# Patient Record
Sex: Female | Born: 1935 | Race: White | Hispanic: No | Smoking: Former smoker
Health system: Southern US, Community
[De-identification: ages and names within clinical notes are randomized; demographics above are authoritative.]

## PROBLEM LIST (undated history)

## (undated) DIAGNOSIS — I2510 Atherosclerotic heart disease of native coronary artery without angina pectoris: Secondary | ICD-10-CM

## (undated) DIAGNOSIS — H548 Legal blindness, as defined in USA: Secondary | ICD-10-CM

## (undated) DIAGNOSIS — J449 Chronic obstructive pulmonary disease, unspecified: Secondary | ICD-10-CM

## (undated) DIAGNOSIS — I252 Old myocardial infarction: Secondary | ICD-10-CM

## (undated) DIAGNOSIS — K7460 Unspecified cirrhosis of liver: Secondary | ICD-10-CM

## (undated) DIAGNOSIS — E785 Hyperlipidemia, unspecified: Secondary | ICD-10-CM

## (undated) DIAGNOSIS — G4730 Sleep apnea, unspecified: Secondary | ICD-10-CM

## (undated) DIAGNOSIS — R600 Localized edema: Secondary | ICD-10-CM

## (undated) DIAGNOSIS — E669 Obesity, unspecified: Secondary | ICD-10-CM

## (undated) DIAGNOSIS — W19XXXA Unspecified fall, initial encounter: Secondary | ICD-10-CM

## (undated) DIAGNOSIS — G5690 Unspecified mononeuropathy of unspecified upper limb: Secondary | ICD-10-CM

## (undated) DIAGNOSIS — E039 Hypothyroidism, unspecified: Secondary | ICD-10-CM

## (undated) HISTORY — PX: TUBAL LIGATION: SHX77

## (undated) HISTORY — PX: CARDIAC CATHETERIZATION: SHX172

## (undated) HISTORY — DX: Unspecified fall, initial encounter: W19.XXXA

## (undated) HISTORY — DX: Hyperlipidemia, unspecified: E78.5

## (undated) HISTORY — DX: Hypothyroidism, unspecified: E03.9

## (undated) HISTORY — DX: Old myocardial infarction: I25.2

## (undated) HISTORY — DX: Atherosclerotic heart disease of native coronary artery without angina pectoris: I25.10

## (undated) HISTORY — DX: Legal blindness, as defined in USA: H54.8

## (undated) HISTORY — PX: CHOLECYSTECTOMY: SHX55

## (undated) HISTORY — DX: Obesity, unspecified: E66.9

## (undated) HISTORY — DX: Sleep apnea, unspecified: G47.30

## (undated) HISTORY — DX: Localized edema: R60.0

## (undated) HISTORY — PX: CORONARY STENT PLACEMENT: SHX1402

## (undated) HISTORY — PX: WRIST SURGERY: SHX841

## (undated) HISTORY — DX: Unspecified mononeuropathy of unspecified upper limb: G56.90

## (undated) HISTORY — PX: ABDOMINAL HYSTERECTOMY: SHX81

---

## 1999-01-30 ENCOUNTER — Encounter: Payer: Self-pay | Admitting: Emergency Medicine

## 1999-01-30 ENCOUNTER — Inpatient Hospital Stay (HOSPITAL_COMMUNITY): Admission: EM | Admit: 1999-01-30 | Discharge: 1999-01-31 | Payer: Self-pay | Admitting: Emergency Medicine

## 1999-03-24 ENCOUNTER — Encounter (INDEPENDENT_AMBULATORY_CARE_PROVIDER_SITE_OTHER): Payer: Self-pay | Admitting: Specialist

## 1999-03-24 ENCOUNTER — Ambulatory Visit (HOSPITAL_COMMUNITY): Admission: RE | Admit: 1999-03-24 | Discharge: 1999-03-24 | Payer: Self-pay | Admitting: Gastroenterology

## 1999-03-29 ENCOUNTER — Ambulatory Visit (HOSPITAL_COMMUNITY): Admission: RE | Admit: 1999-03-29 | Discharge: 1999-03-29 | Payer: Self-pay | Admitting: Family Medicine

## 1999-03-29 ENCOUNTER — Encounter: Payer: Self-pay | Admitting: Family Medicine

## 1999-04-21 ENCOUNTER — Ambulatory Visit (HOSPITAL_COMMUNITY): Admission: RE | Admit: 1999-04-21 | Discharge: 1999-04-21 | Payer: Self-pay | Admitting: Gastroenterology

## 1999-04-21 ENCOUNTER — Encounter (INDEPENDENT_AMBULATORY_CARE_PROVIDER_SITE_OTHER): Payer: Self-pay

## 1999-05-12 ENCOUNTER — Encounter: Payer: Self-pay | Admitting: Family Medicine

## 1999-05-12 ENCOUNTER — Ambulatory Visit (HOSPITAL_COMMUNITY): Admission: RE | Admit: 1999-05-12 | Discharge: 1999-05-12 | Payer: Self-pay | Admitting: Family Medicine

## 2000-08-05 ENCOUNTER — Observation Stay (HOSPITAL_COMMUNITY): Admission: EM | Admit: 2000-08-05 | Discharge: 2000-08-05 | Payer: Self-pay | Admitting: Emergency Medicine

## 2000-08-05 ENCOUNTER — Encounter: Payer: Self-pay | Admitting: Cardiovascular Disease

## 2000-08-06 ENCOUNTER — Inpatient Hospital Stay (HOSPITAL_COMMUNITY): Admission: EM | Admit: 2000-08-06 | Discharge: 2000-08-08 | Payer: Self-pay | Admitting: Emergency Medicine

## 2000-08-06 ENCOUNTER — Encounter: Payer: Self-pay | Admitting: Emergency Medicine

## 2002-03-12 ENCOUNTER — Encounter: Payer: Self-pay | Admitting: Family Medicine

## 2002-03-12 ENCOUNTER — Encounter: Admission: RE | Admit: 2002-03-12 | Discharge: 2002-03-12 | Payer: Self-pay | Admitting: Family Medicine

## 2002-10-13 ENCOUNTER — Encounter: Payer: Self-pay | Admitting: Gastroenterology

## 2002-10-13 ENCOUNTER — Ambulatory Visit (HOSPITAL_COMMUNITY): Admission: RE | Admit: 2002-10-13 | Discharge: 2002-10-13 | Payer: Self-pay | Admitting: Gastroenterology

## 2002-11-12 ENCOUNTER — Other Ambulatory Visit: Admission: RE | Admit: 2002-11-12 | Discharge: 2002-11-12 | Payer: Self-pay | Admitting: Obstetrics and Gynecology

## 2002-12-22 ENCOUNTER — Ambulatory Visit (HOSPITAL_COMMUNITY): Admission: RE | Admit: 2002-12-22 | Discharge: 2002-12-22 | Payer: Self-pay | Admitting: Cardiovascular Disease

## 2004-11-13 ENCOUNTER — Emergency Department (HOSPITAL_COMMUNITY): Admission: EM | Admit: 2004-11-13 | Discharge: 2004-11-13 | Payer: Self-pay | Admitting: Emergency Medicine

## 2004-12-07 ENCOUNTER — Other Ambulatory Visit: Admission: RE | Admit: 2004-12-07 | Discharge: 2004-12-07 | Payer: Self-pay | Admitting: Obstetrics and Gynecology

## 2005-01-11 ENCOUNTER — Ambulatory Visit (HOSPITAL_COMMUNITY): Admission: RE | Admit: 2005-01-11 | Discharge: 2005-01-11 | Payer: Self-pay | Admitting: Endocrinology

## 2005-05-22 ENCOUNTER — Inpatient Hospital Stay (HOSPITAL_COMMUNITY): Admission: EM | Admit: 2005-05-22 | Discharge: 2005-05-23 | Payer: Self-pay | Admitting: Emergency Medicine

## 2005-10-09 ENCOUNTER — Ambulatory Visit: Payer: Self-pay | Admitting: Pulmonary Disease

## 2006-01-16 ENCOUNTER — Observation Stay (HOSPITAL_COMMUNITY): Admission: EM | Admit: 2006-01-16 | Discharge: 2006-01-17 | Payer: Self-pay | Admitting: Emergency Medicine

## 2006-04-03 DIAGNOSIS — I708 Atherosclerosis of other arteries: Secondary | ICD-10-CM | POA: Insufficient documentation

## 2006-07-05 ENCOUNTER — Encounter: Admission: RE | Admit: 2006-07-05 | Discharge: 2006-07-05 | Payer: Self-pay | Admitting: Family Medicine

## 2007-11-03 ENCOUNTER — Observation Stay (HOSPITAL_COMMUNITY): Admission: EM | Admit: 2007-11-03 | Discharge: 2007-11-04 | Payer: Self-pay | Admitting: Emergency Medicine

## 2007-12-31 ENCOUNTER — Ambulatory Visit (HOSPITAL_COMMUNITY)
Admission: RE | Admit: 2007-12-31 | Discharge: 2007-12-31 | Payer: Self-pay | Admitting: Physical Medicine and Rehabilitation

## 2008-03-04 ENCOUNTER — Ambulatory Visit: Payer: Self-pay | Admitting: Internal Medicine

## 2008-03-04 ENCOUNTER — Observation Stay (HOSPITAL_COMMUNITY): Admission: EM | Admit: 2008-03-04 | Discharge: 2008-03-06 | Payer: Self-pay | Admitting: Emergency Medicine

## 2008-03-29 ENCOUNTER — Ambulatory Visit: Payer: Self-pay | Admitting: Pulmonary Disease

## 2008-03-29 DIAGNOSIS — I5042 Chronic combined systolic (congestive) and diastolic (congestive) heart failure: Secondary | ICD-10-CM

## 2008-03-29 DIAGNOSIS — I5032 Chronic diastolic (congestive) heart failure: Secondary | ICD-10-CM | POA: Insufficient documentation

## 2008-04-06 ENCOUNTER — Encounter: Payer: Self-pay | Admitting: Pulmonary Disease

## 2008-04-06 ENCOUNTER — Ambulatory Visit: Admission: RE | Admit: 2008-04-06 | Discharge: 2008-04-06 | Payer: Self-pay | Admitting: Pulmonary Disease

## 2008-04-07 ENCOUNTER — Telehealth: Payer: Self-pay | Admitting: Pulmonary Disease

## 2008-08-04 ENCOUNTER — Telehealth (INDEPENDENT_AMBULATORY_CARE_PROVIDER_SITE_OTHER): Payer: Self-pay | Admitting: *Deleted

## 2008-08-20 ENCOUNTER — Ambulatory Visit: Payer: Self-pay | Admitting: Cardiology

## 2008-08-21 ENCOUNTER — Inpatient Hospital Stay (HOSPITAL_COMMUNITY): Admission: EM | Admit: 2008-08-21 | Discharge: 2008-08-25 | Payer: Self-pay | Admitting: Emergency Medicine

## 2008-08-23 ENCOUNTER — Encounter (INDEPENDENT_AMBULATORY_CARE_PROVIDER_SITE_OTHER): Payer: Self-pay | Admitting: Internal Medicine

## 2009-05-10 ENCOUNTER — Inpatient Hospital Stay (HOSPITAL_BASED_OUTPATIENT_CLINIC_OR_DEPARTMENT_OTHER): Admission: RE | Admit: 2009-05-10 | Discharge: 2009-05-10 | Payer: Self-pay | Admitting: Cardiovascular Disease

## 2009-11-04 ENCOUNTER — Encounter: Admission: RE | Admit: 2009-11-04 | Discharge: 2009-11-04 | Payer: Self-pay | Admitting: Family Medicine

## 2010-03-04 ENCOUNTER — Inpatient Hospital Stay (HOSPITAL_COMMUNITY): Admission: EM | Admit: 2010-03-04 | Discharge: 2010-03-07 | Payer: Self-pay | Admitting: Emergency Medicine

## 2010-08-02 ENCOUNTER — Ambulatory Visit: Payer: Self-pay | Admitting: Cardiovascular Disease

## 2010-09-10 ENCOUNTER — Encounter: Payer: Self-pay | Admitting: Family Medicine

## 2010-09-21 NOTE — Progress Notes (Signed)
Summary: results  Phone Note Call from Patient Call back at [phone]   Caller: Daughter teresa Call For: clance Summary of Call: need results of pft  Initial call taken by: Gustavus Bryant,  April 07, 2008 3:52 PM  Follow-up for Phone Call        please let daughter know the breathing studies showed no asthma or copd, and that her lungs are just a little smaller than normal because of her weight/obesity.  There is no intervention from a pulmonary standpoint to be done, and her mother needs close monitoring of her fluid. Follow-up by: Kathee Delton MD,  April 07, 2008 7:55 PM  Additional Follow-up for Phone Call Additional follow up Details #1::        spoke with daughter and relayed answer from Dr. Gwenette Greet - daughter understands and will take mother to primary doctor for fluid monitoring Jim Like  April 08, 2008 9:23 AM

## 2010-09-21 NOTE — Progress Notes (Signed)
Summary: fax result/ pft immediately- pt there now  Phone Note From Other Clinic   Caller: andrea at [facility] Call For: clance Summary of Call: need pft results faxed immediately. pt is there now. fax # [phone]. she is faxing a release form now (if this is needed) to the [phone] #.  Initial call taken by: Tivis Ringer,  August 04, 2008 10:35 AM  Follow-up for Phone Call        Faxed pft's to the fax number provided. Follow-up by: Vernie Murders,  August 04, 2008 10:49 AM

## 2010-09-21 NOTE — Assessment & Plan Note (Signed)
Summary: sick visit for dyspnea   Chief Complaint:  Pt is here for a sick visit.  Pt c/o increased sob with activity and at rest, sneezing, eyes are red, watery, and and itchy.  Symptoms started approx 3 weeks ago.  Pt was recently hospitalized at [facility]. Marland Kitchen  History of Present Illness: the patient is a 77 year old female who I have seen in the distant past for cough secondary to upper airway dysfunction. Her cough definitely improved with discontinuation of ACE inhibitor, treating reflux disease more aggressively, and adding a nonsedating antihistamine for her rhinitis. I have not seen the patient in over 2 years, and she comes in today for followup after a recent hospitalization for congestive heart failure. She was admitted for dyspnea and found to have cardiomegaly with small bilateral effusions. She improved significantly with diuresis. Unfortunately, the patient states that her weight has increased 6 pounds over the last 3-4 days. She also notes worsening lower extremity edema. I have been asked to see the patient in followup to decide whether she has a pulmonary process that is contributing to her dyspnea. The question has been raised whether she may have asthma, and she currently is on Advair for the last 2 weeks.     Current Allergies: ! PENICILLIN ! SULFA    Risk Factors:  Tobacco use:  never   Review of Systems      See HPI   Vital Signs:  Patient Profile:   77 Years Old Female Weight:      206.25 pounds O2 Sat:      95 % O2 treatment:    Room Air Temp:     97.6 degrees F oral Pulse rate:   65 / minute BP sitting:   128 / 68  (left arm) Cuff size:   regular  Vitals Entered By: Valrie Hart LPN (March 29, 8920 3:25 PM)             Is Patient Diabetic? Yes  Comments .Medications reviewed with patient Valrie Hart LPN  March 29, 1940 3:33 PM      Physical Exam  General:     obese female in no acute distress Lungs:     totally clear to auscultation  except decreased breath sounds Heart:     regular rate and rhythm, 2/6 systolic murmur Extremities:     1+ edema bilaterally      Impression & Recommendations:  Problem # 1:  DYSPNEA (ICD-786.05) I suspect the majority of the patient's dyspnea is due to congestive heart failure, obesity, and deconditioning. She has even gained 6 pounds over a very short period of time, and it appears mostly to be fluid. I've asked her to contact her primary care physician for treatment. I have reviewed her CT scan of her chest, and there is really no evidence for interstitial disease, but there does appear to be curly B. lines associated with edema and bilateral pleural effusions. I think it is also very unlikely that she has asthma, and I would like to discontinue her Advair and do full pulmonary function studies in the next one to 2 weeks. I've encouraged the patient to work aggressively on weight loss.  Medications Added to Medication List This Visit: 1)  Metformin Hcl 500 Mg Tabs (Metformin hcl) .... Take 2 tabs by mouth two times a day 2)  Klor-con M10 10 Meq Cr-tabs (Potassium chloride crys cr) .... Take 1 tab by mouth once daily 3)  Furosemide 40 Mg Tabs (Furosemide) .Marland KitchenMarland KitchenMarland Kitchen  Take 1 tablet by mouth once a day 4)  Metoprolol Tartrate 50 Mg Tabs (Metoprolol tartrate) .... Take 1/2 tab by mouth two times a day 5)  Nitroglycerin 0.4 Mg Subl (Nitroglycerin) .... Take as directed as needed 6)  Isosorbide Mononitrate Cr 30 Mg Xr24h-tab (Isosorbide mononitrate) .... Take 1 tablet by mouth once a day 7)  Aspirin Low Dose 81 Mg Tabs (Aspirin) .... Take 1 tablet by mouth once a day 8)  Lipitor 20 Mg Tabs (Atorvastatin calcium) .... Take 1 tablet by mouth once a day 9)  Lexapro 10 Mg Tabs (Escitalopram oxalate) .... Take 1 tablet by mouth once a day 10)  Fexofenadine Hcl 180 Mg Tabs (Fexofenadine hcl) .... Take 1 tablet by mouth once a day 11)  Detrol La 4 Mg Xr24h-cap (Tolterodine tartrate) .... Take 1 tablet by  mouth once a day 12)  Carvedilol 12.5 Mg Tabs (Carvedilol) .... Take 1 tablet by mouth two times a day 13)  Levothyroxine Sodium 125 Mcg Tabs (Levothyroxine sodium) .... Take 1 tablet by mouth once a day 14)  Advair Diskus 250-50 Mcg/dose Misc (Fluticasone-salmeterol) .... Inhale 1 puff two times a day 15)  Albuterol Sulfate (2.5 Mg/32m) 0.083% Nebu (Albuterol sulfate) .... Use 1 vial in nebulizer every 6 hours 16)  Lantus 100 Unit/ml Soln (Insulin glargine) .... Take as directed 17)  Novolog 100 Unit/ml Soln (Insulin aspart) .... Take as directed   Patient Instructions: 1)  stop advair now 2)  can use albuterol only if needed 3)  will schedule for breathing studies 4)  I will contact you when results available 5)  work on weight loss 6)  contact you primary md about fluid weight gain.   ]

## 2010-11-04 LAB — CK TOTAL AND CKMB (NOT AT ARMC)
CK, MB: 1.4 ng/mL (ref 0.3–4.0)
Relative Index: INVALID (ref 0.0–2.5)
Total CK: 34 U/L (ref 7–177)

## 2010-11-04 LAB — GLUCOSE, CAPILLARY
Glucose-Capillary: 100 mg/dL — ABNORMAL HIGH (ref 70–99)
Glucose-Capillary: 110 mg/dL — ABNORMAL HIGH (ref 70–99)
Glucose-Capillary: 110 mg/dL — ABNORMAL HIGH (ref 70–99)
Glucose-Capillary: 122 mg/dL — ABNORMAL HIGH (ref 70–99)
Glucose-Capillary: 132 mg/dL — ABNORMAL HIGH (ref 70–99)
Glucose-Capillary: 140 mg/dL — ABNORMAL HIGH (ref 70–99)
Glucose-Capillary: 145 mg/dL — ABNORMAL HIGH (ref 70–99)
Glucose-Capillary: 147 mg/dL — ABNORMAL HIGH (ref 70–99)
Glucose-Capillary: 161 mg/dL — ABNORMAL HIGH (ref 70–99)
Glucose-Capillary: 165 mg/dL — ABNORMAL HIGH (ref 70–99)
Glucose-Capillary: 182 mg/dL — ABNORMAL HIGH (ref 70–99)
Glucose-Capillary: 191 mg/dL — ABNORMAL HIGH (ref 70–99)
Glucose-Capillary: 255 mg/dL — ABNORMAL HIGH (ref 70–99)

## 2010-11-04 LAB — DIFFERENTIAL
Basophils Absolute: 0 10*3/uL (ref 0.0–0.1)
Basophils Relative: 0 % (ref 0–1)
Eosinophils Absolute: 0.1 10*3/uL (ref 0.0–0.7)
Eosinophils Relative: 2 % (ref 0–5)
Lymphocytes Relative: 11 % — ABNORMAL LOW (ref 12–46)
Lymphs Abs: 0.9 10*3/uL (ref 0.7–4.0)
Monocytes Absolute: 0.7 10*3/uL (ref 0.1–1.0)
Monocytes Relative: 8 % (ref 3–12)
Neutro Abs: 6.8 10*3/uL (ref 1.7–7.7)
Neutrophils Relative %: 79 % — ABNORMAL HIGH (ref 43–77)

## 2010-11-04 LAB — CARDIAC PANEL(CRET KIN+CKTOT+MB+TROPI)
CK, MB: 1.4 ng/mL (ref 0.3–4.0)
CK, MB: 1.7 ng/mL (ref 0.3–4.0)
Relative Index: INVALID (ref 0.0–2.5)
Relative Index: INVALID (ref 0.0–2.5)
Total CK: 34 U/L (ref 7–177)
Total CK: 41 U/L (ref 7–177)
Troponin I: 0.01 ng/mL (ref 0.00–0.06)
Troponin I: 0.02 ng/mL (ref 0.00–0.06)

## 2010-11-04 LAB — BASIC METABOLIC PANEL
BUN: 7 mg/dL (ref 6–23)
CO2: 28 mEq/L (ref 19–32)
Calcium: 9.5 mg/dL (ref 8.4–10.5)
Chloride: 101 mEq/L (ref 96–112)
Creatinine, Ser: 0.86 mg/dL (ref 0.4–1.2)
GFR calc Af Amer: >60 mL/min (ref >60)
GFR calc non Af Amer: >60 mL/min (ref >60)
Glucose, Bld: 193 mg/dL — ABNORMAL HIGH (ref 70–99)
Potassium: 4.1 mEq/L (ref 3.5–5.1)
Sodium: 138 mEq/L (ref 135–145)

## 2010-11-04 LAB — PROTIME-INR
INR: 1.02 (ref 0.00–1.49)
Prothrombin Time: 13.3 seconds (ref 11.6–15.2)

## 2010-11-04 LAB — CBC
HCT: 42 % (ref 36.0–46.0)
Hemoglobin: 14.2 g/dL (ref 12.0–15.0)
MCH: 29.7 pg (ref 26.0–34.0)
MCHC: 33.9 g/dL (ref 30.0–36.0)
MCV: 87.6 fL (ref 78.0–100.0)
Platelets: 103 10*3/uL — ABNORMAL LOW (ref 150–400)
RBC: 4.79 MIL/uL (ref 3.87–5.11)
RDW: 16.5 % — ABNORMAL HIGH (ref 11.5–15.5)
WBC: 8.6 10*3/uL (ref 4.0–10.5)

## 2010-11-04 LAB — MRSA PCR SCREENING: MRSA by PCR: NEGATIVE

## 2010-11-04 LAB — APTT: aPTT: 41 seconds — ABNORMAL HIGH (ref 24–37)

## 2010-11-04 LAB — TROPONIN I: Troponin I: 0.03 ng/mL (ref 0.00–0.06)

## 2010-11-24 LAB — POCT I-STAT GLUCOSE
Glucose, Bld: 144 mg/dL — ABNORMAL HIGH (ref 70–99)
Operator id: 221371

## 2010-11-30 ENCOUNTER — Other Ambulatory Visit: Payer: Self-pay | Admitting: *Deleted

## 2010-11-30 MED ORDER — CARVEDILOL 12.5 MG PO TABS: 12.5000 mg | ORAL_TABLET | Freq: Two times a day (BID) | ORAL | Status: DC

## 2010-12-04 LAB — COMPREHENSIVE METABOLIC PANEL
ALT: 34 U/L (ref 0–35)
ALT: 34 U/L (ref 0–35)
ALT: 36 U/L — ABNORMAL HIGH (ref 0–35)
ALT: 42 U/L — ABNORMAL HIGH (ref 0–35)
AST: 24 U/L (ref 0–37)
AST: 35 U/L (ref 0–37)
AST: 36 U/L (ref 0–37)
AST: 43 U/L — ABNORMAL HIGH (ref 0–37)
Albumin: 3.3 g/dL — ABNORMAL LOW (ref 3.5–5.2)
Albumin: 3.5 g/dL (ref 3.5–5.2)
Albumin: 3.5 g/dL (ref 3.5–5.2)
Albumin: 3.5 g/dL (ref 3.5–5.2)
Alkaline Phosphatase: 228 U/L — ABNORMAL HIGH (ref 39–117)
Alkaline Phosphatase: 228 U/L — ABNORMAL HIGH (ref 39–117)
Alkaline Phosphatase: 239 U/L — ABNORMAL HIGH (ref 39–117)
Alkaline Phosphatase: 268 U/L — ABNORMAL HIGH (ref 39–117)
BUN: 12 mg/dL (ref 6–23)
BUN: 20 mg/dL (ref 6–23)
BUN: 22 mg/dL (ref 6–23)
BUN: 26 mg/dL — ABNORMAL HIGH (ref 6–23)
CO2: 27 mEq/L (ref 19–32)
CO2: 28 mEq/L (ref 19–32)
CO2: 29 mEq/L (ref 19–32)
CO2: 30 mEq/L (ref 19–32)
Calcium: 8.4 mg/dL (ref 8.4–10.5)
Calcium: 8.9 mg/dL (ref 8.4–10.5)
Calcium: 9.1 mg/dL (ref 8.4–10.5)
Calcium: 9.1 mg/dL (ref 8.4–10.5)
Chloride: 101 mEq/L (ref 96–112)
Chloride: 93 mEq/L — ABNORMAL LOW (ref 96–112)
Chloride: 95 mEq/L — ABNORMAL LOW (ref 96–112)
Chloride: 98 mEq/L (ref 96–112)
Creatinine, Ser: 0.9 mg/dL (ref 0.4–1.2)
Creatinine, Ser: 0.98 mg/dL (ref 0.4–1.2)
Creatinine, Ser: 1 mg/dL (ref 0.4–1.2)
Creatinine, Ser: 1.13 mg/dL (ref 0.4–1.2)
GFR calc Af Amer: 57 mL/min — ABNORMAL LOW (ref >60)
GFR calc Af Amer: >60 mL/min (ref >60)
GFR calc Af Amer: >60 mL/min (ref >60)
GFR calc Af Amer: >60 mL/min (ref >60)
GFR calc non Af Amer: 47 mL/min — ABNORMAL LOW (ref >60)
GFR calc non Af Amer: 55 mL/min — ABNORMAL LOW (ref >60)
GFR calc non Af Amer: 56 mL/min — ABNORMAL LOW (ref >60)
GFR calc non Af Amer: >60 mL/min (ref >60)
Glucose, Bld: 232 mg/dL — ABNORMAL HIGH (ref 70–99)
Glucose, Bld: 252 mg/dL — ABNORMAL HIGH (ref 70–99)
Glucose, Bld: 256 mg/dL — ABNORMAL HIGH (ref 70–99)
Glucose, Bld: 371 mg/dL — ABNORMAL HIGH (ref 70–99)
Potassium: 3.4 mEq/L — ABNORMAL LOW (ref 3.5–5.1)
Potassium: 3.7 mEq/L (ref 3.5–5.1)
Potassium: 3.8 mEq/L (ref 3.5–5.1)
Potassium: 4.4 mEq/L (ref 3.5–5.1)
Sodium: 134 mEq/L — ABNORMAL LOW (ref 135–145)
Sodium: 134 mEq/L — ABNORMAL LOW (ref 135–145)
Sodium: 135 mEq/L (ref 135–145)
Sodium: 136 mEq/L (ref 135–145)
Total Bilirubin: 0.7 mg/dL (ref 0.3–1.2)
Total Bilirubin: 1 mg/dL (ref 0.3–1.2)
Total Bilirubin: 1 mg/dL (ref 0.3–1.2)
Total Bilirubin: 1.3 mg/dL — ABNORMAL HIGH (ref 0.3–1.2)
Total Protein: 7.1 g/dL (ref 6.0–8.3)
Total Protein: 7.5 g/dL (ref 6.0–8.3)
Total Protein: 7.6 g/dL (ref 6.0–8.3)
Total Protein: 8 g/dL (ref 6.0–8.3)

## 2010-12-04 LAB — GLUCOSE, CAPILLARY
Glucose-Capillary: 112 mg/dL — ABNORMAL HIGH (ref 70–99)
Glucose-Capillary: 179 mg/dL — ABNORMAL HIGH (ref 70–99)
Glucose-Capillary: 229 mg/dL — ABNORMAL HIGH (ref 70–99)
Glucose-Capillary: 244 mg/dL — ABNORMAL HIGH (ref 70–99)
Glucose-Capillary: 246 mg/dL — ABNORMAL HIGH (ref 70–99)
Glucose-Capillary: 257 mg/dL — ABNORMAL HIGH (ref 70–99)
Glucose-Capillary: 262 mg/dL — ABNORMAL HIGH (ref 70–99)
Glucose-Capillary: 268 mg/dL — ABNORMAL HIGH (ref 70–99)
Glucose-Capillary: 278 mg/dL — ABNORMAL HIGH (ref 70–99)
Glucose-Capillary: 318 mg/dL — ABNORMAL HIGH (ref 70–99)
Glucose-Capillary: 321 mg/dL — ABNORMAL HIGH (ref 70–99)
Glucose-Capillary: 328 mg/dL — ABNORMAL HIGH (ref 70–99)
Glucose-Capillary: 359 mg/dL — ABNORMAL HIGH (ref 70–99)
Glucose-Capillary: 360 mg/dL — ABNORMAL HIGH (ref 70–99)
Glucose-Capillary: 362 mg/dL — ABNORMAL HIGH (ref 70–99)
Glucose-Capillary: 370 mg/dL — ABNORMAL HIGH (ref 70–99)
Glucose-Capillary: 371 mg/dL — ABNORMAL HIGH (ref 70–99)
Glucose-Capillary: 372 mg/dL — ABNORMAL HIGH (ref 70–99)
Glucose-Capillary: 381 mg/dL — ABNORMAL HIGH (ref 70–99)
Glucose-Capillary: 413 mg/dL — ABNORMAL HIGH (ref 70–99)
Glucose-Capillary: 81 mg/dL (ref 70–99)

## 2010-12-04 LAB — CBC
HCT: 36.5 % (ref 36.0–46.0)
HCT: 38.8 % (ref 36.0–46.0)
HCT: 44.4 % (ref 36.0–46.0)
HCT: 44.9 % (ref 36.0–46.0)
Hemoglobin: 11.9 g/dL — ABNORMAL LOW (ref 12.0–15.0)
Hemoglobin: 12.9 g/dL (ref 12.0–15.0)
Hemoglobin: 14.7 g/dL (ref 12.0–15.0)
Hemoglobin: 14.8 g/dL (ref 12.0–15.0)
MCHC: 32.5 g/dL (ref 30.0–36.0)
MCHC: 32.7 g/dL (ref 30.0–36.0)
MCHC: 33.3 g/dL (ref 30.0–36.0)
MCHC: 33.4 g/dL (ref 30.0–36.0)
MCV: 85.2 fL (ref 78.0–100.0)
MCV: 86.7 fL (ref 78.0–100.0)
MCV: 86.8 fL (ref 78.0–100.0)
MCV: 87.9 fL (ref 78.0–100.0)
Platelets: 144 10*3/uL — ABNORMAL LOW (ref 150–400)
Platelets: 162 10*3/uL (ref 150–400)
Platelets: 175 10*3/uL (ref 150–400)
Platelets: 178 10*3/uL (ref 150–400)
RBC: 4.16 MIL/uL (ref 3.87–5.11)
RBC: 4.47 MIL/uL (ref 3.87–5.11)
RBC: 5.17 MIL/uL — ABNORMAL HIGH (ref 3.87–5.11)
RBC: 5.21 MIL/uL — ABNORMAL HIGH (ref 3.87–5.11)
RDW: 16.6 % — ABNORMAL HIGH (ref 11.5–15.5)
RDW: 16.7 % — ABNORMAL HIGH (ref 11.5–15.5)
RDW: 16.9 % — ABNORMAL HIGH (ref 11.5–15.5)
RDW: 16.9 % — ABNORMAL HIGH (ref 11.5–15.5)
WBC: 10.1 10*3/uL (ref 4.0–10.5)
WBC: 12.3 10*3/uL — ABNORMAL HIGH (ref 4.0–10.5)
WBC: 12.3 10*3/uL — ABNORMAL HIGH (ref 4.0–10.5)
WBC: 8.9 10*3/uL (ref 4.0–10.5)

## 2010-12-04 LAB — DIFFERENTIAL
Basophils Absolute: 0 10*3/uL (ref 0.0–0.1)
Basophils Relative: 1 % (ref 0–1)
Eosinophils Absolute: 0.2 10*3/uL (ref 0.0–0.7)
Eosinophils Relative: 2 % (ref 0–5)
Lymphocytes Relative: 13 % (ref 12–46)
Lymphs Abs: 1.1 10*3/uL (ref 0.7–4.0)
Monocytes Absolute: 0.6 10*3/uL (ref 0.1–1.0)
Monocytes Relative: 7 % (ref 3–12)
Neutro Abs: 7 10*3/uL (ref 1.7–7.7)
Neutrophils Relative %: 78 % — ABNORMAL HIGH (ref 43–77)

## 2010-12-04 LAB — BASIC METABOLIC PANEL
BUN: 20 mg/dL (ref 6–23)
CO2: 26 mEq/L (ref 19–32)
Calcium: 8.9 mg/dL (ref 8.4–10.5)
Chloride: 95 mEq/L — ABNORMAL LOW (ref 96–112)
Creatinine, Ser: 1.15 mg/dL (ref 0.4–1.2)
GFR calc Af Amer: 56 mL/min — ABNORMAL LOW (ref >60)
GFR calc non Af Amer: 46 mL/min — ABNORMAL LOW (ref >60)
Glucose, Bld: 244 mg/dL — ABNORMAL HIGH (ref 70–99)
Potassium: 4.4 mEq/L (ref 3.5–5.1)
Sodium: 131 mEq/L — ABNORMAL LOW (ref 135–145)

## 2010-12-04 LAB — CK TOTAL AND CKMB (NOT AT ARMC)
CK, MB: 2.6 ng/mL (ref 0.3–4.0)
CK, MB: 2.8 ng/mL (ref 0.3–4.0)
Relative Index: INVALID (ref 0.0–2.5)
Relative Index: INVALID (ref 0.0–2.5)
Total CK: 73 U/L (ref 7–177)
Total CK: 81 U/L (ref 7–177)

## 2010-12-04 LAB — CK
Total CK: 124 U/L (ref 7–177)
Total CK: 46 U/L (ref 7–177)
Total CK: 52 U/L (ref 7–177)
Total CK: 63 U/L (ref 7–177)

## 2010-12-04 LAB — TROPONIN I
Troponin I: 0.01 ng/mL (ref 0.00–0.06)
Troponin I: 0.02 ng/mL (ref 0.00–0.06)

## 2010-12-04 LAB — PHOSPHORUS
Phosphorus: 3.8 mg/dL (ref 2.3–4.6)
Phosphorus: 4.2 mg/dL (ref 2.3–4.6)

## 2010-12-04 LAB — BLOOD GAS, ARTERIAL
Acid-Base Excess: 3.9 mmol/L — ABNORMAL HIGH (ref 0.0–2.0)
Bicarbonate: 28.3 mEq/L — ABNORMAL HIGH (ref 20.0–24.0)
FIO2: 0.21 %
O2 Saturation: 90 %
Patient temperature: 98.6
TCO2: 29.7 mmol/L (ref 0–100)
pCO2 arterial: 46.4 mmHg — ABNORMAL HIGH (ref 35.0–45.0)
pH, Arterial: 7.403 — ABNORMAL HIGH (ref 7.350–7.400)
pO2, Arterial: 59.2 mmHg — ABNORMAL LOW (ref 80.0–100.0)

## 2010-12-04 LAB — TSH: TSH: 1.686 u[IU]/mL (ref 0.350–4.500)

## 2010-12-04 LAB — MAGNESIUM: Magnesium: 2.4 mg/dL (ref 1.5–2.5)

## 2010-12-04 LAB — BRAIN NATRIURETIC PEPTIDE: Pro B Natriuretic peptide (BNP): 111 pg/mL — ABNORMAL HIGH (ref 0.0–100.0)

## 2010-12-04 LAB — HEMOGLOBIN A1C
Hgb A1c MFr Bld: 7.3 % — ABNORMAL HIGH (ref 4.6–6.1)
Mean Plasma Glucose: 163 mg/dL

## 2010-12-04 LAB — LIPASE, BLOOD: Lipase: 55 U/L (ref 11–59)

## 2010-12-04 LAB — CALCIUM: Calcium: 8.7 mg/dL (ref 8.4–10.5)

## 2011-01-02 NOTE — H&P (Signed)
[name], [name]                  ACCOUNT NO.:  [id number]   MEDICAL RECORD NO.:  [id number]          PATIENT TYPE:  INP   LOCATION:  4705                         FACILITY:  [facility]   PHYSICIAN:  Jana Hakim, M.D. DATE OF BIRTH:  10/21/1933   DATE OF ADMISSION:  08/20/2008  DATE OF DISCHARGE:                              HISTORY & PHYSICAL   PRIMARY CARE PHYSICIAN:  Unassigned.  Physician is in [place], [place].   CHIEF COMPLAINT:  Shortness of breath.   HISTORY OF PRESENT ILLNESS:  This is a 77 year old female who presents  to the emergency department with complaints of worsening shortness of  breath over the past 3 weeks.  She states that she has severe dyspnea on  exertion and also reports having orthopnea.  Denies having any edema and  denies having any chest pain.  She reports having a pulmonary workup  which included pulmonary function testing performed and was diagnosed  with restrictive lung disease and was placed on oxygen continuously.  However, she reports continuing to decline and suffers from shortness of  breath.  The patient reports that her symptoms have worsened more so  over the past 3 days.  She denies having any fevers, chills or  congestion.  She denies having any nausea, vomiting, diarrhea, bowel or  bladder changes.  Denies having any myalgias or arthralgias.  She also  denies having any lightheadedness, syncope or presyncope or seizures  symptoms.  She denies changes in her appetite.   PAST MEDICAL HISTORY:  Significant for:  1. Reactive airway disease, reported history of restrictive lung      disease.  2. History of coronary artery disease status post PTCA with stent      placement in the RCA in 1996.  3. History of congestive heart failure syndrome.  4. History of type 2 diabetes mellitus.  5. Hypothyroidism.  6. Hyperlipidemia.  7. Lumbar back pain.  8. Hypertension.  9. Anxiety depression.  10.Morbid obesity.  11.Cholecystectomy  secondary to gallstones  12.Partial hysterectomy.   Her medications include:  1. Lantus insulin 40 units subcutaneous daily.  2. NovoLog 20 units t.i.d. with meals.  3. Aspirin 81 mg p.o. daily.  4. Coreg 12.5 mg p.o. b.i.d.  5. Metoprolol 50 mg p.o. b.i.d.  6. Lipitor 10 mg p.o. daily.  7. Nexium 40 mg p.o. daily.  8. Potassium chloride 10 mEq p.o. daily.  9. Allegra 180 mg p.o. daily.  10.Lexapro 10 mg p.o. daily.  11.Potassium chloride 10 mEq p.o. daily.  12.Alprazolam 0.5 mg p.o. b.i.d. p.r.n.  13.Isosorbide mononitrate 30 mg p.o. daily.  14.Levothyroxine 125 mcg p.o. daily.   ALLERGIES:  TO PENICILLIN, SULFA AND MOBIC.   SOCIAL HISTORY:  The patient lives alone.  Her husband is in a nursing  home facility at this time.  She has a remote history of tobacco usage,  quit 35 years ago, and no history of alcohol usage.   FAMILY HISTORY:  Noncontributory.   REVIEW OF SYSTEMS:  Pertinents are mentioned above.   PHYSICAL EXAMINATION FINDINGS:  This is a 77 year old  morbidly obese  female in discomfort but no acute distress.  VITAL SIGNS:  Temperature 98.2, blood pressure initially 140/67, and it  decreased to 100/42, heart rate 65, respirations 20, O2 saturations 98%.  HEENT EXAMINATION:  Normocephalic, atraumatic.  Pupils equally round,  reactive to light.  Extraocular movements are intact.  Funduscopic  benign.  There is no scleral icterus or conjunctival injection.  Nares  are patent bilaterally.  Oropharynx is clear.  NECK:  Is supple.  Full range of motion.  No thyromegaly, adenopathy,  jugular venous distention.  CARDIOVASCULAR:  Regular rate and rhythm.  No murmurs, gallops or rubs.  LUNGS:  Clear.  But decreased.  No rales, rhonchi or wheezes  appreciated.  ABDOMEN:  Positive bowel sounds, soft, nontender, nondistended.  EXTREMITIES:  Without cyanosis, clubbing or edema.  NEUROLOGIC EXAMINATION:  Nonfocal.  The patient is alert and oriented  x3.  No motor or sensory  deficits.  Cranial nerves are intact.   LABORATORY STUDIES:  White blood cell count 8.9, hemoglobin 11.9,  hematocrit 36.5, platelets 144, neutrophils 78% lymphocytes 13%.  Sodium  135, potassium 3.7, chloride 101, carbon dioxide 27, BUN 12.  Creatinine  0.90 and glucose 232.  Beta natriuretic peptide 111.  Cardiac markers  with a total creatinine kinase of 81, CK-MB 2.8, relative index invalid  secondary to a CK less than 100, and troponin 0.01.  Albumin 3.3, AST  43.  Chest x-ray reveals cardiac enlargement, peribronchial thickening  and increased interstitial markings suggestive of interstitial edema or  interstitial pneumonitis.   ASSESSMENT:  A 77 year old female being admitted with:  1. Shortness of breath.  2. Chronic obstructive pulmonary disease/asthma exacerbation.  3. Type 2 diabetes mellitus with hyperglycemia.  4. Coronary artery disease.  5. Morbid obesity.   PLAN:  The patient will be admitted and placed on an IV steroid taper  along with nebulizer treatments.  Her regular medications will be  further verified, and sliding scale insulin coverage has also been  ordered for elevated blood sugars.  Cardiac enzymes will also be  performed.  Further workup will ensue pending results of the patient's  clinical course and her laboratory studies.  DVT and GI prophylaxis have  also been ordered.      Jana Hakim, M.D.  Electronically Signed     HJ/MEDQ  D:  08/21/2008  T:  08/21/2008  Job:  [id number]

## 2011-01-02 NOTE — Discharge Summary (Signed)
NAMESCARLETT, [name]                  ACCOUNT NO.:  [id number]   MEDICAL RECORD NO.:  [id number]          PATIENT TYPE:  OBV   LOCATION:  2004                         FACILITY:  [facility]   PHYSICIAN:  Thayer Headings, M.D. DATE OF BIRTH:  03-13-1934   DATE OF ADMISSION:  11/03/2007  DATE OF DISCHARGE:  11/04/2007                               DISCHARGE SUMMARY   DISCHARGE DIAGNOSES:  1. Noncardiac chest pain.  2. History of coronary artery disease.  3. Diabetes mellitus.  4. Hypothyroidism.  5. Dyslipidemia.   DISCHARGE MEDICATIONS:  1. Potassium chloride 10 mEq a day.  2. Nexium 40 mg a day.  3. Lasix 40 mg a day.  4. Carvedilol 12.5 mg p.o. b.i.d.  5. Isosorbide mononitrate 60 mg a day.  6. Levoxyl 125 mcg a day.  7. Aspirin 81 mg a day.  8. Lantus insulin 80 units at night with 40 units in the morning.  9. NovoLog 15 units in the morning and at lunch and 25 units at night.  10.Lexapro 10 mg a day.  11.Lipitor 10 mg a day.  12.Flexeril 10 mg 3 times a day as needed for muscle spasm.  13.Darvocet-N 100 1-2 tablets 3 times a day as needed for pain.   DISPOSITION:  The patient already has an appointment with Dr. Acie Fredrickson on  November 14, 2007.  She is to see [name] for further management of  diabetes.  She is to see Cyndi Bender for further management of her  noncardiac chest pain.   HISTORY:  [name] is a 77 year old female with history of coronary  artery disease, diabetes mellitus, hyperlipidemia, obesity, and  hypothyroidism.  She was admitted for observation for episodes of chest  pain.  Please see dictated H and P for further details.   HOSPITAL COURSE BY PROBLEMS:  1. Chest pain.  The patient is ruled out for myocardial infarction      with serial CPKs.  She had nonacute EKG.  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  The patient's pain seems to be musculoskeletal      by physical exam.  She has  gotten a little bit of relief with pain      medications.  We will send her home on some Darvocet and Flexeril,      and she will follow up with Tobie Lords and with Dr. Acie Fredrickson.  2. Coronary artery disease.  We will likely need to do a  stress      Cardiolite study in the near future.  We will schedule that as an      outpatient.  3. Diabetes mellitus.  The patient comments that her glucose levels      have been poorly controlled.  Her last hemoglobin A1c was 8.  She      has a hemoglobin A1c that is pending this morning.   We will change her metoprolol to carvedilol to see if that helps with  her glucose control.  All of her other medical problems remained stable.           ______________________________  Thayer Headings, M.D.     PJN/MEDQ  D:  11/04/2007  T:  11/05/2007  Job:  [id number]   cc:   Jacelyn Pi, M.D.  Cyndi Bender, PA

## 2011-01-02 NOTE — Discharge Summary (Signed)
NAME:  ELEISHA, BRANSCOMB                  ACCOUNT NO.:  [id number]   MEDICAL RECORD NO.:  [id number]            PATIENT TYPE:   LOCATION:                                 FACILITY:   PHYSICIAN:  Girard Cooter, MD              DATE OF BIRTH:   DATE OF ADMISSION:  DATE OF DISCHARGE:                               DISCHARGE SUMMARY   Additional medications include NovoLog subcu 6 units with meals 3 times  daily.      Girard Cooter, MD     RR/MEDQ  D:  08/25/2008  T:  08/25/2008  Job:  [id number]

## 2011-01-02 NOTE — H&P (Signed)
NAME:  Madison, Hogan NO.:  [id number]   MEDICAL RECORD NO.:  [id number]          PATIENT TYPE:  EMS   LOCATION:  MAJO                         FACILITY:  [facility]   PHYSICIAN:  Thayer Headings, M.D. DATE OF BIRTH:  1934-03-18   DATE OF ADMISSION:  11/03/2007  DATE OF DISCHARGE:                              HISTORY & PHYSICAL   HISTORY OF PRESENT ILLNESS:  [name] is a 77 year old female with a  history of coronary artery disease, diabetes mellitus, dyslipidemia,  hypothyroidism and obesity who is admitted with progressive dyspnea on  exertion and a one day history of chest pain.   The patient has a long history of coronary artery disease.  She is  status post PTCA of her right coronary artery in 1996 when she presented  with an inferior wall myocardial infarction.  She has had several heart  catheterization since that time which have not revealed any critical  lesions.  She has had a chronically occluded ramus intermediate branch  that fills via collaterals,but this remained unchanged throughout the  years.   She was last seen in our office in May.  Since that time, she has been  working hard to take care of her husband who has become somewhat of an  invalid.  He has had a series of strokes and really cannot live  independently.  She has been doing a lot of lifting and pulling on him.  She has not been able to exercise.   She reports a 2-3 week history of progressive shortness of breath,  especially with exertion.  She denies any PND, orthopnea.  She is  completely out of breath when she tries to walk up stairs or walk any  distance.  This has been fairly stable.  She eats out a fair amount.  She does not really pay attention to her diet intake.   Yesterday morning she woke with some left upper chest pain with some  tingling and numbness in her right hand.  The pain did not resolve.  She  presented today for further evaluation to the emergency room.   CURRENT MEDICATIONS:  1. Imdur 60 mg a day.  2. Metoprolol 50 mg p.o. b.i.d.  3. Furosemide 40 mg a day.  4. Aspirin 325 mg a day.  5. Potassium chloride 10 mg a day.  6. Lipitor 10 mg a day.  7. Nexium 40 mg once a day.  8. Synthroid 125 mcg a day.  9. Lexapro 10 mg a day.  10.Lantus insulin 40 units in the morning, 80 units in the evening.  11.NovoLog 15 units in the morning, 15 units at lunch and 25 units at      dinnertime.  12.Albuterol metered-dose inhaler twice a day.  13.Oxytrol Patch once a day.   ALLERGIES:  PENICILLIN AND SULFA.  SHE IS INTOLERANT TO ACE INHIBITORS  WHICH CAUSE COUGH.   PAST MEDICAL HISTORY:  1. Coronary artery disease.  She is status post PTCA of her right      coronary artery in 1996.  2. Diabetes mellitus - poorly  controlled.  Her last hemoglobin A1c was      around 8.  3. Hypothyroidism.  4. Hyperlipidemia.  5. Obesity.  6. Asthma/COPD.   SOCIAL HISTORY:  The patient is a nonsmoker and nondrinker.   FAMILY HISTORY:  Noncontributory.   REVIEW OF SYSTEMS:  Reviewed and is essentially negative except as noted  in the HPI.   PHYSICAL EXAMINATION:  GENERAL:  She is an elderly female in no acute  distress.  She is alert and oriented x3.  Mood and affect are normal.  VITAL SIGNS:  Her blood pressure is 113/60 with heart rate of 66,  respirations of 20.  HEENT:  Reveals 2+ carotids, no bruits, no JVD, no thyromegaly.  LUNGS: Clear to auscultation.  HEART:  Regular rate S1-S2.  ABDOMEN:  Reveals good bowel sounds and is nontender.  EXTREMITIES:  She has no clubbing, cyanosis or edema.  She has good  pulses.  NEUROLOGICAL:  Reveals cranial nerves II-XII intact.  Motor and sensory  function intact.   STUDIES:  EKG reveals normal sinus rhythm.  She has an incomplete right  bundle branch block.  She has an old inferior wall myocardial infarction  which is unchanged from previous tracings.   LABORATORY DATA:  White blood cell count is 8.4,  hemoglobin is 14.3,  hematocrit 42.5, INR is 1.0.  First troponin is less than 0.05.  Creatinine is 1.0.  Sodium is 137, potassium is 3.8, chlorides 104.  BUN  is 11 and glucose is 160.   IMPRESSION:  [name] presents with an episodes of chest pain.  I  suspect that this is noncardiac.  It sounds like a musculoskeletal pain  to me.  I do think that because of her multiple risk factors and the  fact that she has had coronary artery disease that we should keep her  for observation.  We will collect cardiac enzymes and do an EKG in the  morning.  If she will remain stable, then we will be able to discharge  her.  I have discontinued the heparin and nitro for now.   It is somewhat worse that she is having progressive shortness of breath  with exertion.  We will plan on getting an echocardiogram as an  outpatient.  I have asked her to really get serious with her exercise  program.  She is quite  obese and she has poorly controlled diabetes mellitus.  I suspect that  this has a lot to do with her problems.  We had a long discussion  regarding a good exercise program that she can start as an outpatient.  All of her other medical problems remain fairly stable.           ______________________________  Thayer Headings, M.D.     PJN/MEDQ  D:  11/03/2007  T:  11/04/2007  Job:  [id number]   cc:   Jacelyn Pi, M.D.  Cyndi Bender

## 2011-01-02 NOTE — Discharge Summary (Signed)
[name], [name]                  ACCOUNT NO.:  [id number]   MEDICAL RECORD NO.:  [id number]          PATIENT TYPE:  INP   LOCATION:  4705                         FACILITY:  [facility]   PHYSICIAN:  Girard Cooter, MD         DATE OF BIRTH:  08/13/1934   DATE OF ADMISSION:  08/20/2008  DATE OF DISCHARGE:  08/25/2008                               DISCHARGE SUMMARY   DISCHARGE DIAGNOSIS:  Dyspnea; likely multifactorial in etiology with a  component of obesity hypoventilation syndrome, ruling out obstructive  sleep apnea (the patient is yet to get outpatient sleep study but  scheduled).  Also, secondary to mild left ventricular systolic  dysfunction.  Hypokinesis in the inferior wall.  Ejection fraction noted  to be low normal.   PROCEDURES:  1. Patient had a 2D echocardiogram dated August 23, 2008, results      above.  2. Abdominal ultrasound shows prior previous cholecystectomy and      diffuse fatty infiltration; this was dated August 23, 2008.  3. She also had a chest x-ray on August 20, 2008, which showed cardiac      enlargement, peribronchial thickening, and increased interstitial      markings suggestive of interstitial edema.  Interstitial      pneumonitis is also a possibility.   BRIEF HISTORY OF PRESENT ILLNESS:  [name] is a 77 year old female who  presented to the emergency room with shortness of breath.  Patient had  no chest pain.  Shortness of breath x3 weeks on and off.  Patient  reported having pulmonary workup in the past with pulmonary function  tests.  Patient resides in [place] and gets her primary care in  [place], [place].  Patient supposedly was placed on oxygen  continuously.  She was admitted to the telemetry floor and put on  monitor.  She did not show any arrhythmias such as atrial fibrillation  or tachycardia or bradycardias during this admission.  It was thought  that her shortness of breath was likely multifactorial with  contributions from mild  CHF with ejection fraction of 45% via a  catheterization that was revealed in the past, a questionable obesity  hypoventilation, vocal cord spasm, and anxiety.  Patient was given  overnight CPAP which resolved some of her symptoms.  Patient was  initiated on gentle diuresis with Lasix and titrated down appropriately.   HOSPITAL COURSE:  1. Shortness of breath as above, evaluated, thought to be      multifactorial in etiology secondary to obesity hypoventilation      syndrome given that she had decrease in pulse oximetry during      sleep.  Patient will be scheduled for outpatient polysomnogram to      rule out concomitant obstructive sleep apnea.  It was also thought      that her dyspnea was secondary to her mild systolic dysfunction      with ejection fraction of 45%.  Patient is going to have an      outpatient sleep study on Saturday and scheduled already.  Patient  is recommended to continue her outpatient continuous oxygen and      follow up with her primary care physician in [place].  2. Diabetes, type 2, uncontrolled with a hemoglobin A1c of 7.2.      Lantus was adjusted, as was the sliding scale insulin with      bettering of her control.  Patient's diabetes was also managed with      getting consultation from diabetic care coordinator and her regimen      was adjusted.  3. Coronary artery disease.  Patient's cardiac enzymes remained normal      and her EKG remained unchanged.  4. Hypothyroidism, stable.  Patient was continued on Synthroid.  5. Hyperlipidemia, stable.  6. Hypertension, stable.  7. Morbid obesity.  Patient advised on diet and exercise as much as      she can tolerate.  8. Increase in her LFTs.  Patient received an abdominal ultrasound      done on August 23, 2008, which showed a previous cholecystectomy      but no evidence of biliary dilatation.  There was diffuse fatty      infiltration of the liver thus increase in her LFTs is thought to      be  secondary to nonalcoholic steatohepatitis and fatty liver      disease.  9. Chronic obstructive pulmonary disease.  Patient is to remain on      continuous oxygen.  Patient was given no prednisone throughout this      admission.   DISCHARGE LABORATORY DATA:  Includes a complete metabolic panel which  shows a sodium of 134, potassium is 3.8, CO2 of 29, glucose 256, BUN 26,  creatinine 1.0, magnesium is 2.4, phosphorus is 4.2.  CBC normal.  Hemoglobin 14.8, hematocrit 44.4, platelets 175.   DISCHARGE MEDICATIONS:  Include:  1. Lantus is going to be changed to 75 units subcu q.h.s.  Note, this      is a change from her home dose of 40 subcu q.h.s.  Patient advised      to hold for glucose less than 100.  2. Levothyroxine 125 mcg p.o. daily as per previous dose.  3. Lexapro 10 mg p.o. daily.  4. Coreg 12.5 mg p.o. b.i.d., hold for systolic blood pressure less      than 100 or heart rate less than 60.  Patient advised of      recommendations.  5. Lipitor will be held secondary to increase in LFTs.  6. Lasix 40 mg p.o. daily.  7. Potassium chloride 10 mEq p.o. daily.  8. Nexium 40 mg p.o. daily.  9. Allegra 180 mg p.o. daily.  10.Alprazolam 0.5 mg twice a day as needed.  11.Albuterol and Atrovent nebulizer treatments every 4 hours and every      2 hours as needed.  12.Continuous home oxygen.  13.CPAP at 5 mmHg at night.  Note, this should be adjusted per sleep      study and titration thereafter.      Girard Cooter, MD  Electronically Signed     RR/MEDQ  D:  08/25/2008  T:  08/25/2008  Job:  [id number]

## 2011-01-05 NOTE — Cardiovascular Report (Signed)
[place]  Patient:    [name], [name]                         MRN: [id number] Proc. Date: 08/05/00 Adm. Date:  [id number] Disc. Date: [id number] Attending:  Randa Spike CC:         Juanda Bond. Altheimer, M.D.   Cardiac Catheterization  INDICATIONS:  [name] is a 77 year old female, with a history of coronary artery disease and an inferior wall myocardial infarction.  She presented to the ER with episodes of chest pain.  She has had a week of exertional chest pain and shortness of breath.  She is referred for heart catheterization for further evaluation.  DESCRIPTION OF PROCEDURE:  The right femoral artery was easily cannulated using the modified Seldinger technique.  HEMODYNAMICS:  The LV pressure is 93/79 with an aortic of 92/49.  ANGIOGRAPHY:  The left main coronary artery is essentially normal.  The left anterior descending artery has mild irregularities.  There is a 30% stenosis in the proximal segment right at the origin of the first diagonal vessel.  The remainder of the LAD has only minor luminal irregularities. There is brisk flow down the LAD.  The first diagonal vessel is a fairly large vessel.  There is a 20-30% stenosis in the proximal aspect.  The remainder of the first diagonal vessel is normal.  The ramus intermediate branch seen best in the spider shot.  The ramus intermediate branch is completely occluded and fills right to left collaterals.  This has been occluded for the past four to five years.  The left circumflex artery is a moderate sized vessel.  It gives off a moderate sized obtuse marginal artery.  There are only minor luminal irregularities and essentially the circumflex artery is normal.  The right coronary artery is a moderate sized vessel and is dominant.  The proximal stented segment is unremarkable.  There is mild 20% re-stenosis of this segment but the flow through there is quite brisk.  This  segment is basically unchanged from her previous heart catheterization.  The posterior descending artery and the posterolateral segment artery are unremarkable.  LEFT VENTRICULOGRAM:  The left ventriculogram was performed in a 30 RAO position.  It reveals a fairly well preserved left ventricular systolic function.  There is akinesis of the inferior base with hypokinesis involving the inferior wall.  The remaining wall seemed to contract fairly normally. The ejection fraction is 50-60%.  There is trace mitral regurgitation.  COMPLICATIONS:  None.  CONCLUSIONS:  Mild to moderate coronary artery disease but with no critical stenoses. DD:  08/05/00 TD:  08/06/00 Job: [id number]

## 2011-01-05 NOTE — Cardiovascular Report (Signed)
NAMECICELY, [name]                  ACCOUNT NO.:  [id number]   MEDICAL RECORD NO.:  [id number]          PATIENT TYPE:  INP   LOCATION:  4729                         FACILITY:  [facility]   PHYSICIAN:  Thayer Headings, M.D. DATE OF BIRTH:  1934/07/02   DATE OF PROCEDURE:  01/16/2006  DATE OF DISCHARGE:                              CARDIAC CATHETERIZATION   HISTORY:  [name] is a 77 year old female with a history of coronary  artery disease, diabetes mellitus, obesity, who was admitted with substernal  chest pain and EKG changes.  She was referred for heart catheterization  based on these findings.   The right femoral artery was easily cannulated using a modified Seldinger  technique.   HEMODYNAMICS:  LV pressure was 136/27 with an aortic pressure of 134/65.   Angiography, left main.  The left main is fairly normal.   The left anterior descending artery has mild irregularities in the proximal  segment between 20% and 25%.  There is a large first diagonal artery which  has only minor luminal irregularities.  The remainder of the LAD has minimal  irregularities.   The ramus intermediate branch was occluded proximally.  This is unchanged  from most of her previous heart catheterizations.  The distal ramus  intermediate branch fills via collateral filling from the right coronary  artery.   A left circumflex artery is a moderate to large vessel.  There are minor  luminal irregularities.  There is a large first obtuse marginal artery which  is normal.  The distal circumflex artery is normal.   The right coronary artery is large and dominant.  There is a mid 25-30%  stenosis.  The posterior descending artery and the posterolateral segment  artery are both normal.   A left ventriculogram was performed in the 30 RAO position.  It reveals  inferior basilar akinesis.  The ejection fraction is approximately 45%.  There is a trace amount of mitral regurgitation.   COMPLICATIONS:  None.   CONCLUSIONS:  Minor coronary artery irregularities.  She has a known  previous inferior wall myocardial infarction, but her right coronary artery  appears to be patent.  She has persistent inferior basilar akinesis.  I do  not  think that this is the cause of her chest pain.  We will watch her overnight  and then plan on discharging her tomorrow.   INDICATIONS:  On the illness           ______________________________  Thayer Headings, M.D.     PJN/MEDQ  D:  01/16/2006  T:  01/17/2006  Job:  [id number]   cc:   Claris Gower, M.D.  Fax: [phone]   Kathrin Penner, M.D.  [address]  [place] [address]

## 2011-01-05 NOTE — H&P (Signed)
[name], [name]                  ACCOUNT NO.:  [id number]   MEDICAL RECORD NO.:  [id number]          PATIENT TYPE:  INP   LOCATION:  4729                         FACILITY:  [facility]   PHYSICIAN:  Thayer Headings, M.D. DATE OF BIRTH:  1934/06/30   DATE OF ADMISSION:  01/16/2006  DATE OF DISCHARGE:                                HISTORY & PHYSICAL   [name] is a middle-aged white female with a history of coronary artery  disease, diabetes mellitus, obesity, and hypercholesterolemia.  She is  admitted now with episodes of chest pain.   [name] has a history of coronary artery disease.  She had a previous inferior  wall myocardial infarction in the past.  She was treated with PTCA of her  right coronary artery.  She has overall been doing fairly well.  She is not  as careful on her diet.  She eats out most of the time.   She recently started having some episodes of chest discomfort.  She has been  on double dose Nexium which seems to control her gastroesophageal reflux  fairly well.  She was seen in our office and was found to have ST-segment  elevation in inferior leads.  Although she has presented like this in the  past and has not had an acute coronary syndrome, she is now admitted for  further evaluation.   CURRENT MEDICATIONS:  1.  Imdur 60 mg a day.  2.  Metoprolol 25 mg p.o. b.i.d.  3.  Furosemide 40 mg a day.  4.  Aspirin 325 mg - 1/2 tablet a day.  5.  Potassium chloride 10 mEq a day.  6.  Lipitor 10 mg a day.  7.  Nexium 40 mg p.o. b.i.d.  8.  Synthroid 0.125 mg.  9.  Lexapro 10 mg a day.  10. Lantus insulin 40 units in the morning with 80 units in the evening.  11. NovoLog 15 units three times a day.  12. Albuterol metered-dose inhaler twice a day.  13. Oxytrol patch 3.9 mg per day, once a day.   She is intolerant to LISINOPRIL which causes a cough.   She is allergic to:  1.  PENICILLIN.  2.  SULFA.   PAST MEDICAL HISTORY:  1.  Coronary artery disease.      1.   She is status post PTCA and stenting of her right coronary artery          and her first diagonal artery.      2.  She has a known occlusion of the ramus intermediate branch.  2.  Hypercholesterolemia.  3.  Diabetes mellitus.  4.  Obesity.   SOCIAL HISTORY:  The patient is a nonsmoker.   FAMILY HISTORY:  Noncontributory.   REVIEW OF SYSTEMS:  As reviewed in the HPI.  She denies any problems with  her eyes, ears, nose, and throat.  She denies any heat or cold intolerance.  She denies any syncope, or presyncope, PND, or orthopnea.   PHYSICAL EXAMINATION:  GENERAL:  She is a middle-aged female in no acute  distress.  She is alert and oriented x3.  Her mood and affect are normal.  VITAL SIGNS:  Her weight is 212 which is up 6 pounds.  Blood pressure is  140/60 with a heart rate of 68.  HEENT:  Reveals 2+ carotids.  She has no bruits, no JVD, no thyromegaly.  LUNGS:  Clear to auscultation.  HEART:  Regular rate.  S1-S2 with no murmurs.  ABDOMEN:  Reveals good bowel sounds and is nontender.  EXTREMITIES:  She has no clubbing, cyanosis, or edema.  NEUROLOGIC:  Nonfocal.   Her EKG reveals a normal sinus rhythm.  She has ST-segment elevation in the  inferior leads as well as some in the lateral leads.   Ms. Hogan presents with episodes of chest pain and EKG abnormalities  consistent with an inferior wall myocardial infarction.   I would like to admit her to the hospital.  We will try arrange for heart  catheterization tonight.  I have discussed the risks, benefits and options  of heart catheterization.  She understands and agrees to proceed.  All of  her other medical problems remain fairly stable.           ______________________________  Thayer Headings, M.D.     PJN/MEDQ  D:  01/16/2006  T:  01/16/2006  Job:  [id number]   cc:   Jacelyn Pi, M.D.  Fax: [phone]   Arelia Sneddon, Dr.

## 2011-01-05 NOTE — H&P (Signed)
NAME:  [name], [name].                           ACCOUNT NO.:  [id number]   MEDICAL RECORD NO.:  [id number]                   PATIENT TYPE:  OIB   LOCATION:                                       FACILITY:  [facility]   PHYSICIAN:  Thayer Headings, M.D.              DATE OF BIRTH:  1934-06-11   DATE OF ADMISSION:  12/22/2002  DATE OF DISCHARGE:                                HISTORY & PHYSICAL   HISTORY OF PRESENT ILLNESS:  [name] is [name] with Hogan  history of coronary artery disease. She is status post PTCA and stenting of  her right coronary artery and her first diagonal artery.  She had Hogan stress  Cardiolite study performed back in August 2003 which was somewhat abnormal.  She had evidence of anterolateral and inferolateral ischemia. She did not  want to have [name] done at that time. She returned about Hogan  month ago with some episodes of chest pain; but, once again did not want to  have [name]. She returned today with worsening chest pains.   The pains are described as [name] with some associated GI  symptoms. She states that the pain was relieved with sublingual  nitroglycerin.  The pain occurred with minimal exercise. She presents today  for further evaluation of her chest pains.   CURRENT MEDICATIONS:  1. Potassium chloride 750 mg Hogan day.  2. Glucophage 1 gm p.o. daily.  3. Prinivil 5 mg Hogan day.  4. Lasix 40 mg Hogan daily.  5. Imdur 60 mg Hogan day.  6. Lipitor 10 mg Hogan day.  7. Synthroid 0.125 mg Hogan daily.  8. Aspirin once Hogan day.  9. Lopressor 25 mg p.o. b.i.d.  10.      Lantus insulin 80 units in the morning.  11.      NovoLog insulin 15 units with meals.  12.      Lexapro 10 mg Hogan day.  13.      Nexium 40 mg Hogan day.   ALLERGIES:  She is allergic to SULFA and PENICILLIN.   PAST MEDICAL HISTORY:  1. Coronary artery disease.  2. Diabetes mellitus.  3. Hypercholesterolemia.   SOCIAL HISTORY:  The patient does not smoke.  She does not drink alcohol.   FAMILY HISTORY:  Her family history is noncontributory.  Her review of  systems is negative except as noted above in the HPI.   PHYSICAL EXAMINATION:  GENERAL:  [name] in no acute  distress.  MENTAL STATUS:  She is alert and oriented x3 and her mood and affect are  normal.  VITAL SIGNS:  Her weight is 195.  Her blood pressure is 130/60 with Hogan heart  rate of 80.  HEENT:  Her HEENT exam reveals 2+ carotids. She has no bruits.  NECK:  There is no JVD.  No thyromegaly.  LUNGS:  Clear to auscultation.  HEART:  Regular rate, S1, S2.  She has no murmurs, gallops, or rubs.  ABDOMEN:  Her abdominal exam reveals good bowel sounds and is nontender.  EXTREMITIES:  She has no clubbing, cyanosis, or edema.  NEUROLOGIC:  Exam is nonfocal.   LABORATORY DATA:  Her EKG reveals normal sinus rhythm with occasional  premature ventricular contractions.  She has an inferolateral myocardial  infarction.   Ms. Guastella presents with worsening chest pains.  She has had an abnormal  Cardiolite study in the recent past.  I have recommended that we proceed  with heart catheterization.  We have discussed the risks, benefits, and  options of  [name].  She understands them and agrees to  proceed.  We will proceed with heart catheterization tomorrow.                                               Thayer Headings, M.D.    PJN/MEDQ  D:  12/21/2002  T:  12/21/2002  Job:  [id number]   cc:   Juanda Bond. Altheimer, M.D.  [address], [address]  [place] [address]  Fax: [phone]

## 2011-01-05 NOTE — Discharge Summary (Signed)
[name], [name]                  ACCOUNT NO.:  [id number]   MEDICAL RECORD NO.:  [id number]          PATIENT TYPE:  INP   LOCATION:  6433                         FACILITY:  [facility]   PHYSICIAN:  Evette Doffing, M.D.  DATE OF BIRTH:  1934-02-10   DATE OF ADMISSION:  03/04/2008  DATE OF DISCHARGE:  03/06/2008                               DISCHARGE SUMMARY   DISCHARGE DIAGNOSES:  1. Shortness of breath, presumed secondary to congestive heart      failure.  2. Questionable history of chronic obstructive pulmonary disease,      although the patient does not recall ever having pulmonary function      tests.  3. History of coronary artery disease with a stress Cardiolite showing      ejection fraction 65% done on November 21, 2007, at [facility].  4. Obesity.  5. Diabetes type 2.  6. Hypothyroidism.  7. Hyperlipidemia.  8. Lumbar back pain.  9. Fatigue.  10.Hypertension.  11.Depression and anxiety.   DISCHARGE MEDICATIONS:  1. Lantus 40 units subcutaneously each morning, 80 units      subcutaneously each evening.  2. NovoLog FlexPen 20 units subcutaneously before meals.  3. Vicodin 5/500 one tab by mouth up to every 6 hours as needed for      pain.  4. Detrol LA 4 mg by mouth daily.  5. Levoxyl 125 mcg p.o. daily.  6. Allegra 180 mg p.o. daily.  7. Alprazolam 0.5 mg p.o. q.12 h. p.r.n.  8. Nitroglycerin 0.4 mg sublingual every 5 minutes up to 3 times if      needed for chest pain.  9. Aspirin 81 mg p.o. daily.  10.Lexapro 10 mg p.o. daily.  11.Phenergan 25 mg p.o. up to 4 times per day as needed.  12.Isosorbide mononitrate 30 mg p.o. daily.  13.Lasix 40 mg p.o. b.i.d.  14.Potassium chloride 10 mEq p.o. daily.  15.Nexium 40 mg 2 tabs p.o. daily.  16.Advair 250/50 one puff b.i.d.  17.Lipitor 40 mg p.o. daily.   DISCHARGE CONDITION AND FOLLOWUP:  At the time of discharge, the  patient's shortness of breath had improved considerably.  She had been  diuresed successfully and appeared to respond well to this.  She was  continued on albuterol and ipratropium.  She is to follow up with Cyndi Bender, who is a PA for Dr. Lisbeth Ply.  At this time, if the patient has  not already received pulmonary function testing, it might be appropriate  to do this in order to establish the diagnosis of COPD or asthma with  more certainty.  In addition, she received a CT angiography of the  chest, which showed possible early pulmonary fibrosis.  It is therefore  recommended that she follow up with a repeat CT of the chest in  approximately 6 months.  The patient's followup appointment is with  Cyndi Bender, March 19, 2008, at 09:45 a.m.   PROCEDURES:  On March 05, 2008, the patient received CT angiography of  the chest to rule out pulmonary embolism.  This  study found no evidence  of pulmonary embolism.  Borderline cardiomegaly with small bilateral  pleural effusions.  Question mild congestive failure.  Mild thoracic  adenopathy.  Nonspecific.  Consider follow up with chest CT  approximately 2 to 3 months.  On discussing the study with the  radiologist with regards specifically to the question of pulmonary  fibrosis, it was felt that there was minimal early subpleural  interstitial fibrosis.   CONSULTATIONS:  None.   BRIEF ADMITTING HISTORY AND PHYSICAL:  The patient presented with  increased shortness of the breath over the 2 months prior to admission,  which appears mostly on exertion and is relieved by rest.  Approximately, every 2 weeks, she has had an exacerbation with shortness  of breath and wheezing and weakness for which she uses a nebulizer  machine at home.  She has felt increasing shortness of breath at the  beginning of the week prior to admission associated with weakness after  visiting her husband who lives in a skilled nursing facility.  She awoke  the morning prior to admission with shortness of breath and took a  nebulizer and was  able to fall asleep until she awoke again with  shortness of breath and dizziness and decided to call EMS.   PHYSICAL EXAMINATION:  VITALS :  Her temperature was 97.3, blood  pressure 131/59, pulse 71, respirations 20, oxygen saturation 93% on  room air and 96% on 2 liters.  GENERAL:  The patient was in no acute distress.  She is obese.  She had  poor air movement.  No rhonchi.  No wheezes.  HEART:  Regular rate and rhythm.  No murmurs, rubs, or gallops.  ABDOMEN:  Obese, soft, nontender, and nondistended.  EXTREMITIES:  Mild pitting edema.  SKIN:  Moist and pale.  The remainder of the exam was unremarkable.   LABORATORY DATA:  Sodium 141, potassium 3.6, chloride 106, bicarbonate  26, BUN 10, creatinine 0.9, and glucose 100.  Her alkaline phosphatase  was 176.  The remainder of her liver panel was within normal limits.  Her white count was 9.2, hemoglobin 13.6, and platelets 145.  Her BNP  was 165.  Point of care cardiac markers were negative x1.  Portable  chest x-ray showed pulmonary vascular congestion without overt edema and  low lung volumes with basilar atelectasis.  The D-dimer was 0.57.   HOSPITAL COURSE:  Dyspnea.  The patient's presentation was most  consistent with a congestive heart failure exacerbation and pulmonary  edema.  She was diuresed and her symptoms improved.  However, her  history was consistent with a possibility of chronic pulmonary embolism  and given her elevated D-dimer and an acute increase in her dyspnea  after using the rest room, it was decided to get a CT angiography of the  chest to rule out pulmonary embolism, and this study's findings are  described above.  The patient continued to improve during her  hospitalization.  Although, her dyspnea had not completely resolved.  She was feeling well enough to go home.  It was decided to divide her  Lasix dose into 2 doses and to decrease her dose of Imdur from 60 mg to  30 mg to prevent hypertension.  We  would strongly recommend outpatient  pulmonary function testing.   DISCHARGE LABORATORIES AND VITALS:  Temperature 97.3, pulse 71,  respirations 20, blood pressure 140/63, and oxygen saturation 94% on 2  liters.  Last CBC had a white blood count of 12.1,  hemoglobin 12.8,  platelets 168, sodium 133, potassium 4.0, chloride 96, bicarbonate 30,  glucose 253, BUN 18, creatinine 1.1, and  calcium 8.7.  C1 esterase inhibitor was 13 with a reference range of 11-  26.  Complement C4 was 41 with a reference range of 16-47.   PENDING LABORATORY DATA:  There are no laboratory results pending at the  time of this dictation.      Lajean Saver, MD  Electronically Signed      Evette Doffing, M.D.  Electronically Signed    PN/MEDQ  D:  03/10/2008  T:  03/11/2008  Job:  [id number]   cc:   Daiva Eves, MD  Cyndi Bender

## 2011-01-05 NOTE — H&P (Signed)
[place]. [facility]  Patient:    [name], [name]                         MRN: [id number] Adm. Date:  [id number] Attending:  Orlie Hogan CC:         [name], M.D.   History and Physical  HISTORY OF PRESENT ILLNESS:  [name] is a 77 year old female with a history of coronary artery disease, hypothyroidism, diabetes mellitus, hypercholesterolemia, and hypertension.  She is admitted for acute onset of shortness of breath.  The patient was seen yesterday in the emergency room with an episode of chest pain.  She was found to have mild ST changes and was taken to the catheterization laboratory.  She was found to have unremarkable coronary arteries.  She has only minor luminal irregularities.  Her PTCA and stent to the right coronary artery is still widely patent.  She had well-preserved left ventricular systolic function with an ejection fraction of approximately 50%. She was discharged to home around 8 p.m. last night.  She was feeling well and was not having any episodes of chest pain or shortness of breath.  The patient slept well and did not have any problems overnight.  She had relatively balanced intake and output during her recovery from the heart catheterization yesterday.  This morning, around 7 or 8 a.m., she experienced severe dyspnea.  She was having wheezing, according to her husband.  She was seen by [name] and was found to be in severe respiratory distress.  She was transferred to [name] via EMS.  Here she was found to have an O2 saturation in the 70s. She was found to be somewhat cyanotic.  She was given IV Lasix and was placed on a BiPAP mask.  She recovered fairly rapidly.  She had not had any episodes of chest pain.  She denies any cough or cold symptoms.  She denies any pain or swelling in her legs.  CURRENT MEDICATIONS: 1. Synthroid 0.125 mg a day. 2. Enteric-coated aspirin 81 mg a day. 3. Lopressor 25 mg p.o.  b.i.d. 4. Insulin NPH 45 units subcu twice a day. 5. Lipitor 10 mg a day. 6. Lisinopril 5 mg a day.  ALLERGIES:  PENICILLIN and SULFA.  PAST MEDICAL HISTORY: 1. Coronary artery disease - status post inferior wall myocardial infarction.    She also has a known occlusion of a very small ramus intermediate branch. 2. Diabetes mellitus - followed by [name]. 3. Hypercholesterolemia.  SOCIAL HISTORY:  The patient is a nonsmoker and does not drink alcohol.  FAMILY HISTORY:  Noncontributory.  REVIEW OF SYSTEMS:  Unremarkable.  She has had some shortness of breath and some dyspnea on exertion since last Friday.  PHYSICAL EXAMINATION:  GENERAL:  She is a middle-aged female who is currently in no distress.  VITAL SIGNS:  Early this morning, her blood pressure was 170/90.  Her blood pressure is currently 100/70.  Her heart rate is in the 70s.  HEENT:  Reveals no significant JVD.  She was noted to have JVD upon arrival to the ER this morning.  LUNGS:  She has a few basilar rales.  HEART:  Regular rate, S1, S2.  There is no S4 gallop.  ABDOMEN:  Mildly obese.  She has no hepatosplenomegaly.  There are no masses or bruits and her abdomen is nontender.  EXTREMITIES:  She has no calf tenderness.  Her pulses are 2+.  Her groin is within normal limits.  She has no clubbing, cyanosis, or edema.  NEUROLOGIC:  Reveals cranial nerves 2-12 are intact and her motor and sensory function are intact.  Her gait was not assessed.  LABORATORY DATA:  Her EKG reveals normal sinus rhythm.  She has small Q waves in the inferior leads.  She has some nonspecific ST changes in the lateral leads but it is essentially unchanged from her EKG yesterday.  Laboratory is currently pending.  Her chest x-ray upon arrival reveals mild cardiomegaly.  She has bilateral pulmonary edema.  IMPRESSION:  [name] presents with acute onset of shortness of breath.  Her heart catheterization yesterday revealed  only minimal coronary artery irregularities and she had a well-preserved left ventricular systolic function.  I cannot explain this episode of flash pulmonary edema at this time.  She denies eating any extra salty items.  We will admit her for observation.  We will check cardiac enzymes.  We will repeat an echocardiogram for further evaluation of her LV function.  We will collect Accu-Cheks for monitoring of her diabetes.  [name] will assist with management of her diabetes.  Her hypercholesterolemia has been fairly well controlled.  We will continue the Lipitor and we will follow her lipids. DD:  08/06/00 TD:  08/06/00 Job: [id number]

## 2011-01-05 NOTE — Cardiovascular Report (Signed)
NAME:  [name], [name]                            ACCOUNT NO.:  [id number]   MEDICAL RECORD NO.:  [id number]                   PATIENT TYPE:  OIB   LOCATION:  2860                                 FACILITY:  [facility]   PHYSICIAN:  Thayer Headings, M.D.              DATE OF BIRTH:  05-Mar-1934   DATE OF PROCEDURE:  12/22/2002  DATE OF DISCHARGE:  12/22/2002                              CARDIAC CATHETERIZATION   INDICATIONS FOR PROCEDURE:  The patient is [name] with Hogan  history of coronary artery disease with Hogan previous inferior wall myocardial  infarction.  She returned to the office yesterday with episodes of chest  pain relieved with sublingual nitroglycerin.  She is referred for Hogan heart  catheterization for further evaluation.   PROCEDURE:  Left heart catheterization with coronary angiography.   PROCEDURAL NOTE:  The right femoral artery was easily cannulated using the  modified Seldinger technique.   HEMODYNAMICS:  The left ventricular pressure was 108/13 with an aortic  pressure of 107/58.   ANGIOGRAPHY:   CORONARY ANGIOGRAPHY:  1. The left main was engaged using Hogan Judkins 3.5 guide.  The left main is     fairly smooth and normal.  2. The left anterior descending artery has mild irregularities in the     proximal segment between 20% and 25%.  The LAD gives off Hogan diagonal     vessel and then has mild to moderate irregularities between 20% and 30%.     The distal LAD has mild irregularities between 10% and 20%.  The first     diagonal vessel is Hogan moderate-sized vessel.  The takeoff from the LAD is     normal.  There are minor luminal irregularities.  3. The ramus intermedius vessel is occluded proximally.  It fills very     slowly via right-to-left collaterals.  This vessel has been known to be     occluded for the past several years.  4. The left circumflex artery is Hogan moderate-sized vessel.  It has only     minimal irregularities and basically is fairly normal.   The first obtuse     marginal is fairly tortuous, but is otherwise unremarkable.  5. The right coronary artery is moderate to large in size.  The proximal     stent is widely patent.  There are minor luminal irregularities in the     mid/distal segment between 20% and 25%.  The posterior descending and the     posterolateral segment artery are unremarkable.   LEFT VENTRICULOGRAPHY:  Hogan left ventriculogram was performed in the 30-degree  RAO position.  It reveals akinesis of the inferior base.  The remaining wall  seemed to contract fairly well.  The ejection fraction was approximately 45%  to 50%.  There is no mitral regurgitation.   COMPLICATIONS:  None.    CONCLUSIONS:  Mild to moderate  coronary artery irregularities.  There is  evidence of Hogan previous inferobasal myocardial infarction which was known.  The right coronary artery stent is widely patent.  We will continue with  medical therapy.                                               Thayer Headings, M.D.    PJN/MEDQ  D:  12/22/2002  T:  12/23/2002  Job:  [id number]   cc:   Juanda Bond. Altheimer, M.D.  [address], [address] [place]  [place] [address]  Fax: [phone]

## 2011-01-05 NOTE — H&P (Signed)
NAMESUMI, LYE                  ACCOUNT NO.:  [id number]   MEDICAL RECORD NO.:  [id number]          PATIENT TYPE:  INP   LOCATION:  4707                         FACILITY:  [facility]   PHYSICIAN:  Thayer Headings, M.D. DATE OF BIRTH:  01-Jan-1934   DATE OF ADMISSION:  05/22/2005  DATE OF DISCHARGE:                                HISTORY & PHYSICAL   HISTORY OF PRESENT ILLNESS:  [name] is a middle-aged female with a  history of coronary artery disease.  She is status post PTCA and stenting of  her right coronary artery in the past, and also is status post PTCA and  stenting of her first diagonal artery.  She has a history of  hypercholesterolemia and diabetes mellitus.  She presented to the office  today with 3 days of intermittent chest pain relieved with nitroglycerin.  We admitted her to the hospital from the office and scheduled her for an  urgent heart catheterization.   [name] has had episodes of coronary artery disease since 1996 when she  had her first angioplasty.  She has done fairly well since her last  catheterization, which was in 2004.  At that time, she was found to have  only minor luminal irregularities.  She has done fairly well and has not had  too much in the way of trouble.  She has been under a lot of stress recently  with the hospitalization of her husband.  She states that for the past 3  days she has had lots of indigestion and chest discomfort.  This is relieved  with her nitroglycerin.  She presented to the office today and was found to  have ST segment elevation in the inferior leads.  We arranged for admission  to the hospital based on these results.   She states that she has had lots of fatigue recently.  She also had  shortness of breath.  She denies any PND or orthopnea.   CURRENT MEDICATIONS:  1.  Imdur 60 mg a day.  2.  Metoprolol 25 mg p.o. b.i.d.  3.  Furosemide 40 mg a day.  4.  Aspirin 325 mg daily.  5.  Potassium chloride 10 mEq a  day.  6.  Lipitor 10 mg a day.  7.  Nexium 40 mg a day.  8.  Synthroid 0.125 mg a day.  9.  Lexapro 10 mg a day.  10. Lantus insulin 80 units in the morning.  11. NovoLog 15 units three times a day.  12. Albuterol metered dose inhaler as needed.  13. Vesicare 5 mg a day.   ALLERGIES:  She is allergic to -  1.  PENICILLIN.  2.  SULFA.  She is intolerant to -  1.  LISINOPRIL.   PAST MEDICAL HISTORY:  1.  Coronary artery disease.  2.  Hypercholesterolemia.  3.  Diabetes mellitus.   SOCIAL HISTORY:  The patient is a nonsmoker and a nondrinker.  Her history  is noncontributory.   REVIEW OF SYSTEMS:  Her review of systems was reviewed and is essentially  negative.  PHYSICAL EXAMINATION:  GENERAL:  She is a middle-aged female in no acute  distress.  She is alert and oriented x3, and her mood and affect are normal.  VITAL SIGNS:  Her weight is 208.  Her blood pressure is 110/60 with a heart  rate of 68.  HEAD/NECK:  2+ carotids.  She has no bruits, no JVD, no thyromegaly.  LUNGS:  Clear to auscultation.  HEART:  Regular rate.  S1 and S2.  She has no murmurs.  ABDOMEN:  Good bowel sounds and is nontender.  EXTREMITIES:  She has no clubbing, cyanosis, or edema.  NEUROLOGIC:  Nonfocal.   EKG:  1.  EKG #1 revealed normal sinus rhythm.  She has ST segment elevation in      the inferior leads (0.5 to 1 mm).  She has old inferior Q waves.  2.  EKG #2 obtained approximately 20-30 minutes later after nitroglycerin      revealed persistent Q waves, but significantly improved ST segment      elevation.   [name] presents with intermittent episodes of chest pains relieved with  nitroglycerin.  She has ST segment elevation, consistent with an acute  inferior wall myocardial infarction.  We will admit her to the hospital and  arrange for her to have a heart catheterization.  We have discussed with  risks, benefits, and options of heart catheterization.  She understands and  agrees to  proceed.  All of her other medical problems are stable.           ______________________________  Thayer Headings, M.D.     PJN/MEDQ  D:  05/22/2005  T:  05/22/2005  Job:  [id number]   cc:   Juanda Bond. Altheimer, M.D.  Fax: [phone]

## 2011-01-05 NOTE — Cardiovascular Report (Signed)
NAMEBRIDGET, [place]                  ACCOUNT NO.:  [id number]   MEDICAL RECORD NO.:  [id number]          PATIENT TYPE:  INP   LOCATION:  4707                         FACILITY:  [facility]   PHYSICIAN:  Thayer Headings, M.D. DATE OF BIRTH:  30-Dec-1933   DATE OF PROCEDURE:  05/22/2005  DATE OF DISCHARGE:                              CARDIAC CATHETERIZATION   [name] is a 77 year old female with a history of diabetes mellitus,  coronary artery disease, and mild congestive heart.  She presented to the  office today with the three-day history of in the intermittent chest pain.  The chest pain was transiently relieved with sublingual nitroglycerin.  She  was found have ST-segment elevation in the inferior leads by EKG.  She was  referred for heart catheterization.   PROCEDURE:  Left heart catheterization with coronary angiography.  The right  femoral artery was easily cannulated using a modified Seldinger technique.   HEMODYNAMIC RESULTS:  LV pressure was 109/18 with a an aortic pressure of  108/54.   ANGIOGRAPHY:  1.  Left main.  The left main is fairly smooth and normal.  2.  The left anterior descending artery has mild to moderate irregularities      in the proximal segment.  The stenosis is between 20-30%.  The mid      vessel is fairly unremarkable.  The distal vessel has minor luminal      irregularities.  3.  The first diagonal vessel is a very large vessel.  It is normal.  The      subsequent diagonal vessels are fairly small.  4.  The ramus intermediate branch is occluded proximally.  This was known      from previous heart catheterizations.  5.  The left circumflex artery is a moderate size vessel.  It gives off a      large obtuse marginal branch which has minor luminal irregularities.      The circumflex continues around and supplies a small to moderate size      posterolateral branch. There are minor luminal irregularities in the      circumflex artery.  6.  The right  coronary artery is large and dominant.  The proximal stent is      widely patent.  There are mild luminal irregularities in the proximal      and mid/distal segments.  There is approximately 20% stenosis at the end      of the stent.  7.  The posterior descending artery has mild irregularities.  The posterior      descending artery is normal.   LEFT VENTRICULOGRAM:  Was performed in the 30 RAO position.  It reveals  moderate left ventricular dysfunction.  The ejection fraction is between 35-  40%.  There is inferior basilar and mid inferior akinesis with mild  hypokinesis of the inferior apical wall. The remainder of the heart  contracts fairly normally.  There is mild mitral regurgitation.   COMPLICATIONS:  None.   CONCLUSIONS:  1.  Moderate coronary artery disease.  She has no significant lesions that  would explain her acute ST-segment elevation. She has a known history of      a previous inferior wall myocardial infarction, and she has inferior      wall akinesis.  2.  The right coronary artery stent is widely patent.  3.  Her left ventricular systolic function appears to have fallen some since      her previous heart catheterization.   1.  We will continue with medical care.  2.  We will make sure that she does not eat a very high salt diet.  3.  We will make sure her blood pressure is under control, and that her      diuretics are adequate.           ______________________________  Thayer Headings, M.D.     PJN/MEDQ  D:  05/22/2005  T:  05/22/2005  Job:  [id number]   cc:   Juanda Bond. Altheimer, M.D.  Fax: [phone]

## 2011-01-05 NOTE — Discharge Summary (Signed)
[place]  Patient:    [name], [name]                         MRN: [id number] Adm. Date:  [id number] Disc. Date: [id number] Attending:  Randa Spike CC:         Juanda Bond. Altheimer, M.D.   Discharge Summary  DISCHARGE DIAGNOSES: 1. Flash pulmonary edema. 2. History of coronary artery disease. 3. Diabetes mellitus. 4. Hypercholesterolemia. 5. Hypothyroidism.  DISCHARGE MEDICATIONS: 1. Synthroid 0.125 mg a day. 2. Enteric-coated aspirin 325 mg a day. 3. Atenolol 25 mg p.o. b.i.d. 4. Insulin NPH 45 units twice a day. 5. Lipitor 10 mg q.h.s. 6. Lisinopril 5 mg a day. 7. Lasix 40 mg a day. 8. Potassium chloride 10 mEq a day. 9. Albuterol metered dose inhaler two puffs three times a day as needed.  DIET:  The patient is instructed to eat a low-salt, low-fat diet.  FOLLOW-UP:  She is instructed to see Ramond Dial., M.D., in one week.  HISTORY OF PRESENT ILLNESS:  Ms. Lippert is a 77 year old female who was admitted with sudden onset of congestive heart failure.  She had been seen the day before and had had a heart catheterization which revealed only coronary irregularities.  Please see the dictated history and physical for further details.  HOSPITAL COURSE:  #1 - FLASH PULMONARY EDEMA:  The patient had cardiac enzymes drawn and ruled out for myocardial infarction.  She was diuresed in the emergency room and felt better almost immediately.  She initially required high levels of oxygen and BIPAP, but was quickly tapered to nasal cannula oxygen.  On the day of discharge, she was up ambulating without any significant problems.  She is no longer having any wheezing and does not have any rales.  An echocardiogram was performed late yesterday and the results are still pending.  The patient will follow up with Ramond Dial., M.D., in the office in one week.  #2 - CORONARY ARTERY DISEASE:  The patient has a history of CAD.   She had a heart catheterization at the first of this week, which revealed only mild luminal irregularities.  With this episode of congestive heart failure, she did not have any rise of her cardiac enzymes.  This rules out the possibility of coronary embolus.  #3 - HYPERCHOLESTEROLEMIA:  The patient has been instructed to eat a low-fat, low-salt, and low-cholesterol diet.  We will follow up her cholesterol levels in the office. DD:  08/08/00 TD:  08/09/00 Job: [id number]

## 2011-01-05 NOTE — Discharge Summary (Signed)
NAMEKORIANA, STEPIEN                  ACCOUNT NO.:  [id number]   MEDICAL RECORD NO.:  [id number]          PATIENT TYPE:  INP   LOCATION:  4729                         FACILITY:  [facility]   PHYSICIAN:  Thayer Headings, M.D. DATE OF BIRTH:  August 26, 1933   DATE OF ADMISSION:  01/16/2006  DATE OF DISCHARGE:  01/17/2006                                 DISCHARGE SUMMARY   DISCHARGE DIAGNOSIS:  1.  Non-cardiac chest pain.  2.  History of coronary artery disease.  3.  Congestive heart failure.  4.  Hyperlipidemia.  5.  Hypothyroidism.  6.  Diabetes mellitus.  7.  Asthma.  8.  Obesity.   DISCHARGE MEDICATIONS:  1.  Metoprolol 25 mg p.o. b.i.d.  2.  Lasix 40 mg a day.  3.  Aspirin 81 mg a day.  4.  Potassium chloride 10 mEq a day.  5.  Lipitor 40 mg a day.  6.  Nexium 40 mg twice a day.  7.  Synthroid 0.125 mg.  8.  Lexapro 10 mg a day.  9.  NovoLog 15 units three times a day or as otherwise instructed by Dr.      Chalmers Cater.  10. Lantus insulin 40 units in the morning with 80 units in the evening or      as otherwise instructed by [name].  11. Albuterol metered-dose inhaler as needed.  12. Oxytrol patch once a day.   DISPOSITION:  Patient is to see [name] in one to two weeks.  She is to  see Dr. Acie Fredrickson in two to three weeks.  She is to watch her diet.  She was  instructed not to eat out on a regular basis.  She was instructed to  exercise every day.  We will increase Lipitor to 40 mg a day.   HISTORY:  Ms. Morrisette is a 77 year old female with a history of coronary  artery disease.  She presented to the office yesterday with episodes of  chest pain and mild ST-segment elevation in the inferior leads.  She was  admitted to hospital for heart catheterization.  Please see dictated H&P for  further details.   HOSPITAL COURSE:  #1 - CHEST PAIN:  The patient ruled out for myocardial  infarction.  She had a heart catheterization which revealed mild to moderate  irregularities.  She has an  occlusion of her ramus intermediate branch.  This ramus intermediate has been occluded for many years.  She has good  collateral filling.  It is possible that this was causing some ischemia.   She did not have any epicardial blockages but certainly with her diabetes  she could have some degree of small vessel disease.   She will be discharged on the above-noted medications.   #2 - HYPERLIPIDEMIA:  The patient had lipid profile drawn which revealed an  elevated LDL.  We will increase her Lipitor 40 mg a day.  Will check her  lipids and hepatic profile in three months.  All of her other medical  problems are stable.  ______________________________  Thayer Headings, M.D.     PJN/MEDQ  D:  01/17/2006  T:  01/17/2006  Job:  [id number]   cc:   Claris Gower, M.D.  Fax: [phone]   Jacelyn Pi, M.D.  Fax: [phone]

## 2011-01-05 NOTE — Discharge Summary (Signed)
[name], [name]                  ACCOUNT NO.:  [id number]   MEDICAL RECORD NO.:  [id number]          PATIENT TYPE:  INP   LOCATION:  4707                         FACILITY:  [facility]   PHYSICIAN:  Thayer Headings, M.D. DATE OF BIRTH:  12-01-1933   DATE OF ADMISSION:  05/22/2005  DATE OF DISCHARGE:  05/23/2005                                 DISCHARGE SUMMARY   DISCHARGE DIAGNOSIS:  1.  History of coronary artery disease.  2.  Noncardiac chest pain.  3.  Hyperlipidemia.  4.  Hypothyroidism.  5.  Diabetes mellitus.   DISCHARGE MEDICATIONS:  1.  Imdur 60 mg a day.  2.  Metoprolol 25 mg p.o. b.i.d.  3.  Furosemide 40 mg a day.  4.  Aspirin 325 mg a day.  5.  Potassium chloride 10 mEq a day.  6.  Lipitor 10 mg a day.  7.  Synthroid 0.125 mg a day.  8.  Lantus insulin 8 units in the morning.  9.  NovoLog 15 units 3x a day.  10. Albuterol metered-dose inhaler as needed.   DISPOSITION:  The patient will see [name] in 1-2 weeks. She will see Dr.  Acie Fredrickson in 1-2 weeks.  She is to eat a low-fat, low-cholesterol diet.   HISTORY:  [name] is a middle-aged female with a history of coronary  artery disease. She presented to the office with episodes of chest pain.  Please see dictated H&P for further details.   HOSPITAL COURSE BY PROBLEMS:  Problem #1:  CHEST PAIN. The patient was  admitted and had heart catheterization. She was found to have moderate  diffuse coronary artery disease. The right coronary artery had stent was  widely patent. She had mild-to-moderate disease involving the left anterior  descending artery. She had a ramus intermediate branch that was chronically  occluded. This vessel has been occluded for the past 10 years; and is not  likely the source of her chest pain.   The left ventricular systolic function was moderately reduced with an  ejection fraction of around 35-40%. She had 1+ mitral regurgitation.   The patient did fairly well. She did not have any  further episodes of chest  pain. She does have moderately reduced left ventricular systolic function.  We will continue with aggressive medical therapy. We will have her loose  weight and exercise on a daily basis.           ______________________________  Thayer Headings, M.D.     PJN/MEDQ  D:  07/13/2005  T:  07/13/2005  Job:  [id number]   cc:   [name] [name]

## 2011-03-13 ENCOUNTER — Other Ambulatory Visit: Payer: Self-pay | Admitting: Cardiovascular Disease

## 2011-04-03 ENCOUNTER — Encounter: Payer: Self-pay | Admitting: Cardiovascular Disease

## 2011-04-11 ENCOUNTER — Ambulatory Visit (INDEPENDENT_AMBULATORY_CARE_PROVIDER_SITE_OTHER): Payer: Medicare Other | Admitting: Cardiovascular Disease

## 2011-04-11 ENCOUNTER — Encounter: Payer: Self-pay | Admitting: Cardiovascular Disease

## 2011-04-11 DIAGNOSIS — J449 Chronic obstructive pulmonary disease, unspecified: Secondary | ICD-10-CM

## 2011-04-11 DIAGNOSIS — I2510 Atherosclerotic heart disease of native coronary artery without angina pectoris: Secondary | ICD-10-CM

## 2011-04-11 DIAGNOSIS — G4730 Sleep apnea, unspecified: Secondary | ICD-10-CM

## 2011-04-11 DIAGNOSIS — I5032 Chronic diastolic (congestive) heart failure: Secondary | ICD-10-CM

## 2011-04-11 DIAGNOSIS — G4733 Obstructive sleep apnea (adult) (pediatric): Secondary | ICD-10-CM | POA: Insufficient documentation

## 2011-04-11 DIAGNOSIS — I509 Heart failure, unspecified: Secondary | ICD-10-CM

## 2011-04-11 NOTE — Assessment & Plan Note (Signed)
[name] continues to have some shortness of breath. She's been found to have diastolic dysfunction. This is most likely due to her weight and diabetes. We'll encourage her to continue with the diet and exercise program.

## 2011-04-11 NOTE — Patient Instructions (Addendum)
DR RAMASWAMY APP.  Friday SEPT 14  ARRIVE 2:15,  TO CHANGE APP, CALL [phone]/

## 2011-04-11 NOTE — Progress Notes (Signed)
[name] Date of Birth  11-23-1933 [facility] / [facility] [address], [place]  [address] [phone]  Fax  [phone]  History of Present Illness:  77 yo female with hx of CAD, DM, and diastolic dysfunction.  She had an echocardiogram done in [place]  which confirmed severe diastolic dysfunction. Her left ventricular systolic function is normal. He did not have any chest pain angina-like chest pain  She had a sleep study but unfortunately was not able to go to sleep during the study.  Current Outpatient Prescriptions on File Prior to Visit  Medication Sig Dispense Refill  . ALPRAZolam (XANAX) 0.5 MG tablet Take 0.5 mg by mouth as needed.       Marland Kitchen aspirin 325 MG tablet Take 81 mg by mouth daily.       Marland Kitchen atorvastatin (LIPITOR) 20 MG tablet Take 20 mg by mouth daily.        . carvedilol (COREG) 12.5 MG tablet Take 1 tablet (12.5 mg total) by mouth 2 (two) times daily.  60 tablet  11  . Fluticasone-Salmeterol (ADVAIR HFA IN) Inhale into the lungs 2 (two) times daily.        . furosemide (LASIX) 40 MG tablet Take 40 mg by mouth daily.        Marland Kitchen glyBURIDE-metformin (GLUCOVANCE) 5-500 MG per tablet Take 1 tablet by mouth 2 (two) times daily.        . insulin aspart (NOVOLOG) 100 UNIT/ML injection Inject 55 Units into the skin daily.        . insulin glargine (LANTUS) 100 UNIT/ML injection Inject into the skin at bedtime. 45 in the mornings and 70 at night      . levothyroxine (SYNTHROID, LEVOTHROID) 125 MCG tablet Take 125 mcg by mouth daily.        . nitroGLYCERIN (NITROSTAT) 0.4 MG SL tablet TAKE 1 TABLET UNDER THE TONGUE AS NEEDEDFOR CHEST PAIN (MAY REPEAT EVERY 5 MIN X3 DOSES, CALL 911 IF NO RELIEF.)  25 tablet  1  . Olopatadine HCl (PATANASE NA) Place into the nose 2 (two) times daily. 25       . potassium chloride (KLOR-CON) 10 MEQ CR tablet Take 10 mEq by mouth daily.        . Promethazine HCl (PHENERGAN PO) Take by mouth as  needed.        . fexofenadine (ALLEGRA) 180 MG tablet Take 180 mg by mouth daily.        Marland Kitchen omeprazole (PRILOSEC) 40 MG capsule Take 40 mg by mouth daily.          Allergies  Allergen Reactions  . Ace Inhibitors Cough  . Penicillins   . Sulfonamide Derivatives     Past Medical History  Diagnosis Date  . Coronary artery disease     status post inferior wall myocardial infarction  . Diabetes mellitus   . Obesity   . Leg edema   . Sleep apnea   . Hypothyroidism   . Dyslipidemia   . Hypotension 08/02/2010    recent episodes of orthostatic hypotension  . Neuropathy of hand     left hand numbness/neuropathy    Past Surgical History  Procedure Date  . Cardiac catheterization     The ejection fraction is around 50%    History  Smoking status  . Never Smoker   Smokeless tobacco  . Not on file    History  Alcohol Use  No    No family history on file.  Reviw of Systems:  Reviewed in the HPI.  All other systems are negative.  Physical Exam: BP 110/54  Pulse 63  Ht 5' (1.524 m)  Wt 193 lb (87.544 kg)  BMI 37.69 kg/m2 The patient is alert and oriented x 3.  The mood and affect are normal.   Skin: warm and dry.  Color is normal.    HEENT:   the sclera are nonicteric.  The mucous membranes are moist.  The carotids are 2+ without bruits.  There is no thyromegaly.  There is no JVD.    Lungs: clear.  The chest wall is non tender.    Heart: regular rate with a normal S1 and S2.  There are no murmurs, gallops, or rubs. The PMI is not displaced.     Abdomen: good bowel sounds.  There is no guarding or rebound.  There is no hepatosplenomegaly or tenderness.  There are no masses.   Extremities:  no clubbing, cyanosis, or edema.  The legs are without rashes.  The distal pulses are intact.   Neuro:  Cranial nerves II - XII are intact.  Motor and sensory functions are intact.    The gait is normal.  ECG: Normal sinus rhythm. She has right ventricular  hypertrophy.  Assessment / Plan:

## 2011-04-11 NOTE — Assessment & Plan Note (Signed)
She's doing well from a cardiac standpoint. She's not had any episodes of angina. We'll check a fasting lipid profile at her next visit.  We've asked her to try to get out and get a little bit more exercise.

## 2011-04-11 NOTE — Assessment & Plan Note (Signed)
She continues to have problems with her COPD. She would like to see a pulmonologist up in [place]. We will make that referral for her.

## 2011-04-27 ENCOUNTER — Institutional Professional Consult (permissible substitution): Payer: Medicare Other | Admitting: Pulmonary Disease

## 2011-05-04 ENCOUNTER — Institutional Professional Consult (permissible substitution): Payer: Medicare Other | Admitting: Internal Medicine

## 2011-05-14 LAB — CBC
HCT: 42.5
Hemoglobin: 14.3
MCHC: 33.6
MCV: 85.6
Platelets: 183
RBC: 4.97
RDW: 16 — ABNORMAL HIGH
WBC: 8.4

## 2011-05-14 LAB — DIFFERENTIAL
Basophils Absolute: 0.1
Basophils Relative: 1
Eosinophils Absolute: 0.2
Eosinophils Relative: 2
Lymphocytes Relative: 21
Lymphs Abs: 1.7
Monocytes Absolute: 0.9
Monocytes Relative: 11
Neutro Abs: 5.5
Neutrophils Relative %: 66

## 2011-05-14 LAB — POCT CARDIAC MARKERS
CKMB, poc: <1 — ABNORMAL LOW
CKMB, poc: <1 — ABNORMAL LOW
Myoglobin, poc: 32.4
Myoglobin, poc: 34.8
Operator id: 146091
Operator id: 146091
Troponin i, poc: <0.05
Troponin i, poc: <0.05

## 2011-05-14 LAB — CK TOTAL AND CKMB (NOT AT ARMC)
CK, MB: 1.8
Relative Index: INVALID
Total CK: 88

## 2011-05-14 LAB — I-STAT 8, (EC8 V) (CONVERTED LAB)
Acid-Base Excess: 1
BUN: 11
Bicarbonate: 27.9 — ABNORMAL HIGH
Chloride: 104
Glucose, Bld: 160 — ABNORMAL HIGH
HCT: 47 — ABNORMAL HIGH
Hemoglobin: 16 — ABNORMAL HIGH
Operator id: 146091
Potassium: 3.8
Sodium: 137
TCO2: 29
pCO2, Ven: 50.4 — ABNORMAL HIGH
pH, Ven: 7.35 — ABNORMAL HIGH

## 2011-05-14 LAB — TROPONIN I: Troponin I: 0.02

## 2011-05-14 LAB — HEMOGLOBIN A1C
Hgb A1c MFr Bld: 10.7 — ABNORMAL HIGH
Mean Plasma Glucose: 304

## 2011-05-14 LAB — CARDIAC PANEL(CRET KIN+CKTOT+MB+TROPI)
CK, MB: 1.9
Relative Index: INVALID
Total CK: 90
Troponin I: 0.03

## 2011-05-14 LAB — PROTIME-INR
INR: 1
Prothrombin Time: 13

## 2011-05-14 LAB — B-NATRIURETIC PEPTIDE (CONVERTED LAB): Pro B Natriuretic peptide (BNP): 74

## 2011-05-14 LAB — POCT I-STAT CREATININE
Creatinine, Ser: 1
Operator id: 146091

## 2011-05-14 LAB — APTT: aPTT: 40 — ABNORMAL HIGH

## 2011-05-18 LAB — CULTURE, BLOOD (ROUTINE X 2)
Culture: NO GROWTH
Culture: NO GROWTH

## 2011-05-18 LAB — URINALYSIS, ROUTINE W REFLEX MICROSCOPIC
Bilirubin Urine: NEGATIVE
Glucose, UA: NEGATIVE
Hgb urine dipstick: NEGATIVE
Ketones, ur: NEGATIVE
Nitrite: NEGATIVE
Protein, ur: NEGATIVE
Specific Gravity, Urine: 1.006
Urobilinogen, UA: 0.2
pH: 6.5

## 2011-05-18 LAB — BASIC METABOLIC PANEL
BUN: 16
BUN: 18
CO2: 27
CO2: 30
Calcium: 8.6
Calcium: 8.7
Chloride: 96
Chloride: 99
Creatinine, Ser: 1.07
Creatinine, Ser: 1.1
GFR calc Af Amer: 59 — ABNORMAL LOW
GFR calc Af Amer: >60
GFR calc non Af Amer: 49 — ABNORMAL LOW
GFR calc non Af Amer: 51 — ABNORMAL LOW
Glucose, Bld: 253 — ABNORMAL HIGH
Glucose, Bld: 360 — ABNORMAL HIGH
Potassium: 4
Potassium: 4.7
Sodium: 133 — ABNORMAL LOW
Sodium: 134 — ABNORMAL LOW

## 2011-05-18 LAB — CBC
HCT: 37.7
HCT: 40.5
Hemoglobin: 12.8
Hemoglobin: 13.6
MCHC: 33.5
MCHC: 34
MCV: 85.3
MCV: 85.4
Platelets: 145 — ABNORMAL LOW
Platelets: 168
RBC: 4.42
RBC: 4.75
RDW: 15.4
RDW: 16 — ABNORMAL HIGH
WBC: 12.1 — ABNORMAL HIGH
WBC: 9.2

## 2011-05-18 LAB — URINE CULTURE
Colony Count: NO GROWTH
Culture: NO GROWTH

## 2011-05-18 LAB — C1 ESTERASE INHIBITOR: C1INH SerPl-mCnc: 13 mg/dL (ref 11–26)

## 2011-05-18 LAB — COMPREHENSIVE METABOLIC PANEL
ALT: 25
AST: 34
Albumin: 3.3 — ABNORMAL LOW
Alkaline Phosphatase: 176 — ABNORMAL HIGH
BUN: 7
CO2: 25
Calcium: 8.7
Chloride: 107
Creatinine, Ser: 0.77
GFR calc Af Amer: >60
GFR calc non Af Amer: >60
Glucose, Bld: 73
Potassium: 3.4 — ABNORMAL LOW
Sodium: 141
Total Bilirubin: 0.9
Total Protein: 6.6

## 2011-05-18 LAB — DIFFERENTIAL
Basophils Absolute: 0.1
Basophils Relative: 1
Eosinophils Absolute: 0.2
Eosinophils Relative: 2
Lymphocytes Relative: 12
Lymphs Abs: 1.1
Monocytes Absolute: 0.7
Monocytes Relative: 7
Neutro Abs: 7.1
Neutrophils Relative %: 77

## 2011-05-18 LAB — CARDIAC PANEL(CRET KIN+CKTOT+MB+TROPI)
CK, MB: 2.4
CK, MB: 2.5
Relative Index: INVALID
Relative Index: INVALID
Total CK: 87
Total CK: 93
Troponin I: 0.01
Troponin I: 0.01

## 2011-05-18 LAB — POCT I-STAT, CHEM 8
BUN: 10
Calcium, Ion: 1.13
Chloride: 106
Creatinine, Ser: 0.9
Glucose, Bld: 100 — ABNORMAL HIGH
HCT: 42
Hemoglobin: 14.3
Potassium: 3.6
Sodium: 141
TCO2: 26

## 2011-05-18 LAB — BLOOD GAS, ARTERIAL
Acid-base deficit: 2.6 — ABNORMAL HIGH
Bicarbonate: 22
Drawn by: 24610
O2 Content: 2
O2 Saturation: 93.2
Patient temperature: 98.6
TCO2: 23.3
pCO2 arterial: 40.1
pH, Arterial: 7.358
pO2, Arterial: 71.9 — ABNORMAL LOW

## 2011-05-18 LAB — POCT CARDIAC MARKERS
CKMB, poc: 1.3
Myoglobin, poc: 28.5
Operator id: 272551
Troponin i, poc: <0.05

## 2011-05-18 LAB — TROPONIN I: Troponin I: 0.01

## 2011-05-18 LAB — GRAM STAIN

## 2011-05-18 LAB — B-NATRIURETIC PEPTIDE (CONVERTED LAB): Pro B Natriuretic peptide (BNP): 165 — ABNORMAL HIGH

## 2011-05-18 LAB — MAGNESIUM: Magnesium: 2.1

## 2011-05-18 LAB — C1 ESTERASE INHIBITOR, FUNCTIONAL: C1INH Functional/C1INH Total MFr SerPl: >100 % (ref >=68)

## 2011-05-18 LAB — TSH: TSH: 3.395

## 2011-05-18 LAB — CK TOTAL AND CKMB (NOT AT ARMC)
CK, MB: 1.9
Relative Index: INVALID
Total CK: 72

## 2011-05-18 LAB — C4 COMPLEMENT: Complement C4, Body Fluid: 41

## 2011-05-18 LAB — D-DIMER, QUANTITATIVE: D-Dimer, Quant: 0.57 — ABNORMAL HIGH

## 2011-06-02 ENCOUNTER — Emergency Department (HOSPITAL_COMMUNITY): Payer: Medicare Other

## 2011-06-02 ENCOUNTER — Inpatient Hospital Stay (HOSPITAL_COMMUNITY)
Admission: EM | Admit: 2011-06-02 | Discharge: 2011-06-05 | DRG: 313 | Disposition: A | Discharge status: Home / Self Care | Payer: Medicare Other | Attending: Cardiovascular Disease | Admitting: Cardiovascular Disease

## 2011-06-02 DIAGNOSIS — E119 Type 2 diabetes mellitus without complications: Secondary | ICD-10-CM | POA: Diagnosis present

## 2011-06-02 DIAGNOSIS — Z9089 Acquired absence of other organs: Secondary | ICD-10-CM

## 2011-06-02 DIAGNOSIS — E785 Hyperlipidemia, unspecified: Secondary | ICD-10-CM | POA: Diagnosis present

## 2011-06-02 DIAGNOSIS — F411 Generalized anxiety disorder: Secondary | ICD-10-CM | POA: Diagnosis present

## 2011-06-02 DIAGNOSIS — J449 Chronic obstructive pulmonary disease, unspecified: Secondary | ICD-10-CM | POA: Diagnosis present

## 2011-06-02 DIAGNOSIS — J4489 Other specified chronic obstructive pulmonary disease: Secondary | ICD-10-CM | POA: Diagnosis present

## 2011-06-02 DIAGNOSIS — F430 Acute stress reaction: Secondary | ICD-10-CM | POA: Diagnosis present

## 2011-06-02 DIAGNOSIS — Z794 Long term (current) use of insulin: Secondary | ICD-10-CM

## 2011-06-02 DIAGNOSIS — Z7982 Long term (current) use of aspirin: Secondary | ICD-10-CM

## 2011-06-02 DIAGNOSIS — R0789 Other chest pain: Principal | ICD-10-CM | POA: Diagnosis present

## 2011-06-02 DIAGNOSIS — R079 Chest pain, unspecified: Secondary | ICD-10-CM

## 2011-06-02 DIAGNOSIS — Z8249 Family history of ischemic heart disease and other diseases of the circulatory system: Secondary | ICD-10-CM

## 2011-06-02 DIAGNOSIS — E669 Obesity, unspecified: Secondary | ICD-10-CM | POA: Diagnosis present

## 2011-06-02 DIAGNOSIS — I252 Old myocardial infarction: Secondary | ICD-10-CM

## 2011-06-02 DIAGNOSIS — I2510 Atherosclerotic heart disease of native coronary artery without angina pectoris: Secondary | ICD-10-CM | POA: Diagnosis present

## 2011-06-02 DIAGNOSIS — Z880 Allergy status to penicillin: Secondary | ICD-10-CM

## 2011-06-02 DIAGNOSIS — Z79899 Other long term (current) drug therapy: Secondary | ICD-10-CM

## 2011-06-02 DIAGNOSIS — Z9861 Coronary angioplasty status: Secondary | ICD-10-CM

## 2011-06-02 DIAGNOSIS — Z882 Allergy status to sulfonamides status: Secondary | ICD-10-CM

## 2011-06-02 LAB — DIFFERENTIAL
Basophils Absolute: 0.1 10*3/uL (ref 0.0–0.1)
Basophils Relative: 1 % (ref 0–1)
Eosinophils Absolute: 0.1 10*3/uL (ref 0.0–0.7)
Eosinophils Relative: 2 % (ref 0–5)
Lymphocytes Relative: 28 % (ref 12–46)
Lymphs Abs: 1.9 10*3/uL (ref 0.7–4.0)
Monocytes Absolute: 0.9 10*3/uL (ref 0.1–1.0)
Monocytes Relative: 14 % — ABNORMAL HIGH (ref 3–12)
Neutro Abs: 3.7 10*3/uL (ref 1.7–7.7)
Neutrophils Relative %: 56 % (ref 43–77)

## 2011-06-02 LAB — CBC
HCT: 39.2 % (ref 36.0–46.0)
Hemoglobin: 12.8 g/dL (ref 12.0–15.0)
MCH: 26.7 pg (ref 26.0–34.0)
MCHC: 32.7 g/dL (ref 30.0–36.0)
MCV: 81.7 fL (ref 78.0–100.0)
Platelets: 108 10*3/uL — ABNORMAL LOW (ref 150–400)
RBC: 4.8 MIL/uL (ref 3.87–5.11)
RDW: 15.7 % — ABNORMAL HIGH (ref 11.5–15.5)
WBC: 6.6 10*3/uL (ref 4.0–10.5)

## 2011-06-02 LAB — COMPREHENSIVE METABOLIC PANEL
ALT: 47 U/L — ABNORMAL HIGH (ref 0–35)
AST: 48 U/L — ABNORMAL HIGH (ref 0–37)
Albumin: 3.6 g/dL (ref 3.5–5.2)
Alkaline Phosphatase: 333 U/L — ABNORMAL HIGH (ref 39–117)
BUN: 12 mg/dL (ref 6–23)
CO2: 27 mEq/L (ref 19–32)
Calcium: 9.2 mg/dL (ref 8.4–10.5)
Chloride: 99 mEq/L (ref 96–112)
Creatinine, Ser: 0.67 mg/dL (ref 0.50–1.10)
GFR calc Af Amer: >90 mL/min (ref >90)
GFR calc non Af Amer: 84 mL/min — ABNORMAL LOW (ref >90)
Glucose, Bld: 178 mg/dL — ABNORMAL HIGH (ref 70–99)
Potassium: 3.6 mEq/L (ref 3.5–5.1)
Sodium: 136 mEq/L (ref 135–145)
Total Bilirubin: 0.4 mg/dL (ref 0.3–1.2)
Total Protein: 7.8 g/dL (ref 6.0–8.3)

## 2011-06-02 LAB — PROTIME-INR
INR: 1.04 (ref 0.00–1.49)
Prothrombin Time: 13.8 seconds (ref 11.6–15.2)

## 2011-06-02 LAB — POCT I-STAT TROPONIN I: Troponin i, poc: 0 ng/mL (ref 0.00–0.08)

## 2011-06-02 LAB — APTT: aPTT: 44 seconds — ABNORMAL HIGH (ref 24–37)

## 2011-06-03 DIAGNOSIS — R079 Chest pain, unspecified: Secondary | ICD-10-CM

## 2011-06-03 LAB — CARDIAC PANEL(CRET KIN+CKTOT+MB+TROPI)
CK, MB: 2 ng/mL (ref 0.3–4.0)
Relative Index: INVALID (ref 0.0–2.5)
Total CK: 64 U/L (ref 7–177)
Troponin I: <0.3 ng/mL (ref <0.30)

## 2011-06-03 LAB — GLUCOSE, CAPILLARY
Glucose-Capillary: 47 mg/dL — ABNORMAL LOW (ref 70–99)
Glucose-Capillary: 206 mg/dL — ABNORMAL HIGH (ref 70–99)
Glucose-Capillary: 213 mg/dL — ABNORMAL HIGH (ref 70–99)
Glucose-Capillary: 185 mg/dL — ABNORMAL HIGH (ref 70–99)
Glucose-Capillary: 148 mg/dL — ABNORMAL HIGH (ref 70–99)

## 2011-06-03 LAB — LIPID PANEL
Cholesterol: 138 mg/dL (ref 0–200)
HDL: 43 mg/dL (ref >39)
LDL Cholesterol: 54 mg/dL (ref 0–99)
Total CHOL/HDL Ratio: 3.2 RATIO
Triglycerides: 206 mg/dL — ABNORMAL HIGH (ref <150)
VLDL: 41 mg/dL — ABNORMAL HIGH (ref 0–40)

## 2011-06-03 LAB — HEPATIC FUNCTION PANEL
ALT: 47 U/L — ABNORMAL HIGH (ref 0–35)
AST: 51 U/L — ABNORMAL HIGH (ref 0–37)
Albumin: 3.1 g/dL — ABNORMAL LOW (ref 3.5–5.2)
Alkaline Phosphatase: 289 U/L — ABNORMAL HIGH (ref 39–117)
Bilirubin, Direct: 0.2 mg/dL (ref 0.0–0.3)
Indirect Bilirubin: 0.2 mg/dL — ABNORMAL LOW (ref 0.3–0.9)
Total Bilirubin: 0.4 mg/dL (ref 0.3–1.2)
Total Protein: 6.8 g/dL (ref 6.0–8.3)

## 2011-06-03 LAB — HEMOGLOBIN A1C
Hgb A1c MFr Bld: 8 % — ABNORMAL HIGH (ref <5.7)
Mean Plasma Glucose: 183 mg/dL — ABNORMAL HIGH (ref <117)

## 2011-06-03 LAB — CBC
HCT: 36.4 % (ref 36.0–46.0)
Hemoglobin: 11.8 g/dL — ABNORMAL LOW (ref 12.0–15.0)
MCH: 26.6 pg (ref 26.0–34.0)
MCHC: 32.4 g/dL (ref 30.0–36.0)
MCV: 82.2 fL (ref 78.0–100.0)
Platelets: 88 10*3/uL — ABNORMAL LOW (ref 150–400)
RBC: 4.43 MIL/uL (ref 3.87–5.11)
RDW: 16.1 % — ABNORMAL HIGH (ref 11.5–15.5)
WBC: 4.6 10*3/uL (ref 4.0–10.5)

## 2011-06-03 LAB — CK TOTAL AND CKMB (NOT AT ARMC)
CK, MB: 2.3 ng/mL (ref 0.3–4.0)
Relative Index: INVALID (ref 0.0–2.5)
Total CK: 94 U/L (ref 7–177)

## 2011-06-03 LAB — HEPARIN LEVEL (UNFRACTIONATED)
Heparin Unfractionated: 0.19 IU/mL — ABNORMAL LOW (ref 0.30–0.70)
Heparin Unfractionated: 0.32 IU/mL (ref 0.30–0.70)

## 2011-06-03 LAB — TROPONIN I: Troponin I: <0.3 ng/mL (ref <0.30)

## 2011-06-03 NOTE — H&P (Signed)
[name], [name]                  ACCOUNT NO.:  [id number]  MEDICAL RECORD NO.:  [id number]  LOCATION:  2014                         FACILITY:  [facility]  PHYSICIAN:  Shaune Pascal. Kadie Balestrieri, MDDATE OF BIRTH:  February 22, 1934  DATE OF ADMISSION:  06/02/2011 DATE OF DISCHARGE:                             HISTORY & PHYSICAL   PRIMARY CARE PHYSICIAN:  Myrtis Ser, MD, [place], [place].  CARDIOLOGIST:  Thayer Headings, MD  REASON FOR ADMISSION:  Recurrent chest pain.  [name] is a 77 year old woman with a history of COPD, diastolic dysfunction, hyperlipidemia, diabetes, obesity, and coronary artery disease.  She experienced inferior wall myocardial infarction in 1996 and underwent stenting of the RCA.  She apparently also has small, chronically totally occluded ramus which is too small to intervene on. Since that time, she has had frequent recurrence of chest pain and multiple admissions and catheterizations including 2001, 2004, 2006, 2007, and September 2010.  All these catheterizations have shown nonobstructive coronary artery disease.  The last cath in September 2010 showed a left main had 20% lesion, the LAD has 30% proximal lesion, diagonal had 30% ostial lesion, the left circumflex had a 10-20% diffuse stenosis.  There was a small ramus which was chronically occluded and the RCA had a patent and minor luminal irregularities .  Ejection fraction approximately  50%.  The last admission on July 2011 was for chest pain, she had a Myoview at that time which showed ejection fraction of 47% with an inferior scar, there was nothing significant.  She has chest pain off and on.  Recently, she has been under a lot of stress.  Her son committed suicide __________ 2012.  Over the past days she had several episodes of chest pain which occur at any time.  Today, she went to a movie and had chest pain off and on throughout the entire movie.  She went home and continued to have chest pain.   This was associated with nausea.  Daughter brought her to the emergency room. She continued to have 1-2/10 chest pain despite multiple interventions including nitroglycerin.  Cardiac enzymes and EKG are normal.  REVIEW OF SYSTEMS:  She does describe some edema which has been better recently as was occasional abdominal pain.  She has not had orthopnea, PND.  No focal neurologic symptoms.  No bleeding.  Remainder of review of systems are negative except per HPI and problem list.  PROBLEM LIST: 1. Coronary artery disease.     a.     Status post inferior wall myocardial infarction in 1996,      treated with stenting of the right coronary artery. 2. Recurrent chest pain as described above. 3. Hyperlipidemia. 4. Obesity. 5. Diastolic dysfunction. 6. Hypothyroidism. 7. Sleep apnea. 8. Anxiety.  CURRENT MEDICATIONS: 1. Vitamin D 50,000 units 1 capsule weekly. 2. Zofran 4 mg p.r.n. 3. Potassium 10 mEq daily. 4. NovoLog insulin 20 units 3 times a day with meals. 5. Sublingual nitroglycerin p.r.n. 6. Nexium 40 mg a day. 7. Lipitor 20 mg a day. 8. Synthroid 125 mcg a day. 9. Lantus insulin 45 units in a.m. and 70 units in p.m. 10.Lasix 40 mg a  day. 11.Flonase 2 sprays daily to each nostril as needed. 12.Carvedilol 12.5 mg b.i.d. 13.Aspirin 81 mg a day. 14.Xanax 0.5 to 1 mg a day. 15.Singulair 10 mg a day.  ALLERGIES:  SULFA AND PENICILLIN.  SOCIAL HISTORY:  __________  Denies tobacco or alcohol.  Her husband is quite ill in a  nursing home.  FAMILY HISTORY:  Positive for coronary artery disease.  PHYSICAL EXAMINATION:  GENERAL:  She is an obese female lying  in bed, in no acute distress. VITAL SIGNS:  Blood pressure 95/64, heart rate 65, saturating 99% on room air. HEENT:  Normal. NECK:  Supple and thick.  Unable to assess jugular venous pressure. Carotids are 2+ bilaterally.  There is no lymphadenopathy or thyromegaly appreciated. CARDIAC:  PMI is nonpalpable.  Distant  heart sounds.  Regular  with no obvious murmurs. LUNGS:  Clear anteriorly. ABDOMEN: Tender in the epigastrium.  There is no rebound or  guarding. Good  bowel sounds. EXTREMITIES:  Warm.  No cyanosis, clubbing, or edema.  Good pulses.  No rash. NEUROLOGIC:  Alert and oriented x3.  Cranial nerves II-XII intact, otherwise nonfocal.  EKG shows sinus rhythm at 67, no ST-T wave  abnormality.  Chest x-ray shows cardiomegaly.  No focal  airspace disease.  LABORATORY DATA:  Sodium 136, potassium 3.6, creatinine 0.67.  White count 6.6, hemoglobin 12.8, and platelets of 108.  Troponin 0.00.  ASSESSMENT: 1. Recurrent chest pain , question  unstable angina. 2. Coronary artery disease status post previous myocardial infarction     with percutaneous coronary intervention of right coronary artery in     1996. 3. Diabetes. 4. Hyperlipidemia. 5. Emotional stress attributing to son's suicide.  PLAN/DISCUSSION:  __________ unstable angina with no subjective signs __________ heparin, aspirin, statin, nitroglycerin .  With  multiple previous negative caths , I would likely favor  noninvasive workup unless chest pain persists or cardiac enzymes are positive.     Shaune Pascal. Odus Clasby, MD     DRB/MEDQ  D:  06/02/2011  T:  06/03/2011  Job:  [id number]  cc:   Myrtis Ser, MD Thayer Headings, M.D.  Electronically Signed by Glori Bickers MD on 06/03/2011 02:51:35 PM

## 2011-06-04 ENCOUNTER — Inpatient Hospital Stay (HOSPITAL_COMMUNITY): Payer: Medicare Other

## 2011-06-04 LAB — GLUCOSE, CAPILLARY
Glucose-Capillary: 73 mg/dL (ref 70–99)
Glucose-Capillary: 74 mg/dL (ref 70–99)
Glucose-Capillary: 130 mg/dL — ABNORMAL HIGH (ref 70–99)
Glucose-Capillary: 93 mg/dL (ref 70–99)
Glucose-Capillary: 249 mg/dL — ABNORMAL HIGH (ref 70–99)

## 2011-06-04 LAB — CBC
HCT: 37.5 % (ref 36.0–46.0)
Hemoglobin: 12.2 g/dL (ref 12.0–15.0)
MCH: 26.7 pg (ref 26.0–34.0)
MCHC: 32.5 g/dL (ref 30.0–36.0)
MCV: 82.1 fL (ref 78.0–100.0)
Platelets: 100 10*3/uL — ABNORMAL LOW (ref 150–400)
RBC: 4.57 MIL/uL (ref 3.87–5.11)
RDW: 16 % — ABNORMAL HIGH (ref 11.5–15.5)
WBC: 6.2 10*3/uL (ref 4.0–10.5)

## 2011-06-04 LAB — HEPARIN LEVEL (UNFRACTIONATED): Heparin Unfractionated: 0.44 IU/mL (ref 0.30–0.70)

## 2011-06-05 LAB — GLUCOSE, CAPILLARY
Glucose-Capillary: 181 mg/dL — ABNORMAL HIGH (ref 70–99)
Glucose-Capillary: 183 mg/dL — ABNORMAL HIGH (ref 70–99)

## 2011-06-05 LAB — CBC
HCT: 36.2 % (ref 36.0–46.0)
Hemoglobin: 11.8 g/dL — ABNORMAL LOW (ref 12.0–15.0)
MCH: 26.8 pg (ref 26.0–34.0)
MCHC: 32.6 g/dL (ref 30.0–36.0)
MCV: 82.3 fL (ref 78.0–100.0)
Platelets: 90 10*3/uL — ABNORMAL LOW (ref 150–400)
RBC: 4.4 MIL/uL (ref 3.87–5.11)
RDW: 16.3 % — ABNORMAL HIGH (ref 11.5–15.5)
WBC: 5.1 10*3/uL (ref 4.0–10.5)

## 2011-06-11 ENCOUNTER — Encounter: Payer: Self-pay | Admitting: *Deleted

## 2011-06-12 ENCOUNTER — Other Ambulatory Visit (HOSPITAL_COMMUNITY): Payer: Self-pay | Admitting: Cardiovascular Disease

## 2011-06-12 DIAGNOSIS — R079 Chest pain, unspecified: Secondary | ICD-10-CM

## 2011-06-13 ENCOUNTER — Other Ambulatory Visit (HOSPITAL_COMMUNITY): Payer: Medicare Other | Admitting: Radiology

## 2011-06-19 ENCOUNTER — Telehealth: Payer: Self-pay | Admitting: Cardiovascular Disease

## 2011-06-19 NOTE — Telephone Encounter (Signed)
We can check pulse ox w/wo os sitting and walking, msg left to have her provide the fax number of where info to be sent.

## 2011-06-19 NOTE — Telephone Encounter (Signed)
Pt wants to know if MD does pulse oximetry reassessment study. If so pt would like to have it done tomorrow when she comes in for her appt. If pt doesn't get this test and results sent to [facility] in [place] insurance would no longer pay for pt oxygen

## 2011-06-20 ENCOUNTER — Ambulatory Visit (HOSPITAL_COMMUNITY): Payer: Medicare Other | Attending: Internal Medicine | Admitting: Radiology

## 2011-06-20 ENCOUNTER — Ambulatory Visit (INDEPENDENT_AMBULATORY_CARE_PROVIDER_SITE_OTHER): Payer: Medicare Other | Admitting: Cardiovascular Disease

## 2011-06-20 ENCOUNTER — Encounter: Payer: Self-pay | Admitting: Cardiovascular Disease

## 2011-06-20 ENCOUNTER — Institutional Professional Consult (permissible substitution): Payer: Medicare Other | Admitting: Internal Medicine

## 2011-06-20 VITALS — Ht 61.0 in | Wt 190.0 lb

## 2011-06-20 DIAGNOSIS — J449 Chronic obstructive pulmonary disease, unspecified: Secondary | ICD-10-CM

## 2011-06-20 DIAGNOSIS — R079 Chest pain, unspecified: Secondary | ICD-10-CM | POA: Insufficient documentation

## 2011-06-20 DIAGNOSIS — R0789 Other chest pain: Secondary | ICD-10-CM

## 2011-06-20 DIAGNOSIS — I2510 Atherosclerotic heart disease of native coronary artery without angina pectoris: Secondary | ICD-10-CM

## 2011-06-20 MED ORDER — TECHNETIUM TC 99M TETROFOSMIN IV KIT
11.0000 | PACK | Freq: Once | INTRAVENOUS | Status: AC | PRN
  Administered 2011-06-20: 11 via INTRAVENOUS

## 2011-06-20 MED ORDER — TECHNETIUM TC 99M TETROFOSMIN IV KIT
33.0000 | PACK | Freq: Once | INTRAVENOUS | Status: AC | PRN
  Administered 2011-06-20: 33 via INTRAVENOUS

## 2011-06-20 MED ORDER — REGADENOSON 0.4 MG/5ML IV SOLN
0.4000 mg | Freq: Once | INTRAVENOUS | Status: AC
  Administered 2011-06-20: 0.4 mg via INTRAVENOUS

## 2011-06-20 NOTE — Progress Notes (Addendum)
[name] Date of Birth  [birth year] [facility] [address]    [place], [place]  [address] [phone]  Fax  [phone]  History of Present Illness:  77 year old female with a history of coronary artery disease. She has a history of an old inferior wall myocardial infarction. She also has a history of diabetes mellitus and hypothyroidism.  She was recently admitted to the hospital with increasing chest pain. She ruled out for myocardial infarction. He discharged her to home in satisfactory condition to be followed up as an outpatient.  She had a stress Myoview study today It reveals a large inferior lateral scar with only a small to moderate amount of redistribution and apical and lateral walls.. She has a known tight stenosis of a very small ramus intermediate branch that we are treating medically. I suspect this is responsible for this small amount of apical lateral redistribution.  The amount he seen on Myoview study correlates exactly with what her previous past 5 cardiac catheterizations have shown.  Current Outpatient Prescriptions on File Prior to Visit  Medication Sig Dispense Refill  . ALPRAZolam (XANAX) 0.5 MG tablet Take 0.5 mg by mouth as needed.       Marland Kitchen aspirin 325 MG tablet Take 81 mg by mouth daily.       Marland Kitchen atorvastatin (LIPITOR) 20 MG tablet Take 20 mg by mouth daily.        . carvedilol (COREG) 12.5 MG tablet Take 1 tablet (12.5 mg total) by mouth 2 (two) times daily.  60 tablet  11  . Esomeprazole Magnesium (NEXIUM PO) Take 40 mg by mouth daily.       . Fluticasone-Salmeterol (ADVAIR HFA IN) Inhale into the lungs 2 (two) times daily.        . furosemide (LASIX) 40 MG tablet Take 40 mg by mouth daily.        . insulin aspart (NOVOLOG) 100 UNIT/ML injection Inject 60 Units into the skin daily.       . insulin glargine (LANTUS) 100 UNIT/ML injection Inject into the skin at bedtime. 45 in the mornings and 70 at night      . levothyroxine (SYNTHROID,  LEVOTHROID) 125 MCG tablet Take 125 mcg by mouth daily.        . Montelukast Sodium (SINGULAIR PO) Take by mouth.        . nitroGLYCERIN (NITROSTAT) 0.4 MG SL tablet TAKE 1 TABLET UNDER THE TONGUE AS NEEDEDFOR CHEST PAIN (MAY REPEAT EVERY 5 MIN X3 DOSES, CALL 911 IF NO RELIEF.)  25 tablet  1  . Olopatadine HCl (PATANASE NA) Place into the nose 2 (two) times daily. 25       . ondansetron (ZOFRAN) 8 MG tablet Take 8 mg by mouth every 8 (eight) hours as needed.        . potassium chloride (KLOR-CON) 10 MEQ CR tablet Take 10 mEq by mouth daily.        . Promethazine HCl (PHENERGAN PO) Take by mouth as needed.         No current facility-administered medications on file prior to visit.    Allergies  Allergen Reactions  . Ace Inhibitors Cough  . Penicillins   . Sulfonamide Derivatives     Past Medical History  Diagnosis Date  . Coronary artery disease     status post inferior wall myocardial infarction  . Diabetes mellitus   . Obesity   . Leg edema   . Sleep apnea   .  Hypothyroidism   . Dyslipidemia   . Hypotension 08/02/2010    recent episodes of orthostatic hypotension  . Neuropathy of hand     left hand numbness/neuropathy    Past Surgical History  Procedure Date  . Cardiac catheterization     The ejection fraction is around 50%    History  Smoking status  . Never Smoker   Smokeless tobacco  . Not on file    History  Alcohol Use No    No family history on file.  Reviw of Systems:  Reviewed in the HPI.  All other systems are negative.  Physical Exam: BP 140/70  Pulse 67  Ht 5' 1.5" (1.562 m)  SpO2 96% The patient is alert and oriented x 3.  The mood and affect are normal.   Skin: warm and dry.  Color is normal.    HEENT:   Normocephalic, atraumatic. She is no JVD. Her carotids are normal.  Lungs: Clear  Heart: Regular rate S1-S2. There is a soft systolic murmur    Abdomen: Moderately obese. She has good bowel sounds.  Extremities:  No clubbing  cyanosis or edema  Neuro:  Nonfocal. Gait is normal.    ECG:  Assessment / Plan:

## 2011-06-20 NOTE — Patient Instructions (Signed)
Your physician wants you to follow-up in: 6 months or sooner if you need [place], You will receive a reminder letter in the mail two months in advance. If you don't receive a letter, please call our office to schedule the follow-up appointment.   Your physician recommends that you continue on your current medications as directed. Please refer to the Current Medication list given to you today.  Stress test results discussed in  Office visit

## 2011-06-20 NOTE — Assessment & Plan Note (Signed)
We have performed O2 saturation test at rest and with exercise. Her O2 saturation at rest was 96% on room air. After walking 100 feet her O2 saturation fell to 93% on room air.  This this point I do not think that she needs supplemental oxygen. We will have her see her pulmonologist for further recommendations.

## 2011-06-20 NOTE — Progress Notes (Signed)
[place] [address] [phone]  Cardiology Nuclear Med Study  [name] is a 77 y.o. female [id number] 01/13/1934   Nuclear Med Background Indication for Stress Test:  Evaluation for Ischemia, PTCA/Stent Patency and [facility] 06/05/11 CP- Negative Enzymes History:  COPD and '96 IWMI>PTCA/Stent-RCA; '10 Cath:Patent stent with residual N/O CAD, EF=50%  Cardiac Risk Factors: Hypertension, IDDM Type 2, Lipids and Obesity  Symptoms:  Chest Pain (last episode of chest discomfort was about 1.5 weeks ago), Diaphoresis and DOE/SOB   Nuclear Pre-Procedure Caffeine/Decaff Intake:  None NPO After: 9:00am   Lungs:  Clear.  O2 SAT 98% on RA IV 0.9% NS with Angio Cath:  22g  IV Site: R Hand  IV Started by:  Crissie Figures, RN  Chest Size (in):  44 Cup Size: B  Height: 5' 1"  (1.549 m)  Weight:  190 lb (86.183 kg)  BMI:  Body mass index is 35.90 kg/(m^2). Tech Comments:  BS @ 9:30 AM=168, Patient held Carvedilol this AM    Nuclear Med Study 1 or 2 day study: 1 day  Stress Test Type:  Carlton Adam  Reading MD: Glori Bickers, MD  Order Authorizing Provider:  Grayland Jack, MD  Resting Radionuclide: Technetium 27mTetrofosmin  Resting Radionuclide Dose: 11.0 mCi   Stress Radionuclide:  Technetium 989metrofosmin  Stress Radionuclide Dose: 33.0 mCi           Stress Protocol Rest HR: 63 Stress HR: 85  Rest BP: 127/48 Stress BP: 139/43  Exercise Time (min): n/a METS: n/a   Predicted Max HR: 146 bpm % Max HR: 58.22 bpm Rate Pressure Product: 11815   Dose of Adenosine (mg):  n/a Dose of Lexiscan: 0.4 mg  Dose of Atropine (mg): n/a Dose of Dobutamine: n/a mcg/kg/min (at max HR)  Stress Test Technologist: ShLetta MoynahanCMA-N  Nuclear Technologist:  StCharlton AmorCNMT     Rest Procedure:  Myocardial perfusion imaging was performed at rest 45 minutes following the intravenous administration of Technetium 9997metrofosmin.  Rest ECG: Prior IWMI.  Stress Procedure:  The patient received IV Lexiscan 0.4 mg over 15-seconds.  Technetium 28m40mrofosmin injected at 30-seconds.  There were no significant changes with Lexiscan.  She did c/o chest tightness with infusion.  Quantitative spect images were obtained after a 45 minute delay.  Stress ECG: No significant ST segment change suggestive of ischemia.  QPS Raw Data Images:  Acquisition technically good. Stress Images:  There is decreased uptake in the inferior wall and the mid/distal anterolateral wall. Rest Images:  There is decreased uptake in the inferior wall. Subtraction (SDS):  These findings are consistent with prior inferior infarct and mild ischemia in the mid/distal anterolateral wall. Transient Ischemic Dilatation (Normal <1.22):  1.24 Lung/Heart Ratio (Normal <0.45):  0.42  Quantitative Gated Spect Images QGS EDV:  85 ml QGS ESV:  28 ml QGS cine images:  Inferior akinesis; EF appears to be overestimated; suggest echocardiogram to more fully quantitate. QGS EF: 67%  Impression Exercise Capacity:  Lexiscan with no exercise. BP Response:  Normal blood pressure response. Clinical Symptoms:  There is chest pain. ECG Impression:  No significant ST segment change suggestive of ischemia. Comparison with Prior Nuclear Study: No images to compare  Overall Impression:  Abnormal stress nuclear study with a large, fixed defect in the inferior wall consistent with infarct; there is also a small reversible defect in the mid/distal anterolateral wall consistent with mild  ischemia.  Kirk Ruths

## 2011-06-20 NOTE — Assessment & Plan Note (Addendum)
[name] has known coronary artery disease. She's had a previous large inferior wall myocardial infarction. She has a very long but small diameter ramus intermediate vessel that has been occluded for years. It fills via collateral vessels. This provides a moderate area of her lateral wall.  Her stress Myoview study today reveals the presence of an inferior scar and reveals lateral redistribution. Her Myoview study is entirely consistent with her previous cardiac catheterizations that we performed over the past several years.  She was admitted with chest pains a month ago but in talking to her further, this was associated with intense personal stress. Her son committed suicide and she had been quite upset for the previous week or so.  At this point I don't think that we need to pursue cardiac catheterization. Myoview study is entirely consistent with what her previous cardiac catheterization showed several years ago. She's not having episodes of pain now. We'll continue with her same medications.

## 2011-06-22 NOTE — Discharge Summary (Signed)
[name], [name]                  ACCOUNT NO.:  [id number]  MEDICAL RECORD NO.:  [id number]  LOCATION:  2014                         FACILITY:  [facility]  PHYSICIAN:  Thayer Headings, M.D. DATE OF BIRTH:  Feb 27, 1934  DATE OF ADMISSION:  06/02/2011 DATE OF DISCHARGE:  06/05/2011                              DISCHARGE SUMMARY   PROCEDURES: 1. Portable chest x-ray. 2. Abdominal ultrasound.  FINAL DISCHARGE DIAGNOSES: 1. Chest pain, cardiac enzymes negative for myocardial infarction and     outpatient Myoview scheduled. 2. Chronic obstructive pulmonary disease. 3. History of diastolic dysfunction by echocardiogram. 4. History of hyperlipidemia. 5. Diabetes. 6. Obesity. 7. History of myocardial infarction with stent to the right coronary     artery in 1996. 8. Status post cardiac catheterization last in September 2010, showing     left main 20%, left anterior descending artery 40%, first diagonal     30%, circumflex 20%, ramus totalled which was old, and right     coronary artery luminal irregularities. 9. Mild left ventricular dysfunction with an ejection fraction of 50%     at catheterization in 2010. 10.Hypothyroidism. 11.Sleep apnea. 12.Anxiety. 13.Allergy or intolerance SULFA and PENICILLIN. 14.Family history of coronary artery disease.  TIME AT DISCHARGE:  Thirty six minutes.  HOSPITAL COURSE:  [name] is a 77 year old female with a history of coronary artery disease.  She had chest pain and came to the hospital where she was admitted for further evaluation and treatment.  Her cardiac enzymes were negative for MI.  Her hemoglobin A1c was 8. She had abnormal LFTs with an alkaline phosphatase of 333 and mild elevation in her SGOT and SGPT at 48 and 47 respectively.  A lipid profile showed triglycerides of 206, HDL 43, LDL 54.  An abdominal ultrasound was performed which showed steatosis and a likely right upper renal cyst.  On June 05, 2011, [name] was seen by  Dr. Mare Ferrari.  She was ambulating without chest pain or shortness of breath and considered stable for discharge, to follow up as an outpatient.  DISCHARGE INSTRUCTIONS: 1. Her activity level is to be increased gradually. 2. She is encouraged to stick to a low-sodium, diabetic diet. 3. She is to get a Lexiscan Myoview stress test on June 13, 2011,     at 11:45 and follow up with Dr. Acie Fredrickson on June 20, 2011, at     2:45. 4. She is to follow up with Dr. Volanda Napoleon as scheduled.  DISCHARGE MEDICATIONS: 1. Zofran 4 mg b.i.d. p.r.n. 2. Alprazolam 1 mg 1/2-1 tablet daily. 3. Coreg 12.5 mg b.i.d. 4. Flonase nasal spray daily p.r.n. 5. Lipitor 20 mg daily. 6. Lantus 45-70 units b.i.d. as prior to admission. 7. NovoLog 20 units t.i.d. before meals. 8. Singulair 10 mg a day. 9. Lasix 40 mg a day. 10.Sublingual nitroglycerin p.r.n. 11.Aspirin 81 mg daily. 12.Nexium 40 mg q.a.m. 13.Potassium 10 mEq a day. 14.Levothyroxine 125 mcg daily. 15.Vitamin D 50,000 units weekly.     Rosaria Ferries, PA-C   ______________________________ Thayer Headings, M.D.    RB/MEDQ  D:  06/05/2011  T:  06/05/2011  Job:  [id number]  cc:   Dr.  Volanda Napoleon  Electronically Signed by Rosaria Ferries PA-C on 06/21/2011 03:34:55 PM Electronically Signed by Mertie Moores M.D. on 06/22/2011 03:19:16 PM

## 2011-07-17 ENCOUNTER — Institutional Professional Consult (permissible substitution): Payer: Medicare Other | Admitting: Internal Medicine

## 2011-09-10 ENCOUNTER — Telehealth: Payer: Self-pay | Admitting: Cardiovascular Disease

## 2011-10-02 LAB — HM DEXA SCAN

## 2011-12-11 ENCOUNTER — Telehealth: Payer: Self-pay | Admitting: Cardiovascular Disease

## 2011-12-11 NOTE — Telephone Encounter (Signed)
Error

## 2011-12-12 ENCOUNTER — Other Ambulatory Visit: Payer: Self-pay | Admitting: *Deleted

## 2011-12-12 MED ORDER — CARVEDILOL 12.5 MG PO TABS: 12.5000 mg | ORAL_TABLET | Freq: Two times a day (BID) | ORAL | Status: DC

## 2011-12-17 ENCOUNTER — Ambulatory Visit (INDEPENDENT_AMBULATORY_CARE_PROVIDER_SITE_OTHER): Payer: Medicare Other | Admitting: Cardiovascular Disease

## 2011-12-17 ENCOUNTER — Encounter: Payer: Self-pay | Admitting: Cardiovascular Disease

## 2011-12-17 VITALS — BP 98/58 | HR 68 | Ht 61.5 in | Wt 194.1 lb

## 2011-12-17 DIAGNOSIS — I509 Heart failure, unspecified: Secondary | ICD-10-CM

## 2011-12-17 DIAGNOSIS — I2510 Atherosclerotic heart disease of native coronary artery without angina pectoris: Secondary | ICD-10-CM

## 2011-12-17 DIAGNOSIS — E785 Hyperlipidemia, unspecified: Secondary | ICD-10-CM

## 2011-12-17 DIAGNOSIS — I5032 Chronic diastolic (congestive) heart failure: Secondary | ICD-10-CM

## 2011-12-17 NOTE — Progress Notes (Signed)
[name] Date of Birth  [birth year] [facility] [address]    [place], [place]  [address] [phone]  Fax  [phone]  Problem list: 1. Coronary artery disease-status post inferior wall myocardial infarction 2. Diabetes mellitus 3. Obesity 4. Sleep apnea 5. Hypothyroidism 6 hyper lipidemia   History of Present Illness:  78 year old female with a history of coronary artery disease. She has a history of an old inferior wall myocardial infarction. She also has a history of diabetes mellitus and hypothyroidism.   She had a stress Myoview study Oct. 31, 2012  It reveals a large inferior lateral scar with only a small to moderate amount of redistribution and apical and lateral walls.. She has a known tight stenosis of a very small ramus intermediate branch that we are treating medically. I suspect this is responsible for this small amount of apical lateral redistribution.  The amount he seen on Myoview study correlates exactly with what her previous past 5 cardiac catheterizations have shown.  She has done well from a cardiac stanpoint.  Denies any chest pain or dyspnea.   Current Outpatient Prescriptions on File Prior to Visit  Medication Sig Dispense Refill  . ALPRAZolam (XANAX) 0.5 MG tablet Take 0.5 mg by mouth as needed.       Marland Kitchen aspirin 325 MG tablet Take 81 mg by mouth daily.       Marland Kitchen atorvastatin (LIPITOR) 20 MG tablet Take 20 mg by mouth daily.        . Calcium Carbonate-Vitamin D (CALCIUM + D PO) Take by mouth daily.      . carvedilol (COREG) 12.5 MG tablet Take 1 tablet (12.5 mg total) by mouth 2 (two) times daily.  60 tablet  6  . escitalopram (LEXAPRO) 10 MG tablet Take 10 mg by mouth daily.      . Esomeprazole Magnesium (NEXIUM PO) Take 40 mg by mouth daily.       . Fluticasone-Salmeterol (ADVAIR HFA IN) Inhale into the lungs 2 (two) times daily.        . furosemide (LASIX) 40 MG tablet Take 40 mg by mouth daily.        . insulin aspart (NOVOLOG)  100 UNIT/ML injection Inject 30 Units into the skin daily.       . insulin glargine (LANTUS) 100 UNIT/ML injection Inject into the skin at bedtime. 45 in the mornings and 70 at night      . levothyroxine (SYNTHROID, LEVOTHROID) 125 MCG tablet Take 125 mcg by mouth daily.        . Montelukast Sodium (SINGULAIR PO) Take 10 mg by mouth.       . nitroGLYCERIN (NITROSTAT) 0.4 MG SL tablet TAKE 1 TABLET UNDER THE TONGUE AS NEEDEDFOR CHEST PAIN (MAY REPEAT EVERY 5 MIN X3 DOSES, CALL 911 IF NO RELIEF.)  25 tablet  1  . Olopatadine HCl (PATANASE NA) Place into the nose 2 (two) times daily. 25       . ondansetron (ZOFRAN) 8 MG tablet Take 8 mg by mouth every 8 (eight) hours as needed.        Marland Kitchen oxybutynin (OXYTROL) 3.9 MG/24HR Place 1 patch onto the skin 2 (two) times a week.      . potassium chloride (KLOR-CON) 10 MEQ CR tablet Take 10 mEq by mouth daily.        . Promethazine HCl (PHENERGAN PO) Take by mouth as needed.          Allergies  Allergen Reactions  . Ace Inhibitors Cough  . Penicillins   . Sulfonamide Derivatives     Past Medical History  Diagnosis Date  . Coronary artery disease     status post inferior wall myocardial infarction  . Diabetes mellitus   . Obesity   . Leg edema   . Sleep apnea   . Hypothyroidism   . Dyslipidemia   . Hypotension 08/02/2010    recent episodes of orthostatic hypotension  . Neuropathy of hand     left hand numbness/neuropathy    Past Surgical History  Procedure Date  . Cardiac catheterization     The ejection fraction is around 50%    History  Smoking status  . Never Smoker   Smokeless tobacco  . Not on file    History  Alcohol Use No    No family history on file.  Reviw of Systems:  Reviewed in the HPI.  All other systems are negative.  Physical Exam: BP 98/58  Pulse 68  Ht 5' 1.5" (1.562 m)  Wt 194 lb 1.9 oz (88.052 kg)  BMI 36.08 kg/m2 The patient is alert and oriented x 3.  The mood and affect are normal.   Skin: warm  and dry.  Color is normal.    HEENT:   Normocephalic, atraumatic. She is no JVD. Her carotids are normal.  Lungs: Clear  Heart: Regular rate S1-S2. There is a soft systolic murmur    Abdomen: Moderately obese. She has good bowel sounds.  Extremities:  No clubbing cyanosis or edema  Neuro:  Nonfocal. Gait is normal.    ECG:  Assessment / Plan:

## 2011-12-17 NOTE — Assessment & Plan Note (Signed)
She continues to have chronic shortness of breath mostly due to her obesity and diastolic dysfunction. I've encouraged her to work on a good diet and exercise program. We'll see her back in 6 months.

## 2011-12-17 NOTE — Patient Instructions (Addendum)
Your physician wants you to follow-up in:6 months You will receive a reminder letter in the mail two months in advance. If you don't receive a letter, please call our office to schedule the follow-up appointment.   Your physician recommends that you return for a FASTING lipid profile: per protocol for accelerate study

## 2011-12-17 NOTE — Assessment & Plan Note (Signed)
[name] is doing well. She's not had any episodes of chest pain or shortness of breath. We'll check fasting labs on her today. I'll see her back in 6 months for an office visit, EKG, and fasting labs.  I've encouraged her to work on a good diet and exercise program in an effort to lose weight. She admits that her glucose levels have been slightly elevated.

## 2011-12-19 ENCOUNTER — Telehealth: Payer: Self-pay | Admitting: *Deleted

## 2011-12-19 NOTE — Telephone Encounter (Signed)
PT DID NOT QUALIFY FOR ACCELERATE PROGRAM STUDY, LAB APP SET.

## 2011-12-20 ENCOUNTER — Other Ambulatory Visit (INDEPENDENT_AMBULATORY_CARE_PROVIDER_SITE_OTHER): Payer: Medicare Other

## 2011-12-20 DIAGNOSIS — I2510 Atherosclerotic heart disease of native coronary artery without angina pectoris: Secondary | ICD-10-CM

## 2011-12-20 LAB — BASIC METABOLIC PANEL
BUN: 13 mg/dL (ref 6–23)
CO2: 27 mEq/L (ref 19–32)
Calcium: 9.1 mg/dL (ref 8.4–10.5)
Chloride: 106 mEq/L (ref 96–112)
Creatinine, Ser: 0.9 mg/dL (ref 0.4–1.2)
GFR: 66.51 mL/min (ref >60.00)
Glucose, Bld: 118 mg/dL — ABNORMAL HIGH (ref 70–99)
Potassium: 4.2 mEq/L (ref 3.5–5.1)
Sodium: 141 mEq/L (ref 135–145)

## 2011-12-20 LAB — LIPID PANEL
Cholesterol: 147 mg/dL (ref 0–200)
HDL: 61.9 mg/dL (ref >39.00)
LDL Cholesterol: 58 mg/dL (ref 0–99)
Total CHOL/HDL Ratio: 2
Triglycerides: 136 mg/dL (ref 0.0–149.0)
VLDL: 27.2 mg/dL (ref 0.0–40.0)

## 2011-12-20 LAB — HEPATIC FUNCTION PANEL
ALT: 42 U/L — ABNORMAL HIGH (ref 0–35)
AST: 42 U/L — ABNORMAL HIGH (ref 0–37)
Albumin: 3.5 g/dL (ref 3.5–5.2)
Alkaline Phosphatase: 232 U/L — ABNORMAL HIGH (ref 39–117)
Bilirubin, Direct: 0.1 mg/dL (ref 0.0–0.3)
Total Bilirubin: 0.7 mg/dL (ref 0.3–1.2)
Total Protein: 7.2 g/dL (ref 6.0–8.3)

## 2012-01-07 DIAGNOSIS — R600 Localized edema: Secondary | ICD-10-CM | POA: Insufficient documentation

## 2012-01-30 ENCOUNTER — Emergency Department (HOSPITAL_COMMUNITY): Payer: Medicare Other

## 2012-01-30 ENCOUNTER — Encounter (HOSPITAL_COMMUNITY): Payer: Self-pay | Admitting: Emergency Medicine

## 2012-01-30 ENCOUNTER — Emergency Department (HOSPITAL_COMMUNITY)
Admission: EM | Admit: 2012-01-30 | Discharge: 2012-01-30 | Disposition: A | Discharge status: Home / Self Care | Payer: Medicare Other | Attending: Emergency Medicine | Admitting: Emergency Medicine

## 2012-01-30 DIAGNOSIS — R209 Unspecified disturbances of skin sensation: Secondary | ICD-10-CM | POA: Insufficient documentation

## 2012-01-30 DIAGNOSIS — E119 Type 2 diabetes mellitus without complications: Secondary | ICD-10-CM | POA: Insufficient documentation

## 2012-01-30 DIAGNOSIS — Z79899 Other long term (current) drug therapy: Secondary | ICD-10-CM | POA: Insufficient documentation

## 2012-01-30 DIAGNOSIS — R200 Anesthesia of skin: Secondary | ICD-10-CM

## 2012-01-30 DIAGNOSIS — I2510 Atherosclerotic heart disease of native coronary artery without angina pectoris: Secondary | ICD-10-CM | POA: Insufficient documentation

## 2012-01-30 DIAGNOSIS — G319 Degenerative disease of nervous system, unspecified: Secondary | ICD-10-CM | POA: Insufficient documentation

## 2012-01-30 DIAGNOSIS — R202 Paresthesia of skin: Secondary | ICD-10-CM

## 2012-01-30 DIAGNOSIS — E039 Hypothyroidism, unspecified: Secondary | ICD-10-CM | POA: Insufficient documentation

## 2012-01-30 DIAGNOSIS — K7460 Unspecified cirrhosis of liver: Secondary | ICD-10-CM | POA: Insufficient documentation

## 2012-01-30 DIAGNOSIS — E669 Obesity, unspecified: Secondary | ICD-10-CM | POA: Insufficient documentation

## 2012-01-30 DIAGNOSIS — E785 Hyperlipidemia, unspecified: Secondary | ICD-10-CM | POA: Insufficient documentation

## 2012-01-30 HISTORY — DX: Unspecified cirrhosis of liver: K74.60

## 2012-01-30 LAB — CBC
HCT: 41 % (ref 36.0–46.0)
Hemoglobin: 13.3 g/dL (ref 12.0–15.0)
MCH: 28 pg (ref 26.0–34.0)
MCHC: 32.4 g/dL (ref 30.0–36.0)
MCV: 86.3 fL (ref 78.0–100.0)
Platelets: 75 10*3/uL — ABNORMAL LOW (ref 150–400)
RBC: 4.75 MIL/uL (ref 3.87–5.11)
RDW: 15.6 % — ABNORMAL HIGH (ref 11.5–15.5)
WBC: 5.8 10*3/uL (ref 4.0–10.5)

## 2012-01-30 LAB — COMPREHENSIVE METABOLIC PANEL
ALT: 39 U/L — ABNORMAL HIGH (ref 0–35)
AST: 42 U/L — ABNORMAL HIGH (ref 0–37)
Albumin: 3.2 g/dL — ABNORMAL LOW (ref 3.5–5.2)
Alkaline Phosphatase: 268 U/L — ABNORMAL HIGH (ref 39–117)
BUN: 12 mg/dL (ref 6–23)
CO2: 25 mEq/L (ref 19–32)
Calcium: 9 mg/dL (ref 8.4–10.5)
Chloride: 102 mEq/L (ref 96–112)
Creatinine, Ser: 1 mg/dL (ref 0.50–1.10)
GFR calc Af Amer: 62 mL/min — ABNORMAL LOW (ref >90)
GFR calc non Af Amer: 54 mL/min — ABNORMAL LOW (ref >90)
Glucose, Bld: 136 mg/dL — ABNORMAL HIGH (ref 70–99)
Potassium: 3.9 mEq/L (ref 3.5–5.1)
Sodium: 138 mEq/L (ref 135–145)
Total Bilirubin: 0.3 mg/dL (ref 0.3–1.2)
Total Protein: 7 g/dL (ref 6.0–8.3)

## 2012-01-30 LAB — POCT I-STAT, CHEM 8
BUN: 12 mg/dL (ref 6–23)
Calcium, Ion: 1.13 mmol/L (ref 1.12–1.32)
Chloride: 103 mEq/L (ref 96–112)
Creatinine, Ser: 1 mg/dL (ref 0.50–1.10)
Glucose, Bld: 138 mg/dL — ABNORMAL HIGH (ref 70–99)
HCT: 42 % (ref 36.0–46.0)
Hemoglobin: 14.3 g/dL (ref 12.0–15.0)
Potassium: 3.7 mEq/L (ref 3.5–5.1)
Sodium: 140 mEq/L (ref 135–145)
TCO2: 24 mmol/L (ref 0–100)

## 2012-01-30 LAB — DIFFERENTIAL
Basophils Absolute: 0.1 10*3/uL (ref 0.0–0.1)
Basophils Relative: 1 % (ref 0–1)
Eosinophils Absolute: 0.1 10*3/uL (ref 0.0–0.7)
Eosinophils Relative: 2 % (ref 0–5)
Lymphocytes Relative: 27 % (ref 12–46)
Lymphs Abs: 1.6 10*3/uL (ref 0.7–4.0)
Monocytes Absolute: 0.6 10*3/uL (ref 0.1–1.0)
Monocytes Relative: 11 % (ref 3–12)
Neutro Abs: 3.4 10*3/uL (ref 1.7–7.7)
Neutrophils Relative %: 59 % (ref 43–77)

## 2012-01-30 LAB — CK TOTAL AND CKMB (NOT AT ARMC)
CK, MB: 1.9 ng/mL (ref 0.3–4.0)
Relative Index: INVALID (ref 0.0–2.5)
Total CK: 65 U/L (ref 7–177)

## 2012-01-30 LAB — RAPID URINE DRUG SCREEN, HOSP PERFORMED
Amphetamines: NOT DETECTED
Barbiturates: NOT DETECTED
Benzodiazepines: NOT DETECTED
Cocaine: NOT DETECTED
Opiates: NOT DETECTED
Tetrahydrocannabinol: NOT DETECTED

## 2012-01-30 LAB — PROTIME-INR
INR: 0.97 (ref 0.00–1.49)
Prothrombin Time: 13.1 seconds (ref 11.6–15.2)

## 2012-01-30 LAB — GLUCOSE, CAPILLARY: Glucose-Capillary: 131 mg/dL — ABNORMAL HIGH (ref 70–99)

## 2012-01-30 LAB — APTT: aPTT: 39 seconds — ABNORMAL HIGH (ref 24–37)

## 2012-01-30 LAB — TROPONIN I: Troponin I: <0.3 ng/mL (ref <0.30)

## 2012-01-30 NOTE — ED Notes (Signed)
Notified pt that a urine sample was needed.  Pt stated that at this time she did not have to urinate.

## 2012-01-30 NOTE — ED Notes (Signed)
MD at bedside. Dr. Koh[place] at bedside.  

## 2012-01-30 NOTE — Discharge Instructions (Signed)
Paresthesia Paresthesia is a burning or prickling feeling. This feeling can happen in any part of the body. It often happens in the hands, arms, legs, or feet. HOME CARE  Avoid drinking alcohol.   Try massage or needle therapy (acupuncture) to help with your problems.   Keep all doctor visits as told.  GET HELP RIGHT AWAY IF:   You feel weak.   You have trouble walking or moving.   You have problems speaking or seeing.   You feel confused.   You cannot control when you poop (bowel movement) or pee (urinate).   You lose feeling (numbness) after an injury.   You pass out (faint).   Your burning or prickling feeling gets worse when you walk.   You have pain, cramps, or feel dizzy.   You have a rash.  MAKE SURE YOU:   Understand these instructions.   Will watch your condition.   Will get help right away if you are not doing well or get worse.  Document Released: 07/19/2008 Document Revised: 07/26/2011 Document Reviewed: 04/27/2011 [facility] Patient Information 2012 [place].

## 2012-01-30 NOTE — ED Notes (Signed)
Pt returned from MRI scan.

## 2012-01-30 NOTE — ED Notes (Addendum)
Pt was in MRI during hourly rounding.

## 2012-01-30 NOTE — ED Notes (Signed)
Dr Wilson Singer at bedside to r/o Stroke

## 2012-01-30 NOTE — ED Notes (Signed)
Pt here from MD office for c/o numbness in the lt side x1week denies loc ,but stated that she done has some confusion at time

## 2012-01-30 NOTE — ED Notes (Signed)
Patient transported to MRI 

## 2012-02-01 NOTE — ED Provider Notes (Signed)
History    78 year old female with intermittent numbness and tingling in the left upper extremity. Gradual onset about a week ago. Episodes last anywhere from minutes to several hours. No appreciable exacerbating relieving factors. No weakness. No acute visual changes. Does not feel off balance. Associated with any pain. No shortness of breath. No recent medication changes. CSN: [id number]  Arrival date & time 01/30/12  1745   First MD Initiated Contact with Patient 01/30/12 1821      Chief Complaint  Patient presents with  . Numbness    pt c/o numbness on lt side arm and leg x1 wk    (Consider location/radiation/quality/duration/timing/severity/associated sxs/prior treatment) HPI  Past Medical History  Diagnosis Date  . Coronary artery disease     status post inferior wall myocardial infarction  . Diabetes mellitus   . Obesity   . Leg edema   . Sleep apnea   . Hypothyroidism   . Dyslipidemia   . Hypotension 08/02/2010    recent episodes of orthostatic hypotension  . Neuropathy of hand     left hand numbness/neuropathy  . Cirrhosis, non-alcoholic     Past Surgical History  Procedure Date  . Cardiac catheterization     The ejection fraction is around 50%    No family history on file.  History  Substance Use Topics  . Smoking status: Never Smoker   . Smokeless tobacco: Not on file  . Alcohol Use: No    OB History    Grav Para Term Preterm Abortions TAB SAB Ect Mult Living                  Review of Systems   Review of symptoms negative unless otherwise noted in HPI.  Allergies  Ace inhibitors; Penicillins; and Sulfonamide derivatives  Home Medications   Current Outpatient Rx  Name Route Sig Dispense Refill  . ALPRAZOLAM 0.5 MG PO TABS Oral Take 0.5 mg by mouth as needed. anxiety    . ASPIRIN EC 81 MG PO TBEC Oral Take 81 mg by mouth daily.    . ATORVASTATIN CALCIUM 20 MG PO TABS Oral Take 20 mg by mouth daily.      Marland Kitchen CALCIUM + D PO Oral Take 1  tablet by mouth daily.     Marland Kitchen CARVEDILOL 12.5 MG PO TABS Oral Take 1 tablet (12.5 mg total) by mouth 2 (two) times daily. 60 tablet 6  . ESCITALOPRAM OXALATE 10 MG PO TABS Oral Take 10 mg by mouth daily as needed.     Marland Kitchen NEXIUM PO Oral Take 40 mg by mouth daily.     . FUROSEMIDE 40 MG PO TABS Oral Take 40 mg by mouth daily.      . INSULIN ASPART 100 UNIT/ML [place] SOLN Subcutaneous Inject 30 Units into the skin 3 (three) times daily before meals.     . INSULIN GLARGINE 100 UNIT/ML [place] SOLN Subcutaneous Inject 45-55 Units into the skin See admin instructions. 55 in the mornings and 45 at night    . LEVOTHYROXINE SODIUM 125 MCG PO TABS Oral Take 125 mcg by mouth daily.      Marland Kitchen SINGULAIR PO Oral Take 10 mg by mouth.     Marland Kitchen PATANASE NA Nasal Place 1 spray into the nose daily. 25    . ONDANSETRON HCL 8 MG PO TABS Oral Take 8 mg by mouth every 8 (eight) hours as needed. nausea    . POTASSIUM CHLORIDE 10 MEQ PO TBCR Oral Take 10 mEq  by mouth daily.        BP 133/56  Pulse 64  Temp 97.9 F (36.6 C) (Oral)  Resp 17  SpO2 96%  Physical Exam  Nursing note and vitals reviewed. Constitutional: She is oriented to person, place, and time. She appears well-developed and well-nourished. No distress.       Laying in bed. No acute distress. Obese  HENT:  Head: Normocephalic and atraumatic.  Eyes: Conjunctivae are normal. Right eye exhibits no discharge. Left eye exhibits no discharge.  Neck: Neck supple.  Cardiovascular: Normal rate, regular rhythm and normal heart sounds.  Exam reveals no gallop and no friction rub.   No murmur heard. Pulmonary/Chest: Effort normal and breath sounds normal. No respiratory distress.  Abdominal: Soft. She exhibits no distension. There is no tenderness.  Musculoskeletal: She exhibits no edema and no tenderness.       No midline spinal tenderness. No increased symptoms with range of motion of patient's shoulder or neck  Neurological: She is alert and oriented to person, place,  and time. No cranial nerve deficit. She exhibits normal muscle tone. Coordination normal.       Speech is clear and content appropriate. Cranial nerves are intact. Strength is 5 out of 5 bilateral upper lower extremities. Sensation is intact to light touch. Good finger to nose testing bilaterally. Biceps and patellar reflexes are one plus bilaterally. Patient is able to correctly identify objects around the room. Visual fields are intact to confrontation.  Skin: Skin is warm and dry.  Psychiatric: She has a normal mood and affect. Her behavior is normal. Thought content normal.    ED Course  Procedures (including critical care time)  Labs Reviewed  APTT - Abnormal; Notable for the following:    aPTT 39 (*)     All other components within normal limits  CBC - Abnormal; Notable for the following:    RDW 15.6 (*)     Platelets 75 (*)     All other components within normal limits  COMPREHENSIVE METABOLIC PANEL - Abnormal; Notable for the following:    Glucose, Bld 136 (*)     Albumin 3.2 (*)     AST 42 (*)     ALT 39 (*)     Alkaline Phosphatase 268 (*)     GFR calc non Af Amer 54 (*)     GFR calc Af Amer 62 (*)     All other components within normal limits  GLUCOSE, CAPILLARY - Abnormal; Notable for the following:    Glucose-Capillary 131 (*)     All other components within normal limits  POCT I-STAT, CHEM 8 - Abnormal; Notable for the following:    Glucose, Bld 138 (*)     All other components within normal limits  PROTIME-INR  DIFFERENTIAL  CK TOTAL AND CKMB  TROPONIN I  URINE RAPID DRUG SCREEN (HOSP PERFORMED)  LAB REPORT - SCANNED   Ct Head Wo Contrast  01/30/2012  *RADIOLOGY REPORT*  Clinical Data: Numbness left side of face for 1 week  CT HEAD WITHOUT CONTRAST  Technique:  Contiguous axial images were obtained from the base of the skull through the vertex without contrast.  Comparison: None.  Findings: There is no evidence for acute infarction, intracranial hemorrhage, mass  lesion, hydrocephalus, or extra-axial fluid.  Mild to moderate atrophy is present.  There is chronic microvascular ischemic change in the periventricular and subcortical white matter.  The calvarium is intact.  Clear sinuses and mastoids. Vascular calcification.  IMPRESSION: Atrophy.  Chronic microvascular ischemic change.  No acute stroke or bleed.  Original Report Authenticated By: Staci Righter, M.D.   Mr Brain Wo Contrast  01/30/2012  *RADIOLOGY REPORT*  Clinical Data: Left arm and hand numbness.  Headache.  Confusion.  MRI HEAD WITHOUT CONTRAST  Technique:  Multiplanar, multiecho pulse sequences of the brain and surrounding structures were obtained according to standard protocol without intravenous contrast.  Comparison: Head CT same day  Findings: The brainstem and cerebellum are normal.  The cerebral hemispheres show a few old small vessel insults within the deep and subcortical white matter.  No acute or subacute infarction.  No mass lesion, hemorrhage, hydrocephalus or extra-axial collection. No pituitary mass.  No inflammatory sinus disease.  No skull or skull base lesion.  IMPRESSION: No acute finding.  Mild chronic small vessel change of the hemispheric white matter, less than often seen in healthy individuals of this age.  Original Report Authenticated By: Jules Schick, M.D.   EKG:  Rhythm: Sinus bradycardia Rate: 59 Axis: Normal Intervals/conduction: Inferior Q waves ST segments: Nonspecific ST changes Comparison: Q waves noted on previous.   1. Numbness and tingling in left arm       MDM  78 year old female with numbness and tingling in her left side. She has a nonfocal neurological examination. CT head and subsequent MRI negative for stroke. Workup and exam overall fairly unremarkable aside from thrombocytopenia which per review of records she has a history of. Suspect that this is more likely a peripheral neuropathy. Patient has no focal motor weakness. No indication for  emergent imaging of her spine. Return precautions were discussed. Outpatient followup.        Virgel Manifold, MD 02/01/12 [phone]

## 2012-04-24 ENCOUNTER — Emergency Department (HOSPITAL_COMMUNITY): Payer: Medicare Other

## 2012-04-24 ENCOUNTER — Emergency Department (HOSPITAL_COMMUNITY)
Admission: EM | Admit: 2012-04-24 | Discharge: 2012-04-24 | Disposition: A | Discharge status: Home / Self Care | Payer: Medicare Other | Attending: Emergency Medicine | Admitting: Emergency Medicine

## 2012-04-24 ENCOUNTER — Encounter (HOSPITAL_COMMUNITY): Payer: Self-pay | Admitting: Emergency Medicine

## 2012-04-24 DIAGNOSIS — M545 Low back pain, unspecified: Secondary | ICD-10-CM | POA: Insufficient documentation

## 2012-04-24 DIAGNOSIS — E1142 Type 2 diabetes mellitus with diabetic polyneuropathy: Secondary | ICD-10-CM | POA: Insufficient documentation

## 2012-04-24 DIAGNOSIS — I2510 Atherosclerotic heart disease of native coronary artery without angina pectoris: Secondary | ICD-10-CM | POA: Insufficient documentation

## 2012-04-24 DIAGNOSIS — E785 Hyperlipidemia, unspecified: Secondary | ICD-10-CM | POA: Insufficient documentation

## 2012-04-24 DIAGNOSIS — E039 Hypothyroidism, unspecified: Secondary | ICD-10-CM | POA: Insufficient documentation

## 2012-04-24 DIAGNOSIS — Z79899 Other long term (current) drug therapy: Secondary | ICD-10-CM | POA: Insufficient documentation

## 2012-04-24 DIAGNOSIS — E1149 Type 2 diabetes mellitus with other diabetic neurological complication: Secondary | ICD-10-CM | POA: Insufficient documentation

## 2012-04-24 DIAGNOSIS — R109 Unspecified abdominal pain: Secondary | ICD-10-CM

## 2012-04-24 DIAGNOSIS — K7460 Unspecified cirrhosis of liver: Secondary | ICD-10-CM | POA: Insufficient documentation

## 2012-04-24 DIAGNOSIS — Z794 Long term (current) use of insulin: Secondary | ICD-10-CM | POA: Insufficient documentation

## 2012-04-24 DIAGNOSIS — M549 Dorsalgia, unspecified: Secondary | ICD-10-CM

## 2012-04-24 LAB — CBC WITH DIFFERENTIAL/PLATELET
Basophils Absolute: 0.1 10*3/uL (ref 0.0–0.1)
Basophils Relative: 1 % (ref 0–1)
Eosinophils Absolute: 0.1 10*3/uL (ref 0.0–0.7)
Eosinophils Relative: 2 % (ref 0–5)
HCT: 43.6 % (ref 36.0–46.0)
Hemoglobin: 14.2 g/dL (ref 12.0–15.0)
Lymphocytes Relative: 22 % (ref 12–46)
Lymphs Abs: 1.2 10*3/uL (ref 0.7–4.0)
MCH: 28 pg (ref 26.0–34.0)
MCHC: 32.6 g/dL (ref 30.0–36.0)
MCV: 85.8 fL (ref 78.0–100.0)
Monocytes Absolute: 0.5 10*3/uL (ref 0.1–1.0)
Monocytes Relative: 9 % (ref 3–12)
Neutro Abs: 3.7 10*3/uL (ref 1.7–7.7)
Neutrophils Relative %: 66 % (ref 43–77)
Platelets: 90 10*3/uL — ABNORMAL LOW (ref 150–400)
RBC: 5.08 MIL/uL (ref 3.87–5.11)
RDW: 15.6 % — ABNORMAL HIGH (ref 11.5–15.5)
WBC: 5.6 10*3/uL (ref 4.0–10.5)

## 2012-04-24 LAB — URINALYSIS, ROUTINE W REFLEX MICROSCOPIC
Bilirubin Urine: NEGATIVE
Glucose, UA: NEGATIVE mg/dL
Hgb urine dipstick: NEGATIVE
Ketones, ur: NEGATIVE mg/dL
Leukocytes, UA: NEGATIVE
Nitrite: NEGATIVE
Protein, ur: NEGATIVE mg/dL
Specific Gravity, Urine: 1.007 (ref 1.005–1.030)
Urobilinogen, UA: 0.2 mg/dL (ref 0.0–1.0)
pH: 7 (ref 5.0–8.0)

## 2012-04-24 LAB — COMPREHENSIVE METABOLIC PANEL
ALT: 29 U/L (ref 0–35)
AST: 39 U/L — ABNORMAL HIGH (ref 0–37)
Albumin: 3.4 g/dL — ABNORMAL LOW (ref 3.5–5.2)
Alkaline Phosphatase: 255 U/L — ABNORMAL HIGH (ref 39–117)
BUN: 11 mg/dL (ref 6–23)
CO2: 27 mEq/L (ref 19–32)
Calcium: 9.1 mg/dL (ref 8.4–10.5)
Chloride: 102 mEq/L (ref 96–112)
Creatinine, Ser: 0.89 mg/dL (ref 0.50–1.10)
GFR calc Af Amer: 72 mL/min — ABNORMAL LOW (ref >90)
GFR calc non Af Amer: 62 mL/min — ABNORMAL LOW (ref >90)
Glucose, Bld: 136 mg/dL — ABNORMAL HIGH (ref 70–99)
Potassium: 4.1 mEq/L (ref 3.5–5.1)
Sodium: 138 mEq/L (ref 135–145)
Total Bilirubin: 0.7 mg/dL (ref 0.3–1.2)
Total Protein: 7.6 g/dL (ref 6.0–8.3)

## 2012-04-24 MED ORDER — TRAMADOL HCL 50 MG PO TABS: 50.0000 mg | ORAL_TABLET | Freq: Four times a day (QID) | ORAL | Status: AC | PRN

## 2012-04-24 MED ORDER — HYDROCODONE-ACETAMINOPHEN 5-325 MG PO TABS
1.0000 | ORAL_TABLET | Freq: Once | ORAL | Status: AC
  Administered 2012-04-24: 1 via ORAL
  Filled 2012-04-24: qty 1

## 2012-04-24 MED ORDER — HYDROCODONE-ACETAMINOPHEN 5-325 MG PO TABS: 2.0000 | ORAL_TABLET | Freq: Once | ORAL | Status: DC

## 2012-04-24 NOTE — ED Provider Notes (Signed)
History     CSN: [id number]  Arrival date & time 04/24/12  1017   First MD Initiated Contact with Patient 04/24/12 1352      Chief Complaint  Patient presents with  . left flank pain     (Consider location/radiation/quality/duration/timing/severity/associated sxs/prior treatment) The history is provided by the patient.  pt c/o left flank pain for past couple days. Acute onset, constant, waxes/wanes intensity. Does not radiate. Denies same pain in past although does have hx intermittent low back pain. No hx ddd. No radicular pain. No hematuria or dysuria. Denies hx kidney stones. No anterior/abd pain. Is having normal bms. Normal appetite. No nv. Denies fever or chills. No back trauma, strain or lifting/bending injury. No hx aaa.      Past Medical History  Diagnosis Date  . Coronary artery disease     status post inferior wall myocardial infarction  . Diabetes mellitus   . Obesity   . Leg edema   . Sleep apnea   . Hypothyroidism   . Dyslipidemia   . Hypotension 08/02/2010    recent episodes of orthostatic hypotension  . Neuropathy of hand     left hand numbness/neuropathy  . Cirrhosis, non-alcoholic     Past Surgical History  Procedure Date  . Cardiac catheterization     The ejection fraction is around 50%  . Coronary stent placement   . Cholecystectomy   . Abdominal hysterectomy   . Tubal ligation     History reviewed. No pertinent family history.  History  Substance Use Topics  . Smoking status: Never Smoker   . Smokeless tobacco: Not on file  . Alcohol Use: No    OB History    Grav Para Term Preterm Abortions TAB SAB Ect Mult Living                  Review of Systems  Constitutional: Negative for fever and chills.  HENT: Negative for neck pain.   Respiratory: Negative for shortness of breath.   Cardiovascular: Negative for chest pain.  Gastrointestinal: Negative for abdominal pain.  Genitourinary: Positive for flank pain. Negative for dysuria and  hematuria.  Musculoskeletal: Positive for back pain.  Skin: Negative for rash.  Neurological: Negative for headaches.  Hematological: Does not bruise/bleed easily.  Psychiatric/Behavioral: Negative for confusion.    Allergies  Ace inhibitors; Penicillins; and Sulfonamide derivatives  Home Medications   Current Outpatient Rx  Name Route Sig Dispense Refill  . ASPIRIN EC 81 MG PO TBEC Oral Take 81 mg by mouth daily.    . ATORVASTATIN CALCIUM 20 MG PO TABS Oral Take 20 mg by mouth daily.     Marland Kitchen BISACODYL 5 MG PO TBEC Oral Take 5 mg by mouth daily as needed.    Marland Kitchen CALCIUM + D PO Oral Take 1 tablet by mouth daily.     Marland Kitchen CARVEDILOL 12.5 MG PO TABS Oral Take 1 tablet (12.5 mg total) by mouth 2 (two) times daily. 60 tablet 6  . ESCITALOPRAM OXALATE 10 MG PO TABS Oral Take 10 mg by mouth daily as needed.     Marland Kitchen NEXIUM PO Oral Take 40 mg by mouth daily.     . FUROSEMIDE 40 MG PO TABS Oral Take 40 mg by mouth daily.     . INSULIN ASPART 100 UNIT/ML [place] SOLN Subcutaneous Inject 40 Units into the skin 3 (three) times daily before meals.     . INSULIN GLARGINE 100 UNIT/ML [place] SOLN Subcutaneous Inject 45-50 Units  into the skin See admin instructions. 50 units in the mornings and 45 units at night    . LEVOTHYROXINE SODIUM 125 MCG PO TABS Oral Take 125 mcg by mouth daily.     Marland Kitchen SINGULAIR PO Oral Take 10 mg by mouth.     . ADULT MULTIVITAMIN W/MINERALS CH Oral Take 1 tablet by mouth daily.    Marland Kitchen NITROGLYCERIN 0.4 MG SL SUBL Sublingual Place 0.4 mg under the tongue every 5 (five) minutes as needed. Chest pain    . PATANASE NA Nasal Place 1 spray into the nose daily. 25    . ONDANSETRON HCL 8 MG PO TABS Oral Take 8 mg by mouth every 8 (eight) hours as needed. nausea    . POTASSIUM CHLORIDE 10 MEQ PO TBCR Oral Take 10 mEq by mouth 2 (two) times daily.     Marland Kitchen RANITIDINE HCL 75 MG PO TABS Oral Take 75 mg by mouth daily.      BP 110/50  Pulse 60  Temp 97.8 F (36.6 C) (Oral)  Resp 16  SpO2 97%  Physical  Exam  Nursing note and vitals reviewed. Constitutional: She is oriented to person, place, and time. She appears well-developed and well-nourished. No distress.  HENT:  Head: Atraumatic.  Eyes: Conjunctivae are normal. No scleral icterus.  Neck: Neck supple. No tracheal deviation present.  Cardiovascular: Normal rate.   Pulmonary/Chest: Effort normal and breath sounds normal. No respiratory distress.  Abdominal: Soft. Normal appearance and bowel sounds are normal. She exhibits no distension and no mass. There is no tenderness. There is no rebound and no guarding.       No puls mass  Genitourinary:       No cva tenderness  Musculoskeletal: She exhibits no edema.       tls spine non tender, aligned, no step off  Neurological: She is alert and oriented to person, place, and time.       Straight leg raise neg. Motor/sens intact.    Skin: Skin is warm and dry. No rash noted.       No shingles/rash in area of pain  Psychiatric: She has a normal mood and affect.    ED Course  Procedures (including critical care time)   Results for orders placed during the hospital encounter of 04/24/12  URINALYSIS, ROUTINE W REFLEX MICROSCOPIC      Component Value Range   Color, Urine YELLOW  YELLOW   APPearance CLEAR  CLEAR   Specific Gravity, Urine 1.007  1.005 - 1.030   pH 7.0  5.0 - 8.0   Glucose, UA NEGATIVE  NEGATIVE mg/dL   Hgb urine dipstick NEGATIVE  NEGATIVE   Bilirubin Urine NEGATIVE  NEGATIVE   Ketones, ur NEGATIVE  NEGATIVE mg/dL   Protein, ur NEGATIVE  NEGATIVE mg/dL   Urobilinogen, UA 0.2  0.0 - 1.0 mg/dL   Nitrite NEGATIVE  NEGATIVE   Leukocytes, UA NEGATIVE  NEGATIVE  CBC WITH DIFFERENTIAL      Component Value Range   WBC 5.6  4.0 - 10.5 K/uL   RBC 5.08  3.87 - 5.11 MIL/uL   Hemoglobin 14.2  12.0 - 15.0 g/dL   HCT 43.6  36.0 - 46.0 %   MCV 85.8  78.0 - 100.0 fL   MCH 28.0  26.0 - 34.0 pg   MCHC 32.6  30.0 - 36.0 g/dL   RDW 15.6 (*) 11.5 - 15.5 %   Platelets 90 (*) 150 -  400 K/uL   Neutrophils  Relative 66  43 - 77 %   Lymphocytes Relative 22  12 - 46 %   Monocytes Relative 9  3 - 12 %   Eosinophils Relative 2  0 - 5 %   Basophils Relative 1  0 - 1 %   Neutro Abs 3.7  1.7 - 7.7 K/uL   Lymphs Abs 1.2  0.7 - 4.0 K/uL   Monocytes Absolute 0.5  0.1 - 1.0 K/uL   Eosinophils Absolute 0.1  0.0 - 0.7 K/uL   Basophils Absolute 0.1  0.0 - 0.1 K/uL  COMPREHENSIVE METABOLIC PANEL      Component Value Range   Sodium 138  135 - 145 mEq/L   Potassium 4.1  3.5 - 5.1 mEq/L   Chloride 102  96 - 112 mEq/L   CO2 27  19 - 32 mEq/L   Glucose, Bld 136 (*) 70 - 99 mg/dL   BUN 11  6 - 23 mg/dL   Creatinine, Ser 0.89  0.50 - 1.10 mg/dL   Calcium 9.1  8.4 - 10.5 mg/dL   Total Protein 7.6  6.0 - 8.3 g/dL   Albumin 3.4 (*) 3.5 - 5.2 g/dL   AST 39 (*) 0 - 37 U/L   ALT 29  0 - 35 U/L   Alkaline Phosphatase 255 (*) 39 - 117 U/L   Total Bilirubin 0.7  0.3 - 1.2 mg/dL   GFR calc non Af Amer 62 (*) >90 mL/min   GFR calc Af Amer 72 (*) >90 mL/min   Ct Abdomen Pelvis Wo Contrast  04/24/2012  *RADIOLOGY REPORT*  Clinical Data: Left flank pain.  Nausea.  Cirrhosis. Diverticulosis.  CT ABDOMEN AND PELVIS WITHOUT CONTRAST  Technique:  Multidetector CT imaging of the abdomen and pelvis was performed following the standard protocol without intravenous contrast.  Comparison: 10/30/2010  Findings: No evidence of renal calculi or hydronephrosis.   Small right renal cyst remains stable.  No evidence of ureteral calculi or dilatation.  No bladder calculi identified.  Prior hysterectomy noted.  Adnexa are unremarkable in appearance.  Diffuse colonic diverticulosis again demonstrated.  No evidence of diverticulitis.  No other inflammatory process or abnormal fluid collections are identified.  Hepatic cirrhosis is again demonstrated.  No liver mass identified on this noncontrast study.  Borderline splenomegaly remains stable. No evidence of ascites.  The pancreas and adrenal glands are normal in  appearance on this noncontrast study.  IMPRESSION:  1.  No evidence of urolithiasis, hydronephrosis, or other acute findings. 2.  Stable hepatic cirrhosis and borderline splenomegaly. 3.  Diverticulosis.  No radiographic evidence of diverticulitis.   Original Report Authenticated By: Marlaine Hind, M.D.       MDM  Labs. vicodin po.   Recheck pt comfortable. Discussed ct results.  Given neg acute ct, suspect musculoskeletal back pain.        Mirna Mires, MD 04/24/12 [phone]

## 2012-04-24 NOTE — ED Notes (Signed)
Pt verbalizes understanding 

## 2012-04-24 NOTE — ED Notes (Signed)
Bed:WA12<BR> Expected date:<BR> Expected time:<BR> Means of arrival:<BR> Comments:<BR> triage

## 2012-04-24 NOTE — ED Notes (Signed)
Has not been wearing C-Pap at night like ordered , feeling tired, sleepy in afternoon.

## 2012-04-24 NOTE — ED Notes (Addendum)
Left flank pain started couple days ago-- hurts to move, has had pain intermittently for 2 months, this is different. Has not felt good for about a week.

## 2012-06-23 ENCOUNTER — Ambulatory Visit (INDEPENDENT_AMBULATORY_CARE_PROVIDER_SITE_OTHER): Payer: Medicare Other | Admitting: Cardiovascular Disease

## 2012-06-23 ENCOUNTER — Encounter: Payer: Self-pay | Admitting: Cardiovascular Disease

## 2012-06-23 VITALS — BP 100/58 | HR 69 | Ht 61.5 in | Wt 193.8 lb

## 2012-06-23 DIAGNOSIS — I2510 Atherosclerotic heart disease of native coronary artery without angina pectoris: Secondary | ICD-10-CM

## 2012-06-23 DIAGNOSIS — I5032 Chronic diastolic (congestive) heart failure: Secondary | ICD-10-CM

## 2012-06-23 NOTE — Progress Notes (Signed)
[name] Date of Birth  11-23-1933 [facility] [address]    [place], [place]  [address] [phone]  Fax  [phone]  Problem list: 1. Coronary artery disease-status post inferior wall myocardial infarction 2. Diabetes mellitus 3. Obesity 4. Sleep apnea 5. Hypothyroidism 6 hyper lipidemia   History of Present Illness:  78 year old female with a history of coronary artery disease. She has a history of an old inferior wall myocardial infarction. She also has a history of diabetes mellitus and hypothyroidism.   She had a stress Myoview study Oct. 31, 2012  It reveals a large inferior lateral scar with only a small to moderate amount of redistribution and apical and lateral walls.. She has a known tight stenosis of a very small ramus intermediate branch that we are treating medically. I suspect this is responsible for this small amount of apical lateral redistribution.  The amount he seen on Myoview study correlates exactly with what her previous past 5 cardiac catheterizations have shown.  She has done well from a cardiac stanpoint.  Denies any chest pain or dyspnea.   She's not had any episodes of chest pain. She has been exercising on occasion.  She was having some minor bleeding and her medical doctor stopped her ASA.  She is going to have an EGD on Nov. 21.  She would like to go back on the ASA ( as would I)  Current Outpatient Prescriptions on File Prior to Visit  Medication Sig Dispense Refill  . aspirin EC 81 MG tablet Take 81 mg by mouth daily.      Marland Kitchen atorvastatin (LIPITOR) 20 MG tablet Take 20 mg by mouth daily.       . bisacodyl (DULCOLAX) 5 MG EC tablet Take 5 mg by mouth daily as needed.       . Calcium Carbonate-Vitamin D (CALCIUM + D PO) Take 1 tablet by mouth daily.       . carvedilol (COREG) 12.5 MG tablet Take 1 tablet (12.5 mg total) by mouth 2 (two) times daily.  60 tablet  6  . escitalopram (LEXAPRO) 10 MG tablet Take 10 mg by mouth daily  as needed.       . Esomeprazole Magnesium (NEXIUM PO) Take 40 mg by mouth daily.       . furosemide (LASIX) 40 MG tablet Take 40 mg by mouth daily.       . insulin aspart (NOVOLOG) 100 UNIT/ML injection Inject 40 Units into the skin 3 (three) times daily before meals.       . insulin glargine (LANTUS) 100 UNIT/ML injection Inject 45-50 Units into the skin See admin instructions. 50 units in the mornings and 45 units at night      . levothyroxine (SYNTHROID, LEVOTHROID) 125 MCG tablet Take 125 mcg by mouth daily.       . Montelukast Sodium (SINGULAIR PO) Take 10 mg by mouth.       . Multiple Vitamin (MULTIVITAMIN WITH MINERALS) TABS Take 1 tablet by mouth daily.      . nitroGLYCERIN (NITROSTAT) 0.4 MG SL tablet Place 0.4 mg under the tongue every 5 (five) minutes as needed. Chest pain      . Olopatadine HCl (PATANASE NA) Place 1 spray into the nose daily. 25      . ondansetron (ZOFRAN) 8 MG tablet Take 8 mg by mouth every 8 (eight) hours as needed. nausea      . potassium chloride (KLOR-CON) 10 MEQ CR tablet  Take 10 mEq by mouth 2 (two) times daily.       . ranitidine (ZANTAC) 75 MG tablet Take 75 mg by mouth daily.        Allergies  Allergen Reactions  . Ace Inhibitors Cough  . Penicillins Hives and Swelling  . Sulfonamide Derivatives Swelling    Past Medical History  Diagnosis Date  . Coronary artery disease     status post inferior wall myocardial infarction  . Diabetes mellitus   . Obesity   . Leg edema   . Sleep apnea   . Hypothyroidism   . Dyslipidemia   . Hypotension 08/02/2010    recent episodes of orthostatic hypotension  . Neuropathy of hand     left hand numbness/neuropathy  . Cirrhosis, non-alcoholic     Past Surgical History  Procedure Date  . Cardiac catheterization     The ejection fraction is around 50%  . Coronary stent placement   . Cholecystectomy   . Abdominal hysterectomy   . Tubal ligation     History  Smoking status  . Never Smoker   Smokeless  tobacco  . Not on file    History  Alcohol Use No    No family history on file.  Reviw of Systems:  Reviewed in the HPI.  All other systems are negative.  Physical Exam: BP 100/58  Pulse 69  Ht 5' 1.5" (1.562 m)  Wt 193 lb 12.8 oz (87.907 kg)  BMI 36.03 kg/m2  SpO2 94% The patient is alert and oriented x 3.  The mood and affect are normal.   Skin: warm and dry.  Color is normal.    HEENT:   Normocephalic, atraumatic. She is no JVD. Her carotids are normal.  Lungs: Clear  Heart: Regular rate S1-S2. There is a soft systolic murmur    Abdomen: Moderately obese. She has good bowel sounds.  Extremities:  No clubbing cyanosis or edema  Neuro:  Nonfocal. Gait is normal.     ECG:06/23/2012-normal sinus rhythm at 65 beats a minute. Is a previous inferior wall myocardial infarction. EKG is unchanged from previous tracings.  Assessment / Plan:

## 2012-06-23 NOTE — Assessment & Plan Note (Signed)
Stable

## 2012-06-23 NOTE — Assessment & Plan Note (Signed)
[name] is doing well.  She has not had any problems recently.  Will continue with her same medications.  I have encouraged her to exercise more in an effort to lose weight.

## 2012-06-23 NOTE — Patient Instructions (Addendum)
Your physician wants you to follow-up in: 6 months  You will receive a reminder letter in the mail two months in advance. If you don't receive a letter, please call our office to schedule the follow-up appointment.  Your physician recommends that you return for a FASTING lipid profile: 6 month

## 2012-07-16 ENCOUNTER — Other Ambulatory Visit: Payer: Self-pay | Admitting: Cardiovascular Disease

## 2012-07-16 MED ORDER — CARVEDILOL 12.5 MG PO TABS: 12.5000 mg | ORAL_TABLET | Freq: Two times a day (BID) | ORAL | Status: DC

## 2012-10-08 ENCOUNTER — Encounter: Payer: Self-pay | Admitting: Internal Medicine

## 2012-10-08 ENCOUNTER — Ambulatory Visit (INDEPENDENT_AMBULATORY_CARE_PROVIDER_SITE_OTHER): Payer: Medicare Other | Admitting: Internal Medicine

## 2012-10-08 VITALS — BP 102/62 | HR 58 | Temp 97.6°F | Ht 61.0 in | Wt 200.8 lb

## 2012-10-08 DIAGNOSIS — R942 Abnormal results of pulmonary function studies: Secondary | ICD-10-CM

## 2012-10-08 DIAGNOSIS — R0609 Other forms of dyspnea: Secondary | ICD-10-CM

## 2012-10-08 DIAGNOSIS — R0689 Other abnormalities of breathing: Secondary | ICD-10-CM

## 2012-10-08 DIAGNOSIS — R0989 Other specified symptoms and signs involving the circulatory and respiratory systems: Secondary | ICD-10-CM

## 2012-10-08 DIAGNOSIS — R0600 Dyspnea, unspecified: Secondary | ICD-10-CM

## 2012-10-08 NOTE — Progress Notes (Signed)
Subjective:    Patient ID: [name], female    DOB: Sep 10, 1933, 79 y.o.   MRN: [id number]  HPI .    IOV 10/08/2012 New consult of her dyspnea on exertion.   - 79 year old obese, body mass index 38, never smoker with a history of passive smoking. - Reports insidious onset of dyspnea on exertion for the last 3-4 years. Gradually progressive but for the last 4 weeks increasingly progressive. Severity is rated as moderate. Worsened with exertion and relieved by rest. Brought on by exertional activities such as vacuuming the house and climbing steps. Worsening dyspnea is associated only with a 2 pound weight gain in the last year. There is no associated wheezing except a few episodes some month ago or some weeks ago. There is no associated chest pain, orthopnea, paroxysmal nocturnal dyspnea, edema, hemoptysis.  - Pulmonary workup  - Get pulmonary function testing 04/06/2008 that I personally reviewed. FVC is 1.8 L 79%, FEV1 is 1.5 L 80% and a ratio of 82 which is 100%. There is no bronchodilator response. Total lung capacity of 3 L/77%. No diffusion was done. Results were consistent with restriction that was consistent with obesity. She saw a pulmonologist at that time  and was noted that she did not have any response with Advair such was asked to stop that. His advice was weight loss   - She had an abdominal CT with lung cut in 2013: Showed lung fields that were clear   - Cardiac workup  - She had MI in the 1990s following chest pain. She status post cardiac stent  - She sees Dr. Acie Fredrickson for the same: Per his records Myoview in 2012 showed large inferior wall infarct with associated hypokinesis. Review of the record shows that in 2011 her left ventricular ejection fraction was 47% but in 2012 July resting cardiac echo showed ejection fraction of 56% but that was severe left-sided diastolic dysfunction   Walking desaturation test on 10/08/2012 185 feet x 3 laps:  did not desaturate. Rest pulse  ox was 97%, final pulse ox was 94%. HR response 62/min at rest to 81/min at peak exertion.  She is not willing to have pulmonary stress test on bike due to difficulty of her obesity and djd and dyspnea     Review of Systems  Constitutional: Negative for fever and unexpected weight change.  HENT: Positive for congestion, rhinorrhea, sneezing and postnasal drip. Negative for ear pain, nosebleeds, sore throat, trouble swallowing, dental problem and sinus pressure.   Eyes: Negative for redness and itching.  Respiratory: Positive for shortness of breath. Negative for cough, chest tightness and wheezing.   Cardiovascular: Negative for palpitations and leg swelling.  Gastrointestinal: Negative for nausea and vomiting.  Genitourinary: Negative for dysuria.  Musculoskeletal: Negative for joint swelling.  Skin: Negative for rash.  Neurological: Positive for headaches.  Hematological: Does not bruise/bleed easily.  Psychiatric/Behavioral: Negative for dysphoric mood. The patient is not nervous/anxious.        Objective:   Physical Exam  Vitals reviewed. Constitutional: She is oriented to person, place, and time. She appears well-developed and well-nourished. No distress.  HENT:  Head: Normocephalic and atraumatic.  Right Ear: External ear normal.  Left Ear: External ear normal.  Mouth/Throat: Oropharynx is clear and moist. No oropharyngeal exudate.  Eyes: Conjunctivae and EOM are normal. Pupils are equal, round, and reactive to light. Right eye exhibits no discharge. Left eye exhibits no discharge. No scleral icterus.  Neck: Normal range of motion. Neck  supple. No JVD present. No tracheal deviation present. No thyromegaly present.  Cardiovascular: Normal rate, regular rhythm, normal heart sounds and intact distal pulses.  Exam reveals no gallop and no friction rub.   No murmur heard. Pulmonary/Chest: Effort normal. No respiratory distress. She has no wheezes. She has rales. She exhibits no  tenderness.  Crackles noted at base  Abdominal: Soft. Bowel sounds are normal. She exhibits no distension and no mass. There is no tenderness. There is no rebound and no guarding.  Musculoskeletal: Normal range of motion. She exhibits no edema and no tenderness.  Lymphadenopathy:    She has no cervical adenopathy.  Neurological: She is alert and oriented to person, place, and time. She has normal reflexes. No cranial nerve deficit. She exhibits normal muscle tone. Coordination normal.  Skin: Skin is warm and dry. No rash noted. She is not diaphoretic. No erythema. No pallor.  Psychiatric: She has a normal mood and affect. Her behavior is normal. Judgment and thought content normal.        Assessment & Plan:

## 2012-10-08 NOTE — Patient Instructions (Addendum)
Based on the evidence of followup I suspect that your shortness of breath is related to weight and also do to the fact that her heart muscle is not relaxing properly when you exert yourself. This condition is called diastolic dysfunction. But before we prove that we first need to make sure there is no lung disease  - Have breathing test called pulmonary function test  - Have CT scan of the chest to look for scarring disease in the lungs called pulmonary fibrosis Return to see me after above

## 2012-10-09 NOTE — Assessment & Plan Note (Signed)
Based on the evidence of followup I suspect that your shortness of breath is related to weight and also do to the fact that her heart muscle is not relaxing properly when you exert yourself. This condition is called diastolic dysfunction. But before we prove that we first need to make sure there is no lung disease  - Have breathing test called pulmonary function test  - Have CT scan of the chest to look for scarring disease in the lungs called pulmonary fibrosis Return to see me after above

## 2012-10-13 ENCOUNTER — Ambulatory Visit (INDEPENDENT_AMBULATORY_CARE_PROVIDER_SITE_OTHER)
Admission: RE | Admit: 2012-10-13 | Discharge: 2012-10-13 | Disposition: A | Discharge status: Home / Self Care | Payer: Medicare Other | Source: Ambulatory Visit | Attending: Internal Medicine | Admitting: Internal Medicine

## 2012-10-13 ENCOUNTER — Telehealth: Payer: Self-pay | Admitting: Internal Medicine

## 2012-10-13 DIAGNOSIS — R0989 Other specified symptoms and signs involving the circulatory and respiratory systems: Secondary | ICD-10-CM

## 2012-10-13 DIAGNOSIS — R0689 Other abnormalities of breathing: Secondary | ICD-10-CM

## 2012-10-13 DIAGNOSIS — J849 Interstitial pulmonary disease, unspecified: Secondary | ICD-10-CM

## 2012-10-13 DIAGNOSIS — R0609 Other forms of dyspnea: Secondary | ICD-10-CM

## 2012-10-13 DIAGNOSIS — R942 Abnormal results of pulmonary function studies: Secondary | ICD-10-CM

## 2012-10-13 DIAGNOSIS — R0600 Dyspnea, unspecified: Secondary | ICD-10-CM

## 2012-10-13 NOTE — Telephone Encounter (Signed)
CT chest shows possible early lung fibrosis (scarring). This might also account for shortness of breath. Please have her do autoimmune blood test before she sees me (to look for lung inflmmatory disorders). Aldo do ONO  All ordered

## 2012-10-14 NOTE — Telephone Encounter (Signed)
[id number] on cell number, home number was busy. Carron Curie, CMA

## 2012-10-14 NOTE — Telephone Encounter (Signed)
Pt returned call.  She can be reached @ [phone]. Dion Saucier

## 2012-10-15 NOTE — Telephone Encounter (Signed)
I spoke with the pt and advised of recs. Pt will get labs done on 3-3 with she comes in for PFT. ONO order placed.Carron Curie, CMA

## 2012-10-20 ENCOUNTER — Ambulatory Visit (INDEPENDENT_AMBULATORY_CARE_PROVIDER_SITE_OTHER): Payer: Medicare Other | Admitting: Internal Medicine

## 2012-10-20 DIAGNOSIS — R0689 Other abnormalities of breathing: Secondary | ICD-10-CM

## 2012-10-20 DIAGNOSIS — R0609 Other forms of dyspnea: Secondary | ICD-10-CM

## 2012-10-20 DIAGNOSIS — R942 Abnormal results of pulmonary function studies: Secondary | ICD-10-CM

## 2012-10-20 DIAGNOSIS — R0989 Other specified symptoms and signs involving the circulatory and respiratory systems: Secondary | ICD-10-CM

## 2012-10-20 DIAGNOSIS — R0600 Dyspnea, unspecified: Secondary | ICD-10-CM

## 2012-10-20 LAB — PULMONARY FUNCTION TEST

## 2012-10-20 NOTE — Progress Notes (Signed)
PFT done today. 

## 2012-10-23 ENCOUNTER — Telehealth: Payer: Self-pay | Admitting: Internal Medicine

## 2012-10-23 NOTE — Telephone Encounter (Signed)
Pulmonary function test done 10/20/2012 is consistent with  restriction and low diffusion capacity. Total lung capacity is 78% at 2.4 L. Diffusion is 10.0/40%. Spirometry is 1.3 L/70% FEV1. FVC is 1.8 L/72%. FEV1 FVC ratio 74 against a predicted of 71. Small airway is lowered at 1.0/50%  I will discuss this with her when I see her 11/25/2012

## 2012-10-24 ENCOUNTER — Ambulatory Visit: Payer: Medicare Other | Admitting: Internal Medicine

## 2012-11-02 ENCOUNTER — Telehealth: Payer: Self-pay | Admitting: Internal Medicine

## 2012-11-02 DIAGNOSIS — R0902 Hypoxemia: Secondary | ICD-10-CM

## 2012-11-02 NOTE — Telephone Encounter (Signed)
ono on 10/22/12 shows she spent 16 min 56 sec at pulse ox < 88%. This is3% of sleep time. Shequalifies for o2 2L [place] at night. She currently has [employer]. So change [facility] to [facility]. I have done order. Pleae follow through with [facility] and ensure switch  Dr. Brand Males, M.D., [facility].C.P Pulmonary and Critical Care Medicine Staff Physician [facility] Pager: [phone], If no answer or between  15:00h - 7:00h: call [phone]  11/02/2012 10:13 PM

## 2012-11-04 NOTE — Telephone Encounter (Signed)
Left pt a message that order for 02 has been faxed to [facility]

## 2012-11-04 NOTE — Telephone Encounter (Signed)
Pt is returning [name]'s call & can be reached at [phone].  [name]

## 2012-11-13 ENCOUNTER — Encounter: Payer: Self-pay | Admitting: Internal Medicine

## 2012-11-25 ENCOUNTER — Ambulatory Visit (INDEPENDENT_AMBULATORY_CARE_PROVIDER_SITE_OTHER): Payer: Medicare Other | Admitting: Internal Medicine

## 2012-11-25 ENCOUNTER — Other Ambulatory Visit (INDEPENDENT_AMBULATORY_CARE_PROVIDER_SITE_OTHER): Payer: Medicare Other

## 2012-11-25 ENCOUNTER — Encounter: Payer: Self-pay | Admitting: Internal Medicine

## 2012-11-25 VITALS — BP 120/54 | HR 64 | Temp 98.0°F | Ht 61.0 in | Wt 196.0 lb

## 2012-11-25 DIAGNOSIS — J8410 Pulmonary fibrosis, unspecified: Secondary | ICD-10-CM

## 2012-11-25 DIAGNOSIS — J849 Interstitial pulmonary disease, unspecified: Secondary | ICD-10-CM

## 2012-11-25 DIAGNOSIS — R0989 Other specified symptoms and signs involving the circulatory and respiratory systems: Secondary | ICD-10-CM

## 2012-11-25 DIAGNOSIS — I5020 Unspecified systolic (congestive) heart failure: Secondary | ICD-10-CM

## 2012-11-25 DIAGNOSIS — R0609 Other forms of dyspnea: Secondary | ICD-10-CM

## 2012-11-25 LAB — CK: Total CK: 71 U/L (ref 7–177)

## 2012-11-25 LAB — BRAIN NATRIURETIC PEPTIDE: Pro B Natriuretic peptide (BNP): 90 pg/mL (ref 0.0–100.0)

## 2012-11-25 NOTE — Patient Instructions (Addendum)
Please do blood work for evaluation of interstitial lung disease causes I will call you with the blood work results If the blood work results are negative then I will refer you to a thoracic surgeon for lung biopsy Please meet with coordinator. Portable oxygen system set up Cheaper alternative for Nexium is  - [facility] Prilosec 20 mg once daily  - [facility] ranitidine 300 mg once a day at night Followup  - Based on blood test results and phone call

## 2012-11-25 NOTE — Progress Notes (Signed)
Subjective:    Patient ID: [name], female    DOB: 1934-06-07, 79 y.o.   MRN: [id number]  HPI  IOV 10/08/2012 New consult of her dyspnea on exertion.   - 79 year old obese, body mass index 38, never smoker with a history of passive smoking. - Reports insidious onset of dyspnea on exertion for the last 3-4 years. Gradually progressive but for the last 4 weeks increasingly progressive. Severity is rated as moderate. Worsened with exertion and relieved by rest. Brought on by exertional activities such as vacuuming the house and climbing steps. Worsening dyspnea is associated only with a 2 pound weight gain in the last year. There is no associated wheezing except a few episodes some month ago or some weeks ago. There is no associated chest pain, orthopnea, paroxysmal nocturnal dyspnea, edema, hemoptysis.  - Pulmonary workup  - Get pulmonary function testing 04/06/2008 that I personally reviewed. FVC is 1.8 L 79%, FEV1 is 1.5 L 80% and a ratio of 82 which is 100%. There is no bronchodilator response. Total lung capacity of 3 L/77%. No diffusion was done. Results were consistent with restriction. She saw a pulmonologist at that time  and was noted that she did not have any response with Advair such was asked to stop that. His advice was weight loss   - She had an abdominal CT with lung cut in 2013: Showed lung fields that were clear   - Cardiac workup  - She had MI in the 1990s following chest pain. She status post cardiac stent  - She sees Dr. Acie Fredrickson for the same: Per his records Myoview in 2012 showed large inferior wall infarct with associated hypokinesis. Review of the record shows that in 2011 her left ventricular ejection fraction was 47% but in 2012 July resting cardiac echo showed ejection fraction of 56% but that was severe left-sided diastolic dysfunction   Walking desaturation test on 10/08/2012 185 feet x 3 laps:  did not desaturate. Rest pulse ox was 97%, final pulse ox was 94%. HR  response 62/min at rest to 81/min at peak exertion.  She is not willing to have pulmonary stress test on bike due to difficulty of her obesity and djd and dyspnea   Telephone calls Feb  And March 2014 -= CT chest 10/13/12:  Mild chronic interstitial lung disease consisting of subpleural  reticulation and mild subpleural banding. At this time there is no  honeycombing or evidence of bronchiectasis. Cannot rule out early  Usual Interstitial Pneumonitis.  3. Borderline enlarged mediastinal lymph nodes are nonspecific.  However, in the setting of interstitial lung disease these may be  reactive in et Report by Marya Amsler on 10/22/12 shows she spent 16 min 56 sec at pulse ox < 88%. This is3% of sleep time. Shequalifies for o2 2L [place] at night.    Pulmonary function test done 10/20/2012 is consistent with  restriction and low diffusion capacity. Total lung capacity is 78% at 2.4 L. Diffusion is 10.0/40%. Spirometry is 1.3 L/70% FEV1. FVC is 1.8 L/72%. FEV1 FVC ratio 74 against a predicted of 71. Small airway is lowered at 1.0/50%     OV 11/25/2012     She is here with her daughter to discuss results for dyspnea workup. Test results are as above. She has interstitial lung disease not otherwise specified. CT scan is not clear cut IPF pattern but is suggestive. She did not do her autoimmune blood work due to [employer]. There no new issues dyspnea  remains the same. She is open to lung biopsies autoimmune lab tests are negative.   Past, Family, Social reviewed: no change since last visit  Review of Systems  Constitutional: Negative for fever and unexpected weight change.  HENT: Negative for ear pain, nosebleeds, congestion, sore throat, rhinorrhea, sneezing, trouble swallowing, dental problem, postnasal drip and sinus pressure.   Eyes: Positive for visual disturbance. Negative for redness and itching.  Respiratory: Positive for chest tightness and shortness of breath. Negative for cough and  wheezing.   Cardiovascular: Positive for leg swelling. Negative for palpitations.  Gastrointestinal: Negative for nausea and vomiting.  Genitourinary: Negative for dysuria.  Musculoskeletal: Negative for joint swelling.  Skin: Negative for rash.  Allergic/Immunologic: Positive for environmental allergies.  Neurological: Negative for headaches.  Hematological: Does not bruise/bleed easily.  Psychiatric/Behavioral: Negative for dysphoric mood. The patient is not nervous/anxious.        Objective:   Physical Exam  Vitals reviewed. Constitutional: She is oriented to person, place, and time. She appears well-developed and well-nourished. No distress.  HENT:  Head: Normocephalic and atraumatic.  Right Ear: External ear normal.  Left Ear: External ear normal.  Mouth/Throat: Oropharynx is clear and moist. No oropharyngeal exudate.  Eyes: Conjunctivae and EOM are normal. Pupils are equal, round, and reactive to light. Right eye exhibits no discharge. Left eye exhibits no discharge. No scleral icterus.  Neck: Normal range of motion. Neck supple. No JVD present. No tracheal deviation present. No thyromegaly present.  Cardiovascular: Normal rate, regular rhythm, normal heart sounds and intact distal pulses.  Exam reveals no gallop and no friction rub.   No murmur heard. Pulmonary/Chest: Effort normal. No respiratory distress. She has no wheezes. She has rales. She exhibits no tenderness.  Crackles noted at base  Abdominal: Soft. Bowel sounds are normal. She exhibits no distension and no mass. There is no tenderness. There is no rebound and no guarding.  Musculoskeletal: Normal range of motion. She exhibits no edema and no tenderness.  Lymphadenopathy:    She has no cervical adenopathy.  Neurological: She is alert and oriented to person, place, and time. She has normal reflexes. No cranial nerve deficit. She exhibits normal muscle tone. Coordination normal.  Skin: Skin is warm and dry. No rash  noted. She is not diaphoretic. No erythema. No pallor.  Psychiatric: She has a normal mood and affect. Her behavior is normal. Judgment and thought content normal.         Assessment & Plan:

## 2012-11-25 NOTE — Assessment & Plan Note (Signed)
Discussed disease 1. Gave diagnosis of interstitial lung disease not otherwise specified. Explained this is really an umbrella term the does not mean anything specific in terms of etiology, prognosis, treatment options without establishing a specific diagnosis 2. Explained varied etiology and varied differential diagnosis such as autoimmune lung disease [ruled out by blood test] and other differential diagnoses that includes diseases that most patients have never heard of. These are NSIP, chronic eosinophilic pneumonitis, hypersensitivity pneumonitis, sarcoidosis, idiopathic pulmonary fibrosis (IPF) which has a genetic component, BOOP/COP, and that smokers specific diagnosis such as RB-ILD, DIP. These are only a few of the many. Of these, explained IPF is the most severe disease which is progressive and fatal with a median survival of 3-7 years. 3. Explained etiologic workup involves autoimmune panel  4. Explained if  autoimmune panel is negative patient might need biopsy [Gen. process of video-assisted thoracoscopic lung biopsy with the yield of greater than 90% and risk of general anesthesia, pain, hospitalization for a few days, bronchopleural fistula, local site infection, post biopsy neuralgia and rarely death and risk for pulmonary embolism explained]  6. explained specific treatment depends on specific diagnosis.  IPF has a new treatment called Pirfenidone which delays progression  Based on all this they want to proceed with lung biopsy which I have highly recommended if autoimmune panel is negative   > 50% of this > 25 min visit spent in face to face counseling (15 min visit converted to 25 min)

## 2012-11-26 ENCOUNTER — Telehealth: Payer: Self-pay | Admitting: Internal Medicine

## 2012-11-26 DIAGNOSIS — J849 Interstitial pulmonary disease, unspecified: Secondary | ICD-10-CM

## 2012-11-26 LAB — ANCA SCREEN W REFLEX TITER
Atypical p-ANCA Screen: NEGATIVE
c-ANCA Screen: NEGATIVE
p-ANCA Screen: NEGATIVE

## 2012-11-26 LAB — SJOGRENS SYNDROME-A EXTRACTABLE NUCLEAR ANTIBODY: SSA (Ro) (ENA) Antibody, IgG: 1 AU/mL (ref <30)

## 2012-11-26 LAB — SJOGRENS SYNDROME-B EXTRACTABLE NUCLEAR ANTIBODY: SSB (La) (ENA) Antibody, IgG: <1 AU/mL (ref <30)

## 2012-11-26 LAB — ANTI-SCLERODERMA ANTIBODY: Scleroderma (Scl-70) (ENA) Antibody, IgG: <1 AU/mL (ref <30)

## 2012-11-26 LAB — ANGIOTENSIN CONVERTING ENZYME: Angiotensin-Converting Enzyme: 65 U/L — ABNORMAL HIGH (ref 8–52)

## 2012-11-26 LAB — RHEUMATOID FACTOR: Rhuematoid fact SerPl-aCnc: 19 IU/mL — ABNORMAL HIGH (ref <=14)

## 2012-11-26 LAB — ANA: Anti Nuclear Antibody(ANA): NEGATIVE

## 2012-11-26 LAB — CYCLIC CITRUL PEPTIDE ANTIBODY, IGG: Cyclic Citrullin Peptide Ab: <2 U/mL (ref 0.0–5.0)

## 2012-11-26 LAB — SEDIMENTATION RATE: Sed Rate: 33 mm/hr — ABNORMAL HIGH (ref 0–22)

## 2012-11-26 NOTE — Telephone Encounter (Signed)
Please let her know autoimmune blood test is noncontributory to the cause of her interstitial lung disease. As discussed yesterday 11/25/2012 she needs to proceed with thoracic surgery consultation for lung biopsy. I have made an order. Please facilitate  Please give followup for followup after lung biopsy  Dr. Brand Males, M.D., [facility].C.P Pulmonary and Critical Care Medicine Staff Physician [place] [facility] Pager: [phone], If no answer or between  15:00h - 7:00h: call [phone]  11/26/2012 2:02 PM

## 2012-12-02 LAB — HYPERSENSITIVITY PNUEMONITIS PROFILE

## 2012-12-03 ENCOUNTER — Telehealth: Payer: Self-pay | Admitting: Internal Medicine

## 2012-12-03 NOTE — Telephone Encounter (Signed)
Pt aware of results.Records sent to [facility] and we are awaiting an appointment. Pt is aware she will need f/u here after biopsy. Carron Curie, CMA

## 2012-12-03 NOTE — Telephone Encounter (Signed)
Pt scheduled to see Dr. Zenaida Niece Trigt 4/21/4 at 300pm Will forward to MR as [facility]

## 2012-12-08 ENCOUNTER — Encounter: Payer: Medicare Other | Admitting: Cardiothoracic Surgery

## 2012-12-08 NOTE — Telephone Encounter (Signed)
Noted  Dr. Brand Males, M.D., [facility].C.P Pulmonary and Critical Care Medicine Staff Physician [place] [facility] Pager: [phone], If no answer or between  15:00h - 7:00h: call [phone]  12/08/2012 12:41 PM

## 2012-12-11 ENCOUNTER — Inpatient Hospital Stay (HOSPITAL_COMMUNITY)
Admission: AD | Admit: 2012-12-11 | Discharge: 2012-12-16 | DRG: 378 | Disposition: A | Discharge status: Home / Self Care | Payer: Medicare Other | Attending: Internal Medicine | Admitting: Internal Medicine

## 2012-12-11 ENCOUNTER — Encounter (HOSPITAL_COMMUNITY): Payer: Self-pay | Admitting: Emergency Medicine

## 2012-12-11 ENCOUNTER — Emergency Department (HOSPITAL_COMMUNITY): Payer: Medicare Other

## 2012-12-11 DIAGNOSIS — R062 Wheezing: Secondary | ICD-10-CM | POA: Diagnosis present

## 2012-12-11 DIAGNOSIS — D638 Anemia in other chronic diseases classified elsewhere: Secondary | ICD-10-CM | POA: Diagnosis present

## 2012-12-11 DIAGNOSIS — I951 Orthostatic hypotension: Secondary | ICD-10-CM | POA: Diagnosis present

## 2012-12-11 DIAGNOSIS — R42 Dizziness and giddiness: Secondary | ICD-10-CM

## 2012-12-11 DIAGNOSIS — D72829 Elevated white blood cell count, unspecified: Secondary | ICD-10-CM | POA: Diagnosis present

## 2012-12-11 DIAGNOSIS — E876 Hypokalemia: Secondary | ICD-10-CM | POA: Diagnosis not present

## 2012-12-11 DIAGNOSIS — I959 Hypotension, unspecified: Secondary | ICD-10-CM | POA: Diagnosis not present

## 2012-12-11 DIAGNOSIS — I252 Old myocardial infarction: Secondary | ICD-10-CM

## 2012-12-11 DIAGNOSIS — J449 Chronic obstructive pulmonary disease, unspecified: Secondary | ICD-10-CM | POA: Diagnosis present

## 2012-12-11 DIAGNOSIS — K3184 Gastroparesis: Secondary | ICD-10-CM | POA: Diagnosis present

## 2012-12-11 DIAGNOSIS — Z8719 Personal history of other diseases of the digestive system: Secondary | ICD-10-CM | POA: Diagnosis present

## 2012-12-11 DIAGNOSIS — K920 Hematemesis: Secondary | ICD-10-CM | POA: Diagnosis present

## 2012-12-11 DIAGNOSIS — R072 Precordial pain: Secondary | ICD-10-CM | POA: Diagnosis present

## 2012-12-11 DIAGNOSIS — G4730 Sleep apnea, unspecified: Secondary | ICD-10-CM | POA: Diagnosis present

## 2012-12-11 DIAGNOSIS — I5042 Chronic combined systolic (congestive) and diastolic (congestive) heart failure: Secondary | ICD-10-CM | POA: Diagnosis present

## 2012-12-11 DIAGNOSIS — I509 Heart failure, unspecified: Secondary | ICD-10-CM | POA: Diagnosis present

## 2012-12-11 DIAGNOSIS — I4721 Torsades de pointes: Secondary | ICD-10-CM | POA: Diagnosis not present

## 2012-12-11 DIAGNOSIS — I5032 Chronic diastolic (congestive) heart failure: Secondary | ICD-10-CM | POA: Diagnosis present

## 2012-12-11 DIAGNOSIS — I2510 Atherosclerotic heart disease of native coronary artery without angina pectoris: Secondary | ICD-10-CM

## 2012-12-11 DIAGNOSIS — E1149 Type 2 diabetes mellitus with other diabetic neurological complication: Secondary | ICD-10-CM | POA: Diagnosis present

## 2012-12-11 DIAGNOSIS — D696 Thrombocytopenia, unspecified: Secondary | ICD-10-CM | POA: Clinically undetermined

## 2012-12-11 DIAGNOSIS — K921 Melena: Principal | ICD-10-CM | POA: Diagnosis present

## 2012-12-11 DIAGNOSIS — A0472 Enterocolitis due to Clostridium difficile, not specified as recurrent: Secondary | ICD-10-CM | POA: Diagnosis present

## 2012-12-11 DIAGNOSIS — Z9861 Coronary angioplasty status: Secondary | ICD-10-CM

## 2012-12-11 DIAGNOSIS — R079 Chest pain, unspecified: Secondary | ICD-10-CM | POA: Diagnosis present

## 2012-12-11 DIAGNOSIS — J4489 Other specified chronic obstructive pulmonary disease: Secondary | ICD-10-CM | POA: Diagnosis present

## 2012-12-11 DIAGNOSIS — K922 Gastrointestinal hemorrhage, unspecified: Secondary | ICD-10-CM

## 2012-12-11 DIAGNOSIS — I472 Ventricular tachycardia: Secondary | ICD-10-CM | POA: Diagnosis not present

## 2012-12-11 DIAGNOSIS — K529 Noninfective gastroenteritis and colitis, unspecified: Secondary | ICD-10-CM

## 2012-12-11 DIAGNOSIS — D62 Acute posthemorrhagic anemia: Secondary | ICD-10-CM | POA: Diagnosis present

## 2012-12-11 DIAGNOSIS — E1142 Type 2 diabetes mellitus with diabetic polyneuropathy: Secondary | ICD-10-CM | POA: Diagnosis present

## 2012-12-11 DIAGNOSIS — E039 Hypothyroidism, unspecified: Secondary | ICD-10-CM | POA: Diagnosis present

## 2012-12-11 DIAGNOSIS — Z794 Long term (current) use of insulin: Secondary | ICD-10-CM

## 2012-12-11 LAB — COMPREHENSIVE METABOLIC PANEL
ALT: 26 U/L (ref 0–35)
AST: 27 U/L (ref 0–37)
Albumin: 2.6 g/dL — ABNORMAL LOW (ref 3.5–5.2)
Alkaline Phosphatase: 177 U/L — ABNORMAL HIGH (ref 39–117)
BUN: 45 mg/dL — ABNORMAL HIGH (ref 6–23)
CO2: 25 mEq/L (ref 19–32)
Calcium: 8.8 mg/dL (ref 8.4–10.5)
Chloride: 101 mEq/L (ref 96–112)
Creatinine, Ser: 0.8 mg/dL (ref 0.50–1.10)
GFR calc Af Amer: 81 mL/min — ABNORMAL LOW (ref >90)
GFR calc non Af Amer: 70 mL/min — ABNORMAL LOW (ref >90)
Glucose, Bld: 224 mg/dL — ABNORMAL HIGH (ref 70–99)
Potassium: 4.1 mEq/L (ref 3.5–5.1)
Sodium: 133 mEq/L — ABNORMAL LOW (ref 135–145)
Total Bilirubin: 0.6 mg/dL (ref 0.3–1.2)
Total Protein: 6.1 g/dL (ref 6.0–8.3)

## 2012-12-11 LAB — CBC WITH DIFFERENTIAL/PLATELET
Basophils Absolute: 0.1 10*3/uL (ref 0.0–0.1)
Basophils Relative: 0 % (ref 0–1)
Eosinophils Absolute: 0.1 10*3/uL (ref 0.0–0.7)
Eosinophils Relative: 1 % (ref 0–5)
HCT: 29.9 % — ABNORMAL LOW (ref 36.0–46.0)
Hemoglobin: 9.8 g/dL — ABNORMAL LOW (ref 12.0–15.0)
Lymphocytes Relative: 12 % (ref 12–46)
Lymphs Abs: 1.6 10*3/uL (ref 0.7–4.0)
MCH: 27.1 pg (ref 26.0–34.0)
MCHC: 32.8 g/dL (ref 30.0–36.0)
MCV: 82.8 fL (ref 78.0–100.0)
Monocytes Absolute: 1.2 10*3/uL — ABNORMAL HIGH (ref 0.1–1.0)
Monocytes Relative: 9 % (ref 3–12)
Neutro Abs: 10.5 10*3/uL — ABNORMAL HIGH (ref 1.7–7.7)
Neutrophils Relative %: 78 % — ABNORMAL HIGH (ref 43–77)
Platelets: 120 10*3/uL — ABNORMAL LOW (ref 150–400)
RBC: 3.61 MIL/uL — ABNORMAL LOW (ref 3.87–5.11)
RDW: 16.3 % — ABNORMAL HIGH (ref 11.5–15.5)
WBC: 13.4 10*3/uL — ABNORMAL HIGH (ref 4.0–10.5)

## 2012-12-11 LAB — POCT I-STAT TROPONIN I: Troponin i, poc: 0.01 ng/mL (ref 0.00–0.08)

## 2012-12-11 LAB — LIPASE, BLOOD: Lipase: 25 U/L (ref 11–59)

## 2012-12-11 MED ORDER — NITROGLYCERIN 0.4 MG SL SUBL
0.4000 mg | SUBLINGUAL_TABLET | SUBLINGUAL | Status: DC | PRN
  Administered 2012-12-11: 0.4 mg via SUBLINGUAL
  Filled 2012-12-11: qty 25

## 2012-12-11 MED ORDER — ONDANSETRON HCL 4 MG/2ML IJ SOLN
4.0000 mg | Freq: Once | INTRAMUSCULAR | Status: AC
  Administered 2012-12-11: 4 mg via INTRAVENOUS
  Filled 2012-12-11: qty 2

## 2012-12-11 NOTE — ED Provider Notes (Signed)
History     CSN: [id number]  Arrival date & time 12/11/12  2050   First MD Initiated Contact with Patient 12/11/12 2108      Chief Complaint  Patient presents with  . Abdominal Pain  . Diarrhea  . Nausea    (Consider location/radiation/quality/duration/timing/severity/associated sxs/prior treatment) HPI History provided by pt.  Pt a poor historian.  Reports that she felt nauseous and generally weak upon waking this morning so decided to stay in bed.  When he stood up to use the bathroom around lunch time, she developed lightheadedness, non-radiating, L-sided chest pressure and shakiness.  The CP resolved immediately upon taking 1 SL ntg, but returned again in the afternoon and has been intermittent ever since.  Nausea, lightheadedness and weakness has been persistent.  Has had 4 episodes of diarrhea, and diffuse lower abd cramping as well.  Denies fever, cough, SOB, urinary sx.  Pain in chest felt similar to MI in 1991.  Compliant w/ all medications.  Took 324 aspirin when EMS arrived at her house.  No h/o abdominal surgeries.   Past Medical History  Diagnosis Date  . Coronary artery disease     status post inferior wall myocardial infarction  . Diabetes mellitus   . Obesity   . Leg edema   . Sleep apnea   . Hypothyroidism   . Dyslipidemia   . Hypotension 08/02/2010    recent episodes of orthostatic hypotension  . Neuropathy of hand     left hand numbness/neuropathy  . Cirrhosis, non-alcoholic     Past Surgical History  Procedure Laterality Date  . Cardiac catheterization      The ejection fraction is around 50%  . Coronary stent placement    . Cholecystectomy    . Abdominal hysterectomy    . Tubal ligation      Family History  Problem Relation Age of Onset  . Emphysema Brother     non smoker  . Heart disease Father   . Diabetes Mother     History  Substance Use Topics  . Smoking status: Never Smoker   . Smokeless tobacco: Never Used  . Alcohol Use: No     OB History   Grav Para Term Preterm Abortions TAB SAB Ect Mult Living                  Review of Systems  All other systems reviewed and are negative.    Allergies  Ace inhibitors; Penicillins; and Sulfonamide derivatives  Home Medications   Current Outpatient Rx  Name  Route  Sig  Dispense  Refill  . atorvastatin (LIPITOR) 20 MG tablet   Oral   Take 20 mg by mouth daily.          . Calcium Carbonate-Vitamin D (CALCIUM + D PO)   Oral   Take 1 tablet by mouth daily.          . carvedilol (COREG) 12.5 MG tablet   Oral   Take 1 tablet (12.5 mg total) by mouth 2 (two) times daily.   60 tablet   6   . diphenoxylate-atropine (LOMOTIL) 2.5-0.025 MG per tablet   Oral   Take 1 tablet by mouth daily as needed for diarrhea or loose stools.         Marland Kitchen escitalopram (LEXAPRO) 10 MG tablet   Oral   Take 10 mg by mouth daily as needed (mood.).          Marland Kitchen furosemide (LASIX) 40 MG tablet  Oral   Take 40 mg by mouth daily.          Marland Kitchen HYDROcodone-acetaminophen (NORCO/VICODIN) 5-325 MG per tablet   Oral   Take 1 tablet by mouth every 6 (six) hours as needed for pain.         Marland Kitchen insulin aspart (NOVOLOG) 100 UNIT/ML injection   Subcutaneous   Inject 30 Units into the skin 3 (three) times daily before meals.          . insulin glargine (LANTUS) 100 UNIT/ML injection   Subcutaneous   Inject 45-50 Units into the skin See admin instructions. 50 units in the mornings and 45 units at night         . levothyroxine (SYNTHROID, LEVOTHROID) 125 MCG tablet   Oral   Take 125 mcg by mouth daily.          . Montelukast Sodium (SINGULAIR PO)   Oral   Take 10 mg by mouth every morning.          . Multiple Vitamin (MULTIVITAMIN WITH MINERALS) TABS   Oral   Take 1 tablet by mouth daily.         . nitroGLYCERIN (NITROSTAT) 0.4 MG SL tablet   Sublingual   Place 0.4 mg under the tongue every 5 (five) minutes as needed. Chest pain         . potassium chloride  (KLOR-CON) 10 MEQ CR tablet   Oral   Take 10 mEq by mouth 4 (four) times daily.          . promethazine (PHENERGAN) 25 MG tablet   Oral   Take 25 mg by mouth every 4 (four) hours as needed for nausea.         . ranitidine (ZANTAC) 150 MG tablet   Oral   Take 150 mg by mouth 2 (two) times daily.         Marland Kitchen aspirin EC 81 MG tablet   Oral   Take 81 mg by mouth daily.           BP 118/45  Pulse 74  Temp(Src) 97.8 F (36.6 C) (Oral)  Resp 21  SpO2 96%  Physical Exam  Nursing note and vitals reviewed. Constitutional: She is oriented to person, place, and time. She appears well-developed and well-nourished. No distress.  HENT:  Head: Normocephalic and atraumatic.  Mouth/Throat: Oropharynx is clear and moist.  Eyes:  Normal appearance  Neck: Normal range of motion.  Cardiovascular: Normal rate, regular rhythm and intact distal pulses.   Pulmonary/Chest: Effort normal and breath sounds normal. No respiratory distress. She exhibits no tenderness.  No pleuritic pain reported  Abdominal: Soft. Bowel sounds are normal. She exhibits no distension. There is no tenderness. There is no guarding.  Obese.  Mild epigastric and diffuse left-sided abd ttp  Musculoskeletal: Normal range of motion.  No peripheral edema or calf tenderness  Neurological: She is alert and oriented to person, place, and time.  Skin: Skin is warm and dry. No rash noted.  Psychiatric: She has a normal mood and affect. Her behavior is normal.    ED Course  Procedures (including critical care time)   Date: 12/12/2012  Rate: 74  Rhythm: normal sinus rhythm  QRS Axis: normal  Intervals: normal  ST/T Wave abnormalities: normal  Conduction Disutrbances:none  Narrative Interpretation:  Inferior q waves  Old EKG Reviewed: unchanged   Labs Reviewed  COMPREHENSIVE METABOLIC PANEL - Abnormal; Notable for the following:    Sodium 133 (*)  Glucose, Bld 224 (*)    BUN 45 (*)    Albumin 2.6 (*)     Alkaline Phosphatase 177 (*)    GFR calc non Af Amer 70 (*)    GFR calc Af Amer 81 (*)    All other components within normal limits  CBC WITH DIFFERENTIAL - Abnormal; Notable for the following:    WBC 13.4 (*)    RBC 3.61 (*)    Hemoglobin 9.8 (*)    HCT 29.9 (*)    RDW 16.3 (*)    Platelets 120 (*)    Neutrophils Relative 78 (*)    Neutro Abs 10.5 (*)    Monocytes Absolute 1.2 (*)    All other components within normal limits  LIPASE, BLOOD  POCT I-STAT TROPONIN I   Dg Chest 2 View  12/11/2012  *RADIOLOGY REPORT*  Clinical Data: Altered mental status.  Shortness of breath.  CHEST - 2 VIEW  Comparison: 06/02/2011  Findings: Mildly degraded exam due to AP portable technique and patient body habitus.  Apical lordotic patient positioning. Cardiomegaly accentuated by AP portable technique.  No right and no definite left pleural effusion. No pneumothorax.  Clear lungs.  IMPRESSION: No acute cardiopulmonary disease.   Original Report Authenticated By: Jeronimo Greaves, M.D.      1. Chest pain   2. Gastroenteritis       MDM  78yo obese F w/ diabetes, HTN and CAD presents w/ intermittent left-sided CP as well as orthotstatic lightheadedness, generalized weakness, nausea and diarrhea today.  CP feels similar to inferior wall MI in 1991.  Symptomatic upon arrival to ED.  No significant findings on exam.  EKG non-ischemic.  Labs and CXR pending.  Pt received 1 SL ntg, which she had taken earlier today w/ immediate relief, and SBP dropped to 65.  Pt placed in trendelenburg, second line placed and IVF bolused.  BP currently w/in nml limits and pt CP free.  Consulted [facility] and they will see patient prn.  [facility] consulted for admission for r/o ACS.    Otilio Miu, PA-C 12/12/12 0048  Otilio Miu, PA-C 12/12/12 [phone]

## 2012-12-11 NOTE — ED Notes (Signed)
PER EMS- pt picked up from home with c/o diarrhea, abd pain, and nausea x1 day.  PTA given 4mg  zofran and NS.  Alert and oriented.  Reports diarrhea x4 today.

## 2012-12-11 NOTE — ED Notes (Signed)
EKG old and new given to EDP, Ray,MD.

## 2012-12-11 NOTE — ED Notes (Signed)
GGY:[id number]<NI> Expected date:12/11/12<BR> Expected time: 8:31 PM<BR> Means of arrival:Ambulance<BR> Comments:<BR> 79 yo F epigastric pain

## 2012-12-12 ENCOUNTER — Encounter (HOSPITAL_COMMUNITY): Payer: Self-pay | Admitting: *Deleted

## 2012-12-12 ENCOUNTER — Encounter (HOSPITAL_COMMUNITY)
Admission: AD | Disposition: A | Discharge status: Home / Self Care | Payer: Self-pay | Source: Home / Self Care | Attending: Internal Medicine

## 2012-12-12 DIAGNOSIS — R079 Chest pain, unspecified: Secondary | ICD-10-CM | POA: Diagnosis present

## 2012-12-12 DIAGNOSIS — D62 Acute posthemorrhagic anemia: Secondary | ICD-10-CM | POA: Diagnosis present

## 2012-12-12 DIAGNOSIS — A0472 Enterocolitis due to Clostridium difficile, not specified as recurrent: Secondary | ICD-10-CM | POA: Diagnosis present

## 2012-12-12 DIAGNOSIS — J449 Chronic obstructive pulmonary disease, unspecified: Secondary | ICD-10-CM

## 2012-12-12 DIAGNOSIS — Z8719 Personal history of other diseases of the digestive system: Secondary | ICD-10-CM | POA: Diagnosis present

## 2012-12-12 DIAGNOSIS — R072 Precordial pain: Secondary | ICD-10-CM

## 2012-12-12 DIAGNOSIS — R0989 Other specified symptoms and signs involving the circulatory and respiratory systems: Secondary | ICD-10-CM

## 2012-12-12 DIAGNOSIS — R0609 Other forms of dyspnea: Secondary | ICD-10-CM

## 2012-12-12 HISTORY — PX: ESOPHAGOGASTRODUODENOSCOPY: SHX5428

## 2012-12-12 HISTORY — PX: FLEXIBLE SIGMOIDOSCOPY: SHX5431

## 2012-12-12 LAB — CBC
HCT: 26.4 % — ABNORMAL LOW (ref 36.0–46.0)
HCT: 21.4 % — ABNORMAL LOW (ref 36.0–46.0)
Hemoglobin: 8.6 g/dL — ABNORMAL LOW (ref 12.0–15.0)
Hemoglobin: 7 g/dL — ABNORMAL LOW (ref 12.0–15.0)
MCH: 27.1 pg (ref 26.0–34.0)
MCH: 27.2 pg (ref 26.0–34.0)
MCHC: 32.6 g/dL (ref 30.0–36.0)
MCHC: 32.7 g/dL (ref 30.0–36.0)
MCV: 83.3 fL (ref 78.0–100.0)
MCV: 83.3 fL (ref 78.0–100.0)
Platelets: 136 10*3/uL — ABNORMAL LOW (ref 150–400)
Platelets: 96 10*3/uL — ABNORMAL LOW (ref 150–400)
RBC: 3.17 MIL/uL — ABNORMAL LOW (ref 3.87–5.11)
RBC: 2.57 MIL/uL — ABNORMAL LOW (ref 3.87–5.11)
RDW: 16.5 % — ABNORMAL HIGH (ref 11.5–15.5)
RDW: 17.1 % — ABNORMAL HIGH (ref 11.5–15.5)
WBC: 12.5 10*3/uL — ABNORMAL HIGH (ref 4.0–10.5)
WBC: 9.7 10*3/uL (ref 4.0–10.5)

## 2012-12-12 LAB — GLUCOSE, CAPILLARY
Glucose-Capillary: 307 mg/dL — ABNORMAL HIGH (ref 70–99)
Glucose-Capillary: 327 mg/dL — ABNORMAL HIGH (ref 70–99)
Glucose-Capillary: 311 mg/dL — ABNORMAL HIGH (ref 70–99)
Glucose-Capillary: 240 mg/dL — ABNORMAL HIGH (ref 70–99)

## 2012-12-12 LAB — BASIC METABOLIC PANEL
BUN: 45 mg/dL — ABNORMAL HIGH (ref 6–23)
CO2: 22 mEq/L (ref 19–32)
Calcium: 8.8 mg/dL (ref 8.4–10.5)
Chloride: 106 mEq/L (ref 96–112)
Creatinine, Ser: 0.75 mg/dL (ref 0.50–1.10)
GFR calc Af Amer: >90 mL/min (ref >90)
GFR calc non Af Amer: 80 mL/min — ABNORMAL LOW (ref >90)
Glucose, Bld: 352 mg/dL — ABNORMAL HIGH (ref 70–99)
Potassium: 4.4 mEq/L (ref 3.5–5.1)
Sodium: 137 mEq/L (ref 135–145)

## 2012-12-12 LAB — PRO B NATRIURETIC PEPTIDE: Pro B Natriuretic peptide (BNP): 80.2 pg/mL (ref 0–450)

## 2012-12-12 LAB — URINALYSIS, ROUTINE W REFLEX MICROSCOPIC
Bilirubin Urine: NEGATIVE
Glucose, UA: >1000 mg/dL — AB
Hgb urine dipstick: NEGATIVE
Ketones, ur: 15 mg/dL — AB
Leukocytes, UA: NEGATIVE
Nitrite: NEGATIVE
Protein, ur: NEGATIVE mg/dL
Specific Gravity, Urine: 1.021 (ref 1.005–1.030)
Urobilinogen, UA: 0.2 mg/dL (ref 0.0–1.0)
pH: 5.5 (ref 5.0–8.0)

## 2012-12-12 LAB — HEMOGLOBIN A1C
Hgb A1c MFr Bld: 7.9 % — ABNORMAL HIGH (ref <5.7)
Mean Plasma Glucose: 180 mg/dL — ABNORMAL HIGH (ref <117)

## 2012-12-12 LAB — RETICULOCYTES
RBC.: 3.17 MIL/uL — ABNORMAL LOW (ref 3.87–5.11)
Retic Count, Absolute: 142.7 10*3/uL (ref 19.0–186.0)
Retic Ct Pct: 4.5 % — ABNORMAL HIGH (ref 0.4–3.1)

## 2012-12-12 LAB — TROPONIN I
Troponin I: <0.3 ng/mL (ref <0.30)
Troponin I: <0.3 ng/mL (ref <0.30)
Troponin I: <0.3 ng/mL (ref <0.30)

## 2012-12-12 LAB — OCCULT BLOOD X 1 CARD TO LAB, STOOL: Fecal Occult Bld: POSITIVE — AB

## 2012-12-12 LAB — URINE MICROSCOPIC-ADD ON

## 2012-12-12 LAB — TSH: TSH: 0.521 u[IU]/mL (ref 0.350–4.500)

## 2012-12-12 LAB — PREPARE RBC (CROSSMATCH)

## 2012-12-12 LAB — FOLATE: Folate: 16.1 ng/mL

## 2012-12-12 LAB — IRON AND TIBC
Iron: 294 ug/dL — ABNORMAL HIGH (ref 42–135)
Saturation Ratios: 90 % — ABNORMAL HIGH (ref 20–55)
TIBC: 325 ug/dL (ref 250–470)
UIBC: 31 ug/dL — ABNORMAL LOW (ref 125–400)

## 2012-12-12 LAB — VITAMIN B12: Vitamin B-12: 227 pg/mL (ref 211–911)

## 2012-12-12 LAB — OCCULT BLOOD GASTRIC / DUODENUM (SPECIMEN CUP): Occult Blood, Gastric: POSITIVE — AB

## 2012-12-12 LAB — CLOSTRIDIUM DIFFICILE BY PCR: Toxigenic C. Difficile by PCR: POSITIVE — AB

## 2012-12-12 LAB — FERRITIN: Ferritin: 39 ng/mL (ref 10–291)

## 2012-12-12 LAB — ABO/RH: ABO/RH(D): O POS

## 2012-12-12 SURGERY — EGD (ESOPHAGOGASTRODUODENOSCOPY)
Anesthesia: Moderate Sedation

## 2012-12-12 MED ORDER — ONDANSETRON HCL 4 MG/2ML IJ SOLN
4.0000 mg | Freq: Once | INTRAMUSCULAR | Status: AC
  Administered 2012-12-12: 4 mg via INTRAVENOUS
  Filled 2012-12-12: qty 2

## 2012-12-12 MED ORDER — ESCITALOPRAM OXALATE 10 MG PO TABS
10.0000 mg | ORAL_TABLET | Freq: Every day | ORAL | Status: DC | PRN
  Filled 2012-12-12: qty 1

## 2012-12-12 MED ORDER — MONTELUKAST SODIUM 4 MG PO CHEW
4.0000 mg | CHEWABLE_TABLET | Freq: Every day | ORAL | Status: DC
  Filled 2012-12-12: qty 1

## 2012-12-12 MED ORDER — HYDROCODONE-ACETAMINOPHEN 5-325 MG PO TABS
1.0000 | ORAL_TABLET | ORAL | Status: DC | PRN
  Administered 2012-12-13: 1 via ORAL
  Filled 2012-12-12: qty 1

## 2012-12-12 MED ORDER — PANTOPRAZOLE SODIUM 40 MG PO TBEC
40.0000 mg | DELAYED_RELEASE_TABLET | Freq: Two times a day (BID) | ORAL | Status: DC
  Administered 2012-12-13 – 2012-12-16 (×7): 40 mg via ORAL
  Filled 2012-12-12 (×9): qty 1

## 2012-12-12 MED ORDER — DIPHENOXYLATE-ATROPINE 2.5-0.025 MG PO TABS
1.0000 | ORAL_TABLET | Freq: Every day | ORAL | Status: DC | PRN
  Administered 2012-12-12: 1 via ORAL
  Filled 2012-12-12: qty 1

## 2012-12-12 MED ORDER — DIPHENHYDRAMINE HCL 25 MG PO CAPS
25.0000 mg | ORAL_CAPSULE | Freq: Once | ORAL | Status: AC
  Administered 2012-12-12: 25 mg via ORAL
  Filled 2012-12-12: qty 1

## 2012-12-12 MED ORDER — FAMOTIDINE 20 MG PO TABS
20.0000 mg | ORAL_TABLET | Freq: Two times a day (BID) | ORAL | Status: DC
  Filled 2012-12-12 (×2): qty 1

## 2012-12-12 MED ORDER — INSULIN ASPART 100 UNIT/ML ~~LOC~~ SOLN
30.0000 [IU] | Freq: Three times a day (TID) | SUBCUTANEOUS | Status: DC
  Administered 2012-12-13 (×2): 30 [IU] via SUBCUTANEOUS

## 2012-12-12 MED ORDER — SODIUM CHLORIDE 0.9 % IV SOLN
8.0000 mg/h | INTRAVENOUS | Status: DC
  Administered 2012-12-12 (×2): 8 mg/h via INTRAVENOUS
  Filled 2012-12-12 (×4): qty 80

## 2012-12-12 MED ORDER — INSULIN GLARGINE 100 UNIT/ML ~~LOC~~ SOLN
25.0000 [IU] | Freq: Every day | SUBCUTANEOUS | Status: DC
  Administered 2012-12-12: 25 [IU] via SUBCUTANEOUS
  Filled 2012-12-12 (×2): qty 0.25

## 2012-12-12 MED ORDER — PROMETHAZINE HCL 25 MG PO TABS: 25.0000 mg | ORAL_TABLET | ORAL | Status: DC | PRN

## 2012-12-12 MED ORDER — INSULIN ASPART 100 UNIT/ML ~~LOC~~ SOLN
0.0000 [IU] | SUBCUTANEOUS | Status: DC
  Administered 2012-12-12: 3 [IU] via SUBCUTANEOUS
  Administered 2012-12-12 (×2): 7 [IU] via SUBCUTANEOUS
  Administered 2012-12-13: 3 [IU] via SUBCUTANEOUS
  Administered 2012-12-13: 2 [IU] via SUBCUTANEOUS
  Administered 2012-12-13: 1 [IU] via SUBCUTANEOUS
  Administered 2012-12-13 – 2012-12-14 (×2): 2 [IU] via SUBCUTANEOUS
  Administered 2012-12-14: 1 [IU] via SUBCUTANEOUS
  Administered 2012-12-14 (×2): 2 [IU] via SUBCUTANEOUS

## 2012-12-12 MED ORDER — ENOXAPARIN SODIUM 30 MG/0.3ML ~~LOC~~ SOLN: 30.0000 mg | SUBCUTANEOUS | Status: DC

## 2012-12-12 MED ORDER — PROMETHAZINE HCL 25 MG/ML IJ SOLN
12.5000 mg | Freq: Four times a day (QID) | INTRAMUSCULAR | Status: DC | PRN
  Administered 2012-12-12 – 2012-12-13 (×2): 12.5 mg via INTRAVENOUS
  Filled 2012-12-12 (×2): qty 1

## 2012-12-12 MED ORDER — SODIUM CHLORIDE 0.9 % IV SOLN: INTRAVENOUS | Status: DC

## 2012-12-12 MED ORDER — METRONIDAZOLE 500 MG PO TABS
500.0000 mg | ORAL_TABLET | Freq: Three times a day (TID) | ORAL | Status: DC
  Administered 2012-12-12 – 2012-12-16 (×12): 500 mg via ORAL
  Filled 2012-12-12 (×15): qty 1

## 2012-12-12 MED ORDER — MONTELUKAST SODIUM 5 MG PO CHEW
5.0000 mg | CHEWABLE_TABLET | Freq: Every day | ORAL | Status: DC
  Administered 2012-12-12 – 2012-12-15 (×4): 5 mg via ORAL
  Filled 2012-12-12 (×7): qty 1

## 2012-12-12 MED ORDER — BUTAMBEN-TETRACAINE-BENZOCAINE 2-2-14 % EX AERO
INHALATION_SPRAY | CUTANEOUS | Status: DC | PRN
  Administered 2012-12-12: 2 via TOPICAL

## 2012-12-12 MED ORDER — ACETAMINOPHEN 325 MG PO TABS
650.0000 mg | ORAL_TABLET | Freq: Once | ORAL | Status: AC
  Administered 2012-12-12: 650 mg via ORAL
  Filled 2012-12-12: qty 2

## 2012-12-12 MED ORDER — SODIUM CHLORIDE 0.9 % IV SOLN
INTRAVENOUS | Status: DC
  Administered 2012-12-12 – 2012-12-14 (×5): via INTRAVENOUS

## 2012-12-12 MED ORDER — ASPIRIN EC 81 MG PO TBEC
81.0000 mg | DELAYED_RELEASE_TABLET | Freq: Every day | ORAL | Status: DC
Start: 2012-12-12 — End: 2012-12-12
  Filled 2012-12-12: qty 1

## 2012-12-12 MED ORDER — INSULIN GLARGINE 100 UNIT/ML ~~LOC~~ SOLN
50.0000 [IU] | Freq: Every day | SUBCUTANEOUS | Status: DC
  Filled 2012-12-12: qty 0.5

## 2012-12-12 MED ORDER — MIDAZOLAM HCL 10 MG/2ML IJ SOLN
INTRAMUSCULAR | Status: DC | PRN
  Administered 2012-12-12: 1 mg via INTRAVENOUS
  Administered 2012-12-12: 2 mg via INTRAVENOUS

## 2012-12-12 MED ORDER — INSULIN GLARGINE 100 UNIT/ML ~~LOC~~ SOLN: 45.0000 [IU] | Freq: Every day | SUBCUTANEOUS | Status: DC

## 2012-12-12 MED ORDER — ONDANSETRON HCL 4 MG/2ML IJ SOLN
4.0000 mg | Freq: Three times a day (TID) | INTRAMUSCULAR | Status: AC | PRN
  Administered 2012-12-12: 4 mg via INTRAVENOUS
  Filled 2012-12-12: qty 2

## 2012-12-12 MED ORDER — INSULIN ASPART 100 UNIT/ML ~~LOC~~ SOLN: 0.0000 [IU] | Freq: Three times a day (TID) | SUBCUTANEOUS | Status: DC

## 2012-12-12 MED ORDER — SODIUM CHLORIDE 0.9 % IJ SOLN
3.0000 mL | Freq: Two times a day (BID) | INTRAMUSCULAR | Status: DC
  Administered 2012-12-12 – 2012-12-16 (×6): 3 mL via INTRAVENOUS

## 2012-12-12 MED ORDER — CARVEDILOL 12.5 MG PO TABS
12.5000 mg | ORAL_TABLET | Freq: Two times a day (BID) | ORAL | Status: DC
  Administered 2012-12-12 (×2): 12.5 mg via ORAL
  Filled 2012-12-12 (×7): qty 1

## 2012-12-12 MED ORDER — ENOXAPARIN SODIUM 40 MG/0.4ML ~~LOC~~ SOLN
40.0000 mg | SUBCUTANEOUS | Status: DC
  Filled 2012-12-12: qty 0.4

## 2012-12-12 MED ORDER — ATORVASTATIN CALCIUM 20 MG PO TABS
20.0000 mg | ORAL_TABLET | Freq: Every day | ORAL | Status: DC
  Administered 2012-12-12 – 2012-12-15 (×4): 20 mg via ORAL
  Filled 2012-12-12 (×6): qty 1

## 2012-12-12 MED ORDER — INSULIN GLARGINE 100 UNIT/ML ~~LOC~~ SOLN
50.0000 [IU] | Freq: Every day | SUBCUTANEOUS | Status: DC
  Administered 2012-12-13: 50 [IU] via SUBCUTANEOUS
  Filled 2012-12-12: qty 0.5

## 2012-12-12 MED ORDER — INSULIN GLARGINE 100 UNIT/ML ~~LOC~~ SOLN
22.0000 [IU] | Freq: Every day | SUBCUTANEOUS | Status: DC
  Filled 2012-12-12: qty 0.22

## 2012-12-12 MED ORDER — FUROSEMIDE 40 MG PO TABS
40.0000 mg | ORAL_TABLET | Freq: Every day | ORAL | Status: DC
  Administered 2012-12-12: 40 mg via ORAL
  Filled 2012-12-12 (×3): qty 1

## 2012-12-12 MED ORDER — INSULIN GLARGINE 100 UNIT/ML ~~LOC~~ SOLN
45.0000 [IU] | Freq: Every day | SUBCUTANEOUS | Status: DC
  Administered 2012-12-12: 45 [IU] via SUBCUTANEOUS
  Filled 2012-12-12 (×2): qty 0.45

## 2012-12-12 MED ORDER — LEVOTHYROXINE SODIUM 125 MCG PO TABS
125.0000 ug | ORAL_TABLET | Freq: Every day | ORAL | Status: DC
  Administered 2012-12-12 – 2012-12-16 (×5): 125 ug via ORAL
  Filled 2012-12-12 (×8): qty 1

## 2012-12-12 NOTE — Consult Note (Signed)
Referring Provider: Ramiro Harvest, M.D. Primary Care Physician:  Virl Cagey, NP [facility]) Primary Gastroenterologist:  Dr. Dorena Cookey   Reason for Consultation:  GI bleeding   HPI: [name] is a 79 y.o. female Admitted to the hospital last night because of a sinking spell at home and some vague chest pain. Subsequently, in the hospital last night, she had hematemesis and dark stools confirmed to be Hemoccult positive.   Her hemoglobin on admission was 9.8, as compared to 14 a year ago, and overnight, it has dropped further to 8.6, although it is not clear whether that drop reflects equilibration or bleeding. Her BUN was 45 on admission, but has not been repeated. Ferritin and B12 levels have been normal.  She has not had any further hematemesis today. She has not had any further dark stools.  The patient is on a daily 81 mg aspirin tablet and is on Zantac 150 twice a day, since Nexium was too expensive for her.  The patient's family indicates that the patient had some sort of endoscopic procedure (not clear whether endoscopy or colonoscopy) by Dr. Chales Abrahams in [place].  Curiously, the patient was also found to have a positive C. difficile toxin since admission to the hospital. She has not been on recent antibiotics or nor has she been institutionalized. Moreover, she has not had any symptoms of C. difficile colitis, specifically, no watery diarrhea.  The chart mentions cirrhosis, but details are sketchy. An ultrasound in October of 2012 showed fatty infiltration of the liver, where as a CT scan of the abdomen last September called the "stable cirrhosis with borderline splenomegaly." The patient has certainly never had any recognized complications of liver disease such as variceal hemorrhage or ascites. She did, in the past, have elevated liver chemistries.    Past Medical History  Diagnosis Date  . Coronary artery disease     status post inferior wall myocardial  infarction  . Diabetes mellitus   . Obesity   . Leg edema   . Sleep apnea   . Hypothyroidism   . Dyslipidemia   . Hypotension 08/02/2010    recent episodes of orthostatic hypotension  . Neuropathy of hand     left hand numbness/neuropathy  . Cirrhosis, non-alcoholic     Past Surgical History  Procedure Laterality Date  . Cardiac catheterization      The ejection fraction is around 50%  . Coronary stent placement    . Cholecystectomy    . Abdominal hysterectomy    . Tubal ligation      Prior to Admission medications   Medication Sig Start Date End Date Taking? Authorizing Provider  atorvastatin (LIPITOR) 20 MG tablet Take 20 mg by mouth daily.    Yes Historical Provider, MD  Calcium Carbonate-Vitamin D (CALCIUM + D PO) Take 1 tablet by mouth daily.    Yes Historical Provider, MD  carvedilol (COREG) 12.5 MG tablet Take 1 tablet (12.5 mg total) by mouth 2 (two) times daily. 07/16/12 07/16/13 Yes Vesta Mixer, MD  diphenoxylate-atropine (LOMOTIL) 2.5-0.025 MG per tablet Take 1 tablet by mouth daily as needed for diarrhea or loose stools.   Yes Historical Provider, MD  escitalopram (LEXAPRO) 10 MG tablet Take 10 mg by mouth daily as needed (mood.).    Yes Historical Provider, MD  furosemide (LASIX) 40 MG tablet Take 40 mg by mouth daily.    Yes Historical Provider, MD  HYDROcodone-acetaminophen (NORCO/VICODIN) 5-325 MG per tablet Take 1 tablet by  mouth every 6 (six) hours as needed for pain.   Yes Historical Provider, MD  insulin aspart (NOVOLOG) 100 UNIT/ML injection Inject 30 Units into the skin 3 (three) times daily before meals.    Yes Historical Provider, MD  insulin glargine (LANTUS) 100 UNIT/ML injection Inject 45-50 Units into the skin See admin instructions. 50 units in the mornings and 45 units at night   Yes Historical Provider, MD  levothyroxine (SYNTHROID, LEVOTHROID) 125 MCG tablet Take 125 mcg by mouth daily.    Yes Historical Provider, MD  Montelukast Sodium  (SINGULAIR PO) Take 10 mg by mouth every morning.    Yes Historical Provider, MD  Multiple Vitamin (MULTIVITAMIN WITH MINERALS) TABS Take 1 tablet by mouth daily.   Yes Historical Provider, MD  nitroGLYCERIN (NITROSTAT) 0.4 MG SL tablet Place 0.4 mg under the tongue every 5 (five) minutes as needed. Chest pain   Yes Historical Provider, MD  potassium chloride (KLOR-CON) 10 MEQ CR tablet Take 10 mEq by mouth 4 (four) times daily.    Yes Historical Provider, MD  promethazine (PHENERGAN) 25 MG tablet Take 25 mg by mouth every 4 (four) hours as needed for nausea.   Yes Historical Provider, MD  ranitidine (ZANTAC) 150 MG tablet Take 150 mg by mouth 2 (two) times daily.   Yes Historical Provider, MD  aspirin EC 81 MG tablet Take 81 mg by mouth daily.    Historical Provider, MD    Current Facility-Administered Medications  Medication Dose Route Frequency Provider Last Rate Last Dose  . 0.9 %  sodium chloride infusion   Intravenous Continuous Rodolph Bong, MD 100 mL/hr at 12/12/12 1356    . 0.9 %  sodium chloride infusion   Intravenous Continuous Katy Fitch Aberdeen Hafen, MD      . acetaminophen (TYLENOL) tablet 650 mg  650 mg Oral Once Rodolph Bong, MD      . atorvastatin (LIPITOR) tablet 20 mg  20 mg Oral Daily Dorothea Ogle, MD      . carvedilol (COREG) tablet 12.5 mg  12.5 mg Oral BID Dorothea Ogle, MD   12.5 mg at 12/12/12 1110  . diphenhydrAMINE (BENADRYL) capsule 25 mg  25 mg Oral Once Rodolph Bong, MD      . escitalopram (LEXAPRO) tablet 10 mg  10 mg Oral Daily PRN Dorothea Ogle, MD      . HYDROcodone-acetaminophen (NORCO/VICODIN) 5-325 MG per tablet 1-2 tablet  1-2 tablet Oral Q4H PRN Dorothea Ogle, MD      . insulin aspart (novoLOG) injection 0-9 Units  0-9 Units Subcutaneous Q4H Leda Gauze, NP   7 Units at 12/12/12 1337  . insulin aspart (novoLOG) injection 30 Units  30 Units Subcutaneous TID AC Dorothea Ogle, MD      . insulin glargine (LANTUS) injection 22 Units  22 Units  Subcutaneous QHS Rodolph Bong, MD      . insulin glargine (LANTUS) injection 25 Units  25 Units Subcutaneous Daily Rodolph Bong, MD   25 Units at 12/12/12 1335  . levothyroxine (SYNTHROID, LEVOTHROID) tablet 125 mcg  125 mcg Oral Daily Dorothea Ogle, MD   125 mcg at 12/12/12 0754  . metroNIDAZOLE (FLAGYL) tablet 500 mg  500 mg Oral Q8H Rodolph Bong, MD   500 mg at 12/12/12 1336  . montelukast (SINGULAIR) chewable tablet 5 mg  5 mg Oral QHS Dorothea Ogle, MD      . nitroGLYCERIN (NITROSTAT) SL tablet  0.4 mg  0.4 mg Sublingual Q5 min PRN Otilio Miu, PA-C   0.4 mg at 12/11/12 2146  . pantoprazole (PROTONIX) 80 mg in sodium chloride 0.9 % 250 mL infusion  8 mg/hr Intravenous Continuous Leda Gauze, NP 25 mL/hr at 12/12/12 1345 8 mg/hr at 12/12/12 1345  . promethazine (PHENERGAN) injection 12.5 mg  12.5 mg Intravenous Q6H PRN Leda Gauze, NP   12.5 mg at 12/12/12 0516  . sodium chloride 0.9 % injection 3 mL  3 mL Intravenous Q12H Dorothea Ogle, MD   3 mL at 12/12/12 1112    Allergies as of 12/11/2012 - Review Complete 12/11/2012  Allergen Reaction Noted  . Ace inhibitors Cough 04/03/2011  . Penicillins Hives and Swelling   . Sulfonamide derivatives Swelling     Family History  Problem Relation Age of Onset  . Emphysema Brother     non smoker  . Heart disease Father   . Diabetes Mother     History   Social History  . Marital Status: Married    Spouse Name: N/A    Number of Children: N/A  . Years of Education: N/A   Occupational History  . Not on file.   Social History Main Topics  . Smoking status: Never Smoker   . Smokeless tobacco: Never Used  . Alcohol Use: No  . Drug Use: No  . Sexually Active: Not on file   Other Topics Concern  . Not on file   Social History Narrative  . No narrative on file    Review of Systems: it is difficult to get a clear understanding, but it sounds as though the patient has been bothered by some  degree of dyspeptic symptomatology recently   Physical Exam: Vital signs in last 24 hours: Temp:  [97.8 F (36.6 C)-98.7 F (37.1 C)] 98.1 F (36.7 C) (04/25 1810) Pulse Rate:  [71-90] 74 (04/25 1810) Resp:  [12-61] 18 (04/25 1810) BP: (92-130)/(33-95) 107/48 mmHg (04/25 1810) SpO2:  [90 %-100 %] 99 % (04/25 1810) Weight:  [88.7 kg (195 lb 8.8 oz)-89 kg (196 lb 3.4 oz)] 89 kg (196 lb 3.4 oz) (04/25 0500) Last BM Date: 12/12/12 This is a severely overweight white female in no acute distress. She is anicteric but has mild pallor. Radial pulses full. Oropharynx benign. Chest clear. Heart without murmur or arrhythmia. Abdomen obese, unable to feel liver or spleen, no guarding, mass effect, or tenderness.    Intake/Output from previous day: 04/24 0701 - 04/25 0700 In: 302.5 [I.V.:302.5] Out: -  Intake/Output this shift: Total I/O In: 574.2 [I.V.:574.2] Out: 1000 [Urine:1000]  Lab Results:  Recent Labs  12/11/12 2103 12/12/12 0535 12/12/12 1304  WBC 13.4* 12.5* 9.7  HGB 9.8* 8.6* 7.0*  HCT 29.9* 26.4* 21.4*  PLT 120* 136* 96*   BMET  Recent Labs  12/11/12 2103 12/12/12 0535  NA 133* 137  K 4.1 4.4  CL 101 106  CO2 25 22  GLUCOSE 224* 352*  BUN 45* 45*  CREATININE 0.80 0.75  CALCIUM 8.8 8.8   LFT  Recent Labs  12/11/12 2103  PROT 6.1  ALBUMIN 2.6*  AST 27  ALT 26  ALKPHOS 177*  BILITOT 0.6   PT/INR No results found for this basename: LABPROT, INR,  in the last 72 hours  C-Diff Positive   Studies/Results: Dg Chest 2 View  12/11/2012  *RADIOLOGY REPORT*  Clinical Data: Altered mental status.  Shortness of breath.  CHEST - 2 VIEW  Comparison: 06/02/2011  Findings: Mildly degraded exam due to AP portable technique and patient body habitus.  Apical lordotic patient positioning. Cardiomegaly accentuated by AP portable technique.  No right and no definite left pleural effusion. No pneumothorax.  Clear lungs.  IMPRESSION: No acute cardiopulmonary disease.    Original Report Authenticated By: Jeronimo Greaves, M.D.     Impression:   1. Hematemesis and dark stools 2. Elevated BUN, presyncope and weakness, suggestive of recent GI bleed 3. Significant decrease in hemoglobin from one year ago, although the degree to which that reflects acute posthemorrhagic anemia is not clear 4. Positive C. difficile toxin assay on this admission, without clinical correlation 5. History of "cirrhosis" with steatosis, previous elevation of liver chemistries, and current thrombocytopenia 6. Recent GI tract evaluation of some sort performed in [place]   Plan: 1. Endoscopic evaluation this afternoon for evaluation of GI bleeding. Purpose and risks reviewed with patient and family. Anticipate light sedation in view of her history of sleep apnea and her body habitus 2. Concurrent sigmoidoscopic evaluation to look for evidence of C. difficile    LOS: 1 day   Caeleigh Prohaska V  12/12/2012, 6:13 PM

## 2012-12-12 NOTE — ED Provider Notes (Signed)
79 y.o. Female with history of cad presents with left sided intermittent chest pain today relieved with nitro and nausea and vomiting.  Patient with some mild epigastric ttp on my exam.  OW normal exam   I performed a history and physical examination of [name] and discussed her management with Occupational hygienist.  I agree with the history, physical, assessment, and plan of care, with the following exceptions: None  I was present for the following procedures: None Time Spent in Critical Care of the patient: None Time spent in discussions with the patient and family: 5  [name] Shelda Jakes, MD 12/12/12 2258

## 2012-12-12 NOTE — ED Provider Notes (Signed)
79 year old female with cardiac history who comes in today complaining of nausea, diarrhea, and some left-sided chest pain earlier today. Chest pain resolved with nitroglycerin at home and a second nitroglycerin here. She had one episode of hypotension after she received nitroglycerin which has resolved. Heart is regular rate and rhythm abdomen soft and nontender.   I performed a history and physical examination of [name] and discussed her management with Occupational hygienist.  I agree with the history, physical, assessment, and plan of care, with the following exceptions: None  I was present for the following procedures: None Time Spent in Critical Care of the patient: None Time spent in discussions with the patient and family: 5  Nirvi Boehler S    Shaune Pollack, MD 12/12/12 [phone]

## 2012-12-12 NOTE — Progress Notes (Signed)
   CARE MANAGEMENT NOTE 12/12/2012  Patient:  [name], [name]   Account Number:  [id number]  Date Initiated:  12/12/2012  Documentation initiated by:  Jiles Crocker  Subjective/Objective Assessment:   ADMITTED WITH CHEST PAIN/ VOMITING BRB     Action/Plan:   PCP: Virl Cagey, NP  LIVES ALONE; CM FOLLOWING FOR DCP   Anticipated DC Date:  12/15/2012   Anticipated DC Plan:  HOME/SELF CARE      DC Planning Services  CM consult         Status of service:  In process, will continue to follow Medicare Important Message given?  NA - LOS <3 / Initial given by admissions (If response is "NO", the following Medicare IM given date fields will be blank) Per UR Regulation:  Reviewed for med. necessity/level of care/duration of stay Comments:  12/12/2012- B Yusef Lamp RN,BSN,MHA

## 2012-12-12 NOTE — Progress Notes (Signed)
C-diff positive text sent to Dr Janee Morn.

## 2012-12-12 NOTE — Interval H&P Note (Signed)
History and Physical Interval Note:  12/12/2012 5:13 PM  [name]  has presented today for surgery, with the diagnosis of GI bleeding and c difficile  The various methods of treatment have been discussed with the patient and family. After consideration of risks, benefits and other options for treatment, the patient has consented to  Procedure(s): ESOPHAGOGASTRODUODENOSCOPY (EGD) (N/A) FLEXIBLE SIGMOIDOSCOPY (N/A) as a surgical intervention .  The patient's history has been reviewed, patient examined, no change in status, stable for surgery.  I have reviewed the patient's chart and labs.  Questions were answered to the patient's satisfaction.     Florencia Reasons

## 2012-12-12 NOTE — Progress Notes (Signed)
[facility] PROGRESS NOTE  [name] UXN:[id number] DOB: 01-01-1934 DOA: 12/11/2012 PCP: [name], NP  Assessment/Plan: #1 porbable upper GI bleed Overnight patient noted to have hematemesis and melanotic stools which were guaiac positive. Patient's hemoglobin trending downwards. Patient currently on a Protonix drip. Patient also currently n.p.o. Continue serial CBCs every 8 hours. We'll consult with GI for further evaluation and management. Hydrate gently with IV fluids. Will discontinue diuretics for now.  #2 chest pain Questionable etiology. May be secondary to problem #1 as patient was noted to have hematemesis and melanotic stools. Patient also however with a significant cardiac history. Cardiac enzymes negative x3. 2-D echo is pending. Will hold diuretics secondary to problem #1. Continue Coreg, Lipitor.  #3 C. difficile colitis Patient noted to be C. difficile positive. Patient denies any significant diarrhea.. Start Flagyl.  #4 diabetes mellitus Stat half home dose Lantus as patient is n.p.o. Sliding scale insulin.  #5 hypothyroidism Continue home dose Synthroid.  #6 leukocytosis May be secondary to C. difficile colitis. Urinalysis is negative. White count has trended down. Patient started on Flagyl.  #7 prophylaxis PPI for GI prophylaxis. SCDs for DVT prophylaxis.   Code Status: Full Family Communication: Updated patient no family at bedside Disposition Plan: Home when medically stable   Consultants:  Gastroenterology: Dr. [name] 12/12/2012  Procedures:  EGD pending  Antibiotics:  None  HPI/Subjective: Events overnight noted to the patient went hematemesis or melanotic stools. Patient states chest pain somewhat improved from admission.  Objective: Filed Vitals:   12/12/12 0230 12/12/12 0500 12/12/12 0627 12/12/12 0753  BP: 105/51  116/95 122/52  Pulse: 85  90   Temp: 98.6 F (37 C)  98.7 F (37.1 C)   TempSrc: Oral  Oral   Resp: 22   20   Height: 5' 1"  (1.549 m)     Weight: 88.7 kg (195 lb 8.8 oz) 89 kg (196 lb 3.4 oz)    SpO2: 90%  96%     Intake/Output Summary (Last 24 hours) at 12/12/12 1215 Last data filed at 12/12/12 1036  Gross per 24 hour  Intake  302.5 ml  Output    350 ml  Net  -47.5 ml   Filed Weights   12/12/12 0230 12/12/12 0500  Weight: 88.7 kg (195 lb 8.8 oz) 89 kg (196 lb 3.4 oz)    Exam:   General:  NAD  Cardiovascular: RRR  Respiratory: CTAB  Abdomen: Soft, some tenderness to palpation in the upper abdomen diffuse, nondistended, positive bowel sounds.  Musculoskeletal: 5/5 bue STRENGTH, 5/5 BLE strength  Data Reviewed: Basic Metabolic Panel:  Recent Labs Lab 12/11/12 2103 12/12/12 0535  NA 133* 137  K 4.1 4.4  CL 101 106  CO2 25 22  GLUCOSE 224* 352*  BUN 45* 45*  CREATININE 0.80 0.75  CALCIUM 8.8 8.8   Liver Function Tests:  Recent Labs Lab 12/11/12 2103  AST 27  ALT 26  ALKPHOS 177*  BILITOT 0.6  PROT 6.1  ALBUMIN 2.6*    Recent Labs Lab 12/11/12 2103  LIPASE 25   No results found for this basename: AMMONIA,  in the last 168 hours CBC:  Recent Labs Lab 12/11/12 2103 12/12/12 0535  WBC 13.4* 12.5*  NEUTROABS 10.5*  --   HGB 9.8* 8.6*  HCT 29.9* 26.4*  MCV 82.8 83.3  PLT 120* 136*   Cardiac Enzymes:  Recent Labs Lab 12/12/12 0130 12/12/12 0700  TROPONINI <0.30 <0.30   BNP (last 3 results)  Recent Labs  11/25/12 1534 12/12/12 0100  PROBNP 90.0 80.2   CBG:  Recent Labs Lab 12/12/12 0412 12/12/12 0804 12/12/12 1159  GLUCAP 307* 327* 311*    Recent Results (from the past 240 hour(s))  CLOSTRIDIUM DIFFICILE BY PCR     Status: Abnormal   Collection Time    12/12/12  3:36 AM      Result Value Range Status   C difficile by pcr POSITIVE (*) NEGATIVE Final   Comment: CRITICAL RESULT CALLED TO, READ BACK BY AND VERIFIED WITH:     T. SCRONCE 9:45 12/12/12 ([email or web address])     Studies: Dg Chest 2 View  12/11/2012  *RADIOLOGY REPORT*   Clinical Data: Altered mental status.  Shortness of breath.  CHEST - 2 VIEW  Comparison: 06/02/2011  Findings: Mildly degraded exam due to AP portable technique and patient body habitus.  Apical lordotic patient positioning. Cardiomegaly accentuated by AP portable technique.  No right and no definite left pleural effusion. No pneumothorax.  Clear lungs.  IMPRESSION: No acute cardiopulmonary disease.   Original Report Authenticated By: Abigail Miyamoto, M.D.     Scheduled Meds: . atorvastatin  20 mg Oral Daily  . carvedilol  12.5 mg Oral BID  . insulin aspart  0-9 Units Subcutaneous Q4H  . insulin aspart  30 Units Subcutaneous TID AC  . insulin glargine  22 Units Subcutaneous QHS  . insulin glargine  25 Units Subcutaneous Daily  . levothyroxine  125 mcg Oral Daily  . metroNIDAZOLE  500 mg Oral Q8H  . montelukast  5 mg Oral QHS  . sodium chloride  3 mL Intravenous Q12H   Continuous Infusions: . sodium chloride 75 mL/hr at 12/12/12 0258  . pantoprozole (PROTONIX) infusion 8 mg/hr (12/12/12 0304)    Active Problems:   Clostridium difficile colitis    Time spent: > 35 mins    [facility] Pager [phone]. If 7PM-7AM, please contact night-coverage at [email or web address], password [facility] 12/12/2012, 12:15 PM  LOS: 1 day

## 2012-12-12 NOTE — H&P (Signed)
[facility] History and Physical  [name] MVH:[id number] DOB: 1934-01-19 DOA: 12/11/2012  Referring physician: ED physician PCP: Virl Cagey, NP   Chief Complaint: chest pain  HPI:  Pt is 79 yo female with complex and multiple medical problems outlined below, presents to [facility] ED with main concern of substernal chest discomfort that started initially one day prior to admission, pressure - like, non radiating, 4/10 in severity when present, associated with nausea and malaise, exertional shortness of breath. Pt reports similar events in the past but unable to recall details. Pt denies fevers, chills, no other abdominal or urinary concerns, no specific alleviating or aggravating factors, no headaches or visual changes, no focal neurological symptoms.   In ED, pt with persistent discomfort, [facility] asked to admit for chest pain rule out. ED doc consulted cardio and they will see in consultation.   Assessment and Plan:  Chest pain - in pt with multiple risk factors - will admit to telemetry and obtain CE's, TSH, 12 - lead EKG - supportive care with nitroglycerin, analgesia as needed, antiemetics as needed - continue aspirin  Diabetes mellitus, uncontrolled with neuropathy and gastroparesis - will check A1C - continue home regimen with insulin and add SSI Hypothyroidism - will check TSH and continue synthroid  Anemia of chronic disease - Hg appears to be slightly below her usual normal Hg but last one in the [facility] was in 2013 and was normal  - I will check anemia panel and FOBT for now - CBC in AM Leukocytosis - unclear etiology - pt is afebrile and with no specific symptoms suggestive of an infectious etiology - will check UA - CBC in AM   Code Status: Full Family Communication: Pt at bedside Disposition Plan: Admit to telemetry unit   Review of Systems:  Constitutional: Negative for fever, chills and malaise/fatigue. Negative for diaphoresis.  HENT: Negative for hearing  loss, ear pain, nosebleeds, congestion, sore throat, neck pain, tinnitus and ear discharge.   Eyes: Negative for blurred vision, double vision, photophobia, pain, discharge and redness.  Respiratory: Negative for cough, hemoptysis, sputum production, wheezing and stridor.   Cardiovascular: Negative for palpitations, orthopnea, claudication and leg swelling.  Gastrointestinal: Negative for vomiting and abdominal pain. Negative for heartburn, constipation, blood in stool and melena.  Genitourinary: Negative for dysuria, urgency, frequency, hematuria and flank pain.  Musculoskeletal: Negative for myalgias, back pain, joint pain and falls.  Skin: Negative for itching and rash.  Neurological: Negative for dizziness and weakness. Negative for tingling, tremors, sensory change, speech change, focal weakness, loss of consciousness and headaches.  Endo/Heme/Allergies: Negative for environmental allergies and polydipsia. Does not bruise/bleed easily.  Psychiatric/Behavioral: Negative for suicidal ideas. The patient is not nervous/anxious.      Past Medical History  Diagnosis Date  . Coronary artery disease     status post inferior wall myocardial infarction  . Diabetes mellitus   . Obesity   . Leg edema   . Sleep apnea   . Hypothyroidism   . Dyslipidemia   . Hypotension 08/02/2010    recent episodes of orthostatic hypotension  . Neuropathy of hand     left hand numbness/neuropathy  . Cirrhosis, non-alcoholic     Past Surgical History  Procedure Laterality Date  . Cardiac catheterization      The ejection fraction is around 50%  . Coronary stent placement    . Cholecystectomy    . Abdominal hysterectomy    . Tubal ligation  Social History:  reports that she has never smoked. She has never used smokeless tobacco. She reports that she does not drink alcohol or use illicit drugs.  Allergies  Allergen Reactions  . Ace Inhibitors Cough  . Penicillins Hives and Swelling  .  Sulfonamide Derivatives Swelling    Family History  Problem Relation Age of Onset  . Emphysema Brother     non smoker  . Heart disease Father   . Diabetes Mother     Prior to Admission medications   Medication Sig Start Date End Date Taking? Authorizing Provider  atorvastatin (LIPITOR) 20 MG tablet Take 20 mg by mouth daily.    Yes Historical Provider, MD  Calcium Carbonate-Vitamin D (CALCIUM + D PO) Take 1 tablet by mouth daily.    Yes Historical Provider, MD  carvedilol (COREG) 12.5 MG tablet Take 1 tablet (12.5 mg total) by mouth 2 (two) times daily. 07/16/12 07/16/13 Yes Vesta Mixer, MD  diphenoxylate-atropine (LOMOTIL) 2.5-0.025 MG per tablet Take 1 tablet by mouth daily as needed for diarrhea or loose stools.   Yes Historical Provider, MD  escitalopram (LEXAPRO) 10 MG tablet Take 10 mg by mouth daily as needed (mood.).    Yes Historical Provider, MD  furosemide (LASIX) 40 MG tablet Take 40 mg by mouth daily.    Yes Historical Provider, MD  HYDROcodone-acetaminophen (NORCO/VICODIN) 5-325 MG per tablet Take 1 tablet by mouth every 6 (six) hours as needed for pain.   Yes Historical Provider, MD  insulin aspart (NOVOLOG) 100 UNIT/ML injection Inject 30 Units into the skin 3 (three) times daily before meals.    Yes Historical Provider, MD  insulin glargine (LANTUS) 100 UNIT/ML injection Inject 45-50 Units into the skin See admin instructions. 50 units in the mornings and 45 units at night   Yes Historical Provider, MD  levothyroxine (SYNTHROID, LEVOTHROID) 125 MCG tablet Take 125 mcg by mouth daily.    Yes Historical Provider, MD  Montelukast Sodium (SINGULAIR PO) Take 10 mg by mouth every morning.    Yes Historical Provider, MD  Multiple Vitamin (MULTIVITAMIN WITH MINERALS) TABS Take 1 tablet by mouth daily.   Yes Historical Provider, MD  nitroGLYCERIN (NITROSTAT) 0.4 MG SL tablet Place 0.4 mg under the tongue every 5 (five) minutes as needed. Chest pain   Yes Historical Provider, MD   potassium chloride (KLOR-CON) 10 MEQ CR tablet Take 10 mEq by mouth 4 (four) times daily.    Yes Historical Provider, MD  promethazine (PHENERGAN) 25 MG tablet Take 25 mg by mouth every 4 (four) hours as needed for nausea.   Yes Historical Provider, MD  ranitidine (ZANTAC) 150 MG tablet Take 150 mg by mouth 2 (two) times daily.   Yes Historical Provider, MD  aspirin EC 81 MG tablet Take 81 mg by mouth daily.    Historical Provider, MD    Physical Exam: Filed Vitals:   12/12/12 0030 12/12/12 0100 12/12/12 0115 12/12/12 0130  BP: 123/44 100/79  111/41  Pulse: 80 82 81 81  Temp:      TempSrc:      Resp:      SpO2: 95% 96% 90% 96%    Physical Exam  Constitutional: Appears well-developed and well-nourished. No distress.  HENT: Normocephalic. External right and left ear normal. Oropharynx is clear and moist.  Eyes: Conjunctivae and EOM are normal. PERRLA, no scleral icterus.  Neck: Normal ROM. Neck supple. No JVD. No tracheal deviation. No thyromegaly.  CVS: RRR, S1/S2 +, no murmurs,  no gallops, no carotid bruit.  Pulmonary: Effort and breath sounds normal, no stridor, rhonchi, wheezes, rales.  Abdominal: Soft. BS +,  no distension, tenderness, rebound or guarding.  Musculoskeletal: Normal range of motion. No edema and no tenderness.  Lymphadenopathy: No lymphadenopathy noted, cervical, inguinal. Neuro: Alert. Normal reflexes, muscle tone coordination. No cranial nerve deficit. Skin: Skin is warm and dry. No rash noted. Not diaphoretic. No erythema. No pallor.  Psychiatric: Normal mood and affect. Behavior, judgment, thought content normal.   Labs on Admission:  Basic Metabolic Panel:  Recent Labs Lab 12/11/12 2103  NA 133*  K 4.1  CL 101  CO2 25  GLUCOSE 224*  BUN 45*  CREATININE 0.80  CALCIUM 8.8   Liver Function Tests:  Recent Labs Lab 12/11/12 2103  AST 27  ALT 26  ALKPHOS 177*  BILITOT 0.6  PROT 6.1  ALBUMIN 2.6*    Recent Labs Lab 12/11/12 2103  LIPASE  25   CBC:  Recent Labs Lab 12/11/12 2103  WBC 13.4*  NEUTROABS 10.5*  HGB 9.8*  HCT 29.9*  MCV 82.8  PLT 120*   Radiological Exams on Admission: Dg Chest 2 View  12/11/2012  *RADIOLOGY REPORT*  Clinical Data: Altered mental status.  Shortness of breath.  CHEST - 2 VIEW  Comparison: 06/02/2011  Findings: Mildly degraded exam due to AP portable technique and patient body habitus.  Apical lordotic patient positioning. Cardiomegaly accentuated by AP portable technique.  No right and no definite left pleural effusion. No pneumothorax.  Clear lungs.  IMPRESSION: No acute cardiopulmonary disease.   Original Report Authenticated By: Jeronimo Greaves, M.D.     EKG: Normal sinus rhythm, no ST/T wave changes  Debbora Presto, MD  [facility] Pager [phone]  If 7PM-7AM, please contact night-coverage [email or web address] Password [facility] 12/12/2012, 1:38 AM

## 2012-12-12 NOTE — Op Note (Signed)
[facility] [address] [place], [address]   ENDOSCOPY PROCEDURE REPORT  PATIENT: Madison, Hogan  MR#: [id number] BIRTHDATE: 07-09-1934 , 78  yrs. old GENDER: Female ENDOSCOPIST:Cam Harnden, MD REFERRED BY:  Hospitalist (pt of [facility]) PROCEDURE DATE:  12/12/2012 PROCEDURE:      upper endoscopy ASA CLASS: INDICATIONS:   history of hematemesis, limit stool, and drop in hemoglobin in a patient on low-dose baby aspirin MEDICATION:    Versed 3 mg IV TOPICAL ANESTHETIC:    Cetacaine spray  DESCRIPTION OF PROCEDURE:   the patient was brought in a fasted state from her hospital room to the endoscopy unit. Written consent had been provided. Was performed. The above sedation was administered without clinical instability.  The Pentax pediatric upper endoscope was used for this procedure so as to allow minimal sedation in this patient with sleep apnea. It was passed under direct vision. The vocal cords were not well seen. The esophagus was entered without difficulty.  The esophagus was endoscopically normal. No Mallory-Weiss tear, reflux esophagitis, Barrett's esophagus, varices, infection, neoplasia, ring, stricture, or significant hiatal hernia was observed.  The stomach was entered. It contained no blood or coffee-ground material. The prepyloric region had some edematous folds. Careful examination of this area, however, discloses no ulcers. The remainder of the stomach was unremarkable. No polyps, masses, gastritis, ulcers, erosions, or vascular ectasia were noted.  The pylorus and duodenal bulb and second duodenum were all normal, except for some Brunner's gland hypertrophy in the proximal duodenal bulb.  The scope was removed from the patient. No biopsies were obtained. She tolerated the procedure well and there were no apparent complications.     COMPLICATIONS: None  ENDOSCOPIC IMPRESSION:  1. Unremarkable exam 2. No blood in the  stomach or active bleeding at the time this procedure 3. No prospective or likely source of patient's hematemesis identified on this exam RECOMMENDATIONS:  1. Advance diet 2. Continue to monitor her clinically and with labs 3. Continue empiric antipeptic therapy 4.if the patient has further hematemesis, I would consider a repeat endoscopy. The differential diagnosis of upper GI bleeding with a negative endoscopy includes Dieulafoy ulcers, which can be evanescent in their appearance.    _______________________________ eSignedBernette Redbird, MD 12/12/2012 5:45 PM    PATIENT NAME:  Madison, Hogan MR#: [id number]

## 2012-12-12 NOTE — Progress Notes (Signed)
Pt on her way to Endo.  HGB noted at 7.0 text sent to Dr Janee Morn to inform him.

## 2012-12-12 NOTE — Progress Notes (Signed)
Endoscopy and sigmoidoscopy were well tolerated. Please see full dictated reports.  The endoscopy was normal. There was no evidence of portal hypertension or portal gastropathy or aspirin-induced gastropathy. The source of her GI bleeding remains unclear.  Her sigmoidoscopy was similarly normal, with no evidence of pseudomembranes despite her positive C. difficile toxin assay. Incidentally, the stool within the rectosigmoid lumen was dark, but formed, and did not really look melenic in character.  Recommendations:  1. I have ordered a diabetic diet 2. I have stopped the Protonix infusion, but I feel we should continue high-dose PPI therapy while she is in house. Upon discharge, I would recommend omeprazole 20 mg once daily, which can be purchased over-the-counter at a cost similar to the Zantac she is using currently, but with better efficacy in prophylaxing against aspirin-induced gastropathy and GI bleeding. 3. I would probably treat the C. difficile result with metronidazole for 2 weeks, although she does not show clinical evidence of the C. difficile active infection. 4. Discharge in a day or 2 may be appropriate if she remains clinically stable. Followup in our office, ideally with her previous gastroenterologist (Dr. Dorena Cookey) would be appropriate beginning a week or 2 after discharge. At that visit, I would recommend checking Hemoccult status, updating her hemoglobin, and deciding whether further GI evaluation such as colonoscopy is warranted (depending on outside records which the family indicates they will bring from her gastroenterologist in [place]). (Her medical care is with [facility] [place], and they would like to resume her GI care here in [place] as well.)  Florencia Reasons, M.D. [phone]

## 2012-12-12 NOTE — Progress Notes (Signed)
Shift event: Ms. Harper was admitted just a short while ago for chest pain to r/o ACS. Upon arrival on nsg unit, she vomited bright red emesis and had a black stool. Ms. Cheeks says she did take 4 baby ASA today because of the chest pain, but normally only takes 45m day. Denies frequent use of NSAIDS. Denies previous GIB. No ETOH use.  I spoke to Dr. MKatherina Right([facility]) who just admitted her and relayed above.  Plan: 1. Start Protonix gtt.          2. Occult stool and emesis to lab          3. No ASA or Lovenox.          4. Zofran and Phenergan if refractory n/v.           5. NS at 75/hr          6. SCDs for VTE PPx          7. NPO except ice chips          8. CBGs q4hrs while NPO          9. Hold insulin products while NPO         10. Recheck CBC at 0500.         11. VS q4hr and RN to call for tachycardia or hypotension         12. Call GI in am unless pt becomes clinically worse.  KClance Boll NP [facility]

## 2012-12-12 NOTE — Op Note (Signed)
[facility] [address] [place], [address]   FLEXIBLE SIGMOIDOSCOPY PROCEDURE REPORT  PATIENT: [name], [name]  MR#: [id number] BIRTHDATE: [birth year] , 78  yrs. old GENDER: Female ENDOSCOPIST: Bernette Redbird, MD REFERRED BY:  hospitalist (patient of [facility]) PROCEDURE DATE:  12/12/2012 PROCEDURE:     flexible sigmoidoscopy ASA CLASS: INDICATIONS: this patient was admitted to the hospital with nonspecific chest pain, and during the course of her evaluation, reported hematemesis and dark stools. Around this time a stool study came  back positive for C. difficile, even though the patient had no risk factors nor any watery diarrhea to go along with that diagnosis. This procedure is being done to look for evidence of pseudomembranes.  MEDICATIONS:    None (received Versed 3 mg IV for the upper endoscopy exam which immediately preceded this procedure)  DESCRIPTION OF PROCEDURE:  This procedure was performed at the [facility] long endoscopy unit immediately following her upper endoscopy.  The exam was intentionally done unprepped.  The Pentax pediatric video colonoscope was inserted and advanced easily to about 30 cm, whereupon pullback was initiated.  There was a moderate amount of formed dark brown (but not frankly melenic) stool scattered in the rectosigmoid lumen. No murmur or blood, clots, or red blood were seen.  The colonic mucosa was normal. Specifically, there was no evidence of any pseudomembranes.  Moreover, I did not see colonic ulcerations, colitis, vascular ectasia, polyps, or masses.  Retroflexion in the rectum was unremarkable.        COMPLICATIONS:  None  ENDOSCOPIC IMPRESSION:  1. Dark brown stool but not frank melena present 2. No active bleeding 3. No pseudomembranes identified to go along with her stool study result which was positive for C. difficile  RECOMMENDATIONS:  1. Continue to monitor stool appearance in  hemoglobin 2. I would probably treat for C. difficile for 2 weeks with metronidazole, even though the clinical suspicion of a true pathologic infection seems somewhat low   _______________________________ eSignedBernette Redbird, MD 12/12/2012 5:53 PM   PATIENT NAME:  [name] MR#: [id number]

## 2012-12-12 NOTE — Progress Notes (Signed)
*  PRELIMINARY RESULTS* Echocardiogram 2D Echocardiogram has been performed.  Jeryl Columbia 12/12/2012, 1:59 PM

## 2012-12-13 DIAGNOSIS — E876 Hypokalemia: Secondary | ICD-10-CM | POA: Diagnosis not present

## 2012-12-13 DIAGNOSIS — A0472 Enterocolitis due to Clostridium difficile, not specified as recurrent: Secondary | ICD-10-CM

## 2012-12-13 DIAGNOSIS — D62 Acute posthemorrhagic anemia: Secondary | ICD-10-CM

## 2012-12-13 DIAGNOSIS — I959 Hypotension, unspecified: Secondary | ICD-10-CM | POA: Diagnosis not present

## 2012-12-13 DIAGNOSIS — D696 Thrombocytopenia, unspecified: Secondary | ICD-10-CM | POA: Clinically undetermined

## 2012-12-13 LAB — BASIC METABOLIC PANEL
BUN: 25 mg/dL — ABNORMAL HIGH (ref 6–23)
CO2: 24 mEq/L (ref 19–32)
Calcium: 8.1 mg/dL — ABNORMAL LOW (ref 8.4–10.5)
Chloride: 110 mEq/L (ref 96–112)
Creatinine, Ser: 0.81 mg/dL (ref 0.50–1.10)
GFR calc Af Amer: 80 mL/min — ABNORMAL LOW (ref >90)
GFR calc non Af Amer: 69 mL/min — ABNORMAL LOW (ref >90)
Glucose, Bld: 114 mg/dL — ABNORMAL HIGH (ref 70–99)
Potassium: 3.3 mEq/L — ABNORMAL LOW (ref 3.5–5.1)
Sodium: 142 mEq/L (ref 135–145)

## 2012-12-13 LAB — TYPE AND SCREEN
ABO/RH(D): O POS
Antibody Screen: NEGATIVE
Unit division: 0
Unit division: 0

## 2012-12-13 LAB — GLUCOSE, CAPILLARY
Glucose-Capillary: 105 mg/dL — ABNORMAL HIGH (ref 70–99)
Glucose-Capillary: 118 mg/dL — ABNORMAL HIGH (ref 70–99)
Glucose-Capillary: 135 mg/dL — ABNORMAL HIGH (ref 70–99)
Glucose-Capillary: 171 mg/dL — ABNORMAL HIGH (ref 70–99)
Glucose-Capillary: 200 mg/dL — ABNORMAL HIGH (ref 70–99)
Glucose-Capillary: 201 mg/dL — ABNORMAL HIGH (ref 70–99)
Glucose-Capillary: 331 mg/dL — ABNORMAL HIGH (ref 70–99)
Glucose-Capillary: 55 mg/dL — ABNORMAL LOW (ref 70–99)
Glucose-Capillary: 76 mg/dL (ref 70–99)

## 2012-12-13 LAB — CBC
HCT: 26.3 % — ABNORMAL LOW (ref 36.0–46.0)
HCT: 26.2 % — ABNORMAL LOW (ref 36.0–46.0)
Hemoglobin: 8.8 g/dL — ABNORMAL LOW (ref 12.0–15.0)
Hemoglobin: 8.8 g/dL — ABNORMAL LOW (ref 12.0–15.0)
MCH: 28.1 pg (ref 26.0–34.0)
MCH: 28.6 pg (ref 26.0–34.0)
MCHC: 33.5 g/dL (ref 30.0–36.0)
MCHC: 33.6 g/dL (ref 30.0–36.0)
MCV: 84 fL (ref 78.0–100.0)
MCV: 85.1 fL (ref 78.0–100.0)
Platelets: 69 10*3/uL — ABNORMAL LOW (ref 150–400)
Platelets: 83 10*3/uL — ABNORMAL LOW (ref 150–400)
RBC: 3.13 MIL/uL — ABNORMAL LOW (ref 3.87–5.11)
RBC: 3.08 MIL/uL — ABNORMAL LOW (ref 3.87–5.11)
RDW: 15.8 % — ABNORMAL HIGH (ref 11.5–15.5)
RDW: 16.1 % — ABNORMAL HIGH (ref 11.5–15.5)
WBC: 7.4 10*3/uL (ref 4.0–10.5)
WBC: 7.9 10*3/uL (ref 4.0–10.5)

## 2012-12-13 LAB — URINE CULTURE: Colony Count: 1000

## 2012-12-13 LAB — CORTISOL: Cortisol, Plasma: 7.2 ug/dL

## 2012-12-13 LAB — LACTATE DEHYDROGENASE: LDH: 214 U/L (ref 94–250)

## 2012-12-13 LAB — HAPTOGLOBIN: Haptoglobin: 71 mg/dL (ref 45–215)

## 2012-12-13 MED ORDER — INSULIN GLARGINE 100 UNIT/ML ~~LOC~~ SOLN
25.0000 [IU] | Freq: Every day | SUBCUTANEOUS | Status: DC
  Administered 2012-12-14: 25 [IU] via SUBCUTANEOUS
  Filled 2012-12-13 (×2): qty 0.25

## 2012-12-13 MED ORDER — INSULIN GLARGINE 100 UNIT/ML ~~LOC~~ SOLN
25.0000 [IU] | Freq: Every day | SUBCUTANEOUS | Status: DC
  Administered 2012-12-13 – 2012-12-14 (×2): 25 [IU] via SUBCUTANEOUS
  Filled 2012-12-13 (×2): qty 0.25

## 2012-12-13 MED ORDER — SODIUM CHLORIDE 0.9 % IV BOLUS (SEPSIS)
250.0000 mL | Freq: Once | INTRAVENOUS | Status: AC
  Administered 2012-12-13: 250 mL via INTRAVENOUS

## 2012-12-13 MED ORDER — SODIUM CHLORIDE 0.9 % IV BOLUS (SEPSIS)
1000.0000 mL | Freq: Once | INTRAVENOUS | Status: AC
  Administered 2012-12-13: 1000 mL via INTRAVENOUS

## 2012-12-13 MED ORDER — POTASSIUM CHLORIDE CRYS ER 20 MEQ PO TBCR
40.0000 meq | EXTENDED_RELEASE_TABLET | Freq: Once | ORAL | Status: AC
  Administered 2012-12-13: 40 meq via ORAL
  Filled 2012-12-13: qty 2

## 2012-12-13 MED ORDER — INSULIN ASPART 100 UNIT/ML ~~LOC~~ SOLN
10.0000 [IU] | Freq: Three times a day (TID) | SUBCUTANEOUS | Status: DC
  Administered 2012-12-14: 10 [IU] via SUBCUTANEOUS

## 2012-12-13 NOTE — Progress Notes (Signed)
Patient ID: [name], female   DOB: October 10, 1933, 79 y.o.   MRN: [id number] [facility] Gastroenterology Progress Note  [name] 79 y.o. 15-May-1934   Subjective: Feels ok. Denies loose stools today. Denies abdominal pain. +N no vomiting. Tolerating diet.  Objective: Vital signs: Filed Vitals:   12/13/12 1802  BP: 115/40  Pulse: 74  Temp: 98.2 F (36.8 C)  Resp: 22    Physical Exam: Gen: alert, no acute distress, obese Abd: soft, NT, ND  Lab Results:  Recent Labs  12/12/12 0535 12/13/12 0555  NA 137 142  K 4.4 3.3*  CL 106 110  CO2 22 24  GLUCOSE 352* 114*  BUN 45* 25*  CREATININE 0.75 0.81  CALCIUM 8.8 8.1*    Recent Labs  12/11/12 2103  AST 27  ALT 26  ALKPHOS 177*  BILITOT 0.6  PROT 6.1  ALBUMIN 2.6*    Recent Labs  12/11/12 2103  12/13/12 0555 12/13/12 1654  WBC 13.4*  < > 7.4 7.9  NEUTROABS 10.5*  --   --   --   HGB 9.8*  < > 8.8* 8.8*  HCT 29.9*  < > 26.3* 26.2*  MCV 82.8  < > 84.0 85.1  PLT 120*  < > 69* 83*  < > = values in this interval not displayed.    Assessment/Plan: 79 yo with C. Diff and recent GI bleed with negative flex and EGD. No evidence of ongoing bleeding. Hgb 8.8. Continue to monitor and if stable maybe home in 1-2 days with f/u with Dr. [name] per Dr. Buccini's note yesterday. No new recs. Will sign off. Call if questions.   [place] C. 12/13/2012, 6:30 PM

## 2012-12-13 NOTE — Progress Notes (Signed)
Recheck cbg 76, given another snack of milk and peanut butter and crackers. Fluid bolus finished and BP recheck 110/52.

## 2012-12-13 NOTE — Progress Notes (Signed)
Pt cbg 55,md notified and states to hold novalog and lantus. Pt alert and awake no symptoms, given juice and peanut butter crackers. Has ordered supper.

## 2012-12-13 NOTE — Progress Notes (Signed)
[facility] PROGRESS NOTE  [name]:[id number] DOB: [birth year] DOA: 12/11/2012 PCP: [name], NP  Assessment/Plan: #1 porbable upper GI bleed 2 nights ago patient noted to have hematemesis and melanotic stools which were guaiac positive. Patient's hemoglobin trended downwards. S/p 2 units PRBCs. Patient s/p EGD and flex sig negative for source of bleeding. Continue PPI.  Patient also currently n.p.o. Continue serial CBCs every 12 hours. IVF. GI ff and appreciate input and rxcs.  #2 chest pain Questionable etiology. May be secondary to problem #1 as patient was noted to have hematemesis and melanotic stools. Patient also however with a significant cardiac history. Cardiac enzymes negative x3. 2-D echo with EF 50-55% with severe hypokinesis of entire inferior myocardium which was on prior 2 D Echo. Hold cardiac medications secondary to borderline hypotension. Continue to hold diuretics secondary to problem #1.  #3. Hypotension ?? Etiology. Maybe likely hypovolemia. Cardiac enzymes neg x 3. 2 d echo with EF 50-55% with hypokinesis of inferior wall unchanged. Check cortisol level. Repeat EKG. Patient afebrile with nl WBC. Hold BP meds. IVF. Follow.  #4 C. difficile colitis Patient noted to be C. difficile positive. Patient denies any significant diarrhea.. Continue Flagyl.  #5 diabetes mellitus Continue home dose Lantus. Sliding scale insulin.  #6 acute blood loss anemia Likely secondary to hematemesis and melanotic stools. Patient with no further bleeding. Patient's hemoglobin dropped to 7 yesterday and patient was transfused 2 units packed red blood cells. Current hemoglobin today is 8.8.  #7 thrombocytopenia Questionable etiology. Patient with a prior history of thrombocytopenia. Check LDH. Check a haptoglobin. Check a peripheral smear. Patient is not on Lovenox. Follow.  #8 hypothyroidism Continue home dose Synthroid.  #9 leukocytosis May be secondary to C.  difficile colitis. Urinalysis is negative. White count has trended down. Continue Flagyl.  #10 Hypokalemia Replete.  #11 prophylaxis PPI for GI prophylaxis. SCDs for DVT prophylaxis.   Code Status: Full Family Communication: Updated patient no family at bedside Disposition Plan: Home when medically stable   Consultants:  Gastroenterology: Dr. [name] 12/12/2012  Procedures:  EGD 12/12/12  Flex sig 12/12/12  2 units PRBCs  Antibiotics:  Flagyl 12/12/12  HPI/Subjective: Patient with no further episodes of hematemesis or melanotic stools. Patient noted to have hypotension overnight. Patient states chest pain somewhat improved from admission.  Objective: Filed Vitals:   12/13/12 0014 12/13/12 0114 12/13/12 0416 12/13/12 0925  BP: 102/45 89/31 91/37  102/48  Pulse: 66 67 70   Temp: 98 F (36.7 C) 98 F (36.7 C) 98.7 F (37.1 C)   TempSrc: Oral Oral Oral   Resp: 20 14 18    Height:      Weight:   88.8 kg (195 lb 12.3 oz)   SpO2:   100%     Intake/Output Summary (Last 24 hours) at 12/13/12 0941 Last data filed at 12/13/12 0030  Gross per 24 hour  Intake 2647.5 ml  Output   2000 ml  Net  647.5 ml   Filed Weights   12/12/12 0230 12/12/12 0500 12/13/12 0416  Weight: 88.7 kg (195 lb 8.8 oz) 89 kg (196 lb 3.4 oz) 88.8 kg (195 lb 12.3 oz)    Exam:   General:  NAD  Cardiovascular: RRR  Respiratory: CTAB  Abdomen: Soft, some tenderness to palpation in the upper abdomen diffuse, nondistended, positive bowel sounds.  Musculoskeletal: 5/5 bue STRENGTH, 5/5 BLE strength  Data Reviewed: Basic Metabolic Panel:  Recent Labs Lab 12/11/12 2103 12/12/12 0535 12/13/12 0555  NA  133* 137 142  K 4.1 4.4 3.3*  CL 101 106 110  CO2 25 22 24   GLUCOSE 224* 352* 114*  BUN 45* 45* 25*  CREATININE 0.80 0.75 0.81  CALCIUM 8.8 8.8 8.1*   Liver Function Tests:  Recent Labs Lab 12/11/12 2103  AST 27  ALT 26  ALKPHOS 177*  BILITOT 0.6  PROT 6.1  ALBUMIN 2.6*     Recent Labs Lab 12/11/12 2103  LIPASE 25   No results found for this basename: AMMONIA,  in the last 168 hours CBC:  Recent Labs Lab 12/11/12 2103 12/12/12 0535 12/12/12 1304 12/13/12 0555  WBC 13.4* 12.5* 9.7 7.4  NEUTROABS 10.5*  --   --   --   HGB 9.8* 8.6* 7.0* 8.8*  HCT 29.9* 26.4* 21.4* 26.3*  MCV 82.8 83.3 83.3 84.0  PLT 120* 136* 96* 69*   Cardiac Enzymes:  Recent Labs Lab 12/12/12 0130 12/12/12 0700 12/12/12 1304  TROPONINI <0.30 <0.30 <0.30   BNP (last 3 results)  Recent Labs  11/25/12 1534 12/12/12 0100  PROBNP 90.0 80.2   CBG:  Recent Labs Lab 12/12/12 1159 12/12/12 1823 12/12/12 2055 12/13/12 0014 12/13/12 0414  GLUCAP 311* 240* 331* 201* 118*    Recent Results (from the past 240 hour(s))  CLOSTRIDIUM DIFFICILE BY PCR     Status: Abnormal   Collection Time    12/12/12  3:36 AM      Result Value Range Status   C difficile by pcr POSITIVE (*) NEGATIVE Final   Comment: CRITICAL RESULT CALLED TO, READ BACK BY AND VERIFIED WITH:     T. SCRONCE 9:45 12/12/12 ([email or web address])     Studies: Dg Chest 2 View  12/11/2012  *RADIOLOGY REPORT*  Clinical Data: Altered mental status.  Shortness of breath.  CHEST - 2 VIEW  Comparison: 06/02/2011  Findings: Mildly degraded exam due to AP portable technique and patient body habitus.  Apical lordotic patient positioning. Cardiomegaly accentuated by AP portable technique.  No right and no definite left pleural effusion. No pneumothorax.  Clear lungs.  IMPRESSION: No acute cardiopulmonary disease.   Original Report Authenticated By: Abigail Miyamoto, M.D.     Scheduled Meds: . atorvastatin  20 mg Oral Daily  . carvedilol  12.5 mg Oral BID  . insulin aspart  0-9 Units Subcutaneous Q4H  . insulin aspart  30 Units Subcutaneous TID AC  . insulin glargine  45 Units Subcutaneous QHS  . insulin glargine  50 Units Subcutaneous Daily  . levothyroxine  125 mcg Oral Daily  . metroNIDAZOLE  500 mg Oral Q8H  . montelukast   5 mg Oral QHS  . pantoprazole  40 mg Oral BID AC  . sodium chloride  3 mL Intravenous Q12H   Continuous Infusions: . sodium chloride 75 mL/hr at 12/12/12 2112    Principal Problem:   Chest pain, unspecified Active Problems:   Heart failure, diastolic, chronic   COPD (chronic obstructive pulmonary disease)   Clostridium difficile colitis   GIB (gastrointestinal bleeding)   Acute blood loss anemia   Hypotension, unspecified    Time spent: > 35 mins    [facility] Pager [phone]. If 7PM-7AM, please contact night-coverage at [email or web address], password [email or web address] 12/13/2012, 9:41 AM  LOS: 2 days

## 2012-12-14 ENCOUNTER — Inpatient Hospital Stay (HOSPITAL_COMMUNITY): Payer: Medicare Other

## 2012-12-14 DIAGNOSIS — I2510 Atherosclerotic heart disease of native coronary artery without angina pectoris: Secondary | ICD-10-CM

## 2012-12-14 DIAGNOSIS — K922 Gastrointestinal hemorrhage, unspecified: Secondary | ICD-10-CM

## 2012-12-14 DIAGNOSIS — R9430 Abnormal result of cardiovascular function study, unspecified: Secondary | ICD-10-CM

## 2012-12-14 DIAGNOSIS — I472 Ventricular tachycardia: Secondary | ICD-10-CM

## 2012-12-14 LAB — GLUCOSE, CAPILLARY
Glucose-Capillary: 153 mg/dL — ABNORMAL HIGH (ref 70–99)
Glucose-Capillary: 167 mg/dL — ABNORMAL HIGH (ref 70–99)
Glucose-Capillary: 183 mg/dL — ABNORMAL HIGH (ref 70–99)
Glucose-Capillary: 336 mg/dL — ABNORMAL HIGH (ref 70–99)
Glucose-Capillary: 301 mg/dL — ABNORMAL HIGH (ref 70–99)

## 2012-12-14 LAB — MRSA PCR SCREENING: MRSA by PCR: NEGATIVE

## 2012-12-14 LAB — BASIC METABOLIC PANEL
BUN: 14 mg/dL (ref 6–23)
CO2: 23 mEq/L (ref 19–32)
Calcium: 7.8 mg/dL — ABNORMAL LOW (ref 8.4–10.5)
Chloride: 110 mEq/L (ref 96–112)
Creatinine, Ser: 0.78 mg/dL (ref 0.50–1.10)
GFR calc Af Amer: >90 mL/min (ref >90)
GFR calc non Af Amer: 79 mL/min — ABNORMAL LOW (ref >90)
Glucose, Bld: 175 mg/dL — ABNORMAL HIGH (ref 70–99)
Potassium: 3.6 mEq/L (ref 3.5–5.1)
Sodium: 138 mEq/L (ref 135–145)

## 2012-12-14 LAB — CBC
HCT: 25.9 % — ABNORMAL LOW (ref 36.0–46.0)
Hemoglobin: 8.6 g/dL — ABNORMAL LOW (ref 12.0–15.0)
MCH: 28.4 pg (ref 26.0–34.0)
MCHC: 33.2 g/dL (ref 30.0–36.0)
MCV: 85.5 fL (ref 78.0–100.0)
Platelets: 69 10*3/uL — ABNORMAL LOW (ref 150–400)
RBC: 3.03 MIL/uL — ABNORMAL LOW (ref 3.87–5.11)
RDW: 16.4 % — ABNORMAL HIGH (ref 11.5–15.5)
WBC: 5.8 10*3/uL (ref 4.0–10.5)

## 2012-12-14 LAB — MAGNESIUM: Magnesium: 1.9 mg/dL (ref 1.5–2.5)

## 2012-12-14 MED ORDER — INSULIN ASPART 100 UNIT/ML ~~LOC~~ SOLN
0.0000 [IU] | Freq: Three times a day (TID) | SUBCUTANEOUS | Status: DC
  Administered 2012-12-14 – 2012-12-15 (×3): 7 [IU] via SUBCUTANEOUS
  Administered 2012-12-15: 5 [IU] via SUBCUTANEOUS
  Administered 2012-12-15: 3 [IU] via SUBCUTANEOUS
  Administered 2012-12-15: 7 [IU] via SUBCUTANEOUS
  Administered 2012-12-16 (×2): 3 [IU] via SUBCUTANEOUS

## 2012-12-14 MED ORDER — POTASSIUM CHLORIDE CRYS ER 20 MEQ PO TBCR
40.0000 meq | EXTENDED_RELEASE_TABLET | Freq: Once | ORAL | Status: AC
  Administered 2012-12-14: 40 meq via ORAL
  Filled 2012-12-14: qty 2

## 2012-12-14 MED ORDER — IPRATROPIUM BROMIDE 0.02 % IN SOLN
0.5000 mg | Freq: Four times a day (QID) | RESPIRATORY_TRACT | Status: DC
  Administered 2012-12-14 – 2012-12-16 (×9): 0.5 mg via RESPIRATORY_TRACT
  Filled 2012-12-14 (×7): qty 2.5

## 2012-12-14 MED ORDER — ALBUTEROL SULFATE (5 MG/ML) 0.5% IN NEBU: 2.5000 mg | INHALATION_SOLUTION | RESPIRATORY_TRACT | Status: DC | PRN

## 2012-12-14 MED ORDER — MAGNESIUM SULFATE IN D5W 10-5 MG/ML-% IV SOLN: 1.0000 g | Freq: Once | INTRAVENOUS | Status: DC

## 2012-12-14 MED ORDER — IPRATROPIUM BROMIDE 0.02 % IN SOLN
0.5000 mg | RESPIRATORY_TRACT | Status: DC | PRN
  Filled 2012-12-14 (×2): qty 2.5

## 2012-12-14 MED ORDER — METHYLPREDNISOLONE SODIUM SUCC 125 MG IJ SOLR
60.0000 mg | Freq: Once | INTRAMUSCULAR | Status: AC
  Administered 2012-12-14: 60 mg via INTRAVENOUS
  Filled 2012-12-14: qty 0.96

## 2012-12-14 MED ORDER — ALBUTEROL SULFATE (5 MG/ML) 0.5% IN NEBU
2.5000 mg | INHALATION_SOLUTION | Freq: Four times a day (QID) | RESPIRATORY_TRACT | Status: DC
  Administered 2012-12-14 – 2012-12-16 (×9): 2.5 mg via RESPIRATORY_TRACT
  Filled 2012-12-14 (×9): qty 0.5

## 2012-12-14 MED ORDER — MAGNESIUM SULFATE 40 MG/ML IJ SOLN
2.0000 g | Freq: Once | INTRAMUSCULAR | Status: AC
  Administered 2012-12-14: 2 g via INTRAVENOUS
  Filled 2012-12-14: qty 50

## 2012-12-14 MED ORDER — ZOLPIDEM TARTRATE 5 MG PO TABS
5.0000 mg | ORAL_TABLET | Freq: Every evening | ORAL | Status: DC | PRN
  Administered 2012-12-14 (×2): 5 mg via ORAL
  Filled 2012-12-14 (×3): qty 1

## 2012-12-14 NOTE — Progress Notes (Signed)
MD at bedside and states he is transferring pt to step-down due to torsades this am. Pt has Mg ordered and infusing. Pt is alert and oriented x4, states she feels wheezy and mildly SOB, RT here to give breathing treatment. CXR done port. Will monitor closely.

## 2012-12-14 NOTE — Consult Note (Signed)
ELECTROPHYSIOLOGY CONSULT NOTE     Primary Care Physician: [name], NP Referring Physician:  Dr Grandville Silos  Admit Date: 12/11/2012  Reason for consultation:  ? arrhythmia  [name] is a 79 y.o. female with a h/o known CAD now admitted with upper Gi bleeding and atypical chest pain.  She has a workup ongoing as directed by GI.  She reports that she has had recent postural dizziness and presyncope.  She denies any palpitations, dizziness, presyncope/ syncope except with position.  She is quite anemic at this time. Today, she was observed to have a worrisome wide complex rhythm on telemetry.  She is transferred to the unit and cardiology is consulted.   Past Medical History  Diagnosis Date  . Coronary artery disease     status post inferior wall myocardial infarction  . Diabetes mellitus   . Obesity   . Leg edema   . Sleep apnea   . Hypothyroidism   . Dyslipidemia   . Hypotension 08/02/2010    recent episodes of orthostatic hypotension  . Neuropathy of hand     left hand numbness/neuropathy  . Cirrhosis, non-alcoholic    Past Surgical History  Procedure Laterality Date  . Cardiac catheterization      The ejection fraction is around 50%  . Coronary stent placement    . Cholecystectomy    . Abdominal hysterectomy    . Tubal ligation      . albuterol  2.5 mg Nebulization Q6H  . atorvastatin  20 mg Oral Daily  . insulin aspart  0-9 Units Subcutaneous Q4H  . insulin aspart  10 Units Subcutaneous TID AC  . insulin glargine  25 Units Subcutaneous Daily  . insulin glargine  25 Units Subcutaneous QHS  . ipratropium  0.5 mg Nebulization Q6H  . levothyroxine  125 mcg Oral Daily  . metroNIDAZOLE  500 mg Oral Q8H  . montelukast  5 mg Oral QHS  . pantoprazole  40 mg Oral BID AC  . sodium chloride  3 mL Intravenous Q12H      Allergies  Allergen Reactions  . Ace Inhibitors Cough  . Penicillins Hives and Swelling  . Sulfonamide Derivatives Swelling    History    Social History  . Marital Status: Married    Spouse Name: N/A    Number of Children: N/A  . Years of Education: N/A   Occupational History  . Not on file.   Social History Main Topics  . Smoking status: Never Smoker   . Smokeless tobacco: Never Used  . Alcohol Use: No  . Drug Use: No  . Sexually Active: Not on file   Other Topics Concern  . Not on file   Social History Narrative  . No narrative on file    Family History  Problem Relation Age of Onset  . Emphysema Brother     non smoker  . Heart disease Father   . Diabetes Mother     Physical Exam: Telemetry: sinus rhythm, the rhythm of concern was actually artifact Filed Vitals:   12/13/12 2350 12/14/12 0422 12/14/12 0641 12/14/12 0929  BP: 120/44 104/41 120/54 116/48  Pulse: 74 73 72 68  Temp: 98.2 F (36.8 C) 98.2 F (36.8 C) 98.2 F (36.8 C)   TempSrc: Oral Oral Oral   Resp: 19 19 18    Height:      Weight:  199 lb 8.3 oz (90.5 kg)    SpO2: 99% 99% 100% 100%    GEN-  The patient is overweight appearing, alert and oriented x 3 today.   Head- normocephalic, atraumatic Eyes-  Sclera clear, conjunctiva pale Ears- hearing intact Oropharynx- clear Neck- supple, no JVP Lungs- Clear to ausculation bilaterally, normal work of breathing Heart- Regular rate and rhythm  GI- soft, NT, ND, + BS Extremities- no clubbing, cyanosis, or edema  EKG reveals sinus rhythm 70 bpm, Qtc 477, rsR', inferior infarction Echo reviewed, myoview from 2012 is also reviewed  Labs:   Lab Results  Component Value Date   WBC 5.8 12/14/2012   HGB 8.6* 12/14/2012   HCT 25.9* 12/14/2012   MCV 85.5 12/14/2012   PLT 69* 12/14/2012    Recent Labs Lab 12/11/12 2103  12/14/12 0522  NA 133*  < > 138  K 4.1  < > 3.6  CL 101  < > 110  CO2 25  < > 23  BUN 45*  < > 14  CREATININE 0.80  < > 0.78  CALCIUM 8.8  < > 7.8*  PROT 6.1  --   --   BILITOT 0.6  --   --   ALKPHOS 177*  --   --   ALT 26  --   --   AST 27  --   --   GLUCOSE  224*  < > 175*  < > = values in this interval not displayed. Lab Results  Component Value Date   CKTOTAL 71 11/25/2012   CKMB 1.9 01/30/2012   TROPONINI <0.30 12/12/2012    Lab Results  Component Value Date   CHOL 147 12/20/2011   CHOL 138 06/03/2011   Lab Results  Component Value Date   HDL 61.90 12/20/2011   HDL 43 06/03/2011   Lab Results  Component Value Date   LDLCALC 58 12/20/2011   LDLCALC 54 06/03/2011   Lab Results  Component Value Date   TRIG 136.0 12/20/2011   TRIG 206* 06/03/2011   Lab Results  Component Value Date   CHOLHDL 2 12/20/2011   CHOLHDL 3.2 06/03/2011   No results found for this basename: LDLDIRECT      ASSESSMENT AND PLAN:   1. Abnormal telemetry strip I have reviewed the strip as well as full disclosure and feel that this is artifact rather than a ventricular arrhythmia.  Would avoid Qt prolonging drugs when able and keep electrolytes replete.  No further workup or testing is required.  2. CAD I think that active CAD would be an unlikely cause for her current chest pain which is almost certainly GI in etiology.  Outpatient stress testing would be reasonable follow-up. Resume home medicines  3. Orthostatic dizziness Likely due to anemia Primary team/ GI to continue to manage anemia She appears slightly dry today.  I agree with holding lasix at this time.   Thompson Grayer, MD 12/14/2012  11:43 AM

## 2012-12-14 NOTE — Progress Notes (Signed)
Report to ICU nurse, transfer, pt remains asystematic.

## 2012-12-14 NOTE — Progress Notes (Signed)
RN answered blue emergency phone CCM states pt in vtach/vfib. RN to room, pt aroused from sleep and was alert and oriented, pt stated she felt fine. On call MD text paged. Vitals WNL, pt stable. Will continue to monitor.

## 2012-12-14 NOTE — Progress Notes (Signed)
[facility] PROGRESS NOTE  [name] WUX:[id number] DOB: 1934-06-27 DOA: 12/11/2012 PCP: [name], NP  Assessment/Plan: #1 Torsades de ponites Patient with noted rythym of torsades this morning. Patient asymptomatic. Patient's blood pressure currently stable and hypotension has improved from yesterday. Potassium today is 3.6. Magnesium today is 1.9. Will give 2 g of IV magnesium. Will give 40 mEq of oral potassium x1. Will saline lock IV fluids. Monitor fluid status. Coreg and cardiac medications were held secondary to hypotension yesterday. We'll transfer to the ICU. Will consult with cardiology for further evaluation and management.  #2 porbable upper GI bleed 3 nights ago patient noted to have hematemesis and melanotic stools which were guaiac positive. Patient's hemoglobin trended downwards. S/p 2 units PRBCs. Patient s/p EGD and flex sig negative for source of bleeding. Continue PPI.  Hemoglobin stable at 8.6. No further ongoing bleeding noted. Normal saline lock IV fluids. Patient tolerating current diet. GI following.  #3 chest pain Questionable etiology. May be secondary to problem #2 as patient was noted to have hematemesis and melanotic stools. Patient also however with a significant cardiac history. Cardiac enzymes negative x3. 2-D echo with EF 50-55% with severe hypokinesis of entire inferior myocardium which was on prior 2 D Echo. Held cardiac medications secondary to borderline hypotension. Continue to hold diuretics and beta blocker. Follow.   #4. Hypotension ?? Etiology. Maybe likely hypovolemia. Cardiac enzymes neg x 3. 2 d echo with EF 50-55% with hypokinesis of inferior wall unchanged. Blood pressure has responded to IV fluids. Normal saline lock IV fluids. Random cortisol level was 7.2 this morning. Patient afebrile with nl WBC. Continue to hold BP meds. Normal saline lock IVF. Follow.  #5 C. difficile colitis Patient noted to be C. difficile positive. Patient  denies any significant diarrhea.. Continue Flagyl.  #6 diabetes mellitus Hemoglobin A1c 7.9. CBGs have ranged from 114 -200. Patient with episode of CBG of 55 and asymptomatic yesterday and a such a Lantus dose has been decreased.  Sliding scale insulin.  #7 acute blood loss anemia Likely secondary to hematemesis and melanotic stools. Patient with no further bleeding. Patient's hemoglobin dropped to 7 2 days ago, and patient was transfused 2 units packed red blood cells. Current hemoglobin today is 8.6 and stable.  #8 thrombocytopenia Questionable etiology. Patient with a prior history of thrombocytopenia. Haptoglobin is 71. LDH is 214. No signs of bleeding. Peripheral smear pending. Patient is not on Lovenox. Follow.  #9 hypothyroidism TSH is 0.521. Continue home dose Synthroid.  #10 leukocytosis May be secondary to C. difficile colitis. Urinalysis is negative. White count has trended down. Continue Flagyl.  #11 Hypokalemia Replete. Keep potassium at 4.  #12 COPD  patient with minimal wheezing. Will place on scheduled meds. Will give a one-time dose of Solu-Medrol 60 mg IV x1. Check a chest x-ray. Normal saline lock IV fluids. Follow.  #13 prophylaxis PPI for GI prophylaxis. SCDs for DVT prophylaxis.   Code Status: Full Family Communication: Updated patient no family at bedside Disposition Plan: Transfer to ICU. Home when medically stable   Consultants:  Gastroenterology: Dr. [name] 12/12/2012  Procedures:  EGD 12/12/12  Flex sig 12/12/12  2 units PRBCs  2 D Echo 12/12/12  Antibiotics:  Flagyl 12/12/12  HPI/Subjective: Patient with no further episodes of hematemesis or melanotic stools. Patient noted to have hypotension yesterday responded to fluids.  Patient denies any chest pain. Patient stated had nausea last night since resolved. Patient noted to have torsades early this morning  at 631 am.  Objective: Filed Vitals:   12/13/12 2013 12/13/12 2350 12/14/12 0422  12/14/12 0641  BP: 128/54 120/44 104/41 120/54  Pulse: 75 74 73 72  Temp: 98.1 F (36.7 C) 98.2 F (36.8 C) 98.2 F (36.8 C) 98.2 F (36.8 C)  TempSrc: Oral Oral Oral Oral  Resp: 19 19 19 18   Height:      Weight:   90.5 kg (199 lb 8.3 oz)   SpO2: 97% 99% 99% 100%    Intake/Output Summary (Last 24 hours) at 12/14/12 0923 Last data filed at 12/14/12 0758  Gross per 24 hour  Intake 3973.75 ml  Output   1102 ml  Net 2871.75 ml   Filed Weights   12/12/12 0500 12/13/12 0416 12/14/12 0422  Weight: 89 kg (196 lb 3.4 oz) 88.8 kg (195 lb 12.3 oz) 90.5 kg (199 lb 8.3 oz)    Exam:   General:  NAD  Cardiovascular: RRR  Respiratory: Minimal expiratory wheezing.  Abdomen: Soft, some tenderness to palpation in the upper abdomen diffuse, mild distension, positive bowel sounds.  Musculoskeletal: 5/5 bue STRENGTH, 5/5 BLE strength  Data Reviewed: Basic Metabolic Panel:  Recent Labs Lab 12/11/12 2103 12/12/12 0535 12/13/12 0555 12/14/12 0522  NA 133* 137 142 138  K 4.1 4.4 3.3* 3.6  CL 101 106 110 110  CO2 25 22 24 23   GLUCOSE 224* 352* 114* 175*  BUN 45* 45* 25* 14  CREATININE 0.80 0.75 0.81 0.78  CALCIUM 8.8 8.8 8.1* 7.8*  MG  --   --   --  1.9   Liver Function Tests:  Recent Labs Lab 12/11/12 2103  AST 27  ALT 26  ALKPHOS 177*  BILITOT 0.6  PROT 6.1  ALBUMIN 2.6*    Recent Labs Lab 12/11/12 2103  LIPASE 25   No results found for this basename: AMMONIA,  in the last 168 hours CBC:  Recent Labs Lab 12/11/12 2103 12/12/12 0535 12/12/12 1304 12/13/12 0555 12/13/12 1654 12/14/12 0522  WBC 13.4* 12.5* 9.7 7.4 7.9 5.8  NEUTROABS 10.5*  --   --   --   --   --   HGB 9.8* 8.6* 7.0* 8.8* 8.8* 8.6*  HCT 29.9* 26.4* 21.4* 26.3* 26.2* 25.9*  MCV 82.8 83.3 83.3 84.0 85.1 85.5  PLT 120* 136* 96* 69* 83* 69*   Cardiac Enzymes:  Recent Labs Lab 12/12/12 0130 12/12/12 0700 12/12/12 1304  TROPONINI <0.30 <0.30 <0.30   BNP (last 3 results)  Recent  Labs  11/25/12 1534 12/12/12 0100  PROBNP 90.0 80.2   CBG:  Recent Labs Lab 12/13/12 1631 12/13/12 1748 12/13/12 2016 12/13/12 2347 12/14/12 0420  GLUCAP 76 105* 153* 200* 167*    Recent Results (from the past 240 hour(s))  CLOSTRIDIUM DIFFICILE BY PCR     Status: Abnormal   Collection Time    12/12/12  3:36 AM      Result Value Range Status   C difficile by pcr POSITIVE (*) NEGATIVE Final   Comment: CRITICAL RESULT CALLED TO, READ BACK BY AND VERIFIED WITH:     T. SCRONCE 9:45 12/12/12 ([email or web address])  URINE CULTURE     Status: None   Collection Time    12/12/12  9:20 AM      Result Value Range Status   Specimen Description URINE, CLEAN CATCH   Final   Special Requests NONE   Final   Culture  Setup Time 12/12/2012 15:54   Final   Colony Count 1,000 COLONIES/ML  Final   Culture INSIGNIFICANT GROWTH   Final   Report Status 12/13/2012 FINAL   Final     Studies: No results found.  Scheduled Meds: . albuterol  2.5 mg Nebulization Q6H  . atorvastatin  20 mg Oral Daily  . insulin aspart  0-9 Units Subcutaneous Q4H  . insulin aspart  10 Units Subcutaneous TID AC  . insulin glargine  25 Units Subcutaneous Daily  . insulin glargine  25 Units Subcutaneous QHS  . ipratropium  0.5 mg Nebulization Q6H  . levothyroxine  125 mcg Oral Daily  . magnesium sulfate 1 - 4 g bolus IVPB  2 g Intravenous Once  . methylPREDNISolone (SOLU-MEDROL) injection  60 mg Intravenous Once  . metroNIDAZOLE  500 mg Oral Q8H  . montelukast  5 mg Oral QHS  . pantoprazole  40 mg Oral BID AC  . potassium chloride  40 mEq Oral Once  . sodium chloride  3 mL Intravenous Q12H   Continuous Infusions:    Principal Problem:   Chest pain, unspecified Active Problems:   Heart failure, diastolic, chronic   COPD (chronic obstructive pulmonary disease)   Clostridium difficile colitis   GIB (gastrointestinal bleeding)   Acute blood loss anemia   Hypotension, unspecified   Hypokalemia   Thrombocytopenia,  unspecified   Torsades de pointes    Time spent: > 35 mins    [facility] Pager [phone]. If 7PM-7AM, please contact night-coverage at [email or web address], password [facility] 12/14/2012, 9:23 AM  LOS: 3 days

## 2012-12-15 ENCOUNTER — Encounter (HOSPITAL_COMMUNITY): Payer: Self-pay | Admitting: Gastroenterology

## 2012-12-15 LAB — CBC
HCT: 26.6 % — ABNORMAL LOW (ref 36.0–46.0)
Hemoglobin: 8.8 g/dL — ABNORMAL LOW (ref 12.0–15.0)
MCH: 28.1 pg (ref 26.0–34.0)
MCHC: 33.1 g/dL (ref 30.0–36.0)
MCV: 85 fL (ref 78.0–100.0)
Platelets: 74 10*3/uL — ABNORMAL LOW (ref 150–400)
RBC: 3.13 MIL/uL — ABNORMAL LOW (ref 3.87–5.11)
RDW: 16.3 % — ABNORMAL HIGH (ref 11.5–15.5)
WBC: 6.7 10*3/uL (ref 4.0–10.5)

## 2012-12-15 LAB — BASIC METABOLIC PANEL
BUN: 13 mg/dL (ref 6–23)
CO2: 24 mEq/L (ref 19–32)
Calcium: 8.3 mg/dL — ABNORMAL LOW (ref 8.4–10.5)
Chloride: 103 mEq/L (ref 96–112)
Creatinine, Ser: 0.71 mg/dL (ref 0.50–1.10)
GFR calc Af Amer: >90 mL/min (ref >90)
GFR calc non Af Amer: 82 mL/min — ABNORMAL LOW (ref >90)
Glucose, Bld: 323 mg/dL — ABNORMAL HIGH (ref 70–99)
Potassium: 4.3 mEq/L (ref 3.5–5.1)
Sodium: 134 mEq/L — ABNORMAL LOW (ref 135–145)

## 2012-12-15 LAB — GLUCOSE, CAPILLARY
Glucose-Capillary: 261 mg/dL — ABNORMAL HIGH (ref 70–99)
Glucose-Capillary: 238 mg/dL — ABNORMAL HIGH (ref 70–99)
Glucose-Capillary: 348 mg/dL — ABNORMAL HIGH (ref 70–99)
Glucose-Capillary: 330 mg/dL — ABNORMAL HIGH (ref 70–99)

## 2012-12-15 LAB — MAGNESIUM: Magnesium: 2.2 mg/dL (ref 1.5–2.5)

## 2012-12-15 MED ORDER — INSULIN GLARGINE 100 UNIT/ML ~~LOC~~ SOLN
40.0000 [IU] | Freq: Every day | SUBCUTANEOUS | Status: DC
  Administered 2012-12-15: 40 [IU] via SUBCUTANEOUS
  Filled 2012-12-15 (×2): qty 0.4

## 2012-12-15 MED ORDER — INSULIN GLARGINE 100 UNIT/ML ~~LOC~~ SOLN
40.0000 [IU] | Freq: Every day | SUBCUTANEOUS | Status: DC
  Administered 2012-12-15 – 2012-12-16 (×2): 40 [IU] via SUBCUTANEOUS
  Filled 2012-12-15 (×2): qty 0.4

## 2012-12-15 NOTE — Progress Notes (Signed)
   Subjective:  Denies CP or dyspnea   Objective:  Filed Vitals:   12/15/12 0221 12/15/12 0400 12/15/12 0403 12/15/12 0500  BP:   122/40   Pulse:   80   Temp:  98.8 F (37.1 C) 98.8 F (37.1 C)   TempSrc:  Oral    Resp:   23   Height:      Weight:    203 lb 14.8 oz (92.5 kg)  SpO2: 100%  100%     Intake/Output from previous day:  Intake/Output Summary (Last 24 hours) at 12/15/12 0724 Last data filed at 12/15/12 0500  Gross per 24 hour  Intake 2368.33 ml  Output   2900 ml  Net -531.67 ml    Physical Exam: Physical exam: Well-developed well-nourished in no acute distress.  Skin is warm and dry.  HEENT is normal.  Neck is supple.  Chest is clear to auscultation with normal expansion.  Cardiovascular exam is regular rate and rhythm.  Abdominal exam nontender or distended. No masses palpated. Extremities show no edema. neuro grossly intact    Lab Results: Basic Metabolic Panel:  Recent Labs  12/14/12 0522 12/15/12 0340  NA 138 134*  K 3.6 4.3  CL 110 103  CO2 23 24  GLUCOSE 175* 323*  BUN 14 13  CREATININE 0.78 0.71  CALCIUM 7.8* 8.3*  MG 1.9 2.2   CBC:  Recent Labs  12/14/12 0522 12/15/12 0340  WBC 5.8 6.7  HGB 8.6* 8.8*  HCT 25.9* 26.6*  MCV 85.5 85.0  PLT 69* 74*   Cardiac Enzymes:  Recent Labs  12/12/12 1304  TROPONINI <0.30     Assessment/Plan:  1 chest pain-enzymes negative. Patient with GI bleed. Patient can be discharged from a cardiac standpoint and followup with Dr. Acie Fredrickson. Plan functional study after she recovers from her GI bleed. 2 question ventricular tachycardia-rhythm strips reviewed and appears to be artifact. She remains in sinus rhythm today. 3 coronary artery disease-we'll continue all preadmission medications at discharge other than aspirin. Aspirin will be resumed when her GI bleed is stable. 4 GI bleed-continue Protonix. Management per gastroenterology. We will sign off. Please call with questions. Kirk Ruths 12/15/2012, 7:24 AM

## 2012-12-15 NOTE — Evaluation (Signed)
Physical Therapy Evaluation Patient Details Name: [name] MRN: [id number] DOB: 11/21/1933 Today's Date: 12/15/2012 Time: 4098-1191 PT Time Calculation (min): 16 min  PT Assessment / Plan / Recommendation Clinical Impression  79 yo female admitted with chest pain, hypotension, GI bleed. On eval, pt required Min guard-Supervision level assist-able to ambulate ~250 feet. C/o mild lightheadedness/dizziness during session. Do not anticipate pt will have any follow up PT needs at discharge. Will follow during stay.     PT Assessment  Patient needs continued PT services    Follow Up Recommendations  No PT follow up    Does the patient have the potential to tolerate intense rehabilitation      Barriers to Discharge        Equipment Recommendations  None recommended by PT    Recommendations for Other Services     Frequency Min 3X/week    Precautions / Restrictions Precautions Precautions: Fall Precaution Comments: watch BP during mobility Restrictions Weight Bearing Restrictions: No   Pertinent Vitals/Pain Supine: 124/48 Sitting: 128/39 Standing: 132/36 End of session/after walking: 145/52       Mobility  Bed Mobility Bed Mobility: Supine to Sit Supine to Sit: 5: Supervision Sitting - Scoot to Edge of Bed: 5: Supervision Transfers Transfers: Sit to Stand;Stand to Sit Sit to Stand: 5: Supervision;From bed Stand to Sit: 5: Supervision;To chair/3-in-1 Ambulation/Gait Ambulation/Gait Assistance: 4: Min guard Ambulation Distance (Feet): 250 Feet Assistive device: None Ambulation/Gait Assistance Details: Good gait speed. No arm swing-pt holding wrists, arms folded, against torso. Pt c/o mild lightheadedness with turns while walking.  Gait Pattern: Step-through pattern    Exercises     PT Diagnosis: Difficulty walking  PT Problem List: Decreased mobility PT Treatment Interventions: Gait training;Functional mobility training;Therapeutic activities;Therapeutic  exercise;Patient/family education   PT Goals Acute Rehab PT Goals PT Goal Formulation: With patient Time For Goal Achievement: 12/29/12 Potential to Achieve Goals: Good Pt will go Supine/Side to Sit: with modified independence PT Goal: Supine/Side to Sit - Progress: Goal set today Pt will go Sit to Supine/Side: with modified independence PT Goal: Sit to Supine/Side - Progress: Goal set today Pt will go Sit to Stand: with modified independence PT Goal: Sit to Stand - Progress: Goal set today Pt will Ambulate: >150 feet;with modified independence PT Goal: Ambulate - Progress: Goal set today  Visit Information  Last PT Received On: 12/15/12 Assistance Needed: +1    Subjective Data  Subjective: My BP is going to drop Patient Stated Goal: Get better. Home   Prior Functioning  Home Living Lives With: Alone Available Help at Discharge: Friend(s);Available 24 hours/day;Other (Comment) (for a few days.) Type of Home: House Home Access: Ramped entrance Home Layout: One level Bathroom Shower/Tub: Walk-in shower;Door [employer] Toilet: Pharmacist, community: Yes How Accessible: Accessible via walker Home Adaptive Equipment: Built-in shower seat;Walker - rolling Prior Function Level of Independence: Independent Able to Take Stairs?: Yes Driving: Yes Vocation: Retired Musician: No difficulties Dominant Hand: Right    Cognition  Cognition Arousal/Alertness: Awake/alert Behavior During Therapy: WFL for tasks assessed/performed Overall Cognitive Status: Within Functional Limits for tasks assessed    Extremity/Trunk Assessment Right Upper Extremity Assessment RUE ROM/Strength/Tone: Within functional levels RUE Sensation: WFL - Light Touch RUE Coordination: WFL - gross/fine motor Left Upper Extremity Assessment LUE ROM/Strength/Tone: Within functional levels LUE Sensation: WFL - Light Touch LUE Coordination: WFL - gross/fine motor Right Lower  Extremity Assessment RLE ROM/Strength/Tone: WFL for tasks assessed Left Lower Extremity Assessment LLE ROM/Strength/Tone: [facility] for tasks  assessed Trunk Assessment Trunk Assessment: Normal   Balance    End of Session PT - End of Session Equipment Utilized During Treatment: Gait belt Activity Tolerance: Patient tolerated treatment well Patient left: in chair;with call bell/phone within reach;with family/visitor present  GP     Rebeca Alert, MPT Pager: [phone]

## 2012-12-15 NOTE — Clinical Social Work Note (Signed)
CSW met with Pt at request of RN to discuss HCPOA paperwork. Pt is interested in completing HCPOA paperwork prior to d/c. CSW provided paperwork, explained process and answered questions. Pt stated understanding and will review paperwork. She agrees to contact CSW when she is ready to sign. She understands the need for two witnesses when she is ready to sign.   Doreen Salvage, LCSW ICU/Stepdown Clinical Social Worker [facility] Cell [phone] Hours 8am-1200pm M-F

## 2012-12-15 NOTE — Evaluation (Signed)
Occupational Therapy Evaluation Patient Details Name: [name] MRN: [id number] DOB: 07-May-1934 Today's Date: 12/15/2012 Time: 4098-1191 OT Time Calculation (min): 28 min  OT Assessment / Plan / Recommendation Clinical Impression  Pt is a 79 yo female admitted for chest pain and was found to have GI bleed and cdiff after a sigmoidoscopy who has the deficits listed below.  Feel pt will be about to d/c home when stable with no further OT.    OT Assessment  Patient needs continued OT Services    Follow Up Recommendations  No OT follow up    Barriers to Discharge Decreased caregiver support pt does need to be I as she lives alone.  she does have friends that can stay with he for a few days.  Equipment Recommendations  None recommended by OT    Recommendations for Other Services    Frequency  Min 2X/week    Precautions / Restrictions Precautions Precautions: None Precaution Comments: watch BP during mobility Restrictions Weight Bearing Restrictions: No   Pertinent Vitals/Pain Pt sitting BP 121/32.  Pt returned to supine and it recovered to 122/53.  Extensive adls and mobility deferred due to low BP and dizziness.    ADL  Eating/Feeding: Simulated;Independent Where Assessed - Eating/Feeding: Bed level Grooming: Performed;Wash/dry hands;Wash/dry face;Teeth care;Set up Where Assessed - Grooming: Supported sitting Upper Body Bathing: Simulated;Set up Where Assessed - Upper Body Bathing: Supported sitting Lower Body Bathing: Simulated;Set up Where Assessed - Lower Body Bathing: Supported sit to stand Upper Body Dressing: Simulated;Set up Where Assessed - Upper Body Dressing: Supported sitting Lower Body Dressing: Performed;Supervision/safety Where Assessed - Lower Body Dressing: Supported sit to stand Toilet Transfer: Performed;Min guard Statistician Method: Sit to Barista: Materials engineer and Hygiene:  Supervision/safety;Simulated Where Assessed - Engineer, mining and Hygiene: Sit to stand from 3-in-1 or toilet Transfers/Ambulation Related to ADLs: transfers with s.  Unable to walk in room due to low BP ADL Comments: Feel pt will do fine with adls.  Limited due to pts low BP.    OT Diagnosis: Generalized weakness  OT Problem List: Decreased activity tolerance OT Treatment Interventions: Self-care/ADL training;Therapeutic activities   OT Goals Acute Rehab OT Goals OT Goal Formulation: With patient Time For Goal Achievement: 12/22/12 Potential to Achieve Goals: Good ADL Goals Pt Will Perform Grooming: with modified independence;Standing at sink ADL Goal: Grooming - Progress: Goal set today Pt Will Perform Tub/Shower Transfer: with modified independence ADL Goal: Tub/Shower Transfer - Progress: Goal set today Additional ADL Goal #1: Pt will complete all aspects of toileting on regular commode with mod I. ADL Goal: Additional Goal #1 - Progress: Goal set today  Visit Information  Last OT Received On: 12/15/12 Assistance Needed: +1    Subjective Data  Subjective: "I just can't seem to keep my BP up." Patient Stated Goal: to get home.   Prior Functioning     Home Living Lives With: Alone Available Help at Discharge: Friend(s);Available 24 hours/day;Other (Comment) (for a few days.) Type of Home: House Home Access: Ramped entrance Home Layout: One level Bathroom Shower/Tub: Walk-in shower;Door [employer] Toilet: Standard Bathroom Accessibility: Yes How Accessible: Accessible via walker Home Adaptive Equipment: Built-in shower seat;Walker - rolling Prior Function Level of Independence: Independent Able to Take Stairs?: Yes Driving: Yes Vocation: Retired Musician: No difficulties Dominant Hand: Right         Vision/Perception Vision - History Baseline Vision: No visual deficits Patient Visual Report: No change from  baseline Vision  - Assessment Vision Assessment: Vision not tested   Cognition  Cognition Arousal/Alertness: Awake/alert Behavior During Therapy: WFL for tasks assessed/performed Overall Cognitive Status: Within Functional Limits for tasks assessed    Extremity/Trunk Assessment Right Upper Extremity Assessment RUE ROM/Strength/Tone: Within functional levels RUE Sensation: WFL - Light Touch RUE Coordination: WFL - gross/fine motor Left Upper Extremity Assessment LUE ROM/Strength/Tone: Within functional levels LUE Sensation: WFL - Light Touch LUE Coordination: WFL - gross/fine motor Trunk Assessment Trunk Assessment: Normal     Mobility Bed Mobility Bed Mobility: Supine to Sit;Sitting - Scoot to Edge of Bed;Sit to Supine Supine to Sit: 5: Supervision Sitting - Scoot to Edge of Bed: 5: Supervision Sit to Supine: 5: Supervision Details for Bed Mobility Assistance: No assist needed. Transfers Transfers: Sit to Stand;Stand to Sit Sit to Stand: 5: Supervision;From bed Stand to Sit: 5: Supervision;To bed Details for Transfer Assistance: pts BP of 121/32 did not allow pt to stay in standing very long, but when she did move around, she was very safe.     Exercise     Balance     End of Session OT - End of Session Activity Tolerance: Other (comment) (limited by BP) Patient left: in bed;with call bell/phone within reach;with family/visitor present;with bed alarm set Nurse Communication: Mobility status  GO     Hope Budds 12/15/2012, 11:18 AM [phone]

## 2012-12-15 NOTE — Progress Notes (Signed)
[id number] Earlene Plater, RN, BSN, CCM:  CHART REVIEWED AND UPDATED.  Next chart review due on [id number]. NO DISCHARGE NEEDS PRESENT AT THIS TIME. CASE MANAGEMENT [phone]

## 2012-12-15 NOTE — Progress Notes (Signed)
Pt transferred to Elijah Birk, RN on 5 [place].  Will notify pt's daughter of new room number in am. Erick Colace

## 2012-12-15 NOTE — Progress Notes (Signed)
[facility] PROGRESS NOTE  [name] JQB:[id number] DOB: October 16, 1933 DOA: 12/11/2012 PCP: [name], NP  Assessment/Plan: #1 ?? Ventricular tachy vs Torsades de ponites Patient has been seen by cardiology and rhythm strips were reviewed and per cardiology likely an artifact. Patient in sinus rhythm. Monitor electrolytes. Per cardiology followup as outpatient.  #2 porbable upper GI bleed 3 nights ago patient noted to have hematemesis and melanotic stools which were guaiac positive. Patient's hemoglobin trended downwards. S/p 2 units PRBCs. Patient s/p EGD and flex sig negative for source of bleeding. Continue PPI.  Hemoglobin stable at 8.8. Patient with black stools overnight. No further ongoing bleeding noted. Normal saline lock IV fluids. Patient tolerating current diet. F/u with GI as outpatient.  #3 chest pain Questionable etiology. May be secondary to problem #2 as patient was noted to have hematemesis and melanotic stools. Patient also however with a significant cardiac history. Cardiac enzymes negative x3. 2-D echo with EF 50-55% with severe hypokinesis of entire inferior myocardium which was on prior 2 D Echo. Held cardiac medications secondary to borderline hypotension. Continue to hold diuretics and beta blocker. Follow.   #4. Hypotension ?? Etiology. Maybe likely hypovolemia. Cardiac enzymes neg x 3. 2 d echo with EF 50-55% with hypokinesis of inferior wall unchanged. Blood pressure has responded to IV fluids. Normal saline lock IV fluids. Random cortisol level was 7.2 . BP borderline. Patient afebrile with nl WBC. Continue to hold BP meds. Normal saline lock IVF. Follow.  #5 C. difficile colitis Patient noted to be C. difficile positive. Patient denies any significant diarrhea.. Continue Flagyl.  #6 diabetes mellitus Hemoglobin A1c 7.9. CBGs have ranged from 183 -336. Patient with episode of CBG of 55 and asymptomatic 2 days ago and a such a Lantus dose has been  decreased.  Increase lantus to 40 units BID. Sliding scale insulin.  #7 acute blood loss anemia Likely secondary to hematemesis and melanotic stools. Patient with no further bleeding. Patient's hemoglobin dropped to 7    3 days ago, and patient was transfused 2 units packed red blood cells. Current hemoglobin today is 8.8 and stable.  #8 thrombocytopenia Questionable etiology. Patient with a prior history of thrombocytopenia. Platelets slowly trending back up.Haptoglobin is 71. LDH is 214. No signs of bleeding. Peripheral smear pending. Patient is not on Lovenox. Follow.  #9 hypothyroidism TSH is 0.521. Continue home dose Synthroid.  #10 leukocytosis May be secondary to C. difficile colitis. Urinalysis is negative. White count has trended down. Continue Flagyl.  #11 Hypokalemia Repleted. Keep potassium at 4.  #12 COPD  patient with minimal wheezing. Continue scheduled nebs. S/p one-time dose of Solu-Medrol 60 mg IV x1 yesterday. Normal saline lock IV fluids. Follow.  #13 prophylaxis PPI for GI prophylaxis. SCDs for DVT prophylaxis.   Code Status: Full Family Communication: Updated patient no family at bedside Disposition Plan: Home when medically stable   Consultants:  Gastroenterology: Dr. [name] 12/12/2012  Procedures:  EGD 12/12/12  Flex sig 12/12/12  2 units PRBCs  2 D Echo 12/12/12  Antibiotics:  Flagyl 12/12/12  HPI/Subjective: Patient with no further episodes of hematemesis or melanotic stools. Patient denies any chest pain. Patient with no further arrythmias. Borderline BP last night. Patient states had dark stools yesterday.  Objective: Filed Vitals:   12/15/12 0500 12/15/12 0600 12/15/12 0700 12/15/12 0800  BP:  130/46  95/81  Pulse: 78 73 72 80  Temp:    97.7 F (36.5 C)  TempSrc:    Oral  Resp: 19 22 22 18   Height:      Weight: 92.5 kg (203 lb 14.8 oz)     SpO2: 100% 100% 100% 100%    Intake/Output Summary (Last 24 hours) at 12/15/12 0856 Last  data filed at 12/15/12 0800  Gross per 24 hour  Intake   1440 ml  Output   2300 ml  Net   -860 ml   Filed Weights   12/13/12 0416 12/14/12 0422 12/15/12 0500  Weight: 88.8 kg (195 lb 12.3 oz) 90.5 kg (199 lb 8.3 oz) 92.5 kg (203 lb 14.8 oz)    Exam:   General:  NAD  Cardiovascular: RRR  Respiratory: CTAB. Upper airway noise.  Abdomen: Soft, some tenderness to palpation in the upper abdomen diffuse, mild distension, positive bowel sounds.  Musculoskeletal: 5/5 bue STRENGTH, 5/5 BLE strength  Data Reviewed: Basic Metabolic Panel:  Recent Labs Lab 12/11/12 2103 12/12/12 0535 12/13/12 0555 12/14/12 0522 12/15/12 0340  NA 133* 137 142 138 134*  K 4.1 4.4 3.3* 3.6 4.3  CL 101 106 110 110 103  CO2 25 22 24 23 24   GLUCOSE 224* 352* 114* 175* 323*  BUN 45* 45* 25* 14 13  CREATININE 0.80 0.75 0.81 0.78 0.71  CALCIUM 8.8 8.8 8.1* 7.8* 8.3*  MG  --   --   --  1.9 2.2   Liver Function Tests:  Recent Labs Lab 12/11/12 2103  AST 27  ALT 26  ALKPHOS 177*  BILITOT 0.6  PROT 6.1  ALBUMIN 2.6*    Recent Labs Lab 12/11/12 2103  LIPASE 25   No results found for this basename: AMMONIA,  in the last 168 hours CBC:  Recent Labs Lab 12/11/12 2103  12/12/12 1304 12/13/12 0555 12/13/12 1654 12/14/12 0522 12/15/12 0340  WBC 13.4*  < > 9.7 7.4 7.9 5.8 6.7  NEUTROABS 10.5*  --   --   --   --   --   --   HGB 9.8*  < > 7.0* 8.8* 8.8* 8.6* 8.8*  HCT 29.9*  < > 21.4* 26.3* 26.2* 25.9* 26.6*  MCV 82.8  < > 83.3 84.0 85.1 85.5 85.0  PLT 120*  < > 96* 69* 83* 69* 74*  < > = values in this interval not displayed. Cardiac Enzymes:  Recent Labs Lab 12/12/12 0130 12/12/12 0700 12/12/12 1304  TROPONINI <0.30 <0.30 <0.30   BNP (last 3 results)  Recent Labs  11/25/12 1534 12/12/12 0100  PROBNP 90.0 80.2   CBG:  Recent Labs Lab 12/14/12 0420 12/14/12 1202 12/14/12 1758 12/14/12 2207 12/15/12 0752  GLUCAP 167* 183* 336* 301* 261*    Recent Results (from  the past 240 hour(s))  CLOSTRIDIUM DIFFICILE BY PCR     Status: Abnormal   Collection Time    12/12/12  3:36 AM      Result Value Range Status   C difficile by pcr POSITIVE (*) NEGATIVE Final   Comment: CRITICAL RESULT CALLED TO, READ BACK BY AND VERIFIED WITH:     T. SCRONCE 9:45 12/12/12 ([email or web address])  URINE CULTURE     Status: None   Collection Time    12/12/12  9:20 AM      Result Value Range Status   Specimen Description URINE, CLEAN CATCH   Final   Special Requests NONE   Final   Culture  Setup Time 12/12/2012 15:54   Final   Colony Count 1,000 COLONIES/ML   Final   Culture INSIGNIFICANT GROWTH   Final  Report Status 12/13/2012 FINAL   Final  MRSA PCR SCREENING     Status: None   Collection Time    12/14/12 12:33 PM      Result Value Range Status   MRSA by PCR NEGATIVE  NEGATIVE Final   Comment:            The GeneXpert MRSA Assay (FDA     approved for NASAL specimens     only), is one component of a     comprehensive MRSA colonization     surveillance program. It is not     intended to diagnose MRSA     infection nor to guide or     monitor treatment for     MRSA infections.     Studies: Dg Chest Port 1 View  12/14/2012  *RADIOLOGY REPORT*  Clinical Data: Wheezing, history of COPD and asthma  PORTABLE CHEST - 1 VIEW  Comparison: 12/11/2012; 06/02/2011; chest CT - 10/13/2012  Findings:  Grossly unchanged cardiac silhouette and mediastinal contours given decreased lung volumes and projection.  There is mild diffuse thickening of the pulmonary stations.  No focal airspace opacities. No definite pleural effusion or pneumothorax.  Grossly unchanged bones.  IMPRESSION: Mild bronchitic change/airways disease suspected on this low lung volume AP portable examination.  Further evaluation with a PA and lateral chest radiograph may be obtained as clinically indicated.   Original Report Authenticated By: Jake Seats, MD     Scheduled Meds: . albuterol  2.5 mg Nebulization Q6H  .  atorvastatin  20 mg Oral Daily  . insulin aspart  0-9 Units Subcutaneous TID WC & HS  . insulin glargine  40 Units Subcutaneous Daily  . insulin glargine  40 Units Subcutaneous QHS  . ipratropium  0.5 mg Nebulization Q6H  . levothyroxine  125 mcg Oral Daily  . metroNIDAZOLE  500 mg Oral Q8H  . montelukast  5 mg Oral QHS  . pantoprazole  40 mg Oral BID AC  . sodium chloride  3 mL Intravenous Q12H   Continuous Infusions:    Principal Problem:   Chest pain, unspecified Active Problems:   Heart failure, diastolic, chronic   COPD (chronic obstructive pulmonary disease)   Clostridium difficile colitis   GIB (gastrointestinal bleeding)   Acute blood loss anemia   Hypotension, unspecified   Hypokalemia   Thrombocytopenia, unspecified   Torsades de pointes    Time spent: > 35 mins    [facility] Pager [phone]. If 7PM-7AM, please contact night-coverage at [email or web address], password [facility] 12/15/2012, 8:56 AM  LOS: 4 days

## 2012-12-16 LAB — COMPREHENSIVE METABOLIC PANEL
ALT: 20 U/L (ref 0–35)
AST: 24 U/L (ref 0–37)
Albumin: 2.7 g/dL — ABNORMAL LOW (ref 3.5–5.2)
Alkaline Phosphatase: 143 U/L — ABNORMAL HIGH (ref 39–117)
BUN: 14 mg/dL (ref 6–23)
CO2: 26 mEq/L (ref 19–32)
Calcium: 8.1 mg/dL — ABNORMAL LOW (ref 8.4–10.5)
Chloride: 104 mEq/L (ref 96–112)
Creatinine, Ser: 0.9 mg/dL (ref 0.50–1.10)
GFR calc Af Amer: 70 mL/min — ABNORMAL LOW (ref >90)
GFR calc non Af Amer: 61 mL/min — ABNORMAL LOW (ref >90)
Glucose, Bld: 230 mg/dL — ABNORMAL HIGH (ref 70–99)
Potassium: 3.6 mEq/L (ref 3.5–5.1)
Sodium: 137 mEq/L (ref 135–145)
Total Bilirubin: 0.4 mg/dL (ref 0.3–1.2)
Total Protein: 5.6 g/dL — ABNORMAL LOW (ref 6.0–8.3)

## 2012-12-16 LAB — MAGNESIUM: Magnesium: 1.9 mg/dL (ref 1.5–2.5)

## 2012-12-16 LAB — CBC
HCT: 26.7 % — ABNORMAL LOW (ref 36.0–46.0)
Hemoglobin: 8.8 g/dL — ABNORMAL LOW (ref 12.0–15.0)
MCH: 28.4 pg (ref 26.0–34.0)
MCHC: 33 g/dL (ref 30.0–36.0)
MCV: 86.1 fL (ref 78.0–100.0)
Platelets: 80 10*3/uL — ABNORMAL LOW (ref 150–400)
RBC: 3.1 MIL/uL — ABNORMAL LOW (ref 3.87–5.11)
RDW: 17.3 % — ABNORMAL HIGH (ref 11.5–15.5)
WBC: 5.9 10*3/uL (ref 4.0–10.5)

## 2012-12-16 LAB — GLUCOSE, CAPILLARY
Glucose-Capillary: 201 mg/dL — ABNORMAL HIGH (ref 70–99)
Glucose-Capillary: 249 mg/dL — ABNORMAL HIGH (ref 70–99)

## 2012-12-16 MED ORDER — CARVEDILOL 12.5 MG PO TABS: 12.5000 mg | ORAL_TABLET | Freq: Two times a day (BID) | ORAL | Status: DC

## 2012-12-16 MED ORDER — PANTOPRAZOLE SODIUM 40 MG PO TBEC: 40.0000 mg | DELAYED_RELEASE_TABLET | Freq: Two times a day (BID) | ORAL | Status: DC

## 2012-12-16 MED ORDER — METRONIDAZOLE 500 MG PO TABS: 500.0000 mg | ORAL_TABLET | Freq: Three times a day (TID) | ORAL | Status: AC

## 2012-12-16 MED ORDER — FUROSEMIDE 40 MG PO TABS: 40.0000 mg | ORAL_TABLET | Freq: Every day | ORAL | Status: DC

## 2012-12-16 NOTE — Progress Notes (Signed)
Inpatient Diabetes Program Recommendations  AACE/ADA: New Consensus Statement on Inpatient Glycemic Control (2013)  Target Ranges:  Prepandial:   less than 140 mg/dL      Peak postprandial:   less than 180 mg/dL (1-2 hours)      Critically ill patients:  140 - 180 mg/dL   Reason for Visit: Hyperglycemia  Results for LINDE, WILENSKY (MRN [id number]) as of 12/16/2012 11:00  Ref. Range 12/12/2012 01:30  Hemoglobin A1C Latest Range: <5.7 % 7.9 (H)  Results for JERILEE, SPACE (MRN [id number]) as of 12/16/2012 11:00  Ref. Range 12/14/2012 17:58 12/14/2012 22:07 12/15/2012 07:52 12/15/2012 11:57 12/15/2012 17:28 12/15/2012 22:01 12/16/2012 07:27  Glucose-Capillary Latest Range: 70-99 mg/dL 782 (H) 956 (H) 213 (H) 238 (H) 348 (H) 330 (H) 201 (H)    Inpatient Diabetes Program Recommendations Insulin - Basal: Increase Lantus to 45 units bid Correction (SSI): Increase Novolog to moderate tidwc and hs Insulin - Meal Coverage: Add Novolog 10 units tidwc for meal coverage insulin HgbA1C: 7.9% - sub-optimal control  Note: Hypoglycemia on 4/26 likely from pt eating poorly at lunch and received 32 units of Novolog (Home dose of Novolog 30 tid plus correction)  Thank you. Ailene Ards, RD, LDN, CDE Inpatient Diabetes Coordinator [phone]

## 2012-12-16 NOTE — Discharge Summary (Signed)
Physician Discharge Summary  [name] HLK:[id number] DOB: July 12, 1934 DOA: 12/11/2012  PCP: Madison Jaegers, NP  Admit date: 12/11/2012 Discharge date: 12/16/2012  Time spent: 70 minutes  Recommendations for Outpatient Follow-up:  1. Patient is to followup with PCP one week post discharge. All followup patient in need a CBC done to followup on her hemoglobin. Patient will need a basic metabolic profile done to followup on electrolytes and renal function. 2. Patient is to followup with her cardiologist, Dr. Acie Fredrickson in 1- 2 weeks post discharge. 3. Patient is to followup with her gastroenterologist, Dr. Amedeo Hogan in 1- 2 weeks.  Discharge Diagnoses:  Principal Problem:   Chest pain, unspecified Active Problems:   Heart failure, diastolic, chronic   COPD (chronic obstructive pulmonary disease)   Clostridium difficile colitis   GIB (gastrointestinal bleeding)   Acute blood loss anemia   Hypotension, unspecified   Hypokalemia   Thrombocytopenia, unspecified   Discharge Condition: Stable and improved  Diet recommendation:  Carb Modified diet  Filed Weights   12/13/12 0416 12/14/12 0422 12/15/12 0500  Weight: 88.8 kg (195 lb 12.3 oz) 90.5 kg (199 lb 8.3 oz) 92.5 kg (203 lb 14.8 oz)    History of present illness:  Pt is 79 yo female with complex and multiple medical problems outlined below, presents to [facility] ED with main concern of substernal chest discomfort that started initially one day prior to admission, pressure - like, non radiating, 4/10 in severity when present, associated with nausea and malaise, exertional shortness of breath. Pt reports similar events in the past but unable to recall details. Pt denies fevers, chills, no other abdominal or urinary concerns, no specific alleviating or aggravating factors, no headaches or visual changes, no focal neurological symptoms.  In ED, pt with persistent discomfort, [facility] asked to admit for chest pain rule out. ED doc consulted cardio and they  will see in consultation.    Hospital Course:  #1 porbable upper GI bleed  Patient was admitted with chest pain however on the night of admission patient was noted to have hematemesis and melanotic stools which were guaiac positive. Patient was subsequently made n.p.o. Serial CBCs were obtained. And a GI consultation was obtained and patient was seen in consultation by Dr. Cristina Hogan on 12/12/12.  Patient's hemoglobin trended downwards to 7.0 from 9.8 on admission. Patient was also noted to have a hemoglobin of 14.2 in September of 2013. Patient was subsequently transfused 2 units PRBCs. Patient underwent a EGD and flex sig which were both negative for source of bleeding. Patient was maintained on a proton pump inhibitor twice daily which was subsequently transitioned to oral Protonix twice daily. Patient's hemoglobin was followed and seen to have stabilized at 8.8. Patient did not have any further ongoing bleeding. Patient was discharged home in stable and improved condition on Protonix twice daily and is to followup with her gastroenterologist Dr. Amedeo Hogan in one to 2 weeks. Patient was discharged in stable and improved condition. #2 chest pain  Patient was admitted with substernal chest pain. Cardiac enzymes which was cycled were negative x3. It was felt patient's chest pain likely secondary to upper GI bleed. 2-D echo was done which showed a EF 50-55% with severe hypokinesis of entire inferior myocardium which was on prior 2 D Echo. Held cardiac medications secondary to borderline hypotension. Patient blood pressure improved. Patient's chest pain resolved. Patient be discharged home in stable and improved condition to followup with her cardiologist as outpatient. Patient's cardiac medications will be resumed  2 days post discharge. #3. Hypotension  During the hospitalization patient was noted to be hypotensive. It was felt this was likely secondary to hypovolemia secondary to her GI bleed. Patient's cardiac  medications were held of Coreg and diuretics. Patient was hydrated with IV fluids. Random cortisol level of days was relatively low at 7.2. Patient's blood pressure responded to IV fluids. Cardiac enzymes which was cycled were negative x3. 2-D echo is unchanged from prior 2-D echo. Patient was noted to have a C. difficile colitis which was treated with Flagyl. Patient's blood pressure improved and her hypotension had resolved by day of discharge. Patient may need outpatient followup secondary to a relatively low cortisol levels.  #4 C. difficile colitis  Patient noted to be C. difficile positive by stool studies done. Patient was started on Flagyl. Patient denies any significant diarrhea. Patient remained stable. Patient will be discharged home on 2 more days of oral Flagyl to complete a two-week course of antibiotic therapy. Patient will followup with PCP as outpatient.  #5 diabetes mellitus  Hemoglobin A1c 7.9. During the hospitalization due to patient being n.p.o. patient had a blood sugars drop and subsequently had her insulin doses decreased initially. As patient was started back on a diet and insulin requirements increased and she was titrated back up on her Lantus. Patient be discharged home on a home regimen of Lantus twice daily as well as a meal coverage NovoLog. Patient will followup with PCP as outpatient. #6 acute blood loss anemia  Likely secondary to hematemesis and melanotic stools. Patient with no further bleeding. Patient's hemoglobin dropped to 7, during the hospitalization and patient was transfused 2 units packed red blood cells. Patient's blood pressure stabilized at 8.8. Patient did not have any further hematemesis or melanotic stools. Patient will followup with PCP and GI as outpatient.   #7 thrombocytopenia  Questionable etiology. Patient with a prior history of thrombocytopenia. Platelets slowly trending back up.Haptoglobin is 71. LDH is 214. No signs of bleeding. Peripheral smear  pending. Patient is not on Lovenox. Follow.  #8 hypothyroidism  TSH is 0.521. Continued on home dose Synthroid.  #9 leukocytosis  May be secondary to C. difficile colitis. Urinalysis is negative. Chest x-ray was negative for any acute infiltrate. Patient's leukocytosis trending down. Patient was treated with Flagyl during the hospitalization secondary to C. difficile colitis. Patient be discharged home on 2 more days of oral Flagyl to complete a 14 day course of therapy.  #10 Hypokalemia  Felt to be secondary to GI losses. Was repleted. #11 COPD  During the hospitalization patient did have some minimal to mild wheezing. Patient was placed on scheduled nebulizer treatments and given a dose of IV Solu-Medrol. Patient's wheezing improved and had resolved by day of discharge. Was also felt that patient also did have a component of upper airway noise.   #12 questionable ventricular tachycardia During the hospitalization she was noted to have an abnormal rhythm. There was concern that patient may have had torsades de pointes versus ventricular tachycardia. Patient was subsequently transferred to the ICU given a couple doses of magnesium and electrolytes repleted and a cardiology consultation obtained. Patient was seen in consultation by Dr. Rayann Heman who felt rhythm was secondary to artifact. Patient remained asymptomatic. Patient did not have any further abnormal rhythms. Patient be discharged home in stable condition. Patient to followup with her cardiologist as outpatient.  The rest of patient's chronic medical issues remained stable throughout the hospitalization the patient be discharged in stable and improved  condition.     Procedures: EGD 12/12/12  Flex sig 12/12/12  2 units PRBCs  2 D Echo 12/12/12     Consultations: Gastroenterology: Dr. Cristina Hogan 12/12/2012 Cardiology: Dr Rayann Heman 12/14/12     Discharge Exam: Filed Vitals:   12/15/12 2000 12/15/12 2352 12/16/12 0951 12/16/12 1015  BP:  138/58 145/81  123/78  Pulse: 81 82  79  Temp: 98.2 F (36.8 C) 98.2 F (36.8 C)  98.3 F (36.8 C)  TempSrc: Oral Oral  Oral  Resp: 22 20  18   Height:      Weight:      SpO2: 96% 97% 95% 97%    General: NAD Cardiovascular: RRR Respiratory: CTAB  Discharge Instructions  Discharge Orders   Future Appointments Provider Department Dept Phone   12/24/2012 2:30 PM Ivin Poot, MD [facility] [phone]   Future Orders Complete By Expires     Diet Carb Modified  As directed     Discharge instructions  As directed     Comments:      Follow up with Dr Acie Fredrickson in 1-2 weeks Follow up with Dr [name] in 1-2 weeks Follow up with Madison Jaegers, NP in 1 week.    Increase activity slowly  As directed         Medication List    STOP taking these medications       aspirin EC 81 MG tablet     ranitidine 150 MG tablet  Commonly known as:  ZANTAC      TAKE these medications       atorvastatin 20 MG tablet  Commonly known as:  LIPITOR  Take 20 mg by mouth daily.     CALCIUM + D PO  Take 1 tablet by mouth daily.     carvedilol 12.5 MG tablet  Commonly known as:  COREG  Take 1 tablet (12.5 mg total) by mouth 2 (two) times daily. Restart in 2 days.  Start taking on:  12/18/2012     diphenoxylate-atropine 2.5-0.025 MG per tablet  Commonly known as:  LOMOTIL  Take 1 tablet by mouth daily as needed for diarrhea or loose stools.     escitalopram 10 MG tablet  Commonly known as:  LEXAPRO  Take 10 mg by mouth daily as needed (mood.).     furosemide 40 MG tablet  Commonly known as:  LASIX  Take 1 tablet (40 mg total) by mouth daily. Resume in 2 days.  Start taking on:  12/18/2012     HYDROcodone-acetaminophen 5-325 MG per tablet  Commonly known as:  NORCO/VICODIN  Take 1 tablet by mouth every 6 (six) hours as needed for pain.     insulin aspart 100 UNIT/ML injection  Commonly known as:  novoLOG  Inject 30 Units into the skin 3  (three) times daily before meals.     insulin glargine 100 UNIT/ML injection  Commonly known as:  LANTUS  Inject 45-50 Units into the skin See admin instructions. 50 units in the mornings and 45 units at night     levothyroxine 125 MCG tablet  Commonly known as:  SYNTHROID, LEVOTHROID  Take 125 mcg by mouth daily.     metroNIDAZOLE 500 MG tablet  Commonly known as:  FLAGYL  Take 1 tablet (500 mg total) by mouth every 8 (eight) hours. Take for 10 days then stop.     multivitamin with minerals Tabs  Take 1 tablet by mouth daily.     nitroGLYCERIN  0.4 MG SL tablet  Commonly known as:  NITROSTAT  Place 0.4 mg under the tongue every 5 (five) minutes as needed. Chest pain     pantoprazole 40 MG tablet  Commonly known as:  PROTONIX  Take 1 tablet (40 mg total) by mouth 2 (two) times daily before a meal.     potassium chloride 10 MEQ CR tablet  Commonly known as:  KLOR-CON  Take 10 mEq by mouth 4 (four) times daily.     promethazine 25 MG tablet  Commonly known as:  PHENERGAN  Take 25 mg by mouth every 4 (four) hours as needed for nausea.     SINGULAIR PO  Take 10 mg by mouth every morning.           Follow-up Information   Follow up with Madison Jaegers, NP. Schedule an appointment as soon as possible for a visit in 1 week.   Contact information:   [address] [place] [address] [phone]       Follow up with Darden Amber., MD. Schedule an appointment as soon as possible for a visit in 2 weeks. (f/u in 1-2 weeks)    Contact information:   [address] [place] [address] [phone]       Follow up with HAYES,JOHN C, MD. Schedule an appointment as soon as possible for a visit in 2 weeks. (f/u in 1-2 weeks)    Contact information:   [address] [place] [address] [phone]        The results of significant diagnostics from this hospitalization (including  imaging, microbiology, ancillary and laboratory) are listed below for reference.    Significant Diagnostic Studies: Dg Chest 2 View  12/11/2012  *RADIOLOGY REPORT*  Clinical Data: Altered mental status.  Shortness of breath.  CHEST - 2 VIEW  Comparison: 06/02/2011  Findings: Mildly degraded exam due to AP portable technique and patient body habitus.  Apical lordotic patient positioning. Cardiomegaly accentuated by AP portable technique.  No right and no definite left pleural effusion. No pneumothorax.  Clear lungs.  IMPRESSION: No acute cardiopulmonary disease.   Original Report Authenticated By: Abigail Miyamoto, M.D.    Dg Chest Port 1 View  12/14/2012  *RADIOLOGY REPORT*  Clinical Data: Wheezing, history of COPD and asthma  PORTABLE CHEST - 1 VIEW  Comparison: 12/11/2012; 06/02/2011; chest CT - 10/13/2012  Findings:  Grossly unchanged cardiac silhouette and mediastinal contours given decreased lung volumes and projection.  There is mild diffuse thickening of the pulmonary stations.  No focal airspace opacities. No definite pleural effusion or pneumothorax.  Grossly unchanged bones.  IMPRESSION: Mild bronchitic change/airways disease suspected on this low lung volume AP portable examination.  Further evaluation with a PA and lateral chest radiograph may be obtained as clinically indicated.   Original Report Authenticated By: Jake Seats, MD     Microbiology: Recent Results (from the past 240 hour(s))  CLOSTRIDIUM DIFFICILE BY PCR     Status: Abnormal   Collection Time    12/12/12  3:36 AM      Result Value Range Status   C difficile by pcr POSITIVE (*) NEGATIVE Final   Comment: CRITICAL RESULT CALLED TO, READ BACK BY AND VERIFIED WITH:     T. SCRONCE 9:45 12/12/12 ([email or web address])  URINE  CULTURE     Status: None   Collection Time    12/12/12  9:20 AM      Result Value Range Status   Specimen Description URINE, CLEAN CATCH   Final   Special Requests NONE   Final   Culture  Setup Time 12/12/2012 15:54    Final   Colony Count 1,000 COLONIES/ML   Final   Culture INSIGNIFICANT GROWTH   Final   Report Status 12/13/2012 FINAL   Final  MRSA PCR SCREENING     Status: None   Collection Time    12/14/12 12:33 PM      Result Value Range Status   MRSA by PCR NEGATIVE  NEGATIVE Final   Comment:            The GeneXpert MRSA Assay (FDA     approved for NASAL specimens     only), is one component of a     comprehensive MRSA colonization     surveillance program. It is not     intended to diagnose MRSA     infection nor to guide or     monitor treatment for     MRSA infections.     Labs: Basic Metabolic Panel:  Recent Labs Lab 12/12/12 0535 12/13/12 0555 12/14/12 0522 12/15/12 0340 12/16/12 0505  NA 137 142 138 134* 137  K 4.4 3.3* 3.6 4.3 3.6  CL 106 110 110 103 104  CO2 22 24 23 24 26   GLUCOSE 352* 114* 175* 323* 230*  BUN 45* 25* 14 13 14   CREATININE 0.75 0.81 0.78 0.71 0.90  CALCIUM 8.8 8.1* 7.8* 8.3* 8.1*  MG  --   --  1.9 2.2 1.9   Liver Function Tests:  Recent Labs Lab 12/11/12 2103 12/16/12 0505  AST 27 24  ALT 26 20  ALKPHOS 177* 143*  BILITOT 0.6 0.4  PROT 6.1 5.6*  ALBUMIN 2.6* 2.7*    Recent Labs Lab 12/11/12 2103  LIPASE 25   No results found for this basename: AMMONIA,  in the last 168 hours CBC:  Recent Labs Lab 12/11/12 2103  12/13/12 0555 12/13/12 1654 12/14/12 0522 12/15/12 0340 12/16/12 0505  WBC 13.4*  < > 7.4 7.9 5.8 6.7 5.9  NEUTROABS 10.5*  --   --   --   --   --   --   HGB 9.8*  < > 8.8* 8.8* 8.6* 8.8* 8.8*  HCT 29.9*  < > 26.3* 26.2* 25.9* 26.6* 26.7*  MCV 82.8  < > 84.0 85.1 85.5 85.0 86.1  PLT 120*  < > 69* 83* 69* 74* 80*  < > = values in this interval not displayed. Cardiac Enzymes:  Recent Labs Lab 12/12/12 0130 12/12/12 0700 12/12/12 1304  TROPONINI <0.30 <0.30 <0.30   BNP: BNP (last 3 results)  Recent Labs  11/25/12 1534 12/12/12 0100  PROBNP 90.0 80.2   CBG:  Recent Labs Lab 12/15/12 0752  12/15/12 1157 12/15/12 1728 12/15/12 2201 12/16/12 0727  GLUCAP 261* 238* 348* 330* 201*       Signed:  THOMPSON,DANIEL  [facility] 12/16/2012, 10:47 AM

## 2012-12-17 LAB — GLUCOSE, CAPILLARY: Glucose-Capillary: 148 mg/dL — ABNORMAL HIGH (ref 70–99)

## 2012-12-24 ENCOUNTER — Encounter: Payer: Self-pay | Admitting: Cardiothoracic Surgery

## 2012-12-24 ENCOUNTER — Institutional Professional Consult (permissible substitution) (INDEPENDENT_AMBULATORY_CARE_PROVIDER_SITE_OTHER): Payer: Medicare Other | Admitting: Cardiothoracic Surgery

## 2012-12-24 VITALS — BP 106/52 | HR 86 | Resp 20 | Ht 60.0 in | Wt 199.0 lb

## 2012-12-24 DIAGNOSIS — J8410 Pulmonary fibrosis, unspecified: Secondary | ICD-10-CM

## 2012-12-24 DIAGNOSIS — J849 Interstitial pulmonary disease, unspecified: Secondary | ICD-10-CM
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
## 2012-12-29 ENCOUNTER — Encounter: Payer: Self-pay | Admitting: Cardiovascular Disease

## 2012-12-29 ENCOUNTER — Ambulatory Visit (INDEPENDENT_AMBULATORY_CARE_PROVIDER_SITE_OTHER): Payer: Medicare Other | Admitting: Cardiovascular Disease

## 2012-12-29 VITALS — BP 119/47 | HR 66 | Ht 60.0 in | Wt 190.8 lb

## 2012-12-29 DIAGNOSIS — R079 Chest pain, unspecified: Secondary | ICD-10-CM

## 2012-12-29 DIAGNOSIS — K922 Gastrointestinal hemorrhage, unspecified: Secondary | ICD-10-CM

## 2012-12-29 DIAGNOSIS — I2510 Atherosclerotic heart disease of native coronary artery without angina pectoris: Secondary | ICD-10-CM

## 2012-12-29 NOTE — Assessment & Plan Note (Signed)
[name]  is doing well. She has had some occasional episodes of atypical chest pain but has been overall very stable from a cardiac standpoint.  She was originally scheduled have a lung biopsy and was scheduled to get cardiac clearance for me. In the meantime, she was admitted to the hospital for acute GI bleed with subsequent blood loss anemia.  In my opinion I think that she is at relatively low risk from a cardiac standpoint but I do not think that she's ready from an overall medical standpoint. She still very fatigued and needs to get her hemoglobin up before we perform a lung biopsy.

## 2012-12-29 NOTE — Patient Instructions (Addendum)
Your physician wants you to follow-up in: 6 MONTHS  You will receive a reminder letter in the mail two months in advance. If you don't receive a letter, please call our office to schedule the follow-up appointment.  Your physician recommends that you continue on your current medications as directed. Please refer to the Current Medication list given to you today.   CARDIAC CLEARANCE NOT GIVEN TODAY DUE TO RECENT HOSPITALIZATION AND RISK OF BLEED.

## 2012-12-29 NOTE — Assessment & Plan Note (Signed)
She was recently admitted to the hospital for GI bleed and anemia. She still remains very weak. Her hemoglobin is gradually increasing.  From that standpoint alone I would not recommend that she have her lung biopsy this next week. I think that she needs to heal up and improve some. I would  leave this final decision up to her gastroenterologist or her medical Dr. And Dr. Prescott Gum.    She is then restarted her aspirin. She is not on Plavix.

## 2012-12-29 NOTE — Assessment & Plan Note (Signed)
She seems to be stable.  She is not having any true angina at this point.

## 2012-12-29 NOTE — Progress Notes (Signed)
Onalee Hua Date of Birth  [birth year] [facility] [address]    [place], [place]  [address] [phone]  Fax  [phone]  Problem list: 1. Coronary artery disease-status post inferior wall myocardial infarction 2. Diabetes mellitus 3. Obesity 4. Sleep apnea 5. Hypothyroidism 6 hyper lipidemia   History of Present Illness:  79 year old female with a history of coronary artery disease. She has a history of an old inferior wall myocardial infarction. She also has a history of diabetes mellitus and hypothyroidism.   She had a stress Myoview study Oct. 31, 2012  It reveals a large inferior lateral scar with only a small to moderate amount of redistribution and apical and lateral walls.. She has a known tight stenosis of a very small ramus intermediate branch that we are treating medically. I suspect this is responsible for this small amount of apical lateral redistribution.  The amount he seen on Myoview study correlates exactly with what her previous past 5 cardiac catheterizations have shown.  She has done well from a cardiac stanpoint.  Denies any chest pain or dyspnea.   She's not had any episodes of chest pain. She has been exercising on occasion.  She was having some minor bleeding and her medical doctor stopped her ASA.  She is going to have an EGD on Nov. 21.  She would like to go back on the ASA ( as would I)  Dec 29, 2012:  Jordie presents today for follow up.  She was just discharged from hospital after (and with weakness and a GI bleed. She remains very weak.  She's had lots of indigestion-like chest pain. No angina or chest pain.  She is now back on ASA.    She was found to have some scarring of her lungs. She was scheduled to have a lung biopsy in the next week or so but she still feels very fatigued and does not think that she's up for the surgery.     Current Outpatient Prescriptions on File Prior to Visit  Medication Sig Dispense Refill  .  atorvastatin (LIPITOR) 20 MG tablet Take 20 mg by mouth daily.       . Calcium Carbonate-Vitamin D (CALCIUM + D PO) Take 1 tablet by mouth daily.       . carvedilol (COREG) 12.5 MG tablet Take 1 tablet (12.5 mg total) by mouth 2 (two) times daily. Restart in 2 days.  60 tablet  6  . diphenoxylate-atropine (LOMOTIL) 2.5-0.025 MG per tablet Take 1 tablet by mouth daily as needed for diarrhea or loose stools.      Marland Kitchen escitalopram (LEXAPRO) 10 MG tablet Take 10 mg by mouth daily as needed (mood.).       Marland Kitchen furosemide (LASIX) 40 MG tablet Take 1 tablet (40 mg total) by mouth daily. Resume in 2 days.  30 tablet  0  . HYDROcodone-acetaminophen (NORCO/VICODIN) 5-325 MG per tablet Take 1 tablet by mouth every 6 (six) hours as needed for pain.      Marland Kitchen insulin aspart (NOVOLOG) 100 UNIT/ML injection Inject 30 Units into the skin 3 (three) times daily before meals.       . insulin glargine (LANTUS) 100 UNIT/ML injection Inject 45-50 Units into the skin See admin instructions. 50 units in the mornings and 45 units at night      . levothyroxine (SYNTHROID, LEVOTHROID) 125 MCG tablet Take 125 mcg by mouth daily.       . Montelukast Sodium (SINGULAIR PO) Take  10 mg by mouth every morning.       . Multiple Vitamin (MULTIVITAMIN WITH MINERALS) TABS Take 1 tablet by mouth daily.      . nitroGLYCERIN (NITROSTAT) 0.4 MG SL tablet Place 0.4 mg under the tongue every 5 (five) minutes as needed. Chest pain      . pantoprazole (PROTONIX) 40 MG tablet Take 1 tablet (40 mg total) by mouth 2 (two) times daily before a meal.  62 tablet  0  . potassium chloride (KLOR-CON) 10 MEQ CR tablet Take 10 mEq by mouth 2 (two) times daily.       . promethazine (PHENERGAN) 25 MG tablet Take 25 mg by mouth every 4 (four) hours as needed for nausea.       No current facility-administered medications on file prior to visit.    Allergies  Allergen Reactions  . Ace Inhibitors Cough  . Penicillins Hives and Swelling  . Sulfonamide Derivatives  Swelling    Past Medical History  Diagnosis Date  . Coronary artery disease     status post inferior wall myocardial infarction  . Diabetes mellitus   . Obesity   . Leg edema   . Sleep apnea   . Hypothyroidism   . Dyslipidemia   . Hypotension 08/02/2010    recent episodes of orthostatic hypotension  . Neuropathy of hand     left hand numbness/neuropathy  . Cirrhosis, non-alcoholic     Past Surgical History  Procedure Laterality Date  . Cardiac catheterization      The ejection fraction is around 50%  . Coronary stent placement    . Cholecystectomy    . Abdominal hysterectomy    . Tubal ligation    . Esophagogastroduodenoscopy N/A 12/12/2012    Procedure: ESOPHAGOGASTRODUODENOSCOPY (EGD);  Surgeon: [name], MD;  Location: [name];  Service: Endoscopy;  Laterality: N/A;  . Flexible sigmoidoscopy N/A 12/12/2012    Procedure: FLEXIBLE SIGMOIDOSCOPY;  Surgeon: [name], MD;  Location: [facility];  Service: Endoscopy;  Laterality: N/A;    History  Smoking status  . Never Smoker   Smokeless tobacco  . Never Used    History  Alcohol Use No    Family History  Problem Relation Age of Onset  . Emphysema Brother     non smoker  . Heart disease Father   . Diabetes Mother     Reviw of Systems:  Reviewed in the HPI.  All other systems are negative.  Physical Exam: BP 119/47  Pulse 66  Ht 5' (1.524 m)  Wt 190 lb 12.8 oz (86.546 kg)  BMI 37.26 kg/m2 The patient is alert and oriented x 3.  The mood and affect are normal.   Skin: warm and dry.  Color is normal.    HEENT:   Normocephalic, atraumatic. She is no JVD. Her carotids are normal.  Lungs: Clear  Heart: Regular rate S1-S2. There is a soft systolic murmur    Abdomen: Moderately obese. She has good bowel sounds.  Extremities:  No clubbing cyanosis or edema  Neuro:  Nonfocal. Gait is normal.     ECG: Dec 29, 2012:  NSR, old lateral MI.    Assessment / Plan:

## 2012-12-30 ENCOUNTER — Encounter: Payer: Medicare Other | Admitting: Physician Assistant

## 2013-01-01 ENCOUNTER — Telehealth: Payer: Self-pay | Admitting: *Deleted

## 2013-01-01 NOTE — Telephone Encounter (Signed)
[name] was scheduled to see Dr. Prescott Gum on 01/07/13 after cardiac clearance with Dr. Johnsie Cancel. Since her last visit, she was in the hospital for a GI bleed.  Dr Johnsie Cancel has cleared her from a cardiac standpoint but feels she needs more time to recuperate before her lung biopsy.  She will call us to reschedule when she has been cleared by her GI MD.  I will relay this to Dr. Prescott Gum

## 2013-01-07 ENCOUNTER — Ambulatory Visit: Payer: Medicare Other | Admitting: Cardiothoracic Surgery

## 2013-03-11 ENCOUNTER — Other Ambulatory Visit: Payer: Self-pay

## 2013-03-11 MED ORDER — CARVEDILOL 12.5 MG PO TABS: 12.5000 mg | ORAL_TABLET | Freq: Two times a day (BID) | ORAL | Status: DC

## 2013-03-11 NOTE — Telephone Encounter (Signed)
OK to refill per Burnett Kanaris

## 2013-03-31 ENCOUNTER — Encounter: Payer: Self-pay | Admitting: Cardiovascular Disease

## 2013-04-02 ENCOUNTER — Encounter: Payer: Self-pay | Admitting: Cardiovascular Disease

## 2013-04-06 ENCOUNTER — Encounter: Payer: Self-pay | Admitting: Cardiovascular Disease

## 2013-06-01 ENCOUNTER — Encounter (INDEPENDENT_AMBULATORY_CARE_PROVIDER_SITE_OTHER): Payer: Medicare Other | Admitting: Ophthalmology

## 2013-06-01 DIAGNOSIS — H43819 Vitreous degeneration, unspecified eye: Secondary | ICD-10-CM

## 2013-06-01 DIAGNOSIS — E1139 Type 2 diabetes mellitus with other diabetic ophthalmic complication: Secondary | ICD-10-CM

## 2013-06-01 DIAGNOSIS — H35039 Hypertensive retinopathy, unspecified eye: Secondary | ICD-10-CM

## 2013-06-01 DIAGNOSIS — E11311 Type 2 diabetes mellitus with unspecified diabetic retinopathy with macular edema: Secondary | ICD-10-CM

## 2013-06-01 DIAGNOSIS — I10 Essential (primary) hypertension: Secondary | ICD-10-CM

## 2013-06-01 DIAGNOSIS — E11359 Type 2 diabetes mellitus with proliferative diabetic retinopathy without macular edema: Secondary | ICD-10-CM

## 2013-06-01 DIAGNOSIS — H4310 Vitreous hemorrhage, unspecified eye: Secondary | ICD-10-CM

## 2013-06-08 ENCOUNTER — Other Ambulatory Visit (INDEPENDENT_AMBULATORY_CARE_PROVIDER_SITE_OTHER): Payer: Medicare Other | Admitting: Ophthalmology

## 2013-06-08 DIAGNOSIS — E11359 Type 2 diabetes mellitus with proliferative diabetic retinopathy without macular edema: Secondary | ICD-10-CM

## 2013-06-08 DIAGNOSIS — H35109 Retinopathy of prematurity, unspecified, unspecified eye: Secondary | ICD-10-CM

## 2013-06-08 DIAGNOSIS — E1139 Type 2 diabetes mellitus with other diabetic ophthalmic complication: Secondary | ICD-10-CM

## 2013-06-29 ENCOUNTER — Encounter (INDEPENDENT_AMBULATORY_CARE_PROVIDER_SITE_OTHER): Payer: Medicare Other | Admitting: Ophthalmology

## 2013-07-02 ENCOUNTER — Encounter (INDEPENDENT_AMBULATORY_CARE_PROVIDER_SITE_OTHER): Payer: Medicare Other | Admitting: Ophthalmology

## 2013-07-02 DIAGNOSIS — E1139 Type 2 diabetes mellitus with other diabetic ophthalmic complication: Secondary | ICD-10-CM

## 2013-07-02 DIAGNOSIS — E11359 Type 2 diabetes mellitus with proliferative diabetic retinopathy without macular edema: Secondary | ICD-10-CM

## 2013-07-07 ENCOUNTER — Other Ambulatory Visit (INDEPENDENT_AMBULATORY_CARE_PROVIDER_SITE_OTHER): Payer: Medicare Other | Admitting: Ophthalmology

## 2013-07-07 DIAGNOSIS — E11359 Type 2 diabetes mellitus with proliferative diabetic retinopathy without macular edema: Secondary | ICD-10-CM

## 2013-07-07 DIAGNOSIS — E1139 Type 2 diabetes mellitus with other diabetic ophthalmic complication: Secondary | ICD-10-CM

## 2013-08-06 ENCOUNTER — Other Ambulatory Visit (INDEPENDENT_AMBULATORY_CARE_PROVIDER_SITE_OTHER): Payer: Medicare Other | Admitting: Ophthalmology

## 2013-08-06 DIAGNOSIS — E11359 Type 2 diabetes mellitus with proliferative diabetic retinopathy without macular edema: Secondary | ICD-10-CM

## 2013-08-06 DIAGNOSIS — E1139 Type 2 diabetes mellitus with other diabetic ophthalmic complication: Secondary | ICD-10-CM

## 2013-09-16 ENCOUNTER — Encounter (INDEPENDENT_AMBULATORY_CARE_PROVIDER_SITE_OTHER): Payer: Medicare Other | Admitting: Ophthalmology

## 2013-09-16 DIAGNOSIS — H35039 Hypertensive retinopathy, unspecified eye: Secondary | ICD-10-CM

## 2013-09-16 DIAGNOSIS — E1139 Type 2 diabetes mellitus with other diabetic ophthalmic complication: Secondary | ICD-10-CM

## 2013-09-16 DIAGNOSIS — H43819 Vitreous degeneration, unspecified eye: Secondary | ICD-10-CM

## 2013-09-16 DIAGNOSIS — E11359 Type 2 diabetes mellitus with proliferative diabetic retinopathy without macular edema: Secondary | ICD-10-CM

## 2013-09-16 DIAGNOSIS — I10 Essential (primary) hypertension: Secondary | ICD-10-CM

## 2013-09-16 DIAGNOSIS — E1165 Type 2 diabetes mellitus with hyperglycemia: Secondary | ICD-10-CM

## 2013-09-17 ENCOUNTER — Other Ambulatory Visit: Payer: Self-pay

## 2013-09-17 MED ORDER — CARVEDILOL 12.5 MG PO TABS: 12.5000 mg | ORAL_TABLET | Freq: Two times a day (BID) | ORAL | Status: DC

## 2013-12-07 ENCOUNTER — Ambulatory Visit (INDEPENDENT_AMBULATORY_CARE_PROVIDER_SITE_OTHER): Payer: Medicare Other | Admitting: Ophthalmology

## 2013-12-07 DIAGNOSIS — E1165 Type 2 diabetes mellitus with hyperglycemia: Secondary | ICD-10-CM

## 2013-12-07 DIAGNOSIS — I10 Essential (primary) hypertension: Secondary | ICD-10-CM

## 2013-12-07 DIAGNOSIS — H43819 Vitreous degeneration, unspecified eye: Secondary | ICD-10-CM

## 2013-12-07 DIAGNOSIS — E1139 Type 2 diabetes mellitus with other diabetic ophthalmic complication: Secondary | ICD-10-CM

## 2013-12-07 DIAGNOSIS — H35039 Hypertensive retinopathy, unspecified eye: Secondary | ICD-10-CM

## 2013-12-07 DIAGNOSIS — E11359 Type 2 diabetes mellitus with proliferative diabetic retinopathy without macular edema: Secondary | ICD-10-CM

## 2013-12-07 DIAGNOSIS — E11311 Type 2 diabetes mellitus with unspecified diabetic retinopathy with macular edema: Secondary | ICD-10-CM

## 2014-01-08 ENCOUNTER — Telehealth: Payer: Self-pay | Admitting: Cardiovascular Disease

## 2014-01-08 MED ORDER — CARVEDILOL 6.25 MG PO TABS: 6.2500 mg | ORAL_TABLET | Freq: Two times a day (BID) | ORAL | Status: DC

## 2014-01-08 NOTE — Telephone Encounter (Signed)
Dr. Shirlee Latch DOD aware and recommends for pt not to take anymore coreg today. She is to start tomorrow to take Coreg 6.25 mg (1/2 tablet) twice a day.
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
## 2014-01-08 NOTE — Telephone Encounter (Signed)
Pt called, because she has been having low BP for several days. Pt took the Coreg 12.5 mg today at 7:00 AM; her BP at 8:15 was 100/50 now at 10:50 Am her BP is 84/52 pt states when she stand she feels dizzy. Pt take Coreg 12.5 mg twice a day.

## 2014-01-12 NOTE — Telephone Encounter (Signed)
Called patient to assess her symptoms; patient states she is feeling better today.  BP last taken at 6:30 pm yesterday - patient reports 101/54, HR 60.  I advised patient that she is overdue for follow-up and since she has been having problems with hypotension that she should come into the office.  Patient agrees to come tomorrow at 0800.

## 2014-01-13 ENCOUNTER — Ambulatory Visit (INDEPENDENT_AMBULATORY_CARE_PROVIDER_SITE_OTHER): Payer: Medicare Other | Admitting: Cardiovascular Disease

## 2014-01-13 ENCOUNTER — Encounter: Payer: Self-pay | Admitting: Cardiovascular Disease

## 2014-01-13 VITALS — BP 126/71 | HR 78 | Ht 60.0 in | Wt 195.0 lb

## 2014-01-13 DIAGNOSIS — I5032 Chronic diastolic (congestive) heart failure: Secondary | ICD-10-CM

## 2014-01-13 DIAGNOSIS — I2510 Atherosclerotic heart disease of native coronary artery without angina pectoris: Secondary | ICD-10-CM

## 2014-01-13 DIAGNOSIS — R079 Chest pain, unspecified: Secondary | ICD-10-CM

## 2014-01-13 DIAGNOSIS — I959 Hypotension, unspecified: Secondary | ICD-10-CM

## 2014-01-13 LAB — BASIC METABOLIC PANEL
BUN: 13 mg/dL (ref 6–23)
CO2: 26 mEq/L (ref 19–32)
Calcium: 9.2 mg/dL (ref 8.4–10.5)
Chloride: 102 mEq/L (ref 96–112)
Creatinine, Ser: 1 mg/dL (ref 0.4–1.2)
GFR: 56.42 mL/min — ABNORMAL LOW (ref >60.00)
Glucose, Bld: 314 mg/dL — ABNORMAL HIGH (ref 70–99)
Potassium: 3.8 mEq/L (ref 3.5–5.1)
Sodium: 136 mEq/L (ref 135–145)

## 2014-01-13 LAB — HEMOGLOBIN A1C: Hemoglobin A1C: 7.6

## 2014-01-13 MED ORDER — FUROSEMIDE 40 MG PO TABS: ORAL_TABLET | ORAL | Status: DC

## 2014-01-13 NOTE — Patient Instructions (Signed)
Decrease Lasix ( furosemide ) to 20 mg daily    Lab work today ( bmet )   Your physician wants you to follow-up in: 6 months You will receive a reminder letter in the mail two months in advance. If you don't receive a letter, please call our office to schedule the follow-up appointment.

## 2014-01-13 NOTE — Progress Notes (Signed)
[name]  Date of Birth  10/03/1933 [facility] [address]    [place], [place]  [address] [phone]  Fax  [phone]  Problem list: 1. Coronary artery disease-status post inferior wall myocardial infarction 2. Diabetes mellitus 3. Obesity 4. Sleep apnea 5. Hypothyroidism 6 hyper lipidemia   History of Present Illness:  80 year old female with a history of coronary artery disease. She has a history of an old inferior wall myocardial infarction. She also has a history of diabetes mellitus and hypothyroidism.   She had a stress Myoview study Oct. 31, 2012  It reveals a large inferior lateral scar with only a small to moderate amount of redistribution and apical and lateral walls.. She has a known tight stenosis of a very small ramus intermediate branch that we are treating medically. I suspect this is responsible for this small amount of apical lateral redistribution.  The amount he seen on Myoview study correlates exactly with what her previous past 5 cardiac catheterizations have shown.  She has done well from a cardiac stanpoint.  Denies any chest pain or dyspnea.   She's not had any episodes of chest pain. She has been exercising on occasion.  She was having some minor bleeding and her medical doctor stopped her ASA.  She is going to have an EGD on Nov. 21.  She would like to go back on the ASA ( as would I)  Dec 29, 2012:  [name] presents today for follow up.  She was just discharged from hospital after (and with weakness and a GI bleed. She remains very weak.  She's had lots of indigestion-like chest pain. No angina or chest pain.  She is now back on ASA.    She was found to have some scarring of her lungs. She was scheduled to have a lung biopsy in the next week or so but she still feels very fatigued and does not think that she's up for the surgery.    Jan 13, 2014:  [name] is seen as a work in visit.  She is seen with her daughter, [name]  today.  She has had some dizziness.  She has had hypotension and quite variable bloodg lucose levels.   Her coreg was decreased to 1/2 the usual dose.  She also cut her Lasix to 20 mg BID.  She has continued to have orthostatic symptoms.    Current Outpatient Prescriptions on File Prior to Visit  Medication Sig Dispense Refill  . atorvastatin (LIPITOR) 20 MG tablet Take 20 mg by mouth daily.       . Calcium Carbonate-Vitamin D (CALCIUM + D PO) Take 1 tablet by mouth daily.       . carvedilol (COREG) 6.25 MG tablet Take 1 tablet (6.25 mg total) by mouth 2 (two) times daily.  60 tablet  5  . diphenoxylate-atropine (LOMOTIL) 2.5-0.025 MG per tablet Take 1 tablet by mouth daily as needed for diarrhea or loose stools.      Marland Kitchen escitalopram (LEXAPRO) 10 MG tablet Take 10 mg by mouth daily as needed (mood.).       Marland Kitchen furosemide (LASIX) 40 MG tablet Take 1 tablet (40 mg total) by mouth daily. Resume in 2 days.  30 tablet  0  . HYDROcodone-acetaminophen (NORCO/VICODIN) 5-325 MG per tablet Take 1 tablet by mouth every 6 (six) hours as needed for pain.      Marland Kitchen insulin aspart (NOVOLOG) 100 UNIT/ML injection Inject 30 Units into the skin  3 (three) times daily before meals.       . insulin glargine (LANTUS) 100 UNIT/ML injection Inject 45-50 Units into the skin See admin instructions. 30 units      . levothyroxine (SYNTHROID, LEVOTHROID) 125 MCG tablet Take 125 mcg by mouth daily.       . Montelukast Sodium (SINGULAIR PO) Take 10 mg by mouth every morning.       . Multiple Vitamin (MULTIVITAMIN WITH MINERALS) TABS Take 1 tablet by mouth daily.      . nitroGLYCERIN (NITROSTAT) 0.4 MG SL tablet Place 0.4 mg under the tongue every 5 (five) minutes as needed. Chest pain      . pantoprazole (PROTONIX) 40 MG tablet Take 1 tablet (40 mg total) by mouth 2 (two) times daily before a meal.  62 tablet  0  . potassium chloride (KLOR-CON) 10 MEQ CR tablet Take 10 mEq by mouth 2 (two) times daily.       . promethazine  (PHENERGAN) 25 MG tablet Take 25 mg by mouth every 4 (four) hours as needed for nausea.       No current facility-administered medications on file prior to visit.    Allergies  Allergen Reactions  . Ace Inhibitors Cough  . Penicillins Hives and Swelling  . Sulfonamide Derivatives Swelling    Past Medical History  Diagnosis Date  . Coronary artery disease     status post inferior wall myocardial infarction  . Diabetes mellitus   . Obesity   . Leg edema   . Sleep apnea   . Hypothyroidism   . Dyslipidemia   . Hypotension 08/02/2010    recent episodes of orthostatic hypotension  . Neuropathy of hand     left hand numbness/neuropathy  . Cirrhosis, non-alcoholic     Past Surgical History  Procedure Laterality Date  . Cardiac catheterization      The ejection fraction is around 50%  . Coronary stent placement    . Cholecystectomy    . Abdominal hysterectomy    . Tubal ligation    . Esophagogastroduodenoscopy N/A 12/12/2012    Procedure: ESOPHAGOGASTRODUODENOSCOPY (EGD);  Surgeon: [name], MD;  Location: [name];  Service: Endoscopy;  Laterality: N/A;  . Flexible sigmoidoscopy N/A 12/12/2012    Procedure: FLEXIBLE SIGMOIDOSCOPY;  Surgeon: [name], MD;  Location: [facility];  Service: Endoscopy;  Laterality: N/A;    History  Smoking status  . Never Smoker   Smokeless tobacco  . Never Used    History  Alcohol Use No    Family History  Problem Relation Age of Onset  . Emphysema Brother     non smoker  . Heart disease Father   . Diabetes Mother     Reviw of Systems:  Reviewed in the HPI.  All other systems are negative.  Physical Exam: BP 126/71  Pulse 78  Ht 5' (1.524 m)  Wt 195 lb (88.451 kg)  BMI 38.08 kg/m2 The patient is alert and oriented x 3.  The mood and affect are normal.   Skin: warm and dry.  Color is normal.   HEENT:   Normocephalic, atraumatic. She is no JVD. Her carotids are normal. Lungs: Clear Heart: Regular rate  S1-S2. There is a soft systolic murmur   Abdomen: Moderately obese. She has good bowel sounds. Extremities:  No clubbing cyanosis or edema Neuro:  Nonfocal. Gait is normal.     ECG: Jan 13, 2014:  NSR at 76 with frequent PVCs.  Old  lateral MI.  Assessment / Plan:

## 2014-01-13 NOTE — Assessment & Plan Note (Signed)
[name] presents with symptoms of orthostatic hypotension.  She has had hyperglycemia for the past several weeks and I suspect this has caused an osmotic diuresis.  She decreased her Coreg and her Lasix ( although she was still taking it 20 BID).    Will decrease her lasix to 20 mg a day.    Encouraged her to continue with healthy diet.  Her EKG today shows frequent premature ventricular contractions. I suspect that she may have hypokalemia.   Check BMP today.  I'll see her again in 6 months.  Will check lipid panel, liver enzymes, and basic metabolic profile at that time.

## 2014-01-13 NOTE — Assessment & Plan Note (Signed)
She's not had any episodes of angina. Continue with her same medications.

## 2014-01-25 ENCOUNTER — Telehealth: Payer: Self-pay | Admitting: Cardiovascular Disease

## 2014-01-25 NOTE — Telephone Encounter (Signed)
Walk In pt Form" [employer]" papers Dropped Off Dr.Nahser back Wednesday

## 2014-01-28 ENCOUNTER — Telehealth: Payer: Self-pay

## 2014-01-28 NOTE — Telephone Encounter (Signed)
lmom. Pt form dropped off,  Dr. Elease Hashimoto has completed his portion anfd form is ready.Marland Kitchen left at the front desk for pt to pick up

## 2014-02-08 ENCOUNTER — Encounter (INDEPENDENT_AMBULATORY_CARE_PROVIDER_SITE_OTHER): Payer: Medicare Other | Admitting: Ophthalmology

## 2014-02-08 DIAGNOSIS — H43819 Vitreous degeneration, unspecified eye: Secondary | ICD-10-CM

## 2014-02-08 DIAGNOSIS — H3581 Retinal edema: Secondary | ICD-10-CM

## 2014-02-08 DIAGNOSIS — I10 Essential (primary) hypertension: Secondary | ICD-10-CM

## 2014-02-08 DIAGNOSIS — E11359 Type 2 diabetes mellitus with proliferative diabetic retinopathy without macular edema: Secondary | ICD-10-CM

## 2014-02-08 DIAGNOSIS — H35039 Hypertensive retinopathy, unspecified eye: Secondary | ICD-10-CM

## 2014-02-08 DIAGNOSIS — E1139 Type 2 diabetes mellitus with other diabetic ophthalmic complication: Secondary | ICD-10-CM

## 2014-02-08 DIAGNOSIS — E1165 Type 2 diabetes mellitus with hyperglycemia: Secondary | ICD-10-CM

## 2014-04-30 ENCOUNTER — Other Ambulatory Visit: Payer: Self-pay | Admitting: Internal Medicine

## 2014-04-30 ENCOUNTER — Ambulatory Visit
Admission: RE | Admit: 2014-04-30 | Discharge: 2014-04-30 | Disposition: A | Discharge status: Home / Self Care | Payer: Medicare Other | Source: Ambulatory Visit | Attending: Internal Medicine | Admitting: Internal Medicine

## 2014-04-30 DIAGNOSIS — M5489 Other dorsalgia: Secondary | ICD-10-CM

## 2014-04-30 DIAGNOSIS — M542 Cervicalgia: Secondary | ICD-10-CM

## 2014-05-06 ENCOUNTER — Other Ambulatory Visit (HOSPITAL_COMMUNITY): Payer: Self-pay | Admitting: Cardiology

## 2014-05-06 DIAGNOSIS — I6529 Occlusion and stenosis of unspecified carotid artery: Secondary | ICD-10-CM

## 2014-05-10 ENCOUNTER — Other Ambulatory Visit: Payer: Self-pay | Admitting: Cardiovascular Disease

## 2014-05-11 ENCOUNTER — Encounter (INDEPENDENT_AMBULATORY_CARE_PROVIDER_SITE_OTHER): Payer: Medicare Other | Admitting: Ophthalmology

## 2014-05-11 DIAGNOSIS — E1165 Type 2 diabetes mellitus with hyperglycemia: Secondary | ICD-10-CM

## 2014-05-11 DIAGNOSIS — E1139 Type 2 diabetes mellitus with other diabetic ophthalmic complication: Secondary | ICD-10-CM

## 2014-05-11 DIAGNOSIS — E11311 Type 2 diabetes mellitus with unspecified diabetic retinopathy with macular edema: Secondary | ICD-10-CM | POA: Diagnosis not present

## 2014-05-11 DIAGNOSIS — H43819 Vitreous degeneration, unspecified eye: Secondary | ICD-10-CM

## 2014-05-11 DIAGNOSIS — I10 Essential (primary) hypertension: Secondary | ICD-10-CM

## 2014-05-11 DIAGNOSIS — E11359 Type 2 diabetes mellitus with proliferative diabetic retinopathy without macular edema: Secondary | ICD-10-CM | POA: Diagnosis not present

## 2014-05-11 DIAGNOSIS — H35039 Hypertensive retinopathy, unspecified eye: Secondary | ICD-10-CM

## 2014-05-13 ENCOUNTER — Ambulatory Visit (HOSPITAL_COMMUNITY): Payer: Medicare Other | Attending: Nurse Practitioner | Admitting: Cardiology

## 2014-05-13 DIAGNOSIS — I2510 Atherosclerotic heart disease of native coronary artery without angina pectoris: Secondary | ICD-10-CM | POA: Diagnosis not present

## 2014-05-13 DIAGNOSIS — J4489 Other specified chronic obstructive pulmonary disease: Secondary | ICD-10-CM | POA: Insufficient documentation

## 2014-05-13 DIAGNOSIS — J449 Chronic obstructive pulmonary disease, unspecified: Secondary | ICD-10-CM | POA: Diagnosis not present

## 2014-05-13 DIAGNOSIS — I6529 Occlusion and stenosis of unspecified carotid artery: Secondary | ICD-10-CM | POA: Diagnosis present

## 2014-05-13 NOTE — Progress Notes (Signed)
Carotid duplex performed 

## 2014-06-17 LAB — HM DIABETES FOOT EXAM: HM Diabetic Foot Exam: NORMAL

## 2014-06-29 LAB — HM DEXA SCAN

## 2014-07-21 ENCOUNTER — Other Ambulatory Visit: Payer: Self-pay | Admitting: Internal Medicine

## 2014-07-21 DIAGNOSIS — R109 Unspecified abdominal pain: Secondary | ICD-10-CM

## 2014-08-17 ENCOUNTER — Other Ambulatory Visit: Payer: Self-pay | Admitting: Internal Medicine

## 2014-08-17 ENCOUNTER — Ambulatory Visit
Admission: RE | Admit: 2014-08-17 | Discharge: 2014-08-17 | Disposition: A | Discharge status: Home / Self Care | Payer: Medicare Other | Source: Ambulatory Visit | Attending: Internal Medicine | Admitting: Internal Medicine

## 2014-08-17 DIAGNOSIS — R042 Hemoptysis: Secondary | ICD-10-CM

## 2014-09-13 ENCOUNTER — Ambulatory Visit (INDEPENDENT_AMBULATORY_CARE_PROVIDER_SITE_OTHER): Payer: Medicare Other | Admitting: Ophthalmology

## 2014-09-24 ENCOUNTER — Ambulatory Visit (INDEPENDENT_AMBULATORY_CARE_PROVIDER_SITE_OTHER): Payer: PPO | Admitting: Cardiovascular Disease

## 2014-09-24 ENCOUNTER — Encounter: Payer: Self-pay | Admitting: Cardiovascular Disease

## 2014-09-24 VITALS — BP 136/70 | HR 62 | Ht 60.0 in | Wt 192.1 lb

## 2014-09-24 DIAGNOSIS — I5032 Chronic diastolic (congestive) heart failure: Secondary | ICD-10-CM

## 2014-09-24 DIAGNOSIS — I2510 Atherosclerotic heart disease of native coronary artery without angina pectoris: Secondary | ICD-10-CM

## 2014-09-24 DIAGNOSIS — D696 Thrombocytopenia, unspecified: Secondary | ICD-10-CM

## 2014-09-24 MED ORDER — LOSARTAN POTASSIUM 25 MG PO TABS: 25.0000 mg | ORAL_TABLET | Freq: Every day | ORAL | Status: DC

## 2014-09-24 NOTE — Patient Instructions (Signed)
Your physician has recommended you make the following change in your medication:  STOP Aspirin START Losartan 25 mg once daily  Your physician recommends that you return for lab work in: 1 month for basic metabolic panel  Your physician wants you to follow-up in: 6 months with Dr. Acie Fredrickson.  You will receive a reminder letter in the mail two months in advance. If you don't receive a letter, please call our office to schedule the follow-up appointment.

## 2014-09-24 NOTE — Progress Notes (Signed)
Cardiology Office Note   Date:  09/24/2014   ID:  [name], DOB Jul 31, 1934, MRN [id number]  PCP:  [name], NP  Cardiologist:   Thayer Headings, MD   Chief Complaint  Patient presents with  . Follow-up    cad   Problem List  1. Coronary artery disease-status post inferior wall myocardial infarction 2. Diabetes mellitus 3. Obesity 4. Sleep apnea 5. Hypothyroidism 6 hyper lipidemia   History of Present Illness: [name] is a 81 y.o. female who presents for follow up of her CAD and CHF. She is s/p stenting many years ago .    Her GI doctor would like for her to stop her ASA because of low platelet count   She has continued to have slow GI bleeds        Past Medical History  Diagnosis Date  . Coronary artery disease     status post inferior wall myocardial infarction  . Diabetes mellitus   . Obesity   . Leg edema   . Sleep apnea   . Hypothyroidism   . Dyslipidemia   . Hypotension 08/02/2010    recent episodes of orthostatic hypotension  . Neuropathy of hand     left hand numbness/neuropathy  . Cirrhosis, non-alcoholic     Past Surgical History  Procedure Laterality Date  . Cardiac catheterization      The ejection fraction is around 50%  . Coronary stent placement    . Cholecystectomy    . Abdominal hysterectomy    . Tubal ligation    . Esophagogastroduodenoscopy N/A 12/12/2012    Procedure: ESOPHAGOGASTRODUODENOSCOPY (EGD);  Surgeon: [name], MD;  Location: [name];  Service: Endoscopy;  Laterality: N/A;  . Flexible sigmoidoscopy N/A 12/12/2012    Procedure: FLEXIBLE SIGMOIDOSCOPY;  Surgeon: [name], MD;  Location: [facility];  Service: Endoscopy;  Laterality: N/A;     Current Outpatient Prescriptions  Medication Sig Dispense Refill  . atorvastatin (LIPITOR) 20 MG tablet Take 20 mg by mouth daily.     . Calcium Carbonate-Vitamin D (CALCIUM + D PO) Take 1 tablet by mouth daily.     . carvedilol (COREG) 12.5 MG  tablet TAKE ONE TABLET BY MOUTH TWICE DAILY 60 tablet 3  . diphenoxylate-atropine (LOMOTIL) 2.5-0.025 MG per tablet Take 1 tablet by mouth daily as needed for diarrhea or loose stools.    Marland Kitchen escitalopram (LEXAPRO) 10 MG tablet Take 10 mg by mouth daily as needed (mood.).     Marland Kitchen furosemide (LASIX) 40 MG tablet Take 1/2 tablet daily 30 tablet 6  . HYDROcodone-acetaminophen (NORCO/VICODIN) 5-325 MG per tablet Take 1 tablet by mouth every 6 (six) hours as needed for pain.    Marland Kitchen insulin aspart (NOVOLOG) 100 UNIT/ML injection Inject 30 Units into the skin 3 (three) times daily before meals.     . insulin glargine (LANTUS) 100 UNIT/ML injection Inject 45-50 Units into the skin See admin instructions. 30 units    . levothyroxine (SYNTHROID, LEVOTHROID) 125 MCG tablet Take 125 mcg by mouth daily.     . Montelukast Sodium (SINGULAIR PO) Take 10 mg by mouth every morning.     . Multiple Vitamin (MULTIVITAMIN WITH MINERALS) TABS Take 1 tablet by mouth daily.    . nitroGLYCERIN (NITROSTAT) 0.4 MG SL tablet Place 0.4 mg under the tongue every 5 (five) minutes as needed. Chest pain    . pantoprazole (PROTONIX) 40 MG tablet Take 1 tablet (40 mg total) by  mouth 2 (two) times daily before a meal. 62 tablet 0  . potassium chloride (KLOR-CON) 10 MEQ CR tablet Take 10 mEq by mouth 2 (two) times daily.     . promethazine (PHENERGAN) 25 MG tablet Take 25 mg by mouth every 4 (four) hours as needed for nausea.     No current facility-administered medications for this visit.    Allergies:   Ace inhibitors; Penicillins; and Sulfonamide derivatives    Social History:  The patient  reports that she has never smoked. She has never used smokeless tobacco. She reports that she does not drink alcohol or use illicit drugs.   Family History:  The patient's family history includes Diabetes in her mother; Emphysema in her brother; Heart disease in her father.    ROS:  Please see the history of present illness.    Review of  Systems: Constitutional:  denies fever, chills, diaphoresis, appetite change and fatigue.  HEENT: denies photophobia, eye pain, redness, hearing loss, ear pain, congestion, sore throat, rhinorrhea, sneezing, neck pain, neck stiffness and tinnitus.  Respiratory: denies SOB, DOE, cough, chest tightness, and wheezing.  Cardiovascular: denies chest pain, palpitations and leg swelling.  Gastrointestinal: denies nausea, vomiting, abdominal pain, diarrhea, constipation, blood in stool.  Genitourinary: denies dysuria, urgency, frequency, hematuria, flank pain and difficulty urinating.  Musculoskeletal: denies  myalgias, back pain, joint swelling, arthralgias and gait problem.   Skin: denies pallor, rash and wound.  Neurological: denies dizziness, seizures, syncope, weakness, light-headedness, numbness and headaches.   Hematological: denies adenopathy, easy bruising, personal or family bleeding history.  Psychiatric/ Behavioral: denies suicidal ideation, mood changes, confusion, nervousness, sleep disturbance and agitation.       All other systems are reviewed and negative.    PHYSICAL EXAM: VS:  BP 136/70 mmHg  Pulse 62  Ht 5' (1.524 m)  Wt 192 lb 1.9 oz (87.145 kg)  BMI 37.52 kg/m2 , BMI Body mass index is 37.52 kg/(m^2). GEN: Well nourished, well developed, in no acute distress HEENT: normal Neck: no JVD, carotid bruits, or masses Cardiac: RRR; no murmurs, rubs, or gallops,no edema  Respiratory:  clear to auscultation bilaterally, normal work of breathing GI: soft, nontender, nondistended, + BS MS: no deformity or atrophy Skin: warm and dry, no rash Neuro:  Strength and sensation are intact Psych: normal   EKG:  EKG is not ordered today.    Recent Labs: 01/13/2014: BUN 13; Creatinine 1.0; Potassium 3.8; Sodium 136    Lipid Panel    Component Value Date/Time   CHOL 147 12/20/2011 0940   TRIG 136.0 12/20/2011 0940   HDL 61.90 12/20/2011 0940   CHOLHDL 2 12/20/2011 0940    VLDL 27.2 12/20/2011 0940   LDLCALC 58 12/20/2011 0940      Wt Readings from Last 3 Encounters:  09/24/14 192 lb 1.9 oz (87.145 kg)  01/13/14 195 lb (88.451 kg)  12/29/12 190 lb 12.8 oz (86.546 kg)      Other studies Reviewed: Additional studies/ records that were reviewed today include: . Review of the above records demonstrates:    ASSESSMENT AND PLAN:  1. Coronary artery disease-status post inferior wall myocardial infarction -[name] is doing well - has no symptoms .  She has developed thrombocytopenia and unfortunately her gastroenterologist has recommended that we stop her aspirin. Her platelet count is down to 60,000.  we will go to stop her aspirin. I have advised her to let us know if she has any episodes of chest pain  2. Diabetes mellitus -  We'll start her on losartan 25 mg a day. We will check a basic medical profile in one month.  3. Obesity - encouraged her to exercise regularly   4. Sleep apnea-   5. Hypothyroidism - followed by her medical doctor   6 hyperlipidemia - will continue current meds.    Current medicines are reviewed at length with the patient today.  The patient does not have concerns regarding medicines.  The following changes have been made:  no change   Disposition:   FU with me in 6 months    Signed, Nahser, Wonda Cheng, MD  09/24/2014 4:42 PM    [facility] [place], [place], [place]  [address] Phone: [phone]; Fax: [phone]

## 2014-09-27 ENCOUNTER — Other Ambulatory Visit: Payer: Self-pay

## 2014-09-27 MED ORDER — CARVEDILOL 12.5 MG PO TABS: 12.5000 mg | ORAL_TABLET | Freq: Two times a day (BID) | ORAL | Status: DC

## 2014-09-28 ENCOUNTER — Telehealth: Payer: Self-pay | Admitting: Cardiovascular Disease

## 2014-09-28 ENCOUNTER — Ambulatory Visit (INDEPENDENT_AMBULATORY_CARE_PROVIDER_SITE_OTHER): Payer: PPO | Admitting: Ophthalmology

## 2014-09-28 DIAGNOSIS — I10 Essential (primary) hypertension: Secondary | ICD-10-CM

## 2014-09-28 DIAGNOSIS — E11311 Type 2 diabetes mellitus with unspecified diabetic retinopathy with macular edema: Secondary | ICD-10-CM

## 2014-09-28 DIAGNOSIS — H35372 Puckering of macula, left eye: Secondary | ICD-10-CM

## 2014-09-28 DIAGNOSIS — H35033 Hypertensive retinopathy, bilateral: Secondary | ICD-10-CM

## 2014-09-28 DIAGNOSIS — E11351 Type 2 diabetes mellitus with proliferative diabetic retinopathy with macular edema: Secondary | ICD-10-CM

## 2014-09-28 DIAGNOSIS — H43813 Vitreous degeneration, bilateral: Secondary | ICD-10-CM

## 2014-09-28 LAB — HM DIABETES EYE EXAM

## 2014-09-28 NOTE — Telephone Encounter (Signed)
New message     Refill carvedilol to [facility]

## 2014-10-04 ENCOUNTER — Other Ambulatory Visit: Payer: Self-pay | Admitting: Nurse Practitioner

## 2014-10-04 ENCOUNTER — Other Ambulatory Visit: Payer: Self-pay

## 2014-10-04 MED ORDER — CARVEDILOL 12.5 MG PO TABS: 12.5000 mg | ORAL_TABLET | Freq: Two times a day (BID) | ORAL | Status: DC

## 2014-10-28 ENCOUNTER — Other Ambulatory Visit: Payer: PPO

## 2014-11-02 ENCOUNTER — Other Ambulatory Visit: Payer: Self-pay | Admitting: Internal Medicine

## 2014-11-02 ENCOUNTER — Ambulatory Visit
Admission: RE | Admit: 2014-11-02 | Discharge: 2014-11-02 | Disposition: A | Discharge status: Home / Self Care | Payer: PPO | Source: Ambulatory Visit | Attending: Internal Medicine | Admitting: Internal Medicine

## 2014-11-02 DIAGNOSIS — J441 Chronic obstructive pulmonary disease with (acute) exacerbation: Secondary | ICD-10-CM

## 2014-11-23 ENCOUNTER — Other Ambulatory Visit (HOSPITAL_COMMUNITY): Payer: Self-pay | Admitting: Radiology

## 2014-11-23 DIAGNOSIS — J449 Chronic obstructive pulmonary disease, unspecified: Secondary | ICD-10-CM

## 2014-11-23 LAB — BASIC METABOLIC PANEL
BUN: 16 mg/dL (ref 4–21)
BUN: 16 mg/dL (ref 4–21)
Creatinine: 1.1 mg/dL (ref 0.5–1.1)
Creatinine: 1.1 mg/dL (ref <=1.1)
Glucose: 323 mg/dL
Glucose: 323 mg/dL
Potassium: 4.3 mmol/L (ref 3.4–5.3)
Potassium: 4.3 mmol/L (ref 3.4–5.3)
Sodium: 136 mmol/L — AB (ref 137–147)
Sodium: 136 mmol/L — AB (ref 137–147)

## 2014-11-23 LAB — HEMOGLOBIN A1C
Hemoglobin A1C: 9.8
Hemoglobin A1C: 9.8

## 2014-11-29 ENCOUNTER — Ambulatory Visit (HOSPITAL_COMMUNITY)
Admission: RE | Admit: 2014-11-29 | Discharge: 2014-11-29 | Disposition: A | Discharge status: Home / Self Care | Payer: PPO | Source: Ambulatory Visit | Attending: Internal Medicine | Admitting: Internal Medicine

## 2014-11-29 DIAGNOSIS — J449 Chronic obstructive pulmonary disease, unspecified: Secondary | ICD-10-CM | POA: Diagnosis present

## 2014-11-29 LAB — PULMONARY FUNCTION TEST
DL/VA % pred: 86 %
DL/VA: 3.65 ml/min/mmHg/L
DLCO unc % pred: 52 %
DLCO unc: 9.88 ml/min/mmHg
FEF 25-75 Pre: 1.66 L/sec
FEF2575-%Pred-Pre: 130 %
FEV1-%Pred-Pre: 96 %
FEV1-Pre: 1.57 L
FEV1FVC-%Pred-Pre: 109 %
FEV6-%Pred-Pre: 93 %
FEV6-Pre: 1.94 L
FEV6FVC-%Pred-Pre: 106 %
FVC-%Pred-Pre: 87 %
FVC-Pre: 1.94 L
Pre FEV1/FVC ratio: 81 %
Pre FEV6/FVC Ratio: 100 %

## 2015-02-04 ENCOUNTER — Other Ambulatory Visit: Payer: Self-pay | Admitting: Cardiovascular Disease

## 2015-03-29 ENCOUNTER — Ambulatory Visit (INDEPENDENT_AMBULATORY_CARE_PROVIDER_SITE_OTHER): Payer: PPO | Admitting: Ophthalmology

## 2015-04-28 ENCOUNTER — Ambulatory Visit (INDEPENDENT_AMBULATORY_CARE_PROVIDER_SITE_OTHER): Payer: PPO | Admitting: Ophthalmology

## 2015-04-28 DIAGNOSIS — E11351 Type 2 diabetes mellitus with proliferative diabetic retinopathy with macular edema: Secondary | ICD-10-CM

## 2015-04-28 DIAGNOSIS — H35372 Puckering of macula, left eye: Secondary | ICD-10-CM | POA: Diagnosis not present

## 2015-04-28 DIAGNOSIS — H43813 Vitreous degeneration, bilateral: Secondary | ICD-10-CM

## 2015-04-28 DIAGNOSIS — H35033 Hypertensive retinopathy, bilateral: Secondary | ICD-10-CM | POA: Diagnosis not present

## 2015-04-28 DIAGNOSIS — I10 Essential (primary) hypertension: Secondary | ICD-10-CM

## 2015-04-28 DIAGNOSIS — E11311 Type 2 diabetes mellitus with unspecified diabetic retinopathy with macular edema: Secondary | ICD-10-CM | POA: Diagnosis not present

## 2015-06-27 ENCOUNTER — Telehealth: Payer: Self-pay | Admitting: Cardiovascular Disease

## 2015-06-27 NOTE — Telephone Encounter (Signed)
Walk in pt form-[employer]-paper dropped off [place] back Tuesday 11/8

## 2015-06-29 ENCOUNTER — Telehealth: Payer: Self-pay | Admitting: Nurse Practitioner

## 2015-06-29 NOTE — Telephone Encounter (Signed)
Left message for patient to call regarding paperwork that was dropped off Monday.  Patient needs an appointment with Dr. Elease HashimotoNahser as she was last seen February 2016 and is due for 6 month follow-up.

## 2015-07-05 NOTE — Telephone Encounter (Signed)
Spoke with patient and advised her that follow-up appointment is overdue and she needs to be seen in order to have paperwork completed.  I scheduled her to see Dr. Acie Fredrickson on Thursday 11/17.  She verbalized understanding and agreement.

## 2015-07-07 ENCOUNTER — Encounter: Payer: Self-pay | Admitting: Cardiovascular Disease

## 2015-07-07 ENCOUNTER — Ambulatory Visit (INDEPENDENT_AMBULATORY_CARE_PROVIDER_SITE_OTHER): Payer: PPO | Admitting: Cardiovascular Disease

## 2015-07-07 VITALS — BP 90/50 | HR 62 | Ht 60.0 in | Wt 186.0 lb

## 2015-07-07 DIAGNOSIS — I6523 Occlusion and stenosis of bilateral carotid arteries: Secondary | ICD-10-CM | POA: Diagnosis not present

## 2015-07-07 DIAGNOSIS — E785 Hyperlipidemia, unspecified: Secondary | ICD-10-CM | POA: Diagnosis not present

## 2015-07-07 DIAGNOSIS — I2510 Atherosclerotic heart disease of native coronary artery without angina pectoris: Secondary | ICD-10-CM

## 2015-07-07 MED ORDER — NITROGLYCERIN 0.4 MG SL SUBL: 0.4000 mg | SUBLINGUAL_TABLET | SUBLINGUAL | Status: DC | PRN

## 2015-07-07 NOTE — Progress Notes (Signed)
Cardiology Office Note   Date:  07/07/2015   ID:  [name], DOB 1934/08/12, MRN [id number]  PCP:  Kelton Pillar Herschell Dimes, MD  Cardiologist:   Thayer Headings, MD   Chief Complaint  Patient presents with  . Follow-up   Problem List  1. Coronary artery disease-status post inferior wall myocardial infarction 2. Diabetes mellitus 3. Obesity 4. Sleep apnea 5. Hypothyroidism 6 hyper lipidemia   History of Present Illness: [name] is a 81 y.o. female who presents for follow up of her CAD and CHF. She is s/p stenting many years ago .    Her GI doctor would like for her to stop her ASA because of low platelet count   She has continued to have slow GI bleeds     07/07/2015 Doing well from a cardiac standpoint.  BP has been low  We stopped ASA at her last visit     Past Medical History  Diagnosis Date  . Coronary artery disease     status post inferior wall myocardial infarction  . Diabetes mellitus   . Obesity   . Leg edema   . Sleep apnea   . Hypothyroidism   . Dyslipidemia   . Hypotension 08/02/2010    recent episodes of orthostatic hypotension  . Neuropathy of hand     left hand numbness/neuropathy  . Cirrhosis, non-alcoholic ([place])     Past Surgical History  Procedure Laterality Date  . Cardiac catheterization      The ejection fraction is around 50%  . Coronary stent placement    . Cholecystectomy    . Abdominal hysterectomy    . Tubal ligation    . Esophagogastroduodenoscopy N/A 12/12/2012    Procedure: ESOPHAGOGASTRODUODENOSCOPY (EGD);  Surgeon: [name], MD;  Location: [name];  Service: Endoscopy;  Laterality: N/A;  . Flexible sigmoidoscopy N/A 12/12/2012    Procedure: FLEXIBLE SIGMOIDOSCOPY;  Surgeon: [name], MD;  Location: [facility];  Service: Endoscopy;  Laterality: N/A;     Current Outpatient Prescriptions  Medication Sig Dispense Refill  . atorvastatin (LIPITOR) 20 MG tablet Take 20 mg by mouth daily.      . carvedilol (COREG) 12.5 MG tablet Take 1 tablet (12.5 mg total) by mouth 2 (two) times daily. 60 tablet 3  . escitalopram (LEXAPRO) 10 MG tablet Take 10 mg by mouth daily as needed (mood.).     Marland Kitchen furosemide (LASIX) 40 MG tablet Take 1/2 tablet daily 30 tablet 6  . insulin aspart (NOVOLOG) 100 UNIT/ML injection Inject 30 Units into the skin 3 (three) times daily before meals.     . insulin glargine (LANTUS) 100 UNIT/ML injection Inject 45-50 Units into the skin See admin instructions. 30 units    . levothyroxine (SYNTHROID, LEVOTHROID) 125 MCG tablet Take 125 mcg by mouth daily.     Marland Kitchen losartan (COZAAR) 25 MG tablet TAKE ONE TABLET BY MOUTH ONCE DAILY 30 tablet 8  . Montelukast Sodium (SINGULAIR PO) Take 10 mg by mouth every morning.     . nitroGLYCERIN (NITROSTAT) 0.4 MG SL tablet Place 0.4 mg under the tongue every 5 (five) minutes as needed. Chest pain    . pantoprazole (PROTONIX) 40 MG tablet Take 1 tablet (40 mg total) by mouth 2 (two) times daily before a meal. 62 tablet 0  . potassium chloride (KLOR-CON) 10 MEQ CR tablet Take 10 mEq by mouth 2 (two) times daily.      No current facility-administered medications for this  visit.    Allergies:   Ace inhibitors; Penicillins; and Sulfonamide derivatives    Social History:  The patient  reports that she has never smoked. She has never used smokeless tobacco. She reports that she does not drink alcohol or use illicit drugs.   Family History:  The patient's family history includes Diabetes in her mother; Emphysema in her brother; Heart disease in her father.    ROS:  Please see the history of present illness.    Review of Systems: Constitutional:  denies fever, chills, diaphoresis, appetite change and fatigue.  HEENT: denies photophobia, eye pain, redness, hearing loss, ear pain, congestion, sore throat, rhinorrhea, sneezing, neck pain, neck stiffness and tinnitus.  Respiratory: denies SOB, DOE, cough, chest tightness, and wheezing.    Cardiovascular: denies chest pain, palpitations and leg swelling.  Gastrointestinal: denies nausea, vomiting, abdominal pain, diarrhea, constipation, blood in stool.  Genitourinary: denies dysuria, urgency, frequency, hematuria, flank pain and difficulty urinating.  Musculoskeletal: denies  myalgias, back pain, joint swelling, arthralgias and gait problem.   Skin: denies pallor, rash and wound.  Neurological: denies dizziness, seizures, syncope, weakness, light-headedness, numbness and headaches.   Hematological: denies adenopathy, easy bruising, personal or family bleeding history.  Psychiatric/ Behavioral: denies suicidal ideation, mood changes, confusion, nervousness, sleep disturbance and agitation.       All other systems are reviewed and negative.    PHYSICAL EXAM: VS:  BP 90/50 mmHg  Pulse 62  Ht 5' (1.524 m)  Wt 186 lb (84.369 kg)  BMI 36.33 kg/m2 , BMI Body mass index is 36.33 kg/(m^2). GEN: Well nourished, well developed, in no acute distress HEENT: normal Neck: no JVD, carotid bruits, or masses Cardiac: RRR; no murmurs, rubs, or gallops,no edema  Respiratory:  clear to auscultation bilaterally, normal work of breathing GI: soft, nontender, nondistended, + BS MS: no deformity or atrophy Skin: warm and dry, no rash Neuro:  Strength and sensation are intact Psych: normal   EKG:  EKG is ordered today. NSR at 62.     Recent Labs: No results found for requested labs within last 365 days.    Lipid Panel    Component Value Date/Time   CHOL 147 12/20/2011 0940   TRIG 136.0 12/20/2011 0940   HDL 61.90 12/20/2011 0940   CHOLHDL 2 12/20/2011 0940   VLDL 27.2 12/20/2011 0940   LDLCALC 58 12/20/2011 0940      Wt Readings from Last 3 Encounters:  07/07/15 186 lb (84.369 kg)  09/24/14 192 lb 1.9 oz (87.145 kg)  01/13/14 195 lb (88.451 kg)      Other studies Reviewed: Additional studies/ records that were reviewed today include: . Review of the above records  demonstrates:    ASSESSMENT AND PLAN:  1. Coronary artery disease-status post inferior wall myocardial infarction -[name] is doing well - has no symptoms .   2. Diabetes mellitus -   3. Obesity - encouraged her to exercise regularly   4. Sleep apnea-   5. Hypothyroidism - followed by her medical doctor   6 hyperlipidemia - will continue current meds.    Current medicines are reviewed at length with the patient today.  The patient does not have concerns regarding medicines.  The following changes have been made:  no change   Disposition:   FU with me in 1 year.     Signed, Leahann Lempke, Wonda Cheng, MD  07/07/2015 11:28 AM    [address], [place], [place]  [address]  Phone: [phone]; Fax: [phone]

## 2015-07-07 NOTE — Patient Instructions (Signed)

## 2015-07-19 ENCOUNTER — Ambulatory Visit (HOSPITAL_COMMUNITY)
Admission: RE | Admit: 2015-07-19 | Discharge: 2015-07-19 | Disposition: A | Discharge status: Home / Self Care | Payer: PPO | Source: Ambulatory Visit | Attending: Cardiovascular Disease | Admitting: Cardiovascular Disease

## 2015-07-19 DIAGNOSIS — I6523 Occlusion and stenosis of bilateral carotid arteries: Secondary | ICD-10-CM | POA: Diagnosis not present

## 2015-07-19 DIAGNOSIS — E119 Type 2 diabetes mellitus without complications: Secondary | ICD-10-CM | POA: Diagnosis not present

## 2015-07-19 DIAGNOSIS — E785 Hyperlipidemia, unspecified: Secondary | ICD-10-CM | POA: Insufficient documentation

## 2015-08-30 DIAGNOSIS — G4733 Obstructive sleep apnea (adult) (pediatric): Secondary | ICD-10-CM | POA: Diagnosis not present

## 2015-09-05 DIAGNOSIS — H109 Unspecified conjunctivitis: Secondary | ICD-10-CM | POA: Diagnosis not present

## 2015-09-05 DIAGNOSIS — H00016 Hordeolum externum left eye, unspecified eyelid: Secondary | ICD-10-CM | POA: Diagnosis not present

## 2015-09-13 ENCOUNTER — Other Ambulatory Visit: Payer: Self-pay | Admitting: Cardiovascular Disease

## 2015-09-30 DIAGNOSIS — G4733 Obstructive sleep apnea (adult) (pediatric): Secondary | ICD-10-CM | POA: Diagnosis not present

## 2015-10-12 DIAGNOSIS — H11423 Conjunctival edema, bilateral: Secondary | ICD-10-CM | POA: Diagnosis not present

## 2015-10-12 DIAGNOSIS — H11153 Pinguecula, bilateral: Secondary | ICD-10-CM | POA: Diagnosis not present

## 2015-10-12 DIAGNOSIS — H5340 Unspecified visual field defects: Secondary | ICD-10-CM | POA: Diagnosis not present

## 2015-10-12 DIAGNOSIS — H40023 Open angle with borderline findings, high risk, bilateral: Secondary | ICD-10-CM | POA: Diagnosis not present

## 2015-10-12 DIAGNOSIS — H18413 Arcus senilis, bilateral: Secondary | ICD-10-CM | POA: Diagnosis not present

## 2015-10-12 DIAGNOSIS — H04123 Dry eye syndrome of bilateral lacrimal glands: Secondary | ICD-10-CM | POA: Diagnosis not present

## 2015-10-12 DIAGNOSIS — Z961 Presence of intraocular lens: Secondary | ICD-10-CM | POA: Diagnosis not present

## 2015-10-12 DIAGNOSIS — E113313 Type 2 diabetes mellitus with moderate nonproliferative diabetic retinopathy with macular edema, bilateral: Secondary | ICD-10-CM | POA: Diagnosis not present

## 2015-10-12 DIAGNOSIS — H18893 Other specified disorders of cornea, bilateral: Secondary | ICD-10-CM | POA: Diagnosis not present

## 2015-10-12 DIAGNOSIS — H5203 Hypermetropia, bilateral: Secondary | ICD-10-CM | POA: Diagnosis not present

## 2015-10-12 DIAGNOSIS — Z9849 Cataract extraction status, unspecified eye: Secondary | ICD-10-CM | POA: Diagnosis not present

## 2015-10-12 DIAGNOSIS — H3589 Other specified retinal disorders: Secondary | ICD-10-CM | POA: Diagnosis not present

## 2015-10-18 DIAGNOSIS — H11153 Pinguecula, bilateral: Secondary | ICD-10-CM | POA: Diagnosis not present

## 2015-10-18 DIAGNOSIS — Z961 Presence of intraocular lens: Secondary | ICD-10-CM | POA: Diagnosis not present

## 2015-10-18 DIAGNOSIS — H11423 Conjunctival edema, bilateral: Secondary | ICD-10-CM | POA: Diagnosis not present

## 2015-10-18 DIAGNOSIS — Z9849 Cataract extraction status, unspecified eye: Secondary | ICD-10-CM | POA: Diagnosis not present

## 2015-10-18 DIAGNOSIS — H04123 Dry eye syndrome of bilateral lacrimal glands: Secondary | ICD-10-CM | POA: Diagnosis not present

## 2015-10-18 DIAGNOSIS — H16143 Punctate keratitis, bilateral: Secondary | ICD-10-CM | POA: Diagnosis not present

## 2015-10-18 DIAGNOSIS — H18413 Arcus senilis, bilateral: Secondary | ICD-10-CM | POA: Diagnosis not present

## 2015-10-26 ENCOUNTER — Ambulatory Visit (INDEPENDENT_AMBULATORY_CARE_PROVIDER_SITE_OTHER): Payer: PPO | Admitting: Ophthalmology

## 2015-10-26 DIAGNOSIS — E11311 Type 2 diabetes mellitus with unspecified diabetic retinopathy with macular edema: Secondary | ICD-10-CM | POA: Diagnosis not present

## 2015-10-26 DIAGNOSIS — E113513 Type 2 diabetes mellitus with proliferative diabetic retinopathy with macular edema, bilateral: Secondary | ICD-10-CM | POA: Diagnosis not present

## 2015-10-26 DIAGNOSIS — H35033 Hypertensive retinopathy, bilateral: Secondary | ICD-10-CM | POA: Diagnosis not present

## 2015-10-26 DIAGNOSIS — H43813 Vitreous degeneration, bilateral: Secondary | ICD-10-CM

## 2015-10-26 DIAGNOSIS — I10 Essential (primary) hypertension: Secondary | ICD-10-CM | POA: Diagnosis not present

## 2015-10-28 DIAGNOSIS — G4733 Obstructive sleep apnea (adult) (pediatric): Secondary | ICD-10-CM | POA: Diagnosis not present

## 2015-11-21 DIAGNOSIS — J209 Acute bronchitis, unspecified: Secondary | ICD-10-CM | POA: Diagnosis not present

## 2015-11-21 DIAGNOSIS — R52 Pain, unspecified: Secondary | ICD-10-CM | POA: Diagnosis not present

## 2015-11-28 DIAGNOSIS — G4733 Obstructive sleep apnea (adult) (pediatric): Secondary | ICD-10-CM | POA: Diagnosis not present

## 2015-12-28 DIAGNOSIS — G4733 Obstructive sleep apnea (adult) (pediatric): Secondary | ICD-10-CM | POA: Diagnosis not present

## 2016-01-02 DIAGNOSIS — S39012A Strain of muscle, fascia and tendon of lower back, initial encounter: Secondary | ICD-10-CM | POA: Diagnosis not present

## 2016-01-06 ENCOUNTER — Emergency Department (HOSPITAL_COMMUNITY): Payer: PPO

## 2016-01-06 ENCOUNTER — Emergency Department (HOSPITAL_COMMUNITY)
Admission: EM | Admit: 2016-01-06 | Discharge: 2016-01-06 | Disposition: A | Discharge status: Home / Self Care | Payer: PPO | Attending: Emergency Medicine | Admitting: Emergency Medicine

## 2016-01-06 ENCOUNTER — Encounter (HOSPITAL_COMMUNITY): Payer: Self-pay | Admitting: Emergency Medicine

## 2016-01-06 DIAGNOSIS — S52572A Other intraarticular fracture of lower end of left radius, initial encounter for closed fracture: Secondary | ICD-10-CM | POA: Diagnosis not present

## 2016-01-06 DIAGNOSIS — S62102A Fracture of unspecified carpal bone, left wrist, initial encounter for closed fracture: Secondary | ICD-10-CM

## 2016-01-06 DIAGNOSIS — S52352A Displaced comminuted fracture of shaft of radius, left arm, initial encounter for closed fracture: Secondary | ICD-10-CM | POA: Insufficient documentation

## 2016-01-06 DIAGNOSIS — I2510 Atherosclerotic heart disease of native coronary artery without angina pectoris: Secondary | ICD-10-CM | POA: Insufficient documentation

## 2016-01-06 DIAGNOSIS — Z79899 Other long term (current) drug therapy: Secondary | ICD-10-CM | POA: Diagnosis not present

## 2016-01-06 DIAGNOSIS — S5292XA Unspecified fracture of left forearm, initial encounter for closed fracture: Secondary | ICD-10-CM

## 2016-01-06 DIAGNOSIS — E669 Obesity, unspecified: Secondary | ICD-10-CM | POA: Insufficient documentation

## 2016-01-06 DIAGNOSIS — M545 Low back pain, unspecified: Secondary | ICD-10-CM

## 2016-01-06 DIAGNOSIS — W1830XA Fall on same level, unspecified, initial encounter: Secondary | ICD-10-CM | POA: Insufficient documentation

## 2016-01-06 DIAGNOSIS — Y92190 Kitchen in other specified residential institution as the place of occurrence of the external cause: Secondary | ICD-10-CM | POA: Diagnosis not present

## 2016-01-06 DIAGNOSIS — Z7951 Long term (current) use of inhaled steroids: Secondary | ICD-10-CM | POA: Insufficient documentation

## 2016-01-06 DIAGNOSIS — Y9301 Activity, walking, marching and hiking: Secondary | ICD-10-CM | POA: Diagnosis not present

## 2016-01-06 DIAGNOSIS — S52592A Other fractures of lower end of left radius, initial encounter for closed fracture: Secondary | ICD-10-CM | POA: Diagnosis not present

## 2016-01-06 DIAGNOSIS — E039 Hypothyroidism, unspecified: Secondary | ICD-10-CM | POA: Insufficient documentation

## 2016-01-06 DIAGNOSIS — Z794 Long term (current) use of insulin: Secondary | ICD-10-CM | POA: Insufficient documentation

## 2016-01-06 DIAGNOSIS — S6992XA Unspecified injury of left wrist, hand and finger(s), initial encounter: Secondary | ICD-10-CM | POA: Diagnosis present

## 2016-01-06 DIAGNOSIS — Y999 Unspecified external cause status: Secondary | ICD-10-CM | POA: Diagnosis not present

## 2016-01-06 DIAGNOSIS — E119 Type 2 diabetes mellitus without complications: Secondary | ICD-10-CM | POA: Insufficient documentation

## 2016-01-06 DIAGNOSIS — S3992XA Unspecified injury of lower back, initial encounter: Secondary | ICD-10-CM | POA: Diagnosis not present

## 2016-01-06 LAB — BASIC METABOLIC PANEL
Anion gap: 10 (ref 5–15)
BUN: 16 mg/dL (ref 6–20)
CO2: 24 mmol/L (ref 22–32)
Calcium: 8.9 mg/dL (ref 8.9–10.3)
Chloride: 99 mmol/L — ABNORMAL LOW (ref 101–111)
Creatinine, Ser: 1.11 mg/dL — ABNORMAL HIGH (ref 0.44–1.00)
GFR calc Af Amer: 53 mL/min — ABNORMAL LOW (ref >60)
GFR calc non Af Amer: 46 mL/min — ABNORMAL LOW (ref >60)
Glucose, Bld: 335 mg/dL — ABNORMAL HIGH (ref 65–99)
Potassium: 4.3 mmol/L (ref 3.5–5.1)
Sodium: 133 mmol/L — ABNORMAL LOW (ref 135–145)

## 2016-01-06 LAB — URINALYSIS, ROUTINE W REFLEX MICROSCOPIC
Bilirubin Urine: NEGATIVE
Glucose, UA: >1000 mg/dL — AB
Hgb urine dipstick: NEGATIVE
Ketones, ur: NEGATIVE mg/dL
Leukocytes, UA: NEGATIVE
Nitrite: NEGATIVE
Protein, ur: NEGATIVE mg/dL
Specific Gravity, Urine: 1.013 (ref 1.005–1.030)
pH: 6 (ref 5.0–8.0)

## 2016-01-06 LAB — CBC
HCT: 41.2 % (ref 36.0–46.0)
Hemoglobin: 13.4 g/dL (ref 12.0–15.0)
MCH: 27 pg (ref 26.0–34.0)
MCHC: 32.5 g/dL (ref 30.0–36.0)
MCV: 82.9 fL (ref 78.0–100.0)
Platelets: 59 10*3/uL — ABNORMAL LOW (ref 150–400)
RBC: 4.97 MIL/uL (ref 3.87–5.11)
RDW: 15.5 % (ref 11.5–15.5)
WBC: 3.7 10*3/uL — ABNORMAL LOW (ref 4.0–10.5)

## 2016-01-06 LAB — URINE MICROSCOPIC-ADD ON

## 2016-01-06 LAB — CBG MONITORING, ED: Glucose-Capillary: 321 mg/dL — ABNORMAL HIGH (ref 65–99)

## 2016-01-06 MED ORDER — METHOCARBAMOL 500 MG PO TABS: 500.0000 mg | ORAL_TABLET | Freq: Two times a day (BID) | ORAL | Status: DC

## 2016-01-06 MED ORDER — FENTANYL CITRATE (PF) 100 MCG/2ML IJ SOLN
50.0000 ug | Freq: Once | INTRAMUSCULAR | Status: AC
  Administered 2016-01-06: 50 ug via INTRAVENOUS
  Filled 2016-01-06: qty 2

## 2016-01-06 MED ORDER — BUPIVACAINE HCL (PF) 0.5 % IJ SOLN
20.0000 mL | Freq: Once | INTRAMUSCULAR | Status: AC
  Administered 2016-01-06: 20 mL
  Filled 2016-01-06: qty 30

## 2016-01-06 MED ORDER — MORPHINE SULFATE (PF) 4 MG/ML IV SOLN
4.0000 mg | INTRAVENOUS | Status: DC | PRN
  Administered 2016-01-06: 4 mg via INTRAVENOUS
  Filled 2016-01-06: qty 1

## 2016-01-06 MED ORDER — ONDANSETRON HCL 4 MG/2ML IJ SOLN
4.0000 mg | Freq: Once | INTRAMUSCULAR | Status: AC
  Administered 2016-01-06: 4 mg via INTRAVENOUS
  Filled 2016-01-06: qty 2

## 2016-01-06 MED ORDER — HYDROCODONE-ACETAMINOPHEN 5-325 MG PO TABS: 2.0000 | ORAL_TABLET | ORAL | Status: DC | PRN

## 2016-01-06 NOTE — Progress Notes (Signed)
ED CM went to see pt but EDP, Fayrene FearingJames evaluating pt

## 2016-01-06 NOTE — Progress Notes (Signed)
Pt and two female family members at bedside. CM reviewed in details medicare guidelines, Choices of home health [facility]) (length of stay in home, types of [facility] staff available, coverage, primary caregiver, up to 24 hrs before services may be started) and choices of Private duty nursing (PDN-coverage, length of stay in the home types of staff available). CM reviewed availability of [facility] to assist pcp to get pt to snf (if desired disposition) from the community level. CM provided pt/family with a list of [place] home health agencies and PDN.  Daughter states she works at a [employer] and will be assisting pt with making choice of home health agencies  Choice pending  ED PM CM updated  Pt with left upper extremity cast intact

## 2016-01-06 NOTE — Discharge Instructions (Signed)
Call Dr. Izora Ribas Monday morning for a appointment Monday or Tuesday. Wrist will require additional treatment, including possible surgery, or additional manipulation and reduction. Stop tizanidine. Vicodin for pain. Robaxin for back pain or spasms. Sedation and fall precautions when starting any new medications.   Back Pain, Adult Back pain is very common in adults.The cause of back pain is rarely dangerous and the pain often gets better over time.The cause of your back pain may not be known. Some common causes of back pain include:  Strain of the muscles or ligaments supporting the spine.  Wear and tear (degeneration) of the spinal disks.  Arthritis.  Direct injury to the back. For many people, back pain may return. Since back pain is rarely dangerous, most people can learn to manage this condition on their own. HOME CARE INSTRUCTIONS Watch your back pain for any changes. The following actions may help to lessen any discomfort you are feeling:  Remain active. It is stressful on your back to sit or stand in one place for long periods of time. Do not sit, drive, or stand in one place for more than 30 minutes at a time. Take short walks on even surfaces as soon as you are able.Try to increase the length of time you walk each day.  Exercise regularly as directed by your health care provider. Exercise helps your back heal faster. It also helps avoid future injury by keeping your muscles strong and flexible.  Do not stay in bed.Resting more than 1-2 days can delay your recovery.  Pay attention to your body when you bend and lift. The most comfortable positions are those that put less stress on your recovering back. Always use proper lifting techniques, including:  Bending your knees.  Keeping the load close to your body.  Avoiding twisting.  Find a comfortable position to sleep. Use a firm mattress and lie on your side with your knees slightly bent. If you lie on your back, put a  pillow under your knees.  Avoid feeling anxious or stressed.Stress increases muscle tension and can worsen back pain.It is important to recognize when you are anxious or stressed and learn ways to manage it, such as with exercise.  Take medicines only as directed by your health care provider. Over-the-counter medicines to reduce pain and inflammation are often the most helpful.Your health care provider may prescribe muscle relaxant drugs.These medicines help dull your pain so you can more quickly return to your normal activities and healthy exercise.  Apply ice to the injured area:  Put ice in a plastic bag.  Place a towel between your skin and the bag.  Leave the ice on for 20 minutes, 2-3 times a day for the first 2-3 days. After that, ice and heat may be alternated to reduce pain and spasms.  Maintain a healthy weight. Excess weight puts extra stress on your back and makes it difficult to maintain good posture. SEEK MEDICAL CARE IF:  You have pain that is not relieved with rest or medicine.  You have increasing pain going down into the legs or buttocks.  You have pain that does not improve in one week.  You have night pain.  You lose weight.  You have a fever or chills. SEEK IMMEDIATE MEDICAL CARE IF:   You develop new bowel or bladder control problems.  You have unusual weakness or numbness in your arms or legs.  You develop nausea or vomiting.  You develop abdominal pain.  You feel faint.  This information is not intended to replace advice given to you by your health care provider. Make sure you discuss any questions you have with your health care provider.   Document Released: 08/06/2005 Document Revised: 08/27/2014 Document Reviewed: 12/08/2013 Elsevier Interactive Patient Education 2016 [employer]  Wrist Fracture A wrist fracture is a break or crack in one of the bones of your wrist. Your wrist is made up of eight small bones at the palm of your hand  (carpal bones) and two long bones that make up your forearm (radius and ulna). CAUSES  A direct blow to the wrist.  Falling on an outstretched hand.  Trauma, such as a car accident or a fall. RISK FACTORS Risk factors for wrist fracture include:  Participating in contact and high-risk sports, such as skiing, biking, and ice skating.  Taking steroid medicines.  Smoking.  Being female.  Being Caucasian.  Drinking more than three alcoholic beverages per day.  Having low or lowered bone density (osteoporosis or osteopenia).  Age. Older adults have decreased bone density.  Women who have had menopause.  History of previous fractures. SIGNS AND SYMPTOMS Symptoms of wrist fractures include tenderness, bruising, and inflammation. Additionally, the wrist may hang in an odd position or appear deformed. DIAGNOSIS Diagnosis may include:  Physical exam.  X-ray. TREATMENT Treatment depends on many factors, including the nature and location of the fracture, your age, and your activity level. Treatment for wrist fracture can be nonsurgical or surgical. Nonsurgical Treatment A plaster cast or splint may be applied to your wrist if the bone is in a good position. If the fracture is not in good position, it may be necessary for your health care provider to realign it before applying a splint or cast. Usually, a cast or splint will be worn for several weeks. Surgical Treatment Sometimes the position of the bone is so far out of place that surgery is required to apply a device to hold it together as it heals. Depending on the fracture, there are a number of options for holding the bone in place while it heals, such as a cast and metal pins. HOME CARE INSTRUCTIONS  Keep your injured wrist elevated and move your fingers as much as possible.  Do not put pressure on any part of your cast or splint. It may break.  Use a plastic bag to protect your cast or splint from water while bathing or  showering. Do not lower your cast or splint into water.  Take medicines only as directed by your health care provider.  Keep your cast or splint clean and dry. If it becomes wet, damaged, or suddenly feels too tight, contact your health care provider right away.  Do not use any tobacco products including cigarettes, chewing tobacco, or electronic cigarettes. Tobacco can delay bone healing. If you need help quitting, ask your health care provider.  Keep all follow-up visits as directed by your health care provider. This is important.  Ask your health care provider if you should take supplements of calcium and vitamins C and D to promote bone healing. SEEK MEDICAL CARE IF:  Your cast or splint is damaged, breaks, or gets wet.  You have a fever.  You have chills.  You have continued severe pain or more swelling than you did before the cast was put on. SEEK IMMEDIATE MEDICAL CARE IF:  Your hand or fingernails on the injured arm turn blue or gray, or feel cold or numb.  You have decreased feeling  in the fingers of your injured arm. MAKE SURE YOU:  Understand these instructions.  Will watch your condition.  Will get help right away if you are not doing well or get worse.   This information is not intended to replace advice given to you by your health care provider. Make sure you discuss any questions you have with your health care provider.   Document Released: 05/16/2005 Document Revised: 04/27/2015 Document Reviewed: 08/24/2011 Elsevier Interactive Patient Education [employer]

## 2016-01-06 NOTE — ED Notes (Signed)
Started zanaflex recently, took two tablets today, got up to get some water and passed out on the kitchen floor. Unknown if hit head. Obvious deformity to left wrist, fingers warm, able to move slightly. Unable to tell if there is pain elsewhere in arm d/t how bad it hurts

## 2016-01-06 NOTE — ED Provider Notes (Addendum)
CSN: [id number]     Arrival date & time 01/06/16  1210 History   First MD Initiated Contact with Patient 01/06/16 1227     Chief Complaint  Patient presents with  . Loss of Consciousness  . Wrist Injury     HPI  Patient presents for evaluation of a fall and wrist injury.  Patient had back pain for the last 4-6 weeks. Lumbar nature. Seen by primary care physician and placed on tizanidine. She's taken this 2 tablets twice a day for the last 4 days. She states that it makes her feel "sleepy" and somewhat dizzy. This morning she took her dose. She was walking through her house. She became lightheaded and she fell forward. She did not have complete loss of consciousness but states that it took her a bit to gather herself. She has pain and swelling her left wrist. Does not have any worsening pain in her back since her fall, but her back pain does persist. As well localized mid back lumbar pain without radiation. No bowel or bladder changes. In her fall today did not strike her head. No other areas of pain concern or injury.  Past Medical History  Diagnosis Date  . Coronary artery disease     status post inferior wall myocardial infarction  . Diabetes mellitus   . Obesity   . Leg edema   . Sleep apnea   . Hypothyroidism   . Dyslipidemia   . Hypotension 08/02/2010    recent episodes of orthostatic hypotension  . Neuropathy of hand     left hand numbness/neuropathy  . Cirrhosis, non-alcoholic ([facility])    Past Surgical History  Procedure Laterality Date  . Cardiac catheterization      The ejection fraction is around 50%  . Coronary stent placement    . Cholecystectomy    . Abdominal hysterectomy    . Tubal ligation    . Esophagogastroduodenoscopy N/A 12/12/2012    Procedure: ESOPHAGOGASTRODUODENOSCOPY (EGD);  Surgeon: Florencia Reasonsobert V Buccini, MD;  Location: [facility];  Service: Endoscopy;  Laterality: N/A;  . Flexible sigmoidoscopy N/A 12/12/2012    Procedure: FLEXIBLE SIGMOIDOSCOPY;  Surgeon:  Florencia Reasonsobert V Buccini, MD;  Location: [facility];  Service: Endoscopy;  Laterality: N/A;   Family History  Problem Relation Age of Onset  . Emphysema Brother     non smoker  . Heart disease Father   . Diabetes Mother    Social History  Substance Use Topics  . Smoking status: Never Smoker   . Smokeless tobacco: Never Used  . Alcohol Use: No   OB History    No data available     Review of Systems  Constitutional: Negative for fever, chills, diaphoresis, appetite change and fatigue.  HENT: Negative for mouth sores, sore throat and trouble swallowing.   Eyes: Negative for visual disturbance.  Respiratory: Negative for cough, chest tightness, shortness of breath and wheezing.   Cardiovascular: Negative for chest pain.  Gastrointestinal: Negative for nausea, vomiting, abdominal pain, diarrhea and abdominal distention.  Endocrine: Negative for polydipsia, polyphagia and polyuria.  Genitourinary: Negative for dysuria, frequency and hematuria.  Musculoskeletal: Positive for back pain. Negative for gait problem.       Wrist pain and deformity  Skin: Negative for color change, pallor and rash.  Neurological: Negative for dizziness, syncope, light-headedness and headaches.  Hematological: Does not bruise/bleed easily.  Psychiatric/Behavioral: Negative for behavioral problems and confusion.      Allergies  Ace inhibitors; Penicillins; and Sulfonamide derivatives  Home Medications  Prior to Admission medications   Medication Sig Start Date End Date Taking? Authorizing Provider  ADVAIR DISKUS 250-50 MCG/DOSE AEPB Inhale 2 puffs into the lungs 2 (two) times daily as needed. Shortness of breath/wheezing 12/29/15  Yes Historical Provider, MD  albuterol (PROVENTIL HFA;VENTOLIN HFA) 108 (90 Base) MCG/ACT inhaler Inhale 2 puffs into the lungs every 6 (six) hours as needed for wheezing or shortness of breath.   Yes Historical Provider, MD  atorvastatin (LIPITOR) 20 MG tablet Take 20 mg by mouth  daily.    Yes Historical Provider, MD  carvedilol (COREG) 12.5 MG tablet Take 1 tablet (12.5 mg total) by mouth 2 (two) times daily. 10/04/14  Yes Rosalio Macadamia, NP  cycloSPORINE (RESTASIS) 0.05 % ophthalmic emulsion Place 1 drop into both eyes 2 (two) times daily.   Yes Historical Provider, MD  escitalopram (LEXAPRO) 10 MG tablet Take 10 mg by mouth daily.    Yes Historical Provider, MD  furosemide (LASIX) 40 MG tablet Take 1/2 tablet daily Patient taking differently: Take 20 mg by mouth daily. Take 1/2 tablet daily 01/13/14  Yes Vesta Mixer, MD  insulin aspart (NOVOLOG) 100 UNIT/ML injection Inject 20 Units into the skin 3 (three) times daily before meals.    Yes Historical Provider, MD  insulin glargine (LANTUS) 100 UNIT/ML injection Inject 64 Units into the skin at bedtime.    Yes Historical Provider, MD  levothyroxine (SYNTHROID, LEVOTHROID) 112 MCG tablet Take 112 mcg by mouth daily before breakfast.   Yes Historical Provider, MD  losartan (COZAAR) 25 MG tablet TAKE 1 TABLET BY MOUTH EVERY DAY 09/14/15  Yes Vesta Mixer, MD  montelukast (SINGULAIR) 10 MG tablet Take 10 mg by mouth daily.   Yes Historical Provider, MD  nitroGLYCERIN (NITROSTAT) 0.4 MG SL tablet Place 1 tablet (0.4 mg total) under the tongue every 5 (five) minutes as needed. Chest pain 07/07/15  Yes Vesta Mixer, MD  pantoprazole (PROTONIX) 40 MG tablet Take 1 tablet (40 mg total) by mouth 2 (two) times daily before a meal. 12/16/12  Yes Rodolph Bong, MD  potassium chloride (KLOR-CON) 10 MEQ CR tablet Take 10 mEq by mouth 2 (two) times daily.    Yes Historical Provider, MD  tiotropium (SPIRIVA) 18 MCG inhalation capsule Place 18 mcg into inhaler and inhale daily.   Yes Historical Provider, MD  tiZANidine (ZANAFLEX) 2 MG tablet Take 4 mg by mouth 2 (two) times daily.   Yes Historical Provider, MD  White Petrolatum-Mineral Oil (SYSTANE NIGHTTIME) OINT Place 1 application into both eyes 4 (four) times daily as needed  (irritation).   Yes Historical Provider, MD   BP 100/49 mmHg  Pulse 55  Temp(Src) 97.8 F (36.6 C) (Oral)  Resp 18  SpO2 95% Physical Exam  Constitutional: She is oriented to person, place, and time. She appears well-developed and well-nourished. No distress.  HENT:  Head: Normocephalic.  Eyes: Conjunctivae are normal. Pupils are equal, round, and reactive to light. No scleral icterus.  Neck: Normal range of motion. Neck supple. No thyromegaly present.  Cardiovascular: Normal rate and regular rhythm.  Exam reveals no gallop and no friction rub.   No murmur heard. Pulmonary/Chest: Effort normal and breath sounds normal. No respiratory distress. She has no wheezes. She has no rales.  Abdominal: Soft. Bowel sounds are normal. She exhibits no distension. There is no tenderness. There is no rebound.  Musculoskeletal: Normal range of motion.       Back:  Arms: Neurological: She is alert and oriented to person, place, and time.  Normal sensations bilateral tremors. Denies any radicular pattern of pain. Normal strength sensation including plantar flexion dorsiflexion. Normal proprioception. Normal Babinski  Skin: Skin is warm and dry. No rash noted.  Psychiatric: She has a normal mood and affect. Her behavior is normal.    ED Course  Reduction of fracture Date/Time: 01/06/2016 3:00 PM Performed by: Rolland Porter Authorized by: Rolland Porter Consent: Verbal consent obtained. Written consent obtained. Risks and benefits: risks, benefits and alternatives were discussed Consent given by: patient Patient understanding: patient states understanding of the procedure being performed Required items: required blood products, implants, devices, and special equipment available Patient identity confirmed: verbally with patient Time out: Immediately prior to procedure a "time out" was called to verify the correct patient, procedure, equipment, support staff and site/side marked as required. Local  anesthesia used: yes Anesthesia: hematoma block Local anesthetic: bupivacaine 0.5% without epinephrine Anesthetic total: 8 ml Patient sedated: no Patient tolerance: Patient tolerated the procedure well with no immediate complications Comments: Reduction of the axial traction, recreational injury, and gentle volar reduction.    (including critical care time) Labs Review Labs Reviewed  BASIC METABOLIC PANEL - Abnormal; Notable for the following:    Sodium 133 (*)    Chloride 99 (*)    Glucose, Bld 335 (*)    Creatinine, Ser 1.11 (*)    GFR calc non Af Amer 46 (*)    GFR calc Af Amer 53 (*)    All other components within normal limits  CBC - Abnormal; Notable for the following:    WBC 3.7 (*)    Platelets 59 (*)    All other components within normal limits  URINALYSIS, ROUTINE W REFLEX MICROSCOPIC (NOT AT [facility]) - Abnormal; Notable for the following:    Glucose, UA >1000 (*)    All other components within normal limits  URINE MICROSCOPIC-ADD ON - Abnormal; Notable for the following:    Squamous Epithelial / LPF 0-5 (*)    Bacteria, UA RARE (*)    All other components within normal limits  CBG MONITORING, ED - Abnormal; Notable for the following:    Glucose-Capillary 321 (*)    All other components within normal limits    Imaging Review Dg Lumbar Spine Complete  01/06/2016  CLINICAL DATA:  Syncope and fall today, right lower back pain. Chronic back pain. EXAM: LUMBAR SPINE - COMPLETE 4+ VIEW COMPARISON:  CT abdomen dated 10/30/2010. FINDINGS: Degenerative changes are now seen throughout the lumbar spine, at least moderate in degree, advanced since previous CT with disc space narrowings and osteophyte throughout. Based on the oblique projections, there are probable osseous neural foramen narrowings at multiple levels with possible associated nerve root impingements. Osseous alignment is stable. No acute or suspicious osseous lesions seen. No fracture line or displaced fracture  fragment. No compression fracture deformity. Upper sacrum appears intact and normally aligned. Atherosclerotic changes are seen along the walls of the infrarenal abdominal aorta. Paravertebral soft tissues are otherwise unremarkable. IMPRESSION: 1. Degenerative changes of the lumbar spine, as detailed above, progressed since previous CT of 2012. Suspect associated nerve root impingement at 1 or more levels. If any associated radiculopathic symptoms, would consider nonemergent MRI for further characterization. 2. Overall alignment of the lumbar spine is stable. No acute findings. Electronically Signed   By: Bary Richard M.D.   On: 01/06/2016 13:48   Dg Wrist Complete Left  01/06/2016  CLINICAL DATA:  Passed out and fell at home today, pain in LEFT wrist low back, wrist deformity, initial encounter EXAM: LEFT WRIST - COMPLETE 3+ VIEW COMPARISON:  None FINDINGS: Osseous demineralization. Comminuted displaced distal LEFT radial metaphyseal fracture with intra-articular extension at the radiocarpal joint and suspect the distal radioulnar joint as well. Apex volar angulation. Acquired ulnar plus variance. Degenerative changes at first [facility] joint and at STT joint. No additional fracture, dislocation, or bone destruction. IMPRESSION: Comminuted displaced angulated intra-articular fracture distal LEFT radial metaphysis as above. Osseous demineralization with degenerative changes at radial border of LEFT carpus. Electronically Signed   By: Ulyses Southward M.D.   On: 01/06/2016 13:45   I have personally reviewed and evaluated these images and lab results as part of my medical decision-making.   EKG Interpretation None      MDM   Final diagnoses:  Radius fracture, left, closed, initial encounter  Midline low back pain without sciatica    Fracture noted. EKG and remainder of her studies show no acute abnormalities. Of note has a normal QTC. Tizanidine is not a QT prolonging medication.  Hematoma block was  performed with 8 mL of Marcaine 0.5 and excellent analgesia. Patient given 50 mics IV fentanyl. Before reduction.  16:00: Near anatomic position noted after reduction. However, difficult to maintain in splint. Post procedure shows excellent pain control. Post procedure x-rays/post reduction show minimal reduction. Plan will be splint, patient follow-up with Dr. Izora Ribas.       Rolland Porter, MD 01/06/16 1601  Rolland Porter, MD 01/06/16 [phone]

## 2016-01-09 DIAGNOSIS — S52532A Colles' fracture of left radius, initial encounter for closed fracture: Secondary | ICD-10-CM | POA: Diagnosis not present

## 2016-01-10 DIAGNOSIS — S52509A Unspecified fracture of the lower end of unspecified radius, initial encounter for closed fracture: Secondary | ICD-10-CM | POA: Diagnosis not present

## 2016-01-10 DIAGNOSIS — S52532A Colles' fracture of left radius, initial encounter for closed fracture: Secondary | ICD-10-CM | POA: Diagnosis not present

## 2016-01-13 ENCOUNTER — Emergency Department (HOSPITAL_COMMUNITY): Payer: PPO

## 2016-01-13 ENCOUNTER — Emergency Department (HOSPITAL_COMMUNITY)
Admission: EM | Admit: 2016-01-13 | Discharge: 2016-01-13 | Disposition: A | Discharge status: Home / Self Care | Payer: PPO | Attending: Emergency Medicine | Admitting: Emergency Medicine

## 2016-01-13 DIAGNOSIS — E669 Obesity, unspecified: Secondary | ICD-10-CM | POA: Insufficient documentation

## 2016-01-13 DIAGNOSIS — S6000XA Contusion of unspecified finger without damage to nail, initial encounter: Secondary | ICD-10-CM | POA: Diagnosis not present

## 2016-01-13 DIAGNOSIS — E785 Hyperlipidemia, unspecified: Secondary | ICD-10-CM | POA: Insufficient documentation

## 2016-01-13 DIAGNOSIS — I2510 Atherosclerotic heart disease of native coronary artery without angina pectoris: Secondary | ICD-10-CM | POA: Diagnosis not present

## 2016-01-13 DIAGNOSIS — Z794 Long term (current) use of insulin: Secondary | ICD-10-CM | POA: Insufficient documentation

## 2016-01-13 DIAGNOSIS — Y929 Unspecified place or not applicable: Secondary | ICD-10-CM | POA: Diagnosis not present

## 2016-01-13 DIAGNOSIS — S60222A Contusion of left hand, initial encounter: Secondary | ICD-10-CM | POA: Diagnosis not present

## 2016-01-13 DIAGNOSIS — E1140 Type 2 diabetes mellitus with diabetic neuropathy, unspecified: Secondary | ICD-10-CM | POA: Diagnosis not present

## 2016-01-13 DIAGNOSIS — I252 Old myocardial infarction: Secondary | ICD-10-CM | POA: Diagnosis not present

## 2016-01-13 DIAGNOSIS — T148XXA Other injury of unspecified body region, initial encounter: Secondary | ICD-10-CM

## 2016-01-13 DIAGNOSIS — R5383 Other fatigue: Secondary | ICD-10-CM | POA: Diagnosis not present

## 2016-01-13 DIAGNOSIS — Y939 Activity, unspecified: Secondary | ICD-10-CM | POA: Diagnosis not present

## 2016-01-13 DIAGNOSIS — E039 Hypothyroidism, unspecified: Secondary | ICD-10-CM | POA: Insufficient documentation

## 2016-01-13 DIAGNOSIS — S40022A Contusion of left upper arm, initial encounter: Secondary | ICD-10-CM | POA: Diagnosis not present

## 2016-01-13 DIAGNOSIS — X58XXXA Exposure to other specified factors, initial encounter: Secondary | ICD-10-CM | POA: Diagnosis not present

## 2016-01-13 DIAGNOSIS — Y999 Unspecified external cause status: Secondary | ICD-10-CM | POA: Diagnosis not present

## 2016-01-13 DIAGNOSIS — I517 Cardiomegaly: Secondary | ICD-10-CM | POA: Diagnosis not present

## 2016-01-13 DIAGNOSIS — R200 Anesthesia of skin: Secondary | ICD-10-CM | POA: Diagnosis present

## 2016-01-13 DIAGNOSIS — R51 Headache: Secondary | ICD-10-CM | POA: Diagnosis not present

## 2016-01-13 LAB — COMPREHENSIVE METABOLIC PANEL
ALT: 23 U/L (ref 14–54)
AST: 24 U/L (ref 15–41)
Albumin: 3.2 g/dL — ABNORMAL LOW (ref 3.5–5.0)
Alkaline Phosphatase: 189 U/L — ABNORMAL HIGH (ref 38–126)
Anion gap: 8 (ref 5–15)
BUN: 12 mg/dL (ref 6–20)
CO2: 23 mmol/L (ref 22–32)
Calcium: 8.7 mg/dL — ABNORMAL LOW (ref 8.9–10.3)
Chloride: 101 mmol/L (ref 101–111)
Creatinine, Ser: 0.79 mg/dL (ref 0.44–1.00)
GFR calc Af Amer: >60 mL/min (ref >60)
GFR calc non Af Amer: >60 mL/min (ref >60)
Glucose, Bld: 215 mg/dL — ABNORMAL HIGH (ref 65–99)
Potassium: 4.3 mmol/L (ref 3.5–5.1)
Sodium: 132 mmol/L — ABNORMAL LOW (ref 135–145)
Total Bilirubin: 1.5 mg/dL — ABNORMAL HIGH (ref 0.3–1.2)
Total Protein: 7.2 g/dL (ref 6.5–8.1)

## 2016-01-13 LAB — PROTIME-INR
INR: 1.18 (ref 0.00–1.49)
Prothrombin Time: 15.2 seconds (ref 11.6–15.2)

## 2016-01-13 LAB — URINALYSIS, ROUTINE W REFLEX MICROSCOPIC
Bilirubin Urine: NEGATIVE
Glucose, UA: 250 mg/dL — AB
Hgb urine dipstick: NEGATIVE
Ketones, ur: NEGATIVE mg/dL
Leukocytes, UA: NEGATIVE
Nitrite: NEGATIVE
Protein, ur: NEGATIVE mg/dL
Specific Gravity, Urine: 1.009 (ref 1.005–1.030)
pH: 6.5 (ref 5.0–8.0)

## 2016-01-13 LAB — CBC WITH DIFFERENTIAL/PLATELET
Basophils Absolute: 0 10*3/uL (ref 0.0–0.1)
Basophils Relative: 1 %
Eosinophils Absolute: 0.1 10*3/uL (ref 0.0–0.7)
Eosinophils Relative: 2 %
HCT: 40.4 % (ref 36.0–46.0)
Hemoglobin: 13.3 g/dL (ref 12.0–15.0)
Lymphocytes Relative: 17 %
Lymphs Abs: 1 10*3/uL (ref 0.7–4.0)
MCH: 27.1 pg (ref 26.0–34.0)
MCHC: 32.9 g/dL (ref 30.0–36.0)
MCV: 82.3 fL (ref 78.0–100.0)
Monocytes Absolute: 0.9 10*3/uL (ref 0.1–1.0)
Monocytes Relative: 15 %
Neutro Abs: 3.7 10*3/uL (ref 1.7–7.7)
Neutrophils Relative %: 65 %
Platelets: 103 10*3/uL — ABNORMAL LOW (ref 150–400)
RBC: 4.91 MIL/uL (ref 3.87–5.11)
RDW: 15.9 % — ABNORMAL HIGH (ref 11.5–15.5)
WBC: 5.7 10*3/uL (ref 4.0–10.5)

## 2016-01-13 LAB — AMMONIA: Ammonia: 32 umol/L (ref 9–35)

## 2016-01-13 MED ORDER — SODIUM CHLORIDE 0.9 % IV SOLN: INTRAVENOUS | Status: DC

## 2016-01-13 MED ORDER — SODIUM CHLORIDE 0.9 % IV BOLUS (SEPSIS)
1000.0000 mL | Freq: Once | INTRAVENOUS | Status: AC
  Administered 2016-01-13: 1000 mL via INTRAVENOUS

## 2016-01-13 NOTE — ED Notes (Signed)
Pt c/o left hand numbness, pain, darkened skin, fatigue, altered mental status, new onset irregular pulse, orthostatic vital signs noted by home healthcare worker. Pt fell last Friday, lost consciousness, pt had back and arm scanned, went to surgery for multiple wrist fractures on left wrist, never had head CT. No blood thinners.

## 2016-01-13 NOTE — ED Provider Notes (Addendum)
CSN: [id number]     Arrival date & time 01/13/16  1804 History   First MD Initiated Contact with Patient 01/13/16 1916     Chief Complaint  Patient presents with  . Numbness    HPI Pt had surgery on Tuesday for a wrist fracture.  She has been very sleepy since the incident.  She has also seemed a bit confused.  She has not been eating much as well since the surgery.  Family has decreased her pain meds because they thought that could be a factor.   No fevers.  No vomiting.    She has had some tingling in her arm where she had the surgery.  It also looks darker.  They called the orthopedic doctor who suggested loosening of the dressing.  Family has noticed that it seems to be getting a bit better Past Medical History  Diagnosis Date  . Coronary artery disease     status post inferior wall myocardial infarction  . Diabetes mellitus   . Obesity   . Leg edema   . Sleep apnea   . Hypothyroidism   . Dyslipidemia   . Hypotension 08/02/2010    recent episodes of orthostatic hypotension  . Neuropathy of hand     left hand numbness/neuropathy  . Cirrhosis, non-alcoholic ([place])    Past Surgical History  Procedure Laterality Date  . Cardiac catheterization      The ejection fraction is around 50%  . Coronary stent placement    . Cholecystectomy    . Abdominal hysterectomy    . Tubal ligation    . Esophagogastroduodenoscopy N/A 12/12/2012    Procedure: ESOPHAGOGASTRODUODENOSCOPY (EGD);  Surgeon: [name], MD;  Location: [name];  Service: Endoscopy;  Laterality: N/A;  . Flexible sigmoidoscopy N/A 12/12/2012    Procedure: FLEXIBLE SIGMOIDOSCOPY;  Surgeon: [name], MD;  Location: [facility];  Service: Endoscopy;  Laterality: N/A;   Family History  Problem Relation Age of Onset  . Emphysema Brother     non smoker  . Heart disease Father   . Diabetes Mother    Social History  Substance Use Topics  . Smoking status: Never Smoker   . Smokeless tobacco: Never Used   . Alcohol Use: No   OB History    No data available     Review of Systems    Allergies  Ace inhibitors; Penicillins; and Sulfonamide derivatives  Home Medications   Prior to Admission medications   Medication Sig Start Date End Date Taking? Authorizing Provider  ADVAIR DISKUS 250-50 MCG/DOSE AEPB Inhale 2 puffs into the lungs 2 (two) times daily. Shortness of breath/wheezing 12/29/15  Yes Historical Provider, MD  albuterol (PROVENTIL HFA;VENTOLIN HFA) 108 (90 Base) MCG/ACT inhaler Inhale 2 puffs into the lungs every 6 (six) hours as needed for wheezing or shortness of breath.   Yes Historical Provider, MD  atorvastatin (LIPITOR) 20 MG tablet Take 20 mg by mouth daily.    Yes Historical Provider, MD  carvedilol (COREG) 12.5 MG tablet Take 1 tablet (12.5 mg total) by mouth 2 (two) times daily. 10/04/14  Yes Burtis Junes, NP  clindamycin (CLEOCIN) 300 MG capsule Take 300 mg by mouth 3 (three) times daily. 01/09/16  Yes Historical Provider, MD  cycloSPORINE (RESTASIS) 0.05 % ophthalmic emulsion Place 1 drop into both eyes 2 (two) times daily.   Yes Historical Provider, MD  escitalopram (LEXAPRO) 10 MG tablet Take 10 mg by mouth daily.    Yes Historical Provider, MD  furosemide (LASIX) 40 MG tablet Take 1/2 tablet daily Patient taking differently: Take 20 mg by mouth daily. Take 1/2 tablet daily 01/13/14  Yes Thayer Headings, MD  HYDROcodone-acetaminophen (NORCO/VICODIN) 5-325 MG tablet Take 2 tablets by mouth every 4 (four) hours as needed. Patient taking differently: Take 2 tablets by mouth every 4 (four) hours as needed for moderate pain or severe pain.  01/06/16  Yes Tanna Furry, MD  insulin aspart (NOVOLOG) 100 UNIT/ML injection Inject 20 Units into the skin 3 (three) times daily before meals.    Yes Historical Provider, MD  insulin glargine (LANTUS) 100 UNIT/ML injection Inject 64 Units into the skin at bedtime.    Yes Historical Provider, MD  levothyroxine (SYNTHROID, LEVOTHROID) 112  MCG tablet Take 112 mcg by mouth daily before breakfast.   Yes Historical Provider, MD  losartan (COZAAR) 25 MG tablet TAKE 1 TABLET BY MOUTH EVERY DAY 09/14/15  Yes Thayer Headings, MD  methocarbamol (ROBAXIN) 500 MG tablet Take 1 tablet (500 mg total) by mouth 2 (two) times daily. 01/06/16  Yes Tanna Furry, MD  montelukast (SINGULAIR) 10 MG tablet Take 10 mg by mouth daily.   Yes Historical Provider, MD  nitroGLYCERIN (NITROSTAT) 0.4 MG SL tablet Place 1 tablet (0.4 mg total) under the tongue every 5 (five) minutes as needed. Chest pain Patient taking differently: Place 0.4 mg under the tongue every 5 (five) minutes as needed for chest pain. Chest pain 07/07/15  Yes Thayer Headings, MD  pantoprazole (PROTONIX) 40 MG tablet Take 1 tablet (40 mg total) by mouth 2 (two) times daily before a meal. 12/16/12  Yes Eugenie Filler, MD  potassium chloride (KLOR-CON) 10 MEQ CR tablet Take 10 mEq by mouth 2 (two) times daily.    Yes Historical Provider, MD  tiotropium (SPIRIVA) 18 MCG inhalation capsule Place 18 mcg into inhaler and inhale daily.   Yes Historical Provider, MD  White Petrolatum-Mineral Oil (SYSTANE NIGHTTIME) OINT Place 1 application into both eyes at bedtime as needed (irritation).    Yes Historical Provider, MD   BP 161/63 mmHg  Pulse 37  Temp(Src) 97.9 F (36.6 C) (Oral)  Resp 13  SpO2 97% Physical Exam  Constitutional: She is oriented to person, place, and time. She appears well-developed and well-nourished. No distress.  HENT:  Head: Normocephalic and atraumatic.  Right Ear: External ear normal.  Left Ear: External ear normal.  Eyes: Conjunctivae are normal. Right eye exhibits no discharge. Left eye exhibits no discharge. No scleral icterus.  Neck: Neck supple. No tracheal deviation present.  Cardiovascular: Normal rate, regular rhythm and intact distal pulses.   Pulmonary/Chest: Effort normal and breath sounds normal. No stridor. No respiratory distress. She has no wheezes. She  has no rales.  Abdominal: Soft. Bowel sounds are normal. She exhibits no distension. There is no tenderness. There is no rebound and no guarding.  Musculoskeletal: She exhibits edema. She exhibits no tenderness.  Bruising noted around the surgical site to the left arm, there is some bruising and edema noted in the fingertips however the fingernails are pink and the cap refill is less than 2 seconds  Neurological: She is alert and oriented to person, place, and time. She has normal strength. No cranial nerve deficit (no facial droop, extraocular movements intact, no slurred speech) or sensory deficit. She exhibits normal muscle tone. She displays no seizure activity. Coordination normal.  Equal strength in all 4 extremities, patient is able to lift both legs off the bed,  Skin: Skin is warm and dry. No rash noted.  Psychiatric: She has a normal mood and affect.  Nursing note and vitals reviewed.   ED Course  Procedures (including critical care time) Labs Review Labs Reviewed  COMPREHENSIVE METABOLIC PANEL - Abnormal; Notable for the following:    Sodium 132 (*)    Glucose, Bld 215 (*)    Calcium 8.7 (*)    Albumin 3.2 (*)    Alkaline Phosphatase 189 (*)    Total Bilirubin 1.5 (*)    All other components within normal limits  CBC WITH DIFFERENTIAL/PLATELET - Abnormal; Notable for the following:    RDW 15.9 (*)    Platelets 103 (*)    All other components within normal limits  URINALYSIS, ROUTINE W REFLEX MICROSCOPIC (NOT AT [facility]) - Abnormal; Notable for the following:    Glucose, UA 250 (*)    All other components within normal limits  PROTIME-INR  AMMONIA    Imaging Review Dg Chest 2 View  01/13/2016  CLINICAL DATA:  Acute onset of generalized weakness and fatigue. Irregular heartbeat. Initial encounter. EXAM: CHEST  2 VIEW COMPARISON:  Chest radiograph performed 11/02/2014 FINDINGS: The lungs are well-aerated. Mild vascular congestion is noted. There is no evidence of focal  opacification, pleural effusion or pneumothorax. The heart is mildly enlarged. No acute osseous abnormalities are seen. IMPRESSION: Mild vascular congestion and mild cardiomegaly noted. Lungs remain grossly clear. Electronically Signed   By: Garald Balding M.D.   On: 01/13/2016 21:13   Ct Head Wo Contrast  01/13/2016  CLINICAL DATA:  Left hand numbness, pain, fatigue, altered mental status EXAM: CT HEAD WITHOUT CONTRAST TECHNIQUE: Contiguous axial images were obtained from the base of the skull through the vertex without intravenous contrast. COMPARISON:  01/30/2012 FINDINGS: There is no evidence of mass effect, midline shift, or extra-axial fluid collections. There is no evidence of a space-occupying lesion or intracranial hemorrhage. There is no evidence of a cortical-based area of acute infarction. There is generalized cerebral atrophy. There is periventricular white matter low attenuation likely secondary to microangiopathy. The ventricles and sulci are appropriate for the patient's age. The basal cisterns are patent. Visualized portions of the orbits are unremarkable. The visualized portions of the paranasal sinuses and mastoid air cells are unremarkable. Cerebrovascular atherosclerotic calcifications are noted. The osseous structures are unremarkable. IMPRESSION: 1. No acute intracranial pathology. 2. Chronic microvascular disease and cerebral atrophy. Electronically Signed   By: Kathreen Devoid   On: 01/13/2016 21:11   I have personally reviewed and evaluated these images and lab results as part of my medical decision-making.   EKG Interpretation   Date/Time:  Friday Jan 13 2016 19:42:06 EDT Ventricular Rate:  89 PR Interval:  165 QRS Duration: 106 QT Interval:  411 QTC Calculation: 500 R Axis:   93 Text Interpretation:  Sinus rhythm Multiple premature complexes, vent &  supraven Inferolateral infarct, old No significant change since last  tracing Confirmed by Kynlee Koenigsberg  MD-J, Yaeli Hartung ([id number]) on  01/13/2016 7:45:06 PM      MDM   Final diagnoses:  Bruising  Other fatigue    The patient's laboratory tests and CT scans are reassuring. She does not appear to be dehydrated. No elevation in her ammonia level. Her bilirubin is a little bit higher than previous values. I do not think that is clinically significant.    Possible her fatigue may be related to her recovery from her recent surgery.  I suspect her pain medications may be a  factor as well considering her history of hepatic disease.  The patient's discoloration of her arm seems to be related to bruising from her recent surgery. There is no peripheral cyanosis.  Patient's symptoms have improved as she has been here. Family is comfortable taking her home at this point. Patient feels well like to go home as well.    Dorie Rank, MD 01/13/16 2205  Note: documented pulses in the 30s are incorrect.  This was based on the pulse ox which was not reading properly with the PVCs.  The telemetry heart rate and manual heart rate is in the 60s  Dorie Rank, MD 01/13/16 2209

## 2016-01-13 NOTE — ED Notes (Signed)
Pt ambulated well in the hall and to the bathroom with assistance---- gait and balance steady.  Pt denies dizziness.

## 2016-01-13 NOTE — Discharge Instructions (Signed)
Fatigue  Fatigue is feeling tired all of the time, a lack of energy, or a lack of motivation. Occasional or mild fatigue is often a normal response to activity or life in general. However, long-lasting (chronic) or extreme fatigue may indicate an underlying medical condition.  HOME CARE INSTRUCTIONS   Watch your fatigue for any changes. The following actions may help to lessen any discomfort you are feeling:  · Talk to your health care provider about how much sleep you need each night. Try to get the required amount every night.  · Take medicines only as directed by your health care provider.  · Eat a healthy and nutritious diet. Ask your health care provider if you need help changing your diet.  · Drink enough fluid to keep your urine clear or pale yellow.  · Practice ways of relaxing, such as yoga, meditation, massage therapy, or acupuncture.  · Exercise regularly.    · Change situations that cause you stress. Try to keep your work and personal routine reasonable.  · Do not abuse illegal drugs.  · Limit alcohol intake to no more than 1 drink per day for nonpregnant women and 2 drinks per day for men. One drink equals 12 ounces of beer, 5 ounces of wine, or 1½ ounces of hard liquor.  · Take a multivitamin, if directed by your health care provider.  SEEK MEDICAL CARE IF:   · Your fatigue does not get better.  · You have a fever.    · You have unintentional weight loss or gain.  · You have headaches.    · You have difficulty:      Falling asleep.    Sleeping throughout the night.  · You feel angry, guilty, anxious, or sad.     · You are unable to have a bowel movement (constipation).    · You skin is dry.     · Your legs or another part of your body is swollen.    SEEK IMMEDIATE MEDICAL CARE IF:   · You feel confused.    · Your vision is blurry.  · You feel faint or pass out.    · You have a severe headache.    · You have severe abdominal, pelvic, or back pain.    · You have chest pain, shortness of breath, or an  irregular or fast heartbeat.    · You are unable to urinate or you urinate less than normal.    · You develop abnormal bleeding, such as bleeding from the rectum, vagina, nose, lungs, or nipples.  · You vomit blood.     · You have thoughts about harming yourself or committing suicide.    · You are worried that you might harm someone else.       This information is not intended to replace advice given to you by your health care provider. Make sure you discuss any questions you have with your health care provider.     Document Released: 06/03/2007 Document Revised: 08/27/2014 Document Reviewed: 12/08/2013  Elsevier Interactive Patient Education ©2016 [employer]

## 2016-01-13 NOTE — ED Notes (Signed)
Upon assessment, left hand seems bruised but pt able to feel touch to eat fingers.

## 2016-03-13 ENCOUNTER — Encounter: Payer: Self-pay | Admitting: Family Medicine

## 2016-03-13 ENCOUNTER — Ambulatory Visit (INDEPENDENT_AMBULATORY_CARE_PROVIDER_SITE_OTHER): Payer: PPO | Admitting: Family Medicine

## 2016-03-13 VITALS — BP 116/50 | HR 58 | Ht 60.0 in | Wt 182.8 lb

## 2016-03-13 DIAGNOSIS — Z794 Long term (current) use of insulin: Principal | ICD-10-CM

## 2016-03-13 DIAGNOSIS — I5032 Chronic diastolic (congestive) heart failure: Secondary | ICD-10-CM

## 2016-03-13 DIAGNOSIS — E1165 Type 2 diabetes mellitus with hyperglycemia: Secondary | ICD-10-CM | POA: Insufficient documentation

## 2016-03-13 DIAGNOSIS — F329 Major depressive disorder, single episode, unspecified: Secondary | ICD-10-CM | POA: Diagnosis not present

## 2016-03-13 DIAGNOSIS — E118 Type 2 diabetes mellitus with unspecified complications: Secondary | ICD-10-CM | POA: Diagnosis not present

## 2016-03-13 DIAGNOSIS — IMO0001 Reserved for inherently not codable concepts without codable children: Secondary | ICD-10-CM

## 2016-03-13 DIAGNOSIS — E876 Hypokalemia: Secondary | ICD-10-CM

## 2016-03-13 DIAGNOSIS — K5909 Other constipation: Secondary | ICD-10-CM | POA: Insufficient documentation

## 2016-03-13 DIAGNOSIS — I2510 Atherosclerotic heart disease of native coronary artery without angina pectoris: Secondary | ICD-10-CM

## 2016-03-13 DIAGNOSIS — Z9189 Other specified personal risk factors, not elsewhere classified: Secondary | ICD-10-CM

## 2016-03-13 DIAGNOSIS — F32A Depression, unspecified: Secondary | ICD-10-CM | POA: Insufficient documentation

## 2016-03-13 DIAGNOSIS — E782 Mixed hyperlipidemia: Secondary | ICD-10-CM

## 2016-03-13 DIAGNOSIS — E039 Hypothyroidism, unspecified: Secondary | ICD-10-CM | POA: Insufficient documentation

## 2016-03-13 DIAGNOSIS — E038 Other specified hypothyroidism: Secondary | ICD-10-CM | POA: Diagnosis not present

## 2016-03-13 DIAGNOSIS — E1069 Type 1 diabetes mellitus with other specified complication: Secondary | ICD-10-CM | POA: Insufficient documentation

## 2016-03-13 DIAGNOSIS — I2583 Coronary atherosclerosis due to lipid rich plaque: Secondary | ICD-10-CM

## 2016-03-13 LAB — GLUCOSE, POCT (MANUAL RESULT ENTRY): POC Glucose: 338 mg/dl — AB (ref 70–99)

## 2016-03-13 LAB — POCT GLYCOSYLATED HEMOGLOBIN (HGB A1C): Hemoglobin A1C: 10.5

## 2016-03-13 MED ORDER — ESCITALOPRAM OXALATE 20 MG PO TABS: ORAL_TABLET | ORAL | 1 refills | Status: DC

## 2016-03-13 MED ORDER — CARVEDILOL 6.25 MG PO TABS: 6.2500 mg | ORAL_TABLET | Freq: Two times a day (BID) | ORAL | 3 refills | Status: DC

## 2016-03-13 MED ORDER — INSULIN ASPART 100 UNIT/ML ~~LOC~~ SOLN: SUBCUTANEOUS | 1 refills | Status: DC

## 2016-03-13 MED ORDER — INSULIN GLARGINE 100 UNIT/ML SOLOSTAR PEN: PEN_INJECTOR | SUBCUTANEOUS | 1 refills | Status: DC

## 2016-03-13 NOTE — Assessment & Plan Note (Signed)
Heart attack in past with stent placement. Very stable for many years. Sees Dr.Nahser   

## 2016-03-13 NOTE — Patient Instructions (Signed)
Look into joining [facility]  Make sure you are checking your blood sugars with each meal and dosing yourself appropriately for the amount of carbohydrates and sweets you are eating.    Diabetes and Exercise Exercising regularly is important. It is not just about losing weight. It has many health benefits, such as:  Improving your overall fitness, flexibility, and endurance.  Increasing your bone density.  Helping with weight control.  Decreasing your body fat.  Increasing your muscle strength.  Reducing stress and tension.  Improving your overall health. People with diabetes who exercise gain additional benefits because exercise:  Reduces appetite.  Improves the body's use of blood sugar (glucose).  Helps lower or control blood glucose.  Decreases blood pressure.  Helps control blood lipids (such as cholesterol and triglycerides).  Improves the body's use of the hormone insulin by:  Increasing the body's insulin sensitivity.  Reducing the body's insulin needs.  Decreases the risk for heart disease because exercising:  Lowers cholesterol and triglycerides levels.  Increases the levels of good cholesterol (such as high-density lipoproteins [HDL]) in the body.  Lowers blood glucose levels. YOUR ACTIVITY PLAN  Choose an activity that you enjoy, and set realistic goals. To exercise safely, you should begin practicing any new physical activity slowly, and gradually increase the intensity of the exercise over time. Your health care provider or diabetes educator can help create an activity plan that works for you. General recommendations include:  Encouraging children to engage in at least 60 minutes of physical activity each day.  Stretching and performing strength training exercises, such as yoga or weight lifting, at least 2 times per week.  Performing a total of at least 150 minutes of moderate-intensity exercise each week, such as brisk walking or water  aerobics.  Exercising at least 3 days per week, making sure you allow no more than 2 consecutive days to pass without exercising.  Avoiding long periods of inactivity (90 minutes or more). When you have to spend an extended period of time sitting down, take frequent breaks to walk or stretch. RECOMMENDATIONS FOR EXERCISING WITH TYPE 1 OR TYPE 2 DIABETES   Check your blood glucose before exercising. If blood glucose levels are greater than 240 mg/dL, check for urine ketones. Do not exercise if ketones are present.  Avoid injecting insulin into areas of the body that are going to be exercised. For example, avoid injecting insulin into:  The arms when playing tennis.  The legs when jogging.  Keep a record of:  Food intake before and after you exercise.  Expected peak times of insulin action.  Blood glucose levels before and after you exercise.  The type and amount of exercise you have done.  Review your records with your health care provider. Your health care provider will help you to develop guidelines for adjusting food intake and insulin amounts before and after exercising.  If you take insulin or oral hypoglycemic agents, watch for signs and symptoms of hypoglycemia. They include:  Dizziness.  Shaking.  Sweating.  Chills.  Confusion.  Drink plenty of water while you exercise to prevent dehydration or heat stroke. Body water is lost during exercise and must be replaced.  Talk to your health care provider before starting an exercise program to make sure it is safe for you. Remember, almost any type of activity is better than none.   This information is not intended to replace advice given to you by your health care provider. Make sure you discuss any questions  you have with your health care provider.   Document Released: 10/27/2003 Document Revised: 12/21/2014 Document Reviewed: 01/13/2013 Elsevier Interactive Patient Education 2016 [employer].    How and Where to  Give Subcutaneous Insulin Injections, Adult People with type 1 diabetes must take insulin since their bodies do not make it. People with type 2 diabetes may require insulin. There are many different types of insulin as well as other injectable diabetes medicines that are meant to be injected into the fat layer under your skin. The type of insulin or injectable diabetes medicine you take may determine how many injections you give yourself and when to take the injections.  CHOOSING A SITE FOR INJECTION Insulin absorption varies from site to site. As with any injectable medication it is best for the insulin to be injected within the same body region. However, do not inject the insulin in the same spot each time. Rotating the spots you give your injections will prevent inflammation or tissue breakdown. There are four main regions that can be used for injections. The regions include the:  Abdomen (preferred region, especially for non-insulin injectable diabetes medicine).  Front and upper outer sides of thighs.  Back of upper arm.  Buttocks. USING A SYRINGE AND VIAL Drawing up insulin: single insulin dose 1. Wash your hands with soap and water. 2. Gently roll the insulin bottle (vial) between your hands to mix it. Do not shake the vial. 3. Clean the top rubber part of the vial with an alcohol wipe. Be sure that the plastic pop-top has been removed on newer vials. 4. Remove the plastic cover from the needle on the syringe. Do not let the needle touch anything. 5. Pull the plunger back to draw air into the syringe. The air should be the same amount as the insulin dose. 6. Push the needle through the rubber on the top of the vial. Do not turn the vial over. 7. Push the plunger in all the way to put the air into the vial. 8. Leave the needle in the vial and turn the vial and syringe upside down. 9. Pull down slowly on the plunger, drawing the amount of insulin you need into the syringe. 10. Look for  air bubbles in the syringe. You may need to push the plunger up and down 2 to 3 times to slowly get rid of any air bubbles in the syringe. 11. Pull back the plunger to get your correct dose. 12. Remove the needle from the vial. 13. Use an alcohol wipe to clean the area of the body to be injected. 14. Pinch up 1 inch of skin and hold it. 15. Put the needle straight into the skin (90-degree angle). Put the needle in as far as it will go (to the hub). The needle may need to be injected at a 45-degree angle in small adults with little fat. 16. When the needle is in, you can let go of your skin. 17. Push the plunger down all the way to inject the insulin. 18. Pull the needle straight out of the skin. 19. Press the alcohol wipe over the spot where you gave your injection. Keep it there for a few seconds. Do not rub the area. 20. Do not put the plastic cover back on the needle. Drawing up insulin: mixing 2 insulins 1. Wash your hands with soap and water. 2. Gently roll the vial of "cloudy" insulin between your hands or rotate the vial from top to bottom to mix. 3. Clean  the top of both vials with an alcohol wipe. Be sure that the plastic pop-top lid has been removed on newer vials. 4. Pull air into the syringe to equal the dose of "cloudy" insulin. 5. Stick the needle into the "cloudy" insulin vial and inject the air. Be sure to keep the vial upright. 6. Remove the needle from the "cloudy" insulin vial. 7. Pull air into the syringe to equal the dose of "clear" insulin. 8. Stick the needle into the "clear" insulin vial and inject the air. 9. Leave the needle in the "clear" insulin vial and turn the vial upside down. 10. Pull down on the plunger and slowly draw into the syringe the number of units of "clear" insulin desired. 11. Look for air bubbles in the syringe. You may need to push the plunger up and down 2 to 3 times to slowly get rid of any air bubbles in the syringe. 12. Remove the needle from  the "clear" insulin vial. 13. Stick the needle into the "cloudy" insulin vial. Do not inject any of the "clear" insulin into the "cloudy" vial. 14. Turn the "cloudy" vial upside down and pull the plunger down to the number of units that equals the total number of units of "clear" and "cloudy" insulins. 15. Remove the needle from the "cloudy" insulin vial. 16. Use an alcohol wipe to clean the area of the body to be injected. 17. Put the needle straight into the skin (90-degree angle). Put the needle in as far as it will go (to the hub). The needle may need to be injected at a 45-degree angle in small adults with little fat. 18. When the needle is in, you can let go of your skin. 19. Push the plunger down all the way to inject the insulin. 20. Pull the needle straight out of the skin. 21. Press the alcohol wipe over the spot where you gave your injection. Keep it there for a few seconds. Do not rub the area. 22. Do not put the plastic cover back on the needle. USING INSULIN PENS 1. Wash your hands with soap and water. 2. If you are using the "cloudy" insulin, roll the pen between your palms several times or rotate the pen top to bottom several times. 3. Remove the insulin pen cap. 4. Clean the rubber stopper of the cartridge with an alcohol wipe. 5. Remove the protective paper tab from the disposable needle. 6. Screw the needle onto the pen. 7. Remove the outer plastic needle cover. 8. Remove the inner plastic needle cover. 9. Prime the insulin pen by turning the button (dial) to 2 units. Hold the pen with the needle pointing up, and push the dial on the opposite end until a drop of insulin appears at the needle tip. If no insulin appears, repeat this step. 10. Dial the number of units of insulin you will inject. 11. Use an alcohol wipe to clean the area of the body to be injected. 12. Pinch up 1 inch of skin and hold it. 13. Put the needle straight into the skin (90-degree angle). 14. Push  the dial down to push the insulin into the fat tissue. 15. Count to 10 slowly. Then, remove the needle from the fat tissue. 16. Carefully replace the larger outer plastic needle cover over the needle and unscrew the capped needle. THROWING AWAY SUPPLIES  Discard used needles in a puncture proof sharps disposal container. Follow disposal regulations for the area where you live.  Vials and empty  disposable pens may be thrown away in the regular trash.   This information is not intended to replace advice given to you by your health care provider. Make sure you discuss any questions you have with your health care provider.   Document Released: 10/27/2003 Document Revised: 08/27/2014 Document Reviewed: 01/13/2013 Elsevier Interactive Patient Education 2016 Elsevier Inc.   Basic Carbohydrate Counting for Diabetes Mellitus Carbohydrate counting is a method for keeping track of the amount of carbohydrates you eat. Eating carbohydrates naturally increases the level of sugar (glucose) in your blood, so it is important for you to know the amount that is okay for you to have in every meal. Carbohydrate counting helps keep the level of glucose in your blood within normal limits. The amount of carbohydrates allowed is different for every person. A dietitian can help you calculate the amount that is right for you. Once you know the amount of carbohydrates you can have, you can count the carbohydrates in the foods you want to eat. Carbohydrates are found in the following foods:  Grains, such as breads and cereals.  Dried beans and soy products.  Starchy vegetables, such as potatoes, peas, and corn.  Fruit and fruit juices.  Milk and yogurt.  Sweets and snack foods, such as cake, cookies, candy, chips, soft drinks, and fruit drinks. CARBOHYDRATE COUNTING There are two ways to count the carbohydrates in your food. You can use either of the methods or a combination of both. Reading the "Nutrition  Facts" on Packaged Food The "Nutrition Facts" is an area that is included on the labels of almost all packaged food and beverages in the [place]. It includes the serving size of that food or beverage and information about the nutrients in each serving of the food, including the grams (g) of carbohydrate per serving.  Decide the number of servings of this food or beverage that you will be able to eat or drink. Multiply that number of servings by the number of grams of carbohydrate that is listed on the label for that serving. The total will be the amount of carbohydrates you will be having when you eat or drink this food or beverage. Learning Standard Serving Sizes of Food When you eat food that is not packaged or does not include "Nutrition Facts" on the label, you need to measure the servings in order to count the amount of carbohydrates.A serving of most carbohydrate-rich foods contains about 15 g of carbohydrates. The following list includes serving sizes of carbohydrate-rich foods that provide 15 g ofcarbohydrate per serving:   1 slice of bread (1 oz) or 1 six-inch tortilla.    of a hamburger bun or English muffin.  4-6 crackers.   cup unsweetened dry cereal.    cup hot cereal.   cup rice or pasta.    cup mashed potatoes or  of a large baked potato.  1 cup fresh fruit or one small piece of fruit.    cup canned or frozen fruit or fruit juice.  1 cup milk.   cup plain fat-free yogurt or yogurt sweetened with artificial sweeteners.   cup cooked dried beans or starchy vegetable, such as peas, corn, or potatoes.  Decide the number of standard-size servings that you will eat. Multiply that number of servings by 15 (the grams of carbohydrates in that serving). For example, if you eat 2 cups of strawberries, you will have eaten 2 servings and 30 g of carbohydrates (2 servings x 15 g = 30 g).  For foods such as soups and casseroles, in which more than one food is mixed in,  you will need to count the carbohydrates in each food that is included. EXAMPLE OF CARBOHYDRATE COUNTING Sample Dinner  3 oz chicken breast.   cup of brown rice.   cup of corn.  1 cup milk.   1 cup strawberries with sugar-free whipped topping.  Carbohydrate Calculation Step 1: Identify the foods that contain carbohydrates:   Rice.   Corn.   Milk.   Strawberries. Step 2:Calculate the number of servings eaten of each:   2 servings of rice.   1 serving of corn.   1 serving of milk.   1 serving of strawberries. Step 3: Multiply each of those number of servings by 15 g:   2 servings of rice x 15 g = 30 g.   1 serving of corn x 15 g = 15 g.   1 serving of milk x 15 g = 15 g.   1 serving of strawberries x 15 g = 15 g. Step 4: Add together all of the amounts to find the total grams of carbohydrates eaten: 30 g + 15 g + 15 g + 15 g = 75 g.   This information is not intended to replace advice given to you by your health care provider. Make sure you discuss any questions you have with your health care provider.   Document Released: 08/06/2005 Document Revised: 08/27/2014 Document Reviewed: 07/03/2013 Elsevier Interactive Patient Education [employer]

## 2016-03-13 NOTE — Progress Notes (Signed)
New patient office visit note:  Impression and Recommendations:    1. Insulin dependent diabetes mellitus with complications (HCC)   2. Mixed hyperlipidemia   3. Depression   4. Other specified hypothyroidism   5. Coronary artery disease due to lipid rich plaque   6. Hypokalemia   7. Heart failure, diastolic, chronic (HCC)   8. At high risk for osteoporosis    - Med changes as below for her blood pressure, mood disorder and diabetes. -  Long discussion with patient regarding her insulin/ daily coverage of each meal etc. Handouts provided. - Patient will follow up sooner than planned if any problems.  She will let us know if she has any questions or concerns  Patient's Medications  New Prescriptions   CARVEDILOL (COREG) 6.25 MG TABLET    Take 1 tablet (6.25 mg total) by mouth 2 (two) times daily with a meal.   ESCITALOPRAM (LEXAPRO) 20 MG TABLET    one tab by mouth daily   INSULIN GLARGINE (LANTUS SOLOSTAR) 100 UNIT/ML SOLOSTAR PEN    Patient is 64 units at bedtime and 30 units 12 hrs later. 30 d supply Please include QS 3 months.  Previous Medications   ATORVASTATIN (LIPITOR) 20 MG TABLET    Take 20 mg by mouth daily.    CYCLOSPORINE (RESTASIS) 0.05 % OPHTHALMIC EMULSION    Place 1 drop into both eyes 2 (two) times daily.   FUROSEMIDE (LASIX) 40 MG TABLET    Take 1/2 tablet daily   LEVOTHYROXINE (SYNTHROID, LEVOTHROID) 112 MCG TABLET    Take 112 mcg by mouth daily before breakfast.   LOSARTAN (COZAAR) 25 MG TABLET    TAKE 1 TABLET BY MOUTH EVERY DAY   MONTELUKAST (SINGULAIR) 10 MG TABLET    Take 10 mg by mouth daily.   NITROGLYCERIN (NITROSTAT) 0.4 MG SL TABLET    Place 1 tablet (0.4 mg total) under the tongue every 5 (five) minutes as needed. Chest pain   ONE TOUCH ULTRA TEST TEST STRIP    1 strip 3 (three) times daily.   PANTOPRAZOLE (PROTONIX) 40 MG TABLET    Take 1 tablet (40 mg total) by mouth 2 (two) times daily before a meal.   POTASSIUM CHLORIDE (KLOR-CON) 10 MEQ  CR TABLET    Take 10 mEq by mouth 2 (two) times daily.    TRAMADOL (ULTRAM) 50 MG TABLET    Take 1 tablet by mouth every 6 (six) hours as needed.   WHITE PETROLATUM-MINERAL OIL (SYSTANE NIGHTTIME) OINT    Place 1 application into both eyes at bedtime as needed (irritation).   Modified Medications   Modified Medication Previous Medication   INSULIN ASPART (NOVOLOG) 100 UNIT/ML INJECTION insulin aspart (NOVOLOG) 100 UNIT/ML injection      20 units into skin 3 times daily before meals, adjust and increase accordingly. Disp: 6 pens ( 30 d) 1 RF    Inject 20 Units into the skin 3 (three) times daily before meals.   Discontinued Medications   ADVAIR DISKUS 250-50 MCG/DOSE AEPB    Inhale 2 puffs into the lungs 2 (two) times daily. Shortness of breath/wheezing   ALBUTEROL (PROVENTIL HFA;VENTOLIN HFA) 108 (90 BASE) MCG/ACT INHALER    Inhale 2 puffs into the lungs every 6 (six) hours as needed for wheezing or shortness of breath.   CARVEDILOL (COREG) 12.5 MG TABLET    Take 1 tablet (12.5 mg total) by mouth 2 (two) times daily.   CLINDAMYCIN (  CLEOCIN) 300 MG CAPSULE    Take 300 mg by mouth 3 (three) times daily.   ESCITALOPRAM (LEXAPRO) 10 MG TABLET    Take 10 mg by mouth daily.    HYDROCODONE-ACETAMINOPHEN (NORCO/VICODIN) 5-325 MG TABLET    Take 2 tablets by mouth every 4 (four) hours as needed.   INSULIN GLARGINE (LANTUS) 100 UNIT/ML INJECTION    Inject 64 Units into the skin at bedtime.    METHOCARBAMOL (ROBAXIN) 500 MG TABLET    Take 1 tablet (500 mg total) by mouth 2 (two) times daily.   TIOTROPIUM (SPIRIVA) 18 MCG INHALATION CAPSULE    Place 18 mcg into inhaler and inhale daily.    Return in about 3 weeks (around 04/03/2016) for Fasting blood work then OV for Follow-up of current medical issues.  The patient was counseled, risk factors were discussed, anticipatory guidance given.  Gross side effects, risk and benefits, and alternatives of medications discussed with patient.  Patient is aware that  all medications have potential side effects and we are unable to predict every side effect or drug-drug interaction that may occur.  Expresses verbal understanding and consents to current therapy plan and treatment regimen.  Please see AVS handed out to patient at the end of our visit for further patient instructions/ counseling done pertaining to today's office visit.    Note: This document was prepared using Dragon voice recognition software and may include unintentional dictation errors.  ----------------------------------------------------------------------------------------------------------------------    Subjective:    Chief Complaint  Patient presents with  . Establish Care    HPI: [name] is a pleasant 82 y.o. female who presents to [facility] today to review their medical history with me and establish care.   Patient's blood pressure often goes low- For the past couple months it's been doing this. She gets dizzy at times as well and feels like she is going to fall out.  Last time at her cardiologist was 6 months ago or so and per patient, it has never been this low in the past..   They did not make the changes because patient was not having that problem then.   Started 5-6 months ago after card OV.    She she has a home blood pressure monitor and it runs 110's/ 60-70 regularly.  Checks it 3/wkly - son is Engineer, civil (consulting) and he checks it.   He has been telling her to go to the doctors and get it checked.  Depressed lately- 13 deaths in family this year alone. Pt feeling down, not taking care of herself, missing follow-up Madison. Visits etc. pt lives alone.  lexapro- on it forever- 10 yrs or so- same dose, she thinks she needs more. Tolerates it well without side effect.[name]- 5 yrs-  2 sons live really near, daughter in [place].     [name]- with [name].   Card- [name]-   Pulm - [name]  in [place]- hasn't seen 2 yrs   [name]-  Endo- she used to manage her DM.   A1c- 10.5,   Lost to f/up with [name] + Polyuria and polydipsia.  Been eating poorly- lots ice cream, candy bars.  BS been running 200-300 fasting highest 500.   Checks sugars tid.  Takes 20 units novolog TID,   64 untis lantus q hs.  Cost not an issue     Patient Active Problem List   Diagnosis Date Noted  . Insulin dependent diabetes  mellitus with complications (HCC) 03/13/2016    Priority: High  . Depression 03/13/2016  . Hypothyroidism 03/13/2016  . Mixed hyperlipidemia 03/13/2016  . Constipation, chronic 03/13/2016  . At high risk for osteoporosis 03/13/2016  . Hypotension, unspecified 12/13/2012  . Hypokalemia 12/13/2012  . Thrombocytopenia, unspecified (HCC) 12/13/2012  . h/o Clostridium difficile colitis 12/12/2012  . h/o GIB (gastrointestinal bleeding) 12/12/2012  . ILD (interstitial lung disease) (HCC) 11/25/2012  . Coronary artery disease 04/11/2011  . COPD (chronic obstructive pulmonary disease) (HCC) 04/11/2011  . Sleep apnea 04/11/2011  . Heart failure, diastolic, chronic (HCC) 03/29/2008     Past Medical History:  Diagnosis Date  . Cirrhosis, non-alcoholic (HCC)   . Coronary artery disease    status post inferior wall myocardial infarction  . Diabetes mellitus   . Dyslipidemia   . Hypotension 08/02/2010   recent episodes of orthostatic hypotension  . Hypothyroidism   . Leg edema   . Myocardial infarct, old   . Neuropathy of hand    left hand numbness/neuropathy  . Obesity   . Sleep apnea      Past Surgical History:  Procedure Laterality Date  . ABDOMINAL HYSTERECTOMY    . CARDIAC CATHETERIZATION     The ejection fraction is around 50%  . CHOLECYSTECTOMY    . CORONARY STENT PLACEMENT    . ESOPHAGOGASTRODUODENOSCOPY N/A 12/12/2012   Procedure: ESOPHAGOGASTRODUODENOSCOPY (EGD);  Surgeon: Florencia Reasons, MD;  Location: [facility];  Service: Endoscopy;  Laterality: N/A;  . FLEXIBLE SIGMOIDOSCOPY N/A 12/12/2012    Procedure: FLEXIBLE SIGMOIDOSCOPY;  Surgeon: Florencia Reasons, MD;  Location: [facility];  Service: Endoscopy;  Laterality: N/A;  . TUBAL LIGATION    . WRIST SURGERY       Family History  Problem Relation Age of Onset  . Emphysema Brother     non smoker  . Diabetes Brother   . Hyperlipidemia Brother   . Hypertension Brother   . Heart disease Father   . Heart attack Father   . Diabetes Father   . Hyperlipidemia Father   . Hypertension Father   . Diabetes Mother   . Diabetes Sister   . Hyperlipidemia Sister   . Hypertension Sister   . Heart attack Daughter   . Diabetes Daughter   . Diabetes Son   . Suicidality Son   . Alcohol abuse Son   . Alcohol abuse Maternal Uncle   . Heart attack Paternal Grandfather   . Diabetes Daughter   . Cancer Son     melanoma  . Diabetes Son   . Diabetes Son      History  Drug Use No    History  Alcohol Use No    History  Smoking Status  . Former Smoker  . Quit date: 08/20/1973  Smokeless Tobacco  . Never Used    Patient's Medications  New Prescriptions   CARVEDILOL (COREG) 6.25 MG TABLET    Take 1 tablet (6.25 mg total) by mouth 2 (two) times daily with a meal.   ESCITALOPRAM (LEXAPRO) 20 MG TABLET    one tab by mouth daily   INSULIN GLARGINE (LANTUS SOLOSTAR) 100 UNIT/ML SOLOSTAR PEN    Patient is 64 units at bedtime and 30 units 12 hrs later. 30 d supply Please include QS 3 months.  Previous Medications   ATORVASTATIN (LIPITOR) 20 MG TABLET    Take 20 mg by mouth daily.    CYCLOSPORINE (RESTASIS) 0.05 % OPHTHALMIC EMULSION    Place 1 drop into  both eyes 2 (two) times daily.   FUROSEMIDE (LASIX) 40 MG TABLET    Take 1/2 tablet daily   LEVOTHYROXINE (SYNTHROID, LEVOTHROID) 112 MCG TABLET    Take 112 mcg by mouth daily before breakfast.   LOSARTAN (COZAAR) 25 MG TABLET    TAKE 1 TABLET BY MOUTH EVERY DAY   MONTELUKAST (SINGULAIR) 10 MG TABLET    Take 10 mg by mouth daily.   NITROGLYCERIN (NITROSTAT) 0.4 MG SL TABLET    Place  1 tablet (0.4 mg total) under the tongue every 5 (five) minutes as needed. Chest pain   ONE TOUCH ULTRA TEST TEST STRIP    1 strip 3 (three) times daily.   PANTOPRAZOLE (PROTONIX) 40 MG TABLET    Take 1 tablet (40 mg total) by mouth 2 (two) times daily before a meal.   POTASSIUM CHLORIDE (KLOR-CON) 10 MEQ CR TABLET    Take 10 mEq by mouth 2 (two) times daily.    TRAMADOL (ULTRAM) 50 MG TABLET    Take 1 tablet by mouth every 6 (six) hours as needed.   WHITE PETROLATUM-MINERAL OIL (SYSTANE NIGHTTIME) OINT    Place 1 application into both eyes at bedtime as needed (irritation).   Modified Medications   Modified Medication Previous Medication   INSULIN ASPART (NOVOLOG) 100 UNIT/ML INJECTION insulin aspart (NOVOLOG) 100 UNIT/ML injection      20 units into skin 3 times daily before meals, adjust and increase accordingly. Disp: 6 pens ( 30 d) 1 RF    Inject 20 Units into the skin 3 (three) times daily before meals.   Discontinued Medications   ADVAIR DISKUS 250-50 MCG/DOSE AEPB    Inhale 2 puffs into the lungs 2 (two) times daily. Shortness of breath/wheezing   ALBUTEROL (PROVENTIL HFA;VENTOLIN HFA) 108 (90 BASE) MCG/ACT INHALER    Inhale 2 puffs into the lungs every 6 (six) hours as needed for wheezing or shortness of breath.   CARVEDILOL (COREG) 12.5 MG TABLET    Take 1 tablet (12.5 mg total) by mouth 2 (two) times daily.   CLINDAMYCIN (CLEOCIN) 300 MG CAPSULE    Take 300 mg by mouth 3 (three) times daily.   ESCITALOPRAM (LEXAPRO) 10 MG TABLET    Take 10 mg by mouth daily.    HYDROCODONE-ACETAMINOPHEN (NORCO/VICODIN) 5-325 MG TABLET    Take 2 tablets by mouth every 4 (four) hours as needed.   INSULIN GLARGINE (LANTUS) 100 UNIT/ML INJECTION    Inject 64 Units into the skin at bedtime.    METHOCARBAMOL (ROBAXIN) 500 MG TABLET    Take 1 tablet (500 mg total) by mouth 2 (two) times daily.   TIOTROPIUM (SPIRIVA) 18 MCG INHALATION CAPSULE    Place 18 mcg into inhaler and inhale daily.    Allergies: Ace  inhibitors; Benadryl [diphenhydramine hcl (sleep)]; Penicillins; and Sulfonamide derivatives  Review of Systems:   ( Completed via Adult Medical History Intake form today ) General:  Denies fever, chills, appetite changes, unexplained weight loss.  Optho/Auditory:   Denies visual changes, blurred vision/LOV, ringing in ears/ diff hearing Respiratory:   Denies SOB, DOE, cough, wheezing.  Cardiovascular:   Denies chest pain, palpitations, new onset peripheral edema  Gastrointestinal:   Denies nausea, vomiting, diarrhea.  Genitourinary:    Denies dysuria, increased frequency, flank pain.  Endocrine:     Denies hot or cold intolerance, polyuria, polydipsia. Musculoskeletal:  Denies unexplained myalgias, joint swelling, arthralgias, gait problems.  Skin:  Denies rash, suspicious lesions or new/ changes in  moles Neurological:    Denies dizziness, syncope, unexplained weakness, lightheadedness, numbness  Psychiatric/Behavioral:   Denies mood changes, suicidal or homicidal ideations, hallucinations    Objective:   Blood pressure  118/53, pulse  58, height 5' (1.524 m), weight 182 lb 12.8 oz (82.9 kg). General: Well Developed, well nourished, and in no acute distress.  Neuro: Alert and oriented x3, extra-ocular muscles intact, sensation grossly intact.  HEENT: Normocephalic, atraumatic, pupils equal round reactive to light, neck supple, no gross masses, no carotid bruits, no JVD apprec Skin: no gross suspicious lesions or rashes  Cardiac: Regular rate and rhythm, no murmurs rubs or gallops.  Respiratory: Essentially clear to auscultation bilaterally. Not using accessory muscles, speaking in full sentences.  Abdominal: Soft, not grossly distended Musculoskeletal: Ambulates w/o diff, FROM * 4 ext.  Vasc: less 2 sec cap RF, warm and pink, No pitting edema bilaterally. Psych:  No HI/SI, judgement and insight good.

## 2016-03-14 LAB — MICROALBUMIN / CREATININE URINE RATIO
Creatinine, Urine: 20 mg/dL (ref 20–320)
Microalb, Ur: <0.2 mg/dL

## 2016-03-20 ENCOUNTER — Other Ambulatory Visit (INDEPENDENT_AMBULATORY_CARE_PROVIDER_SITE_OTHER): Payer: PPO

## 2016-03-20 DIAGNOSIS — F329 Major depressive disorder, single episode, unspecified: Secondary | ICD-10-CM

## 2016-03-20 DIAGNOSIS — I2510 Atherosclerotic heart disease of native coronary artery without angina pectoris: Secondary | ICD-10-CM

## 2016-03-20 DIAGNOSIS — E038 Other specified hypothyroidism: Secondary | ICD-10-CM

## 2016-03-20 DIAGNOSIS — Z794 Long term (current) use of insulin: Principal | ICD-10-CM

## 2016-03-20 DIAGNOSIS — IMO0001 Reserved for inherently not codable concepts without codable children: Secondary | ICD-10-CM

## 2016-03-20 DIAGNOSIS — E876 Hypokalemia: Secondary | ICD-10-CM

## 2016-03-20 DIAGNOSIS — E118 Type 2 diabetes mellitus with unspecified complications: Secondary | ICD-10-CM | POA: Diagnosis not present

## 2016-03-20 DIAGNOSIS — I5032 Chronic diastolic (congestive) heart failure: Secondary | ICD-10-CM

## 2016-03-20 DIAGNOSIS — F32A Depression, unspecified: Secondary | ICD-10-CM

## 2016-03-20 DIAGNOSIS — Z9189 Other specified personal risk factors, not elsewhere classified: Secondary | ICD-10-CM

## 2016-03-20 DIAGNOSIS — E782 Mixed hyperlipidemia: Secondary | ICD-10-CM

## 2016-03-20 DIAGNOSIS — I2583 Coronary atherosclerosis due to lipid rich plaque: Secondary | ICD-10-CM

## 2016-03-21 LAB — CBC WITH DIFFERENTIAL/PLATELET
Basophils Absolute: 32 cells/uL (ref 0–200)
Basophils Relative: 1 %
Eosinophils Absolute: 96 cells/uL (ref 15–500)
Eosinophils Relative: 3 %
HCT: 42.7 % (ref 35.0–45.0)
Hemoglobin: 13.5 g/dL (ref 11.7–15.5)
Lymphocytes Relative: 27 %
Lymphs Abs: 864 cells/uL (ref 850–3900)
MCH: 26.2 pg — ABNORMAL LOW (ref 27.0–33.0)
MCHC: 31.6 g/dL — ABNORMAL LOW (ref 32.0–36.0)
MCV: 82.9 fL (ref 80.0–100.0)
Monocytes Absolute: 416 cells/uL (ref 200–950)
Monocytes Relative: 13 %
Neutro Abs: 1792 cells/uL (ref 1500–7800)
Neutrophils Relative %: 56 %
Platelets: 61 10*3/uL — ABNORMAL LOW (ref 140–400)
RBC: 5.15 MIL/uL — ABNORMAL HIGH (ref 3.80–5.10)
RDW: 15.4 % — ABNORMAL HIGH (ref 11.0–15.0)
WBC: 3.2 10*3/uL — ABNORMAL LOW (ref 3.8–10.8)

## 2016-03-21 LAB — COMPLETE METABOLIC PANEL WITH GFR
ALT: 16 U/L (ref 6–29)
AST: 23 U/L (ref 10–35)
Albumin: 3.5 g/dL — ABNORMAL LOW (ref 3.6–5.1)
Alkaline Phosphatase: 192 U/L — ABNORMAL HIGH (ref 33–130)
BUN: 9 mg/dL (ref 7–25)
CO2: 27 mmol/L (ref 20–31)
Calcium: 8.7 mg/dL (ref 8.6–10.4)
Chloride: 103 mmol/L (ref 98–110)
Creat: 0.93 mg/dL (ref 0.60–0.93)
GFR, Est African American: 68 mL/min (ref >=60)
GFR, Est Non African American: 59 mL/min — ABNORMAL LOW (ref >=60)
Glucose, Bld: 170 mg/dL — ABNORMAL HIGH (ref 65–99)
Potassium: 5.2 mmol/L (ref 3.5–5.3)
Sodium: 137 mmol/L (ref 135–146)
Total Bilirubin: 0.8 mg/dL (ref 0.2–1.2)
Total Protein: 7 g/dL (ref 6.1–8.1)

## 2016-03-21 LAB — TSH: TSH: 0.71 mIU/L

## 2016-03-21 LAB — VITAMIN D 25 HYDROXY (VIT D DEFICIENCY, FRACTURES): Vit D, 25-Hydroxy: 14 ng/mL — ABNORMAL LOW (ref 30–100)

## 2016-03-22 ENCOUNTER — Ambulatory Visit: Payer: PPO | Admitting: Family Medicine

## 2016-04-18 ENCOUNTER — Other Ambulatory Visit: Payer: Self-pay | Admitting: Family Medicine

## 2016-04-18 NOTE — Telephone Encounter (Signed)
If it is not on her list, we will have to bring her in to discuss it prior to me refilling it.

## 2016-04-18 NOTE — Telephone Encounter (Signed)
I do not see this medication on the pt's medication list.  Pt has follow up appt with you on 05/11/2016.  [name]. [name], CMA

## 2016-04-19 ENCOUNTER — Other Ambulatory Visit: Payer: Self-pay | Admitting: Family Medicine

## 2016-04-20 NOTE — Telephone Encounter (Signed)
The medication is not even on her list and I have not evaluated patient for this condition. She can either call her pulmonologist for refill or f/up with me to discuss this further

## 2016-04-26 ENCOUNTER — Other Ambulatory Visit: Payer: Self-pay | Admitting: Family Medicine

## 2016-04-30 ENCOUNTER — Ambulatory Visit (INDEPENDENT_AMBULATORY_CARE_PROVIDER_SITE_OTHER): Payer: PPO | Admitting: Ophthalmology

## 2016-04-30 DIAGNOSIS — E11311 Type 2 diabetes mellitus with unspecified diabetic retinopathy with macular edema: Secondary | ICD-10-CM

## 2016-04-30 DIAGNOSIS — H43813 Vitreous degeneration, bilateral: Secondary | ICD-10-CM

## 2016-04-30 DIAGNOSIS — H35033 Hypertensive retinopathy, bilateral: Secondary | ICD-10-CM

## 2016-04-30 DIAGNOSIS — E113592 Type 2 diabetes mellitus with proliferative diabetic retinopathy without macular edema, left eye: Secondary | ICD-10-CM

## 2016-04-30 DIAGNOSIS — I10 Essential (primary) hypertension: Secondary | ICD-10-CM | POA: Diagnosis not present

## 2016-04-30 DIAGNOSIS — E113511 Type 2 diabetes mellitus with proliferative diabetic retinopathy with macular edema, right eye: Secondary | ICD-10-CM

## 2016-04-30 LAB — HM DIABETES EYE EXAM

## 2016-05-01 ENCOUNTER — Other Ambulatory Visit: Payer: Self-pay

## 2016-05-01 DIAGNOSIS — E782 Mixed hyperlipidemia: Secondary | ICD-10-CM

## 2016-05-02 ENCOUNTER — Other Ambulatory Visit (INDEPENDENT_AMBULATORY_CARE_PROVIDER_SITE_OTHER): Payer: PPO

## 2016-05-02 DIAGNOSIS — E782 Mixed hyperlipidemia: Secondary | ICD-10-CM | POA: Diagnosis not present

## 2016-05-03 LAB — CBC WITH DIFFERENTIAL/PLATELET
Basophils Absolute: 0 cells/uL (ref 0–200)
Basophils Relative: 0 %
Eosinophils Absolute: 147 cells/uL (ref 15–500)
Eosinophils Relative: 3 %
HCT: 40.3 % (ref 35.0–45.0)
Hemoglobin: 13.2 g/dL (ref 11.7–15.5)
Lymphocytes Relative: 17 %
Lymphs Abs: 833 cells/uL — ABNORMAL LOW (ref 850–3900)
MCH: 26.3 pg — ABNORMAL LOW (ref 27.0–33.0)
MCHC: 32.8 g/dL (ref 32.0–36.0)
MCV: 80.4 fL (ref 80.0–100.0)
Monocytes Absolute: 784 cells/uL (ref 200–950)
Monocytes Relative: 16 %
Neutro Abs: 3136 cells/uL (ref 1500–7800)
Neutrophils Relative %: 64 %
Platelets: 85 10*3/uL — ABNORMAL LOW (ref 140–400)
RBC: 5.01 MIL/uL (ref 3.80–5.10)
RDW: 16.6 % — ABNORMAL HIGH (ref 11.0–15.0)
WBC: 4.9 10*3/uL (ref 3.8–10.8)

## 2016-05-03 LAB — COMPLETE METABOLIC PANEL WITH GFR
ALT: 21 U/L (ref 6–29)
AST: 25 U/L (ref 10–35)
Albumin: 3.5 g/dL — ABNORMAL LOW (ref 3.6–5.1)
Alkaline Phosphatase: 207 U/L — ABNORMAL HIGH (ref 33–130)
BUN: 9 mg/dL (ref 7–25)
CO2: 23 mmol/L (ref 20–31)
Calcium: 8.7 mg/dL (ref 8.6–10.4)
Chloride: 105 mmol/L (ref 98–110)
Creat: 0.82 mg/dL (ref 0.60–0.93)
GFR, Est African American: 79 mL/min (ref >=60)
GFR, Est Non African American: 68 mL/min (ref >=60)
Glucose, Bld: 93 mg/dL (ref 65–99)
Potassium: 4.4 mmol/L (ref 3.5–5.3)
Sodium: 137 mmol/L (ref 135–146)
Total Bilirubin: 0.8 mg/dL (ref 0.2–1.2)
Total Protein: 6.8 g/dL (ref 6.1–8.1)

## 2016-05-03 LAB — LIPID PANEL
Cholesterol: 136 mg/dL (ref 125–200)
HDL: 55 mg/dL (ref >=46)
LDL Cholesterol: 56 mg/dL (ref <130)
Total CHOL/HDL Ratio: 2.5 Ratio (ref <=5.0)
Triglycerides: 125 mg/dL (ref <150)
VLDL: 25 mg/dL (ref <30)

## 2016-05-11 ENCOUNTER — Ambulatory Visit (INDEPENDENT_AMBULATORY_CARE_PROVIDER_SITE_OTHER): Payer: PPO | Admitting: Family Medicine

## 2016-05-11 ENCOUNTER — Encounter: Payer: Self-pay | Admitting: Family Medicine

## 2016-05-11 ENCOUNTER — Telehealth: Payer: Self-pay | Admitting: Family Medicine

## 2016-05-11 VITALS — BP 95/52 | HR 52 | Ht 60.0 in | Wt 181.7 lb

## 2016-05-11 DIAGNOSIS — E039 Hypothyroidism, unspecified: Secondary | ICD-10-CM

## 2016-05-11 DIAGNOSIS — D72819 Decreased white blood cell count, unspecified: Secondary | ICD-10-CM | POA: Diagnosis not present

## 2016-05-11 DIAGNOSIS — E559 Vitamin D deficiency, unspecified: Secondary | ICD-10-CM | POA: Insufficient documentation

## 2016-05-11 DIAGNOSIS — R748 Abnormal levels of other serum enzymes: Secondary | ICD-10-CM

## 2016-05-11 DIAGNOSIS — D696 Thrombocytopenia, unspecified: Secondary | ICD-10-CM | POA: Diagnosis not present

## 2016-05-11 DIAGNOSIS — E118 Type 2 diabetes mellitus with unspecified complications: Secondary | ICD-10-CM

## 2016-05-11 DIAGNOSIS — E782 Mixed hyperlipidemia: Secondary | ICD-10-CM

## 2016-05-11 DIAGNOSIS — F32A Depression, unspecified: Secondary | ICD-10-CM

## 2016-05-11 DIAGNOSIS — F329 Major depressive disorder, single episode, unspecified: Secondary | ICD-10-CM

## 2016-05-11 DIAGNOSIS — IMO0001 Reserved for inherently not codable concepts without codable children: Secondary | ICD-10-CM

## 2016-05-11 DIAGNOSIS — I959 Hypotension, unspecified: Secondary | ICD-10-CM

## 2016-05-11 DIAGNOSIS — Z794 Long term (current) use of insulin: Secondary | ICD-10-CM

## 2016-05-11 DIAGNOSIS — Z8719 Personal history of other diseases of the digestive system: Secondary | ICD-10-CM

## 2016-05-11 MED ORDER — VITAMIN D3 125 MCG (5000 UT) PO TABS: ORAL_TABLET | ORAL | 3 refills | Status: DC

## 2016-05-11 MED ORDER — VITAMIN D (ERGOCALCIFEROL) 1.25 MG (50000 UNIT) PO CAPS: 50000.0000 [IU] | ORAL_CAPSULE | ORAL | 20 refills | Status: DC

## 2016-05-11 MED ORDER — CARVEDILOL 3.125 MG PO TABS: 3.1250 mg | ORAL_TABLET | Freq: Two times a day (BID) | ORAL | 0 refills | Status: DC

## 2016-05-11 NOTE — Assessment & Plan Note (Signed)
Cholesterol levels are the best they've looked in years.  Continue current regimen.  Encouraged low saturated and trans fats

## 2016-05-11 NOTE — Assessment & Plan Note (Signed)
Levels are stable, continue current medications. 

## 2016-05-11 NOTE — Assessment & Plan Note (Signed)
Weekly and daily supplementation recommended. Patient understands this will be lifetime

## 2016-05-11 NOTE — Assessment & Plan Note (Addendum)
To differentiate between bone and liver origin,I will obtain GGT and serum 5 nucleotidase as well as parathyroid hormone.  ( Ca and Vit D already obtained)   However, upon further review of patient's chart from years and yrs ago, her alkaline phosphatase levels were even higher in 2013 ranging from 232, then 255 and also 268.   So, that is reassuring.

## 2016-05-11 NOTE — Assessment & Plan Note (Signed)
Patient's blood sugars have been much better controlled lately in the 110s to 140s range.  She has been drinking much more water than prior.  She is having extremely mild symptoms of orthostatic hypotension, so I will decrease her carvedilol dose a little bit and patient will call for cardiology follow-up in the very near future as she is due to return.  She will call for appt today.  Further mgt per cards.

## 2016-05-11 NOTE — Assessment & Plan Note (Signed)
Continue to closely monitor blood sugars and try to walk 10-20 minutes daily and eat a prudent diet. Recheck around October 25th

## 2016-05-11 NOTE — Progress Notes (Signed)
Assessment and plan:  1. Elevated serum alkaline phosphatase level   2. Chronic leukopenia   3. Thrombocytopenia, unspecified ([place])   4. Vitamin D deficiency   5. Hypothyroidism, unspecified hypothyroidism type   6. Mixed hyperlipidemia   7. Insulin dependent diabetes mellitus with complications (HCC)   8. Depression   9. Hypotension, unspecified   10. History of GI bleed     Insulin-dependent diabetes mellitus  Continue to closely monitor blood sugars and try to walk 10-20 minutes daily and eat a prudent diet. Recheck around October 25th   Chronic leukopenia Patient's platelets have been extremely stable for many, many years.  Back in 2014 they were 69, then 80, then in May 2017, they were 76, then recently 60. They have been fluctuating in this range for as long as patient can recall.  Elevated serum alkaline phosphatase level To differentiate between bone and liver origin,I will obtain GGT and serum 5 nucleotidase as well as parathyroid hormone.  ( Ca and Vit D already obtained)   However, upon further review of patient's chart from years and yrs ago, her alkaline phosphatase levels were even higher in 2013 ranging from 232, then 255 and also 268.   So, that is reassuring.   Vitamin D deficiency Weekly and daily supplementation recommended. Patient understands this will be lifetime  Mixed hyperlipidemia Cholesterol levels are the best they've looked in years.  Continue current regimen.  Encouraged low saturated and trans fats  Hypothyroidism Levels are stable, continue current medications.  Hypotension, unspecified Patient's blood sugars have been much better controlled lately in the 110s to 140s range.  She has been drinking much more water than prior.  She is having extremely mild symptoms of orthostatic hypotension, so I did decrease her carvedilol from 6.25 twice a day to 3.125 twice a day. She will  follow up with cardiology. Only 30 days of meds given.  Further medications will be given to patient per cardiology.  She will call for appt today.  Further mgt per cards.  History of GI bleed Stable- pt w/o symptoms, CBC stable  Depression Mood stable- on lexapro     Patient's Medications  New Prescriptions   CHOLECALCIFEROL (VITAMIN D3) 5000 UNITS TABS    5,000 IU OTC vitamin D3 daily.   VITAMIN D, ERGOCALCIFEROL, (DRISDOL) 50000 UNITS CAPS CAPSULE    Take 1 capsule (50,000 Units total) by mouth every 7 (seven) days.  Previous Medications   ATORVASTATIN (LIPITOR) 20 MG TABLET    Take 20 mg by mouth daily.    CYCLOSPORINE (RESTASIS) 0.05 % OPHTHALMIC EMULSION    Place 1 drop into both eyes 2 (two) times daily.   ESCITALOPRAM (LEXAPRO) 10 MG TABLET    Take 1 tablet by mouth daily.   FUROSEMIDE (LASIX) 40 MG TABLET    Take 1/2 tablet daily   INSULIN ASPART (NOVOLOG) 100 UNIT/ML INJECTION    20 units into skin 3 times daily before meals, adjust and increase accordingly. Disp: 6 pens ( 30 d) 1 RF   INSULIN GLARGINE (LANTUS SOLOSTAR) 100 UNIT/ML SOLOSTAR PEN    Patient is 64 units at bedtime and 30 units 12 hrs later. 30 d supply Please include QS 3 months.   LEVOTHYROXINE (SYNTHROID, LEVOTHROID) 112 MCG TABLET    Take 112 mcg by mouth daily before breakfast.   LOSARTAN (COZAAR) 25 MG TABLET    TAKE 1 TABLET BY MOUTH EVERY DAY   MONTELUKAST (SINGULAIR) 10 MG  TABLET    Take 10 mg by mouth daily.   NITROGLYCERIN (NITROSTAT) 0.4 MG SL TABLET    Place 1 tablet (0.4 mg total) under the tongue every 5 (five) minutes as needed. Chest pain   ONE TOUCH ULTRA TEST TEST STRIP    1 strip 3 (three) times daily.   PANTOPRAZOLE (PROTONIX) 40 MG TABLET    Take 1 tablet (40 mg total) by mouth 2 (two) times daily before a meal.   POTASSIUM CHLORIDE (KLOR-CON) 10 MEQ CR TABLET    Take 10 mEq by mouth 2 (two) times daily.    TRAMADOL (ULTRAM) 50 MG TABLET    Take 1 tablet by mouth every 6 (six) hours as needed.     WHITE PETROLATUM-MINERAL OIL (SYSTANE NIGHTTIME) OINT    Place 1 application into both eyes at bedtime as needed (irritation).   Modified Medications   Modified Medication Previous Medication   CARVEDILOL (COREG) 3.125 MG TABLET carvedilol (COREG) 6.25 MG tablet      Take 1 tablet (3.125 mg total) by mouth 2 (two) times daily with a meal.    Take 1 tablet (6.25 mg total) by mouth 2 (two) times daily with a meal.  Discontinued Medications   ESCITALOPRAM (LEXAPRO) 20 MG TABLET    one tab by mouth daily    Return in about 4 weeks (around 06/08/2016) for reck ALk Phos in 1 mo. Lab only OV., then 2 wks later- come in to see me to discuss and reck BP.   Anticipatory guidance and routine counseling done re: condition, txmnt options and need for follow up. All questions of patient's were answered.   Gross side effects, risk and benefits, and alternatives of medications discussed with patient.  Patient is aware that all medications have potential side effects and we are unable to predict every sideeffect or drug-drug interaction that may occur.  Expresses verbal understanding and consents to current therapy plan and treatment regiment.  Please see AVS handed out to patient at the end of our visit for additional patient instructions/ counseling done pertaining to today's office visit.  Note: This document was prepared using Dragon voice recognition software and may include unintentional dictation errors.   ----------------------------------------------------------------------------------------------------------------------  Subjective:   CC: [name] is a 82 y.o. female who presents to [facility] today for review and discussion of recent bloodwork that was done.   Has had low BP for 2 + yrs now:  Feel maybe just a little shakey when first gets up in the am's occassionally. For past couple yrs- pt has had some low BP's around today's range and no fall/ syncope  etc.   She last saw Cards- Nahser on 07/07/15 and Bp at that time BP 90/50 mmHg and cards doc did not seem to be concerned about it at that time.   -->DM:  a1c was 10.5 in 7/25 and we had a long discussion about her changing her diet and lifestyle and she has.--> pt has been doing MUCH better with eating habits and no exercise yet.  FBS lately have been running 110's-143, and it used to be in the 300's!   Congratulated her   Patient has a long history of low platelets, per pt has been evaluated for this in the past. This is been for many, many years per pt.  Her GI doctor in the past told her to stop her ASA because of low platelet count - she does not take any.  She has h/o having slow GI bleeds- couple yrs ago she was hospitalized because of it.  No dark tarry stools etc lately, no emesis/ hetamemesis   Wt Readings from Last 3 Encounters:  05/11/16 181 lb 11.2 oz (82.4 kg)  03/13/16 182 lb 12.8 oz (82.9 kg)  07/07/15 186 lb (84.4 kg)   BP Readings from Last 3 Encounters:  05/11/16 (!) 95/52  03/13/16 (!) 116/50  01/13/16 155/63   Pulse Readings from Last 3 Encounters:  05/11/16 (!) 52  03/13/16 (!) 58  01/13/16 (S) 62   BMI Readings from Last 3 Encounters:  05/11/16 35.49 kg/m  03/13/16 35.70 kg/m  07/07/15 36.33 kg/m     Full medical history updated and reviewed in the office today  Patient Active Problem List   Diagnosis Date Noted  . Insulin dependent diabetes mellitus with complications ([place]) 73/71/0626    Priority: High  . Chronic leukopenia 05/11/2016  . Vitamin D deficiency 05/11/2016  . Elevated serum alkaline phosphatase level 05/11/2016  . Depression 03/13/2016  . Hypothyroidism 03/13/2016  . Mixed hyperlipidemia 03/13/2016  . Constipation, chronic 03/13/2016  . At high risk for osteoporosis 03/13/2016  . Hypotension, unspecified 12/13/2012  . Hypokalemia 12/13/2012  . Thrombocytopenia, unspecified ([place]) 12/13/2012  . h/o Clostridium difficile colitis  12/12/2012  . History of GI bleed 12/12/2012  . ILD (interstitial lung disease) ([place]) 11/25/2012  . Coronary artery disease 04/11/2011  . COPD (chronic obstructive pulmonary disease) ([place]) 04/11/2011  . Sleep apnea 04/11/2011  . Heart failure, diastolic, chronic ([place]) 94/85/4627    Past Medical History:  Diagnosis Date  . Cirrhosis, non-alcoholic ([place])   . Coronary artery disease    status post inferior wall myocardial infarction  . Diabetes mellitus   . Dyslipidemia   . Hypotension 08/02/2010   recent episodes of orthostatic hypotension  . Hypothyroidism   . Leg edema   . Myocardial infarct, old   . Neuropathy of hand    left hand numbness/neuropathy  . Obesity   . Sleep apnea     Past Surgical History:  Procedure Laterality Date  . ABDOMINAL HYSTERECTOMY    . CARDIAC CATHETERIZATION     The ejection fraction is around 50%  . CHOLECYSTECTOMY    . CORONARY STENT PLACEMENT    . ESOPHAGOGASTRODUODENOSCOPY N/A 12/12/2012   Procedure: ESOPHAGOGASTRODUODENOSCOPY (EGD);  Surgeon: [name], MD;  Location: [name];  Service: Endoscopy;  Laterality: N/A;  . FLEXIBLE SIGMOIDOSCOPY N/A 12/12/2012   Procedure: FLEXIBLE SIGMOIDOSCOPY;  Surgeon: [name], MD;  Location: [facility];  Service: Endoscopy;  Laterality: N/A;  . TUBAL LIGATION    . WRIST SURGERY      Social History  Substance Use Topics  . Smoking status: Former Smoker    Quit date: 08/20/1973  . Smokeless tobacco: Never Used  . Alcohol use No    family history includes Alcohol abuse in her maternal uncle and son; Cancer in her son; Diabetes in her brother, daughter, daughter, father, mother, sister, son, son, and son; Emphysema in her brother; Heart attack in her daughter, father, and paternal grandfather; Heart disease in her father; Hyperlipidemia in her brother, father, and sister; Hypertension in her brother, father, and sister; Suicidality in her son.   Medications: Current Outpatient  Prescriptions  Medication Sig Dispense Refill  . atorvastatin (LIPITOR) 20 MG tablet Take 20 mg by mouth daily.     . carvedilol (COREG) 3.125 MG tablet Take 1 tablet (3.125 mg total) by mouth 2 (two)  times daily with a meal. 60 tablet 0  . cycloSPORINE (RESTASIS) 0.05 % ophthalmic emulsion Place 1 drop into both eyes 2 (two) times daily.    . furosemide (LASIX) 40 MG tablet Take 1/2 tablet daily (Patient taking differently: Take 20 mg by mouth daily. Take 1/2 tablet daily) 30 tablet 6  . insulin aspart (NOVOLOG) 100 UNIT/ML injection 20 units into skin 3 times daily before meals, adjust and increase accordingly. Disp: 6 pens ( 30 d) 1 RF 6 vial 1  . Insulin Glargine (LANTUS SOLOSTAR) 100 UNIT/ML Solostar Pen Patient is 64 units at bedtime and 30 units 12 hrs later. 30 d supply Please include QS 3 months. 8 pen 1  . levothyroxine (SYNTHROID, LEVOTHROID) 112 MCG tablet Take 112 mcg by mouth daily before breakfast.    . losartan (COZAAR) 25 MG tablet TAKE 1 TABLET BY MOUTH EVERY DAY 30 tablet 11  . montelukast (SINGULAIR) 10 MG tablet Take 10 mg by mouth daily.    . nitroGLYCERIN (NITROSTAT) 0.4 MG SL tablet Place 1 tablet (0.4 mg total) under the tongue every 5 (five) minutes as needed. Chest pain (Patient taking differently: Place 0.4 mg under the tongue every 5 (five) minutes as needed for chest pain. Chest pain) 25 tablet 6  . ONE TOUCH ULTRA TEST test strip 1 strip 3 (three) times daily.    . pantoprazole (PROTONIX) 40 MG tablet Take 1 tablet (40 mg total) by mouth 2 (two) times daily before a meal. 62 tablet 0  . potassium chloride (KLOR-CON) 10 MEQ CR tablet Take 10 mEq by mouth 2 (two) times daily.     Dema [place]in Petrolatum-Mineral Oil (SYSTANE NIGHTTIME) OINT Place 1 application into both eyes at bedtime as needed (irritation).     . Cholecalciferol (VITAMIN D3) 5000 units TABS 5,000 IU OTC vitamin D3 daily. 90 tablet 3  . escitalopram (LEXAPRO) 10 MG tablet Take 1 tablet by mouth daily.    .  traMADol (ULTRAM) 50 MG tablet Take 1 tablet by mouth every 6 (six) hours as needed.    . Vitamin D, Ergocalciferol, (DRISDOL) 50000 units CAPS capsule Take 1 capsule (50,000 Units total) by mouth every 7 (seven) days. 12 capsule 20   No current facility-administered medications for this visit.     Allergies:  Allergies  Allergen Reactions  . Ace Inhibitors Cough  . Benadryl [Diphenhydramine Hcl (Sleep)] Other (See Comments)    hypotension  . Penicillins Hives and Swelling    Swelling of arms Has patient had a PCN reaction causing immediate rash, facial/tongue/throat swelling, SOB or lightheadedness with hypotension: unknown Has patient had a PCN reaction causing severe rash involving mucus membranes or skin necrosis: no Has patient had a PCN reaction that required hospitalization: unknown Has patient had a PCN reaction occurring within the last 10 years: no, childhood allergy If all of the above answers are "NO", then may proceed with Cephalosporin use.   . Sulfonamide Derivatives Swelling     ROS: Review of Systems  Constitutional: Negative.   HENT: Negative.   Eyes: Negative.   Respiratory: Negative.   Cardiovascular: Negative.   Gastrointestinal: Negative.   Genitourinary: Negative.   Musculoskeletal: Negative.   Skin: Negative.   Neurological: Negative.   Endo/Heme/Allergies: Negative.   Psychiatric/Behavioral: Negative.    Const:    no fevers, chills Eyes:    conjunctiva clear, no vision changes or blurred vision ENT:  no hearing difficulties, no dysphagia, no dysphonia, no nose bleeds CV:   no  chest pain, arrhythmias, no orthopnea, no PND Pulm:   no SOB at rest or exertion, no Wheeze, no DIB, no hemoptysis GI:    no N/V/D/C, no abd pain GU:   no blood in urine or inc freq or urgency Heme/Onc:    no unexplained bleeding, no night sweats, no more fatigue than usual Neuro:   No dizziness, no LOC, No unexplained weakness or numbness Endo:   no unexplained wt loss or  gain M-Sk:   no localized myalgias or arthralgias Psych:    No SI/HI, no memory prob or unexplained confusion   Objective:  Blood pressure (!) 95/52, pulse (!) 52, height 5' (1.524 m), weight 181 lb 11.2 oz (82.4 kg). Body mass index is 35.49 kg/m. Gen:   Well NAD, A and O *3 HEENT:    [place]/AT, EOMI,  MMM, OP- clr Lungs:   Normal work of breathing. ECTA B/L inc exp phase, no Wh, no rhonchi Heart:   RRR, S1, S2 WNL's Abd:   No gross distention Exts:    warm, pink,  Brisk capillary refill, warm and well perfused.  Psych:    No HI/SI, judgement and insight good, Euthymic mood. Full Affect.   Recent Results (from the past 2160 hour(s))  POCT glycosylated hemoglobin (Hb A1C)     Status: Abnormal   Collection Time: 03/13/16  1:42 PM  Result Value Ref Range   Hemoglobin A1C 10.5   POCT glucose (manual entry)     Status: Abnormal   Collection Time: 03/13/16  1:42 PM  Result Value Ref Range   POC Glucose 338 (A) 70 - 99 mg/dl  Microalbumin / creatinine urine ratio     Status: None   Collection Time: 03/13/16  2:40 PM  Result Value Ref Range   Creatinine, Urine 20 20 - 320 mg/dL   Microalb, Ur <0.2 Not estab mg/dL   Microalb Creat Ratio SEE NOTE <30 mcg/mg creat    Comment:   The microalbumin value is less than 0.2 mg/dL therefore we are unable to calculate excretion and/or creatinine ratio.   The ADA has defined abnormalities in albumin excretion as follows:           Category           Result                            (mcg/mg creatinine)                 Normal:    <30       Microalbuminuria:    30 - 299   Clinical albuminuria:    > or = 300   The ADA recommends that at least two of three specimens collected within a 3 - 6 month period be abnormal before considering a patient to be within a diagnostic category.     CBC with Differential/Platelet     Status: Abnormal   Collection Time: 03/20/16  8:06 AM  Result Value Ref Range   WBC 3.2 (L) 3.8 - 10.8 K/uL   RBC 5.15 (H)  3.80 - 5.10 MIL/uL   Hemoglobin 13.5 11.7 - 15.5 g/dL   HCT 42.7 35.0 - 45.0 %   MCV 82.9 80.0 - 100.0 fL   MCH 26.2 (L) 27.0 - 33.0 pg   MCHC 31.6 (L) 32.0 - 36.0 g/dL   RDW 15.4 (H) 11.0 - 15.0 %   Platelets 61 (L) 140 - 400 K/uL  Comment: Platelet count confirmed on smear   MPV Not Performed 7.5 - 12.5 fL   Neutro Abs 1,792 1,500 - 7,800 cells/uL   Lymphs Abs 864 850 - 3,900 cells/uL   Monocytes Absolute 416 200 - 950 cells/uL   Eosinophils Absolute 96 15 - 500 cells/uL   Basophils Absolute 32 0 - 200 cells/uL   Neutrophils Relative % 56 %   Lymphocytes Relative 27 %   Monocytes Relative 13 %   Eosinophils Relative 3 %   Basophils Relative 1 %   Smear Review Criteria for review not met     Comment: ** Please note change in unit of measure and reference range(s). **  COMPLETE METABOLIC PANEL WITH GFR     Status: Abnormal   Collection Time: 03/20/16  8:06 AM  Result Value Ref Range   Sodium 137 135 - 146 mmol/L   Potassium 5.2 3.5 - 5.3 mmol/L   Chloride 103 98 - 110 mmol/L   CO2 27 20 - 31 mmol/L   Glucose, Bld 170 (H) 65 - 99 mg/dL   BUN 9 7 - 25 mg/dL   Creat 0.93 0.60 - 0.93 mg/dL    Comment:   For patients > or = 80 years of age: The upper reference limit for Creatinine is approximately 13% higher for people identified as African-American.      Total Bilirubin 0.8 0.2 - 1.2 mg/dL   Alkaline Phosphatase 192 (H) 33 - 130 U/L   AST 23 10 - 35 U/L   ALT 16 6 - 29 U/L   Total Protein 7.0 6.1 - 8.1 g/dL   Albumin 3.5 (L) 3.6 - 5.1 g/dL   Calcium 8.7 8.6 - 10.4 mg/dL   GFR, Est African American 68 >=60 mL/min   GFR, Est Non African American 59 (L) >=60 mL/min  TSH     Status: None   Collection Time: 03/20/16  8:06 AM  Result Value Ref Range   TSH 0.71 mIU/L    Comment:   Reference Range   > or = 20 Years  0.40-4.50   Pregnancy Range First trimester  0.26-2.66 Second trimester 0.55-2.73 Third trimester  0.43-2.91     VITAMIN D 25 Hydroxy (Vit-D  Deficiency, Fractures)     Status: Abnormal   Collection Time: 03/20/16  8:06 AM  Result Value Ref Range   Vit D, 25-Hydroxy 14 (L) 30 - 100 ng/mL    Comment: Vitamin D Status           25-OH Vitamin D        Deficiency                <20 ng/mL        Insufficiency         20 - 29 ng/mL        Optimal             > or = 30 ng/mL   For 25-OH Vitamin D testing on patients on D2-supplementation and patients for whom quantitation of D2 and D3 fractions is required, the Quest[facility] 25-OH VIT D, (D2,D3), LC/MS/MS is recommended: order code [id number] (patients > 2 yrs).   COMPLETE METABOLIC PANEL WITH GFR     Status: Abnormal   Collection Time: 05/02/16  7:58 AM  Result Value Ref Range   Sodium 137 135 - 146 mmol/L   Potassium 4.4 3.5 - 5.3 mmol/L   Chloride 105 98 - 110 mmol/L   CO2 23 20 - 31  mmol/L   Glucose, Bld 93 65 - 99 mg/dL   BUN 9 7 - 25 mg/dL   Creat 0.82 0.60 - 0.93 mg/dL    Comment:   For patients > or = 80 years of age: The upper reference limit for Creatinine is approximately 13% higher for people identified as African-American.      Total Bilirubin 0.8 0.2 - 1.2 mg/dL   Alkaline Phosphatase 207 (H) 33 - 130 U/L   AST 25 10 - 35 U/L   ALT 21 6 - 29 U/L   Total Protein 6.8 6.1 - 8.1 g/dL   Albumin 3.5 (L) 3.6 - 5.1 g/dL   Calcium 8.7 8.6 - 10.4 mg/dL   GFR, Est African American 79 >=60 mL/min   GFR, Est Non African American 68 >=60 mL/min  CBC w/Diff     Status: Abnormal   Collection Time: 05/02/16  7:58 AM  Result Value Ref Range   WBC 4.9 3.8 - 10.8 K/uL   RBC 5.01 3.80 - 5.10 MIL/uL   Hemoglobin 13.2 11.7 - 15.5 g/dL   HCT 40.3 35.0 - 45.0 %   MCV 80.4 80.0 - 100.0 fL   MCH 26.3 (L) 27.0 - 33.0 pg   MCHC 32.8 32.0 - 36.0 g/dL   RDW 16.6 (H) 11.0 - 15.0 %   Platelets 85 (L) 140 - 400 K/uL    Comment: Platelet count confirmed on smear   Neutro Abs 3,136 1,500 - 7,800 cells/uL   Lymphs Abs 833 (L) 850 - 3,900 cells/uL   Monocytes Absolute 784 200 - 950  cells/uL   Eosinophils Absolute 147 15 - 500 cells/uL   Basophils Absolute 0 0 - 200 cells/uL   Neutrophils Relative % 64 %   Lymphocytes Relative 17 %   Monocytes Relative 16 %   Eosinophils Relative 3 %   Basophils Relative 0 %   Smear Review Criteria for review not met   Lipid Profile     Status: None   Collection Time: 05/02/16  7:58 AM  Result Value Ref Range   Cholesterol 136 125 - 200 mg/dL   Triglycerides 125 <150 mg/dL   HDL 55 >=46 mg/dL   Total CHOL/HDL Ratio 2.5 <=5.0 Ratio   VLDL 25 <30 mg/dL   LDL Cholesterol 56 <130 mg/dL    Comment:   Total Cholesterol/HDL Ratio:CHD Risk                        Coronary Heart Disease Risk Table                                        Men       Women          1/2 Average Risk              3.4        3.3              Average Risk              5.0        4.4           2X Average Risk              9.6        7.1           3X Average Risk  23.4       11.0 Use the calculated Patient Ratio above and the CHD Risk table  to determine the patient's CHD Risk.   To look at all this she had to look

## 2016-05-11 NOTE — Assessment & Plan Note (Addendum)
Patient's platelets have been extremely stable for many, many years.  Back in 2014 they were 69, then 80, then in May 2017, they were 59, then recently 85. They have been fluctuating in this range for as long as patient can recall.

## 2016-05-11 NOTE — Patient Instructions (Addendum)
Try to walk even daily- it will help yoru mood, strength, bones, blood sugars and bld pressures.       Blood Glucose Monitoring, Adult Monitoring your blood glucose (also know as blood sugar) helps you to manage your diabetes. It also helps you and your health care provider monitor your diabetes and determine how well your treatment plan is working. WHY SHOULD YOU MONITOR YOUR BLOOD GLUCOSE?  It can help you understand how food, exercise, and medicine affect your blood glucose.  It allows you to know what your blood glucose is at any given moment. You can quickly tell if you are having low blood glucose (hypoglycemia) or high blood glucose (hyperglycemia).  It can help you and your health care provider know how to adjust your medicines.  It can help you understand how to manage an illness or adjust medicine for exercise. WHEN SHOULD YOU TEST? Your health care provider will help you decide how often you should check your blood glucose. This may depend on the type of diabetes you have, your diabetes control, or the types of medicines you are taking. Be sure to write down all of your blood glucose readings so that this information can be reviewed with your health care provider. See below for examples of testing times that your health care provider may suggest. Type 1 Diabetes  Test at least 2 times per day if your diabetes is well controlled, if you are using an insulin pump, or if you perform multiple daily injections.  If your diabetes is not well controlled or if you are sick, you may need to test more often.  It is a good idea to also test:  Before every insulin injection.  Before and after exercise.  Between meals and 2 hours after a meal.  Occasionally between 2:00 a.m. and 3:00 a.m. Type 2 Diabetes  If you are taking insulin, test at least 2 times per day. However, it is best to test before every insulin injection.  If you take medicines by mouth (orally), test 2  times a day.  If you are on a controlled diet, test once a day.  If your diabetes is not well controlled or if you are sick, you may need to monitor more often. HOW TO MONITOR YOUR BLOOD GLUCOSE Supplies Needed  Blood glucose meter.  Test strips for your meter. Each meter has its own strips. You must use the strips that go with your own meter.  A pricking needle (lancet).  A device that holds the lancet (lancing device).  A journal or log book to write down your results. Procedure  Wash your hands with soap and water. Alcohol is not preferred.  Prick the side of your finger (not the tip) with the lancet.  Gently milk the finger until a small drop of blood appears.  Follow the instructions that come with your meter for inserting the test strip, applying blood to the strip, and using your blood glucose meter. Other Areas to Get Blood for Testing Some meters allow you to use other areas of your body (other than your finger) to test your blood. These areas are called alternative sites. The most common alternative sites are:  The forearm.  The thigh.  The back area of the lower leg.  The palm of the hand. The blood flow in these areas is slower. Therefore, the blood glucose values you get may be delayed, and the numbers are different from what you would get from your fingers. Do  not use alternative sites if you think you are having hypoglycemia. Your reading will not be accurate. Always use a finger if you are having hypoglycemia. Also, if you cannot feel your lows (hypoglycemia unawareness), always use your fingers for your blood glucose checks. ADDITIONAL TIPS FOR GLUCOSE MONITORING  Do not reuse lancets.  Always carry your supplies with you.  All blood glucose meters have a 24-hour "hotline" number to call if you have questions or need help.  Adjust (calibrate) your blood glucose meter with a control solution after finishing a few boxes of strips. BLOOD GLUCOSE RECORD  KEEPING It is a good idea to keep a daily record or log of your blood glucose readings. Most glucose meters, if not all, keep your glucose records stored in the meter. Some meters come with the ability to download your records to your home computer. Keeping a record of your blood glucose readings is especially helpful if you are wanting to look for patterns. Make notes to go along with the blood glucose readings because you might forget what happened at that exact time. Keeping good records helps you and your health care provider to work together to achieve good diabetes management.    This information is not intended to replace advice given to you by your health care provider. Make sure you discuss any questions you have with your health care provider.   Document Released: 08/09/2003 Document Revised: 08/27/2014 Document Reviewed: 12/29/2012 Elsevier Interactive Patient Education 2016 [employer].    Diabetes and Exercise Exercising regularly is important. It is not just about losing weight. It has many health benefits, such as:  Improving your overall fitness, flexibility, and endurance.  Increasing your bone density.  Helping with weight control.  Decreasing your body fat.  Increasing your muscle strength.  Reducing stress and tension.  Improving your overall health. People with diabetes who exercise gain additional benefits because exercise:  Reduces appetite.  Improves the body's use of blood sugar (glucose).  Helps lower or control blood glucose.  Decreases blood pressure.  Helps control blood lipids (such as cholesterol and triglycerides).  Improves the body's use of the hormone insulin by:  Increasing the body's insulin sensitivity.  Reducing the body's insulin needs.  Decreases the risk for heart disease because exercising:  Lowers cholesterol and triglycerides levels.  Increases the levels of good cholesterol (such as high-density lipoproteins [HDL]) in the  body.  Lowers blood glucose levels. YOUR ACTIVITY PLAN  Choose an activity that you enjoy, and set realistic goals. To exercise safely, you should begin practicing any new physical activity slowly, and gradually increase the intensity of the exercise over time. Your health care provider or diabetes educator can help create an activity plan that works for you. General recommendations include:  Encouraging children to engage in at least 60 minutes of physical activity each day.  Stretching and performing strength training exercises, such as yoga or weight lifting, at least 2 times per week.  Performing a total of at least 150 minutes of moderate-intensity exercise each week, such as brisk walking or water aerobics.  Exercising at least 3 days per week, making sure you allow no more than 2 consecutive days to pass without exercising.  Avoiding long periods of inactivity (90 minutes or more). When you have to spend an extended period of time sitting down, take frequent breaks to walk or stretch. RECOMMENDATIONS FOR EXERCISING WITH TYPE 1 OR TYPE 2 DIABETES   Check your blood glucose before exercising. If blood  glucose levels are greater than 240 mg/dL, check for urine ketones. Do not exercise if ketones are present.  Avoid injecting insulin into areas of the body that are going to be exercised. For example, avoid injecting insulin into:  The arms when playing tennis.  The legs when jogging.  Keep a record of:  Food intake before and after you exercise.  Expected peak times of insulin action.  Blood glucose levels before and after you exercise.  The type and amount of exercise you have done.  Review your records with your health care provider. Your health care provider will help you to develop guidelines for adjusting food intake and insulin amounts before and after exercising.  If you take insulin or oral hypoglycemic agents, watch for signs and symptoms of hypoglycemia. They  include:  Dizziness.  Shaking.  Sweating.  Chills.  Confusion.  Drink plenty of water while you exercise to prevent dehydration or heat stroke. Body water is lost during exercise and must be replaced.  Talk to your health care provider before starting an exercise program to make sure it is safe for you. Remember, almost any type of activity is better than none.   This information is not intended to replace advice given to you by your health care provider. Make sure you discuss any questions you have with your health care provider.   Document Released: 10/27/2003 Document Revised: 12/21/2014 Document Reviewed: 01/13/2013 Elsevier Interactive Patient Education [employer]

## 2016-05-11 NOTE — Assessment & Plan Note (Signed)
Stable- pt w/o symptoms, CBC stable

## 2016-05-11 NOTE — Assessment & Plan Note (Signed)
- 

## 2016-05-14 ENCOUNTER — Other Ambulatory Visit: Payer: Self-pay | Admitting: Family Medicine

## 2016-05-14 ENCOUNTER — Ambulatory Visit (INDEPENDENT_AMBULATORY_CARE_PROVIDER_SITE_OTHER): Payer: PPO | Admitting: Ophthalmology

## 2016-05-14 NOTE — Telephone Encounter (Signed)
Opened in error

## 2016-05-24 ENCOUNTER — Other Ambulatory Visit: Payer: Self-pay | Admitting: Family Medicine

## 2016-05-25 NOTE — Telephone Encounter (Signed)
We gave the patient her last refill however, she has a cardiologist.  Did you want refills for carvediol to be sent to Dr. Elease HashimotoNahser?

## 2016-05-29 DIAGNOSIS — E7800 Pure hypercholesterolemia, unspecified: Secondary | ICD-10-CM | POA: Diagnosis not present

## 2016-05-29 DIAGNOSIS — E1165 Type 2 diabetes mellitus with hyperglycemia: Secondary | ICD-10-CM | POA: Diagnosis not present

## 2016-05-29 DIAGNOSIS — E039 Hypothyroidism, unspecified: Secondary | ICD-10-CM | POA: Diagnosis not present

## 2016-05-29 LAB — HEMOGLOBIN A1C: Hgb A1c MFr Bld: 10 — AB (ref 4.0–6.0)

## 2016-06-06 ENCOUNTER — Other Ambulatory Visit: Payer: Self-pay | Admitting: Family Medicine

## 2016-06-06 ENCOUNTER — Other Ambulatory Visit (INDEPENDENT_AMBULATORY_CARE_PROVIDER_SITE_OTHER): Payer: PPO

## 2016-06-06 DIAGNOSIS — R748 Abnormal levels of other serum enzymes: Secondary | ICD-10-CM | POA: Diagnosis not present

## 2016-06-06 DIAGNOSIS — Z23 Encounter for immunization: Secondary | ICD-10-CM | POA: Diagnosis not present

## 2016-06-07 LAB — GAMMA GT: GGT: 96 U/L — ABNORMAL HIGH (ref 7–51)

## 2016-06-07 LAB — PARATHYROID HORMONE, INTACT (NO CA): PTH: 63 pg/mL (ref 14–64)

## 2016-06-09 LAB — NUCLEOTIDASE, 5', BLOOD: 5-Nucleotidase: 16 U/L — ABNORMAL HIGH (ref 0–10)

## 2016-06-12 DIAGNOSIS — I10 Essential (primary) hypertension: Secondary | ICD-10-CM | POA: Diagnosis not present

## 2016-06-12 DIAGNOSIS — E7800 Pure hypercholesterolemia, unspecified: Secondary | ICD-10-CM | POA: Diagnosis not present

## 2016-06-12 DIAGNOSIS — E039 Hypothyroidism, unspecified: Secondary | ICD-10-CM | POA: Diagnosis not present

## 2016-06-12 DIAGNOSIS — E1165 Type 2 diabetes mellitus with hyperglycemia: Secondary | ICD-10-CM | POA: Diagnosis not present

## 2016-06-19 DIAGNOSIS — E1165 Type 2 diabetes mellitus with hyperglycemia: Secondary | ICD-10-CM | POA: Diagnosis not present

## 2016-06-20 ENCOUNTER — Ambulatory Visit (INDEPENDENT_AMBULATORY_CARE_PROVIDER_SITE_OTHER): Payer: PPO | Admitting: Family Medicine

## 2016-06-20 ENCOUNTER — Encounter: Payer: Self-pay | Admitting: Family Medicine

## 2016-06-20 VITALS — Ht 60.0 in | Wt 170.8 lb

## 2016-06-20 DIAGNOSIS — IMO0001 Reserved for inherently not codable concepts without codable children: Secondary | ICD-10-CM

## 2016-06-20 DIAGNOSIS — E669 Obesity, unspecified: Secondary | ICD-10-CM

## 2016-06-20 DIAGNOSIS — R748 Abnormal levels of other serum enzymes: Secondary | ICD-10-CM

## 2016-06-20 DIAGNOSIS — E118 Type 2 diabetes mellitus with unspecified complications: Secondary | ICD-10-CM | POA: Diagnosis not present

## 2016-06-20 DIAGNOSIS — I7090 Unspecified atherosclerosis: Secondary | ICD-10-CM

## 2016-06-20 DIAGNOSIS — I708 Atherosclerosis of other arteries: Secondary | ICD-10-CM | POA: Diagnosis not present

## 2016-06-20 DIAGNOSIS — E782 Mixed hyperlipidemia: Secondary | ICD-10-CM

## 2016-06-20 DIAGNOSIS — J42 Unspecified chronic bronchitis: Secondary | ICD-10-CM

## 2016-06-20 DIAGNOSIS — I5032 Chronic diastolic (congestive) heart failure: Secondary | ICD-10-CM

## 2016-06-20 DIAGNOSIS — I2583 Coronary atherosclerosis due to lipid rich plaque: Secondary | ICD-10-CM

## 2016-06-20 DIAGNOSIS — F329 Major depressive disorder, single episode, unspecified: Secondary | ICD-10-CM

## 2016-06-20 DIAGNOSIS — F32A Depression, unspecified: Secondary | ICD-10-CM

## 2016-06-20 DIAGNOSIS — I2510 Atherosclerotic heart disease of native coronary artery without angina pectoris: Secondary | ICD-10-CM

## 2016-06-20 DIAGNOSIS — Z794 Long term (current) use of insulin: Secondary | ICD-10-CM

## 2016-06-20 DIAGNOSIS — E039 Hypothyroidism, unspecified: Secondary | ICD-10-CM

## 2016-06-20 LAB — POCT GLYCOSYLATED HEMOGLOBIN (HGB A1C): Hemoglobin A1C: 10.6

## 2016-06-20 NOTE — Progress Notes (Signed)
Assessment and plan:  1. Obesity, Class I, BMI 30-34.9   2. Insulin dependent diabetes mellitus with complications (HCC)   3. Atherosclerosis of carotid arteries   4. Elevated serum alkaline phosphatase level   5. Heart failure, diastolic, chronic   6. Depression, unspecified depression type   7. Hypothyroidism, unspecified type   8. Mixed hyperlipidemia   9. Coronary artery disease due to lipid rich plaque   10. Chronic bronchitis, unspecified chronic bronchitis type (HCC)     Atherosclerosis of carotid arteries Carotid US 11/16: Results: "Stable heterogeneous plaque, bilaterally. Stable 1-39% bilateral ICA stenosis. Normal subclavian arteries, bilaterally. Patent vertebral arteries with antegrade flow. f/u 2 years."  Elevated serum alkaline phosphatase level- hepatocellular in origin Pt will speak to her GI doc Lynann Bologna MD) about this.   Results of labs sent to him as well.    -D/c pt that it appears to be hepatic in nature, not from bone  Insulin dependent diabetes mellitus with complications (HCC) Cont Mgt plan per Dr Talmage Nap  - Continue to closely monitor blood sugars and try to walk 10-20 minutes daily and eat a prudent diet.   - Counseled patient on pathophysiology of disease and discussed various treatment options, which often includes dietary and lifestyle modifications as first line.  Importance of low carb/ketogenic diet discussed with patient in addition to regular exercise.   - Check FBS and 2 hours after the biggest meal of your day.  Keep log and bring in next OV for my review.   Also, if you ever feel poorly, please check your blood pressure and blood sugar, as one or the other could be the cause of your symptoms.  - Being a diabetic, reminded pt need yearly eye and foot exams. Make appt.for diabetic eye exam  Coronary artery disease Heart attack in past with stent placement. Very stable  for many years. Sees Dr.Nahser    Heart failure, diastolic, chronic (HCC) Txmnt per Cardiology.  Encouraged pt to make f/up appt wih them.    I rec once yrly-  Unless they advise differrently  Depression Mood stable- on lexapro  Hypothyroidism Levels are stable, continue current medications.  Mixed hyperlipidemia Cholesterol levels - best they've looked in years.  Continue current regimen.  Encouraged low saturated and trans fats  COPD (chronic obstructive pulmonary disease) (HCC) Sx stable, pt sees Dr. Bonney Aid  Obesity, Class I, BMI 30-34.9 Explained to patient what BMI refers to, and what it means medically.    Told patient to think about it as a "medical risk stratification measurement" and how increasing BMI is associated with increasing risk/ or worsening state of various diseases such as hypertension, hyperlipidemia, diabetes, OA, depression, OSA etc.  American Heart Association guidelines for healthy diet, basically Mediterranean diet, and exercise guidelines of 30 minutes 5 days per week or more, discussed with pt.  1-2 lb wt loss per week recommended.    Declines nutrition referral   RTC for yearly complete physical exam and appropriate screening tests etc,  in addition to chronic disease mgt  Orders Placed This Encounter  Procedures  . POCT glycosylated hemoglobin (Hb A1C)   Pt was in the office today for 40+ minutes, with over 50% time spent in face to face counseling of various medical concerns and in coordination of care New Prescriptions   No medications on file    Modified Medications   No medications on file    Discontinued Medications  No medications on file    Return in about 3 months (around 09/20/2016) for Follow-up of current medical issues.  Anticipatory guidance and routine counseling done re: condition, txmnt options and need for follow up. All questions of patient's were answered.   Gross side effects, risk and benefits, and alternatives  of medications discussed with patient.  Patient is aware that all medications have potential side effects and we are unable to predict every sideeffect or drug-drug interaction that may occur.  Expresses verbal understanding and consents to current therapy plan and treatment regiment.  Please see AVS handed out to patient at the end of our visit for additional patient instructions/ counseling done pertaining to today's office visit.  Note: This document was prepared using Dragon voice recognition software and may include unintentional dictation errors.   ----------------------------------------------------------------------------------------------------------------------  Subjective:   CC:   [name] is a 82 y.o. female who presents to [facility] today for review and discussion of recent bloodwork that was done.  1. Elevated serum alkaline phosphatase level:   To differentiate between bone and liver origin, I obtained GGT and serum 5 nucleotidase as well as parathyroid hormone.  ( Ca and Vit D already obtained)  Pt's alkaline phosphatase levels were even higher in 2013 ranging from 232, then 255 and also 268.   So, that is reassuring.  Pt not sure her GI doc has evaluated her for this.  We sent him a copy of her recent labs for her review  2.          Dm:  10.6 today.  Pt saw Her endocrinologist Dr. Talmage Nap, who is txing pt for her DM,  a couple times since I last saw her.  They're changing her treatment regimen around especially focusing on diet.    Not walking yet.  Did not bring in BS log for me.   FBS 260 this am- highest, lowest was 50 in middle of the night.   Per pt--> she talked to dr balan about this and they made some changes.   3.       HTN:    We dec coreg dose last OV.   No current sx dizziness/ off balance etc now.  No new onset of chest pain, shortness of breath, orthopnea, paroxysmal nocturnal dyspnea, dyspnea on exertion. No increased peripheral  edema.   -  Told pt to F/up w/ Dr Elease Hashimoto- her Cards doc in near future.  Patient relays that even as soon as a year ago she used to have some difficulties keeping her blood pressure up with even diastolics in the 30s and nearly passing out because of it. She feels much better since I decreased her carvedilol dose.    Wt Readings from Last 3 Encounters:  06/20/16 170 lb 12.8 oz (77.5 kg)  05/11/16 181 lb 11.2 oz (82.4 kg)  03/13/16 182 lb 12.8 oz (82.9 kg)   BP Readings from Last 3 Encounters:  05/11/16 (!) 95/52  03/13/16 (!) 116/50  01/13/16 155/63   Pulse Readings from Last 3 Encounters:  05/11/16 (!) 52  03/13/16 (!) 58  01/13/16 (S) 62   BMI Readings from Last 3 Encounters:  06/20/16 33.36 kg/m  05/11/16 35.49 kg/m  03/13/16 35.70 kg/m     Patient Care Team    Relationship Specialty Notifications Start End  Thomasene Lot, DO PCP - General Family Medicine  03/12/16   Kalman Shan, MD Attending Physician Pulmonary Disease Abnormal results only,  Admissions 12/07/12   Marcellus Scott, MD Attending Physician Pulmonary Disease  05/01/16   Vesta Mixer, MD Consulting Physician Cardiology  05/01/16   Lynann Bologna, MD Referring Physician Gastroenterology  05/01/16     Full medical history updated and reviewed in the office today  Patient Active Problem List   Diagnosis Date Noted  . Obesity, Class I, BMI 30-34.9 06/23/2016    Priority: High  . Depression 03/13/2016    Priority: High  . Hypothyroidism 03/13/2016    Priority: High  . Insulin dependent diabetes mellitus with complications (HCC) 03/13/2016    Priority: High  . Mixed hyperlipidemia 03/13/2016    Priority: High  . Coronary artery disease 04/11/2011    Priority: Medium  . COPD (chronic obstructive pulmonary disease) (HCC) 04/11/2011    Priority: Medium  . Obstructive sleep apnea 04/11/2011    Priority: Medium  . Heart failure, diastolic, chronic (HCC) 03/29/2008    Priority: Medium  . Vitamin D  deficiency 05/11/2016    Priority: Low  . Atherosclerosis of carotid arteries 04/03/2006    Priority: Low  . Chronic leukopenia 05/11/2016  . Elevated serum alkaline phosphatase level- hepatocellular in origin 05/11/2016  . Constipation, chronic 03/13/2016  . At high risk for osteoporosis 03/13/2016  . Hypotension, unspecified 12/13/2012  . Hypokalemia 12/13/2012  . Thrombocytopenia, unspecified (HCC) 12/13/2012  . h/o Clostridium difficile colitis 12/12/2012  . History of GI bleed 12/12/2012  . ILD (interstitial lung disease) (HCC) 11/25/2012    Past Medical History:  Diagnosis Date  . Cirrhosis, non-alcoholic (HCC)   . Coronary artery disease    status post inferior wall myocardial infarction  . Diabetes mellitus   . Dyslipidemia   . Hypotension 08/02/2010   recent episodes of orthostatic hypotension  . Hypothyroidism   . Leg edema   . Myocardial infarct, old   . Neuropathy of hand    left hand numbness/neuropathy  . Obesity   . Sleep apnea     Past Surgical History:  Procedure Laterality Date  . ABDOMINAL HYSTERECTOMY    . CARDIAC CATHETERIZATION     The ejection fraction is around 50%  . CHOLECYSTECTOMY    . CORONARY STENT PLACEMENT    . ESOPHAGOGASTRODUODENOSCOPY N/A 12/12/2012   Procedure: ESOPHAGOGASTRODUODENOSCOPY (EGD);  Surgeon: Florencia Reasons, MD;  Location: [facility];  Service: Endoscopy;  Laterality: N/A;  . FLEXIBLE SIGMOIDOSCOPY N/A 12/12/2012   Procedure: FLEXIBLE SIGMOIDOSCOPY;  Surgeon: Florencia Reasons, MD;  Location: [facility];  Service: Endoscopy;  Laterality: N/A;  . TUBAL LIGATION    . WRIST SURGERY      Social History  Substance Use Topics  . Smoking status: Former Smoker    Quit date: 08/20/1973  . Smokeless tobacco: Never Used  . Alcohol use No    Family Hx: Family History  Problem Relation Age of Onset  . Emphysema Brother     non smoker  . Diabetes Brother   . Hyperlipidemia Brother   . Hypertension Brother   . Heart  disease Father   . Heart attack Father   . Diabetes Father   . Hyperlipidemia Father   . Hypertension Father   . Diabetes Mother   . Diabetes Sister   . Hyperlipidemia Sister   . Hypertension Sister   . Heart attack Daughter   . Diabetes Daughter   . Diabetes Son   . Suicidality Son   . Alcohol abuse Son   . Alcohol abuse Maternal Uncle   . Heart attack  Paternal Grandfather   . Diabetes Daughter   . Cancer Son     melanoma  . Diabetes Son   . Diabetes Son      Medications: Current Outpatient Prescriptions  Medication Sig Dispense Refill  . ADVAIR DISKUS 250-50 MCG/DOSE AEPB INHALE ONE PUFF TWICE DAILY 60 each 0  . atorvastatin (LIPITOR) 20 MG tablet Take 20 mg by mouth daily.     . carvedilol (COREG) 3.125 MG tablet TAKE ONE TABLET BY MOUTH TWO TIMES DAILY WITH A MEAL. 180 tablet 0  . Cholecalciferol (VITAMIN D3) 5000 units TABS 5,000 IU OTC vitamin D3 daily. 90 tablet 3  . cycloSPORINE (RESTASIS) 0.05 % ophthalmic emulsion Place 1 drop into both eyes 2 (two) times daily.    Marland Kitchen escitalopram (LEXAPRO) 10 MG tablet Take 1 tablet by mouth daily.    . furosemide (LASIX) 40 MG tablet Take 1/2 tablet daily (Patient taking differently: Take 20 mg by mouth daily. Take 1/2 tablet daily) 30 tablet 6  . Insulin Glargine (LANTUS SOLOSTAR) 100 UNIT/ML Solostar Pen Patient is 64 units at bedtime and 30 units 12 hrs later. 30 d supply Please include QS 3 months. 8 pen 1  . levothyroxine (SYNTHROID, LEVOTHROID) 112 MCG tablet Take 112 mcg by mouth daily before breakfast.    . losartan (COZAAR) 25 MG tablet TAKE 1 TABLET BY MOUTH EVERY DAY 30 tablet 11  . montelukast (SINGULAIR) 10 MG tablet Take 10 mg by mouth daily.    . nitroGLYCERIN (NITROSTAT) 0.4 MG SL tablet Place 1 tablet (0.4 mg total) under the tongue every 5 (five) minutes as needed. Chest pain (Patient taking differently: Place 0.4 mg under the tongue every 5 (five) minutes as needed for chest pain. Chest pain) 25 tablet 6  . NOVOLOG  FLEXPEN 100 UNIT/ML FlexPen INJECT 20 UNITS SUBCUTANEOUSLY THREE TIMES A DAY BEFORE MEALS; ADJUST AND INCREASE ACCORDINGLY 6 pen 1  . ONE TOUCH ULTRA TEST test strip TEST BLOOD SUGAR 2-3 TIMES A DAY 100 each 11  . pantoprazole (PROTONIX) 40 MG tablet Take 1 tablet (40 mg total) by mouth 2 (two) times daily before a meal. 62 tablet 0  . potassium chloride (KLOR-CON) 10 MEQ CR tablet Take 10 mEq by mouth 2 (two) times daily.     . traMADol (ULTRAM) 50 MG tablet Take 1 tablet by mouth every 6 (six) hours as needed.    . Vitamin D, Ergocalciferol, (DRISDOL) 50000 units CAPS capsule Take 1 capsule (50,000 Units total) by mouth every 7 (seven) days. 12 capsule 20  . White Petrolatum-Mineral Oil (SYSTANE NIGHTTIME) OINT Place 1 application into both eyes at bedtime as needed (irritation).     Marland Kitchen PROAIR HFA 108 (90 Base) MCG/ACT inhaler Inhale 2 puffs into the lungs every 6 (six) hours as needed.     No current facility-administered medications for this visit.     Allergies:  Allergies  Allergen Reactions  . Ace Inhibitors Cough  . Benadryl [Diphenhydramine Hcl (Sleep)] Other (See Comments)    hypotension  . Penicillins Hives and Swelling    Swelling of arms Has patient had a PCN reaction causing immediate rash, facial/tongue/throat swelling, SOB or lightheadedness with hypotension: unknown Has patient had a PCN reaction causing severe rash involving mucus membranes or skin necrosis: no Has patient had a PCN reaction that required hospitalization: unknown Has patient had a PCN reaction occurring within the last 10 years: no, childhood allergy If all of the above answers are "NO", then may proceed with  Cephalosporin use.   . Sulfonamide Derivatives Swelling     ROS: Review of Systems  Constitutional: Negative.  Negative for chills, diaphoresis, fever, malaise/fatigue and weight loss.  HENT: Negative.  Negative for congestion, sore throat and tinnitus.   Eyes: Negative.  Negative for blurred  vision, double vision and photophobia.  Respiratory: Negative.  Negative for cough and wheezing.   Cardiovascular: Negative.  Negative for chest pain and palpitations.  Gastrointestinal: Negative.  Negative for blood in stool, diarrhea, nausea and vomiting.  Genitourinary: Negative.  Negative for dysuria, frequency and urgency.  Musculoskeletal: Negative.  Negative for joint pain and myalgias.  Skin: Negative.  Negative for itching and rash.  Neurological: Negative.  Negative for dizziness, focal weakness, weakness and headaches.  Endo/Heme/Allergies: Negative.  Negative for environmental allergies and polydipsia. Does not bruise/bleed easily.  Psychiatric/Behavioral: Negative.  Negative for depression and memory loss. The patient is not nervous/anxious and does not have insomnia.     Objective:  Height 5' (1.524 m), weight 170 lb 12.8 oz (77.5 kg). Body mass index is 33.36 kg/m. Gen:   Well NAD, A and O *3 HEENT:    [place]/AT, EOMI,  MMM, OP- clr Lungs:   Normal work of breathing. Distant but ECTA B/L, no Wh, rhonchi Heart:   Distant but essentially RRR, S1, S2 WNL's, no MRG Abd:   No gross distention Exts:    warm, pink,  Brisk capillary refill, warm and well perfused.  Psych:    No HI/SI, judgement and insight good, Euthymic mood. Full Affect.   Gamma GT     Status: Abnormal   Collection Time: 06/06/16  8:19 AM  Result Value Ref Range   GGT 96 (H) 7 - 51 U/L  Nucleotidase, 5', blood     Status: Abnormal   Collection Time: 06/06/16  8:19 AM  Result Value Ref Range   5-Nucleotidase 16 (H) 0 - 10 U/L  PTH, intact (no Ca)     Status: None   Collection Time: 06/06/16  8:19 AM  Result Value Ref Range   PTH 63 14 - 64 pg/mL    Comment:   Interpretive Guide:                              Intact PTH               Calcium                              ----------               ------- Normal Parathyroid           Normal                   Normal Hypoparathyroidism           Low or Low  Normal        Low Hyperparathyroidism      Primary                 Normal or High           High      Secondary               High                     Normal or Low  Tertiary                High                     High Non-Parathyroid   Hypercalcemia              Low or Low Normal        High   POCT glycosylated hemoglobin (Hb A1C)     Status: Abnormal   Collection Time: 06/20/16 10:24 AM  Result Value Ref Range   Hemoglobin A1C 10.6

## 2016-06-20 NOTE — Patient Instructions (Addendum)
So that your sugars are more stable and don't have super highs and then rebounding super low, try to eat protein with every meal and everything you eat.  --For your blood sugars, always check fasting blood sugar and write it down as well as 2 hours after largest meal of the day. These are the only 2 times you should ever checked her blood sugar other than if you feel badly, always check her sugar      Good Protein Sources Protein can help you shed those unwanted pounds -- and keep your belly full. But it's important to eat the right amount and the right kind of protein to get its health benefits.  Seafood  Seafood is an excellent source of protein because it's usually low in fat. Fish such as salmon is a little higher in fat, but it is the heart-healthy kind: it has omega-3 fatty acids.  White-Meat Poultry  Stick to the [place] meat of poultry for excellent, lean protein. Dark meat is a little higher in fat. The skin is loaded with saturated fat, so remove skin before cooking.  Milk, Cheese, and Yogurt  Not only are dairy foods like milk, cheese, and yogurt excellent sources of protein, but they also contain valuable calcium, and many are fortified with vitamin D. Choose skim or low-fat dairy to keep bones and teeth strong and help prevent osteoporosis.  Eggs  Eggs are one of the least expensive forms of protein. The American Heart Association says normal healthy adults can safely enjoy an egg a day.  Beans  One-half cup of beans contains as much protein as an ounce of broiled steak. Plus, these nutritious nuggets are loaded with fiber to keep you feeling full for hours.  Pork Tenderloin  This great and versatile white meat is 31% leaner than it was 20 years ago.  Soy  Fifty grams of soy protein daily can help lower cholesterol by about 3%. Eating soy protein instead of sources of higher-fat protein -- and maintaining a healthy diet -- can be good for your heart.  Lean  Beef  Lean beef has only one more gram of saturated fat than a skinless chicken breast. Lean beef is also an excellent source of zinc, iron, and vitamin B12.  Protein on the Go  If you don't have time to sit down for a meal, grab a meal replacement drink, cereal bar, or energy bar. Check the label to be sure the product contains at least six grams of protein and is low in sugar and fat.  Protein at [employer]fast  Research shows that including a source of protein like an egg or [place] yogurt at breakfast along with a high-fiber grain like whole wheat toast can help you feel full longer and eat less throughout the day.

## 2016-06-21 ENCOUNTER — Other Ambulatory Visit: Payer: Self-pay | Admitting: Family Medicine

## 2016-06-22 ENCOUNTER — Telehealth: Payer: Self-pay

## 2016-06-22 NOTE — Telephone Encounter (Signed)
Per Dr. Raliegh Scarlet, pt was informed that refill requests for her medications that have been prescribed by specialist (ie pulmonology, cardiology, GI) needs to be refilled by those providers.  Pt expressed understanding and is agreeable.  Charyl Bigger, CMA

## 2016-06-23 NOTE — Assessment & Plan Note (Signed)
Explained to patient what BMI refers to, and what it means medically.    Told patient to think about it as a "medical risk stratification measurement" and how increasing BMI is associated with increasing risk/ or worsening state of various diseases such as hypertension, hyperlipidemia, diabetes, OA, depression, OSA etc.  American Heart Association guidelines for healthy diet, basically Mediterranean diet, and exercise guidelines of 30 minutes 5 days per week or more, discussed with pt.  1-2 lb wt loss per week recommended.    Declines nutrition referral 

## 2016-06-23 NOTE — Assessment & Plan Note (Signed)
Cont Mgt plan per Dr Talmage NapBalan  - Continue to closely monitor blood sugars and try to walk 10-20 minutes daily and eat a prudent diet.   - Counseled patient on pathophysiology of disease and discussed various treatment options, which often includes dietary and lifestyle modifications as first line.  Importance of low carb/ketogenic diet discussed with patient in addition to regular exercise.   - Check FBS and 2 hours after the biggest meal of your day.  Keep log and bring in next OV for my review.   Also, if you ever feel poorly, please check your blood pressure and blood sugar, as one or the other could be the cause of your symptoms.  - Being a diabetic, reminded pt need yearly eye and foot exams. Make appt.for diabetic eye exam

## 2016-06-23 NOTE — Assessment & Plan Note (Signed)
Pt will speak to her GI doc Lynann Bologna( Rajesh Gupta MD) about this.   Results of labs sent to him as well.    -D/c pt that it appears to be hepatic in nature, not from bone

## 2016-06-23 NOTE — Assessment & Plan Note (Signed)
Sx stable, pt sees Dr. Bonney Aidamaswamey

## 2016-06-23 NOTE — Assessment & Plan Note (Signed)
Cholesterol levels - best they've looked in years.  Continue current regimen.  Encouraged low saturated and trans fats

## 2016-06-23 NOTE — Assessment & Plan Note (Signed)
Carotid US 11/16: Results: "Stable heterogeneous plaque, bilaterally. Stable 1-39% bilateral ICA stenosis. Normal subclavian arteries, bilaterally. Patent vertebral arteries with antegrade flow. f/u 2 years."

## 2016-06-23 NOTE — Assessment & Plan Note (Signed)
Levels are stable, continue current medications.

## 2016-06-23 NOTE — Assessment & Plan Note (Signed)
- 

## 2016-06-23 NOTE — Assessment & Plan Note (Signed)
Heart attack in past with stent placement. Very stable for many years. Sees Dr.Nahser

## 2016-06-23 NOTE — Assessment & Plan Note (Signed)
Txmnt per Cardiology.  Encouraged pt to make f/up appt wih them.    I rec once yrly-  Unless they advise differrently

## 2016-06-28 ENCOUNTER — Other Ambulatory Visit: Payer: Self-pay

## 2016-06-28 DIAGNOSIS — H04123 Dry eye syndrome of bilateral lacrimal glands: Secondary | ICD-10-CM | POA: Diagnosis not present

## 2016-06-28 DIAGNOSIS — Z780 Asymptomatic menopausal state: Secondary | ICD-10-CM

## 2016-06-28 DIAGNOSIS — Z9189 Other specified personal risk factors, not elsewhere classified: Secondary | ICD-10-CM

## 2016-06-28 DIAGNOSIS — Z961 Presence of intraocular lens: Secondary | ICD-10-CM | POA: Diagnosis not present

## 2016-06-28 DIAGNOSIS — H40013 Open angle with borderline findings, low risk, bilateral: Secondary | ICD-10-CM | POA: Diagnosis not present

## 2016-06-28 DIAGNOSIS — H11423 Conjunctival edema, bilateral: Secondary | ICD-10-CM | POA: Diagnosis not present

## 2016-06-28 DIAGNOSIS — H16143 Punctate keratitis, bilateral: Secondary | ICD-10-CM | POA: Diagnosis not present

## 2016-06-28 DIAGNOSIS — T1500XA Foreign body in cornea, unspecified eye, initial encounter: Secondary | ICD-10-CM | POA: Diagnosis not present

## 2016-06-28 DIAGNOSIS — H3589 Other specified retinal disorders: Secondary | ICD-10-CM | POA: Diagnosis not present

## 2016-06-28 DIAGNOSIS — Z9849 Cataract extraction status, unspecified eye: Secondary | ICD-10-CM | POA: Diagnosis not present

## 2016-06-28 DIAGNOSIS — H18413 Arcus senilis, bilateral: Secondary | ICD-10-CM | POA: Diagnosis not present

## 2016-06-28 DIAGNOSIS — S0500XA Injury of conjunctiva and corneal abrasion without foreign body, unspecified eye, initial encounter: Secondary | ICD-10-CM | POA: Diagnosis not present

## 2016-06-28 DIAGNOSIS — H11153 Pinguecula, bilateral: Secondary | ICD-10-CM | POA: Diagnosis not present

## 2016-07-04 ENCOUNTER — Encounter: Payer: Self-pay | Admitting: Family Medicine

## 2016-07-04 ENCOUNTER — Ambulatory Visit (INDEPENDENT_AMBULATORY_CARE_PROVIDER_SITE_OTHER): Payer: PPO | Admitting: Family Medicine

## 2016-07-04 VITALS — BP 88/57 | HR 79 | Temp 98.4°F | Ht 60.0 in | Wt 187.9 lb

## 2016-07-04 DIAGNOSIS — R509 Fever, unspecified: Secondary | ICD-10-CM

## 2016-07-04 DIAGNOSIS — R52 Pain, unspecified: Secondary | ICD-10-CM | POA: Diagnosis not present

## 2016-07-04 DIAGNOSIS — R05 Cough: Secondary | ICD-10-CM | POA: Diagnosis not present

## 2016-07-04 DIAGNOSIS — J019 Acute sinusitis, unspecified: Secondary | ICD-10-CM | POA: Diagnosis not present

## 2016-07-04 DIAGNOSIS — J029 Acute pharyngitis, unspecified: Secondary | ICD-10-CM | POA: Diagnosis not present

## 2016-07-04 DIAGNOSIS — R059 Cough, unspecified: Secondary | ICD-10-CM

## 2016-07-04 LAB — POCT RAPID STREP A (OFFICE): Rapid Strep A Screen: NEGATIVE

## 2016-07-04 MED ORDER — CLINDAMYCIN HCL 300 MG PO CAPS: 300.0000 mg | ORAL_CAPSULE | Freq: Three times a day (TID) | ORAL | 0 refills | Status: DC

## 2016-07-04 MED ORDER — HYDROCOD POLST-CPM POLST ER 10-8 MG/5ML PO SUER: 5.0000 mL | Freq: Two times a day (BID) | ORAL | 0 refills | Status: DC | PRN

## 2016-07-04 NOTE — Progress Notes (Addendum)
Assessment and plan:  1. Acute rhinosinusitis   2. Cough   3. Fever chills   4. Body aches   5. Acute pharyngitis, unspecified etiology     1.  Negative Strep test.  Pt reasssurred.  - Start the Tussionex for your cough and Tylenol severe cold and flu/ sinus medicine or DayQuil\NyQuil or the like but, if you're cold lasts more than 5-7 days and is getting worse or if you're sinus congestion localizes to one side of your face or ear etc., then start the clindamycin I gave you- otherwise don't take if you continue to improve slowly.  Anticipatory guidance and routine counseling done re: condition, txmnt options and need for follow up. All questions of patient's were answered. - Viral vs Allergic vs Bacterial causes for pt's symptoms reveiwed.    - Supportive care and various OTC medications discussed in addition to any prescribed. - Call or RTC if new symptoms, or if no improvement or worse over next couple days.     New Prescriptions   CHLORPHENIRAMINE-HYDROCODONE (TUSSIONEX) 10-8 MG/5ML SUER    Take 5 mLs by mouth every 12 (twelve) hours as needed for cough (cough, will cause drowsiness.).   CLINDAMYCIN (CLEOCIN) 300 MG CAPSULE    Take 1 capsule (300 mg total) by mouth 3 (three) times daily.    Modified Medications   Modified Medication Previous Medication   LANTUS SOLOSTAR 100 UNIT/ML SOLOSTAR PEN Insulin Glargine (LANTUS SOLOSTAR) 100 UNIT/ML Solostar Pen      INJECT 64 UNITS SUBCUTANEOUSLY EVERY NIGHT AT BEDTIME AND 30 UNITS 12 HOURS LATER    Patient is 64 units at bedtime and 30 units 12 hrs later. 30 d supply Please include QS 3 months.   NOVOLOG FLEXPEN 100 UNIT/ML FLEXPEN NOVOLOG FLEXPEN 100 UNIT/ML FlexPen      INJECT 20 UNITS SUBCUTANEOUSLY THREE TIMES A DAY BEFORE MEALS; ADJUST AND INCREASE ACCORDINGLY    INJECT 20 UNITS SUBCUTANEOUSLY THREE TIMES A DAY BEFORE MEALS; ADJUST AND INCREASE ACCORDINGLY    Discontinued Medications   ESCITALOPRAM (LEXAPRO) 10  MG TABLET    Take 1 tablet by mouth daily.     Gross side effects, risk and benefits, and alternatives of medications discussed with patient.  Patient is aware that all medications have potential side effects and we are unable to predict every sideeffect or drug-drug interaction that may occur.  Expresses verbal understanding and consents to current therapy plan and treatment regiment.  Return if symptoms worsen or fail to improve.  Please see AVS handed out to patient at the end of our visit for additional patient instructions/ counseling done pertaining to today's office visit.  Note: This document was prepared using Dragon voice recognition software and may include unintentional dictation errors.    Subjective:    Chief Complaint  Patient presents with  . Sore Throat  . Cough    HPI:  Pt presents with URI sx for >er 2 days.      C/o F/C, body aches, ST, cough, felt weak, and B/L ears were aching, + head congestion was bad and hurting B/L frontotemporal region. + cough- dry mostly- during day but W at night.  Bad body aches yest and day prior- not today.  Worried it could be strep from one of the grand babies.    No N/V/D, No SOB/DIB, No Rash.     tylenol- taken anything for sx.   She took an old cough syrup  she had prior from prior prescription - it was Tussionex and she says that helps her more than anything else.  Overall getting little better.     Patient Active Problem List   Diagnosis Date Noted  . Obesity, Class I, BMI 30-34.9 06/23/2016    Priority: High  . Depression 03/13/2016    Priority: High  . Hypothyroidism 03/13/2016    Priority: High  . Insulin dependent diabetes mellitus with complications (HCC) 03/13/2016    Priority: High  . Mixed hyperlipidemia 03/13/2016    Priority: High  . Coronary artery disease 04/11/2011    Priority: Medium  . COPD (chronic obstructive pulmonary disease) (HCC) 04/11/2011    Priority: Medium  . Obstructive sleep apnea  04/11/2011    Priority: Medium  . Heart failure, diastolic, chronic (HCC) 03/29/2008    Priority: Medium  . Vitamin D deficiency 05/11/2016    Priority: Low  . Atherosclerosis of carotid arteries 04/03/2006    Priority: Low  . Chronic leukopenia 05/11/2016  . Elevated serum alkaline phosphatase level- hepatocellular in origin 05/11/2016  . Constipation, chronic 03/13/2016  . At high risk for osteoporosis 03/13/2016  . Hypotension, unspecified 12/13/2012  . Hypokalemia 12/13/2012  . Thrombocytopenia, unspecified (HCC) 12/13/2012  . h/o Clostridium difficile colitis 12/12/2012  . History of GI bleed 12/12/2012  . ILD (interstitial lung disease) (HCC) 11/25/2012    Past medical history, Surgical history, Family history reviewed and noted below, Social history, Allergies, and Medications have been entered into the medical record, reviewed and changed as needed.   Allergies  Allergen Reactions  . Ace Inhibitors Cough  . Benadryl [Diphenhydramine Hcl (Sleep)] Other (See Comments)    hypotension  . Penicillins Hives and Swelling    Swelling of arms Has patient had a PCN reaction causing immediate rash, facial/tongue/throat swelling, SOB or lightheadedness with hypotension: unknown Has patient had a PCN reaction causing severe rash involving mucus membranes or skin necrosis: no Has patient had a PCN reaction that required hospitalization: unknown Has patient had a PCN reaction occurring within the last 10 years: no, childhood allergy If all of the above answers are "NO", then may proceed with Cephalosporin use.   . Sulfonamide Derivatives Swelling    Review of Systems  Constitutional: Positive for chills, fever and malaise/fatigue. Negative for diaphoresis and weight loss.  HENT: Positive for congestion, ear pain, sinus pain and sore throat. Negative for tinnitus.   Eyes: Positive for pain. Negative for blurred vision, double vision and photophobia.  Respiratory: Positive for cough.  Negative for wheezing.   Cardiovascular: Negative.  Negative for chest pain and palpitations.  Gastrointestinal: Negative.  Negative for blood in stool, diarrhea, nausea and vomiting.  Genitourinary: Negative.  Negative for dysuria, frequency and urgency.  Musculoskeletal: Negative.  Negative for joint pain and myalgias.  Skin: Negative.  Negative for itching and rash.  Neurological: Positive for weakness and headaches. Negative for dizziness and focal weakness.  Endo/Heme/Allergies: Negative.  Negative for environmental allergies and polydipsia. Does not bruise/bleed easily.  Psychiatric/Behavioral: Negative.  Negative for depression and memory loss. The patient is not nervous/anxious and does not have insomnia.     Objective:   Blood pressure (!) 88/57, pulse 79, temperature 98.4 F (36.9 C), temperature source Oral, height 5' (1.524 m), weight 187 lb 14.4 oz (85.2 kg). Body mass index is 36.7 kg/m. General: Well Developed, well nourished, appropriate for stated age.  Neuro: Alert and oriented x3, extra-ocular muscles intact, sensation grossly intact.  HEENT: Normocephalic, atraumatic, pupils  equal round reactive to light, neck supple, no masses, no painful lymphadenopathy, TM's intact B/L, no acute findings. Nares- patent, clear d/c, OP- clear, mild erythema, No TTP sinuses Skin: Warm and dry, no gross rash. Cardiac: RRR, S1 S2,  no murmurs rubs or gallops.  Respiratory: ECTA B/L and A/P, Not using accessory muscles, speaking in full sentences- unlabored. Vascular:  No gross lower ext edema, cap RF less 2 sec. Psych: No HI/SI, judgement and insight good, Euthymic mood. Full Affect.   Patient Care Team    Relationship Specialty Notifications Start End  Thomasene Loteborah Sanita Estrada, DO PCP - General Family Medicine  03/12/16   Kalman ShanMurali Ramaswamy, MD Attending Physician Pulmonary Disease Abnormal results only, Admissions 12/07/12   Marcellus Scottanvir Chodri, MD Attending Physician Pulmonary Disease  05/01/16    Vesta MixerPhilip J Nahser, MD Consulting Physician Cardiology  05/01/16   Lynann Bolognaajesh Gupta, MD Referring Physician Gastroenterology  05/01/16

## 2016-07-04 NOTE — Patient Instructions (Addendum)
Start the Tussionex for your cough and  Tylenol severe cold and flu/ sinus medicine or DayQuil\NyQuil or the like but, if you're cold lasts more than 5-7 days and is getting worse or if you're sinus congestion localizes to one side of your face or ear etc., then start the clindamycin I gave you- otherwise don't take if you continue to improve slowly.    Sinusitis, Adult Sinusitis is soreness and inflammation of your sinuses. Sinuses are hollow spaces in the bones around your face. Your sinuses are located:  Around your eyes.  In the middle of your forehead.  Behind your nose.  In your cheekbones. Your sinuses and nasal passages are lined with a stringy fluid (mucus). Mucus normally drains out of your sinuses. When your nasal tissues become inflamed or swollen, the mucus can become trapped or blocked so air cannot flow through your sinuses. This allows bacteria, viruses, and funguses to grow, which leads to infection. Sinusitis can develop quickly and last for 7?10 days (acute) or for more than 12 weeks (chronic). Sinusitis often develops after a cold. What are the causes? This condition is caused by anything that creates swelling in the sinuses or stops mucus from draining, including:  Allergies.  Asthma.  Bacterial or viral infection.  Abnormally shaped bones between the nasal passages.  Nasal growths that contain mucus (nasal polyps).  Narrow sinus openings.  Pollutants, such as chemicals or irritants in the air.  A foreign object stuck in the nose.  A fungal infection. This is rare. What increases the risk? The following factors may make you more likely to develop this condition:  Having allergies or asthma.  Having had a recent cold or respiratory tract infection.  Having structural deformities or blockages in your nose or sinuses.  Having a weak immune system.  Doing a lot of swimming or diving.  Overusing nasal sprays.  Smoking. What are the signs or  symptoms? The main symptoms of this condition are pain and a feeling of pressure around the affected sinuses. Other symptoms include:  Upper toothache.  Earache.  Headache.  Bad breath.  Decreased sense of smell and taste.  A cough that may get worse at night.  Fatigue.  Fever.  Thick drainage from your nose. The drainage is often green and it may contain pus (purulent).  Stuffy nose or congestion.  Postnasal drip. This is when extra mucus collects in the throat or back of the nose.  Swelling and warmth over the affected sinuses.  Sore throat.  Sensitivity to light. How is this diagnosed? This condition is diagnosed based on symptoms, a medical history, and a physical exam. To find out if your condition is acute or chronic, your health care provider may:  Look in your nose for signs of nasal polyps.  Tap over the affected sinus to check for signs of infection.  View the inside of your sinuses using an imaging device that has a light attached (endoscope). If your health care provider suspects that you have chronic sinusitis, you may also:  Be tested for allergies.  Have a sample of mucus taken from your nose (nasal culture) and checked for bacteria.  Have a mucus sample examined to see if your sinusitis is related to an allergy. If your sinusitis does not respond to treatment and it lasts longer than 8 weeks, you may have an MRI or CT scan to check your sinuses. These scans also help to determine how severe your infection is. In rare cases, a  bone biopsy may be done to rule out more serious types of fungal sinus disease. How is this treated? Treatment for sinusitis depends on the cause and whether your condition is chronic or acute. If a virus is causing your sinusitis, your symptoms will go away on their own within 10 days. You may be given medicines to relieve your symptoms, including:  Topical nasal decongestants. They shrink swollen nasal passages and let mucus  drain from your sinuses.  Antihistamines. These drugs block inflammation that is triggered by allergies. This can help to ease swelling in your nose and sinuses.  Topical nasal corticosteroids. These are nasal sprays that ease inflammation and swelling in your nose and sinuses.  Nasal saline washes. These rinses can help to get rid of thick mucus in your nose. If your condition is caused by bacteria, you will be given an antibiotic medicine. If your condition is caused by a fungus, you will be given an antifungal medicine. Surgery may be needed to correct underlying conditions, such as narrow nasal passages. Surgery may also be needed to remove polyps. Follow these instructions at home: Medicines  Take, use, or apply over-the-counter and prescription medicines only as told by your health care provider. These may include nasal sprays.  If you were prescribed an antibiotic medicine, take it as told by your health care provider. Do not stop taking the antibiotic even if you start to feel better. Hydrate and Humidify  Drink enough water to keep your urine clear or pale yellow. Staying hydrated will help to thin your mucus.  Use a cool mist humidifier to keep the humidity level in your home above 50%.  Inhale steam for 10-15 minutes, 3-4 times a day or as told by your health care provider. You can do this in the bathroom while a hot shower is running.  Limit your exposure to cool or dry air. Rest  Rest as much as possible.  Sleep with your head raised (elevated).  Make sure to get enough sleep each night. General instructions  Apply a warm, moist washcloth to your face 3-4 times a day or as told by your health care provider. This will help with discomfort.  Wash your hands often with soap and water to reduce your exposure to viruses and other germs. If soap and water are not available, use hand sanitizer.  Do not smoke. Avoid being around people who are smoking (secondhand  smoke).  Keep all follow-up visits as told by your health care provider. This is important. Contact a health care provider if:  You have a fever.  Your symptoms get worse.  Your symptoms do not improve within 10 days. Get help right away if:  You have a severe headache.  You have persistent vomiting.  You have pain or swelling around your face or eyes.  You have vision problems.  You develop confusion.  Your neck is stiff.  You have trouble breathing. This information is not intended to replace advice given to you by your health care provider. Make sure you discuss any questions you have with your health care provider. Document Released: 08/06/2005 Document Revised: 04/01/2016 Document Reviewed: 06/01/2015 Elsevier Interactive Patient Education  2017 [employer]      Upper Respiratory Infection, Adult Most upper respiratory infections (URIs) are a viral infection of the air passages leading to the lungs. A URI affects the nose, throat, and upper air passages. The most common type of URI is nasopharyngitis and is typically referred to as "the common  cold." URIs run their course and usually go away on their own. Most of the time, a URI does not require medical attention, but sometimes a bacterial infection in the upper airways can follow a viral infection. This is called a secondary infection. Sinus and middle ear infections are common types of secondary upper respiratory infections. Bacterial pneumonia can also complicate a URI. A URI can worsen asthma and chronic obstructive pulmonary disease (COPD). Sometimes, these complications can require emergency medical care and may be life threatening. What are the causes? Almost all URIs are caused by viruses. A virus is a type of germ and can spread from one person to another. What increases the risk? You may be at risk for a URI if:  You smoke.  You have chronic heart or lung disease.  You have a weakened defense (immune)  system.  You are very young or very old.  You have nasal allergies or asthma.  You work in crowded or poorly ventilated areas.  You work in health care facilities or schools. What are the signs or symptoms? Symptoms typically develop 2-3 days after you come in contact with a cold virus. Most viral URIs last 7-10 days. However, viral URIs from the influenza virus (flu virus) can last 14-18 days and are typically more severe. Symptoms may include:  Runny or stuffy (congested) nose.  Sneezing.  Cough.  Sore throat.  Headache.  Fatigue.  Fever.  Loss of appetite.  Pain in your forehead, behind your eyes, and over your cheekbones (sinus pain).  Muscle aches. How is this diagnosed? Your health care provider may diagnose a URI by:  Physical exam.  Tests to check that your symptoms are not due to another condition such as:  Strep throat.  Sinusitis.  Pneumonia.  Asthma. How is this treated? A URI goes away on its own with time. It cannot be cured with medicines, but medicines may be prescribed or recommended to relieve symptoms. Medicines may help:  Reduce your fever.  Reduce your cough.  Relieve nasal congestion. Follow these instructions at home:  Take medicines only as directed by your health care provider.  Gargle warm saltwater or take cough drops to comfort your throat as directed by your health care provider.  Use a warm mist humidifier or inhale steam from a shower to increase air moisture. This may make it easier to breathe.  Drink enough fluid to keep your urine clear or pale yellow.  Eat soups and other clear broths and maintain good nutrition.  Rest as needed.  Return to work when your temperature has returned to normal or as your health care provider advises. You may need to stay home longer to avoid infecting others. You can also use a face mask and careful hand washing to prevent spread of the virus.  Increase the usage of your inhaler if  you have asthma.  Do not use any tobacco products, including cigarettes, chewing tobacco, or electronic cigarettes. If you need help quitting, ask your health care provider. How is this prevented? The best way to protect yourself from getting a cold is to practice good hygiene.  Avoid oral or hand contact with people with cold symptoms.  Wash your hands often if contact occurs. There is no clear evidence that vitamin C, vitamin E, echinacea, or exercise reduces the chance of developing a cold. However, it is always recommended to get plenty of rest, exercise, and practice good nutrition. Contact a health care provider if:  You are getting  worse rather than better.  Your symptoms are not controlled by medicine.  You have chills.  You have worsening shortness of breath.  You have brown or red mucus.  You have yellow or brown nasal discharge.  You have pain in your face, especially when you bend forward.  You have a fever.  You have swollen neck glands.  You have pain while swallowing.  You have white areas in the back of your throat. Get help right away if:  You have severe or persistent:  Headache.  Ear pain.  Sinus pain.  Chest pain.  You have chronic lung disease and any of the following:  Wheezing.  Prolonged cough.  Coughing up blood.  A change in your usual mucus.  You have a stiff neck.  You have changes in your:  Vision.  Hearing.  Thinking.  Mood. This information is not intended to replace advice given to you by your health care provider. Make sure you discuss any questions you have with your health care provider. Document Released: 01/30/2001 Document Revised: 04/08/2016 Document Reviewed: 11/11/2013 Elsevier Interactive Patient Education  2017 [employer].

## 2016-07-05 ENCOUNTER — Other Ambulatory Visit: Payer: Self-pay | Admitting: Family Medicine

## 2016-07-05 DIAGNOSIS — Z1231 Encounter for screening mammogram for malignant neoplasm of breast: Secondary | ICD-10-CM

## 2016-07-06 ENCOUNTER — Other Ambulatory Visit: Payer: PPO

## 2016-07-11 ENCOUNTER — Ambulatory Visit: Payer: PPO | Admitting: Cardiovascular Disease

## 2016-07-16 ENCOUNTER — Other Ambulatory Visit: Payer: Self-pay | Admitting: Family Medicine

## 2016-07-17 ENCOUNTER — Other Ambulatory Visit: Payer: Self-pay

## 2016-07-17 ENCOUNTER — Encounter: Payer: Self-pay | Admitting: Cardiovascular Disease

## 2016-07-17 ENCOUNTER — Ambulatory Visit (INDEPENDENT_AMBULATORY_CARE_PROVIDER_SITE_OTHER): Payer: PPO | Admitting: Cardiovascular Disease

## 2016-07-17 VITALS — BP 120/52 | HR 74 | Ht 60.0 in | Wt 185.8 lb

## 2016-07-17 DIAGNOSIS — E782 Mixed hyperlipidemia: Secondary | ICD-10-CM | POA: Diagnosis not present

## 2016-07-17 DIAGNOSIS — I2510 Atherosclerotic heart disease of native coronary artery without angina pectoris: Secondary | ICD-10-CM

## 2016-07-17 DIAGNOSIS — I2583 Coronary atherosclerosis due to lipid rich plaque: Secondary | ICD-10-CM

## 2016-07-17 NOTE — Progress Notes (Signed)
Cardiology Office Note   Date:  07/17/2016   ID:  KORINE WINTON, DOB 1934-03-21, MRN [id number]  PCP:  Mellody Dance, DO  Cardiologist:   Mertie Moores, MD   Chief Complaint  Patient presents with  . Coronary Artery Disease   Problem List  1. Coronary artery disease-status post inferior wall myocardial infarction 2. Diabetes mellitus 3. Obesity 4. Sleep apnea 5. Hypothyroidism 6 hyper lipidemia     Jeraline A Margulies is a 82 y.o. female who presents for follow up of her CAD and CHF. She is s/p stenting many years ago .    Her GI doctor would like for her to stop her ASA because of low platelet count   She has continued to have slow GI bleeds     07/07/2015 Doing well from a cardiac standpoint.  BP has been low  We stopped ASA at her last visit   Nov. 28, 2017  Aarionna is seen back today  No CP  She is off the ASA.   No further GI bleeding    Past Medical History:  Diagnosis Date  . Cirrhosis, non-alcoholic ([place])   . Coronary artery disease    status post inferior wall myocardial infarction  . Diabetes mellitus   . Dyslipidemia   . Hypotension 08/02/2010   recent episodes of orthostatic hypotension  . Hypothyroidism   . Leg edema   . Myocardial infarct, old   . Neuropathy of hand    left hand numbness/neuropathy  . Obesity   . Sleep apnea     Past Surgical History:  Procedure Laterality Date  . ABDOMINAL HYSTERECTOMY    . CARDIAC CATHETERIZATION     The ejection fraction is around 50%  . CHOLECYSTECTOMY    . CORONARY STENT PLACEMENT    . ESOPHAGOGASTRODUODENOSCOPY N/A 12/12/2012   Procedure: ESOPHAGOGASTRODUODENOSCOPY (EGD);  Surgeon: [name], MD;  Location: [name];  Service: Endoscopy;  Laterality: N/A;  . FLEXIBLE SIGMOIDOSCOPY N/A 12/12/2012   Procedure: FLEXIBLE SIGMOIDOSCOPY;  Surgeon: [name], MD;  Location: [facility];  Service: Endoscopy;  Laterality: N/A;  . TUBAL LIGATION    . WRIST SURGERY       Current Outpatient  Prescriptions  Medication Sig Dispense Refill  . ADVAIR DISKUS 250-50 MCG/DOSE AEPB INHALE ONE PUFF TWICE DAILY 60 each 0  . atorvastatin (LIPITOR) 20 MG tablet Take 20 mg by mouth daily.     . carvedilol (COREG) 3.125 MG tablet TAKE ONE TABLET BY MOUTH TWO TIMES DAILY WITH A MEAL. 180 tablet 0  . chlorpheniramine-HYDROcodone (TUSSIONEX) 10-8 MG/5ML SUER Take 5 mLs by mouth every 12 (twelve) hours as needed for cough (cough, will cause drowsiness.). 200 mL 0  . Cholecalciferol (VITAMIN D3) 5000 units TABS 5,000 IU OTC vitamin D3 daily. 90 tablet 3  . clindamycin (CLEOCIN) 300 MG capsule Take 1 capsule (300 mg total) by mouth 3 (three) times daily. 30 capsule 0  . cycloSPORINE (RESTASIS) 0.05 % ophthalmic emulsion Place 1 drop into both eyes 2 (two) times daily.    Marland Kitchen escitalopram (LEXAPRO) 20 MG tablet Take 20 mg by mouth daily.    . furosemide (LASIX) 40 MG tablet Take 1/2 tablet daily (Patient taking differently: Take 20 mg by mouth daily. Take 1/2 tablet daily) 30 tablet 6  . LANTUS SOLOSTAR 100 UNIT/ML Solostar Pen INJECT 64 UNITS SUBCUTANEOUSLY EVERY NIGHT AT BEDTIME AND 30 UNITS 12 HOURS LATER 8 pen 2  . levothyroxine (SYNTHROID, LEVOTHROID) 112 MCG tablet Take  112 mcg by mouth daily before breakfast.    . losartan (COZAAR) 25 MG tablet TAKE 1 TABLET BY MOUTH EVERY DAY 30 tablet 11  . montelukast (SINGULAIR) 10 MG tablet Take 10 mg by mouth daily.    . nitroGLYCERIN (NITROSTAT) 0.4 MG SL tablet Place 1 tablet (0.4 mg total) under the tongue every 5 (five) minutes as needed. Chest pain (Patient taking differently: Place 0.4 mg under the tongue every 5 (five) minutes as needed for chest pain. Chest pain) 25 tablet 6  . NOVOLOG FLEXPEN 100 UNIT/ML FlexPen INJECT 20 UNITS SUBCUTANEOUSLY THREE TIMES A DAY BEFORE MEALS; ADJUST AND INCREASE ACCORDINGLY 6 pen 2  . ONE TOUCH ULTRA TEST test strip TEST BLOOD SUGAR 2-3 TIMES A DAY 100 each 11  . pantoprazole (PROTONIX) 40 MG tablet Take 1 tablet (40 mg  total) by mouth 2 (two) times daily before a meal. 62 tablet 0  . potassium chloride (KLOR-CON) 10 MEQ CR tablet Take 10 mEq by mouth 2 (two) times daily.     Marland Kitchen PROAIR HFA 108 (90 Base) MCG/ACT inhaler Inhale 2 puffs into the lungs every 6 (six) hours as needed.    . traMADol (ULTRAM) 50 MG tablet Take 1 tablet by mouth every 6 (six) hours as needed.    . Vitamin D, Ergocalciferol, (DRISDOL) 50000 units CAPS capsule Take 1 capsule (50,000 Units total) by mouth every 7 (seven) days. 12 capsule 20  . White Petrolatum-Mineral Oil (SYSTANE NIGHTTIME) OINT Place 1 application into both eyes at bedtime as needed (irritation).      No current facility-administered medications for this visit.     Allergies:   Ace inhibitors; Benadryl [diphenhydramine hcl (sleep)]; Penicillins; and Sulfonamide derivatives    Social History:  The patient  reports that she quit smoking about 42 years ago. She has never used smokeless tobacco. She reports that she does not drink alcohol or use drugs.   Family History:  The patient's family history includes Alcohol abuse in her maternal uncle and son; Cancer in her son; Diabetes in her brother, daughter, daughter, father, mother, sister, son, son, and son; Emphysema in her brother; Heart attack in her daughter, father, and paternal grandfather; Heart disease in her father; Hyperlipidemia in her brother, father, and sister; Hypertension in her brother, father, and sister; Suicidality in her son.    ROS:  Please see the history of present illness.    Review of Systems: Constitutional:  denies fever, chills, diaphoresis, appetite change and fatigue.  HEENT: denies photophobia, eye pain, redness, hearing loss, ear pain, congestion, sore throat, rhinorrhea, sneezing, neck pain, neck stiffness and tinnitus.  Respiratory: denies SOB, DOE, cough, chest tightness, and wheezing.  Cardiovascular: denies chest pain, palpitations and leg swelling.  Gastrointestinal: denies nausea,  vomiting, abdominal pain, diarrhea, constipation, blood in stool.  Genitourinary: denies dysuria, urgency, frequency, hematuria, flank pain and difficulty urinating.  Musculoskeletal: denies  myalgias, back pain, joint swelling, arthralgias and gait problem.   Skin: denies pallor, rash and wound.  Neurological: denies dizziness, seizures, syncope, weakness, light-headedness, numbness and headaches.   Hematological: denies adenopathy, easy bruising, personal or family bleeding history.  Psychiatric/ Behavioral: denies suicidal ideation, mood changes, confusion, nervousness, sleep disturbance and agitation.       All other systems are reviewed and negative.    PHYSICAL EXAM: VS:  BP (!) 120/52 (BP Location: Left Arm, Patient Position: Sitting, Cuff Size: Normal)   Pulse 74   Ht 5' (1.524 m)   Wt 185 lb  12.8 oz (84.3 kg)   BMI 36.29 kg/m  , BMI Body mass index is 36.29 kg/m. GEN: Well nourished, well developed, in no acute distress  HEENT: normal  Neck: no JVD, carotid bruits, or masses Cardiac: RRR; Occasional premature beat. no murmurs, rubs, or gallops,no edema  Respiratory:  clear to auscultation bilaterally, normal work of breathing GI: soft, nontender, nondistended, + BS MS: no deformity or atrophy  Skin: warm and dry, no rash Neuro:  Strength and sensation are intact Psych: normal   EKG:  EKG is ordered today. Normal sinus rhythm at 74 beats a minute. She has occasional premature atrial contractions.  previous inferior wall myocardial infarction.    Recent Labs: 03/20/2016: TSH 0.71 05/02/2016: ALT 21; BUN 9; Creat 0.82; Hemoglobin 13.2; Platelets 85; Potassium 4.4; Sodium 137    Lipid Panel    Component Value Date/Time   CHOL 136 05/02/2016 0758   TRIG 125 05/02/2016 0758   HDL 55 05/02/2016 0758   CHOLHDL 2.5 05/02/2016 0758   VLDL 25 05/02/2016 0758   LDLCALC 56 05/02/2016 0758      Wt Readings from Last 3 Encounters:  07/17/16 185 lb 12.8 oz (84.3 kg)    07/04/16 187 lb 14.4 oz (85.2 kg)  06/20/16 170 lb 12.8 oz (77.5 kg)      Other studies Reviewed: Additional studies/ records that were reviewed today include: . Review of the above records demonstrates:    ASSESSMENT AND PLAN:  1. Coronary artery disease-status post inferior wall myocardial infarction -Niomie is doing well - has no symptoms .   2. Diabetes mellitus -   3. Obesity - encouraged her to exercise regularly   4. Sleep apnea-   5. Hypothyroidism - followed by her medical doctor   6 hyperlipidemia - will continue current meds.    Current medicines are reviewed at length with the patient today.  The patient does not have concerns regarding medicines.  The following changes have been made:  no change   Disposition:   FU with me in  6 months     Signed, Mertie Moores, MD  07/17/2016 3:45 PM    [facility] [address], [place], [place]  [address] Phone: [phone]; Fax: [phone]

## 2016-07-17 NOTE — Patient Instructions (Signed)

## 2016-07-19 ENCOUNTER — Other Ambulatory Visit: Payer: Self-pay | Admitting: Family Medicine

## 2016-07-27 ENCOUNTER — Ambulatory Visit: Payer: PPO

## 2016-07-27 ENCOUNTER — Other Ambulatory Visit: Payer: PPO

## 2016-08-09 ENCOUNTER — Other Ambulatory Visit: Payer: Self-pay | Admitting: Family Medicine

## 2016-08-09 DIAGNOSIS — H11423 Conjunctival edema, bilateral: Secondary | ICD-10-CM | POA: Diagnosis not present

## 2016-08-09 DIAGNOSIS — E10319 Type 1 diabetes mellitus with unspecified diabetic retinopathy without macular edema: Secondary | ICD-10-CM | POA: Diagnosis not present

## 2016-08-09 DIAGNOSIS — H11153 Pinguecula, bilateral: Secondary | ICD-10-CM | POA: Diagnosis not present

## 2016-08-09 DIAGNOSIS — H35033 Hypertensive retinopathy, bilateral: Secondary | ICD-10-CM | POA: Diagnosis not present

## 2016-08-09 DIAGNOSIS — H10413 Chronic giant papillary conjunctivitis, bilateral: Secondary | ICD-10-CM | POA: Diagnosis not present

## 2016-08-09 DIAGNOSIS — E103593 Type 1 diabetes mellitus with proliferative diabetic retinopathy without macular edema, bilateral: Secondary | ICD-10-CM | POA: Diagnosis not present

## 2016-08-09 DIAGNOSIS — H18413 Arcus senilis, bilateral: Secondary | ICD-10-CM | POA: Diagnosis not present

## 2016-08-09 DIAGNOSIS — H16143 Punctate keratitis, bilateral: Secondary | ICD-10-CM | POA: Diagnosis not present

## 2016-08-09 DIAGNOSIS — H3589 Other specified retinal disorders: Secondary | ICD-10-CM | POA: Diagnosis not present

## 2016-08-09 DIAGNOSIS — H40003 Preglaucoma, unspecified, bilateral: Secondary | ICD-10-CM | POA: Diagnosis not present

## 2016-08-09 DIAGNOSIS — H04123 Dry eye syndrome of bilateral lacrimal glands: Secondary | ICD-10-CM | POA: Diagnosis not present

## 2016-08-09 DIAGNOSIS — H40023 Open angle with borderline findings, high risk, bilateral: Secondary | ICD-10-CM | POA: Diagnosis not present

## 2016-08-16 ENCOUNTER — Other Ambulatory Visit: Payer: Self-pay | Admitting: Family Medicine

## 2016-08-16 ENCOUNTER — Other Ambulatory Visit: Payer: Self-pay | Admitting: Cardiovascular Disease

## 2016-08-17 ENCOUNTER — Telehealth: Payer: Self-pay | Admitting: Family Medicine

## 2016-08-17 MED ORDER — ESCITALOPRAM OXALATE 20 MG PO TABS: 20.0000 mg | ORAL_TABLET | Freq: Every day | ORAL | 0 refills | Status: DC

## 2016-08-17 NOTE — Telephone Encounter (Signed)
Informed pt that she needs to request refills for carvedilol and furosemide from Dr. Acie Fredrickson.  Pt expressed understanding and is agreeable.  Charyl Bigger, CMA

## 2016-08-17 NOTE — Telephone Encounter (Signed)
Pt recently seen by her Cards Doc on 11/ 28/ 2017  at 3:45 PM  by  Kristeen MissPhilip Nahser, MD.  He reviewed he BP meds with her due to her recent low BP's.  She was told to f/up in 42mo with him.  Please explain this to pt that it is in her best interest to get her CV Meds from her CV specialist. - Dr Elease HashimotoNahser.    ---> ok to rf escitalopram #90, 1 RF

## 2016-08-17 NOTE — Telephone Encounter (Signed)
Patient is requesting levothyroxine and her lasix sent to [facility]. Patient states they are coming today and would like to get them added before they come. She said they can be called into this number [phone]

## 2016-08-17 NOTE — Addendum Note (Signed)
Addended by: Stan HeadNELSON, [place] S on: 08/17/2016 10:16 AM   Modules accepted: Orders

## 2016-08-21 ENCOUNTER — Other Ambulatory Visit: Payer: Self-pay | Admitting: *Deleted

## 2016-08-21 NOTE — Telephone Encounter (Signed)
I am routing to you for advise as we have not authorized refills on the furosemide since 01/13/14. Carvedilol dose was reduced by pcp on 03/13/16 from 12.5 mg to 6.25 mg and then further reduced to 3.125 mg on 05/11/16 also by pcp. Okay to refill? Thanks, MI

## 2016-08-22 ENCOUNTER — Other Ambulatory Visit: Payer: Self-pay | Admitting: Family Medicine

## 2016-08-22 DIAGNOSIS — E2839 Other primary ovarian failure: Secondary | ICD-10-CM

## 2016-08-22 MED ORDER — CARVEDILOL 3.125 MG PO TABS: ORAL_TABLET | ORAL | 3 refills | Status: DC

## 2016-08-22 MED ORDER — FUROSEMIDE 40 MG PO TABS: 20.0000 mg | ORAL_TABLET | Freq: Every day | ORAL | 3 refills | Status: DC

## 2016-08-22 NOTE — Telephone Encounter (Signed)
Ok to refill since she was just evaluated by Dr. Acie Fredrickson

## 2016-08-23 ENCOUNTER — Ambulatory Visit: Payer: PPO

## 2016-08-23 ENCOUNTER — Inpatient Hospital Stay: Admission: RE | Admit: 2016-08-23 | Payer: PPO | Source: Ambulatory Visit

## 2016-08-23 ENCOUNTER — Other Ambulatory Visit: Payer: PPO

## 2016-09-04 ENCOUNTER — Ambulatory Visit
Admission: RE | Admit: 2016-09-04 | Discharge: 2016-09-04 | Disposition: A | Discharge status: Home / Self Care | Payer: PPO | Source: Ambulatory Visit | Attending: Family Medicine | Admitting: Family Medicine

## 2016-09-04 DIAGNOSIS — E2839 Other primary ovarian failure: Secondary | ICD-10-CM

## 2016-09-04 DIAGNOSIS — Z1231 Encounter for screening mammogram for malignant neoplasm of breast: Secondary | ICD-10-CM

## 2016-09-04 DIAGNOSIS — Z13820 Encounter for screening for osteoporosis: Secondary | ICD-10-CM | POA: Diagnosis not present

## 2016-09-04 DIAGNOSIS — Z780 Asymptomatic menopausal state: Secondary | ICD-10-CM | POA: Diagnosis not present

## 2016-09-13 ENCOUNTER — Other Ambulatory Visit: Payer: Self-pay | Admitting: Family Medicine

## 2016-10-04 ENCOUNTER — Other Ambulatory Visit: Payer: Self-pay | Admitting: Family Medicine

## 2016-10-05 NOTE — Telephone Encounter (Signed)
LVM advising pt that she must have an OV before any further refills of her medications.  Charyl Bigger, CMA

## 2016-10-11 ENCOUNTER — Other Ambulatory Visit: Payer: Self-pay | Admitting: Family Medicine

## 2016-10-11 ENCOUNTER — Other Ambulatory Visit: Payer: Self-pay | Admitting: Adult Health

## 2016-10-29 ENCOUNTER — Ambulatory Visit (INDEPENDENT_AMBULATORY_CARE_PROVIDER_SITE_OTHER): Payer: PPO | Admitting: Ophthalmology

## 2016-10-30 ENCOUNTER — Ambulatory Visit: Payer: PPO | Admitting: Adult Health

## 2016-11-02 ENCOUNTER — Other Ambulatory Visit: Payer: Self-pay | Admitting: Adult Health

## 2016-11-05 ENCOUNTER — Other Ambulatory Visit: Payer: Self-pay | Admitting: Adult Health

## 2016-11-08 ENCOUNTER — Ambulatory Visit (INDEPENDENT_AMBULATORY_CARE_PROVIDER_SITE_OTHER): Payer: PPO | Admitting: Adult Health

## 2016-11-08 ENCOUNTER — Other Ambulatory Visit: Payer: Self-pay | Admitting: Adult Health

## 2016-11-08 ENCOUNTER — Encounter: Payer: Self-pay | Admitting: Adult Health

## 2016-11-08 VITALS — BP 110/55 | HR 66 | Ht 60.0 in | Wt 184.2 lb

## 2016-11-08 DIAGNOSIS — I959 Hypotension, unspecified: Secondary | ICD-10-CM

## 2016-11-08 DIAGNOSIS — E118 Type 2 diabetes mellitus with unspecified complications: Secondary | ICD-10-CM

## 2016-11-08 DIAGNOSIS — Z794 Long term (current) use of insulin: Secondary | ICD-10-CM

## 2016-11-08 DIAGNOSIS — IMO0001 Reserved for inherently not codable concepts without codable children: Secondary | ICD-10-CM

## 2016-11-08 DIAGNOSIS — E782 Mixed hyperlipidemia: Secondary | ICD-10-CM | POA: Diagnosis not present

## 2016-11-08 LAB — POCT GLYCOSYLATED HEMOGLOBIN (HGB A1C): Hemoglobin A1C: 11

## 2016-11-08 MED ORDER — ATORVASTATIN CALCIUM 20 MG PO TABS: 20.0000 mg | ORAL_TABLET | Freq: Every day | ORAL | 2 refills | Status: DC

## 2016-11-08 MED ORDER — INSULIN GLARGINE 100 UNIT/ML SOLOSTAR PEN: PEN_INJECTOR | SUBCUTANEOUS | 2 refills | Status: DC

## 2016-11-08 MED ORDER — VITAMIN D (ERGOCALCIFEROL) 1.25 MG (50000 UNIT) PO CAPS
50000.0000 [IU] | ORAL_CAPSULE | ORAL | 2 refills | Status: DC
Start: 2016-11-08 — End: 2016-12-31

## 2016-11-08 MED ORDER — ESCITALOPRAM OXALATE 20 MG PO TABS: 20.0000 mg | ORAL_TABLET | Freq: Every day | ORAL | 0 refills | Status: DC

## 2016-11-08 MED ORDER — INSULIN ASPART 100 UNIT/ML FLEXPEN: PEN_INJECTOR | SUBCUTANEOUS | 2 refills | Status: DC

## 2016-11-08 MED ORDER — MONTELUKAST SODIUM 10 MG PO TABS: 10.0000 mg | ORAL_TABLET | Freq: Every evening | ORAL | 2 refills | Status: DC

## 2016-11-08 MED ORDER — CARVEDILOL 3.125 MG PO TABS: ORAL_TABLET | ORAL | 2 refills | Status: DC

## 2016-11-08 MED ORDER — LEVOTHYROXINE SODIUM 112 MCG PO TABS: ORAL_TABLET | ORAL | 2 refills | Status: DC

## 2016-11-08 MED ORDER — PANTOPRAZOLE SODIUM 40 MG PO TBEC: 40.0000 mg | DELAYED_RELEASE_TABLET | Freq: Two times a day (BID) | ORAL | 0 refills | Status: DC

## 2016-11-08 MED ORDER — NITROGLYCERIN 0.4 MG SL SUBL: 0.4000 mg | SUBLINGUAL_TABLET | SUBLINGUAL | 2 refills | Status: DC | PRN

## 2016-11-08 MED ORDER — TIOTROPIUM BROMIDE MONOHYDRATE 18 MCG IN CAPS: ORAL_CAPSULE | RESPIRATORY_TRACT | 2 refills | Status: DC

## 2016-11-08 MED ORDER — PROAIR HFA 108 (90 BASE) MCG/ACT IN AERS: 2.0000 | INHALATION_SPRAY | Freq: Four times a day (QID) | RESPIRATORY_TRACT | 2 refills | Status: DC | PRN

## 2016-11-08 MED ORDER — FUROSEMIDE 40 MG PO TABS: 20.0000 mg | ORAL_TABLET | Freq: Every day | ORAL | 3 refills | Status: DC

## 2016-11-08 MED ORDER — LOSARTAN POTASSIUM 25 MG PO TABS: 25.0000 mg | ORAL_TABLET | Freq: Every day | ORAL | 11 refills | Status: DC

## 2016-11-08 MED ORDER — FLUTICASONE-SALMETEROL 250-50 MCG/DOSE IN AEPB: INHALATION_SPRAY | RESPIRATORY_TRACT | 2 refills | Status: DC

## 2016-11-08 NOTE — Patient Instructions (Addendum)
Carbohydrate Counting for Diabetes Mellitus, Adult Carbohydrate counting is a method for keeping track of how many carbohydrates you eat. Eating carbohydrates naturally increases the amount of sugar (glucose) in the blood. Counting how many carbohydrates you eat helps keep your blood glucose within normal limits, which helps you manage your diabetes (diabetes mellitus). It is important to know how many carbohydrates you can safely have in each meal. This is different for every person. A diet and nutrition specialist (registered dietitian) can help you make a meal plan and calculate how many carbohydrates you should have at each meal and snack. Carbohydrates are found in the following foods:  Grains, such as breads and cereals.  Dried beans and soy products.  Starchy vegetables, such as potatoes, peas, and corn.  Fruit and fruit juices.  Milk and yogurt.  Sweets and snack foods, such as cake, cookies, candy, chips, and soft drinks. How do I count carbohydrates? There are two ways to count carbohydrates in food. You can use either of the methods or a combination of both. Reading "Nutrition Facts" on packaged food  The "Nutrition Facts" list is included on the labels of almost all packaged foods and beverages in the [place] It includes:  The serving size.  Information about nutrients in each serving, including the grams (g) of carbohydrate per serving. To use the "Nutrition Facts":  Decide how many servings you will have.  Multiply the number of servings by the number of carbohydrates per serving.  The resulting number is the total amount of carbohydrates that you will be having. Learning standard serving sizes of other foods  When you eat foods containing carbohydrates that are not packaged or do not include "Nutrition Facts" on the label, you need to measure the servings in order to count the amount of carbohydrates:  Measure the foods that you will eat with a food scale or measuring  cup, if needed.  Decide how many standard-size servings you will eat.  Multiply the number of servings by 15. Most carbohydrate-rich foods have about 15 g of carbohydrates per serving.  For example, if you eat 8 oz (170 g) of strawberries, you will have eaten 2 servings and 30 g of carbohydrates (2 servings x 15 g = 30 g).  For foods that have more than one food mixed, such as soups and casseroles, you must count the carbohydrates in each food that is included. The following list contains standard serving sizes of common carbohydrate-rich foods. Each of these servings has about 15 g of carbohydrates:   hamburger bun or  English muffin.   oz (15 mL) syrup.   oz (14 g) jelly.  1 slice of bread.  1 six-inch tortilla.  3 oz (85 g) cooked rice or pasta.  4 oz (113 g) cooked dried beans.  4 oz (113 g) starchy vegetable, such as peas, corn, or potatoes.  4 oz (113 g) hot cereal.  4 oz (113 g) mashed potatoes or  of a large baked potato.  4 oz (113 g) canned or frozen fruit.  4 oz (120 mL) fruit juice.  4-6 crackers.  6 chicken nuggets.  6 oz (170 g) unsweetened dry cereal.  6 oz (170 g) plain fat-free yogurt or yogurt sweetened with artificial sweeteners.  8 oz (240 mL) milk.  8 oz (170 g) fresh fruit or one small piece of fruit.  24 oz (680 g) popped popcorn. Example of carbohydrate counting Sample meal   3 oz (85 g) chicken breast.  6  oz (170 g) brown rice.  4 oz (113 g) corn.  8 oz (240 mL) milk.  8 oz (170 g) strawberries with sugar-free whipped topping. Carbohydrate calculation  1. Identify the foods that contain carbohydrates:  Rice.  Corn.  Milk.  Strawberries. 2. Calculate how many servings you have of each food:  2 servings rice.  1 serving corn.  1 serving milk.  1 serving strawberries. 3. Multiply each number of servings by 15 g:  2 servings rice x 15 g = 30 g.  1 serving corn x 15 g = 15 g.  1 serving milk x 15 g = 15  g.  1 serving strawberries x 15 g = 15 g. 4. Add together all of the amounts to find the total grams of carbohydrates eaten:  30 g + 15 g + 15 g + 15 g = 75 g of carbohydrates total. This information is not intended to replace advice given to you by your health care provider. Make sure you discuss any questions you have with your health care provider. Document Released: 08/06/2005 Document Revised: 02/24/2016 Document Reviewed: 01/18/2016 Elsevier Interactive Patient Education  2017 [place].   Diabetes Mellitus and Exercise Exercising regularly is important for your overall health, especially when you have diabetes (diabetes mellitus). Exercising is not only about losing weight. It has many health benefits, such as increasing muscle strength and bone density and reducing body fat and stress. This leads to improved fitness, flexibility, and endurance, all of which result in better overall health. Exercise has additional benefits for people with diabetes, including:  Reducing appetite.  Helping to lower and control blood glucose.  Lowering blood pressure.  Helping to control amounts of fatty substances (lipids) in the blood, such as cholesterol and triglycerides.  Helping the body to respond better to insulin (improving insulin sensitivity).  Reducing how much insulin the body needs.  Decreasing the risk for heart disease by:  Lowering cholesterol and triglyceride levels.  Increasing the levels of good cholesterol.  Lowering blood glucose levels. What is my activity plan? Your health care provider or certified diabetes educator can help you make a plan for the type and frequency of exercise (activity plan) that works for you. Make sure that you:  Do at least 150 minutes of moderate-intensity or vigorous-intensity exercise each week. This could be brisk walking, biking, or water aerobics.  Do stretching and strength exercises, such as yoga or weightlifting, at least 2 times  a week.  Spread out your activity over at least 3 days of the week.  Get some form of physical activity every day.  Do not go more than 2 days in a row without some kind of physical activity.  Avoid being inactive for more than 90 minutes at a time. Take frequent breaks to walk or stretch.  Choose a type of exercise or activity that you enjoy, and set realistic goals.  Start slowly, and gradually increase the intensity of your exercise over time. What do I need to know about managing my diabetes?  Check your blood glucose before and after exercising.  If your blood glucose is higher than 240 mg/dL (13.3 mmol/L) before you exercise, check your urine for ketones. If you have ketones in your urine, do not exercise until your blood glucose returns to normal.  Know the symptoms of low blood glucose (hypoglycemia) and how to treat it. Your risk for hypoglycemia increases during and after exercise. Common symptoms of hypoglycemia can include:  Hunger.  Anxiety.  Sweating and feeling clammy.  Confusion.  Dizziness or feeling light-headed.  Increased heart rate or palpitations.  Blurry vision.  Tingling or numbness around the mouth, lips, or tongue.  Tremors or shakes.  Irritability.  Keep a rapid-acting carbohydrate snack available before, during, and after exercise to help prevent or treat hypoglycemia.  Avoid injecting insulin into areas of the body that are going to be exercised. For example, avoid injecting insulin into:  The arms, when playing tennis.  The legs, when jogging.  Keep records of your exercise habits. Doing this can help you and your health care provider adjust your diabetes management plan as needed. Write down:  Food that you eat before and after you exercise.  Blood glucose levels before and after you exercise.  The type and amount of exercise you have done.  When your insulin is expected to peak, if you use insulin. Avoid exercising at times when  your insulin is peaking.  When you start a new exercise or activity, work with your health care provider to make sure the activity is safe for you, and to adjust your insulin, medicines, or food intake as needed.  Drink plenty of water while you exercise to prevent dehydration or heat stroke. Drink enough fluid to keep your urine clear or pale yellow. This information is not intended to replace advice given to you by your health care provider. Make sure you discuss any questions you have with your health care provider. Document Released: 10/27/2003 Document Revised: 02/24/2016 Document Reviewed: 01/16/2016 Elsevier Interactive Patient Education  2017 [employer]   Medications refilled, please continue as directed. A1c today 11.0, last A1c on 11/17 was 10.6 When you see Dr. Balan/Endocrinologist  On 11/15/16 expect them to adjust your insulin dosages. Increase regular movement, recommend at least 30 mins 5 times weekly-this is very important to help manage you Diabetes and reduce your A1c. Please follow Diabetic diet. Continue with various specialists. Follow-up here in 3 months or sooner If needed.

## 2016-11-08 NOTE — Assessment & Plan Note (Signed)
[facility]earth Healthy Diet Increase regular movement-discussed this at [facility] that movement will reduce blood sugar and improve overall health. Continue Atorvastatin 20 mg daily.

## 2016-11-08 NOTE — Progress Notes (Signed)
Subjective:    Patient ID: [name], female    DOB: 01/12/1934, 83 y.o.   MRN: [id number]  HPI:  Ms. Nawrot is here for regular f/u and medication refill.  She reports medication compliance and denies SE.  She needs "refills on practically everything".  She denies regular movement, despite Dr. Sharee Holster discussing this at length with her at her previous OVs.  She denies CP/dyspnea/hypotension/syncope/palpitations/episodes of hypoglycemia.  She estimates that last dose of Nitroglycerin was > 3 years ago.  She is followed by Cardiology, last visit 06/2016 with a f/u in 6 months. Her next Endocrinology appt. Is 11/15/2016.She did not bring her BS log with her today.  She reports "diet is ok, I know I need to get up and move more". She feels like her overall health "is good".     Review of Systems  Constitutional: Negative for activity change, appetite change, chills, diaphoresis, fatigue, fever and unexpected weight change.  HENT: Positive for congestion and sinus pressure.   Eyes: Negative for visual disturbance.  Respiratory: Negative for cough, chest tightness, shortness of breath, wheezing and stridor.   Cardiovascular: Negative for chest pain, palpitations and leg swelling.  Gastrointestinal: Negative for abdominal distention, abdominal pain, blood in stool, constipation, diarrhea, nausea and vomiting.  Endocrine: Negative for cold intolerance, heat intolerance, polydipsia, polyphagia and polyuria.  Genitourinary: Negative for difficulty urinating and flank pain.  Musculoskeletal: Positive for arthralgias, back pain and myalgias.  Skin: Negative for color change, pallor, rash and wound.  Neurological: Negative for dizziness, light-headedness and numbness.  Psychiatric/Behavioral: Negative for agitation, self-injury, sleep disturbance and suicidal ideas. The patient is not nervous/anxious.        Objective:   Physical Exam  Constitutional: She is oriented to person, place, and time. She  appears well-developed and well-nourished. No distress.  HENT:  Head: Normocephalic and atraumatic.  Right Ear: External ear normal.  Left Ear: External ear normal.  Eyes: Conjunctivae are normal. Pupils are equal, round, and reactive to light.  Neck: Normal range of motion. Neck supple.  Cardiovascular: Normal rate, regular rhythm and intact distal pulses.   No murmur heard. Distant however audible   Pulmonary/Chest: Effort normal and breath sounds normal. No respiratory distress. She has no wheezes. She has no rales. She exhibits no tenderness.  Abdominal: Soft. Bowel sounds are normal. She exhibits no distension and no mass. There is no tenderness. There is no rebound and no guarding.  Lymphadenopathy:    She has no cervical adenopathy.  Neurological: She is alert and oriented to person, place, and time.  Skin: Skin is warm and dry. No rash noted. She is not diaphoretic. No erythema. No pallor.  Psychiatric: She has a normal mood and affect. Her behavior is normal. Judgment and thought content normal.          Assessment & Plan:   1. Insulin dependent diabetes mellitus with complications (HCC)   2. Mixed hyperlipidemia   3. Hypotension, unspecified hypotension type     Insulin dependent diabetes mellitus with complications (HCC)  Medications refilled, please continue as directed. A1c today 11.0, last A1c on 11/17 was 10.6 When you see Dr. Balan/Endocrinologist  On 11/15/16 expect them to adjust your insulin dosages. Increase regular movement, recommend at least 30 mins 5 times weekly-this is very important to help manage you Diabetes and reduce your A1c. Please follow Diabetic diet. Continue with various specialists. Follow-up here in 3 months or sooner If needed.  Hypotension Continue on Carvedilol 3.125  daily. Denies any episodes of hypotension/dizziness/hypotension. Increase water intake.   Mixed hyperlipidemia [place] Healthy Diet Increase regular  movement-[facility] that movement will reduce blood sugar and improve overall health. Continue Atorvastatin 20 mg daily.    FOLLOW-UP:  Return in about 3 months (around 02/08/2017) for Regular Follow Up, Diabetes.

## 2016-11-08 NOTE — Assessment & Plan Note (Signed)
  Medications refilled, please continue as directed. A1c today 11.0, last A1c on 11/17 was 10.6 When you see Dr. Balan/Endocrinologist  On 11/15/16 expect them to adjust your insulin dosages. Increase regular movement, recommend at least 30 mins 5 times weekly-this is very important to help manage you Diabetes and reduce your A1c. Please follow Diabetic diet. Continue with various specialists. Follow-up here in 3 months or sooner If needed.

## 2016-11-08 NOTE — Assessment & Plan Note (Signed)
Continue on Carvedilol 3.125 daily. Denies any episodes of hypotension/dizziness/hypotension. Increase water intake.

## 2016-11-14 ENCOUNTER — Ambulatory Visit (INDEPENDENT_AMBULATORY_CARE_PROVIDER_SITE_OTHER): Payer: PPO | Admitting: Ophthalmology

## 2016-11-14 DIAGNOSIS — H35033 Hypertensive retinopathy, bilateral: Secondary | ICD-10-CM

## 2016-11-14 DIAGNOSIS — I10 Essential (primary) hypertension: Secondary | ICD-10-CM | POA: Diagnosis not present

## 2016-11-14 DIAGNOSIS — E11311 Type 2 diabetes mellitus with unspecified diabetic retinopathy with macular edema: Secondary | ICD-10-CM

## 2016-11-14 DIAGNOSIS — E113511 Type 2 diabetes mellitus with proliferative diabetic retinopathy with macular edema, right eye: Secondary | ICD-10-CM

## 2016-11-14 DIAGNOSIS — H43813 Vitreous degeneration, bilateral: Secondary | ICD-10-CM | POA: Diagnosis not present

## 2016-11-14 DIAGNOSIS — E113592 Type 2 diabetes mellitus with proliferative diabetic retinopathy without macular edema, left eye: Secondary | ICD-10-CM

## 2016-12-06 ENCOUNTER — Other Ambulatory Visit: Payer: Self-pay | Admitting: Family Medicine

## 2016-12-10 DIAGNOSIS — I10 Essential (primary) hypertension: Secondary | ICD-10-CM | POA: Diagnosis not present

## 2016-12-10 DIAGNOSIS — E7800 Pure hypercholesterolemia, unspecified: Secondary | ICD-10-CM | POA: Diagnosis not present

## 2016-12-10 DIAGNOSIS — E1165 Type 2 diabetes mellitus with hyperglycemia: Secondary | ICD-10-CM | POA: Diagnosis not present

## 2016-12-10 DIAGNOSIS — E039 Hypothyroidism, unspecified: Secondary | ICD-10-CM | POA: Diagnosis not present

## 2016-12-20 ENCOUNTER — Telehealth: Payer: Self-pay | Admitting: Family Medicine
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
## 2016-12-20 NOTE — Telephone Encounter (Signed)
Evening Tonya, Can you please route to Dr. Sharee Holsterpalski. Thanks! Orpha BurKaty

## 2016-12-20 NOTE — Telephone Encounter (Signed)
LVM for pt to call to discuss.  T. Nelson, CMA  

## 2016-12-21 MED ORDER — ROSUVASTATIN CALCIUM 20 MG PO TABS: 20.0000 mg | ORAL_TABLET | Freq: Every day | ORAL | 0 refills | Status: DC

## 2016-12-21 NOTE — Telephone Encounter (Signed)
LVM for pt informing her that she will need to have liver function tests done in 6-8 weeks.  Charyl Bigger, CMA

## 2016-12-21 NOTE — Telephone Encounter (Signed)
Change to Crestor please- 20 mg

## 2016-12-21 NOTE — Addendum Note (Signed)
Addended by: Stan HeadNELSON, Tamlyn Sides S on: 12/21/2016 12:24 PM   Modules accepted: Orders

## 2016-12-21 NOTE — Telephone Encounter (Signed)
Please advise.  T. Nelson, CMA 

## 2016-12-28 ENCOUNTER — Telehealth: Payer: Self-pay | Admitting: Family Medicine

## 2016-12-28 NOTE — Telephone Encounter (Signed)
Patient is requesting a refill of her Vit D sent to [facility] not through mail order

## 2016-12-31 MED ORDER — VITAMIN D (ERGOCALCIFEROL) 1.25 MG (50000 UNIT) PO CAPS: 50000.0000 [IU] | ORAL_CAPSULE | ORAL | 2 refills | Status: DC

## 2016-12-31 NOTE — Addendum Note (Signed)
Addended by: Fonnie Mu on: 12/31/2016 08:07 AM   Modules accepted: Orders

## 2017-01-07 ENCOUNTER — Telehealth: Payer: Self-pay | Admitting: Family Medicine

## 2017-01-07 MED ORDER — FREESTYLE LITE DEVI: 0 refills | Status: AC

## 2017-01-07 NOTE — Telephone Encounter (Signed)
Patient is requesting a refill of her Vit D and needs a new blood glucose meter (shes uses the Free Style Lite) both sent to [facility]

## 2017-01-07 NOTE — Addendum Note (Signed)
Addended by: Fonnie Mu on: 01/07/2017 03:26 PM   Modules accepted: Orders

## 2017-01-07 NOTE — Telephone Encounter (Signed)
Vitamin D not refilled because it was sent to the pharmacy on 12/31/16 for #12 (3 month supply) with 2 refills.  Charyl Bigger, CMA

## 2017-01-08 ENCOUNTER — Other Ambulatory Visit: Payer: Self-pay | Admitting: Family Medicine

## 2017-01-08 MED ORDER — GLUCOSE BLOOD VI STRP: ORAL_STRIP | 12 refills | Status: DC

## 2017-01-08 NOTE — Telephone Encounter (Signed)
Patient called states need  Diabetic monitor (strips) Rx called into [facility] ---- ( Freestyle light) is the type of monitor she uses. --glh

## 2017-01-08 NOTE — Telephone Encounter (Signed)
Free style test strips sent to [facility].

## 2017-01-28 ENCOUNTER — Telehealth: Payer: Self-pay | Admitting: Family Medicine

## 2017-01-28 MED ORDER — FUROSEMIDE 40 MG PO TABS: 20.0000 mg | ORAL_TABLET | Freq: Every day | ORAL | 3 refills | Status: DC

## 2017-01-28 NOTE — Telephone Encounter (Signed)
Patient is requesting a refill of her Lasix sent to [facility] (not to [facility]).

## 2017-01-28 NOTE — Addendum Note (Signed)
Addended by: Amado Coe on: 01/28/2017 01:50 PM   Modules accepted: Orders

## 2017-01-28 NOTE — Telephone Encounter (Signed)
Lasix sent to [facility].  Follow up appointment made.

## 2017-02-01 ENCOUNTER — Other Ambulatory Visit: Payer: Self-pay | Admitting: Adult Health

## 2017-02-11 ENCOUNTER — Ambulatory Visit: Payer: PPO | Admitting: Family Medicine

## 2017-02-19 ENCOUNTER — Telehealth: Payer: Self-pay | Admitting: Family Medicine

## 2017-02-22 ENCOUNTER — Ambulatory Visit: Payer: PPO | Admitting: Family Medicine

## 2017-02-26 DIAGNOSIS — G4733 Obstructive sleep apnea (adult) (pediatric): Secondary | ICD-10-CM | POA: Diagnosis not present

## 2017-03-01 DIAGNOSIS — Z961 Presence of intraocular lens: Secondary | ICD-10-CM | POA: Diagnosis not present

## 2017-03-01 DIAGNOSIS — H04123 Dry eye syndrome of bilateral lacrimal glands: Secondary | ICD-10-CM | POA: Diagnosis not present

## 2017-03-01 DIAGNOSIS — H18413 Arcus senilis, bilateral: Secondary | ICD-10-CM | POA: Diagnosis not present

## 2017-03-01 DIAGNOSIS — Z794 Long term (current) use of insulin: Secondary | ICD-10-CM | POA: Diagnosis not present

## 2017-03-01 DIAGNOSIS — Z7984 Long term (current) use of oral hypoglycemic drugs: Secondary | ICD-10-CM | POA: Diagnosis not present

## 2017-03-01 DIAGNOSIS — S0500XA Injury of conjunctiva and corneal abrasion without foreign body, unspecified eye, initial encounter: Secondary | ICD-10-CM | POA: Diagnosis not present

## 2017-03-01 DIAGNOSIS — H11153 Pinguecula, bilateral: Secondary | ICD-10-CM | POA: Diagnosis not present

## 2017-03-29 ENCOUNTER — Other Ambulatory Visit: Payer: Self-pay | Admitting: Adult Health

## 2017-03-29 DIAGNOSIS — G4733 Obstructive sleep apnea (adult) (pediatric): Secondary | ICD-10-CM | POA: Diagnosis not present

## 2017-04-26 ENCOUNTER — Other Ambulatory Visit: Payer: Self-pay | Admitting: Family Medicine

## 2017-04-26 ENCOUNTER — Other Ambulatory Visit: Payer: Self-pay | Admitting: Adult Health

## 2017-04-29 DIAGNOSIS — G4733 Obstructive sleep apnea (adult) (pediatric): Secondary | ICD-10-CM | POA: Diagnosis not present

## 2017-05-09 ENCOUNTER — Ambulatory Visit: Payer: PPO | Admitting: Family Medicine

## 2017-05-21 ENCOUNTER — Other Ambulatory Visit: Payer: Self-pay | Admitting: Family Medicine

## 2017-05-21 NOTE — Telephone Encounter (Signed)
Pt given 1 pen only!!  Staff message sent to Annabell Sabal to call pt to schedule f/u OV with Dr. Raliegh Scarlet.  Charyl Bigger, CMA

## 2017-05-22 ENCOUNTER — Ambulatory Visit (INDEPENDENT_AMBULATORY_CARE_PROVIDER_SITE_OTHER): Payer: PPO | Admitting: Ophthalmology

## 2017-05-22 ENCOUNTER — Encounter: Payer: Self-pay | Admitting: Family Medicine

## 2017-05-22 ENCOUNTER — Ambulatory Visit (INDEPENDENT_AMBULATORY_CARE_PROVIDER_SITE_OTHER): Payer: PPO | Admitting: Family Medicine

## 2017-05-22 VITALS — BP 100/58 | HR 61 | Ht 60.0 in | Wt 190.0 lb

## 2017-05-22 DIAGNOSIS — Z23 Encounter for immunization: Secondary | ICD-10-CM | POA: Diagnosis not present

## 2017-05-22 DIAGNOSIS — E1069 Type 1 diabetes mellitus with other specified complication: Secondary | ICD-10-CM

## 2017-05-22 DIAGNOSIS — J849 Interstitial pulmonary disease, unspecified: Secondary | ICD-10-CM | POA: Diagnosis not present

## 2017-05-22 DIAGNOSIS — E782 Mixed hyperlipidemia: Secondary | ICD-10-CM | POA: Diagnosis not present

## 2017-05-22 DIAGNOSIS — I10 Essential (primary) hypertension: Secondary | ICD-10-CM

## 2017-05-22 DIAGNOSIS — I152 Hypertension secondary to endocrine disorders: Secondary | ICD-10-CM | POA: Insufficient documentation

## 2017-05-22 DIAGNOSIS — D696 Thrombocytopenia, unspecified: Secondary | ICD-10-CM

## 2017-05-22 DIAGNOSIS — E781 Pure hyperglyceridemia: Secondary | ICD-10-CM | POA: Diagnosis not present

## 2017-05-22 DIAGNOSIS — E786 Lipoprotein deficiency: Secondary | ICD-10-CM

## 2017-05-22 DIAGNOSIS — J42 Unspecified chronic bronchitis: Secondary | ICD-10-CM

## 2017-05-22 DIAGNOSIS — I5032 Chronic diastolic (congestive) heart failure: Secondary | ICD-10-CM

## 2017-05-22 DIAGNOSIS — E559 Vitamin D deficiency, unspecified: Secondary | ICD-10-CM | POA: Diagnosis not present

## 2017-05-22 DIAGNOSIS — E876 Hypokalemia: Secondary | ICD-10-CM | POA: Diagnosis not present

## 2017-05-22 DIAGNOSIS — Z9189 Other specified personal risk factors, not elsewhere classified: Secondary | ICD-10-CM

## 2017-05-22 DIAGNOSIS — Z794 Long term (current) use of insulin: Secondary | ICD-10-CM

## 2017-05-22 DIAGNOSIS — E118 Type 2 diabetes mellitus with unspecified complications: Secondary | ICD-10-CM

## 2017-05-22 DIAGNOSIS — E1159 Type 2 diabetes mellitus with other circulatory complications: Secondary | ICD-10-CM | POA: Diagnosis not present

## 2017-05-22 DIAGNOSIS — IMO0001 Reserved for inherently not codable concepts without codable children: Secondary | ICD-10-CM

## 2017-05-22 LAB — POCT UA - MICROALBUMIN
Albumin/Creatinine Ratio, Urine, POC: <30
Creatinine, POC: 50 mg/dL
Microalbumin Ur, POC: 10 mg/L

## 2017-05-22 LAB — POCT GLYCOSYLATED HEMOGLOBIN (HGB A1C): Hemoglobin A1C: 9.2

## 2017-05-22 MED ORDER — PNEUMOCOCCAL VAC POLYVALENT 25 MCG/0.5ML IJ INJ
0.5000 mL | INJECTION | INTRAMUSCULAR | Status: AC
  Administered 2017-05-22: 0.5 mL via INTRAMUSCULAR

## 2017-05-22 NOTE — Progress Notes (Signed)
Impression and Recommendations:    1. Need for Tdap vaccination   2. Need for pneumococcal vaccination   3. Flu vaccine need   4. Insulin dependent diabetes mellitus with complications (HCC)   5. Heart failure, diastolic, chronic (HCC)   6. Hypertension associated with diabetes (HCC)   7. Vitamin D insufficiency   8. Hypertriglyceridemia   9. Low level of high density lipoprotein (HDL)   10. At high risk for osteoporosis   11. Thrombocytopenia, unspecified (HCC)   12. ILD (interstitial lung disease) (HCC)   13. Hypokalemia   14. Vitamin D deficiency   15. Mixed diabetic hyperlipidemia associated with type 1 diabetes mellitus (HCC)   16. Chronic bronchitis, unspecified chronic bronchitis type (HCC)      1. Need for Tdap vaccination  - Tdap vaccine greater than or equal to 7yo IM  2. Need for pneumococcal vaccination  - pneumococcal 23 valent vaccine (PNU-IMMUNE) injection 0.5 mL; Inject 0.5 mLs into the muscle tomorrow at 10 am.  3. Flu vaccine need  - Flu vaccine HIGH DOSE PF  In 6 weeks please make a follow-up so we can see how you're doing with your blood sugars.  Please bring your blood sugar log in for my review.  Check your fasting blood sugars and 2 hours after largest meal daily.  Also, check it whenever you feel poorly.  - Please make sure you follow up the appointment with nutrition for your diabetes.  It's important you get consistent and healthier with your diet for better control of your diabetes.  Also please try to walk 20 minutes every day.   Education and routine counseling performed. Handouts provided.  New Prescriptions   No medications on file    Meds ordered this encounter  Medications  . simvastatin (ZOCOR) 10 MG tablet    Sig: Take 1 tablet by mouth daily.  . pneumococcal 23 valent vaccine (PNU-IMMUNE) injection 0.5 mL    Modified Medications   No medications on file    Discontinued Medications   ATORVASTATIN (LIPITOR) 20 MG  TABLET    Take 1 tablet (20 mg total) by mouth daily.   INSULIN GLARGINE (LANTUS SOLOSTAR) 100 UNIT/ML SOLOSTAR PEN    INJECT 64 UNITS SUBCUTANEOUSLY EVERY NIGHT AT BEDTIME AND 30 UNITS 12 HOURS LATER.   ROSUVASTATIN (CRESTOR) 20 MG TABLET    Take 1 tablet (20 mg total) by mouth daily.     Orders Placed This Encounter  Procedures  . Tdap vaccine greater than or equal to 7yo IM  . Flu vaccine HIGH DOSE PF  . Microalbumin / creatinine urine ratio  . CBC with Differential/Platelet  . Comprehensive metabolic panel  . Lipid panel  . Hemoglobin A1c  . T4, free  . TSH  . VITAMIN D 25 Hydroxy (Vit-D Deficiency, Fractures)  . Amb Referral to Nutrition and Diabetic E  . POCT glycosylated hemoglobin (Hb A1C)     Return for 4wks- complete FBW only; then 6wk- OV f/up DM, BS logs, insulin, labs.  The patient was counseled, risk factors were discussed, anticipatory guidance given.  Gross side effects, risk and benefits, and alternatives of medications discussed with patient.  Patient is aware that all medications have potential side effects and we are unable to predict every side effect or drug-drug interaction that may occur.  Expresses verbal understanding and consents to current therapy plan and treatment regimen.  Please see AVS handed out to patient at the end of our visit for  further patient instructions/ counseling done pertaining to today's office visit.    Note: This document was prepared using Dragon voice recognition software and may include unintentional dictation errors.     Subjective:    Chief Complaint  Patient presents with  . Follow-up     [name] is a 83 y.o. female who presents to [facility] today for Diabetes Management.     DM HPI: -  She has not been working on diet and exercise for diabetes-   Has been working on low carb more veggies.   Patient recalls the last time her A1c was under decent control was sometime early last  year in 2017.     Lantus- 30U every AM and 69 units at night  Novolog- 30 units ea meal TID=  this change was done by her endocrinologist 2-3 weeks ago.  Pt is currently maintained on the following medications for diabetes by ENDO:   see med list today  Medication compliance -   Home glucose readings range  FBS- checking q am--> 110's - 200;  Even having some lows in middle of the night.  57 one night recently- got up to eat due to dizzy.  Patient is not consistent with her diet.  Sometimes she skips meals.  Also sometimes she doesn't eat so well.  This is more likely the reason why her sugars are all over the place.     Last time was at a nutritionist or diabetic educator was 4-5 years ago.    Denies polyuria/polydipsia. Denies hypo/ hyperglycemia symptoms - She denies new onset of: chest pain, exercise intolerance, shortness of breath, dizziness, visual changes, headache, lower extremity swelling or claudication.     Last diabetic eye exam was  Lab Results  Component Value Date   HMDIABEYEEXA Retinopathy (A) 04/30/2016  Dr Broadus John and Ashley Royalty- eye exam as well.  Last diabetic eye exam was done this past year in 2018.  Patient thinks it was prob Sept   Foot exam- UTD  Last A1C in the office was:  Lab Results  Component Value Date   HGBA1C 9.2 05/22/2017   HGBA1C 11.0 11/08/2016   HGBA1C 10.6 06/20/2016    Lab Results  Component Value Date   MICROALBUR <0.2 03/13/2016   LDLCALC 56 05/02/2016   CREATININE 0.82 05/02/2016    1. HTN HPI:  -  Her blood pressure running low today but she denies symptoms.  She denies checking her blood pressure at home.  She denies any new onset of dizziness, visual change, chest pain etc.  - Patient reports good compliance with blood pressure medications  - Denies medication S-E   - Smoking Status noted   - She denies new onset of: chest pain, exercise intolerance, shortness of breath, dizziness, visual changes, headache, lower  extremity swelling or claudication.   Today their BP is BP: (!) 100/58   Last 3 blood pressure readings in our office are as follows: BP Readings from Last 3 Encounters:  05/22/17 (!) 100/58  11/08/16 (!) 110/55  07/17/16 (!) 120/52     Filed Weights   05/22/17 1048  Weight: 190 lb (86.2 kg)      Last 3 blood pressure readings in our office are as follows: BP Readings from Last 3 Encounters:  05/22/17 (!) 100/58  11/08/16 (!) 110/55  07/17/16 (!) 120/52    BMI Readings from Last 3 Encounters:  05/22/17 37.11 kg/m  11/08/16 35.97 kg/m  07/17/16  36.29 kg/m     Problem  Hypertension Associated With Diabetes (Hcc)  Mixed Diabetic Hyperlipidemia Associated With Type 1 Diabetes Mellitus (Hcc)  Depression   On Lexapro       Patient Care Team    Relationship Specialty Notifications Start End  Thomasene Lot, DO PCP - General Family Medicine  03/12/16   Kalman Shan, MD Attending Physician Pulmonary Disease Abnormal results only, Admissions 12/07/12   Marcellus Scott, MD Attending Physician Pulmonary Disease  05/01/16   Nahser, Deloris Ping, MD Consulting Physician Cardiology  05/01/16   Lynann Bologna, MD Referring Physician Gastroenterology  05/01/16   Dorisann Frames, MD Consulting Physician Endocrinology  05/22/17      Patient Active Problem List   Diagnosis Date Noted  . Hypertension associated with diabetes (HCC) 05/22/2017    Priority: High  . Obesity, Class I, BMI 30-34.9 06/23/2016    Priority: High  . Hypothyroidism 03/13/2016    Priority: High  . Insulin dependent diabetes mellitus with complications (HCC) 03/13/2016    Priority: High  . 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 Depression 03/13/2016    Priority: Medium  . Coronary artery disease 04/11/2011    Priority: Medium  . COPD (chronic obstructive pulmonary disease) (HCC) 04/11/2011    Priority: Medium  . Obstructive  sleep apnea 04/11/2011    Priority: Medium  . Heart failure, diastolic, chronic (HCC) 03/29/2008    Priority: Medium  . Vitamin D deficiency 05/11/2016    Priority: Low  . Atherosclerosis of carotid arteries 04/03/2006    Priority: Low  . Chronic leukopenia 05/11/2016  . Elevated serum alkaline phosphatase level- hepatocellular in origin 05/11/2016  . Constipation, chronic 03/13/2016  . At high risk for osteoporosis 03/13/2016  . Hypotension 12/13/2012  . Hypokalemia 12/13/2012  . Thrombocytopenia, unspecified (HCC) 12/13/2012  . h/o Clostridium difficile colitis 12/12/2012  . History of GI bleed 12/12/2012  . ILD (interstitial lung disease) (HCC) 11/25/2012     Past Medical History:  Diagnosis Date  . Cirrhosis, non-alcoholic (HCC)   . Coronary artery disease    status post inferior wall myocardial infarction  . Diabetes mellitus   . Dyslipidemia   . Hypotension 08/02/2010   recent episodes of orthostatic hypotension  . Hypothyroidism   . Leg edema   . Myocardial infarct, old   . Neuropathy of hand    left hand numbness/neuropathy  . Obesity   . Sleep apnea      Past Surgical History:  Procedure Laterality Date  . ABDOMINAL HYSTERECTOMY    . CARDIAC CATHETERIZATION     The ejection fraction is around 50%  . CHOLECYSTECTOMY    . CORONARY STENT PLACEMENT    . ESOPHAGOGASTRODUODENOSCOPY N/A 12/12/2012   Procedure: ESOPHAGOGASTRODUODENOSCOPY (EGD);  Surgeon: Florencia Reasons, MD;  Location: [facility];  Service: Endoscopy;  Laterality: N/A;  . FLEXIBLE SIGMOIDOSCOPY N/A 12/12/2012   Procedure: FLEXIBLE SIGMOIDOSCOPY;  Surgeon: Florencia Reasons, MD;  Location: [facility];  Service: Endoscopy;  Laterality: N/A;  . TUBAL LIGATION    . WRIST SURGERY       Family History  Problem Relation Age of Onset  . Emphysema Brother        non smoker  . Diabetes Brother   . Hyperlipidemia Brother   . Hypertension Brother   . Heart disease Father   . Heart attack Father    . Diabetes Father   . Hyperlipidemia Father   .  Hypertension Father   . Diabetes Mother   . Diabetes Sister   . Hyperlipidemia Sister   . Hypertension Sister   . Heart attack Daughter   . Diabetes Daughter   . Diabetes Son   . Suicidality Son   . Alcohol abuse Son   . Alcohol abuse Maternal Uncle   . Heart attack Paternal Grandfather   . Diabetes Daughter   . Cancer Son        melanoma  . Diabetes Son   . Diabetes Son      History  Drug Use No  ,  History  Alcohol Use No  ,  History  Smoking Status  . Former Smoker  . Quit date: 08/20/1973  Smokeless Tobacco  . Never Used  ,    Current Outpatient Prescriptions on File Prior to Visit  Medication Sig Dispense Refill  . albuterol (PROAIR HFA) 108 (90 Base) MCG/ACT inhaler Inhale 2 puffs into the lungs every 6 (six) hours as needed. PATIENT MUST HAVE OFFICE VISIT PRIOR TO ANY FURTHER REFILLS 1 Inhaler 0  . Blood Glucose Monitoring Suppl (FREESTYLE LITE) DEVI Use to check blood sugars 2-3 times daily 1 each 0  . carvedilol (COREG) 3.125 MG tablet TAKE ONE TABLET BY MOUTH TWO TIMES DAILY WITH A MEAL. 180 tablet 2  . Cholecalciferol (VITAMIN D3) 5000 units TABS 5,000 IU OTC vitamin D3 daily. 90 tablet 3  . cycloSPORINE (RESTASIS) 0.05 % ophthalmic emulsion Place 1 drop into both eyes 2 (two) times daily.    Marland Kitchen EASY TOUCH PEN NEEDLES 31G X 5 MM MISC USE TO INJECT INSULIN 5 TIMES A DAY 200 each 11  . escitalopram (LEXAPRO) 20 MG tablet Take 1 tablet (20 mg total) by mouth daily. 90 tablet 0  . Fluticasone-Salmeterol (ADVAIR DISKUS) 250-50 MCG/DOSE AEPB TAKE 1 INHALATION BY MOUTH TWICE DAILY 60 each 2  . furosemide (LASIX) 40 MG tablet Take 0.5 tablets (20 mg total) by mouth daily. 45 tablet 3  . glucose blood (FREESTYLE LITE) test strip Test 3 to 4 times daily 120 each 12  . insulin aspart (NOVOLOG FLEXPEN) 100 UNIT/ML FlexPen INJECT 20 UNITS SUBCUTANEOUSLY THREE TIMES A DAY BEFORE MEALS; ADJUST AND INCREASE ACCORDINGLY.  MUST  HAVE OFFICE VISIT PRIOR TO ANY FURTHER REFILLS. 1 pen 0  . LANTUS SOLOSTAR 100 UNIT/ML Solostar Pen INJECT 64 UNITS SUBCUTANEOUSLY EVERY NIGHT AT BEDTIME AND 30 UNITS 12 HOURS LATER. 8 pen 2  . levothyroxine (SYNTHROID, LEVOTHROID) 112 MCG tablet TAKE 1 TABLET EVERY MORNING ON AN EMPTY STOMACH ONCE A DAY 90 tablet 2  . losartan (COZAAR) 25 MG tablet Take 1 tablet (25 mg total) by mouth daily. 30 tablet 11  . montelukast (SINGULAIR) 10 MG tablet Take 1 tablet (10 mg total) by mouth every evening. 90 tablet 2  . nitroGLYCERIN (NITROSTAT) 0.4 MG SL tablet Place 1 tablet (0.4 mg total) under the tongue every 5 (five) minutes as needed. Chest pain 25 tablet 2  . ONETOUCH DELICA LANCETS 33G MISC USE TO CHECK BLOOD SUGAR THREE TIMES A DAY 100 each 11  . pantoprazole (PROTONIX) 40 MG tablet Take 1 tablet (40 mg total) by mouth 2 (two) times daily before a meal. 62 tablet 0  . potassium chloride (KLOR-CON) 10 MEQ CR tablet Take 10 mEq by mouth 2 (two) times daily.     Marland Kitchen tiotropium (SPIRIVA HANDIHALER) 18 MCG inhalation capsule INHALE THE CONTENTS OF 1 CAPSULE VIA HANDIHALER BY MOUTH EVERY DAY 90 capsule 2  .  Vitamin D, Ergocalciferol, (DRISDOL) 50000 units CAPS capsule Take 1 capsule (50,000 Units total) by mouth every 7 (seven) days. 12 capsule 2  . White Petrolatum-Mineral Oil (SYSTANE NIGHTTIME) OINT Place 1 application into both eyes at bedtime as needed (irritation).      No current facility-administered medications on file prior to visit.      Allergies  Allergen Reactions  . Ace Inhibitors Cough  . Benadryl [Diphenhydramine Hcl (Sleep)] Other (See Comments)    hypotension  . Penicillins Hives and Swelling    Swelling of arms Has patient had a PCN reaction causing immediate rash, facial/tongue/throat swelling, SOB or lightheadedness with hypotension: unknown Has patient had a PCN reaction causing severe rash involving mucus membranes or skin necrosis: no Has patient had a PCN reaction that  required hospitalization: unknown Has patient had a PCN reaction occurring within the last 10 years: no, childhood allergy If all of the above answers are "NO", then may proceed with Cephalosporin use.   . Sulfonamide Derivatives Swelling     Review of Systems:   General:  Denies fever, chills Optho/Auditory:   Denies visual changes, blurred vision Respiratory:   Denies SOB, cough, wheeze, DIB  Cardiovascular:   Denies chest pain, palpitations, painful respirations Gastrointestinal:   Denies nausea, vomiting, diarrhea.  Endocrine:     Denies new hot or cold intolerance Musculoskeletal:  Denies joint swelling, gait issues, or new unexplained myalgias/ arthralgias Skin:  Denies rash, suspicious lesions  Neurological:    Denies dizziness, unexplained weakness, numbness  Psychiatric/Behavioral:   Denies mood changes    Objective:     Blood pressure (!) 100/58, pulse 61, height 5' (1.524 m), weight 190 lb (86.2 kg).  Body mass index is 37.11 kg/m.  General: Well Developed, well nourished, and in no acute distress.  HEENT: Normocephalic, atraumatic, pupils equal round reactive to light, neck supple, No carotid bruits, no JVD Skin: Warm and dry, cap RF less 2 sec Cardiac: Regular rate and rhythm, S1, S2 WNL's, no murmurs rubs or gallops Respiratory: ECTA B/L, Not using accessory muscles, speaking in full sentences. NeuroM-Sk: Ambulates w/o assistance, moves ext * 4 w/o difficulty, sensation grossly intact.  Ext: scant edema b/l lower ext Psych: No HI/SI, judgement and insight good, Euthymic mood. Full Affect.

## 2017-05-22 NOTE — Patient Instructions (Addendum)
In 6 weeks please make a follow-up so we can see how you're doing with your blood sugars.  Please bring your blood sugar log in for my review.  Check your fasting blood sugars and 2 hours after largest meal daily.  Also, check it whenever you feel poorly.  - Please make sure you follow up the appointment with nutrition for your diabetes.  It's important you get consistent and healthier with your diet for better control of your diabetes.  Also please try to walk 20 minutes every day.     Lantus dose 69 units at bedtime and 30U in AM.   Add the following Sliding Scale- novolog: In addition to your 30 units with each meal.  It is extremely important you do not skip meals and you try to eat consistently.  - 150-175: + 1 unit  - 176-200: + 2 units  - 201-225: + 3 units  - 226-250: + 4 units  - 251-275: + 5 units - 276-300: + 6 units  Please return in 1.5 months with your sugar log.    Basic Rules for Patients with Type I Diabetes Mellitus  1. The American Diabetes Association (ADA) recommended targets: - fasting sugar <130 - after meal sugar <180 - HbA1C <7%  2. Engage in ?150 min moderate exercise per week  3. Make sure you have ?8h of sleep every night as this helps both blood sugars and your weight.  4. Always keep a sugar log (not only record in your meter) and bring it to all appointments with [place].  5. "15-15 rule" for hypoglycemia: if sugars are low, take 15 g of carbs** ("fast sugar" - e.g. 4 glucose tablets, 4 oz orange juice), wait 15 min, then check sugars again. If still <80, repeat. Continue  until your sugars >80, then eat a normal meal.   6. Teach family members and coworkers to inject glucagon. Have a glucagon set at home and one at work. They should call 911 after using the set.  7. Check sugar before driving. If <100, correct, and only start driving if sugars rise ?100. Check sugar every hour when on a long drive.  8. Check sugar before exercising. If  <100, correct, and only start exercising if sugars rise ?100. Check sugar every hour when on a long exercise routine and 1h after you finished exercising.   If >250, check urine for ketones. If you have moderate-large ketones in urine, do not start exercise. Hydrate yourself with clear liquids and correct the high sugar. Recheck sugars and ketones before attempting to exercise.  Be aware that you might need less insulin when exercising.  *intense, short, exercise bursts can increase your sugars, but  *less intense, longer (>1h), exercise routines can decrease your sugars.   9. Make sure you have a MedAlert bracelet or pendant mentioning "Type I Diabetes Mellitus". If you have a prior episode of severe hypoglycemia or hypoglycemia unawareness, it should also mention this.  10. Please do not walk barefoot. Inspect your feet for sores/cuts and let us know if you have them.  11. Please call [facility] with any questions and concerns [phone]).   **E.g. of "fast carbs":  first choice (15 g):  1 tube glucose gel, GlucoPouch 15, 2 oz glucose liquid  second choice (15-16 g):  3 or 4 glucose tablets (best taken      with water), 15 Dextrose Bits chewable  third choice (15-20 g):   cup fruit juice,  cup regular soda,  1 cup skim milk,  1 cup sports drink  fourth choice (15-20 g):  1 small tube [place] gel (not frosting), 2 tbsp raisins, 1 tbsp table sugar,  candy, jelly beans, gum drops - check package for carb amount   (adapted from: Juluis Rainier. "Insulin therapy and hypoglycemia" Endocrinol Metab Clin N Am 2012, 41: 57-87)    Diabetes Mellitus and Standards of Medical Care  Managing diabetes (diabetes mellitus) can be complicated. Your diabetes treatment may be managed by a team of health care providers, including:  A diet and nutrition specialist (registered dietitian).  A nurse.  A certified diabetes educator (CDE).  A diabetes specialist  (endocrinologist).  An eye doctor.  A primary care provider.  A dentist.  Your health care providers follow a schedule in order to help you get the best quality of care. The following schedule is a general guideline for your diabetes management plan. Your health care providers may also give you more specific instructions.  HbA1c (hemoglobin A1c) test This test provides information about blood sugar (glucose) control over the previous 2-3 months. It is used to check whether your diabetes management plan needs to be adjusted.  If you are meeting your treatment goals, this test is done at least 2 times a year.  If you are not meeting treatment goals or if your treatment goals have changed, this test is done 4 times a year.  Blood pressure test  This test is done at every routine medical visit. For most people, the goal is less than 130/80. Ask your health care provider what your goal blood pressure should be.  Dental and eye exams  Visit your dentist two times a year.  If you have type 1 diabetes, get an eye exam 3-5 years after you are diagnosed, and then once a year after your first exam. ? If you were diagnosed with type 1 diabetes as a child, get an eye exam when you are age 27 or older and have had diabetes for 3-5 years. After the first exam, you should get an eye exam once a year.  If you have type 2 diabetes, have an eye exam as soon as you are diagnosed, and then once a year after your first exam.  Foot care exam  Visual foot exams are done at every routine medical visit. The exams check for cuts, bruises, redness, blisters, sores, or other problems with the feet.  A complete foot exam is done by your health care provider once a year. This exam includes an inspection of the structure and skin of your feet, and a check of the pulses and sensation in your feet. ? Type 1 diabetes: Get your first exam 3-5 years after diagnosis. ? Type 2 diabetes: Get your first exam as soon as  you are diagnosed.  Check your feet every day for cuts, bruises, redness, blisters, or sores. If you have any of these or other problems that are not healing, contact your health care provider.  Kidney function test (urine microalbumin)  This test is done once a year. ? Type 1 diabetes: Get your first test 5 years after diagnosis. ? Type 2 diabetes: Get your first test as soon as you are diagnosed._  If you have chronic kidney disease (CKD), get a serum creatinine and estimated glomerular filtration rate (eGFR) test once a year.  Lipid profile (cholesterol, HDL, LDL, triglycerides)  This test should be done when you are diagnosed with diabetes, and every 5 years after the first  test. If you are on medicines to lower your cholesterol, you may need to get this test done every year. ? The goal for LDL is less than 100 mg/dL (5.5 mmol/L). If you are at high risk, the goal is less than 70 mg/dL (3.9 mmol/L). ? The goal for HDL is 40 mg/dL (2.2 mmol/L) for men and 50 mg/dL(2.8 mmol/L) for women. An HDL cholesterol of 60 mg/dL (3.3 mmol/L) or higher gives some protection against heart disease. ? The goal for triglycerides is less than 150 mg/dL (8.3 mmol/L).  Immunizations  The yearly flu (influenza) vaccine is recommended for everyone 6 months or older who has diabetes.  The pneumonia (pneumococcal) vaccine is recommended for everyone 2 years or older who has diabetes. If you are 60 or older, you may get the pneumonia vaccine as a series of two separate shots.  The hepatitis B vaccine is recommended for adults shortly after they have been diagnosed with diabetes.  The Tdap (tetanus, diphtheria, and pertussis) vaccine should be given: ? According to normal childhood vaccination schedules, for children. ? Every 10 years, for adults who have diabetes.  The shingles vaccine is recommended for people who have had chicken pox and are 50 years or older.  Mental and emotional health  Screening  for symptoms of eating disorders, anxiety, and depression is recommended at the time of diagnosis and afterward as needed. If your screening shows that you have symptoms (you have a positive screening result), you may need further evaluation and be referred to a mental health care provider.  Diabetes self-management education  Education about how to manage your diabetes is recommended at diagnosis and ongoing as needed.  Treatment plan  Your treatment plan will be reviewed at every medical visit.  Summary  Managing diabetes (diabetes mellitus) can be complicated. Your diabetes treatment may be managed by a team of health care providers.  Your health care providers follow a schedule in order to help you get the best quality of care.  Standards of care including having regular physical exams, blood tests, blood pressure monitoring, immunizations, screening tests, and education about how to manage your diabetes.  Your health care providers may also give you more specific instructions based on your individual health.      Type 2 Diabetes Mellitus, Self Care, Adult Caring for yourself after you have been diagnosed with type 2 diabetes (type 2 diabetes mellitus) means keeping your blood sugar (glucose) under control with a balance of:  Nutrition.  Exercise.  Lifestyle changes.  Medicines or insulin, if necessary.  Support from your team of health care providers and others.  The following information explains what you need to know to manage your diabetes at home. What do I need to do to manage my blood glucose?  Check your blood glucose every day, as often as told by your health care provider.  Contact your health care provider if your blood glucose is above your target for 2 tests in a row.  Have your A1c (hemoglobin A1c) level checked at least two times a year, or as often as told by your health care provider. Your health care provider will set individualized treatment goals  for you. Generally, the goal of treatment is to maintain the following blood glucose levels:  Before meals (preprandial): 80-130 mg/dL (4.4-7.2 mmol/L).  After meals (postprandial): below 180 mg/dL (10 mmol/L).  A1c level: less than 7%.  What do I need to know about hyperglycemia and hypoglycemia? What is hyperglycemia? Hyperglycemia, also  called high blood glucose, occurs when blood glucose is too high.Make sure you know the early signs of hyperglycemia, such as:  Increased thirst.  Hunger.  Feeling very tired.  Needing to urinate more often than usual.  Blurry vision.  What is hypoglycemia? Hypoglycemia, also called low blood glucose, occurswith a blood glucose level at or below 70 mg/dL (3.9 mmol/L). The risk for hypoglycemia increases during or after exercise, during sleep, during illness, and when skipping meals or not eating for a long time (fasting). It is important to know the symptoms of hypoglycemia and treat it right away. Always have a 15-gram rapid-acting carbohydrate snack with you to treat low blood glucose. Family members and close friends should also know the symptoms and should understand how to treat hypoglycemia, in case you are not able to treat yourself. What are the symptoms of hypoglycemia? Hypoglycemia symptoms can include:  Hunger.  Anxiety.  Sweating and feeling clammy.  Confusion.  Dizziness or feeling light-headed.  Sleepiness.  Nausea.  Increased heart rate.  Headache.  Blurry vision.  Seizure.  Nightmares.  Tingling or numbness around the mouth, lips, or tongue.  A change in speech.  Decreased ability to concentrate.  A change in coordination.  Restless sleep.  Tremors or shakes.  Fainting.  Irritability.  How do I treat hypoglycemia?  If you are alert and able to swallow safely, follow the 15:15 rule:  Take 15 grams of a rapid-acting carbohydrate. Rapid-acting options include: ? 1 tube of glucose gel. ? 3  glucose pills. ? 6-8 pieces of hard candy. ? 4 oz (120 mL) of fruit juice. ? 4 oz (120 mL) of regular (not diet) soda.  Check your blood glucose 15 minutes after you take the carbohydrate.  If the repeat blood glucose level is still at or below 70 mg/dL (3.9 mmol/L), take 15 grams of a carbohydrate again.  If your blood glucose level does not increase above 70 mg/dL (3.9 mmol/L) after 3 tries, seek emergency medical care.  After your blood glucose level returns to normal, eat a meal or a snack within 1 hour.  How do I treat severe hypoglycemia? Severe hypoglycemia is when your blood glucose level is at or below 54 mg/dL (3 mmol/L). Severe hypoglycemia is an emergency. Do not wait to see if the symptoms will go away. Get medical help right away. Call your local emergency services (911 in the [place] Do not drive yourself to the hospital. If you have severe hypoglycemia and you cannot eat or drink, you may need an injection of glucagon. A family member or close friend should learn how to check your blood glucose and how to give you a glucagon injection. Ask your health care provider if you need to have an emergency glucagon injection kit available. Severe hypoglycemia may need to be treated in a hospital. The treatment may include getting glucose through an IV tube. You may also need treatment for the cause of your hypoglycemia. Can having diabetes put me at risk for other conditions? Having diabetes can put you at risk for other long-term (chronic) conditions, such as heart disease and kidney disease. Your health care provider may prescribe medicines to help prevent complications from diabetes. These medicines may include:  Aspirin.  Medicine to lower cholesterol.  Medicine to control blood pressure.  What else can I do to manage my diabetes? Take your diabetes medicines as told  If your health care provider prescribed insulin or diabetes medicines, take them every day.  Do  not run out  of insulin or other diabetes medicines that you take. Plan ahead so you always have these available.  If you use insulin, adjust your dosage based on how physically active you are and what foods you eat. Your health care provider will tell you how to adjust your dosage. Make healthy food choices  The things that you eat and drink affect your blood glucose and your insulin dosage. Making good choices helps to control your diabetes and prevent other health problems. A healthy meal plan includes eating lean proteins, complex carbohydrates, fresh fruits and vegetables, low-fat dairy products, and healthy fats. Make an appointment to see a diet and nutrition specialist (registered dietitian) to help you create an eating plan that is right for you. Make sure that you:  Follow instructions from your health care provider about eating or drinking restrictions.  Drink enough fluid to keep your urine clear or pale yellow.  Eat healthy snacks between nutritious meals.  Track the carbohydrates that you eat. Do this by reading food labels and learning the standard serving sizes of foods.  Follow your sick day plan whenever you cannot eat or drink as usual. Make this plan in advance with your health care provider.  Stay active  Exercise regularly, as told by your health care provider. This may include:  Stretching and doing strength exercises, such as yoga or weightlifting, at least 2 times a week.  Doing at least 150 minutes of moderate-intensity or vigorous-intensity exercise each week. This could be brisk walking, biking, or water aerobics. ? Spread out your activity over at least 3 days of the week. ? Do not go more than 2 days in a row without doing some kind of physical activity.  When you start a new exercise or activity, work with your health care provider to adjust your insulin, medicines, or food intake as needed. Make healthy lifestyle choices  Do not use any tobacco products, such as  cigarettes, chewing tobacco, and e-cigarettes. If you need help quitting, ask your health care provider.  If your health care provider says that alcohol is safe for you, limit alcohol intake to no more than 1 drink per day for nonpregnant women and 2 drinks per day for men. One drink equals 12 oz of beer, 5 oz of wine, or 1 oz of hard liquor.  Learn to manage stress. If you need help with this, ask your health care provider. Care for your body   Keep your immunizations up to date. In addition to getting vaccinations as told by your health care provider, it is recommended that you get vaccinated against the following illnesses: ? The flu (influenza). Get a flu shot every year. ? Pneumonia. ? Hepatitis B.  Schedule an eye exam soon after your diagnosis, and then one time every year after that.  Check your skin and feet every day for cuts, bruises, redness, blisters, or sores. Schedule a foot exam with your health care provider once every year.  Brush your teeth and gums two times a day, and floss at least one time a day. Visit your dentist at least once every 6 months.  Maintain a healthy weight. General instructions  Take over-the-counter and prescription medicines only as told by your health care provider.  Share your diabetes management plan with people in your workplace, school, and household.  Check your urine for ketones when you are ill and as told by your health care provider.  Ask your health care provider: ? Do  I need to meet with a diabetes educator? ? Where can I find a support group for people with diabetes?  Carry a medical alert card or wear medical alert jewelry.  Keep all follow-up visits as told by your health care provider. This is important. Where to find more information: For more information about diabetes, visit:  American Diabetes Association (ADA): www.diabetes.org  American Association of Diabetes Educators (AADE):  www.diabeteseducator.org/patient-resources  This information is not intended to replace advice given to you by your health care provider. Make sure you discuss any questions you have with your health care provider. Document Released: 11/28/2015 Document Revised: 01/12/2016 Document Reviewed: 09/09/2015 Elsevier Interactive Patient Education  2017 [place].      Blood Glucose Monitoring, Adult Monitoring your blood sugar (glucose) helps you manage your diabetes. It also helps you and your health care provider determine how well your diabetes management plan is working. Blood glucose monitoring involves checking your blood glucose as often as directed, and keeping a record (log) of your results over time. Why should I monitor my blood glucose? Checking your blood glucose regularly can:  Help you understand how food, exercise, illnesses, and medicines affect your blood glucose.  Let you know what your blood glucose is at any time. You can quickly tell if you are having low blood glucose (hypoglycemia) or high blood glucose (hyperglycemia).  Help you and your health care provider adjust your medicines as needed.  When should I check my blood glucose? Follow instructions from your health care provider about how often to check your blood glucose.   This may depend on:  The type of diabetes you have.  How well-controlled your diabetes is.  Medicines you are taking.  If you have type 1 diabetes:  Check your blood glucose at least 2 times a day.  Also check your blood glucose: ? Before every insulin injection. ? Before and after exercise. ? Between meals. ? 2 hours after a meal. ? Occasionally between 2:00 a.m. and 3:00 a.m., as directed. ? Before potentially dangerous tasks, like driving or using heavy machinery. ? At bedtime.  You may need to check your blood glucose more often, up to 6-10 times a day: ? If you use an insulin pump. ? If you need multiple daily injections  (MDI). ? If your diabetes is not well-controlled. ? If you are ill. ? If you have a history of severe hypoglycemia. ? If you have a history of not knowing when your blood glucose is getting low (hypoglycemia unawareness).  If you have type 2 diabetes:  If you take insulin or other diabetes medicines, check your blood glucose at least 2 times a day.  If you are on intensive insulin therapy, check your blood glucose at least 4 times a day. Occasionally, you may also need to check between 2:00 a.m. and 3:00 a.m., as directed.  Also check your blood glucose: ? Before and after exercise. ? Before potentially dangerous tasks, like driving or using heavy machinery.  You may need to check your blood glucose more often if: ? Your medicine is being adjusted. ? Your diabetes is not well-controlled. ? You are ill.  What is a blood glucose log?  A blood glucose log is a record of your blood glucose readings. It helps you and your health care provider: ? Look for patterns in your blood glucose over time. ? Adjust your diabetes management plan as needed.  Every time you check your blood glucose, write down your result  and notes about things that may be affecting your blood glucose, such as your diet and exercise for the day.  Most glucose meters store a record of glucose readings in the meter. Some meters allow you to download your records to a computer. How do I check my blood glucose? Follow these steps to get accurate readings of your blood glucose: Supplies needed   Blood glucose meter.  Test strips for your meter. Each meter has its own strips. You must use the strips that come with your meter.  A needle to prick your finger (lancet). Do not use lancets more than once.  A device that holds the lancet (lancing device).  A journal or log book to write down your results.  Procedure  Wash your hands with soap and water.  Prick the side of your finger (not the tip) with the lancet.  Use a different finger each time.  Gently rub the finger until a small drop of blood appears.  Follow instructions that come with your meter for inserting the test strip, applying blood to the strip, and using your blood glucose meter.  Write down your result and any notes.  Alternative testing sites  Some meters allow you to use areas of your body other than your finger (alternative sites) to test your blood.  If you think you may have hypoglycemia, or if you have hypoglycemia unawareness, do not use alternative sites. Use your finger instead.  Alternative sites may not be as accurate as the fingers, because blood flow is slower in these areas. This means that the result you get may be delayed, and it may be different from the result that you would get from your finger.  The most common alternative sites are: ? Forearm. ? Thigh. ? Palm of the hand.  Additional tips  Always keep your supplies with you.  If you have questions or need help, all blood glucose meters have a 24-hour "hotline" number that you can call. You may also contact your health care provider.  After you use a few boxes of test strips, adjust (calibrate) your blood glucose meter by following instructions that came with your meter.  The American Diabetes Association suggests the following targets for most nonpregnant adults with diabetes.  More or less stringent glycemic goals may be appropriate for each individual.  A1C: Less than 7% A1C may also be reported as eAG: Less than 154 mg/dl Before a meal (preprandial plasma glucose): 80-130 mg/dl 1-2 hours after beginning of the meal (Postprandial plasma glucose)*: Less than 180 mg/dl  *Postprandial glucose may be targeted if A1C goals are not met despite reaching preprandial glucose goals.   This information is not intended to replace advice given to you by your health care provider. Make sure you discuss any questions you have with your health care  provider. Document Released: 08/09/2003 Document Revised: 02/24/2016 Document Reviewed: 01/16/2016 Elsevier Interactive Patient Education  2017 [employer].

## 2017-05-24 ENCOUNTER — Other Ambulatory Visit: Payer: Self-pay | Admitting: Adult Health

## 2017-05-24 ENCOUNTER — Other Ambulatory Visit: Payer: Self-pay | Admitting: Family Medicine

## 2017-05-27 ENCOUNTER — Other Ambulatory Visit: Payer: PPO

## 2017-05-27 DIAGNOSIS — I5032 Chronic diastolic (congestive) heart failure: Secondary | ICD-10-CM | POA: Diagnosis not present

## 2017-05-27 DIAGNOSIS — E1159 Type 2 diabetes mellitus with other circulatory complications: Secondary | ICD-10-CM

## 2017-05-27 DIAGNOSIS — I10 Essential (primary) hypertension: Secondary | ICD-10-CM | POA: Diagnosis not present

## 2017-05-27 DIAGNOSIS — E781 Pure hyperglyceridemia: Secondary | ICD-10-CM | POA: Diagnosis not present

## 2017-05-27 DIAGNOSIS — D696 Thrombocytopenia, unspecified: Secondary | ICD-10-CM

## 2017-05-27 DIAGNOSIS — E118 Type 2 diabetes mellitus with unspecified complications: Secondary | ICD-10-CM | POA: Diagnosis not present

## 2017-05-27 DIAGNOSIS — E559 Vitamin D deficiency, unspecified: Secondary | ICD-10-CM | POA: Diagnosis not present

## 2017-05-27 DIAGNOSIS — E782 Mixed hyperlipidemia: Secondary | ICD-10-CM

## 2017-05-27 DIAGNOSIS — IMO0001 Reserved for inherently not codable concepts without codable children: Secondary | ICD-10-CM

## 2017-05-27 DIAGNOSIS — J849 Interstitial pulmonary disease, unspecified: Secondary | ICD-10-CM | POA: Diagnosis not present

## 2017-05-27 DIAGNOSIS — E786 Lipoprotein deficiency: Secondary | ICD-10-CM | POA: Diagnosis not present

## 2017-05-27 DIAGNOSIS — E1069 Type 1 diabetes mellitus with other specified complication: Secondary | ICD-10-CM

## 2017-05-27 DIAGNOSIS — Z794 Long term (current) use of insulin: Principal | ICD-10-CM

## 2017-05-27 DIAGNOSIS — Z9189 Other specified personal risk factors, not elsewhere classified: Secondary | ICD-10-CM

## 2017-05-28 LAB — COMPREHENSIVE METABOLIC PANEL
ALT: 17 IU/L (ref 0–32)
AST: 25 IU/L (ref 0–40)
Albumin/Globulin Ratio: 1.1 — ABNORMAL LOW (ref 1.2–2.2)
Albumin: 3.4 g/dL — ABNORMAL LOW (ref 3.5–4.7)
Alkaline Phosphatase: 191 IU/L — ABNORMAL HIGH (ref 39–117)
BUN/Creatinine Ratio: 9 — ABNORMAL LOW (ref 12–28)
BUN: 8 mg/dL (ref 8–27)
Bilirubin Total: 0.5 mg/dL (ref 0.0–1.2)
CO2: 24 mmol/L (ref 20–29)
Calcium: 8.4 mg/dL — ABNORMAL LOW (ref 8.7–10.3)
Chloride: 103 mmol/L (ref 96–106)
Creatinine, Ser: 0.86 mg/dL (ref 0.57–1.00)
GFR calc Af Amer: 74 mL/min/{1.73_m2} (ref >59)
GFR calc non Af Amer: 64 mL/min/{1.73_m2} (ref >59)
Globulin, Total: 3.2 g/dL (ref 1.5–4.5)
Glucose: 217 mg/dL — ABNORMAL HIGH (ref 65–99)
Potassium: 4.2 mmol/L (ref 3.5–5.2)
Sodium: 137 mmol/L (ref 134–144)
Total Protein: 6.6 g/dL (ref 6.0–8.5)

## 2017-05-28 LAB — CBC WITH DIFFERENTIAL/PLATELET
Basophils Absolute: 0 10*3/uL (ref 0.0–0.2)
Basos: 1 %
EOS (ABSOLUTE): 0.1 10*3/uL (ref 0.0–0.4)
Eos: 3 %
Hematocrit: 38.2 % (ref 34.0–46.6)
Hemoglobin: 12.1 g/dL (ref 11.1–15.9)
Immature Grans (Abs): 0 10*3/uL (ref 0.0–0.1)
Immature Granulocytes: 0 %
Lymphocytes Absolute: 0.6 10*3/uL — ABNORMAL LOW (ref 0.7–3.1)
Lymphs: 24 %
MCH: 26 pg — ABNORMAL LOW (ref 26.6–33.0)
MCHC: 31.7 g/dL (ref 31.5–35.7)
MCV: 82 fL (ref 79–97)
Monocytes Absolute: 0.3 10*3/uL (ref 0.1–0.9)
Monocytes: 13 %
Neutrophils Absolute: 1.5 10*3/uL (ref 1.4–7.0)
Neutrophils: 59 %
Platelets: 61 10*3/uL — CL (ref 150–379)
RBC: 4.66 x10E6/uL (ref 3.77–5.28)
RDW: 16.8 % — ABNORMAL HIGH (ref 12.3–15.4)
WBC: 2.5 10*3/uL — CL (ref 3.4–10.8)

## 2017-05-28 LAB — LIPID PANEL
Chol/HDL Ratio: 4.3 ratio (ref 0.0–4.4)
Cholesterol, Total: 183 mg/dL (ref 100–199)
HDL: 43 mg/dL (ref >39)
LDL Calculated: 102 mg/dL — ABNORMAL HIGH (ref 0–99)
Triglycerides: 188 mg/dL — ABNORMAL HIGH (ref 0–149)
VLDL Cholesterol Cal: 38 mg/dL (ref 5–40)

## 2017-05-28 LAB — VITAMIN D 25 HYDROXY (VIT D DEFICIENCY, FRACTURES): Vit D, 25-Hydroxy: 27.5 ng/mL — ABNORMAL LOW (ref 30.0–100.0)

## 2017-05-28 LAB — T4, FREE: Free T4: 1.2 ng/dL (ref 0.82–1.77)

## 2017-05-28 LAB — HEMOGLOBIN A1C
Est. average glucose Bld gHb Est-mCnc: 217 mg/dL
Hgb A1c MFr Bld: 9.2 % — ABNORMAL HIGH (ref 4.8–5.6)

## 2017-05-28 LAB — TSH: TSH: 1.07 u[IU]/mL (ref 0.450–4.500)

## 2017-05-29 DIAGNOSIS — G4733 Obstructive sleep apnea (adult) (pediatric): Secondary | ICD-10-CM | POA: Diagnosis not present

## 2017-05-30 ENCOUNTER — Other Ambulatory Visit: Payer: PPO

## 2017-06-05 ENCOUNTER — Ambulatory Visit (INDEPENDENT_AMBULATORY_CARE_PROVIDER_SITE_OTHER): Payer: PPO | Admitting: Family Medicine

## 2017-06-05 ENCOUNTER — Encounter: Payer: Self-pay | Admitting: Family Medicine

## 2017-06-05 ENCOUNTER — Telehealth: Payer: Self-pay | Admitting: Family Medicine

## 2017-06-05 VITALS — BP 116/61 | HR 106 | Ht 60.0 in | Wt 190.0 lb

## 2017-06-05 DIAGNOSIS — E1159 Type 2 diabetes mellitus with other circulatory complications: Secondary | ICD-10-CM | POA: Diagnosis not present

## 2017-06-05 DIAGNOSIS — D72819 Decreased white blood cell count, unspecified: Secondary | ICD-10-CM

## 2017-06-05 DIAGNOSIS — E559 Vitamin D deficiency, unspecified: Secondary | ICD-10-CM | POA: Diagnosis not present

## 2017-06-05 DIAGNOSIS — I5032 Chronic diastolic (congestive) heart failure: Secondary | ICD-10-CM | POA: Diagnosis not present

## 2017-06-05 DIAGNOSIS — R7989 Other specified abnormal findings of blood chemistry: Secondary | ICD-10-CM | POA: Diagnosis not present

## 2017-06-05 DIAGNOSIS — E1069 Type 1 diabetes mellitus with other specified complication: Secondary | ICD-10-CM | POA: Diagnosis not present

## 2017-06-05 DIAGNOSIS — D696 Thrombocytopenia, unspecified: Secondary | ICD-10-CM | POA: Diagnosis not present

## 2017-06-05 DIAGNOSIS — E039 Hypothyroidism, unspecified: Secondary | ICD-10-CM

## 2017-06-05 DIAGNOSIS — I708 Atherosclerosis of other arteries: Secondary | ICD-10-CM

## 2017-06-05 DIAGNOSIS — I7090 Unspecified atherosclerosis: Secondary | ICD-10-CM

## 2017-06-05 DIAGNOSIS — R748 Abnormal levels of other serum enzymes: Secondary | ICD-10-CM | POA: Diagnosis not present

## 2017-06-05 DIAGNOSIS — I10 Essential (primary) hypertension: Secondary | ICD-10-CM

## 2017-06-05 DIAGNOSIS — I2510 Atherosclerotic heart disease of native coronary artery without angina pectoris: Secondary | ICD-10-CM | POA: Diagnosis not present

## 2017-06-05 DIAGNOSIS — I2583 Coronary atherosclerosis due to lipid rich plaque: Secondary | ICD-10-CM

## 2017-06-05 DIAGNOSIS — E782 Mixed hyperlipidemia: Secondary | ICD-10-CM

## 2017-06-05 MED ORDER — VITAMIN D (ERGOCALCIFEROL) 1.25 MG (50000 UNIT) PO CAPS: 50000.0000 [IU] | ORAL_CAPSULE | ORAL | 3 refills | Status: DC

## 2017-06-05 MED ORDER — CALCIUM CARBONATE-VITAMIN D 600-400 MG-UNIT PO TABS: ORAL_TABLET | ORAL | 11 refills | Status: DC

## 2017-06-05 MED ORDER — SIMVASTATIN 20 MG PO TABS: 10.0000 mg | ORAL_TABLET | Freq: Every day | ORAL | 1 refills | Status: DC

## 2017-06-05 MED ORDER — VITAMIN D3 125 MCG (5000 UT) PO TABS: ORAL_TABLET | ORAL | 3 refills | Status: DC

## 2017-06-05 NOTE — Assessment & Plan Note (Signed)
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  - If in the future her white blood cell count continues to go lower, we will send her to hematology for evaluation.

## 2017-06-05 NOTE — Telephone Encounter (Signed)
Wendy @ [facility] called to speak w/ provider or nurse that would clarify if Vit- D dosage is correct ( 50,000 units (1 tablet) 2 times weekly.  ------Pt's prior Rx was One tablet once weekly---  Please call Abigail Butts at [facility]

## 2017-06-05 NOTE — Patient Instructions (Addendum)
CBC in one month to recheck your white blood cell and platelet count; also ALT since inc in statin med.   Please make a f/up with Dr Talmage Nap as you are instructed by her.  This should be done every 3-4 months until your A1c is at goal.  Please also asked her about the alkaline phosphatase elevated levels and see if she has concerns about that    Please make an appointment to see your dermatologist, Dr. Benna Dunks ( [place]) in the next few days.   - If you can't get in to see your dermatologist above please try one of the providers at [facility]:  Dr Elpidio Galea, PA-C,  at [facility].  [facility] [address] [place], [place] [address] [phone]   Guidelines for a Low Cholesterol, Low Saturated Fat Diet   Fats - Limit total intake of fats and oils. - Avoid butter, stick margarine, shortening, lard, palm and coconut oils. - Limit mayonnaise, salad dressings, gravies and sauces, unless they are homemade with low-fat ingredients. - Limit chocolate. - Choose low-fat and nonfat products, such as low-fat mayonnaise, low-fat or non-hydrogenated peanut butter, low-fat or fat-free salad dressings and nonfat gravy. - Use vegetable oil, such as canola or olive oil. - Look for margarine that does not contain trans fatty acids. - Use nuts in moderate amounts. - Read ingredient labels carefully to determine both amount and type of fat present in foods. Limit saturated and trans fats! - Avoid high-fat processed and convenience foods.  Meats and Meat Alternatives - Choose fish, chicken, [place] and lean meats. - Use dried beans, peas, lentils and tofu. - Limit egg yolks to three to four per week. - If you eat red meat, limit to no more than three servings per week and choose loin or round cuts. - Avoid fatty meats, such as bacon, sausage, franks, luncheon meats and ribs. - Avoid all organ meats, including liver.  Dairy - Choose nonfat or low-fat milk,  yogurt and cottage cheese. - Most cheeses are high in fat. Choose cheeses made from non-fat milk, such as mozzarella and ricotta cheese. - Choose light or fat-free cream cheese and sour cream. - Avoid cream and sauces made with cream.  Fruits and Vegetables - Eat a wide variety of fruits and vegetables. - Use lemon juice, vinegar or "mist" olive oil on vegetables. - Avoid adding sauces, fat or oil to vegetables.  Breads, Cereals and Grains - Choose whole-grain breads, cereals, pastas and rice. - Avoid high-fat snack foods, such as granola, cookies, pies, pastries, doughnuts and croissants.  Cooking Tips - Avoid deep fried foods. - Trim visible fat off meats and remove skin from poultry before cooking. - Bake, broil, boil, poach or roast poultry, fish and lean meats. - Drain and discard fat that drains out of meat as you cook it. - Add little or no fat to foods. - Use vegetable oil sprays to grease pans for cooking or baking. - Steam vegetables. - Use herbs or no-oil marinades to flavor foods.    Nine ways to increase your "good" HDL cholesterol  High-density lipoprotein (HDL) is often referred to as the "good" cholesterol. Having high HDL levels helps carry cholesterol from your arteries to your liver, where it can be used or excreted.  Having high levels of HDL also has antioxidant and anti-inflammatory effects, and is linked to a reduced risk of heart disease (1, 2).  Most health experts recommend minimum blood levels of 40 mg/dl in  men and 50 mg/dl in women.  While genetics definitely play a role, there are several other factors that affect HDL levels.  Here are nine healthy ways to raise your "good" HDL cholesterol.  1. Consume olive oil  two pieces of salmon on a plate olive oil being poured into a small dish Extra virgin olive oil may be more healthful than processed olive oils. Olive oil is one of the healthiest fats around.  A large analysis of 42 studies with  more than 800,000 participants found that olive oil was the only source of monounsaturated fat that seemed to reduce heart disease risk (3).  Research has shown that one of olive oil's heart-healthy effects is an increase in HDL cholesterol. This effect is thought to be caused by antioxidants it contains called polyphenols (4, 5, 6, 7).  Extra virgin olive oil has more polyphenols than more processed olive oils, although the amount can still vary among different types and brands.  One study gave 200 healthy young men about 2 tablespoons (25 ml) of different olive oils per day for three weeks.  The researchers found that participants' HDL levels increased significantly more after they consumed the olive oil with the highest polyphenol content (6).  In another study, when 4962 older adults consumed about 4 tablespoons (50 ml) of high-polyphenol extra virgin olive oil every day for six weeks, their HDL cholesterol increased by 6.5 mg/dl, on average (7).  In addition to raising HDL levels, olive oil has been found to boost HDL's anti-inflammatory and antioxidant function in studies of older people and individuals with high cholesterol levels ( 7, 8, 9).  Whenever possible, select high-quality, certified extra virgin olive oils, which tend to be highest in polyphenols.  Bottom line: Extra virgin olive oil with a high polyphenol content has been shown to increase HDL levels in healthy people, the elderly and individuals with high cholesterol.  2. Follow a low-carb or ketogenic diet  Low-carb and ketogenic diets provide a number of health benefits, including weight loss and reduced blood sugar levels.  They have also been shown to increase HDL cholesterol in people who tend to have lower levels.  This includes those who are obese, insulin resistant or diabetic (10, 11, 12, 13, 14, 15, 16, 17).  In one study, people with type 2 diabetes were split into two groups.  One followed a diet consuming  less than 50 grams of carbs per day. The other followed a high-carb diet.  Although both groups lost weight, the low-carb group's HDL cholesterol increased almost twice as much as the high-carb group's did (14).  In another study, obese people who followed a low-carb diet experienced an increase in HDL cholesterol of 5 mg/dl overall.  Meanwhile, in the same study, the participants who ate a low-fat, high-carb diet showed a decrease in HDL cholesterol (15).  This response may partially be due to the higher levels of fat people typically consume on low-carb diets.  One study in overweight women found that diets high in meat and cheese increased HDL levels by 5-8%, compared to a higher-carb diet (18).  What's more, in addition to raising HDL cholesterol, very-low-carb diets have been shown to decrease triglycerides and improve several other risk factors for heart disease (13, 14, 16, 17).  Bottom line: Low-carb and ketogenic diets typically increase HDL cholesterol levels in people with diabetes, metabolic syndrome and obesity.  3. Exercise regularly  Being physically active is important for heart health.  Studies have shown  that many different types of exercise are effective at raising HDL cholesterol, including strength training, high-intensity exercise and aerobic exercise (19, 20, 21, 22, 23, 24).  However, the biggest increases in HDL are typically seen with high-intensity exercise.  One small study followed women who were living with polycystic ovary syndrome (PCOS), which is linked to a higher risk of insulin resistance. The study required them to perform high-intensity exercise three times a week.  The exercise led to an increase in HDL cholesterol of 8 mg/dL after 10 weeks. The women also showed improvements in other health markers, including decreased insulin resistance and improved arterial function (23).  In a 12-week study, overweight men who performed high-intensity exercise  experienced a 10% increase in HDL cholesterol.  In contrast, the low-intensity exercise group showed only a 2% increase and the endurance training group experienced no change (24).  However, even lower-intensity exercise seems to increase HDL's anti-inflammatory and antioxidant capacities, whether or not HDL levels change (20, 21, 25).  Overall, high-intensity exercise such as high-intensity interval training (HIIT) and high-intensity circuit training (HICT) may boost HDL cholesterol levels the most.  Bottom line: Exercising several times per week can help raise HDL cholesterol and enhance its anti-inflammatory and antioxidant effects. High-intensity forms of exercise may be especially effective.  4. Add coconut oil to your diet  Studies have shown that coconut oil may reduce appetite, increase metabolic rate and help protect brain health, among other benefits.  Some people may be concerned about coconut oil's effects on heart health due to its high saturated fat content.  However, it appears that coconut oil is actually quite heart healthy.  Coconut oil tends to raise HDL cholesterol more than many other types of fat.  In addition, it may improve the ratio of low-density-lipoprotein (LDL) cholesterol, the "bad" cholesterol, to HDL cholesterol. Improving this ratio reduces heart disease risk (26, 27, 28, 29).  One study examined the health effects of coconut oil on 40 women with excess belly fat. The researchers found that participants who took coconut oil daily experienced increased HDL cholesterol and a lower LDL-to-HDL ratio.  In contrast, the group who took soybean oil daily had a decrease in HDL cholesterol and an increase in the LDL-to-HDL ratio (29).  Most studies have found these health benefits occur at a dosage of about 2 tablespoons (30 ml) of coconut oil per day. It's best to incorporate this into cooking rather than eating spoonfuls of coconut oil on their own.  Bottom line:  Consuming 2 tablespoons (30 ml) of coconut oil per day may help increase HDL cholesterol levels.  5. Stop smoking  cigarette butt Quitting smoking can reduce the risk of heart disease and lung cancer. Smoking increases the risk of many health problems, including heart disease and lung cancer (30).  One of its negative effects is a suppression of HDL cholesterol.  Some studies have found that quitting smoking can increase HDL levels. Indeed, one study found no significant differences in HDL levels between former smokers and people who had never smoked (31, 32, 33, 34, 35).  In a one-year study of more than 1,500 people, those who quit smoking had twice the increase in HDL as those who resumed smoking within the year. The number of large HDL particles also increased, which further reduced heart disease risk (32).  One study followed smokers who switched from traditional cigarettes to electronic cigarettes for one year. They found that the switch was associated with an increase in HDL cholesterol  of 5 mg/dl, on average (33).  When it comes to the effect of nicotine replacement patches on HDL levels, research results have been mixed.  One study found that nicotine replacement therapy led to higher HDL cholesterol. However, other research suggests that people who use nicotine patches likely won't see increases in HDL levels until after replacement therapy is completed (34, 36).  Even in studies where HDL cholesterol levels didn't increase after people quit smoking, HDL function improved, resulting in less inflammation and other beneficial effects on heart health (37).  Bottom line: Quitting smoking can increase HDL levels, improve HDL function and help protect heart health.  6. Lose weight  When overweight and obese people lose weight, their HDL cholesterol levels usually increase.  What's more, this benefit seems to occur whether weight loss is achieved by calorie counting, carb restriction,  intermittent fasting, weight loss surgery or a combination of diet and exercise (16, 38, 39, 40, 41, 42).  One study examined HDL levels in more than 3,000 overweight and obese [place] adults who followed a lifestyle modification program for one year.  The researchers found that losing at least 6.6 lbs (3 kg) led to an increase in HDL cholesterol of 4 mg/dl, on average (41).  In another study, when obese people with type 2 diabetes consumed calorie-restricted diets that provided 20-30% of calories from protein, they experienced significant increases in HDL cholesterol levels (42).  The key to achieving and maintaining healthy HDL cholesterol levels is choosing the type of diet that makes it easiest for you to lose weight and keep it off.  Bottom Line: Several methods of weight loss have been shown to increase HDL cholesterol levels in people who are overweight or obese.  7. Choose purple produce  Consuming purple-colored fruits and vegetables is a delicious way to potentially increase HDL cholesterol.  Purple produce contains antioxidants known as anthocyanins.  Studies using anthocyanin extracts have shown that they help fight inflammation, protect your cells from damaging free radicals and may also raise HDL cholesterol levels (43, 44, 45, 46).  In a 24-week study of 58 people with diabetes, those who took an anthocyanin supplement twice a day experienced a 19% increase in HDL cholesterol, on average, along with other improvements in heart health markers (45).  In another study, when people with cholesterol issues took anthocyanin extract for 12 weeks, their HDL cholesterol levels increased by 13.7% (46).  Although these studies used extracts instead of foods, there are several fruits and vegetables that are very high in anthocyanins. These include eggplant, purple corn, red cabbage, blueberries, blackberries and black raspberries.  Bottom line: Consuming fruits and vegetables rich in  anthocyanins may help increase HDL cholesterol levels.  8. Eat fatty fish often  The omega-3 fats in fatty fish provide major benefits to heart health, including a reduction in inflammation and better functioning of the cells that line your arteries (47, 48).  There's some research showing that eating fatty fish or taking fish oil may also help raise low levels of HDL cholesterol (49, 50, 51, 52, 53).  In a study of 33 heart disease patients, participants that consumed fatty fish four times per week experienced an increase in HDL cholesterol levels. The particle size of their HDL also increased (52).  In another study, overweight men who consumed herring five days a week for six weeks had a 5% increase in HDL cholesterol, compared with their levels after eating lean pork and chicken five days a week (53).  However, there are a few studies that found no increase in HDL cholesterol in response to increased fish or omega-3 supplement intake (54, 55).  In addition to herring, other types of fatty fish that may help raise HDL cholesterol include salmon, sardines, mackerel and anchovies.  Bottom line: Eating fatty fish several times per week may help increase HDL cholesterol levels and provide other benefits to heart health.  9. Avoid artificial trans fats  Artificial trans fats have many negative health effects due to their inflammatory properties (56, 57).  There are two types of trans fats. One kind occurs naturally in animal products, including full-fat dairy.  In contrast, the artificial trans fats found in margarines and processed foods are created by adding hydrogen to unsaturated vegetable and seed oils. These fats are also known as industrial trans fats or partially hydrogenated fats.  Research has shown that, in addition to increasing inflammation and contributing to several health problems, these artificial trans fats may lower HDL cholesterol levels.  In one study, researchers  compared how people's HDL levels responded when they consumed different margarines.  The study found that participants' HDL cholesterol levels were 10% lower after consuming margarine containing partially hydrogenated soybean oil, compared to their levels after consuming palm oil (58).  Another controlled study followed 40 adults who had diets high in different types of trans fats.  They found that HDL cholesterol levels in women were significantly lower after they consumed the diet high in industrial trans fats, compared to the diet containing naturally occurring trans fats (59).  To protect heart health and keep HDL cholesterol in the healthy range, it's best to avoid artificial trans fats altogether.  Bottom line: Artificial trans fats have been shown to lower HDL levels and increase inflammation, compared to other fats.  Take home message  Although your HDL cholesterol levels are partly determined by your genetics, there are many things you can do to naturally increase your own levels.  Fortunately, the practices that raise HDL cholesterol often provide other health benefits as well.

## 2017-06-05 NOTE — Progress Notes (Signed)
Assessment and plan:  1. Leukopenia, unspecified type- 3.7, 3.2, 2.5 most recent   2. Vitamin D deficiency   3. Chronic leukopenia   4. Hypocalcemia   5. chronic Low platelet count   6. Elevated serum alkaline phosphatase level- hepatocellular in origin   7. Thrombocytopenia, unspecified (HCC)   8. Mixed diabetic hyperlipidemia associated with type 1 diabetes mellitus (HCC)   9. Hypertension associated with diabetes (HCC)   10. Hypothyroidism, unspecified type   11. Coronary artery disease due to lipid rich plaque   12. Heart failure, diastolic, chronic (HCC)   13. Atherosclerosis of carotid arteries    chronic Leukopenia We will recheck her CBC in one month or so, if white blood cells continued to be low, this is a new phenomenon when the past 1 year as only in the past, she's been evaluated by hematology for her low platelets which have been a problem for her for 10+ years.  - If in the future her white blood cell count continues to go lower, we will send her to hematology for evaluation.  Mixed diabetic hyperlipidemia associated with type 1 diabetes mellitus (HCC)  Having diabetes along with atherosclerotic cardiovascular disease, her LDL was 102 today, we will increase her Zocor from 10 mg nightly to 20 - Told patient goal LDL is less than 70.  One year ago it was 7556. - Discussed with patient that not only is it medication but also what she eats and how much she moves.  Educational handouts provided. .  Recheck ALT in 4-6 weeks, cholesterol panel in 4 months.    Meds ordered this encounter  Medications  . DISCONTD: Cholecalciferol (VITAMIN D3) 5000 units TABS    Sig: 5,000 IU OTC vitamin D3 daily.  ( In addition to your once weekly prescription vitamin D)    Dispense:  90 tablet    Refill:  3  . Calcium Carbonate-Vitamin D 600-400 MG-UNIT tablet    Sig: Please get a calcium supplement tablet  over-the-counter to equal 1200 mg of calcium per day    Dispense:  60 tablet    Refill:  11  . DISCONTD: simvastatin (ZOCOR) 20 MG tablet    Sig: Take 0.5 tablets (10 mg total) by mouth at bedtime.    Dispense:  90 tablet    Refill:  1  . DISCONTD: Vitamin D, Ergocalciferol, (DRISDOL) 50000 units CAPS capsule    Sig: Take 1 capsule (50,000 Units total) by mouth 2 (two) times a week.    Dispense:  24 capsule    Refill:  3    Discontinued Medications   CHOLECALCIFEROL (VITAMIN D3) 5000 UNITS TABS    5,000 IU OTC vitamin D3 daily.   FUROSEMIDE (LASIX) 40 MG TABLET    Take 0.5 tablets (20 mg total) by mouth daily.   INSULIN ASPART (NOVOLOG FLEXPEN) 100 UNIT/ML FLEXPEN    INJECT 20 UNITS SUBCUTANEOUSLY THREE TIMES A DAY BEFORE MEALS; ADJUST AND INCREASE ACCORDINGLY.  MUST HAVE OFFICE VISIT PRIOR TO ANY FURTHER REFILLS.   LANTUS SOLOSTAR 100 UNIT/ML SOLOSTAR PEN    INJECT 64 UNITS SUBCUTANEOUSLY EVERY NIGHT AT BEDTIME AND 30 UNITS 12 HOURS LATER.   LOSARTAN (COZAAR) 25 MG TABLET    Take 1 tablet (25 mg total) by mouth daily.   PANTOPRAZOLE (PROTONIX) 40 MG TABLET    Take 1 tablet (40 mg total) by mouth 2 (two) times daily before a meal.   PROAIR HFA 108 (90 BASE)  MCG/ACT INHALER    INHALE 2 PUFFS BY MOUTH EVERY 6 HOURS AS NEEDED - PT MUST HAVE OFFICE VISIT FOR MORE REFILLS   SIMVASTATIN (ZOCOR) 10 MG TABLET    Take 1 tablet by mouth daily.   SIMVASTATIN (ZOCOR) 20 MG TABLET    Take 0.5 tablets (10 mg total) by mouth at bedtime.   VITAMIN D, ERGOCALCIFEROL, (DRISDOL) 50000 UNITS CAPS CAPSULE    Take 1 capsule (50,000 Units total) by mouth every 7 (seven) days.   VITAMIN D, ERGOCALCIFEROL, (DRISDOL) 50000 UNITS CAPS CAPSULE    Take 1 capsule (50,000 Units total) by mouth 2 (two) times a week.    Modified Medications   Modified Medication Previous Medication   CARVEDILOL (COREG) 3.125 MG TABLET carvedilol (COREG) 3.125 MG tablet      TAKE ONE TABLET BY MOUTH TWO TIMES DAILY WITH A MEAL.    TAKE  ONE TABLET BY MOUTH TWO TIMES DAILY WITH A MEAL.   EASY TOUCH PEN NEEDLES 31G X 5 MM MISC EASY TOUCH PEN NEEDLES 31G X 5 MM MISC      USE TO INJECT INSULIN 5 TIMES A DAY    USE TO INJECT INSULIN 5 TIMES A DAY   ESCITALOPRAM (LEXAPRO) 20 MG TABLET escitalopram (LEXAPRO) 20 MG tablet      Take 1 tablet (20 mg total) by mouth daily.    Take 1 tablet (20 mg total) by mouth daily.   FLUTICASONE-SALMETEROL (ADVAIR DISKUS) 250-50 MCG/DOSE AEPB Fluticasone-Salmeterol (ADVAIR DISKUS) 250-50 MCG/DOSE AEPB      TAKE 1 INHALATION BY MOUTH TWICE DAILY    TAKE 1 INHALATION BY MOUTH TWICE DAILY   FUROSEMIDE (LASIX) 40 MG TABLET furosemide (LASIX) 40 MG tablet      TAKE 1/2 A TABLET (20 MG TOTAL) BY MOUTH DAILY.    Take 0.5 tablets (20 mg total) by mouth daily.   INSULIN GLARGINE (LANTUS SOLOSTAR) 100 UNIT/ML SOLOSTAR PEN Insulin Glargine (LANTUS SOLOSTAR) 100 UNIT/ML Solostar Pen      INJECT 64 UNITS SUBCUTANEOUSLY EVERY NIGHT AT BEDTIME AND 30 UNITS 12 HOURS LATER.    INJECT 64 UNITS SUBCUTANEOUSLY EVERY NIGHT AT BEDTIME AND 30 UNITS 12 HOURS LATER.   LEVOTHYROXINE (SYNTHROID, LEVOTHROID) 112 MCG TABLET levothyroxine (SYNTHROID, LEVOTHROID) 112 MCG tablet      TAKE 1 TABLET EVERY MORNING ON AN EMPTY STOMACH ONCE A DAY    TAKE 1 TABLET EVERY MORNING ON AN EMPTY STOMACH ONCE A DAY   MONTELUKAST (SINGULAIR) 10 MG TABLET montelukast (SINGULAIR) 10 MG tablet      TAKE 1 TABLET BY MOUTH EVERY EVENING.    Take 1 tablet (10 mg total) by mouth every evening.   NITROGLYCERIN (NITROSTAT) 0.4 MG SL TABLET nitroGLYCERIN (NITROSTAT) 0.4 MG SL tablet      Place 1 tablet under the tongue every 5 minutes as needed for chest pain. Patient needs an appointment for further refills 1st attempt    Place 1 tablet (0.4 mg total) under the tongue every 5 (five) minutes as needed. Chest pain   NOVOLOG FLEXPEN 100 UNIT/ML FLEXPEN NOVOLOG FLEXPEN 100 UNIT/ML FlexPen      INJECT 20 UNITS SUBCUTANEOUSLY THREE TIMES A DAY BEFORE MEALS; ADJUST  AND INCREASE ACCORDINGLY.    INJECT 20 UNITS SUBCUTANEOUSLY THREE TIMES A DAY BEFORE MEALS; ADJUST AND INCREASE ACCORDINGLY.   ONETOUCH DELICA LANCETS 33G MISC ONETOUCH DELICA LANCETS 33G MISC      USE TO CHECK BLOOD SUGAR THREE TIMES A DAY    USE  TO CHECK BLOOD SUGAR THREE TIMES A DAY   PANTOPRAZOLE (PROTONIX) 20 MG TABLET pantoprazole (PROTONIX) 20 MG tablet      TAKE ONE TABLET BY MOUTH TWICE DAILY BEFORE A MEAL    TAKE 1 TABLET BY MOUTH TWICE DAILY BEFORE A MEAL   POTASSIUM CHLORIDE (K-DUR,KLOR-CON) 10 MEQ TABLET potassium chloride (K-DUR,KLOR-CON) 10 MEQ tablet      TAKE ONE TABLET BY MOUTH FOUR TIMES DAILY    TAKE ONE TABLET BY MOUTH FOUR TIMES DAILY   PROAIR HFA 108 (90 BASE) MCG/ACT INHALER PROAIR HFA 108 (90 Base) MCG/ACT inhaler      INHALE 2 PUFFS BY MOUTH EVERY 6 HOURS AS NEEDED - PT MUST HAVE OFFICE VISIT FOR MORE REFILLS    INHALE 2 PUFFS BY MOUTH EVERY 6 HOURS AS NEEDED - PT MUST HAVE OFFICE VISIT FOR MORE REFILLS   TIOTROPIUM (SPIRIVA HANDIHALER) 18 MCG INHALATION CAPSULE tiotropium (SPIRIVA HANDIHALER) 18 MCG inhalation capsule      INHALE THE CONTENTS OF 1 CAPSULE VIA HANDIHALER BY MOUTH EVERY DAY    INHALE THE CONTENTS OF 1 CAPSULE VIA HANDIHALER BY MOUTH EVERY DAY   VITAMIN D, ERGOCALCIFEROL, (DRISDOL) 50000 UNITS CAPS CAPSULE Vitamin D, Ergocalciferol, (DRISDOL) 50000 units CAPS capsule      TAKE 1 CAPSULE (50,000 UNITS TOTAL) BY MOUTH EVERY 7 (SEVEN) DAYS.    Take 1 capsule (50,000 Units total) by mouth every 7 (seven) days.     No orders of the defined types were placed in this encounter.    Return for 4-6wks OV w/me -->reck after inc statin- repeat cbc, ALT;  BS log and BP log.  Anticipatory guidance and routine counseling done re: condition, txmnt options and need for follow up. All questions of patient's were answered.   Gross side effects, risk and benefits, and alternatives of medications discussed with patient.  Patient is aware that all medications have potential  side effects and we are unable to predict every sideeffect or drug-drug interaction that may occur.  Expresses verbal understanding and consents to current therapy plan and treatment regiment.  Please see AVS handed out to patient at the end of our visit for additional patient instructions/ counseling done pertaining to today's office visit.  Note: This document was prepared using Dragon voice recognition software and may include unintentional dictation errors.   ----------------------------------------------------------------------------------------------------------------------  Subjective:   CC:   [name] is a 83 y.o. female who presents to [facility] today for review and discussion of recent bloodwork that was done.  1. All recent blood work that we ordered was reviewed with patient today.  Patient was counseled on all abnormalities and we discussed dietary and lifestyle changes that could help those values (also medications when appropriate).  Extensive health counseling performed and all patient's concerns/ questions were addressed.   2. Dr Talmage Nap- mgts pt DM.   She has not been able to reach her yet.  FBS 109 this am.  Been in the 100's.   Because she has been eating alot better lately.   Less sweets.     3. Started walking--> 2 d/wk.   Walked for .     Wt Readings from Last 3 Encounters:  12/04/17 193 lb 8 oz (87.8 kg)  07/24/17 188 lb (85.3 kg)  06/05/17 190 lb (86.2 kg)   BP Readings from Last 3 Encounters:  12/04/17 126/70  07/24/17 (!) 121/55  06/05/17 116/61   Pulse Readings from Last 3  Encounters:  12/04/17 70  07/24/17 73  06/05/17 (!) 106   BMI Readings from Last 3 Encounters:  12/04/17 37.79 kg/m  07/24/17 36.72 kg/m  06/05/17 37.11 kg/m     Patient Care Team    Relationship Specialty Notifications Start End  Thomasene Lot, DO PCP - General Family Medicine  03/12/16   Kalman Shan, MD Attending Physician  Pulmonary Disease Abnormal results only, Admissions 12/07/12   Marcellus Scott, MD Attending Physician Pulmonary Disease  05/01/16   Nahser, Deloris Ping, MD Consulting Physician Cardiology  05/01/16   Lynann Bologna, MD Referring Physician Gastroenterology  05/01/16   Dorisann Frames, MD Consulting Physician Endocrinology  05/22/17     Full medical history updated and reviewed in the office today  Patient Active Problem List   Diagnosis Date Noted  . Hypertension associated with diabetes (HCC) 05/22/2017    Priority: High  . Obesity, Class I, BMI 30-34.9 06/23/2016    Priority: High  . Hypothyroidism 03/13/2016    Priority: High  . Insulin dependent diabetes mellitus with complications (HCC) 03/13/2016    Priority: High  . 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 Depression 03/13/2016    Priority: Medium  . Coronary artery disease 04/11/2011    Priority: Medium  . COPD (chronic obstructive pulmonary disease) (HCC) 04/11/2011    Priority: Medium  . Obstructive sleep apnea 04/11/2011    Priority: Medium  . Heart failure, diastolic, chronic (HCC) 03/29/2008    Priority: Medium  . Vitamin D deficiency 05/11/2016    Priority: Low  . Atherosclerosis of carotid arteries 04/03/2006    Priority: Low  . Gastroesophageal reflux disease 12/31/2017  . chronic Low platelet count 06/05/2017  . chronic Leukopenia 05/11/2016  . Elevated serum alkaline phosphatase level- hepatocellular in origin 05/11/2016  . Constipation, chronic 03/13/2016  . At high risk for osteoporosis 03/13/2016  . Hypotension 12/13/2012  . Hypokalemia 12/13/2012  . Thrombocytopenia, unspecified (HCC) 12/13/2012  . h/o Clostridium difficile colitis 12/12/2012  . History of GI bleed 12/12/2012  . ILD (interstitial lung disease) (HCC) 11/25/2012    Past Medical History:  Diagnosis Date  . Cirrhosis, non-alcoholic (HCC)   . Coronary artery disease     status post inferior wall myocardial infarction  . Diabetes mellitus   . Dyslipidemia   . Hypotension 08/02/2010   recent episodes of orthostatic hypotension  . Hypothyroidism   . Leg edema   . Myocardial infarct, old   . Neuropathy of hand    left hand numbness/neuropathy  . Obesity   . Sleep apnea     Past Surgical History:  Procedure Laterality Date  . ABDOMINAL HYSTERECTOMY    . CARDIAC CATHETERIZATION     The ejection fraction is around 50%  . CHOLECYSTECTOMY    . CORONARY STENT PLACEMENT    . ESOPHAGOGASTRODUODENOSCOPY N/A 12/12/2012   Procedure: ESOPHAGOGASTRODUODENOSCOPY (EGD);  Surgeon: Florencia Reasons, MD;  Location: [facility];  Service: Endoscopy;  Laterality: N/A;  . FLEXIBLE SIGMOIDOSCOPY N/A 12/12/2012   Procedure: FLEXIBLE SIGMOIDOSCOPY;  Surgeon: Florencia Reasons, MD;  Location: [facility];  Service: Endoscopy;  Laterality: N/A;  . TUBAL LIGATION    . WRIST SURGERY      Social History   Tobacco Use  . Smoking status: Former Smoker    Last attempt to quit: 08/20/1973    Years since quitting: 44.4  . Smokeless tobacco: Never Used  Substance Use Topics  . Alcohol  use: No    Family Hx: Family History  Problem Relation Age of Onset  . Emphysema Brother        non smoker  . Diabetes Brother   . Hyperlipidemia Brother   . Hypertension Brother   . Heart disease Father   . Heart attack Father   . Diabetes Father   . Hyperlipidemia Father   . Hypertension Father   . Diabetes Mother   . Diabetes Sister   . Hyperlipidemia Sister   . Hypertension Sister   . Heart attack Daughter   . Diabetes Daughter   . Diabetes Son   . Suicidality Son   . Alcohol abuse Son   . Alcohol abuse Maternal Uncle   . Heart attack Paternal Grandfather   . Diabetes Daughter   . Cancer Son        melanoma  . Diabetes Son   . Diabetes Son      Medications: Current Outpatient Medications  Medication Sig Dispense Refill  . Blood Glucose Monitoring Suppl (FREESTYLE  LITE) DEVI Use to check blood sugars 2-3 times daily 1 each 0  . cycloSPORINE (RESTASIS) 0.05 % ophthalmic emulsion Place 1 drop into both eyes 2 (two) times daily.    Marland Kitchen glucose blood (FREESTYLE LITE) test strip Test 3 to 4 times daily 120 each 12  . ONE TOUCH ULTRA TEST test strip TEST BLOOD SUGAR 2-3 TIMES A DAY 100 each 11  . potassium chloride (KLOR-CON) 10 MEQ CR tablet Take 10 mEq by mouth 2 (two) times daily.     Cliffton Asters Petrolatum-Mineral Oil (SYSTANE NIGHTTIME) OINT Place 1 application into both eyes at bedtime as needed (irritation).     . Calcium Carbonate-Vitamin D 600-400 MG-UNIT tablet Please get a calcium supplement tablet over-the-counter to equal 1200 mg of calcium per day 60 tablet 11  . carvedilol (COREG) 3.125 MG tablet TAKE ONE TABLET BY MOUTH TWO TIMES DAILY WITH A MEAL. 180 tablet 1  . EASY TOUCH PEN NEEDLES 31G X 5 MM MISC USE TO INJECT INSULIN 5 TIMES A DAY 200 each 11  . escitalopram (LEXAPRO) 20 MG tablet Take 1 tablet (20 mg total) by mouth daily. 90 tablet 3  . fexofenadine (ALLEGRA) 180 MG tablet Take 1 tablet (180 mg total) by mouth daily. 90 tablet 3  . Fluticasone-Salmeterol (ADVAIR DISKUS) 250-50 MCG/DOSE AEPB TAKE 1 INHALATION BY MOUTH TWICE DAILY 60 each 1  . furosemide (LASIX) 40 MG tablet TAKE 1/2 A TABLET (20 MG TOTAL) BY MOUTH DAILY. 45 tablet 2  . Insulin Glargine (LANTUS SOLOSTAR) 100 UNIT/ML Solostar Pen INJECT 64 UNITS SUBCUTANEOUSLY EVERY NIGHT AT BEDTIME AND 30 UNITS 12 HOURS LATER. 120 mL 1  . levothyroxine (SYNTHROID, LEVOTHROID) 112 MCG tablet TAKE 1 TABLET EVERY MORNING ON AN EMPTY STOMACH ONCE A DAY 90 tablet 1  . montelukast (SINGULAIR) 10 MG tablet TAKE 1 TABLET BY MOUTH EVERY EVENING. 90 tablet 0  . nitroGLYCERIN (NITROSTAT) 0.4 MG SL tablet Place 1 tablet under the tongue every 5 minutes as needed for chest pain. Patient needs an appointment for further refills 1st attempt 25 tablet 0  . NOVOLOG FLEXPEN 100 UNIT/ML FlexPen INJECT 20 UNITS  SUBCUTANEOUSLY THREE TIMES A DAY BEFORE MEALS; ADJUST AND INCREASE ACCORDINGLY. 18 mL 2  . ONETOUCH DELICA LANCETS 33G MISC USE TO CHECK BLOOD SUGAR THREE TIMES A DAY 100 each 10  . pantoprazole (PROTONIX) 20 MG tablet TAKE ONE TABLET BY MOUTH TWICE DAILY BEFORE A MEAL 90 tablet 2  .  potassium chloride (K-DUR,KLOR-CON) 10 MEQ tablet TAKE ONE TABLET BY MOUTH FOUR TIMES DAILY 360 tablet 0  . predniSONE (DELTASONE) 20 MG tablet Take 2 tabs po * 2 days, then 1 tabs for 2 d, then 1/2 tab 2 d 10 tablet 0  . PROAIR HFA 108 (90 Base) MCG/ACT inhaler INHALE 2 PUFFS BY MOUTH EVERY 6 HOURS AS NEEDED - PT MUST HAVE OFFICE VISIT FOR MORE REFILLS 8.5 each 0  . simvastatin (ZOCOR) 10 MG tablet TAKE 1 TABLET BY MOUTH EVERY EVENING 30 tablet 11  . tiotropium (SPIRIVA HANDIHALER) 18 MCG inhalation capsule INHALE THE CONTENTS OF 1 CAPSULE VIA HANDIHALER BY MOUTH EVERY DAY 90 capsule 1  . Vitamin D, Ergocalciferol, (DRISDOL) 50000 units CAPS capsule TAKE 1 CAPSULE (50,000 UNITS TOTAL) BY MOUTH EVERY 7 (SEVEN) DAYS. 12 capsule 1   No current facility-administered medications for this visit.     Allergies:  Allergies  Allergen Reactions  . Ace Inhibitors Cough  . Benadryl [Diphenhydramine Hcl (Sleep)] Other (See Comments)    hypotension  . Penicillins Hives and Swelling    Swelling of arms Has patient had a PCN reaction causing immediate rash, facial/tongue/throat swelling, SOB or lightheadedness with hypotension: unknown Has patient had a PCN reaction causing severe rash involving mucus membranes or skin necrosis: no Has patient had a PCN reaction that required hospitalization: unknown Has patient had a PCN reaction occurring within the last 10 years: no, childhood allergy If all of the above answers are "NO", then may proceed with Cephalosporin use.   . Sulfonamide Derivatives Swelling     Review of Systems: General:   No F/C, wt loss Pulm:   No DIB, SOB, pleuritic chest pain Card:  No CP,  palpitations Abd:  No n/v/d or pain Ext:  No inc edema from baseline  Objective:  Blood pressure 116/61, pulse (!) 106, height 5' (1.524 m), weight 190 lb (86.2 kg). Body mass index is 37.11 kg/m. Gen:   Well NAD, A and O *3 HEENT:    [place]/AT, EOMI,  MMM Lungs:   Normal work of breathing. CTA B/L, no Wh, rhonchi Heart:   RRR, S1, S2 WNL's, no MRG Abd:   No gross distention Exts:    warm, pink,  Brisk capillary refill, warm and well perfused.  Psych:    No HI/SI, judgement and insight good, Euthymic mood. Full Affect.   No results found for this or any previous visit (from the past 2160 hour(s)).

## 2017-06-05 NOTE — Assessment & Plan Note (Addendum)
  Having diabetes along with atherosclerotic cardiovascular disease, her LDL was 102 today, we will increase her Zocor from 10 mg nightly to 20 - Told patient goal LDL is less than 70.  One year ago it was 2456. - Discussed with patient that not only is it medication but also what she eats and how much she moves.  Educational handouts provided. .  Recheck ALT in 4-6 weeks, cholesterol panel in 4 months.

## 2017-06-10 NOTE — Telephone Encounter (Signed)
Called and spoke to [place].  Per Dr. Raliegh Scarlet the medication is to be taken twice weekly.  MPulliam, CMA/RT(R)

## 2017-06-13 ENCOUNTER — Other Ambulatory Visit: Payer: Self-pay | Admitting: *Deleted

## 2017-06-13 DIAGNOSIS — I6529 Occlusion and stenosis of unspecified carotid artery: Secondary | ICD-10-CM

## 2017-06-14 ENCOUNTER — Other Ambulatory Visit: Payer: Self-pay | Admitting: Family Medicine

## 2017-06-14 DIAGNOSIS — B078 Other viral warts: Secondary | ICD-10-CM | POA: Diagnosis not present

## 2017-06-14 DIAGNOSIS — I872 Venous insufficiency (chronic) (peripheral): Secondary | ICD-10-CM | POA: Diagnosis not present

## 2017-06-21 ENCOUNTER — Other Ambulatory Visit: Payer: Self-pay | Admitting: Adult Health

## 2017-06-24 ENCOUNTER — Other Ambulatory Visit: Payer: Self-pay | Admitting: Adult Health

## 2017-06-25 DIAGNOSIS — E7800 Pure hypercholesterolemia, unspecified: Secondary | ICD-10-CM | POA: Diagnosis not present

## 2017-06-25 DIAGNOSIS — E1165 Type 2 diabetes mellitus with hyperglycemia: Secondary | ICD-10-CM | POA: Diagnosis not present

## 2017-06-25 DIAGNOSIS — E039 Hypothyroidism, unspecified: Secondary | ICD-10-CM | POA: Diagnosis not present

## 2017-06-25 LAB — BASIC METABOLIC PANEL
BUN: 8 (ref 4–21)
BUN: 8 (ref 4–21)
Creatinine: 0.8 (ref 0.5–1.1)
Creatinine: 0.8 (ref 0.5–1.1)
Glucose: 221
Glucose: 221
Potassium: 4.5 (ref 3.4–5.3)
Sodium: 139 (ref 137–147)

## 2017-06-25 LAB — LIPID PANEL
Cholesterol: 177 (ref 0–200)
HDL: 44 (ref 35–70)
LDL Cholesterol: 88
Triglycerides: 225 — AB (ref 40–160)

## 2017-06-25 LAB — HEPATIC FUNCTION PANEL
ALT: 16 (ref 7–35)
AST: 26 (ref 13–35)
Alkaline Phosphatase: 178 — AB (ref 25–125)

## 2017-06-25 LAB — TSH: TSH: 0.5 (ref 0.41–5.90)

## 2017-06-25 LAB — HEMOGLOBIN A1C
Hemoglobin A1C: 8.3
Hgb A1c MFr Bld: 8.3 — AB (ref 4.0–6.0)

## 2017-06-25 NOTE — Telephone Encounter (Signed)
-   Pantoprazole at 40 mg twice daily is a large dose and this really should be monitored and coming from her gastroenterologist who initially prescribed it.   I am not aware of any hypersecretory condition or treatment of acute H. pylori infection that would warrant this dose.    Please explain to patient this is in her best interest to be properly monitored by the specialist.   If the patient's gastroenterologist does not feel she needs to continue the high-dose, it is best she does not.   If the specialist feels she needs to be on this high dose lifelong, then please have him send a letter or note stating so, and we can prescribe it under the direction of his expertise.  -Also potassium chloride should really be coming from her Cardiologist.

## 2017-06-25 NOTE — Telephone Encounter (Signed)
We have not prescribed potassium for the patient previously.  Please review and refill if appropriate.  Tiajuana Amass. Nathan Moctezuma, CMA

## 2017-06-25 NOTE — Telephone Encounter (Signed)
Actually, let me retract my last statement regarding the potassium as it appears for some reason we have been giving her her Lasix and not cardiology.  In the future we can discuss with the patient this coming from the specialist who is monitoring her heart on a yearly basis but for now okay to refill potassium at #90 no refill.

## 2017-06-25 NOTE — Telephone Encounter (Signed)
Patient's potassium on 10\8\18 was 4.2 and stable.  Okay to refill K Dur 10 mEq as written, since it appears we have given her her Lasix in the past.  I will discuss with the patient her possibly getting her Lasix from cardiology in the future, since they are monitoring her cardiovascular system on a yearly basis.

## 2017-06-26 ENCOUNTER — Other Ambulatory Visit: Payer: Self-pay

## 2017-06-26 DIAGNOSIS — K219 Gastro-esophageal reflux disease without esophagitis: Secondary | ICD-10-CM

## 2017-06-26 MED ORDER — PANTOPRAZOLE SODIUM 20 MG PO TBEC: 20.0000 mg | DELAYED_RELEASE_TABLET | Freq: Two times a day (BID) | ORAL | 0 refills | Status: DC

## 2017-06-26 NOTE — Telephone Encounter (Signed)
Patient notified, patient states that she has not seen the GI doctor in years and was not aware of the risk with her dose of the Protonix.  Patient is willing to start taking a lower dose.  Spoke with Dr. Sharee Holsterpalski and she approved Protonix 20 mg bid and for the patient to follow up with us and if that is working for the patient then we could go down to once a day.  Patient notified and new rx sent into the pharmacy. MPulliam, CMA/RT(R)

## 2017-06-26 NOTE — Addendum Note (Signed)
Addended by: Leda MinPULLIAM, Abrianna Sidman D on: 06/26/2017 03:33 PM   Modules accepted: Orders

## 2017-06-26 NOTE — Telephone Encounter (Signed)
RX printed, resent to the pharmacy and shredded the printed copy. mp

## 2017-06-29 DIAGNOSIS — G4733 Obstructive sleep apnea (adult) (pediatric): Secondary | ICD-10-CM | POA: Diagnosis not present

## 2017-07-02 DIAGNOSIS — E039 Hypothyroidism, unspecified: Secondary | ICD-10-CM | POA: Diagnosis not present

## 2017-07-02 DIAGNOSIS — I10 Essential (primary) hypertension: Secondary | ICD-10-CM | POA: Diagnosis not present

## 2017-07-02 DIAGNOSIS — E7800 Pure hypercholesterolemia, unspecified: Secondary | ICD-10-CM | POA: Diagnosis not present

## 2017-07-02 DIAGNOSIS — E1165 Type 2 diabetes mellitus with hyperglycemia: Secondary | ICD-10-CM | POA: Diagnosis not present

## 2017-07-03 ENCOUNTER — Ambulatory Visit: Payer: PPO | Admitting: Family Medicine

## 2017-07-04 ENCOUNTER — Encounter (INDEPENDENT_AMBULATORY_CARE_PROVIDER_SITE_OTHER): Payer: PPO | Admitting: Ophthalmology

## 2017-07-15 ENCOUNTER — Other Ambulatory Visit: Payer: Self-pay | Admitting: Adult Health

## 2017-07-15 ENCOUNTER — Other Ambulatory Visit: Payer: Self-pay | Admitting: Cardiovascular Disease

## 2017-07-19 ENCOUNTER — Other Ambulatory Visit: Payer: Self-pay | Admitting: Family Medicine

## 2017-07-19 ENCOUNTER — Other Ambulatory Visit: Payer: Self-pay | Admitting: Cardiovascular Disease

## 2017-07-19 DIAGNOSIS — K219 Gastro-esophageal reflux disease without esophagitis: Secondary | ICD-10-CM

## 2017-07-22 NOTE — Telephone Encounter (Signed)
Medication Detail    Disp Refills Start End   furosemide (LASIX) 40 MG tablet 45 tablet 3 01/28/2017    Sig - Route: Take 0.5 tablets (20 mg total) by mouth daily. - Oral   Sent to pharmacy as: furosemide (LASIX) 40 MG tablet   E-Prescribing Status: Receipt confirmed by pharmacy (01/28/2017 1:50 PM EDT)   Pharmacy   [facility] - [place] - [id number] [address]

## 2017-07-24 ENCOUNTER — Ambulatory Visit (INDEPENDENT_AMBULATORY_CARE_PROVIDER_SITE_OTHER): Payer: PPO | Admitting: Family Medicine

## 2017-07-24 ENCOUNTER — Encounter: Payer: Self-pay | Admitting: Family Medicine

## 2017-07-24 VITALS — BP 121/55 | HR 73 | Temp 98.4°F | Resp 97 | Ht 60.0 in | Wt 188.0 lb

## 2017-07-24 DIAGNOSIS — E559 Vitamin D deficiency, unspecified: Secondary | ICD-10-CM

## 2017-07-24 DIAGNOSIS — E669 Obesity, unspecified: Secondary | ICD-10-CM

## 2017-07-24 DIAGNOSIS — E118 Type 2 diabetes mellitus with unspecified complications: Secondary | ICD-10-CM | POA: Diagnosis not present

## 2017-07-24 DIAGNOSIS — E1159 Type 2 diabetes mellitus with other circulatory complications: Secondary | ICD-10-CM

## 2017-07-24 DIAGNOSIS — I10 Essential (primary) hypertension: Secondary | ICD-10-CM

## 2017-07-24 DIAGNOSIS — E876 Hypokalemia: Secondary | ICD-10-CM | POA: Diagnosis not present

## 2017-07-24 DIAGNOSIS — E66811 Obesity, class 1: Secondary | ICD-10-CM

## 2017-07-24 DIAGNOSIS — I5032 Chronic diastolic (congestive) heart failure: Secondary | ICD-10-CM | POA: Diagnosis not present

## 2017-07-24 DIAGNOSIS — E1069 Type 1 diabetes mellitus with other specified complication: Secondary | ICD-10-CM | POA: Diagnosis not present

## 2017-07-24 DIAGNOSIS — J42 Unspecified chronic bronchitis: Secondary | ICD-10-CM

## 2017-07-24 DIAGNOSIS — E039 Hypothyroidism, unspecified: Secondary | ICD-10-CM

## 2017-07-24 DIAGNOSIS — F32A Depression, unspecified: Secondary | ICD-10-CM

## 2017-07-24 DIAGNOSIS — F329 Major depressive disorder, single episode, unspecified: Secondary | ICD-10-CM | POA: Diagnosis not present

## 2017-07-24 DIAGNOSIS — K219 Gastro-esophageal reflux disease without esophagitis: Secondary | ICD-10-CM

## 2017-07-24 DIAGNOSIS — E782 Mixed hyperlipidemia: Secondary | ICD-10-CM

## 2017-07-24 DIAGNOSIS — Z794 Long term (current) use of insulin: Secondary | ICD-10-CM

## 2017-07-24 DIAGNOSIS — I152 Hypertension secondary to endocrine disorders: Secondary | ICD-10-CM

## 2017-07-24 DIAGNOSIS — IMO0001 Reserved for inherently not codable concepts without codable children: Secondary | ICD-10-CM

## 2017-07-24 MED ORDER — LOSARTAN POTASSIUM 25 MG PO TABS: 25.0000 mg | ORAL_TABLET | Freq: Every day | ORAL | 3 refills | Status: DC

## 2017-07-24 MED ORDER — ESCITALOPRAM OXALATE 20 MG PO TABS: 20.0000 mg | ORAL_TABLET | Freq: Every day | ORAL | 3 refills | Status: DC

## 2017-07-24 MED ORDER — PANTOPRAZOLE SODIUM 20 MG PO TBEC: 20.0000 mg | DELAYED_RELEASE_TABLET | Freq: Two times a day (BID) | ORAL | 1 refills | Status: DC

## 2017-07-24 NOTE — Progress Notes (Signed)
Impression and Recommendations:    1. Obesity, Class I, BMI 30-34.9   2. Gastroesophageal reflux disease, esophagitis presence not specified   3. Mixed diabetic hyperlipidemia associated with type 1 diabetes mellitus (HCC)   4. Insulin dependent diabetes mellitus with complications (HCC)   5. Hypothyroidism, unspecified type   6. Hypertension associated with diabetes (HCC)   7. Heart failure, diastolic, chronic (HCC)   8. Depression, unspecified depression type   9. Chronic bronchitis, unspecified chronic bronchitis type (HCC)   10. Vitamin D deficiency   11. Hypokalemia     Meds ordered this encounter  Medications  . DISCONTD: pantoprazole (PROTONIX) 20 MG tablet    Sig: Take 1 tablet (20 mg total) by mouth 2 (two) times daily before a meal.    Dispense:  180 tablet    Refill:  1  . escitalopram (LEXAPRO) 20 MG tablet    Sig: Take 1 tablet (20 mg total) by mouth daily.    Dispense:  90 tablet    Refill:  3  . DISCONTD: losartan (COZAAR) 25 MG tablet    Sig: Take 1 tablet (25 mg total) by mouth daily.    Dispense:  90 tablet    Refill:  3   Gross side effects, risk and benefits, and alternatives of medications and treatment plan in general discussed with patient.  Patient is aware that all medications have potential side effects and we are unable to predict every side effect or drug-drug interaction that may occur.   Patient will call with any questions prior to using medication if they have concerns.  Expresses verbal understanding and consents to current therapy and treatment regimen.  No barriers to understanding were identified.  Red flag symptoms and signs discussed in detail.  Patient expressed understanding regarding what to do in case of emergency\urgent symptoms  Please see AVS handed out to patient at the end of our visit for further patient instructions/ counseling done pertaining to today's office visit.   Return for follow up every 3 mo.    Note: This note  was prepared with assistance of Dragon voice recognition software. Occasional wrong-word or sound-a-like substitutions may have occurred due to the inherent limitations of voice recognition software.  Aracelie Addis 12:01 PM --------------------------------------------------------------------------------------------------------------------------------------------------------------------------------------------------------------------------------------------    Subjective:    CC:  Chief Complaint  Patient presents with  . Follow-up    HPI: MARYLEN ZUK is a 83 y.o. female who presents to [facility] today for issues as discussed below.   DM:  Recently went to see Dr. Cleon Gustin her endocrinologist who controls her diabetes.  A1c was much improved at 8 recently- either end of October or early November.  Last checked here on 05/27/2017 and it was 9.2.    Patient states that she is just eating better.  She is more conscientious of what she puts in her body.    - BS- checks Am and PM now.  FBS- 120's;   2 hr PP- 160's.    Hyperlipidemia:  -In the past we did have patient on Crestor low dose which she did not tolerate well. - prior patient had been on atorvastatin 20 and had complained of some leg pain around May 2018.  It was shortly thereafter in October 2018 we started patient on Crestor 20 mg.  Then in the subsequent office visit in June 05, 2017, patient was changed from Crestor to simvastatin.    --->  Today patient  has been on the simvastatin 10 mg daily at bedtime since 06/05/2017.  She denies any side effect.  She denies any leg pain or new muscle aches in her legs.   Vitamin D:  in September still shows vitamin D under 30.  On 06/05/2017 with increased patient's vitamin D of 50,000 units twice weekly.  She has been compliant.  We will recheck this on next blood work.  Patient tells me she does feel less achy in her joints and muscles.  However we did change  her statin medication as well    Wt Readings from Last 3 Encounters:  12/04/17 193 lb 8 oz (87.8 kg)  07/24/17 188 lb (85.3 kg)  06/05/17 190 lb (86.2 kg)   BP Readings from Last 3 Encounters:  12/04/17 126/70  07/24/17 (!) 121/55  06/05/17 116/61   Pulse Readings from Last 3 Encounters:  12/04/17 70  07/24/17 73  06/05/17 (!) 106   BMI Readings from Last 3 Encounters:  12/04/17 37.79 kg/m  07/24/17 36.72 kg/m  06/05/17 37.11 kg/m     Patient Care Team    Relationship Specialty Notifications Start End  Thomasene Lotpalski, Shailene Demonbreun, DO PCP - General Family Medicine  03/12/16   Kalman Shanamaswamy, Murali, MD Attending Physician Pulmonary Disease Abnormal results only, Admissions 12/07/12   Marcellus Scotthodri, Tanvir, MD Attending Physician Pulmonary Disease  05/01/16   Nahser, Deloris PingPhilip J, MD Consulting Physician Cardiology  05/01/16   Lynann BolognaGupta, Rajesh, MD Referring Physician Gastroenterology  05/01/16   Dorisann FramesBalan, Bindubal, MD Consulting Physician Endocrinology  05/22/17      Patient Active Problem List   Diagnosis Date Noted  . Hypertension associated with diabetes (HCC) 05/22/2017    Priority: High  . Obesity, Class I, BMI 30-34.9 06/23/2016    Priority: High  . Hypothyroidism 03/13/2016    Priority: High  . Insulin dependent diabetes mellitus with complications (HCC) 03/13/2016    Priority: High  . 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 Depression 03/13/2016    Priority: Medium  . Coronary artery disease 04/11/2011    Priority: Medium  . COPD (chronic obstructive pulmonary disease) (HCC) 04/11/2011    Priority: Medium  . Obstructive sleep apnea 04/11/2011    Priority: Medium  . Heart failure, diastolic, chronic (HCC) 03/29/2008    Priority: Medium  . Vitamin D deficiency 05/11/2016    Priority: Low  . Atherosclerosis of carotid arteries 04/03/2006    Priority: Low  . Gastroesophageal reflux disease 12/31/2017  . chronic Low  platelet count 06/05/2017  . chronic Leukopenia 05/11/2016  . Elevated serum alkaline phosphatase level- hepatocellular in origin 05/11/2016  . Constipation, chronic 03/13/2016  . At high risk for osteoporosis 03/13/2016  . Hypotension 12/13/2012  . Hypokalemia 12/13/2012  . Thrombocytopenia, unspecified (HCC) 12/13/2012  . h/o Clostridium difficile colitis 12/12/2012  . History of GI bleed 12/12/2012  . ILD (interstitial lung disease) (HCC) 11/25/2012    Past Medical history, Surgical history, Family history, Social history, Allergies and Medications have been entered into the medical record, reviewed and changed as needed.    Current Meds  Medication Sig  . Blood Glucose Monitoring Suppl (FREESTYLE LITE) DEVI Use to check blood sugars 2-3 times daily  . Calcium Carbonate-Vitamin D 600-400 MG-UNIT tablet Please get a calcium supplement tablet over-the-counter to equal 1200 mg of calcium per day  . cycloSPORINE (RESTASIS) 0.05 % ophthalmic emulsion Place 1 drop into both eyes 2 (two) times daily.  .Marland Kitchen  escitalopram (LEXAPRO) 20 MG tablet Take 1 tablet (20 mg total) by mouth daily.  Marland Kitchen. glucose blood (FREESTYLE LITE) test strip Test 3 to 4 times daily  . nitroGLYCERIN (NITROSTAT) 0.4 MG SL tablet Place 1 tablet under the tongue every 5 minutes as needed for chest pain. Patient needs an appointment for further refills 1st attempt  . ONE TOUCH ULTRA TEST test strip TEST BLOOD SUGAR 2-3 TIMES A DAY  . potassium chloride (KLOR-CON) 10 MEQ CR tablet Take 10 mEq by mouth 2 (two) times daily.   Cliffton Asters. White Petrolatum-Mineral Oil (SYSTANE NIGHTTIME) OINT Place 1 application into both eyes at bedtime as needed (irritation).   . [DISCONTINUED] carvedilol (COREG) 3.125 MG tablet TAKE ONE TABLET BY MOUTH TWO TIMES DAILY WITH A MEAL.  . [DISCONTINUED] EASY TOUCH PEN NEEDLES 31G X 5 MM MISC USE TO INJECT INSULIN 5 TIMES A DAY  . [DISCONTINUED] escitalopram (LEXAPRO) 20 MG tablet Take 1 tablet (20 mg total) by  mouth daily.  . [DISCONTINUED] Fluticasone-Salmeterol (ADVAIR DISKUS) 250-50 MCG/DOSE AEPB TAKE 1 INHALATION BY MOUTH TWICE DAILY  . [DISCONTINUED] furosemide (LASIX) 40 MG tablet Take 0.5 tablets (20 mg total) by mouth daily.  . [DISCONTINUED] furosemide (LASIX) 40 MG tablet Take 0.5 tablets (20 mg total) by mouth daily. Please schedule yearly appt for anymore refills, thanks! [phone] 1st Attempt  . [DISCONTINUED] LANTUS SOLOSTAR 100 UNIT/ML Solostar Pen INJECT 64 UNITS SUBCUTANEOUSLY EVERY NIGHT AT BEDTIME AND 30 UNITS 12 HOURS LATER.  . [DISCONTINUED] levothyroxine (SYNTHROID, LEVOTHROID) 112 MCG tablet TAKE 1 TABLET EVERY MORNING ON AN EMPTY STOMACH ONCE A DAY  . [DISCONTINUED] losartan (COZAAR) 25 MG tablet Take 1 tablet (25 mg total) by mouth daily.  . [DISCONTINUED] losartan (COZAAR) 25 MG tablet Take 1 tablet (25 mg total) by mouth daily.  . [DISCONTINUED] montelukast (SINGULAIR) 10 MG tablet Take 1 tablet (10 mg total) by mouth every evening.  . [DISCONTINUED] NOVOLOG FLEXPEN 100 UNIT/ML FlexPen INJECT 20 UNITS SUBCUTANEOUSLY THREE TIMES A DAY BEFORE MEALS; ADJUST AND INCREASE ACCORDINGLY.  . [DISCONTINUED] ONETOUCH DELICA LANCETS 33G MISC USE TO CHECK BLOOD SUGAR THREE TIMES A DAY  . [DISCONTINUED] pantoprazole (PROTONIX) 20 MG tablet TAKE 1 TABLET BY MOUTH TWICE DAILY BEFORE A MEAL  . [DISCONTINUED] pantoprazole (PROTONIX) 20 MG tablet Take 1 tablet (20 mg total) by mouth 2 (two) times daily before a meal.  . [DISCONTINUED] potassium chloride (K-DUR,KLOR-CON) 10 MEQ tablet TAKE ONE TABLET BY MOUTH FOUR TIMES DAILY  . [DISCONTINUED] PROAIR HFA 108 (90 Base) MCG/ACT inhaler INHALE 2 PUFFS BY MOUTH EVERY 6 HOURS AS NEEDED - PT MUST HAVE OFFICE VISIT FOR MORE REFILLS  . [DISCONTINUED] simvastatin (ZOCOR) 20 MG tablet Take 0.5 tablets (10 mg total) by mouth at bedtime.  . [DISCONTINUED] tiotropium (SPIRIVA HANDIHALER) 18 MCG inhalation capsule INHALE THE CONTENTS OF 1 CAPSULE VIA  HANDIHALER BY MOUTH EVERY DAY  . [DISCONTINUED] Vitamin D, Ergocalciferol, (DRISDOL) 50000 units CAPS capsule Take 1 capsule (50,000 Units total) by mouth 2 (two) times a week.    Allergies:  Allergies  Allergen Reactions  . Ace Inhibitors Cough  . Benadryl [Diphenhydramine Hcl (Sleep)] Other (See Comments)    hypotension  . Penicillins Hives and Swelling    Swelling of arms Has patient had a PCN reaction causing immediate rash, facial/tongue/throat swelling, SOB or lightheadedness with hypotension: unknown Has patient had a PCN reaction causing severe rash involving mucus membranes or skin necrosis: no Has patient had a PCN reaction that required hospitalization: unknown  Has patient had a PCN reaction occurring within the last 10 years: no, childhood allergy If all of the above answers are "NO", then may proceed with Cephalosporin use.   . Sulfonamide Derivatives Swelling     Review of Systems: General:   Denies fever, chills, unexplained weight loss.  Optho/Auditory:   Denies visual changes, blurred vision/LOV Respiratory:   Denies wheeze, DOE more than baseline levels.  Cardiovascular:   Denies chest pain, palpitations, new onset peripheral edema  Gastrointestinal:   Denies nausea, vomiting, diarrhea, abd pain.  Genitourinary: Denies dysuria, freq/ urgency, flank pain or discharge from genitals.  Endocrine:     Denies hot or cold intolerance, polyuria, polydipsia. Musculoskeletal:   Denies unexplained myalgias, joint swelling, unexplained arthralgias, gait problems.  Skin:  Denies new onset rash, suspicious lesions Neurological:     Denies dizziness, unexplained weakness, numbness  Psychiatric/Behavioral:   Denies mood changes, suicidal or homicidal ideations, hallucinations    Objective:   Blood pressure (!) 121/55, pulse 73, temperature 98.4 F (36.9 C), resp. rate (!) 97, height 5' (1.524 m), weight 188 lb (85.3 kg). Body mass index is 36.72 kg/m. General:  Well  Developed, well nourished, appropriate for stated age.  Neuro:  Alert and oriented,  extra-ocular muscles intact  HEENT:  Normocephalic, atraumatic, neck supple, no carotid bruits appreciated  Skin:  no gross rash, warm, pink. Cardiac:  RRR, S1 S2 Respiratory:  ECTA B/L and A/P, Not using accessory muscles, speaking in full sentences- unlabored. Vascular:  Ext warm, no cyanosis apprec.; cap RF less 2 sec. Psych:  No HI/SI, judgement and insight good, Euthymic mood. Full Affect.

## 2017-07-24 NOTE — Patient Instructions (Addendum)
-  Since patient was tried on a new cholesterol medication in October we will recheck her ALT and fasting lipid profile in 3 months.  Prior times when we changed her from statin to statin her LFTs remained extremely stable despite her myalgias.  -Also since increasing her vitamin D to 2 times weekly approximately mid October, we will recheck vitamin D level at next office visit along with FLP and CMP  -Please try to start a little bit of a walking program by the next time I see you even if it is 5 minutes every other day, at least it something  -Since we have not change her diuretic\Lasix or potassium supplement at all no need to check BMP today.

## 2017-07-29 DIAGNOSIS — G4733 Obstructive sleep apnea (adult) (pediatric): Secondary | ICD-10-CM | POA: Diagnosis not present

## 2017-07-31 IMAGING — CR DG LUMBAR SPINE COMPLETE 4+V
5 series · 5 of 5 positions shown · non-contrast
Comparison: CT abdomen dated 10/30/2010.

CLINICAL DATA: Syncope and fall today, right lower back pain.
Chronic back pain.

EXAM:
LUMBAR SPINE - COMPLETE 4+ VIEW

[t lumbar spine ap]
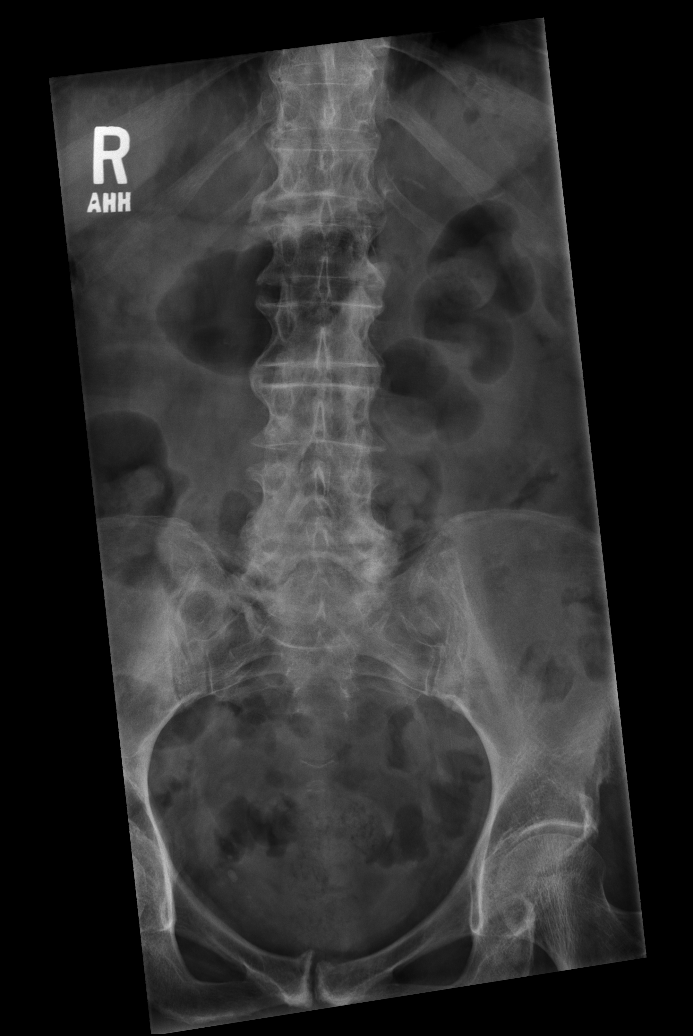

[t lumbar spine obl (1 of 2)]
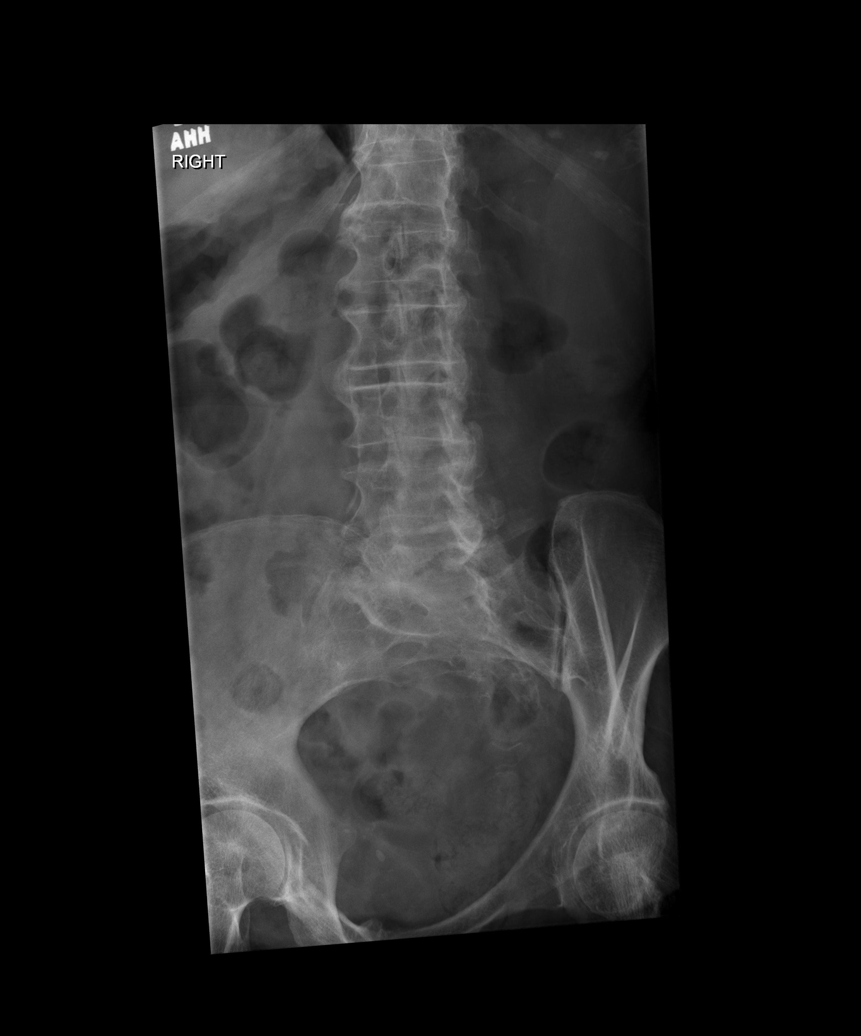

[t lumbar spine obl (2 of 2)]
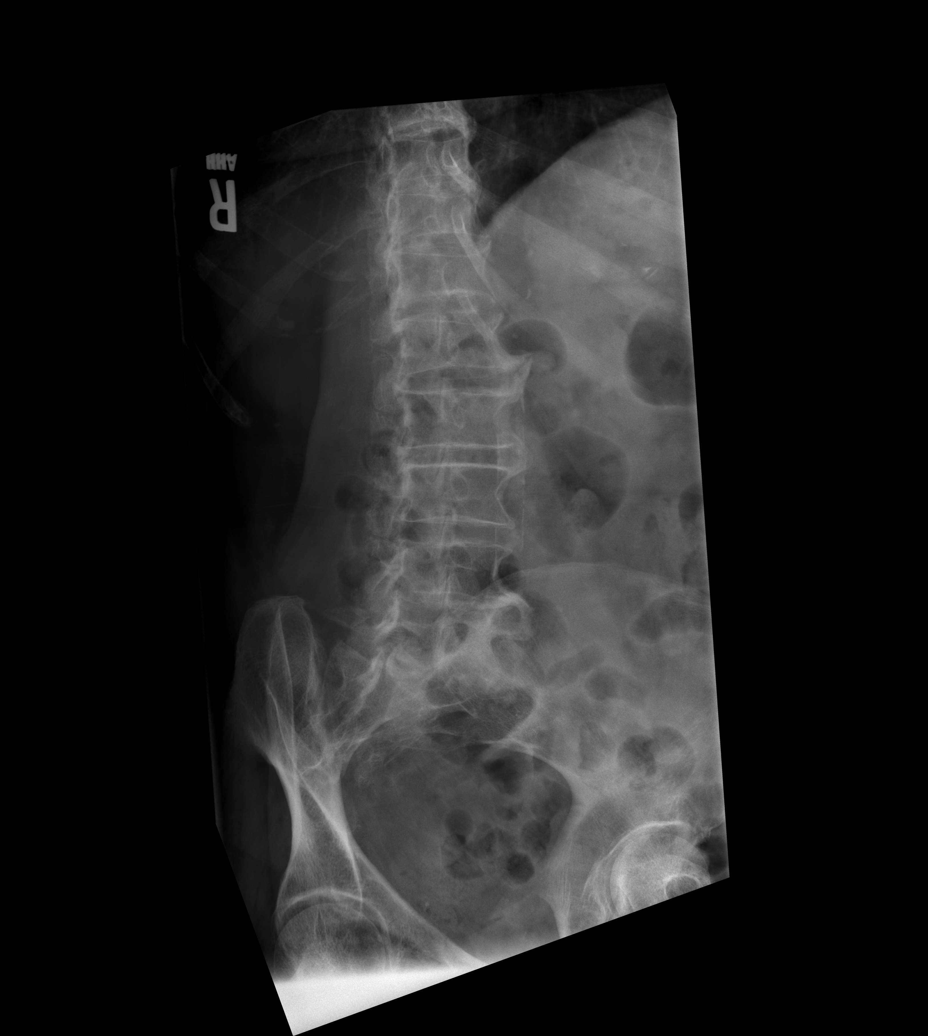

[t lumbar spine lat]
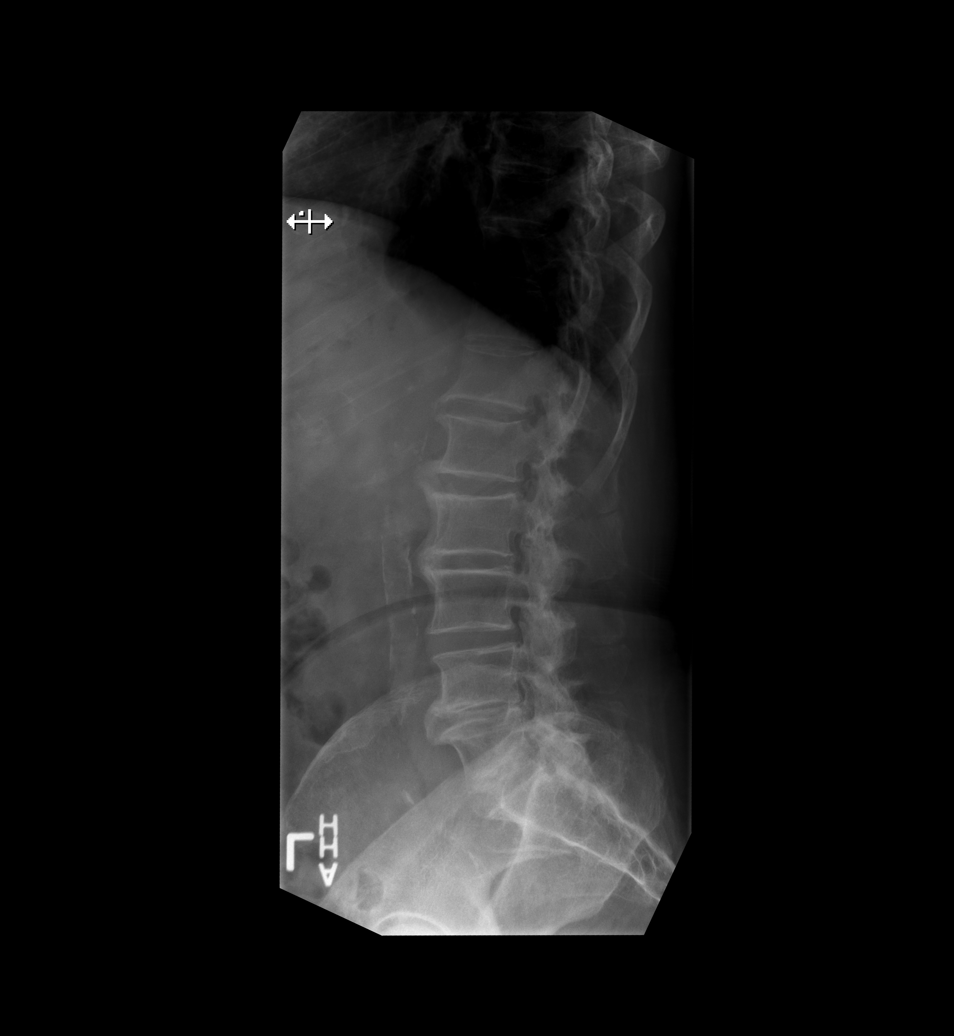

[t lumbar l-5 s-1 spot]
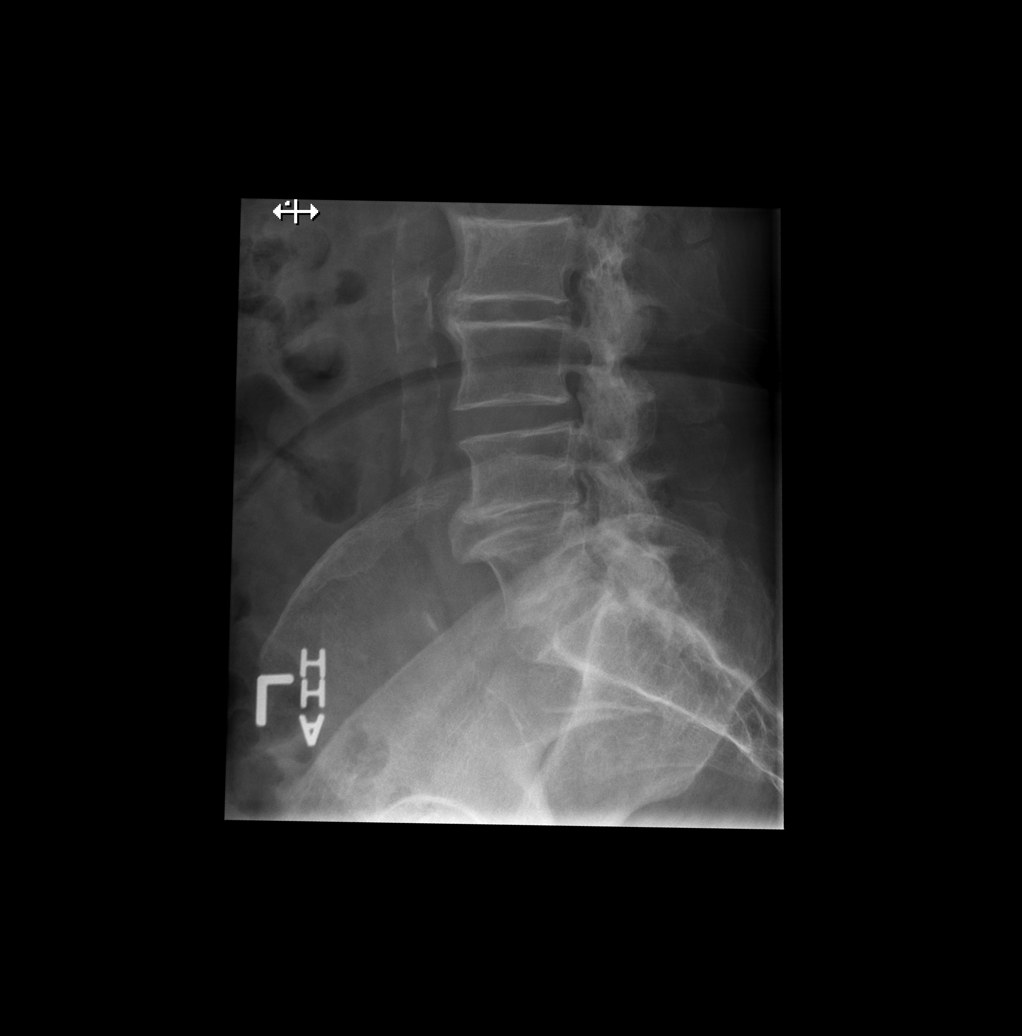

[5 of 5 positions shown; findings below may reference images not displayed]

FINDINGS: Degenerative changes are now seen throughout the lumbar spine, at
least moderate in degree, advanced since previous CT with disc space
narrowings and osteophyte throughout. Based on the oblique
projections, there are probable osseous neural foramen narrowings at
multiple levels with possible associated nerve root impingements.

Osseous alignment is stable. No acute or suspicious osseous lesions
seen. No fracture line or displaced fracture fragment. No
compression fracture deformity. Upper sacrum appears intact and
normally aligned.

Atherosclerotic changes are seen along the walls of the infrarenal
abdominal aorta. Paravertebral soft tissues are otherwise
unremarkable.
IMPRESSION: 1. Degenerative changes of the lumbar spine, as detailed above,
progressed since previous CT of 6006. Suspect associated nerve root
impingement at 1 or more levels. If any associated radiculopathic
symptoms, would consider nonemergent MRI for further
characterization.
2. Overall alignment of the lumbar spine is stable. No acute
findings.

## 2017-08-01 ENCOUNTER — Encounter (INDEPENDENT_AMBULATORY_CARE_PROVIDER_SITE_OTHER): Payer: PPO | Admitting: Ophthalmology

## 2017-08-09 ENCOUNTER — Ambulatory Visit: Payer: PPO | Admitting: *Deleted

## 2017-08-21 ENCOUNTER — Other Ambulatory Visit: Payer: Self-pay | Admitting: Family Medicine

## 2017-08-29 DIAGNOSIS — G4733 Obstructive sleep apnea (adult) (pediatric): Secondary | ICD-10-CM | POA: Diagnosis not present

## 2017-09-02 ENCOUNTER — Encounter (INDEPENDENT_AMBULATORY_CARE_PROVIDER_SITE_OTHER): Payer: PPO | Admitting: Ophthalmology

## 2017-09-09 ENCOUNTER — Ambulatory Visit (HOSPITAL_COMMUNITY)
Admission: RE | Admit: 2017-09-09 | Discharge: 2017-09-09 | Disposition: A | Discharge status: Home / Self Care | Payer: PPO | Source: Ambulatory Visit | Attending: Cardiology | Admitting: Cardiology

## 2017-09-09 DIAGNOSIS — I6529 Occlusion and stenosis of unspecified carotid artery: Secondary | ICD-10-CM

## 2017-09-16 ENCOUNTER — Encounter (INDEPENDENT_AMBULATORY_CARE_PROVIDER_SITE_OTHER): Payer: PPO | Admitting: Ophthalmology

## 2017-09-16 DIAGNOSIS — H35033 Hypertensive retinopathy, bilateral: Secondary | ICD-10-CM | POA: Diagnosis not present

## 2017-09-16 DIAGNOSIS — E113592 Type 2 diabetes mellitus with proliferative diabetic retinopathy without macular edema, left eye: Secondary | ICD-10-CM

## 2017-09-16 DIAGNOSIS — E11311 Type 2 diabetes mellitus with unspecified diabetic retinopathy with macular edema: Secondary | ICD-10-CM

## 2017-09-16 DIAGNOSIS — H43813 Vitreous degeneration, bilateral: Secondary | ICD-10-CM | POA: Diagnosis not present

## 2017-09-16 DIAGNOSIS — E113511 Type 2 diabetes mellitus with proliferative diabetic retinopathy with macular edema, right eye: Secondary | ICD-10-CM | POA: Diagnosis not present

## 2017-09-16 DIAGNOSIS — I10 Essential (primary) hypertension: Secondary | ICD-10-CM

## 2017-09-18 ENCOUNTER — Other Ambulatory Visit: Payer: Self-pay | Admitting: Family Medicine

## 2017-09-29 DIAGNOSIS — G4733 Obstructive sleep apnea (adult) (pediatric): Secondary | ICD-10-CM | POA: Diagnosis not present

## 2017-10-14 ENCOUNTER — Other Ambulatory Visit: Payer: Self-pay | Admitting: Family Medicine

## 2017-10-16 ENCOUNTER — Other Ambulatory Visit: Payer: Self-pay | Admitting: Family Medicine

## 2017-10-16 DIAGNOSIS — K219 Gastro-esophageal reflux disease without esophagitis: Secondary | ICD-10-CM

## 2017-10-27 DIAGNOSIS — G4733 Obstructive sleep apnea (adult) (pediatric): Secondary | ICD-10-CM | POA: Diagnosis not present

## 2017-10-30 ENCOUNTER — Ambulatory Visit: Payer: PPO | Admitting: Family Medicine

## 2017-11-13 ENCOUNTER — Other Ambulatory Visit: Payer: Self-pay | Admitting: Family Medicine

## 2017-11-13 ENCOUNTER — Other Ambulatory Visit: Payer: Self-pay | Admitting: Adult Health

## 2017-11-27 DIAGNOSIS — G4733 Obstructive sleep apnea (adult) (pediatric): Secondary | ICD-10-CM | POA: Diagnosis not present

## 2017-12-04 ENCOUNTER — Encounter: Payer: Self-pay | Admitting: Family Medicine

## 2017-12-04 ENCOUNTER — Ambulatory Visit (INDEPENDENT_AMBULATORY_CARE_PROVIDER_SITE_OTHER): Payer: PPO | Admitting: Family Medicine

## 2017-12-04 VITALS — BP 126/70 | HR 70 | Temp 98.0°F | Ht 60.0 in | Wt 193.5 lb

## 2017-12-04 DIAGNOSIS — J329 Chronic sinusitis, unspecified: Secondary | ICD-10-CM | POA: Diagnosis not present

## 2017-12-04 DIAGNOSIS — J4541 Moderate persistent asthma with (acute) exacerbation: Secondary | ICD-10-CM | POA: Diagnosis not present

## 2017-12-04 DIAGNOSIS — J310 Chronic rhinitis: Secondary | ICD-10-CM

## 2017-12-04 DIAGNOSIS — J3089 Other allergic rhinitis: Secondary | ICD-10-CM

## 2017-12-04 MED ORDER — PREDNISONE 20 MG PO TABS: ORAL_TABLET | ORAL | 0 refills | Status: DC

## 2017-12-04 MED ORDER — FEXOFENADINE HCL 180 MG PO TABS: 180.0000 mg | ORAL_TABLET | Freq: Every day | ORAL | 3 refills | Status: DC

## 2017-12-04 NOTE — Patient Instructions (Signed)
You can use over-the-counter afrin nasal spray for up to 3 days (NO longer than that) which will help acutely with nasal drainage/ congestion short term.     Also, sterile saline nasal rinses, such as Neil med or AYR sinus rinses, can be very helpful and should be done twice daily- especially throughout the allergy season.   Remember you should use distilled water or previously boiled water to do this.  Then you may use over-the-counter Flonase 1 spray each nostril twice daily after sinus rinses.  You can do this in addition to taking any Allegra or Claritin or Zyrtec etc. that you may be taking daily.  If your eyes tend to get an itchy or irritated feeling when your seasonal allergies get bad, you can use Naphcon-A over-the-counter eyedrops as needed  

## 2017-12-04 NOTE — Progress Notes (Signed)
Acute Care Office visit  Assessment and plan:  1. Moderate persistent asthmatic bronchitis with acute exacerbation   2. Environmental and seasonal allergies   3. Rhinosinusitis      -Start prednisone.  -Continue singulair, proair, advair.  -recommend starting allegra OTC.   -Call the office if you develop fevers or if your symptoms do not slowly improve after 7-10 days.   -Avoid exposure to environmental allergens. Wear an N95 respirator mask.   -Use a neti pot or AYR sinus rinses, followed by one spray of flonase in each nostril.   - Viral vs Allergic vs Bacterial causes for pt's symptoms reveiwed.    - Supportive care and various OTC medications discussed in addition to any prescribed. - Call or RTC if new symptoms, or if no improvement or worse over next several days.   - Will consider ABX if sx continue past 10 days and worsening if not already given.    Meds ordered this encounter  Medications  . predniSONE (DELTASONE) 20 MG tablet    Sig: Take 2 tabs po * 2 days, then 1 tabs for 2 d, then 1/2 tab 2 d    Dispense:  10 tablet    Refill:  0  . fexofenadine (ALLEGRA) 180 MG tablet    Sig: Take 1 tablet (180 mg total) by mouth daily.    Dispense:  90 tablet    Refill:  3     Gross side effects, risk and benefits, and alternatives of medications discussed with patient.  Patient is aware that all medications have potential side effects and we are unable to predict every sideeffect or drug-drug interaction that may occur.  Expresses verbal understanding and consents to current therapy plan and treatment regiment.   Education and routine counseling performed. Handouts provided.  Anticipatory guidance and routine counseling done re: condition, txmnt options and need for follow up. All questions of patient's were answered.  Return if symptoms worsen or fail to improve, for F-up of current med issues as previously d/c pt.  Please see AVS handed out to patient at the  end of our visit for additional patient instructions/ counseling done pertaining to today's office visit.  Note: This document was partially repared using Dragon voice recognition software and may include unintentional dictation errors.   This document serves as a record of services personally performed by Thomasene Loteborah Sasuke Yaffe, DO. It was created on her behalf by Thelma BargeNick Cochran, a trained medical scribe. The creation of this record is based on the scribe's personal observations and the provider's statements to them.   I have reviewed the above medical documentation for accuracy and completeness and I concur.  Thomasene LotDeborah Jennifer Payes 12/08/17 8:05 PM     Subjective:    Chief Complaint  Patient presents with  . Sinus Problem    headache, sinus pain and pressure x 2-3 days    HPI:  Pt presents with Sx for 2-3 days.   C/o: sinus pressure, sinus pain, wheezing (since yesterday), congestion, SOB (for 1 week). She has bilateral facial pain and some ear pain.   Denies: fever, chills, cough,     For symptoms patient has tried:  advair and albuterol with mild to no relief. She has used these about twice a day. She uses these regularly about once a day, which is her baseline. She has a h/o seasonal allergies and her breathing gets worse during this time of year. She is on singulair daily and is compliant with her  medications. She does not take any other daily seasonal allergy medications. She has used a neti pot before with good relief to her symptoms.   She has used prednisone before without complication.   Overall getting:  worse   Patient Care Team    Relationship Specialty Notifications Start End  Thomasene Lot, DO PCP - General Family Medicine  03/12/16   Kalman Shan, MD Attending Physician Pulmonary Disease Abnormal results only, Admissions 12/07/12   Marcellus Scott, MD Attending Physician Pulmonary Disease  05/01/16   Nahser, Deloris Ping, MD Consulting Physician Cardiology  05/01/16     Lynann Bologna, MD Referring Physician Gastroenterology  05/01/16   Dorisann Frames, MD Consulting Physician Endocrinology  05/22/17     Past medical history, Surgical history, Family history reviewed and noted below, Social history, Allergies, and Medications have been entered into the medical record, reviewed and changed as needed.   Allergies  Allergen Reactions  . Ace Inhibitors Cough  . Benadryl [Diphenhydramine Hcl (Sleep)] Other (See Comments)    hypotension  . Penicillins Hives and Swelling    Swelling of arms Has patient had a PCN reaction causing immediate rash, facial/tongue/throat swelling, SOB or lightheadedness with hypotension: unknown Has patient had a PCN reaction causing severe rash involving mucus membranes or skin necrosis: no Has patient had a PCN reaction that required hospitalization: unknown Has patient had a PCN reaction occurring within the last 10 years: no, childhood allergy If all of the above answers are "NO", then may proceed with Cephalosporin use.   . Sulfonamide Derivatives Swelling    Review of Systems: - see above HPI for pertinent positives General:   No F/C, wt loss Pulm:   No DIB, pleuritic chest pain Card:  No CP, palpitations Abd:  No n/v/d or pain Ext:  No inc edema from baseline   Objective:   Blood pressure 126/70, pulse 70, temperature 98 F (36.7 C), height 5' (1.524 m), weight 193 lb 8 oz (87.8 kg), SpO2 97 %. Body mass index is 37.79 kg/m. General: Well Developed, well nourished, appropriate for stated age.  Neuro: Alert and oriented x3, extra-ocular muscles intact, sensation grossly intact.  HEENT: Normocephalic, atraumatic, pupils equal round reactive to light, neck supple, no masses, no painful lymphadenopathy, TM's intact B/L, no acute findings. Nares- patent, clear d/c, OP- clear, mild erythema, No TTP sinuses Skin: Warm and dry, no gross rash. Cardiac: RRR, S1 S2,  no murmurs rubs or gallops.  Respiratory: ECTA B/L and A/P.  One time expiratory wheeze on forced exhalation that cleared after multiple deep breaths.  , Not using accessory muscles, speaking in full sentences- unlabored.  Vascular:  No gross lower ext edema, cap RF less 2 sec. Psych: No HI/SI, judgement and insight good, Euthymic mood. Full Affect.

## 2017-12-27 DIAGNOSIS — G4733 Obstructive sleep apnea (adult) (pediatric): Secondary | ICD-10-CM | POA: Diagnosis not present

## 2017-12-30 DIAGNOSIS — E1165 Type 2 diabetes mellitus with hyperglycemia: Secondary | ICD-10-CM | POA: Diagnosis not present

## 2017-12-30 DIAGNOSIS — E7800 Pure hypercholesterolemia, unspecified: Secondary | ICD-10-CM | POA: Diagnosis not present

## 2017-12-30 DIAGNOSIS — I10 Essential (primary) hypertension: Secondary | ICD-10-CM | POA: Diagnosis not present

## 2017-12-30 DIAGNOSIS — E039 Hypothyroidism, unspecified: Secondary | ICD-10-CM | POA: Diagnosis not present

## 2017-12-30 LAB — HEMOGLOBIN A1C: Hemoglobin A1C: 8.4

## 2017-12-31 DIAGNOSIS — K219 Gastro-esophageal reflux disease without esophagitis: Secondary | ICD-10-CM | POA: Insufficient documentation

## 2018-01-08 ENCOUNTER — Other Ambulatory Visit: Payer: Self-pay | Admitting: Family Medicine

## 2018-01-08 ENCOUNTER — Other Ambulatory Visit: Payer: Self-pay | Admitting: Adult Health

## 2018-01-23 LAB — HM DIABETES EYE EXAM

## 2018-01-27 DIAGNOSIS — G4733 Obstructive sleep apnea (adult) (pediatric): Secondary | ICD-10-CM | POA: Diagnosis not present

## 2018-02-05 ENCOUNTER — Other Ambulatory Visit: Payer: Self-pay | Admitting: Family Medicine

## 2018-02-05 ENCOUNTER — Other Ambulatory Visit: Payer: Self-pay | Admitting: Adult Health

## 2018-02-26 ENCOUNTER — Other Ambulatory Visit: Payer: Self-pay | Admitting: Family Medicine

## 2018-02-26 DIAGNOSIS — G4733 Obstructive sleep apnea (adult) (pediatric): Secondary | ICD-10-CM | POA: Diagnosis not present

## 2018-02-27 ENCOUNTER — Other Ambulatory Visit: Payer: Self-pay

## 2018-02-27 MED ORDER — NITROGLYCERIN 0.4 MG SL SUBL: SUBLINGUAL_TABLET | SUBLINGUAL | 0 refills | Status: DC

## 2018-03-05 ENCOUNTER — Other Ambulatory Visit: Payer: Self-pay | Admitting: Family Medicine

## 2018-03-07 ENCOUNTER — Ambulatory Visit: Payer: PPO | Admitting: Family Medicine

## 2018-03-12 ENCOUNTER — Ambulatory Visit (INDEPENDENT_AMBULATORY_CARE_PROVIDER_SITE_OTHER): Payer: PPO | Admitting: Family Medicine

## 2018-03-12 ENCOUNTER — Encounter: Payer: Self-pay | Admitting: Family Medicine

## 2018-03-12 VITALS — BP 123/72 | HR 66 | Ht 60.0 in | Wt 187.5 lb

## 2018-03-12 DIAGNOSIS — D72819 Decreased white blood cell count, unspecified: Secondary | ICD-10-CM

## 2018-03-12 DIAGNOSIS — I10 Essential (primary) hypertension: Secondary | ICD-10-CM

## 2018-03-12 DIAGNOSIS — I2583 Coronary atherosclerosis due to lipid rich plaque: Secondary | ICD-10-CM | POA: Diagnosis not present

## 2018-03-12 DIAGNOSIS — F32A Depression, unspecified: Secondary | ICD-10-CM

## 2018-03-12 DIAGNOSIS — I2510 Atherosclerotic heart disease of native coronary artery without angina pectoris: Secondary | ICD-10-CM

## 2018-03-12 DIAGNOSIS — E1159 Type 2 diabetes mellitus with other circulatory complications: Secondary | ICD-10-CM | POA: Diagnosis not present

## 2018-03-12 DIAGNOSIS — E1069 Type 1 diabetes mellitus with other specified complication: Secondary | ICD-10-CM

## 2018-03-12 DIAGNOSIS — F329 Major depressive disorder, single episode, unspecified: Secondary | ICD-10-CM | POA: Diagnosis not present

## 2018-03-12 DIAGNOSIS — I5032 Chronic diastolic (congestive) heart failure: Secondary | ICD-10-CM | POA: Diagnosis not present

## 2018-03-12 DIAGNOSIS — E559 Vitamin D deficiency, unspecified: Secondary | ICD-10-CM

## 2018-03-12 DIAGNOSIS — Z794 Long term (current) use of insulin: Secondary | ICD-10-CM

## 2018-03-12 DIAGNOSIS — E782 Mixed hyperlipidemia: Secondary | ICD-10-CM

## 2018-03-12 DIAGNOSIS — IMO0001 Reserved for inherently not codable concepts without codable children: Secondary | ICD-10-CM

## 2018-03-12 DIAGNOSIS — E118 Type 2 diabetes mellitus with unspecified complications: Secondary | ICD-10-CM | POA: Diagnosis not present

## 2018-03-12 LAB — POCT GLYCOSYLATED HEMOGLOBIN (HGB A1C): HbA1c, POC (controlled diabetic range): 8 % — AB (ref 0.0–7.0)

## 2018-03-12 NOTE — Patient Instructions (Addendum)
Just so you know [name], Dr. Chase Caller is a pulmonologist that you would see once a year for care of your lungs, COPD\asthma\interstitial lung disease\sleep apnea.  Dr. Acie Fredrickson also you follow for your congestive heart failure-should be at least once yearly.  Please make sure you are following up with Dr. Soyla Murphy as she has discussed with you for care of your diabetes    Diabetes Mellitus and Standards of Medical Care  Managing diabetes (diabetes mellitus) can be complicated. Your diabetes treatment may be managed by a team of health care providers, including:  A diet and nutrition specialist (registered dietitian).  A nurse.  A certified diabetes educator (CDE).  A diabetes specialist (endocrinologist).  An eye doctor.  A primary care provider.  A dentist.  Your health care providers follow a schedule in order to help you get the best quality of care. The following schedule is a general guideline for your diabetes management plan. Your health care providers may also give you more specific instructions.  HbA1c (hemoglobin A1c) test This test provides information about blood sugar (glucose) control over the previous 2-3 months. It is used to check whether your diabetes management plan needs to be adjusted.  If you are meeting your treatment goals, this test is done at least 2 times a year.  If you are not meeting treatment goals or if your treatment goals have changed, this test is done 4 times a year.  Blood pressure test  This test is done at every routine medical visit. For most people, the goal is less than 130/80. Ask your health care provider what your goal blood pressure should be.  Dental and eye exams  Visit your dentist two times a year.  If you have type 1 diabetes, get an eye exam 3-5 years after you are diagnosed, and then once a year after your first exam. ? If you were diagnosed with type 1 diabetes as a child, get an eye exam when you are age 85 or older and  have had diabetes for 3-5 years. After the first exam, you should get an eye exam once a year.  If you have type 2 diabetes, have an eye exam as soon as you are diagnosed, and then once a year after your first exam.  Foot care exam  Visual foot exams are done at every routine medical visit. The exams check for cuts, bruises, redness, blisters, sores, or other problems with the feet.  A complete foot exam is done by your health care provider once a year. This exam includes an inspection of the structure and skin of your feet, and a check of the pulses and sensation in your feet. ? Type 1 diabetes: Get your first exam 3-5 years after diagnosis. ? Type 2 diabetes: Get your first exam as soon as you are diagnosed.  Check your feet every day for cuts, bruises, redness, blisters, or sores. If you have any of these or other problems that are not healing, contact your health care provider.  Kidney function test (urine microalbumin)  This test is done once a year. ? Type 1 diabetes: Get your first test 5 years after diagnosis. ? Type 2 diabetes: Get your first test as soon as you are diagnosed._  If you have chronic kidney disease (CKD), get a serum creatinine and estimated glomerular filtration rate (eGFR) test once a year.  Lipid profile (cholesterol, HDL, LDL, triglycerides)  This test should be done when you are diagnosed with diabetes, and every  5 years after the first test. If you are on medicines to lower your cholesterol, you may need to get this test done every year. ? The goal for LDL is less than 100 mg/dL (5.5 mmol/L). If you are at high risk, the goal is less than 70 mg/dL (3.9 mmol/L). ? The goal for HDL is 40 mg/dL (2.2 mmol/L) for men and 50 mg/dL(2.8 mmol/L) for women. An HDL cholesterol of 60 mg/dL (3.3 mmol/L) or higher gives some protection against heart disease. ? The goal for triglycerides is less than 150 mg/dL (8.3 mmol/L).  Immunizations  The yearly flu (influenza)  vaccine is recommended for everyone 6 months or older who has diabetes.  The pneumonia (pneumococcal) vaccine is recommended for everyone 2 years or older who has diabetes. If you are 80 or older, you may get the pneumonia vaccine as a series of two separate shots.  The hepatitis B vaccine is recommended for adults shortly after they have been diagnosed with diabetes.  The Tdap (tetanus, diphtheria, and pertussis) vaccine should be given: ? According to normal childhood vaccination schedules, for children. ? Every 10 years, for adults who have diabetes.  The shingles vaccine is recommended for people who have had chicken pox and are 50 years or older.  Mental and emotional health  Screening for symptoms of eating disorders, anxiety, and depression is recommended at the time of diagnosis and afterward as needed. If your screening shows that you have symptoms (you have a positive screening result), you may need further evaluation and be referred to a mental health care provider.  Diabetes self-management education  Education about how to manage your diabetes is recommended at diagnosis and ongoing as needed.  Treatment plan  Your treatment plan will be reviewed at every medical visit.  Summary  Managing diabetes (diabetes mellitus) can be complicated. Your diabetes treatment may be managed by a team of health care providers.  Your health care providers follow a schedule in order to help you get the best quality of care.  Standards of care including having regular physical exams, blood tests, blood pressure monitoring, immunizations, screening tests, and education about how to manage your diabetes.  Your health care providers may also give you more specific instructions based on your individual health.      Type 2 Diabetes Mellitus, Self Care, Adult Caring for yourself after you have been diagnosed with type 2 diabetes (type 2 diabetes mellitus) means keeping your blood sugar  (glucose) under control with a balance of:  Nutrition.  Exercise.  Lifestyle changes.  Medicines or insulin, if necessary.  Support from your team of health care providers and others.  The following information explains what you need to know to manage your diabetes at home. What do I need to do to manage my blood glucose?  Check your blood glucose every day, as often as told by your health care provider.  Contact your health care provider if your blood glucose is above your target for 2 tests in a row.  Have your A1c (hemoglobin A1c) level checked at least two times a year, or as often as told by your health care provider. Your health care provider will set individualized treatment goals for you. Generally, the goal of treatment is to maintain the following blood glucose levels:  Before meals (preprandial): 80-130 mg/dL (4.4-7.2 mmol/L).  After meals (postprandial): below 180 mg/dL (10 mmol/L).  A1c level: less than 7%.  What do I need to know about hyperglycemia and hypoglycemia?  What is hyperglycemia? Hyperglycemia, also called high blood glucose, occurs when blood glucose is too high.Make sure you know the early signs of hyperglycemia, such as:  Increased thirst.  Hunger.  Feeling very tired.  Needing to urinate more often than usual.  Blurry vision.  What is hypoglycemia? Hypoglycemia, also called low blood glucose, occurswith a blood glucose level at or below 70 mg/dL (3.9 mmol/L). The risk for hypoglycemia increases during or after exercise, during sleep, during illness, and when skipping meals or not eating for a long time (fasting). It is important to know the symptoms of hypoglycemia and treat it right away. Always have a 15-gram rapid-acting carbohydrate snack with you to treat low blood glucose. Family members and close friends should also know the symptoms and should understand how to treat hypoglycemia, in case you are not able to treat yourself. What are  the symptoms of hypoglycemia? Hypoglycemia symptoms can include:  Hunger.  Anxiety.  Sweating and feeling clammy.  Confusion.  Dizziness or feeling light-headed.  Sleepiness.  Nausea.  Increased heart rate.  Headache.  Blurry vision.  Seizure.  Nightmares.  Tingling or numbness around the mouth, lips, or tongue.  A change in speech.  Decreased ability to concentrate.  A change in coordination.  Restless sleep.  Tremors or shakes.  Fainting.  Irritability.  How do I treat hypoglycemia?  If you are alert and able to swallow safely, follow the 15:15 rule:  Take 15 grams of a rapid-acting carbohydrate. Rapid-acting options include: ? 1 tube of glucose gel. ? 3 glucose pills. ? 6-8 pieces of hard candy. ? 4 oz (120 mL) of fruit juice. ? 4 oz (120 mL) of regular (not diet) soda.  Check your blood glucose 15 minutes after you take the carbohydrate.  If the repeat blood glucose level is still at or below 70 mg/dL (3.9 mmol/L), take 15 grams of a carbohydrate again.  If your blood glucose level does not increase above 70 mg/dL (3.9 mmol/L) after 3 tries, seek emergency medical care.  After your blood glucose level returns to normal, eat a meal or a snack within 1 hour.  How do I treat severe hypoglycemia? Severe hypoglycemia is when your blood glucose level is at or below 54 mg/dL (3 mmol/L). Severe hypoglycemia is an emergency. Do not wait to see if the symptoms will go away. Get medical help right away. Call your local emergency services (911 in the [place] Do not drive yourself to the hospital. If you have severe hypoglycemia and you cannot eat or drink, you may need an injection of glucagon. A family member or close friend should learn how to check your blood glucose and how to give you a glucagon injection. Ask your health care provider if you need to have an emergency glucagon injection kit available. Severe hypoglycemia may need to be treated in a  hospital. The treatment may include getting glucose through an IV tube. You may also need treatment for the cause of your hypoglycemia. Can having diabetes put me at risk for other conditions? Having diabetes can put you at risk for other long-term (chronic) conditions, such as heart disease and kidney disease. Your health care provider may prescribe medicines to help prevent complications from diabetes. These medicines may include:  Aspirin.  Medicine to lower cholesterol.  Medicine to control blood pressure.  What else can I do to manage my diabetes? Take your diabetes medicines as told  If your health care provider prescribed insulin or diabetes medicines,  take them every day.  Do not run out of insulin or other diabetes medicines that you take. Plan ahead so you always have these available.  If you use insulin, adjust your dosage based on how physically active you are and what foods you eat. Your health care provider will tell you how to adjust your dosage. Make healthy food choices  The things that you eat and drink affect your blood glucose and your insulin dosage. Making good choices helps to control your diabetes and prevent other health problems. A healthy meal plan includes eating lean proteins, complex carbohydrates, fresh fruits and vegetables, low-fat dairy products, and healthy fats. Make an appointment to see a diet and nutrition specialist (registered dietitian) to help you create an eating plan that is right for you. Make sure that you:  Follow instructions from your health care provider about eating or drinking restrictions.  Drink enough fluid to keep your urine clear or pale yellow.  Eat healthy snacks between nutritious meals.  Track the carbohydrates that you eat. Do this by reading food labels and learning the standard serving sizes of foods.  Follow your sick day plan whenever you cannot eat or drink as usual. Make this plan in advance with your health care  provider.  Stay active  Exercise regularly, as told by your health care provider. This may include:  Stretching and doing strength exercises, such as yoga or weightlifting, at least 2 times a week.  Doing at least 150 minutes of moderate-intensity or vigorous-intensity exercise each week. This could be brisk walking, biking, or water aerobics. ? Spread out your activity over at least 3 days of the week. ? Do not go more than 2 days in a row without doing some kind of physical activity.  When you start a new exercise or activity, work with your health care provider to adjust your insulin, medicines, or food intake as needed. Make healthy lifestyle choices  Do not use any tobacco products, such as cigarettes, chewing tobacco, and e-cigarettes. If you need help quitting, ask your health care provider.  If your health care provider says that alcohol is safe for you, limit alcohol intake to no more than 1 drink per day for nonpregnant women and 2 drinks per day for men. One drink equals 12 oz of beer, 5 oz of wine, or 1 oz of hard liquor.  Learn to manage stress. If you need help with this, ask your health care provider. Care for your body   Keep your immunizations up to date. In addition to getting vaccinations as told by your health care provider, it is recommended that you get vaccinated against the following illnesses: ? The flu (influenza). Get a flu shot every year. ? Pneumonia. ? Hepatitis B.  Schedule an eye exam soon after your diagnosis, and then one time every year after that.  Check your skin and feet every day for cuts, bruises, redness, blisters, or sores. Schedule a foot exam with your health care provider once every year.  Brush your teeth and gums two times a day, and floss at least one time a day. Visit your dentist at least once every 6 months.  Maintain a healthy weight. General instructions  Take over-the-counter and prescription medicines only as told by your  health care provider.  Share your diabetes management plan with people in your workplace, school, and household.  Check your urine for ketones when you are ill and as told by your health care provider.  Ask  your health care provider: ? Do I need to meet with a diabetes educator? ? Where can I find a support group for people with diabetes?  Carry a medical alert card or wear medical alert jewelry.  Keep all follow-up visits as told by your health care provider. This is important. Where to find more information: For more information about diabetes, visit:  American Diabetes Association (ADA): www.diabetes.org  American Association of Diabetes Educators (AADE): www.diabeteseducator.org/patient-resources  This information is not intended to replace advice given to you by your health care provider. Make sure you discuss any questions you have with your health care provider. Document Released: 11/28/2015 Document Revised: 01/12/2016 Document Reviewed: 09/09/2015 Elsevier Interactive Patient Education  2017 [place].      Blood Glucose Monitoring, Adult Monitoring your blood sugar (glucose) helps you manage your diabetes. It also helps you and your health care provider determine how well your diabetes management plan is working. Blood glucose monitoring involves checking your blood glucose as often as directed, and keeping a record (log) of your results over time. Why should I monitor my blood glucose? Checking your blood glucose regularly can:  Help you understand how food, exercise, illnesses, and medicines affect your blood glucose.  Let you know what your blood glucose is at any time. You can quickly tell if you are having low blood glucose (hypoglycemia) or high blood glucose (hyperglycemia).  Help you and your health care provider adjust your medicines as needed.  When should I check my blood glucose? Follow instructions from your health care provider about how often to  check your blood glucose.   This may depend on:  The type of diabetes you have.  How well-controlled your diabetes is.  Medicines you are taking.  If you have type 1 diabetes:  Check your blood glucose at least 2 times a day.  Also check your blood glucose: ? Before every insulin injection. ? Before and after exercise. ? Between meals. ? 2 hours after a meal. ? Occasionally between 2:00 a.m. and 3:00 a.m., as directed. ? Before potentially dangerous tasks, like driving or using heavy machinery. ? At bedtime.  You may need to check your blood glucose more often, up to 6-10 times a day: ? If you use an insulin pump. ? If you need multiple daily injections (MDI). ? If your diabetes is not well-controlled. ? If you are ill. ? If you have a history of severe hypoglycemia. ? If you have a history of not knowing when your blood glucose is getting low (hypoglycemia unawareness).  If you have type 2 diabetes:  If you take insulin or other diabetes medicines, check your blood glucose at least 2 times a day.  If you are on intensive insulin therapy, check your blood glucose at least 4 times a day. Occasionally, you may also need to check between 2:00 a.m. and 3:00 a.m., as directed.  Also check your blood glucose: ? Before and after exercise. ? Before potentially dangerous tasks, like driving or using heavy machinery.  You may need to check your blood glucose more often if: ? Your medicine is being adjusted. ? Your diabetes is not well-controlled. ? You are ill.  What is a blood glucose log?  A blood glucose log is a record of your blood glucose readings. It helps you and your health care provider: ? Look for patterns in your blood glucose over time. ? Adjust your diabetes management plan as needed.  Every time you check your blood  glucose, write down your result and notes about things that may be affecting your blood glucose, such as your diet and exercise for the day.  Most  glucose meters store a record of glucose readings in the meter. Some meters allow you to download your records to a computer. How do I check my blood glucose? Follow these steps to get accurate readings of your blood glucose: Supplies needed   Blood glucose meter.  Test strips for your meter. Each meter has its own strips. You must use the strips that come with your meter.  A needle to prick your finger (lancet). Do not use lancets more than once.  A device that holds the lancet (lancing device).  A journal or log book to write down your results.  Procedure  Wash your hands with soap and water.  Prick the side of your finger (not the tip) with the lancet. Use a different finger each time.  Gently rub the finger until a small drop of blood appears.  Follow instructions that come with your meter for inserting the test strip, applying blood to the strip, and using your blood glucose meter.  Write down your result and any notes.  Alternative testing sites  Some meters allow you to use areas of your body other than your finger (alternative sites) to test your blood.  If you think you may have hypoglycemia, or if you have hypoglycemia unawareness, do not use alternative sites. Use your finger instead.  Alternative sites may not be as accurate as the fingers, because blood flow is slower in these areas. This means that the result you get may be delayed, and it may be different from the result that you would get from your finger.  The most common alternative sites are: ? Forearm. ? Thigh. ? Palm of the hand.  Additional tips  Always keep your supplies with you.  If you have questions or need help, all blood glucose meters have a 24-hour "hotline" number that you can call. You may also contact your health care provider.  After you use a few boxes of test strips, adjust (calibrate) your blood glucose meter by following instructions that came with your meter.    The American  Diabetes Association suggests the following targets for most nonpregnant adults with diabetes.  More or less stringent glycemic goals may be appropriate for each individual.  A1C: Less than 7% A1C may also be reported as eAG: Less than 154 mg/dl Before a meal (preprandial plasma glucose): 80-130 mg/dl 1-2 hours after beginning of the meal (Postprandial plasma glucose)*: Less than 180 mg/dl  *Postprandial glucose may be targeted if A1C goals are not met despite reaching preprandial glucose goals.   GOALS in short:  The goals are for the Hgb A1C to be less than 7.0 & blood pressure to be less than 130/80.    It is recommended that all diabetics are educated on and follow a healthy diabetic diet, exercise for 30 minutes 3-4 times per week (walking, biking, swimming, or machine), monitor blood glucose readings and bring that record with you to be reviewed at your next office visit.     You should be checking fasting blood sugars- especially after you eat poorly or eat really healthy, and also check 2 hour postprandial blood sugars after largest meal of the day.    Write these down and bring in your log at each office visit.    You will need to be seen every 3 months by the  provider managing your Diabetes unless told otherwise by that provider.   You will need yearly eye exams from an eye specialist and foot exams to check the nerves of your feet.  Also, your urine should be checked yearly as well to make sure excess protein is not present.   If you are checking your blood pressure at home, please record it and bring it to your next office visit.    Follow the Dietary Approaches to Stop Hypertension (DASH) diet (3 servings of fruit and vegetables daily, whole grains, low sodium, low-fat proteins).  See below.    Lastly, when it comes to your cholesterol, the goal is to have the HDL (good cholesterol) >40, and the LDL (bad cholesterol) <100.   It is recommended that you follow a heart healthy, low  saturated and trans-fat diet and exercise for 30 minutes at least 5 times a week.     (( Check out the DASH diet = 1.5 Gram Low Sodium Diet   A 1.5 gram sodium diet restricts the amount of sodium in the diet to no more than 1.5 g or 1500 mg daily.  The American Heart Association recommends Americans over the age of 50 to consume no more than 1500 mg of sodium each day to reduce the risk of developing high blood pressure.  Research also shows that limiting sodium may reduce heart attack and stroke risk.  Many foods contain sodium for flavor and sometimes as a preservative.  When the amount of sodium in a diet needs to be low, it is important to know what to look for when choosing foods and drinks.  The following includes some information and guidelines to help make it easier for you to adapt to a low sodium diet.    QUICK TIPS  Do not add salt to food.  Avoid convenience items and fast food.  Choose unsalted snack foods.  Buy lower sodium products, often labeled as "lower sodium" or "no salt added."  Check food labels to learn how much sodium is in 1 serving.  When eating at a restaurant, ask that your food be prepared with less salt or none, if possible.    READING FOOD LABELS FOR SODIUM INFORMATION  The nutrition facts label is a good place to find how much sodium is in foods. Look for products with no more than 400 mg of sodium per serving.  Remember that 1.5 g = 1500 mg.  The food label may also list foods as:  Sodium-free: Less than 5 mg in a serving.  Very low sodium: 35 mg or less in a serving.  Low-sodium: 140 mg or less in a serving.  Light in sodium: 50% less sodium in a serving. For example, if a food that usually has 300 mg of sodium is changed to become light in sodium, it will have 150 mg of sodium.  Reduced sodium: 25% less sodium in a serving. For example, if a food that usually has 400 mg of sodium is changed to reduced sodium, it will have 300 mg of sodium.    CHOOSING  FOODS  Grains  Avoid: Salted crackers and snack items. Some cereals, including instant hot cereals. Bread stuffing and biscuit mixes. Seasoned rice or pasta mixes.  Choose: Unsalted snack items. Low-sodium cereals, oats, puffed wheat and rice, shredded wheat. English muffins and bread. Pasta.  Meats  Avoid: Salted, canned, smoked, spiced, pickled meats, including fish and poultry. Bacon, ham, sausage, cold cuts, hot dogs, anchovies.  Choose:  Low-sodium canned tuna and salmon. Fresh or frozen meat, poultry, and fish.  Dairy  Avoid: Processed cheese and spreads. Cottage cheese. Buttermilk and condensed milk. Regular cheese.  Choose: Milk. Low-sodium cottage cheese. Yogurt. Sour cream. Low-sodium cheese.  Fruits and Vegetables  Avoid: Regular canned vegetables. Regular canned tomato sauce and paste. Frozen vegetables in sauces. Olives. Angie Fava. Relishes. Sauerkraut.  Choose: Low-sodium canned vegetables. Low-sodium tomato sauce and paste. Frozen or fresh vegetables. Fresh and frozen fruit.  Condiments  Avoid: Canned and packaged gravies. Worcestershire sauce. Tartar sauce. Barbecue sauce. Soy sauce. Steak sauce. Ketchup. Onion, garlic, and table salt. Meat flavorings and tenderizers.  Choose: Fresh and dried herbs and spices. Low-sodium varieties of mustard and ketchup. Lemon juice. Tabasco sauce. Horseradish.    SAMPLE 1.5 GRAM SODIUM MEAL PLAN:   Breakfast / Sodium (mg)  1 cup low-fat milk / 143 mg  1 whole-wheat English muffin / 240 mg  1 tbs heart-healthy margarine / 153 mg  1 hard-boiled egg / 139 mg  1 small orange / 0 mg  Lunch / Sodium (mg)  1 cup raw carrots / 76 mg  2 tbs no salt added peanut butter / 5 mg  2 slices whole-wheat bread / 270 mg  1 tbs jelly / 6 mg   cup red grapes / 2 mg  Dinner / Sodium (mg)  1 cup whole-wheat pasta / 2 mg  1 cup low-sodium tomato sauce / 73 mg  3 oz lean ground beef / 57 mg  1 small side salad (1 cup raw spinach leaves,  cup cucumber,   cup yellow bell pepper) with 1 tsp olive oil and 1 tsp red wine vinegar / 25 mg  Snack / Sodium (mg)  1 container low-fat vanilla yogurt / 107 mg  3 graham cracker squares / 127 mg  Nutrient Analysis  Calories: 1745  Protein: 75 g  Carbohydrate: 237 g  Fat: 57 g  Sodium: 1425 mg  Document Released: 08/06/2005 Document Revised: 04/18/2011 Document Reviewed: 11/07/2009  ExitCare Patient Information 2012 [place].))    This information is not intended to replace advice given to you by your health care provider. Make sure you discuss any questions you have with your health care provider. Document Released: 08/09/2003 Document Revised: 02/24/2016 Document Reviewed: 01/16/2016 Elsevier Interactive Patient Education  2017 [employer].

## 2018-03-12 NOTE — Progress Notes (Signed)
Impression and Recommendations:    1. Insulin dependent diabetes mellitus with complications ([place])   2. Mixed diabetic hyperlipidemia associated with type 1 diabetes mellitus ([place])   3. Hypertension associated with diabetes ([place])   4. Heart failure, diastolic, chronic ([place])   5. Depression, unspecified depression type   6. Vitamin D deficiency   7. Leukopenia, unspecified type- 3.7, 3.2, 2.5 most recent   8. Hypocalcemia   9. Coronary artery disease due to lipid rich plaque     1. Diabetes Mellitus - A1c stable at 8.0-at goal for age, unchanged since May 2019. - Patient managed by [name]; returns in September for follow-up. - Continue medications as prescribed. - Will continue to monitor. -  Importance of low carb diet discussed with patient in addition to regular exercise.   - Continue to check FBS and 2 hours after the biggest meal of your day.  Keep log and bring in next OV for my review.   Also, if you ever feel poorly, please check your blood pressure and blood sugar, as one or the other could be the cause of your symptoms.  - Being a diabetic, you need yearly eye and foot exams. Make appt for diabetic eye exam.   2. Hypertension - Blood pressure remains controlled at goal at this time. - Continue medications as prescribed. - Patient tolerates medicine well, denies S-E.  - Lifestyle changes such as dash diet and engaging in a regular exercise program discussed with patient.  Educational handouts provided  - Ambulatory BP monitoring encouraged. Keep log and bring in next OV  3. Hyperlipidemia - Continue taking statin as prescribed. - Patient tolerates medicine well, denies S-E.  - Educational handouts provided at patient's desire.  4. Lifestyle - Per patient, recently fell due to tripping on object in path.  To reduce risk of falls, encouraged patient to keep her home clear of obstacles that could cause her to fall, such as loose stools, ends of rugs, items in  pathways, etc.  - Encouraged patient to use cane or other stabilizer at home to assist with ambulation.  5. BMI Counseling Explained to patient what BMI refers to, and what it means medically.    Told patient to think about it as a "medical risk stratification measurement" and how increasing BMI is associated with increasing risk/ or worsening state of various diseases such as hypertension, hyperlipidemia, diabetes, premature OA, depression etc.  American Heart Association guidelines for healthy diet, basically Mediterranean diet, and exercise guidelines of 30 minutes 5 days per week or more discussed in detail.  Health counseling performed.  All questions answered.  6. Lifestyle & Preventative Health Maintenance - Advised patient to continue working toward exercising to improve overall mental, physical, and emotional health.    - Engage in daily physical activity as tolerated.  Strongly recommended that the patient begin an exercise routine per her limitations.  Recommended that the patient eventually strive for at least 150 minutes of moderate cardiovascular activity per week according to guidelines established by the [facility].   - Healthy dietary habits encouraged, including low-carb, and high amounts of lean protein in diet.   - Patient should also consume adequate amounts of water - half of body weight in oz of water per day.   Education and routine counseling performed. Handouts provided.  7. Follow-Up - Reviewed care team with patient and encouraged patient to continue following up with her specialists for targeted care.  - Patient will return in  near future for medicare wellness visit.  - Strongly encouraged patient to continue to return to clinic for regularly scheduled chronic follow-up.    Orders Placed This Encounter  Procedures  . CBC with Differential/Platelet  . Comprehensive metabolic panel  . VITAMIN D 25 Hydroxy (Vit-D Deficiency, Fractures)  . TSH  . T4, free  .  Vitamin B12  . Lipid Panel w/reflex Direct LDL  . Lipid panel  . POCT glycosylated hemoglobin (Hb A1C)  . HM Diabetes Foot Exam     Return for 56mofor BP, DM, chol, mood etc.   The patient was counseled, risk factors were discussed, anticipatory guidance given.  Gross side effects, risk and benefits, and alternatives of medications discussed with patient.  Patient is aware that all medications have potential side effects and we are unable to predict every side effect or drug-drug interaction that may occur.  Expresses verbal understanding and consents to current therapy plan and treatment regimen.  Please see AVS handed out to patient at the end of our visit for further patient instructions/ counseling done pertaining to today's office visit.    Note: This document was prepared using Dragon voice recognition software and may include unintentional dictation errors.  This document serves as a record of services personally performed by DMellody Dance DO. It was created on her behalf by KToni Amend a trained medical scribe. The creation of this record is based on the scribe's personal observations and the provider's statements to them.   I have reviewed the above medical documentation for accuracy and completeness and I concur.  DMellody Dance07/24/19 2:57 PM     Subjective:    Chief Complaint  Patient presents with  . Follow-up    [place] GLADUEis a 84y.o. female [facility].    Recent Fall She experienced a fall the first of July; hurt her neck and back, but she's feeling better.  She states that she tripped and fell over an object, causing her to fall.  Patient states that she has a cane at home, but does not commonly use it.  Other Health States she drinks about 5 glasses of water per day.  States her vision isn't as good, but she has her diabetic eye exam coming up.  DM HPI: -  She  has not been working on diet and exercise for diabetes. States she cannot walk "real real far."  Pt is currently maintained on the following medications for diabetes:   see med list today Medication compliance - compliant with treatment plan.  Taking Lantus 60 units PM, and 30 units AM.  Using 30 units Novolog TID with meals. Patient uses a scale to determine how many units to use. She was provided this scale by her nutritionist.  Home glucose readings range: Fasting blood sugar running around 119 "When I take them, they've been good."  Denies low blood sugar.  Denies polyuria/polydipsia. Denies hypo/ hyperglycemia symptoms - She denies new onset of: chest pain, exercise intolerance, shortness of breath, dizziness, visual changes, headache, lower extremity swelling or claudication.   Last diabetic eye exam was  Lab Results  Component Value Date   HMDIABEYEEXA Retinopathy (A) 04/30/2016   Foot exam- UTD  Last A1C in the office was:  Lab Results  Component Value Date   HGBA1C 8.0 (A) 03/12/2018   HGBA1C 9.2 (H) 05/27/2017   HGBA1C 9.2 05/22/2017    Lab Results  Component Value Date   MICROALBUR 10 05/22/2017   LDLCALC 102 (H) 05/27/2017   CREATININE 0.86 05/27/2017    1. HT HPI:  -  Her blood pressure has been controlled at home.  Pt is checking it at home.  States that it's running around 120/70 at home.  Continues lasix daily.  - Patient reports good compliance with blood pressure medications  - Denies medication S-E   - Smoking Status noted   - She denies new onset of: chest pain, exercise intolerance, shortness of breath, dizziness, visual changes, headache, lower extremity swelling or claudication.   Last 3 blood pressure readings in our office are as follows: BP Readings from Last 3 Encounters:  03/12/18 123/72  12/04/17 126/70  07/24/17 (!) 121/55    Filed Weights   03/12/18 0917  Weight: 187 lb 8 oz (85 kg)    2. 84 y.o. female here for  cholesterol follow-up.   - Patient reports good compliance with medications or treatment plan  - Denies medication S-E.  Continues Zocor as prescribed.   - Smoking Status noted   - She denies new onset of: chest pain, exercise intolerance, shortness of breath, dizziness, visual changes, headache, lower extremity swelling or claudication.   Denies myalgias.  The cholesterol last visit was:  Lab Results  Component Value Date   CHOL 183 05/27/2017   HDL 43 05/27/2017   LDLCALC 102 (H) 05/27/2017   TRIG 188 (H) 05/27/2017   CHOLHDL 4.3 05/27/2017    Hepatic Function Latest Ref Rng & Units 05/27/2017 05/02/2016 03/20/2016  Total Protein 6.0 - 8.5 g/dL 6.6 6.8 7.0  Albumin 3.5 - 4.7 g/dL 3.4(L) 3.5(L) 3.5(L)  AST 0 - 40 IU/L _0 ALT 0 - 32 IU/L _1 Alk Phosphatase 39 - 117 IU/L 191(H) 207(H) 192(H)  Total Bilirubin 0.0 - 1.2 mg/dL 0.5 0.8 0.8  Bilirubin, Direct 0.0 - 0.3 mg/dL - - -      Last 3 blood pressure readings in our office are as follows: BP Readings from Last 3 Encounters:  03/12/18 123/72  12/04/17 126/70  07/24/17 (!) 121/55    BMI Readings from Last 3 Encounters:  03/12/18 36.62 kg/m  12/04/17 37.79 kg/m  07/24/17 36.72 kg/m     Problem  Hypocalcemia      Patient Care Team    Relationship Specialty Notifications Start End  Mellody Dance, DO PCP - General Family Medicine  03/12/16   Brand Males, MD Attending Physician Pulmonary Disease Abnormal results only, Admissions 12/07/12   Nahser, Wonda Cheng, MD Consulting Physician Cardiology  05/01/16   Jackquline Denmark, MD Referring Physician Gastroenterology  05/01/16   Jacelyn Pi, MD Consulting Physician Endocrinology  05/22/17      Patient Active Problem List   Diagnosis Date Noted  . Hypertension associated with diabetes ([place]) 05/22/2017    Priority: High  . Obesity, Class I, BMI 30-34.9 06/23/2016    Priority: High  . Hypothyroidism 03/13/2016    Priority: High  . Insulin  dependent diabetes mellitus with complications ([place]) 48/18/5631    Priority: High  . Mixed diabetic hyperlipidemia associated with type 1 diabetes mellitus ([place]) 03/13/2016    Priority: High  . Depression 03/13/2016    Priority: Medium  . Coronary artery disease 04/11/2011    Priority: Medium  . COPD (chronic obstructive pulmonary disease) ([place]) 04/11/2011    Priority: Medium  . Obstructive sleep apnea 04/11/2011    Priority: Medium  . Heart failure, diastolic,  chronic ([facility]) 03/29/2008    Priority: Medium  . Vitamin D deficiency 05/11/2016    Priority: Low  . Atherosclerosis of carotid arteries 04/03/2006    Priority: Low  . Hypocalcemia 03/12/2018  . Gastroesophageal reflux disease 12/31/2017  . chronic Low platelet count 06/05/2017  . chronic Leukopenia 05/11/2016  . Elevated serum alkaline phosphatase level- hepatocellular in origin 05/11/2016  . Constipation, chronic 03/13/2016  . At high risk for osteoporosis 03/13/2016  . Hypotension 12/13/2012  . Hypokalemia 12/13/2012  . Thrombocytopenia, unspecified ([facility]) 12/13/2012  . h/o Clostridium difficile colitis 12/12/2012  . History of GI bleed 12/12/2012  . ILD (interstitial lung disease) ([facility]) 11/25/2012     Past Medical History:  Diagnosis Date  . Cirrhosis, non-alcoholic ([facility])   . Coronary artery disease    status post inferior wall myocardial infarction  . Diabetes mellitus   . Dyslipidemia   . Hypotension 08/02/2010   recent episodes of orthostatic hypotension  . Hypothyroidism   . Leg edema   . Myocardial infarct, old   . Neuropathy of hand    left hand numbness/neuropathy  . Obesity   . Sleep apnea      Past Surgical History:  Procedure Laterality Date  . ABDOMINAL HYSTERECTOMY    . CARDIAC CATHETERIZATION     The ejection fraction is around 50%  . CHOLECYSTECTOMY    . CORONARY STENT PLACEMENT    . ESOPHAGOGASTRODUODENOSCOPY N/A 12/12/2012   Procedure: ESOPHAGOGASTRODUODENOSCOPY (EGD);  Surgeon:  Robert V Buccini, MD;  Location: [facility];  Service: Endoscopy;  Laterality: N/A;  . FLEXIBLE SIGMOIDOSCOPY N/A 12/12/2012   Procedure: FLEXIBLE SIGMOIDOSCOPY;  Surgeon: Robert V Buccini, MD;  Location: [facility];  Service: Endoscopy;  Laterality: N/A;  . TUBAL LIGATION    . WRIST SURGERY       Family History  Problem Relation Age of Onset  . Emphysema Brother        non smoker  . Diabetes Brother   . Hyperlipidemia Brother   . Hypertension Brother   . Heart disease Father   . Heart attack Father   . Diabetes Father   . Hyperlipidemia Father   . Hypertension Father   . Diabetes Mother   . Diabetes Sister   . Hyperlipidemia Sister   . Hypertension Sister   . Heart attack Daughter   . Diabetes Daughter   . Diabetes Son   . Suicidality Son   . Alcohol abuse Son   . Alcohol abuse Maternal Uncle   . Heart attack Paternal Grandfather   . Diabetes Daughter   . Cancer Son        melanoma  . Diabetes Son   . Diabetes Son      Social History   Substance and Sexual Activity  Drug Use No  ,  Social History   Substance and Sexual Activity  Alcohol Use No  ,  Social History   Tobacco Use  Smoking Status Former Smoker  . Last attempt to quit: 08/20/1973  . Years since quitting: 44.5  Smokeless Tobacco Never Used  ,    Current Outpatient Medications on File Prior to Visit  Medication Sig Dispense Refill  . Blood Glucose Monitoring Suppl (FREESTYLE LITE) DEVI Use to check blood sugars 2-3 times daily 1 each 0  . Calcium Carbonate-Vitamin D 600-400 MG-UNIT tablet Please get a calcium supplement tablet over-the-counter to equal 1200 mg of calcium per day 60 tablet 11  . carvedilol (COREG) 3.125 MG tablet TAKE ONE TABLET BY   MOUTH TWO TIMES DAILY WITH A MEAL. 180 tablet 1  . cycloSPORINE (RESTASIS) 0.05 % ophthalmic emulsion Place 1 drop into both eyes 2 (two) times daily.    . EASY TOUCH PEN NEEDLES 31G X 5 MM MISC USE TO INJECT INSULIN 5 TIMES A DAY 200 each 11  .  escitalopram (LEXAPRO) 20 MG tablet Take 1 tablet (20 mg total) by mouth daily. 90 tablet 3  . fexofenadine (ALLEGRA) 180 MG tablet Take 1 tablet (180 mg total) by mouth daily. 90 tablet 3  . Fluticasone-Salmeterol (ADVAIR DISKUS) 250-50 MCG/DOSE AEPB INHALE 1 PUFF BY MOUTH TWICE DAILY 60 each 0  . furosemide (LASIX) 40 MG tablet TAKE 1/2 A TABLET (20 MG TOTAL) BY MOUTH DAILY. 45 tablet 2  . glucose blood (FREESTYLE LITE) test strip Test 3 to 4 times daily 120 each 12  . LANTUS SOLOSTAR 100 UNIT/ML Solostar Pen INJECT 64 UNITS SUBCUTANEOUSLY EVERY NIGHT AT BEDTIME AND 30 UNITS 12 HOURS LATER. 24 pen 1  . levothyroxine (SYNTHROID, LEVOTHROID) 112 MCG tablet TAKE 1 TABLET EVERY MORNING ON AN EMPTY STOMACH ONCE A DAY 90 tablet 1  . montelukast (SINGULAIR) 10 MG tablet TAKE 1 TABLET BY MOUTH EVERY EVENING. 90 tablet 0  . nitroGLYCERIN (NITROSTAT) 0.4 MG SL tablet Place 1 tablet under the tongue every 5 minutes as needed for chest pain. Patient needs an appointment for further refills 2nd attempt 15 tablet 0  . NOVOLOG FLEXPEN 100 UNIT/ML FlexPen INJECT 20 UNITS SUBCUTANEOUSLY THREE TIMES A DAY BEFORE MEALS; ADJUST AND INCREASE ACCORDINGLY. 18 pen 0  . ONE TOUCH ULTRA TEST test strip TEST BLOOD SUGAR 2-3 TIMES A DAY 100 each 11  . ONETOUCH DELICA LANCETS 33G MISC USE TO CHECK BLOOD SUGAR THREE TIMES A DAY 100 each 10  . pantoprazole (PROTONIX) 20 MG tablet TAKE ONE TABLET BY MOUTH TWICE DAILY BEFORE A MEAL 90 tablet 2  . potassium chloride (K-DUR,KLOR-CON) 10 MEQ tablet TAKE ONE TABLET BY MOUTH FOUR TIMES DAILY 360 tablet 0  . potassium chloride (KLOR-CON) 10 MEQ CR tablet Take 10 mEq by mouth 2 (two) times daily.     . predniSONE (DELTASONE) 20 MG tablet Take 2 tabs po * 2 days, then 1 tabs for 2 d, then 1/2 tab 2 d 10 tablet 0  . PROAIR HFA 108 (90 Base) MCG/ACT inhaler INHALE 2 PUFFS BY MOUTH EVERY 6 HOURS AS NEEDED. PT MUST HAVE OFFICE VISIT FOR MORE REFILLS 8.5 each 0  . simvastatin (ZOCOR) 10 MG  tablet TAKE 1 TABLET BY MOUTH EVERY EVENING 30 tablet 11  . tiotropium (SPIRIVA HANDIHALER) 18 MCG inhalation capsule INHALE THE CONTENTS OF 1 CAPSULE VIA HANDIHALER BY MOUTH EVERY DAY 90 capsule 1  . Vitamin D, Ergocalciferol, (DRISDOL) 50000 units CAPS capsule TAKE 1 CAPSULE (50,000 UNITS TOTAL) BY MOUTH EVERY 7 (SEVEN) DAYS. 12 capsule 1  . White Petrolatum-Mineral Oil (SYSTANE NIGHTTIME) OINT Place 1 application into both eyes at bedtime as needed (irritation).      No current facility-administered medications on file prior to visit.      Allergies  Allergen Reactions  . Ace Inhibitors Cough  . Benadryl [Diphenhydramine Hcl (Sleep)] Other (See Comments)    hypotension  . Penicillins Hives and Swelling    Swelling of arms Has patient had a PCN reaction causing immediate rash, facial/tongue/throat swelling, SOB or lightheadedness with hypotension: unknown Has patient had a PCN reaction causing severe rash involving mucus membranes or skin necrosis: no Has patient had a   PCN reaction that required hospitalization: unknown Has patient had a PCN reaction occurring within the last 10 years: no, childhood allergy If all of the above answers are "NO", then may proceed with Cephalosporin use.   . Sulfonamide Derivatives Swelling     Review of Systems:   General:  Denies fever, chills Optho/Auditory:   Denies visual changes, blurred vision Respiratory:   Denies SOB, cough, wheeze, DIB  Cardiovascular:   Denies chest pain, palpitations, painful respirations Gastrointestinal:   Denies nausea, vomiting, diarrhea.  Endocrine:     Denies new hot or cold intolerance Musculoskeletal:  Denies joint swelling, gait issues, or new unexplained myalgias/ arthralgias Skin:  Denies rash, suspicious lesions  Neurological:    Denies dizziness, unexplained weakness, numbness  Psychiatric/Behavioral:   Denies mood changes    Objective:     Blood pressure 123/72, pulse 66, height 5' (1.524 m), weight  187 lb 8 oz (85 kg), SpO2 96 %.  Body mass index is 36.62 kg/m.  General: Well Developed, well nourished, and in no acute distress.  HEENT: Normocephalic, atraumatic, pupils equal round reactive to light, neck supple, No carotid bruits, no JVD Skin: Warm and dry, cap RF less 2 sec Cardiac: Regular rate and rhythm, S1, S2 WNL's, no murmurs rubs or gallops Respiratory: ECTA B/L, Not using accessory muscles, speaking in full sentences. NeuroM-Sk: Ambulates w/o assistance, moves ext * 4 w/o difficulty, sensation grossly intact.  Ext: scant edema b/l lower ext Psych: No HI/SI, judgement and insight good, Euthymic mood. Full Affect.

## 2018-03-13 DIAGNOSIS — E10311 Type 1 diabetes mellitus with unspecified diabetic retinopathy with macular edema: Secondary | ICD-10-CM | POA: Diagnosis not present

## 2018-03-13 DIAGNOSIS — H0100A Unspecified blepharitis right eye, upper and lower eyelids: Secondary | ICD-10-CM | POA: Diagnosis not present

## 2018-03-13 DIAGNOSIS — H35373 Puckering of macula, bilateral: Secondary | ICD-10-CM | POA: Diagnosis not present

## 2018-03-13 DIAGNOSIS — H0100B Unspecified blepharitis left eye, upper and lower eyelids: Secondary | ICD-10-CM | POA: Diagnosis not present

## 2018-03-13 DIAGNOSIS — H11423 Conjunctival edema, bilateral: Secondary | ICD-10-CM | POA: Diagnosis not present

## 2018-03-13 DIAGNOSIS — H4321 Crystalline deposits in vitreous body, right eye: Secondary | ICD-10-CM | POA: Diagnosis not present

## 2018-03-13 DIAGNOSIS — Z961 Presence of intraocular lens: Secondary | ICD-10-CM | POA: Diagnosis not present

## 2018-03-13 DIAGNOSIS — H16143 Punctate keratitis, bilateral: Secondary | ICD-10-CM | POA: Diagnosis not present

## 2018-03-13 DIAGNOSIS — H3589 Other specified retinal disorders: Secondary | ICD-10-CM | POA: Diagnosis not present

## 2018-03-13 DIAGNOSIS — H11153 Pinguecula, bilateral: Secondary | ICD-10-CM | POA: Diagnosis not present

## 2018-03-13 DIAGNOSIS — H40013 Open angle with borderline findings, low risk, bilateral: Secondary | ICD-10-CM | POA: Diagnosis not present

## 2018-03-13 DIAGNOSIS — H18413 Arcus senilis, bilateral: Secondary | ICD-10-CM | POA: Diagnosis not present

## 2018-03-13 LAB — CBC WITH DIFFERENTIAL/PLATELET
Basophils Absolute: 0 10*3/uL (ref 0.0–0.2)
Basos: 1 %
EOS (ABSOLUTE): 0.1 10*3/uL (ref 0.0–0.4)
Eos: 3 %
Hematocrit: 37.8 % (ref 34.0–46.6)
Hemoglobin: 11.6 g/dL (ref 11.1–15.9)
Immature Grans (Abs): 0 10*3/uL (ref 0.0–0.1)
Immature Granulocytes: 0 %
Lymphocytes Absolute: 0.5 10*3/uL — ABNORMAL LOW (ref 0.7–3.1)
Lymphs: 18 %
MCH: 24.6 pg — ABNORMAL LOW (ref 26.6–33.0)
MCHC: 30.7 g/dL — ABNORMAL LOW (ref 31.5–35.7)
MCV: 80 fL (ref 79–97)
Monocytes Absolute: 0.4 10*3/uL (ref 0.1–0.9)
Monocytes: 16 %
Neutrophils Absolute: 1.7 10*3/uL (ref 1.4–7.0)
Neutrophils: 62 %
Platelets: 62 10*3/uL — CL (ref 150–450)
RBC: 4.72 x10E6/uL (ref 3.77–5.28)
RDW: 15.9 % — ABNORMAL HIGH (ref 12.3–15.4)
WBC: 2.8 10*3/uL — ABNORMAL LOW (ref 3.4–10.8)

## 2018-03-13 LAB — LIPID PANEL
Chol/HDL Ratio: 3.1 ratio (ref 0.0–4.4)
Cholesterol, Total: 154 mg/dL (ref 100–199)
HDL: 50 mg/dL (ref >39)
LDL Calculated: 75 mg/dL (ref 0–99)
Triglycerides: 145 mg/dL (ref 0–149)
VLDL Cholesterol Cal: 29 mg/dL (ref 5–40)

## 2018-03-13 LAB — T4, FREE: Free T4: 1.52 ng/dL (ref 0.82–1.77)

## 2018-03-13 LAB — VITAMIN B12: Vitamin B-12: 427 pg/mL (ref 232–1245)

## 2018-03-13 LAB — COMPREHENSIVE METABOLIC PANEL
ALT: 13 IU/L (ref 0–32)
AST: 25 IU/L (ref 0–40)
Albumin/Globulin Ratio: 0.9 — ABNORMAL LOW (ref 1.2–2.2)
Albumin: 3.3 g/dL — ABNORMAL LOW (ref 3.5–4.7)
Alkaline Phosphatase: 174 IU/L — ABNORMAL HIGH (ref 39–117)
BUN/Creatinine Ratio: 7 — ABNORMAL LOW (ref 12–28)
BUN: 6 mg/dL — ABNORMAL LOW (ref 8–27)
Bilirubin Total: 0.7 mg/dL (ref 0.0–1.2)
CO2: 25 mmol/L (ref 20–29)
Calcium: 8.9 mg/dL (ref 8.7–10.3)
Chloride: 104 mmol/L (ref 96–106)
Creatinine, Ser: 0.88 mg/dL (ref 0.57–1.00)
GFR calc Af Amer: 71 mL/min/{1.73_m2} (ref >59)
GFR calc non Af Amer: 62 mL/min/{1.73_m2} (ref >59)
Globulin, Total: 3.5 g/dL (ref 1.5–4.5)
Glucose: 111 mg/dL — ABNORMAL HIGH (ref 65–99)
Potassium: 4.5 mmol/L (ref 3.5–5.2)
Sodium: 139 mmol/L (ref 134–144)
Total Protein: 6.8 g/dL (ref 6.0–8.5)

## 2018-03-13 LAB — VITAMIN D 25 HYDROXY (VIT D DEFICIENCY, FRACTURES): Vit D, 25-Hydroxy: 42.3 ng/mL (ref 30.0–100.0)

## 2018-03-13 LAB — TSH: TSH: 0.329 u[IU]/mL — ABNORMAL LOW (ref 0.450–4.500)

## 2018-03-14 ENCOUNTER — Ambulatory Visit: Payer: PPO | Admitting: Family Medicine

## 2018-03-24 ENCOUNTER — Encounter (INDEPENDENT_AMBULATORY_CARE_PROVIDER_SITE_OTHER): Payer: PPO | Admitting: Ophthalmology

## 2018-03-24 DIAGNOSIS — H5202 Hypermetropia, left eye: Secondary | ICD-10-CM | POA: Diagnosis not present

## 2018-03-24 DIAGNOSIS — H52222 Regular astigmatism, left eye: Secondary | ICD-10-CM | POA: Diagnosis not present

## 2018-03-27 ENCOUNTER — Encounter (INDEPENDENT_AMBULATORY_CARE_PROVIDER_SITE_OTHER): Payer: PPO | Admitting: Ophthalmology

## 2018-03-27 DIAGNOSIS — E113593 Type 2 diabetes mellitus with proliferative diabetic retinopathy without macular edema, bilateral: Secondary | ICD-10-CM | POA: Diagnosis not present

## 2018-03-27 DIAGNOSIS — E11319 Type 2 diabetes mellitus with unspecified diabetic retinopathy without macular edema: Secondary | ICD-10-CM

## 2018-03-27 DIAGNOSIS — H43813 Vitreous degeneration, bilateral: Secondary | ICD-10-CM

## 2018-03-27 DIAGNOSIS — I10 Essential (primary) hypertension: Secondary | ICD-10-CM | POA: Diagnosis not present

## 2018-03-27 DIAGNOSIS — H35033 Hypertensive retinopathy, bilateral: Secondary | ICD-10-CM

## 2018-04-02 ENCOUNTER — Ambulatory Visit (INDEPENDENT_AMBULATORY_CARE_PROVIDER_SITE_OTHER): Payer: PPO | Admitting: Family Medicine

## 2018-04-02 DIAGNOSIS — Z5329 Procedure and treatment not carried out because of patient's decision for other reasons: Secondary | ICD-10-CM

## 2018-04-02 NOTE — Progress Notes (Signed)
Error - no show

## 2018-04-03 ENCOUNTER — Other Ambulatory Visit: Payer: Self-pay | Admitting: Family Medicine

## 2018-04-30 ENCOUNTER — Other Ambulatory Visit: Payer: Self-pay | Admitting: Family Medicine

## 2018-05-05 DIAGNOSIS — E7800 Pure hypercholesterolemia, unspecified: Secondary | ICD-10-CM | POA: Diagnosis not present

## 2018-05-05 DIAGNOSIS — Z139 Encounter for screening, unspecified: Secondary | ICD-10-CM | POA: Diagnosis not present

## 2018-05-05 DIAGNOSIS — Z23 Encounter for immunization: Secondary | ICD-10-CM | POA: Diagnosis not present

## 2018-05-05 DIAGNOSIS — E1165 Type 2 diabetes mellitus with hyperglycemia: Secondary | ICD-10-CM | POA: Diagnosis not present

## 2018-05-05 DIAGNOSIS — I10 Essential (primary) hypertension: Secondary | ICD-10-CM | POA: Diagnosis not present

## 2018-05-05 DIAGNOSIS — E039 Hypothyroidism, unspecified: Secondary | ICD-10-CM | POA: Diagnosis not present

## 2018-05-05 LAB — HEMOGLOBIN A1C: Hemoglobin A1C: 7

## 2018-05-13 DIAGNOSIS — M8580 Other specified disorders of bone density and structure, unspecified site: Secondary | ICD-10-CM | POA: Diagnosis not present

## 2018-05-21 ENCOUNTER — Other Ambulatory Visit: Payer: Self-pay | Admitting: Family Medicine

## 2018-05-21 DIAGNOSIS — F329 Major depressive disorder, single episode, unspecified: Secondary | ICD-10-CM

## 2018-05-21 DIAGNOSIS — F32A Depression, unspecified: Secondary | ICD-10-CM

## 2018-05-22 ENCOUNTER — Other Ambulatory Visit: Payer: Self-pay

## 2018-05-22 MED ORDER — NITROGLYCERIN 0.4 MG SL SUBL: SUBLINGUAL_TABLET | SUBLINGUAL | 0 refills | Status: DC

## 2018-05-22 NOTE — Addendum Note (Signed)
Addended by: Louanne Belton, Cardin Nitschke A on: 05/22/2018 04:10 PM   Modules accepted: Orders

## 2018-06-12 ENCOUNTER — Ambulatory Visit: Payer: PPO | Admitting: Family Medicine

## 2018-06-25 ENCOUNTER — Other Ambulatory Visit: Payer: Self-pay | Admitting: Family Medicine

## 2018-06-25 ENCOUNTER — Other Ambulatory Visit: Payer: Self-pay | Admitting: Cardiovascular Disease

## 2018-06-25 DIAGNOSIS — I10 Essential (primary) hypertension: Principal | ICD-10-CM

## 2018-06-25 DIAGNOSIS — E1159 Type 2 diabetes mellitus with other circulatory complications: Secondary | ICD-10-CM

## 2018-07-14 ENCOUNTER — Encounter: Payer: Self-pay | Admitting: Family Medicine

## 2018-07-14 ENCOUNTER — Ambulatory Visit (INDEPENDENT_AMBULATORY_CARE_PROVIDER_SITE_OTHER): Payer: PPO | Admitting: Family Medicine

## 2018-07-14 VITALS — BP 101/66 | HR 55 | Temp 97.5°F | Ht 60.0 in | Wt 179.9 lb

## 2018-07-14 DIAGNOSIS — M62838 Other muscle spasm: Secondary | ICD-10-CM | POA: Diagnosis not present

## 2018-07-14 DIAGNOSIS — E1169 Type 2 diabetes mellitus with other specified complication: Secondary | ICD-10-CM

## 2018-07-14 DIAGNOSIS — M545 Low back pain, unspecified: Secondary | ICD-10-CM

## 2018-07-14 LAB — POCT GLYCOSYLATED HEMOGLOBIN (HGB A1C): Hemoglobin A1C: 8.4 % — AB (ref 4.0–5.6)

## 2018-07-14 MED ORDER — CYCLOBENZAPRINE HCL 10 MG PO TABS: 10.0000 mg | ORAL_TABLET | Freq: Three times a day (TID) | ORAL | 0 refills | Status: DC | PRN

## 2018-07-14 NOTE — Progress Notes (Signed)
Impression and Recommendations:    1. Acute right-sided low back pain without sciatica   2. Muscle spasms of neck and back R>er L   3. Morbidly obese (HCC)- BMI >er 35 w DM   4. Type 2 diabetes mellitus with other specified complication, without long-term current use of insulin (HCC)     1. Muscle spasm in R Back - Extensive education provided to patient today regarding her muscle spasms and the likely sources of her pain.  - Advised patient to walk on a flat, forgiving surface.  Encouraged patient to find an indoor walking track to help increase blood flow to the muscles, help them to relax, and help to alleviate patient's muscle tension.  - Strongly encouraged patient to practice stretching on each side of her body.  Reviewed that worst thing to do is sit around or lay around.  - Advised patient that best treatment would be referral to physical therapy, to work on stretching the body, stretching the legs, and helping her muscles to relax.  Patient declines at this time.  - Reviewed that muscle relaxers are risky/not advised given patient's age, however we may prescribe if patient agrees to guidelines reviewed in office today.    - Risks and benefits of flexeril use reviewed at length with patient today.  - With flexeril, advised patient to take extra care, start on a half of a tablet, and if she does not feel too "drunk & drowsy," increase to a full tablet.  This should begin to help her sleep soundly at night and help her muscles relax.  Strongly advised patient to use her walker at home, especially while managed on flexeril.  - Patient knows to monitor her S-E while on flexeril and knows to be extra careful while ambulating.  - Avoid twisting, lifting, mopping, pushing a vacuum cleaner around.  - Apply a heating pad to the area PRN.  - Reviewed that this should be resolved in 2-3 weeks.  Do not lift heavy things, including foods out of the oven.   Education and routine  counseling performed. Handouts provided.  2. Follow-Up - A1c is 8.4 today.  Patient has an upcoming appointment in December for follow up with chronic care - diabetes, bp, etc.   Return for (2) DM, HTN, HLD follow up every 29mo- NEXT APPT DEC 12th- be here 1:30pm.   Gross side effects, risk and benefits, and alternatives of medications discussed with patient.  Patient is aware that all medications have potential side effects and we are unable to predict every sideeffect or drug-drug interaction that may occur.  Expresses verbal understanding and consents to current therapy plan and treatment regiment.  Please see AVS handed out to patient at the end of our visit for further patient instructions/ counseling done pertaining to today's office visit.   This document serves as a record of services personally performed by Thomasene Loteborah Tamura Lasky, DO. It was created on her behalf by Peggye FothergillKatherine Galloway, a trained medical scribe. The creation of this record is based on the scribe's personal observations and the provider's statements to them.   I have reviewed the above medical documentation for accuracy and completeness and I concur.  Thomasene Loteborah Jarvin Ogren, DO 07/14/2018 8:40 PM       ----------------------------------------------------------------------------------  Subjective:   CC: BACK PAIN  Problem  Morbidly obese (HCC)- BMI >er 35 w DM    Back pain is worse than her neck.  States she has trouble sleeping at times.  Has been sitting around more often "because I couldn't get up and move."  Location: Right lower back. . Quality: Off and on.  "Getting worse instead of better."  "It just grabs me.  I can't walk." Onset: Two weeks ago, worse in the past week. Radiation: Denies radiation. Trauma: Denies trauma; denies pulling out Christmas decorations, cleaning the house or doing a bunch of activities.   Red Flags Fecal/urinary incontinence: no  Numbness/Weakness: no  Fever/chills/sweats: no    Night pain: no  Unexplained weight loss: no  No relief with bedrest: no  h/o cancer/immunosuppression: no but is diabetic IV drug use: no  PMH of osteoporosis or chronic steroid use: no    Patient Care Team    Relationship Specialty Notifications Start End  Thomasene Lot, DO PCP - General Family Medicine  03/12/16   Kalman Shan, MD Attending Physician Pulmonary Disease Abnormal results only, Admissions 12/07/12   Nahser, Deloris Ping, MD Consulting Physician Cardiology  05/01/16   Lynann Bologna, MD Referring Physician Gastroenterology  05/01/16   Dorisann Frames, MD Consulting Physician Endocrinology  05/22/17     The following portions of the patient's history were reviewed and updated as appropriate: allergies, current medications, past family history, past medical history, past social history, past surgical history and problem list.  Previous Medications   ARTIFICIAL TEAR SOLUTION (GENTEAL TEARS OP)    Apply to eye at bedtime.   BLOOD GLUCOSE MONITORING SUPPL (FREESTYLE LITE) DEVI    Use to check blood sugars 2-3 times daily   CALCIUM CARBONATE-VITAMIN D 600-400 MG-UNIT TABLET    Please get a calcium supplement tablet over-the-counter to equal 1200 mg of calcium per day   CARBOXYMETH-GLYC-POLYSORB PF (REFRESH OPTIVE MEGA-3) 0.5-1-0.5 % SOLN    Apply 2 drops to eye 2 (two) times daily.   CARVEDILOL (COREG) 3.125 MG TABLET    TAKE ONE TABLET BY MOUTH TWO TIMES DAILY WITH A MEAL   CYCLOSPORINE (RESTASIS) 0.05 % OPHTHALMIC EMULSION    Place 1 drop into both eyes 2 (two) times daily.   EASY TOUCH PEN NEEDLES 31G X 5 MM MISC    USE TO INJECT INSULIN 5 TIMES A DAY   ESCITALOPRAM (LEXAPRO) 20 MG TABLET    TAKE 1 TABLET (20 MG TOTAL) BY MOUTH DAILY.   FEXOFENADINE (ALLEGRA) 180 MG TABLET    Take 1 tablet (180 mg total) by mouth daily.   FLUTICASONE-SALMETEROL (ADVAIR DISKUS) 250-50 MCG/DOSE AEPB    INHALE 1 PUFF BY MOUTH TWICE DAILY   FUROSEMIDE (LASIX) 40 MG TABLET    TAKE 1/2 A TABLET (20  MG TOTAL) BY MOUTH DAILY.   GLUCOSE BLOOD (FREESTYLE LITE) TEST STRIP    Test 3 to 4 times daily   INSULIN GLARGINE (LANTUS SOLOSTAR) 100 UNIT/ML SOLOSTAR PEN    INJECT 64 UNITS SUBCUTANEOUSLY EVERY NIGHT AT BEDTIME AND 30 UNITS 12 HOURS LATER.   LEVOTHYROXINE (SYNTHROID, LEVOTHROID) 112 MCG TABLET    TAKE 1 TABLET EVERY MORNING ON AN EMPTY STOMACH ONCE A DAY   MONTELUKAST (SINGULAIR) 10 MG TABLET    TAKE 1 TABLET BY MOUTH EVERY EVENING.   NITROGLYCERIN (NITROSTAT) 0.4 MG SL TABLET    Place 1 tab under the tongue every 5 minutes as needed for chest pain. Please schedule appt for further refills. 3rd/final attempt.   NOVOLOG FLEXPEN 100 UNIT/ML FLEXPEN    INJECT 20 UNITS SUBCUTANEOUSLY THREE TIMES A DAY BEFORE MEALS; ADJUST AND INCREASE ACCORDINGLY.   ONE TOUCH ULTRA TEST TEST STRIP  TEST BLOOD SUGAR 2-3 TIMES A DAY   ONETOUCH DELICA LANCETS 33G MISC    USE TO CHECK BLOOD SUGAR THREE TIMES A DAY   PANTOPRAZOLE (PROTONIX) 20 MG TABLET    TAKE ONE TABLET BY MOUTH TWICE DAILY BEFORE A MEAL   POLYETHYL GLYCOL-PROPYL GLYCOL (SYSTANE) 0.4-0.3 % SOLN    Apply to eye 4 (four) times daily.   POTASSIUM CHLORIDE (KLOR-CON) 10 MEQ CR TABLET    Take 10 mEq by mouth 2 (two) times daily.    PROAIR HFA 108 (90 BASE) MCG/ACT INHALER    INHALE 2 PUFFS BY MOUTH EVERY 6 HOURS AS NEEDED. PT MUST HAVE OFFICE VISIT FOR MORE REFILLS   SIMVASTATIN (ZOCOR) 10 MG TABLET    TAKE 1 TABLET BY MOUTH EVERY EVENING   TIOTROPIUM (SPIRIVA HANDIHALER) 18 MCG INHALATION CAPSULE    Place 1 capsule (18 mcg total) into inhaler and inhale daily. INHALE THE CONTENTS OF 1 CAPSULE VIA HANDIHALER BY MOUTH EVERY DAY   VITAMIN D, ERGOCALCIFEROL, (DRISDOL) 50000 UNITS CAPS CAPSULE    TAKE 1 CAPSULE (50,000 UNITS TOTAL) BY MOUTH EVERY 7 (SEVEN) DAYS.   WHITE PETROLATUM-MINERAL OIL (SYSTANE NIGHTTIME) OINT    Place 1 application into both eyes at bedtime as needed (irritation).     Review of Systems: General:   Denies fever, chills, unexplained  weight loss.  Optho/Auditory:   Denies visual changes, blurred vision/LOV Respiratory:   Denies SOB, DOE more than baseline levels.   Cardiovascular:   Denies chest pain, palpitations, new onset peripheral edema  Gastrointestinal:   Denies nausea, vomiting, diarrhea.  Genitourinary: Denies dysuria, freq/ urgency, flank pain or discharge from genitals.  Endocrine:     Denies hot or cold intolerance, polyuria, polydipsia. Musculoskeletal:   + myalgias, denies joint swelling, + arthralgias, denies gait problems.  Skin:  Denies rash, suspicious lesions Neurological:     Denies dizziness, unexplained weakness, numbness  Psychiatric/Behavioral:   Denies mood changes, suicidal or homicidal ideations, hallucinations    Objective:  Blood pressure 101/66, pulse (!) 55, temperature (!) 97.5 F (36.4 C), height 5' (1.524 m), weight 179 lb 14.4 oz (81.6 kg), SpO2 97 %. Body mass index is 35.13 kg/m.   Significant muscle spasms in the right paravertebral region from C5 down to T4, also from T12, down to L4-5 on the right as well.  Gen: A & O *3,  No acute distress HEENT:  /AT Back Exam:  Inspection/palpation: Significant muscle spasms in the right paravertebral region from C5 down to T4, also from T12, down to L4-5 on the right as well SLR laying: equivocal b/l Palpable tenderness:  r lower back and R upper back/ neck FABER: unable to perform Sensory change: Gross sensation intact to all lumbar and sacral dermatomes.  Reflexes: 2+ at both patellar tendons, 2+ at achilles tendons, Babinski's downgoing.  Strength at foot  Plantar-flexion: 5/5 Dorsi-flexion: 5/5 Leg strength  Quad: 5/5 Hamstring: 5/5  Gait unremarkable.

## 2018-07-14 NOTE — Patient Instructions (Addendum)
Please make sure Ms. Kailan you are following up with Dr. Romero Belling regarding your diabetes if you are due to do so.  Otherwise I will see you mid I will see you the 12th of December for your planned follow-up to go over your diabetes.   If you recall, you are following up with me every 6 months to for your diabetes and Dr. Romero Belling in between so that you would be followed by somebody every 3 months.   Please bring in all your A1c is from her office as well.  -Next office visit please bring in your fasting and 2-hour postprandial blood sugars for our review.  Your A1c was 8.4 today which is up from prior- this is not good, so please keep an eye on your blood sugars at home.     Muscle Cramps and Spasms Muscle cramps and spasms occur when a muscle or muscles tighten and you have no control over this tightening (involuntary muscle contraction). They are a common problem and can develop in any muscle. The most common place is in the calf muscles of the leg. Muscle cramps and muscle spasms are both involuntary muscle contractions, but there are some differences between the two:  Muscle cramps are painful. They come and go and may last a few seconds to 15 minutes. Muscle cramps are often more forceful and last longer than muscle spasms.  Muscle spasms may or may not be painful. They may also last just a few seconds or much longer.  Certain medical conditions, such as diabetes or Parkinson disease, can make it more likely to develop cramps or spasms. However, cramps or spasms are usually not caused by a serious underlying problem. Common causes include:  Overexertion.  Overuse from repetitive motions, or doing the same thing over and over.  Remaining in a certain position for a long period of time.  Improper preparation, form, or technique while playing a sport or doing an activity.  Dehydration.  Injury.  Side effects of some medicines.  Abnormally low levels of the salts and ions in your blood  (electrolytes), especially potassium and calcium. This could happen if you are taking water pills (diuretics) or if you are pregnant.  In many cases, the cause of muscle cramps or spasms is unknown. Follow these instructions at home:  Stay well hydrated. Drink enough fluid to keep your urine clear or pale yellow.  Try massaging, stretching, and relaxing the affected muscle.  If directed, apply heat to tight or tense muscles as often as told by your health care provider. Use the heat source that your health care provider recommends, such as a moist heat pack or a heating pad. ? Place a towel between your skin and the heat source. ? Leave the heat on for 20-30 minutes. ? Remove the heat if your skin turns bright red. This is especially important if you are unable to feel pain, heat, or cold. You may have a greater risk of getting burned.  If directed, put ice on the affected area. This may help if you are sore or have pain after a cramp or spasm. ? Put ice in a plastic bag. ? Place a towel between your skin and the bag. ? Leavethe ice on for 20 minutes, 2-3 times a day.  Take over-the-counter and prescription medicines only as told by your health care provider.  Pay attention to any changes in your symptoms. Contact a health care provider if:  Your cramps or spasms get more severe  or happen more often.  Your cramps or spasms do not improve over time. This information is not intended to replace advice given to you by your health care provider. Make sure you discuss any questions you have with your health care provider. Document Released: 01/26/2002 Document Revised: 09/07/2015 Document Reviewed: 05/10/2015 Elsevier Interactive Patient Education  2018 [employer].       Cyclobenzaprine tablets What is this medicine? CYCLOBENZAPRINE (sye kloe BEN za preen) is a muscle relaxer. It is used to treat muscle pain, spasms, and stiffness. This medicine may be used for other purposes;  ask your health care provider or pharmacist if you have questions. COMMON BRAND NAME(S): Fexmid, Flexeril What should I tell my health care provider before I take this medicine? They need to know if you have any of these conditions: -heart disease, irregular heartbeat, or previous heart attack -liver disease -thyroid problem -an unusual or allergic reaction to cyclobenzaprine, tricyclic antidepressants, lactose, other medicines, foods, dyes, or preservatives -pregnant or trying to get pregnant -breast-feeding How should I use this medicine? Take this medicine by mouth with a glass of water. Follow the directions on the prescription label. If this medicine upsets your stomach, take it with food or milk. Take your medicine at regular intervals. Do not take it more often than directed. Talk to your pediatrician regarding the use of this medicine in children. Special care may be needed. Overdosage: If you think you have taken too much of this medicine contact a poison control center or emergency room at once. NOTE: This medicine is only for you. Do not share this medicine with others. What if I miss a dose? If you miss a dose, take it as soon as you can. If it is almost time for your next dose, take only that dose. Do not take double or extra doses. What may interact with this medicine? Do not take this medicine with any of the following medications: -certain medicines for fungal infections like fluconazole, itraconazole, ketoconazole, posaconazole, voriconazole -cisapride -dofetilide -dronedarone -halofantrine -levomethadyl -MAOIs like Carbex, Eldepryl, Marplan, Nardil, and Parnate -narcotic medicines for cough -pimozide -thioridazine -ziprasidone This medicine may also interact with the following medications: -alcohol -antihistamines for allergy, cough and cold -certain medicines for anxiety or sleep -certain medicines for cancer -certain medicines for depression like amitriptyline,  fluoxetine, sertraline -certain medicines for infection like alfuzosin, chloroquine, clarithromycin, levofloxacin, mefloquine, pentamidine, troleandomycin -certain medicines for irregular heart beat -certain medicines for seizures like phenobarbital, primidone -contrast dyes -general anesthetics like halothane, isoflurane, methoxyflurane, propofol -local anesthetics like lidocaine, pramoxine, tetracaine -medicines that relax muscles for surgery -narcotic medicines for pain -other medicines that prolong the QT interval (cause an abnormal heart rhythm) -phenothiazines like chlorpromazine, mesoridazine, prochlorperazine This list may not describe all possible interactions. Give your health care provider a list of all the medicines, herbs, non-prescription drugs, or dietary supplements you use. Also tell them if you smoke, drink alcohol, or use illegal drugs. Some items may interact with your medicine. What should I watch for while using this medicine? Tell your doctor or health care professional if your symptoms do not start to get better or if they get worse. You may get drowsy or dizzy. Do not drive, use machinery, or do anything that needs mental alertness until you know how this medicine affects you. Do not stand or sit up quickly, especially if you are an older patient. This reduces the risk of dizzy or fainting spells. Alcohol may interfere with the effect of this medicine.  Avoid alcoholic drinks. If you are taking another medicine that also causes drowsiness, you may have more side effects. Give your health care provider a list of all medicines you use. Your doctor will tell you how much medicine to take. Do not take more medicine than directed. Call emergency for help if you have problems breathing or unusual sleepiness. Your mouth may get dry. Chewing sugarless gum or sucking hard candy, and drinking plenty of water may help. Contact your doctor if the problem does not go away or is  severe. What side effects may I notice from receiving this medicine? Side effects that you should report to your doctor or health care professional as soon as possible: -allergic reactions like skin rash, itching or hives, swelling of the face, lips, or tongue -breathing problems -chest pain -fast, irregular heartbeat -hallucinations -seizures -unusually weak or tired Side effects that usually do not require medical attention (report to your doctor or health care professional if they continue or are bothersome): -headache -nausea, vomiting This list may not describe all possible side effects. Call your doctor for medical advice about side effects. You may report side effects to FDA at 1-800-FDA-1088. Where should I keep my medicine? Keep out of the reach of children. Store at room temperature between 15 and 30 degrees C (59 and 86 degrees F). Keep container tightly closed. Throw away any unused medicine after the expiration date. NOTE: This sheet is a summary. It may not cover all possible information. If you have questions about this medicine, talk to your doctor, pharmacist, or health care provider.  2018 Elsevier/Gold Standard (2015-05-17 12:05:46)

## 2018-07-24 ENCOUNTER — Other Ambulatory Visit: Payer: Self-pay | Admitting: Family Medicine

## 2018-07-31 ENCOUNTER — Ambulatory Visit: Payer: PPO | Admitting: Family Medicine

## 2018-08-04 DIAGNOSIS — H3589 Other specified retinal disorders: Secondary | ICD-10-CM | POA: Diagnosis not present

## 2018-08-04 DIAGNOSIS — H18413 Arcus senilis, bilateral: Secondary | ICD-10-CM | POA: Diagnosis not present

## 2018-08-04 DIAGNOSIS — H0102A Squamous blepharitis right eye, upper and lower eyelids: Secondary | ICD-10-CM | POA: Diagnosis not present

## 2018-08-04 DIAGNOSIS — Z961 Presence of intraocular lens: Secondary | ICD-10-CM | POA: Diagnosis not present

## 2018-08-04 DIAGNOSIS — H0102B Squamous blepharitis left eye, upper and lower eyelids: Secondary | ICD-10-CM | POA: Diagnosis not present

## 2018-08-04 DIAGNOSIS — H4321 Crystalline deposits in vitreous body, right eye: Secondary | ICD-10-CM | POA: Diagnosis not present

## 2018-08-04 DIAGNOSIS — H04123 Dry eye syndrome of bilateral lacrimal glands: Secondary | ICD-10-CM | POA: Diagnosis not present

## 2018-08-04 DIAGNOSIS — H40013 Open angle with borderline findings, low risk, bilateral: Secondary | ICD-10-CM | POA: Diagnosis not present

## 2018-08-04 DIAGNOSIS — E109 Type 1 diabetes mellitus without complications: Secondary | ICD-10-CM | POA: Diagnosis not present

## 2018-08-04 DIAGNOSIS — Z9849 Cataract extraction status, unspecified eye: Secondary | ICD-10-CM | POA: Diagnosis not present

## 2018-08-04 DIAGNOSIS — S0501XA Injury of conjunctiva and corneal abrasion without foreign body, right eye, initial encounter: Secondary | ICD-10-CM | POA: Diagnosis not present

## 2018-08-14 ENCOUNTER — Other Ambulatory Visit: Payer: Self-pay | Admitting: Family Medicine

## 2018-08-14 DIAGNOSIS — M545 Low back pain, unspecified: Secondary | ICD-10-CM

## 2018-08-14 DIAGNOSIS — M62838 Other muscle spasm: Secondary | ICD-10-CM

## 2018-08-14 NOTE — Telephone Encounter (Signed)
Would need OV for reassessmnt as taking this on a more regular basis is not recommended for someone her age

## 2018-08-14 NOTE — Telephone Encounter (Signed)
Patient was seen 07/14/2018 and given medication for acute right sided LBP.  Please review and advise if refill is appropriate for the patient. MPulliam, CMA/RT(R)

## 2018-08-15 ENCOUNTER — Telehealth: Payer: Self-pay | Admitting: Family Medicine

## 2018-08-15 NOTE — Telephone Encounter (Signed)
Patient called back based on previous call from [place] on Flexeril refill request. Please contact patient when available

## 2018-08-15 NOTE — Telephone Encounter (Signed)
Called patient left message for patient to call the office. MPulliam, CMA/RT(R)

## 2018-08-21 ENCOUNTER — Other Ambulatory Visit: Payer: Self-pay | Admitting: Family Medicine

## 2018-08-21 NOTE — Telephone Encounter (Signed)
Appointment made for 08/26/2018. MPulliam, CMA/RT(R)

## 2018-08-21 NOTE — Telephone Encounter (Signed)
Notified patient that she will needs OV for refills per Dr. Sharee Holster,  appt made for 08/26/2018. MPulliam, CMA/RT(R)

## 2018-08-22 ENCOUNTER — Other Ambulatory Visit: Payer: Self-pay

## 2018-08-22 MED ORDER — INSULIN ASPART 100 UNIT/ML FLEXPEN: PEN_INJECTOR | SUBCUTANEOUS | 2 refills | Status: DC

## 2018-08-26 ENCOUNTER — Ambulatory Visit (INDEPENDENT_AMBULATORY_CARE_PROVIDER_SITE_OTHER): Payer: PPO | Admitting: Family Medicine

## 2018-08-26 ENCOUNTER — Encounter: Payer: Self-pay | Admitting: Family Medicine

## 2018-08-26 VITALS — BP 126/85 | HR 66 | Temp 97.7°F | Ht 60.0 in | Wt 181.0 lb

## 2018-08-26 DIAGNOSIS — E039 Hypothyroidism, unspecified: Secondary | ICD-10-CM

## 2018-08-26 DIAGNOSIS — E1069 Type 1 diabetes mellitus with other specified complication: Secondary | ICD-10-CM

## 2018-08-26 DIAGNOSIS — E1159 Type 2 diabetes mellitus with other circulatory complications: Secondary | ICD-10-CM | POA: Diagnosis not present

## 2018-08-26 DIAGNOSIS — IMO0001 Reserved for inherently not codable concepts without codable children: Secondary | ICD-10-CM

## 2018-08-26 DIAGNOSIS — I10 Essential (primary) hypertension: Secondary | ICD-10-CM | POA: Diagnosis not present

## 2018-08-26 DIAGNOSIS — E118 Type 2 diabetes mellitus with unspecified complications: Secondary | ICD-10-CM

## 2018-08-26 DIAGNOSIS — R809 Proteinuria, unspecified: Secondary | ICD-10-CM | POA: Diagnosis not present

## 2018-08-26 DIAGNOSIS — F329 Major depressive disorder, single episode, unspecified: Secondary | ICD-10-CM | POA: Diagnosis not present

## 2018-08-26 DIAGNOSIS — E1129 Type 2 diabetes mellitus with other diabetic kidney complication: Secondary | ICD-10-CM | POA: Diagnosis not present

## 2018-08-26 DIAGNOSIS — Z794 Long term (current) use of insulin: Secondary | ICD-10-CM

## 2018-08-26 DIAGNOSIS — B379 Candidiasis, unspecified: Secondary | ICD-10-CM

## 2018-08-26 DIAGNOSIS — E782 Mixed hyperlipidemia: Secondary | ICD-10-CM | POA: Diagnosis not present

## 2018-08-26 DIAGNOSIS — I152 Hypertension secondary to endocrine disorders: Secondary | ICD-10-CM

## 2018-08-26 DIAGNOSIS — F32A Depression, unspecified: Secondary | ICD-10-CM

## 2018-08-26 LAB — POCT UA - MICROALBUMIN
Creatinine, POC: 50 mg/dL
Microalbumin Ur, POC: 20 mg/L

## 2018-08-26 MED ORDER — LOSARTAN POTASSIUM 25 MG PO TABS: 25.0000 mg | ORAL_TABLET | Freq: Every day | ORAL | 0 refills | Status: DC

## 2018-08-26 MED ORDER — FLUCONAZOLE 150 MG PO TABS: ORAL_TABLET | ORAL | 0 refills | Status: DC

## 2018-08-26 NOTE — Progress Notes (Signed)
Impression and Recommendations:    1. Insulin dependent diabetes mellitus with complications ([name])   2. Microalbuminuria due to type 2 diabetes mellitus ([name])   3. Mixed diabetic hyperlipidemia associated with type 1 diabetes mellitus ([name])   4. Hypertension associated with diabetes ([name])   5. Morbidly obese ([name])- BMI >er 35 w DM   6. Depression, unspecified depression type   7. Hypothyroidism, unspecified type   8. Candidiasis    - Re-check CMP in 3 months at next office visit.  1. Insulin dependent diabetes mellitus with complications ([name]) - Blood sugar above goal last check. - Last A1c was 8.4.  Next due in February. -  Discussed goal A1c of less than 8.0.  - Continue treatment plan as prescribed.  See med list below. - Patient tolerating meds well without complication.  Denies S-E  - Counseled patient on pathophysiology of disease and discussed various treatment options, which often includes dietary and lifestyle modifications as first line.  Importance of low carb/ketogenic diet discussed with patient in addition to regular exercise.   - Check FBS and 2 hours after the biggest meal of your day.  Keep log and bring in next OV for my review.   Also, if you ever feel poorly, please check your blood pressure and blood sugar, as one or the other could be the cause of your symptoms.  - Being a diabetic, you need yearly eye and foot exams. Make appt.for diabetic eye exam.  - Continue to follow up with [name] as established.  2. Urine - Microalbuminuria - Check today is 30-300, in the abnormal range.  Last checks, urine was normal. - Educated patient extensively about preservation of kidney health. - Discussed that organ health can be preserved by controlling blood sugar and blood pressure.  - Will begin new medication today, losartan.  See med list today.  3. Hypertension associated with diabetes ([name]) - Losartan prescribed today. - Blood pressure remains stable at  this time; managed on several medications through cardiology. - Continue treatment plan as prescribed.  See med list below. - Patient tolerating meds well without complication.  Denies S-E  - Ambulatory BP monitoring encouraged. Keep log and bring in next OV. - Patient knows to keep an eye out for red flags and sx of low blood pressure. - Will continue to monitor.  4. Congestive Heart Failure - Continue to follow up with Dr. Acie Fredrickson as established. - Patient knows to continue to obtain management through cardiology.  - Continue treatment plan as prescribed.  See med list below. - Patient tolerating meds well without complication.  Denies S-E - Will continue to monitor.  5. Mixed diabetic hyperlipidemia associated with type 1 diabetes mellitus ([name]) - Stable at this time, managed on Zocor. - Continue treatment plan as prescribed.  See med list below. - Patient tolerating meds well without complication.  Denies S-E. - Will continue to monitor.  6. Depression, unspecified depression type - Stable at this time, managed on Lexapro. - Continue treatment plan as prescribed.  See med list below. - Patient tolerating meds well without complication.  Denies S-E. - Will continue to monitor.  7. Acute Yeast Infection - Patient was recently placed on clindamycin, back in end of November here at the clinic. - Per patient, has felt irritated for about a week now. - Diflucan prescribed today.  8. Hypothyroidism, Unspecified - Need for TSH re-check. - Will continue to monitor.  9. BMI Counseling - BMI >  er 55 w DM Explained to patient what BMI refers to, and what it means medically.    Told patient to think about it as a "medical risk stratification measurement" and how increasing BMI is associated with increasing risk/ or worsening state of various diseases such as hypertension, hyperlipidemia, diabetes, premature OA, depression etc.  American Heart Association guidelines for healthy diet,  basically Mediterranean diet, and exercise guidelines of 30 minutes 5 days per week or more discussed in detail.  Health counseling performed.  All questions answered.  10. Lifestyle & Preventative Health Maintenance - Advised patient to continue working toward exercising to improve overall mental, physical, and emotional health.    - Reviewed the "spokes of the wheel" of mood and health management.  Stressed the importance of ongoing prudent habits, including regular exercise, appropriate sleep hygiene, healthful dietary habits, and prayer/meditation to relax.  - Encouraged patient to engage in daily physical activity, especially a formal exercise routine.  Recommended that the patient eventually strive for at least 150 minutes of moderate cardiovascular activity per week according to guidelines established by the [facility].   - Healthy dietary habits encouraged, including low-carb, and high amounts of lean protein in diet.   - Patient should also consume adequate amounts of water.  Pt was interviewed and evaluated by me in the clinic today for 32.5+ minutes, with over 50% time spent in face to face counseling of patients various medical conditions, treatment plans of those medical conditions including medicine management and lifestyle modification, strategies to improve health and well being; and in coordination of care. SEE ABOVE TREATMENT PLAN FOR DETAILS    Orders Placed This Encounter  Procedures  . TSH  . T4, free  . Hemoglobin A1c    Meds ordered this encounter  Medications  . losartan (COZAAR) 25 MG tablet    Sig: Take 1 tablet (25 mg total) by mouth daily.    Dispense:  90 tablet    Refill:  0    Needs bldwrk prior to RF  . fluconazole (DIFLUCAN) 150 MG tablet    Sig: 1 p.o. x1 then repeat 1 week    Dispense:  2 tablet    Refill:  0    Medications Discontinued During This Encounter  Medication Reason  . potassium chloride (KLOR-CON) 10 MEQ CR tablet Duplicate  .  fexofenadine (ALLEGRA) 180 MG tablet Patient Preference  . glucose blood (FREESTYLE LITE) test strip      Gross side effects, risk and benefits, and alternatives of medications and treatment plan in general discussed with patient.  Patient is aware that all medications have potential side effects and we are unable to predict every side effect or drug-drug interaction that may occur.   Patient will call with any questions prior to using medication if they have concerns.    Expresses verbal understanding and consents to current therapy and treatment regimen.  No barriers to understanding were identified.  Red flag symptoms and signs discussed in detail.  Patient expressed understanding regarding what to do in case of emergency\urgent symptoms  Please see AVS handed out to patient at the end of our visit for further patient instructions/ counseling done pertaining to today's office visit.   No follow-ups on file.     Note:  This note was prepared with assistance of Dragon voice recognition software. Occasional wrong-word or sound-a-like substitutions may have occurred due to the inherent limitations of voice recognition software.   This document serves as a record of services  personally performed by Mellody Dance, DO. It was created on her behalf by [name], a trained medical scribe. The creation of this record is based on the scribe's personal observations and the provider's statements to them.   I have reviewed the above medical documentation for accuracy and completeness and I concur.  Mellody Dance, DO 08/27/2018 9:05 PM       ---------------------------------------------------------------------------------------------------------------------------------    Subjective:     HPI: [name] is a 85 y.o. female who presents to [place] [facility] today for issues as discussed below.  Patient does see [name] every six months.  Congestive  Heart Failure Still sees Dr. Acie Fredrickson once or twice per year, of Cardiology.  Continues on lasix as prescribed.  Acute Yeast Infection Patient recently had a yeast infection, after taking clindamycin end of November.  Was placed on clindamycin back in end of November here at the clinic.  Has felt irritated for about a week or so.  Mood & Depression Her mood is doing well.  Feels that Lexapro continues helping with her sx.  Notes she continues experiencing trouble (pain) with her neck and back.  Diabetes Mellitus  -  She has been working on diet and exercise for diabetes.  Has been working on her diet since her last A1c.  Confirms that her last A1c of 8.4 scared her a little bit and inspired her to take better care of herself.  Is down to 181 lbs since July.  Pt is currently maintained on the following medications for diabetes:   see med list today  Medication compliance - continues on treatment plan as prescribed.  Home glucose readings range: Feels they've been good in general, running 84 to 120.  Her fasting reading this morning was bad, but she ate some ice cream last night, which she states she knows caused her reading to be high this morning.   Denies polyuria/polydipsia.  Denies hypo/ hyperglycemia symptoms  Last diabetic eye exam was  Lab Results  Component Value Date   HMDIABEYEEXA Retinopathy (A) 04/30/2016    Foot exam- UTD  Last A1C in the office was:  Lab Results  Component Value Date   HGBA1C 8.4 (A) 07/14/2018   HGBA1C 8.0 (A) 03/12/2018   HGBA1C 8.3 (A) 06/25/2017    Lab Results  Component Value Date   MICROALBUR 10 05/22/2017   LDLCALC 75 03/12/2018   CREATININE 0.88 03/12/2018    Wt Readings from Last 3 Encounters:  08/26/18 181 lb (82.1 kg)  07/14/18 179 lb 14.4 oz (81.6 kg)  03/12/18 187 lb 8 oz (85 kg)    Hypertension Associated with Diabetes Mellitus  -  Her blood pressure has been controlled at home.  Pt may or may not be checking it at  home.   - Patient reports good compliance with blood pressure medications  - Denies medication S-E   - Smoking Status noted   - She denies new onset of: chest pain, exercise intolerance, shortness of breath, dizziness, visual changes, headache, lower extremity swelling or claudication.   Last 3 blood pressure readings in our office are as follows: BP Readings from Last 3 Encounters:  08/26/18 126/85  07/14/18 101/66  03/12/18 123/72    Filed Weights   08/26/18 0922  Weight: 181 lb (82.1 kg)    Hyperlipidemia Associated with Diabetes Mellitus  - Patient reports good compliance with medications or treatment plan.  - Denies medication S-E   - Smoking Status noted   -  She denies new onset of: chest pain, exercise intolerance, shortness of breath, dizziness, visual changes, headache, lower extremity swelling or claudication.   Denies myalgias.  The cholesterol last visit was:  Lab Results  Component Value Date   CHOL 154 03/12/2018   HDL 50 03/12/2018   LDLCALC 75 03/12/2018   TRIG 145 03/12/2018   CHOLHDL 3.1 03/12/2018    Hepatic Function Latest Ref Rng & Units 03/12/2018 06/25/2017 05/27/2017  Total Protein 6.0 - 8.5 g/dL 6.8 - 6.6  Albumin 3.5 - 4.7 g/dL 3.3(L) - 3.4(L)  AST 0 - 40 IU/L _0 ALT 0 - 32 IU/L _1 Alk Phosphatase 39 - 117 IU/L 174(H) 178(A) 191(H)  Total Bilirubin 0.0 - 1.2 mg/dL 0.7 - 0.5  Bilirubin, Direct 0.0 - 0.3 mg/dL - - -     Pulse Readings from Last 3 Encounters:  08/26/18 66  07/14/18 (!) 55  03/12/18 66   BMI Readings from Last 3 Encounters:  08/26/18 35.35 kg/m  07/14/18 35.13 kg/m  03/12/18 36.62 kg/m     Patient Care Team    Relationship Specialty Notifications Start End  Mellody Dance, DO PCP - General Family Medicine  03/12/16   Brand Males, MD Attending Physician Pulmonary Disease Abnormal results only, Admissions 12/07/12   Nahser, Wonda Cheng, MD Consulting Physician Cardiology  05/01/16   Jackquline Denmark, MD Referring Physician Gastroenterology  05/01/16   Jacelyn Pi, MD Consulting Physician Endocrinology  05/22/17      Patient Active Problem List   Diagnosis Date Noted  . Microalbuminuria due to type 2 diabetes mellitus ([place]) 08/26/2018  . Hypocalcemia 03/12/2018  . Gastroesophageal reflux disease 12/31/2017  . chronic Low platelet count 06/05/2017  . Hypertension associated with diabetes ([place]) 05/22/2017  . Morbidly obese ([place])- BMI >er 35 w DM 06/23/2016  . chronic Leukopenia 05/11/2016  . Vitamin D deficiency 05/11/2016  . Elevated serum alkaline phosphatase level- hepatocellular in origin 05/11/2016  . Depression 03/13/2016  . Hypothyroidism 03/13/2016  . Insulin dependent diabetes mellitus with complications ([place]) 41/32/4401  . Mixed diabetic hyperlipidemia associated with type 1 diabetes mellitus ([place]) 03/13/2016  . Constipation, chronic 03/13/2016  . At high risk for osteoporosis 03/13/2016  . Hypotension 12/13/2012  . Hypokalemia 12/13/2012  . Thrombocytopenia, unspecified ([place]) 12/13/2012  . h/o Clostridium difficile colitis 12/12/2012  . History of GI bleed 12/12/2012  . ILD (interstitial lung disease) ([place]) 11/25/2012  . Coronary artery disease 04/11/2011  . COPD (chronic obstructive pulmonary disease) ([place]) 04/11/2011  . Obstructive sleep apnea 04/11/2011  . Heart failure, diastolic, chronic ([place]) 02/72/5366  . Atherosclerosis of carotid arteries 04/03/2006    Past Medical history, Surgical history, Family history, Social history, Allergies and Medications have been entered into the medical record, reviewed and changed as needed.    Current Meds  Medication Sig  . Artificial Tear Solution (GENTEAL TEARS OP) Apply to eye at bedtime.  . Blood Glucose Monitoring Suppl (FREESTYLE LITE) DEVI Use to check blood sugars 2-3 times daily  . Calcium Carbonate-Vitamin D 600-400 MG-UNIT tablet Please get a calcium supplement tablet over-the-counter to equal 1200 mg  of calcium per day  . Carboxymeth-Glyc-Polysorb PF (REFRESH OPTIVE MEGA-3) 0.5-1-0.5 % SOLN Apply 2 drops to eye 2 (two) times daily.  . carvedilol (COREG) 3.125 MG tablet TAKE ONE TABLET BY MOUTH TWO TIMES DAILY WITH A MEAL  . cyclobenzaprine (FLEXERIL) 10 MG tablet Take 1 tablet (10 mg total) by mouth 3 (three) times daily as  needed for muscle spasms. (start at nite 1st)  . cycloSPORINE (RESTASIS) 0.05 % ophthalmic emulsion Place 1 drop into both eyes 2 (two) times daily.  Marland Kitchen EASY TOUCH PEN NEEDLES 31G X 5 MM MISC USE TO INJECT INSULIN 5 TIMES A DAY  . escitalopram (LEXAPRO) 20 MG tablet TAKE 1 TABLET (20 MG TOTAL) BY MOUTH DAILY.  Marland Kitchen Fluticasone-Salmeterol (ADVAIR DISKUS) 250-50 MCG/DOSE AEPB INHALE 1 PUFF BY MOUTH TWICE DAILY  . furosemide (LASIX) 40 MG tablet TAKE 1/2 A TABLET (20 MG TOTAL) BY MOUTH DAILY.  Marland Kitchen insulin aspart (NOVOLOG FLEXPEN) 100 UNIT/ML FlexPen INJECT 20 UNITS SUBCUTANEOUSLY THREE TIMES A DAY BEFORE MEALS; ADJUST AND INCREASE ACCORDINGLY.  . Insulin Glargine (LANTUS SOLOSTAR) 100 UNIT/ML Solostar Pen INJECT 64 UNITS SUBCUTANEOUSLY EVERY NIGHT AT BEDTIME AND 30 UNITS 12 HOURS LATER.  Marland Kitchen levothyroxine (SYNTHROID, LEVOTHROID) 112 MCG tablet TAKE 1 TABLET EVERY MORNING ON AN EMPTY STOMACH ONCE A DAY  . montelukast (SINGULAIR) 10 MG tablet TAKE 1 TABLET BY MOUTH EVERY EVENING.  . nitroGLYCERIN (NITROSTAT) 0.4 MG SL tablet Place 1 tab under the tongue every 5 minutes as needed for chest pain. Please schedule appt for further refills. 3rd/final attempt.  . ONE TOUCH ULTRA TEST test strip TEST BLOOD SUGAR 2-3 TIMES A DAY  . ONETOUCH DELICA LANCETS 17G MISC USE TO CHECK BLOOD SUGAR THREE TIMES A DAY  . pantoprazole (PROTONIX) 20 MG tablet TAKE ONE TABLET BY MOUTH TWICE DAILY BEFORE A MEAL  . Polyethyl Glycol-Propyl Glycol (SYSTANE) 0.4-0.3 % SOLN Apply to eye 4 (four) times daily.  . potassium chloride (K-DUR,KLOR-CON) 10 MEQ tablet Take 1 tablet (10 mEq total) by mouth 2 (two) times  daily. (Patient taking differently: Take 20 mEq by mouth 2 (two) times daily. )  . PROAIR HFA 108 (90 Base) MCG/ACT inhaler INHALE 2 PUFFS BY MOUTH EVERY 6 HOURS AS NEEDED. PT MUST HAVE OFFICE VISIT FOR MORE REFILLS  . simvastatin (ZOCOR) 10 MG tablet TAKE 1 TABLET BY MOUTH ONCE DAILY IN THE EVENING  . tiotropium (SPIRIVA HANDIHALER) 18 MCG inhalation capsule Place 1 capsule (18 mcg total) into inhaler and inhale daily. INHALE THE CONTENTS OF 1 CAPSULE VIA HANDIHALER BY MOUTH EVERY DAY  . Vitamin D, Ergocalciferol, (DRISDOL) 50000 units CAPS capsule TAKE 1 CAPSULE (50,000 UNITS TOTAL) BY MOUTH EVERY 7 (SEVEN) DAYS.  Dema Severin Petrolatum-Mineral Oil (SYSTANE NIGHTTIME) OINT Place 1 application into both eyes at bedtime as needed (irritation).     Allergies:  Allergies  Allergen Reactions  . Ace Inhibitors Cough  . Benadryl [Diphenhydramine Hcl (Sleep)] Other (See Comments)    hypotension  . Penicillins Hives and Swelling    Swelling of arms Has patient had a PCN reaction causing immediate rash, facial/tongue/throat swelling, SOB or lightheadedness with hypotension: unknown Has patient had a PCN reaction causing severe rash involving mucus membranes or skin necrosis: no Has patient had a PCN reaction that required hospitalization: unknown Has patient had a PCN reaction occurring within the last 10 years: no, childhood allergy If all of the above answers are "NO", then may proceed with Cephalosporin use.   . Sulfonamide Derivatives Swelling     Review of Systems:  A fourteen system review of systems was performed and found to be positive as per HPI.   Objective:   Blood pressure 126/85, pulse 66, temperature 97.7 F (36.5 C), height 5' (1.524 m), weight 181 lb (82.1 kg), SpO2 95 %. Body mass index is 35.35 kg/m. General:  Well Developed, well nourished,  appropriate for stated age.  Neuro:  Alert and oriented,  extra-ocular muscles intact  HEENT:  Normocephalic, atraumatic, neck  supple, no carotid bruits appreciated  Skin:  no gross rash, warm, pink. Cardiac:  RRR, S1 S2 Respiratory:  ECTA B/L and A/P, Not using accessory muscles, speaking in full sentences- unlabored. Vascular:  Ext warm, no cyanosis apprec.; cap RF less 2 sec. Psych:  No HI/SI, judgement and insight good, Euthymic mood. Full Affect.

## 2018-08-26 NOTE — Patient Instructions (Addendum)
Ms. Lauretta GrillVeta, please start off by taking 1/2 tablet of the losartan daily.  Make sure you tolerate it well and blood pressure is stable, if so then you can go up to 1 full tablet after 6 to 8 days of tolerating it well.  Again, please make sure you are monitoring your blood pressure if you feel a little funny. -Once on the losartan, I would like to see you in 3 months and we can recheck a CMP at that time.  -Diflucan was sent into your pharmacy so please let me know if there are any problems.   Try to work on walking slowly every day and continue to keep those blood sugars under good control.  This is the best way to prevent deterioration of your kidneys etc.     PLEASE NOTE:  Thank you for your time and patience today.  I hope you found your office visit with me encouraging and educational, as my goal is to help my patients become healthier and happier.  We will call or contact you via MyChart in the next couple of days, if labs are critically abnormal and need to be addressed immediately.  Otherwise you will usually hear from us within 1 week or so via phone call or MyChart.  Lastly, please note, if you have an active MyChart account, even if you are not using it and checking it regularly, this is how we will be contacting you regarding lab results, test results etc. So please remember to check your account 1 to 2 weeks after blood work or tests that we ordered is completed.    If you do not want to be emailed results and wish to be called instead, you will need to cancel the MyChart account.   Tyler at our front desk can do that for you.  If you need medication refills, please be sure to call your pharmacy to request them.   This is the fastest and easiest way for you to obtain refills.   However, if a refill is not appropriate, one of our staff members or the pharmacy should contact you to let you know why it was denied.  Also, once you receive your after visit summary at the end of your  appointment, please CAREFULLY and THOROUGHLY read your current medication list.    If you find inaccurate information, or if you take your medications differently than what is documented (even over-the-counter ones) please be sure to call the office and speak to one of our medical assistants about it so this can be corrected in your chart.    Please note that you may receive a survey via email or snail mail about your experience today.  Please feel free to be open and honest about your experiences with us, as your input is valuable to us and will help us to improve our care and services.  Otherwise, please feel free to do a google review on [facility] or go to [email or web address] and click on the FACEBOOK link to leave your thoughts.  If you have any further questions or concerns please do not hesitate to contact us.    Thank you and we appreciate you choosing us as your primary care provider!    - Dr. Sharee Holsterpalski and 'the Team' at [facility]

## 2018-08-27 LAB — HEMOGLOBIN A1C
Est. average glucose Bld gHb Est-mCnc: 212 mg/dL
Hgb A1c MFr Bld: 9 % — ABNORMAL HIGH (ref 4.8–5.6)

## 2018-08-27 LAB — TSH: TSH: 0.281 u[IU]/mL — ABNORMAL LOW (ref 0.450–4.500)

## 2018-08-27 LAB — T4, FREE: Free T4: 1.41 ng/dL (ref 0.82–1.77)

## 2018-08-28 ENCOUNTER — Other Ambulatory Visit: Payer: Self-pay | Admitting: Family Medicine

## 2018-08-28 DIAGNOSIS — E039 Hypothyroidism, unspecified: Secondary | ICD-10-CM

## 2018-08-28 MED ORDER — LEVOTHYROXINE SODIUM 100 MCG PO TABS: 100.0000 ug | ORAL_TABLET | Freq: Every day | ORAL | 1 refills | Status: DC

## 2018-09-02 ENCOUNTER — Telehealth: Payer: Self-pay | Admitting: Family Medicine

## 2018-09-02 NOTE — Telephone Encounter (Signed)
Patient states [name] left her a message to call office.  --glh

## 2018-09-02 NOTE — Telephone Encounter (Signed)
Called patient left message to call the office. MPulliam, CMA/RT(R)

## 2018-09-04 ENCOUNTER — Other Ambulatory Visit: Payer: Self-pay

## 2018-09-04 DIAGNOSIS — E039 Hypothyroidism, unspecified: Secondary | ICD-10-CM

## 2018-09-04 MED ORDER — LEVOTHYROXINE SODIUM 100 MCG PO TABS: 100.0000 ug | ORAL_TABLET | Freq: Every day | ORAL | 0 refills | Status: DC

## 2018-09-15 ENCOUNTER — Emergency Department (HOSPITAL_COMMUNITY): Payer: PPO

## 2018-09-15 ENCOUNTER — Other Ambulatory Visit: Payer: Self-pay

## 2018-09-15 ENCOUNTER — Telehealth: Payer: Self-pay

## 2018-09-15 ENCOUNTER — Inpatient Hospital Stay (HOSPITAL_COMMUNITY)
Admission: EM | Admit: 2018-09-15 | Discharge: 2018-09-17 | DRG: 190 | Disposition: A | Discharge status: Home / Self Care | Payer: PPO | Attending: Internal Medicine | Admitting: Internal Medicine

## 2018-09-15 ENCOUNTER — Encounter (HOSPITAL_COMMUNITY): Payer: Self-pay

## 2018-09-15 DIAGNOSIS — J440 Chronic obstructive pulmonary disease with acute lower respiratory infection: Secondary | ICD-10-CM | POA: Diagnosis present

## 2018-09-15 DIAGNOSIS — Z79890 Hormone replacement therapy (postmenopausal): Secondary | ICD-10-CM | POA: Diagnosis not present

## 2018-09-15 DIAGNOSIS — J441 Chronic obstructive pulmonary disease with (acute) exacerbation: Principal | ICD-10-CM | POA: Diagnosis present

## 2018-09-15 DIAGNOSIS — Z888 Allergy status to other drugs, medicaments and biological substances status: Secondary | ICD-10-CM

## 2018-09-15 DIAGNOSIS — K7581 Nonalcoholic steatohepatitis (NASH): Secondary | ICD-10-CM | POA: Diagnosis present

## 2018-09-15 DIAGNOSIS — J849 Interstitial pulmonary disease, unspecified: Secondary | ICD-10-CM | POA: Diagnosis present

## 2018-09-15 DIAGNOSIS — J9611 Chronic respiratory failure with hypoxia: Secondary | ICD-10-CM | POA: Diagnosis present

## 2018-09-15 DIAGNOSIS — G4730 Sleep apnea, unspecified: Secondary | ICD-10-CM | POA: Diagnosis present

## 2018-09-15 DIAGNOSIS — R0602 Shortness of breath: Secondary | ICD-10-CM | POA: Diagnosis not present

## 2018-09-15 DIAGNOSIS — I252 Old myocardial infarction: Secondary | ICD-10-CM

## 2018-09-15 DIAGNOSIS — Z833 Family history of diabetes mellitus: Secondary | ICD-10-CM | POA: Diagnosis not present

## 2018-09-15 DIAGNOSIS — J42 Unspecified chronic bronchitis: Secondary | ICD-10-CM | POA: Diagnosis not present

## 2018-09-15 DIAGNOSIS — G4733 Obstructive sleep apnea (adult) (pediatric): Secondary | ICD-10-CM | POA: Diagnosis present

## 2018-09-15 DIAGNOSIS — J159 Unspecified bacterial pneumonia: Secondary | ICD-10-CM | POA: Diagnosis present

## 2018-09-15 DIAGNOSIS — E1069 Type 1 diabetes mellitus with other specified complication: Secondary | ICD-10-CM

## 2018-09-15 DIAGNOSIS — I5032 Chronic diastolic (congestive) heart failure: Secondary | ICD-10-CM | POA: Diagnosis present

## 2018-09-15 DIAGNOSIS — E1165 Type 2 diabetes mellitus with hyperglycemia: Secondary | ICD-10-CM

## 2018-09-15 DIAGNOSIS — K7460 Unspecified cirrhosis of liver: Secondary | ICD-10-CM | POA: Diagnosis present

## 2018-09-15 DIAGNOSIS — Z794 Long term (current) use of insulin: Secondary | ICD-10-CM | POA: Diagnosis not present

## 2018-09-15 DIAGNOSIS — R079 Chest pain, unspecified: Secondary | ICD-10-CM

## 2018-09-15 DIAGNOSIS — E039 Hypothyroidism, unspecified: Secondary | ICD-10-CM | POA: Diagnosis present

## 2018-09-15 DIAGNOSIS — Z955 Presence of coronary angioplasty implant and graft: Secondary | ICD-10-CM | POA: Diagnosis not present

## 2018-09-15 DIAGNOSIS — K219 Gastro-esophageal reflux disease without esophagitis: Secondary | ICD-10-CM | POA: Diagnosis present

## 2018-09-15 DIAGNOSIS — Z79899 Other long term (current) drug therapy: Secondary | ICD-10-CM

## 2018-09-15 DIAGNOSIS — J449 Chronic obstructive pulmonary disease, unspecified: Secondary | ICD-10-CM | POA: Diagnosis present

## 2018-09-15 DIAGNOSIS — Z880 Allergy status to penicillin: Secondary | ICD-10-CM

## 2018-09-15 DIAGNOSIS — Z9981 Dependence on supplemental oxygen: Secondary | ICD-10-CM

## 2018-09-15 DIAGNOSIS — E785 Hyperlipidemia, unspecified: Secondary | ICD-10-CM | POA: Diagnosis present

## 2018-09-15 DIAGNOSIS — Z6834 Body mass index (BMI) 34.0-34.9, adult: Secondary | ICD-10-CM

## 2018-09-15 DIAGNOSIS — J189 Pneumonia, unspecified organism: Secondary | ICD-10-CM | POA: Diagnosis not present

## 2018-09-15 DIAGNOSIS — I110 Hypertensive heart disease with heart failure: Secondary | ICD-10-CM | POA: Diagnosis present

## 2018-09-15 DIAGNOSIS — E782 Mixed hyperlipidemia: Secondary | ICD-10-CM

## 2018-09-15 DIAGNOSIS — I5042 Chronic combined systolic (congestive) and diastolic (congestive) heart failure: Secondary | ICD-10-CM | POA: Diagnosis present

## 2018-09-15 DIAGNOSIS — I2510 Atherosclerotic heart disease of native coronary artery without angina pectoris: Secondary | ICD-10-CM | POA: Diagnosis present

## 2018-09-15 DIAGNOSIS — Z87891 Personal history of nicotine dependence: Secondary | ICD-10-CM

## 2018-09-15 DIAGNOSIS — R9431 Abnormal electrocardiogram [ECG] [EKG]: Secondary | ICD-10-CM | POA: Diagnosis not present

## 2018-09-15 DIAGNOSIS — E118 Type 2 diabetes mellitus with unspecified complications: Secondary | ICD-10-CM

## 2018-09-15 DIAGNOSIS — I10 Essential (primary) hypertension: Secondary | ICD-10-CM | POA: Diagnosis not present

## 2018-09-15 DIAGNOSIS — Z882 Allergy status to sulfonamides status: Secondary | ICD-10-CM

## 2018-09-15 LAB — DIFFERENTIAL
Abs Immature Granulocytes: 0.02 10*3/uL (ref 0.00–0.07)
Basophils Absolute: 0 10*3/uL (ref 0.0–0.1)
Basophils Relative: 0 %
Eosinophils Absolute: 0 10*3/uL (ref 0.0–0.5)
Eosinophils Relative: 0 %
Immature Granulocytes: 0 %
Lymphocytes Relative: 10 %
Lymphs Abs: 0.6 10*3/uL — ABNORMAL LOW (ref 0.7–4.0)
Monocytes Absolute: 1 10*3/uL (ref 0.1–1.0)
Monocytes Relative: 16 %
Neutro Abs: 4.4 10*3/uL (ref 1.7–7.7)
Neutrophils Relative %: 74 %

## 2018-09-15 LAB — BRAIN NATRIURETIC PEPTIDE: B Natriuretic Peptide: 394.1 pg/mL — ABNORMAL HIGH (ref 0.0–100.0)

## 2018-09-15 LAB — CBC
HCT: 35.6 % — ABNORMAL LOW (ref 36.0–46.0)
Hemoglobin: 10.6 g/dL — ABNORMAL LOW (ref 12.0–15.0)
MCH: 24.9 pg — ABNORMAL LOW (ref 26.0–34.0)
MCHC: 29.8 g/dL — ABNORMAL LOW (ref 30.0–36.0)
MCV: 83.8 fL (ref 80.0–100.0)
Platelets: DECREASED 10*3/uL (ref 150–400)
RBC: 4.25 MIL/uL (ref 3.87–5.11)
RDW: 17.6 % — ABNORMAL HIGH (ref 11.5–15.5)
WBC: 5.7 10*3/uL (ref 4.0–10.5)
nRBC: 0 % (ref 0.0–0.2)

## 2018-09-15 LAB — BASIC METABOLIC PANEL
Anion gap: 9 (ref 5–15)
BUN: 8 mg/dL (ref 8–23)
CO2: 22 mmol/L (ref 22–32)
Calcium: 9 mg/dL (ref 8.9–10.3)
Chloride: 105 mmol/L (ref 98–111)
Creatinine, Ser: 0.86 mg/dL (ref 0.44–1.00)
GFR calc Af Amer: >60 mL/min (ref >60)
GFR calc non Af Amer: >60 mL/min (ref >60)
Glucose, Bld: 186 mg/dL — ABNORMAL HIGH (ref 70–99)
Potassium: 4.6 mmol/L (ref 3.5–5.1)
Sodium: 136 mmol/L (ref 135–145)

## 2018-09-15 LAB — I-STAT TROPONIN, ED: Troponin i, poc: 0.01 ng/mL (ref 0.00–0.08)

## 2018-09-15 LAB — LACTIC ACID, PLASMA: Lactic Acid, Venous: 1.7 mmol/L (ref 0.5–1.9)

## 2018-09-15 LAB — CBG MONITORING, ED: Glucose-Capillary: 123 mg/dL — ABNORMAL HIGH (ref 70–99)

## 2018-09-15 MED ORDER — ALBUTEROL SULFATE (2.5 MG/3ML) 0.083% IN NEBU: 3.0000 mL | INHALATION_SOLUTION | Freq: Four times a day (QID) | RESPIRATORY_TRACT | Status: DC | PRN

## 2018-09-15 MED ORDER — LEVOTHYROXINE SODIUM 100 MCG PO TABS
100.0000 ug | ORAL_TABLET | Freq: Every day | ORAL | Status: DC
  Administered 2018-09-16 – 2018-09-17 (×2): 100 ug via ORAL
  Filled 2018-09-15 (×2): qty 1

## 2018-09-15 MED ORDER — SODIUM CHLORIDE 0.9% FLUSH
3.0000 mL | Freq: Two times a day (BID) | INTRAVENOUS | Status: DC
  Administered 2018-09-16 (×2): 3 mL via INTRAVENOUS

## 2018-09-15 MED ORDER — VITAMIN D 25 MCG (1000 UNIT) PO TABS
2000.0000 [IU] | ORAL_TABLET | Freq: Every day | ORAL | Status: DC
  Administered 2018-09-16 – 2018-09-17 (×2): 2000 [IU] via ORAL
  Filled 2018-09-15 (×2): qty 2

## 2018-09-15 MED ORDER — SODIUM CHLORIDE 0.9% FLUSH: 3.0000 mL | INTRAVENOUS | Status: DC | PRN

## 2018-09-15 MED ORDER — SODIUM CHLORIDE 0.9 % IV SOLN
1.0000 g | INTRAVENOUS | Status: DC
  Administered 2018-09-16 (×2): 1 g via INTRAVENOUS
  Filled 2018-09-15 (×2): qty 10

## 2018-09-15 MED ORDER — INSULIN ASPART 100 UNIT/ML ~~LOC~~ SOLN
0.0000 [IU] | Freq: Three times a day (TID) | SUBCUTANEOUS | Status: DC
  Administered 2018-09-16: 9 [IU] via SUBCUTANEOUS
  Administered 2018-09-16: 5 [IU] via SUBCUTANEOUS
  Administered 2018-09-16: 7 [IU] via SUBCUTANEOUS
  Administered 2018-09-17: 1 [IU] via SUBCUTANEOUS

## 2018-09-15 MED ORDER — SODIUM CHLORIDE 0.9% FLUSH
3.0000 mL | Freq: Once | INTRAVENOUS | Status: AC
  Administered 2018-09-15: 3 mL via INTRAVENOUS

## 2018-09-15 MED ORDER — SODIUM CHLORIDE 0.9 % IV SOLN: 250.0000 mL | INTRAVENOUS | Status: DC | PRN

## 2018-09-15 MED ORDER — SODIUM CHLORIDE 0.9 % IV BOLUS: 500.0000 mL | Freq: Once | INTRAVENOUS | Status: DC

## 2018-09-15 MED ORDER — METHYLPREDNISOLONE SODIUM SUCC 125 MG IJ SOLR
125.0000 mg | Freq: Once | INTRAMUSCULAR | Status: AC
  Administered 2018-09-15: 125 mg via INTRAVENOUS
  Filled 2018-09-15: qty 2

## 2018-09-15 MED ORDER — SODIUM CHLORIDE 0.9 % IV BOLUS
500.0000 mL | Freq: Once | INTRAVENOUS | Status: AC
  Administered 2018-09-15: 500 mL via INTRAVENOUS

## 2018-09-15 MED ORDER — INSULIN GLARGINE 100 UNIT/ML ~~LOC~~ SOLN
30.0000 [IU] | Freq: Every day | SUBCUTANEOUS | Status: DC
  Administered 2018-09-16 – 2018-09-17 (×2): 30 [IU] via SUBCUTANEOUS
  Filled 2018-09-15 (×2): qty 0.3

## 2018-09-15 MED ORDER — SODIUM CHLORIDE 0.9 % IV SOLN
500.0000 mg | INTRAVENOUS | Status: DC
  Administered 2018-09-16 (×2): 500 mg via INTRAVENOUS
  Filled 2018-09-15 (×2): qty 500

## 2018-09-15 MED ORDER — ARTIFICIAL TEARS OPHTHALMIC OINT
1.0000 "application " | TOPICAL_OINTMENT | Freq: Every day | OPHTHALMIC | Status: DC
  Administered 2018-09-16: 1 via OPHTHALMIC
  Filled 2018-09-15: qty 3.5

## 2018-09-15 MED ORDER — CARBOXYMETH-GLYC-POLYSORB PF 0.5-1-0.5 % OP SOLN: 1.0000 [drp] | Freq: Two times a day (BID) | OPHTHALMIC | Status: DC

## 2018-09-15 MED ORDER — PROPYLENE GLYCOL 0.6 % OP SOLN: 1.0000 [drp] | Freq: Two times a day (BID) | OPHTHALMIC | Status: DC

## 2018-09-15 MED ORDER — ADULT MULTIVITAMIN W/MINERALS CH
1.0000 | ORAL_TABLET | Freq: Every day | ORAL | Status: DC
  Administered 2018-09-16 – 2018-09-17 (×2): 1 via ORAL
  Filled 2018-09-15 (×2): qty 1

## 2018-09-15 MED ORDER — IPRATROPIUM-ALBUTEROL 0.5-2.5 (3) MG/3ML IN SOLN
3.0000 mL | Freq: Once | RESPIRATORY_TRACT | Status: AC
  Administered 2018-09-15: 3 mL via RESPIRATORY_TRACT
  Filled 2018-09-15: qty 3

## 2018-09-15 MED ORDER — ARTIFICIAL TEARS OPHTHALMIC OINT: 1.0000 "application " | TOPICAL_OINTMENT | Freq: Every evening | OPHTHALMIC | Status: DC | PRN

## 2018-09-15 MED ORDER — POLYVINYL ALCOHOL 1.4 % OP SOLN
1.0000 [drp] | Freq: Four times a day (QID) | OPHTHALMIC | Status: DC
  Administered 2018-09-16 – 2018-09-17 (×5): 1 [drp] via OPHTHALMIC
  Filled 2018-09-15: qty 15

## 2018-09-15 MED ORDER — ESCITALOPRAM OXALATE 20 MG PO TABS
20.0000 mg | ORAL_TABLET | Freq: Every day | ORAL | Status: DC
  Administered 2018-09-16 – 2018-09-17 (×2): 20 mg via ORAL
  Filled 2018-09-15 (×3): qty 1

## 2018-09-15 MED ORDER — POTASSIUM CHLORIDE CRYS ER 10 MEQ PO TBCR
10.0000 meq | EXTENDED_RELEASE_TABLET | Freq: Two times a day (BID) | ORAL | Status: DC
  Administered 2018-09-16 – 2018-09-17 (×3): 10 meq via ORAL
  Filled 2018-09-15 (×3): qty 1

## 2018-09-15 MED ORDER — LOSARTAN POTASSIUM 50 MG PO TABS
25.0000 mg | ORAL_TABLET | Freq: Every day | ORAL | Status: DC
  Administered 2018-09-16 – 2018-09-17 (×2): 25 mg via ORAL
  Filled 2018-09-15 (×2): qty 1

## 2018-09-15 NOTE — Telephone Encounter (Signed)
Patient called while the office was closed for lunch and left a message.  Called the patient back.  She states that she is having SOB with no improvement with home O2.  Patient states that her chest feels tight.  Denies any cough or recent upper respiratory symptoms.  Patient is having some nausea and diarrhea.  The office does not have any open acute appointments today.  Advised the patient to go to the [facility] in [facility] today.   MPulliam, CMA/RT(R)

## 2018-09-15 NOTE — H&P (Signed)
History and Physical    [name]:[id number] DOB: Mar 19, 1934 DOA: 09/15/2018  PCP: Mellody Dance, DO  Patient coming from: Home  Chief Complaint: Shortness of breath  HPI: [name] is a 85 y.o. female with medical history significant of CHF, coronary artery disease, and morbid obesity, [name] cirrhosis of the liver, chronic respiratory failure on 2 L of oxygen at home comes in with 3 days of worsening shortness of breath.  Patient reports has been having some fevers and chills at home.  She is been coughing more than normal.  She denies any lower extremity swelling or edema or weight gain.  She denies any sick contacts.  She denies any wheezing at home.  Patient is being referred for admission for COPD exacerbation however she probably has pneumonia.  She is feeling better with the treatment she is received in the ED which include nebulizers and Solu-Medrol.  She denies any pain anywhere.  Review of Systems: As per HPI otherwise 10 point review of systems negative.   Past Medical History:  Diagnosis Date  . Cirrhosis, non-alcoholic ([place])   . Coronary artery disease    status post inferior wall myocardial infarction  . Diabetes mellitus   . Dyslipidemia   . Hypotension 08/02/2010   recent episodes of orthostatic hypotension  . Hypothyroidism   . Leg edema   . Myocardial infarct, old   . Neuropathy of hand    left hand numbness/neuropathy  . Obesity   . Sleep apnea     Past Surgical History:  Procedure Laterality Date  . ABDOMINAL HYSTERECTOMY    . CARDIAC CATHETERIZATION     The ejection fraction is around 50%  . CHOLECYSTECTOMY    . CORONARY STENT PLACEMENT    . ESOPHAGOGASTRODUODENOSCOPY N/A 12/12/2012   Procedure: ESOPHAGOGASTRODUODENOSCOPY (EGD);  Surgeon: [name], MD;  Location: [name];  Service: Endoscopy;  Laterality: N/A;  . FLEXIBLE SIGMOIDOSCOPY N/A 12/12/2012   Procedure: FLEXIBLE SIGMOIDOSCOPY;  Surgeon: [name], MD;  Location:  [facility];  Service: Endoscopy;  Laterality: N/A;  . TUBAL LIGATION    . WRIST SURGERY       reports that she quit smoking about 45 years ago. She has never used smokeless tobacco. She reports that she does not drink alcohol or use drugs.  Allergies  Allergen Reactions  . Ace Inhibitors Cough  . Benadryl [Diphenhydramine Hcl (Sleep)] Other (See Comments)    hypotension  . Penicillins Hives and Swelling    Swelling of arms Has patient had a PCN reaction causing immediate rash, facial/tongue/throat swelling, SOB or lightheadedness with hypotension: unknown Has patient had a PCN reaction causing severe rash involving mucus membranes or skin necrosis: no Has patient had a PCN reaction that required hospitalization: unknown Has patient had a PCN reaction occurring within the last 10 years: no, childhood allergy If all of the above answers are "NO", then may proceed with Cephalosporin use.   . Sulfonamide Derivatives Swelling    Family History  Problem Relation Age of Onset  . Emphysema Brother        non smoker  . Diabetes Brother   . Hyperlipidemia Brother   . Hypertension Brother   . Heart disease Father   . Heart attack Father   . Diabetes Father   . Hyperlipidemia Father   . Hypertension Father   . Diabetes Mother   . Diabetes Sister   . Hyperlipidemia Sister   . Hypertension Sister   . Heart  attack Daughter   . Diabetes Daughter   . Diabetes Son   . Suicidality Son   . Alcohol abuse Son   . Alcohol abuse Maternal Uncle   . Heart attack Paternal Grandfather   . Diabetes Daughter   . Cancer Son        melanoma  . Diabetes Son   . Diabetes Son     Prior to Admission medications   Medication Sig Start Date End Date Taking? Authorizing Provider  Carboxymeth-Glyc-Polysorb PF (REFRESH OPTIVE MEGA-3) 0.5-1-0.5 % SOLN Apply 1-2 drops to eye 2 (two) times daily.    Yes [provider]  carvedilol (COREG) 3.125 MG tablet TAKE ONE TABLET BY MOUTH TWO TIMES  DAILY WITH A MEAL Patient taking differently: Take 3.125 mg by mouth 2 (two) times daily with a meal.  05/21/18  Yes Opalski, [name], DO  Cholecalciferol (VITAMIN D3) 50 MCG (2000 UT) TABS Take 1 tablet by mouth daily.   Yes [provider]  escitalopram (LEXAPRO) 20 MG tablet TAKE 1 TABLET (20 MG TOTAL) BY MOUTH DAILY. 05/21/18  Yes Opalski, [name], DO  Fluticasone-Salmeterol (ADVAIR DISKUS) 250-50 MCG/DOSE AEPB INHALE 1 PUFF BY MOUTH TWICE DAILY Patient taking differently: Inhale 1 puff into the lungs 2 (two) times daily.  08/14/18  Yes Opalski, Deborah, DO  furosemide (LASIX) 40 MG tablet TAKE 1/2 A TABLET (20 MG TOTAL) BY MOUTH DAILY. Patient taking differently: Take 20 mg by mouth daily.  10/17/17  Yes Opalski, Deborah, DO  insulin aspart (NOVOLOG FLEXPEN) 100 UNIT/ML FlexPen INJECT 20 UNITS SUBCUTANEOUSLY THREE TIMES A DAY BEFORE MEALS; ADJUST AND INCREASE ACCORDINGLY. Patient taking differently: Inject 30 Units into the skin 3 (three) times daily with meals.  08/22/18  Yes Opalski, [name], DO  Insulin Glargine (LANTUS SOLOSTAR) 100 UNIT/ML Solostar Pen INJECT 64 UNITS SUBCUTANEOUSLY EVERY NIGHT AT BEDTIME AND 30 UNITS 12 HOURS LATER. Patient taking differently: Inject 30-50 Units into the skin See admin instructions. Inject 30 units in the morning and 50 units in the evening. 05/21/18  Yes Opalski, [name], DO  levothyroxine (SYNTHROID, LEVOTHROID) 100 MCG tablet Take 1 tablet (100 mcg total) by mouth daily. 09/04/18  Yes Opalski, [name], DO  losartan (COZAAR) 25 MG tablet Take 1 tablet (25 mg total) by mouth daily. 08/26/18  Yes Opalski, Deborah, DO  montelukast (SINGULAIR) 10 MG tablet TAKE 1 TABLET BY MOUTH EVERY EVENING. Patient taking differently: Take 10 mg by mouth daily.  08/22/18  Yes Opalski, [name], DO  Multiple Vitamins-Minerals (VISION FORMULA/LUTEIN) TABS Take 1 tablet by mouth daily.   Yes [provider]  nitroGLYCERIN (NITROSTAT) 0.4 MG SL tablet Place 1 tab under  the tongue every 5 minutes as needed for chest pain. Please schedule appt for further refills. 3rd/final attempt. 05/22/18  Yes Nahser, Wonda Cheng, MD  pantoprazole (PROTONIX) 20 MG tablet TAKE ONE TABLET BY MOUTH TWICE DAILY BEFORE A MEAL Patient taking differently: Take 20 mg by mouth 2 (two) times daily.  10/17/17  Yes Opalski, [name], DO  Polyethyl Glycol-Propyl Glycol (SYSTANE) 0.4-0.3 % SOLN Apply 1 drop to eye 4 (four) times daily.    Yes [provider]  potassium chloride (K-DUR,KLOR-CON) 10 MEQ tablet Take 1 tablet (10 mEq total) by mouth 2 (two) times daily. 08/22/18  Yes Opalski, Deborah, DO  PROAIR HFA 108 (90 Base) MCG/ACT inhaler INHALE 2 PUFFS BY MOUTH EVERY 6 HOURS AS NEEDED. PT MUST HAVE OFFICE VISIT FOR MORE REFILLS Patient taking differently: Inhale 2 puffs into the lungs every  6 (six) hours as needed for wheezing or shortness of breath.  05/21/18  Yes Opalski, [name], DO  Propylene Glycol (SYSTANE BALANCE) 0.6 % SOLN Apply 1 drop to eye 2 (two) times daily.   Yes [provider]  White Petrolatum-Mineral Oil (GENTEAL TEARS NIGHT-TIME) OINT Apply 1 application to eye at bedtime.   Yes [provider]  White Petrolatum-Mineral Oil (SYSTANE NIGHTTIME) OINT Place 1 application into both eyes at bedtime as needed (irritation).    Yes [provider]  Blood Glucose Monitoring Suppl (FREESTYLE LITE) DEVI Use to check blood sugars 2-3 times daily 01/07/17   Opalski, Deborah, DO  EASY TOUCH PEN NEEDLES 31G X 5 MM MISC USE TO INJECT INSULIN 5 TIMES A DAY 07/25/18   Opalski, Deborah, DO  fluconazole (DIFLUCAN) 150 MG tablet 1 p.o. x1 then repeat 1 week Patient taking differently: Take 150 mg by mouth once. then repeat 1 week 08/26/18   Mellody Dance, DO  ONE TOUCH ULTRA TEST test strip TEST BLOOD SUGAR 2-3 TIMES A DAY 05/01/18   Opalski, [name], DO  ONETOUCH DELICA LANCETS 61Y MISC USE TO CHECK BLOOD SUGAR THREE TIMES A DAY 11/14/17   Mellody Dance, DO     Physical Exam: Vitals:   09/15/18 1930 09/15/18 2000 09/15/18 2100 09/15/18 2200  BP: (!) 110/53 (!) 98/50 (!) 111/57 (!) 107/52  Pulse: 72 76 72 71  Resp: (!) 26 (!) 28 16 16   Temp:      TempSrc:      SpO2: 100% 100% 100% 100%  Weight:      Height:          Constitutional: NAD, calm, comfortable Vitals:   09/15/18 1930 09/15/18 2000 09/15/18 2100 09/15/18 2200  BP: (!) 110/53 (!) 98/50 (!) 111/57 (!) 107/52  Pulse: 72 76 72 71  Resp: (!) 26 (!) 28 16 16   Temp:      TempSrc:      SpO2: 100% 100% 100% 100%  Weight:      Height:       Eyes: PERRL, lids and conjunctivae normal ENMT: Mucous membranes are moist. Posterior pharynx clear of any exudate or lesions.Normal dentition.  Neck: normal, supple, no masses, no thyromegaly Respiratory: clear to auscultation bilaterally, no wheezing, no crackles. Normal respiratory effort. No accessory muscle use.  Cardiovascular: Regular rate and rhythm, no murmurs / rubs / gallops. No extremity edema. 2+ pedal pulses. No carotid bruits.  Abdomen: no tenderness, no masses palpated. No hepatosplenomegaly. Bowel sounds positive.  Musculoskeletal: no clubbing / cyanosis. No joint deformity upper and lower extremities. Good ROM, no contractures. Normal muscle tone.  Skin: no rashes, lesions, ulcers. No induration Neurologic: CN 2-12 grossly intact. Sensation intact, DTR normal. Strength 5/5 in all 4.  Psychiatric: Normal judgment and insight. Alert and oriented x 3. Normal mood.    Labs on Admission: I have personally reviewed following labs and imaging studies  CBC: Recent Labs  Lab 09/15/18 1417 09/15/18 1928  WBC 5.7  --   NEUTROABS  --  4.4  HGB 10.6*  --   HCT 35.6*  --   MCV 83.8  --   PLT PLATELET CLUMPS NOTED ON SMEAR, COUNT APPEARS DECREASED  --    Basic Metabolic Panel: Recent Labs  Lab 09/15/18 1417  NA 136  K 4.6  CL 105  CO2 22  GLUCOSE 186*  BUN 8  CREATININE 0.86  CALCIUM 9.0   GFR: Estimated  Creatinine Clearance: 47.3 mL/min (by C-G formula  based on SCr of 0.86 mg/dL). Liver Function Tests: No results for input(s): AST, ALT, ALKPHOS, BILITOT, PROT, ALBUMIN in the last 168 hours. No results for input(s): LIPASE, AMYLASE in the last 168 hours. No results for input(s): AMMONIA in the last 168 hours. Coagulation Profile: No results for input(s): INR, PROTIME in the last 168 hours. Cardiac Enzymes: No results for input(s): CKTOTAL, CKMB, CKMBINDEX, TROPONINI in the last 168 hours. BNP (last 3 results) No results for input(s): PROBNP in the last 8760 hours. HbA1C: No results for input(s): HGBA1C in the last 72 hours. CBG: Recent Labs  Lab 09/15/18 2003  GLUCAP 123*   Lipid Profile: No results for input(s): CHOL, HDL, LDLCALC, TRIG, CHOLHDL, LDLDIRECT in the last 72 hours. Thyroid Function Tests: No results for input(s): TSH, T4TOTAL, FREET4, T3FREE, THYROIDAB in the last 72 hours. Anemia Panel: No results for input(s): VITAMINB12, FOLATE, FERRITIN, TIBC, IRON, RETICCTPCT in the last 72 hours. Urine analysis:    Component Value Date/Time   COLORURINE YELLOW 01/13/2016 2026   APPEARANCEUR CLEAR 01/13/2016 2026   LABSPEC 1.009 01/13/2016 2026   PHURINE 6.5 01/13/2016 2026   GLUCOSEU 250 (A) 01/13/2016 2026   HGBUR NEGATIVE 01/13/2016 2026   BILIRUBINUR NEGATIVE 01/13/2016 2026   KETONESUR NEGATIVE 01/13/2016 2026   PROTEINUR NEGATIVE 01/13/2016 2026   UROBILINOGEN 0.2 12/12/2012 0920   NITRITE NEGATIVE 01/13/2016 2026   LEUKOCYTESUR NEGATIVE 01/13/2016 2026   Sepsis Labs: !!!!!!!!!!!!!!!!!!!!!!!!!!!!!!!!!!!!!!!!!!!! @LABRCNTIP (procalcitonin:4,lacticidven:4) )No results found for this or any previous visit (from the past 240 hour(s)).   Radiological Exams on Admission: Dg Chest 2 View  Result Date: 09/15/2018 CLINICAL DATA:  85 y/o  F; central chest pain and tightness. EXAM: CHEST - 2 VIEW COMPARISON:  01/13/2016 chest radiograph.  10/13/2012 CT chest. FINDINGS:  Stable mildly enlarged cardiac silhouette given projection and technique. Aortic atherosclerosis with calcification. Pulmonary venous hypertension. Bronchitic changes in the lung bases. No focal consolidation. No pleural effusion or pneumothorax. No acute osseous abnormality is evident. IMPRESSION: Pulmonary venous hypertension. Bronchitic changes in the lung bases. No focal consolidation. Electronically Signed   By: Kristine Garbe M.D.   On: 09/15/2018 15:01   Old chart reviewed Case discussed with Ms. Phylliss Bob in the ED Chest x-ray reviewed shows no focal infiltrate or edema  Assessment/Plan 85 year old female with community-acquired pneumonia Principal Problem:   PNA (pneumonia)-placed on IV Rocephin and azithromycin.  Obtain blood and sputum cultures.  Supplemental oxygen as needed.  On pneumonia pathway.  Active Problems:   Heart failure, diastolic, chronic (HCC)-stable and compensated at this time    COPD (chronic obstructive pulmonary disease) (HCC)-no wheezing at this time patient given IV Solu-Medrol emergency department will not continue at this time in the setting of likely infection as above.    ILD (interstitial lung disease) (HCC)-noted    Insulin dependent diabetes mellitus with complications (HCC)-continue her Lantus and placed on sliding scale insulin    Morbidly obese (HCC)- BMI >er 35 w DM-noted and stable     DVT prophylaxis: SCDs Code Status: Full Family Communication: None Disposition Plan: Days Consults called: None Admission status: Admission   Takari Lundahl A MD [facility]  If 7PM-7AM, please contact night-coverage [email or web address] Password [facility]  09/15/2018, 10:39 PM

## 2018-09-15 NOTE — ED Triage Notes (Signed)
Pt endorses shob with epigastric pain x 2 days. Has hx of chf and takes fluid pills. VSS.

## 2018-09-15 NOTE — Progress Notes (Addendum)
RT to room to administer neb tx. Pt a little dyspneic upon RT arrival, with diminished bbs. No wheeze noted at this time. Post tx, increased aeration noted bilaterally. Per family, pt wears 2L [place] PRN QHS.  RT will continue to monitor.

## 2018-09-15 NOTE — ED Provider Notes (Signed)
[facility] EMERGENCY DEPARTMENT Provider Note   CSN: [id number] Arrival date & time: 09/15/18  1406     History   Chief Complaint Chief Complaint  Patient presents with  . Shortness of Breath    HPI [name] is a 85 y.o. female with a past medical history of CHF, COPD, CAD, nonalcoholic cirrhosis, sleep apnea, who presents today for evaluation of shortness of breath, and chest pain.  History is obtained from patient and her daughters.  2 Fridays ago patient began having slightly increased shortness of breath.  This has significantly worsened over the past 4 days.  She reports compliance with her neb treatments, however according to her daughters does not regularly use her other inhalers, patient states that she does.  She also reports compliance with her Lasix.  She is reportedly had increased body wide swelling over the past few days.  She has not had temperatures over 100.4, she has reportedly had "low-grade fevers" according to family.  She reports that the chest pain feels like tightness, and inability to breathe.  Her chest pain has not moved or radiated.  She denies any nausea vomiting or diarrhea.  Denies worsening cough.  HPI  Past Medical History:  Diagnosis Date  . Cirrhosis, non-alcoholic (HCC)   . Coronary artery disease    status post inferior wall myocardial infarction  . Diabetes mellitus   . Dyslipidemia   . Hypotension 08/02/2010   recent episodes of orthostatic hypotension  . Hypothyroidism   . Leg edema   . Myocardial infarct, old   . Neuropathy of hand    left hand numbness/neuropathy  . Obesity   . Sleep apnea     Patient Active Problem List   Diagnosis Date Noted  . Microalbuminuria due to type 2 diabetes mellitus (HCC) 08/26/2018  . Hypocalcemia 03/12/2018  . Gastroesophageal reflux disease 12/31/2017  . chronic Low platelet count 06/05/2017  . Hypertension associated with diabetes (HCC) 05/22/2017  . Morbidly obese (HCC)-  BMI >er 35 w DM 06/23/2016  . chronic Leukopenia 05/11/2016  . Vitamin D deficiency 05/11/2016  . Elevated serum alkaline phosphatase level- hepatocellular in origin 05/11/2016  . Depression 03/13/2016  . Hypothyroidism 03/13/2016  . Insulin dependent diabetes mellitus with complications (HCC) 03/13/2016  . Mixed diabetic hyperlipidemia associated with type 1 diabetes mellitus (HCC) 03/13/2016  . Constipation, chronic 03/13/2016  . At high risk for osteoporosis 03/13/2016  . Hypotension 12/13/2012  . Hypokalemia 12/13/2012  . Thrombocytopenia, unspecified (HCC) 12/13/2012  . h/o Clostridium difficile colitis 12/12/2012  . History of GI bleed 12/12/2012  . ILD (interstitial lung disease) (HCC) 11/25/2012  . Coronary artery disease 04/11/2011  . COPD (chronic obstructive pulmonary disease) (HCC) 04/11/2011  . Obstructive sleep apnea 04/11/2011  . Heart failure, diastolic, chronic (HCC) 03/29/2008  . Atherosclerosis of carotid arteries 04/03/2006    Past Surgical History:  Procedure Laterality Date  . ABDOMINAL HYSTERECTOMY    . CARDIAC CATHETERIZATION     The ejection fraction is around 50%  . CHOLECYSTECTOMY    . CORONARY STENT PLACEMENT    . ESOPHAGOGASTRODUODENOSCOPY N/A 12/12/2012   Procedure: ESOPHAGOGASTRODUODENOSCOPY (EGD);  Surgeon: Florencia Reasons, MD;  Location: [facility];  Service: Endoscopy;  Laterality: N/A;  . FLEXIBLE SIGMOIDOSCOPY N/A 12/12/2012   Procedure: FLEXIBLE SIGMOIDOSCOPY;  Surgeon: Florencia Reasons, MD;  Location: [facility];  Service: Endoscopy;  Laterality: N/A;  . TUBAL LIGATION    . WRIST SURGERY       OB  History   No obstetric history on file.      Home Medications    Prior to Admission medications   Medication Sig Start Date End Date Taking? Authorizing Provider  Carboxymeth-Glyc-Polysorb PF (REFRESH OPTIVE MEGA-3) 0.5-1-0.5 % SOLN Apply 1-2 drops to eye 2 (two) times daily.    Yes [provider]  carvedilol (COREG) 3.125 MG  tablet TAKE ONE TABLET BY MOUTH TWO TIMES DAILY WITH A MEAL Patient taking differently: Take 3.125 mg by mouth 2 (two) times daily with a meal.  05/21/18  Yes Opalski, Gavin Pound, DO  Cholecalciferol (VITAMIN D3) 50 MCG (2000 UT) TABS Take 1 tablet by mouth daily.   Yes [provider]  escitalopram (LEXAPRO) 20 MG tablet TAKE 1 TABLET (20 MG TOTAL) BY MOUTH DAILY. 05/21/18  Yes Opalski, Gavin Pound, DO  Fluticasone-Salmeterol (ADVAIR DISKUS) 250-50 MCG/DOSE AEPB INHALE 1 PUFF BY MOUTH TWICE DAILY Patient taking differently: Inhale 1 puff into the lungs 2 (two) times daily.  08/14/18  Yes Opalski, Deborah, DO  furosemide (LASIX) 40 MG tablet TAKE 1/2 A TABLET (20 MG TOTAL) BY MOUTH DAILY. Patient taking differently: Take 20 mg by mouth daily.  10/17/17  Yes Opalski, Deborah, DO  insulin aspart (NOVOLOG FLEXPEN) 100 UNIT/ML FlexPen INJECT 20 UNITS SUBCUTANEOUSLY THREE TIMES A DAY BEFORE MEALS; ADJUST AND INCREASE ACCORDINGLY. Patient taking differently: Inject 30 Units into the skin 3 (three) times daily with meals.  08/22/18  Yes Opalski, Gavin Pound, DO  Insulin Glargine (LANTUS SOLOSTAR) 100 UNIT/ML Solostar Pen INJECT 64 UNITS SUBCUTANEOUSLY EVERY NIGHT AT BEDTIME AND 30 UNITS 12 HOURS LATER. Patient taking differently: Inject 30-50 Units into the skin See admin instructions. Inject 30 units in the morning and 50 units in the evening. 05/21/18  Yes Opalski, Gavin Pound, DO  levothyroxine (SYNTHROID, LEVOTHROID) 100 MCG tablet Take 1 tablet (100 mcg total) by mouth daily. 09/04/18  Yes Opalski, Gavin Pound, DO  losartan (COZAAR) 25 MG tablet Take 1 tablet (25 mg total) by mouth daily. 08/26/18  Yes Opalski, Deborah, DO  montelukast (SINGULAIR) 10 MG tablet TAKE 1 TABLET BY MOUTH EVERY EVENING. Patient taking differently: Take 10 mg by mouth daily.  08/22/18  Yes Opalski, Gavin Pound, DO  Multiple Vitamins-Minerals (VISION FORMULA/LUTEIN) TABS Take 1 tablet by mouth daily.   Yes [provider]  nitroGLYCERIN  (NITROSTAT) 0.4 MG SL tablet Place 1 tab under the tongue every 5 minutes as needed for chest pain. Please schedule appt for further refills. 3rd/final attempt. 05/22/18  Yes Nahser, Deloris Ping, MD  pantoprazole (PROTONIX) 20 MG tablet TAKE ONE TABLET BY MOUTH TWICE DAILY BEFORE A MEAL Patient taking differently: Take 20 mg by mouth 2 (two) times daily.  10/17/17  Yes Opalski, Gavin Pound, DO  Polyethyl Glycol-Propyl Glycol (SYSTANE) 0.4-0.3 % SOLN Apply 1 drop to eye 4 (four) times daily.    Yes [provider]  potassium chloride (K-DUR,KLOR-CON) 10 MEQ tablet Take 1 tablet (10 mEq total) by mouth 2 (two) times daily. 08/22/18  Yes Opalski, Deborah, DO  PROAIR HFA 108 (90 Base) MCG/ACT inhaler INHALE 2 PUFFS BY MOUTH EVERY 6 HOURS AS NEEDED. PT MUST HAVE OFFICE VISIT FOR MORE REFILLS Patient taking differently: Inhale 2 puffs into the lungs every 6 (six) hours as needed for wheezing or shortness of breath.  05/21/18  Yes Opalski, Gavin Pound, DO  Propylene Glycol (SYSTANE BALANCE) 0.6 % SOLN Apply 1 drop to eye 2 (two) times daily.   Yes [provider]  White Petrolatum-Mineral Oil (GENTEAL TEARS NIGHT-TIME) OINT Apply  1 application to eye at bedtime.   Yes [provider]  White Petrolatum-Mineral Oil (SYSTANE NIGHTTIME) OINT Place 1 application into both eyes at bedtime as needed (irritation).    Yes [provider]  Blood Glucose Monitoring Suppl (FREESTYLE LITE) DEVI Use to check blood sugars 2-3 times daily 01/07/17   Opalski, Deborah, DO  EASY TOUCH PEN NEEDLES 31G X 5 MM MISC USE TO INJECT INSULIN 5 TIMES A DAY 07/25/18   Opalski, Deborah, DO  fluconazole (DIFLUCAN) 150 MG tablet 1 p.o. x1 then repeat 1 week Patient taking differently: Take 150 mg by mouth once. then repeat 1 week 08/26/18   Thomasene Lotpalski, Deborah, DO  ONE TOUCH ULTRA TEST test strip TEST BLOOD SUGAR 2-3 TIMES A DAY 05/01/18   Opalski, Deborah, DO  ONETOUCH DELICA LANCETS 33G MISC USE TO CHECK BLOOD SUGAR THREE  TIMES A DAY 11/14/17   Thomasene Lotpalski, Deborah, DO    Family History Family History  Problem Relation Age of Onset  . Emphysema Brother        non smoker  . Diabetes Brother   . Hyperlipidemia Brother   . Hypertension Brother   . Heart disease Father   . Heart attack Father   . Diabetes Father   . Hyperlipidemia Father   . Hypertension Father   . Diabetes Mother   . Diabetes Sister   . Hyperlipidemia Sister   . Hypertension Sister   . Heart attack Daughter   . Diabetes Daughter   . Diabetes Son   . Suicidality Son   . Alcohol abuse Son   . Alcohol abuse Maternal Uncle   . Heart attack Paternal Grandfather   . Diabetes Daughter   . Cancer Son        melanoma  . Diabetes Son   . Diabetes Son     Social History Social History   Tobacco Use  . Smoking status: Former Smoker    Last attempt to quit: 08/20/1973    Years since quitting: 45.1  . Smokeless tobacco: Never Used  Substance Use Topics  . Alcohol use: No  . Drug use: No     Allergies   Ace inhibitors; Benadryl [diphenhydramine hcl (sleep)]; Penicillins; and Sulfonamide derivatives   Review of Systems Review of Systems  Constitutional: Positive for chills. Negative for fever.  HENT: Negative for congestion.   Respiratory: Positive for cough, chest tightness, shortness of breath and wheezing.   Cardiovascular: Positive for chest pain and leg swelling. Negative for palpitations.  Gastrointestinal: Negative for abdominal pain, diarrhea, nausea and vomiting.  Genitourinary: Negative for dysuria.  Musculoskeletal: Negative for back pain and neck pain.     Physical Exam Updated Vital Signs BP (!) 98/50   Pulse 76   Temp 98.1 F (36.7 C) (Oral)   Resp (!) 28   Ht 5' (1.524 m)   Wt 80.3 kg   SpO2 100%   BMI 34.57 kg/m   Physical Exam Vitals signs and nursing note reviewed.  Constitutional:      General: She is not in acute distress.    Appearance: She is well-developed.  HENT:     Head: Normocephalic  and atraumatic.  Eyes:     Conjunctiva/sclera: Conjunctivae normal.  Neck:     Musculoskeletal: Normal range of motion and neck supple.  Cardiovascular:     Rate and Rhythm: Normal rate and regular rhythm.     Heart sounds: No murmur.  Pulmonary:     Effort: Tachypnea and accessory muscle  usage present.     Breath sounds: Examination of the right-upper field reveals wheezing and rhonchi. Examination of the left-upper field reveals wheezing and rhonchi. Examination of the right-middle field reveals wheezing and rhonchi. Examination of the left-middle field reveals wheezing and rhonchi. Examination of the right-lower field reveals wheezing and rhonchi. Examination of the left-lower field reveals wheezing and rhonchi. Wheezing and rhonchi present. No decreased breath sounds.  Chest:     Chest wall: No tenderness.  Abdominal:     Palpations: Abdomen is soft.     Tenderness: There is no abdominal tenderness.  Musculoskeletal:     Right lower leg: She exhibits no tenderness. Edema present.     Left lower leg: She exhibits no tenderness. Edema present.  Skin:    General: Skin is warm and dry.  Neurological:     General: No focal deficit present.     Mental Status: She is alert.  Psychiatric:        Mood and Affect: Mood normal.        Behavior: Behavior normal.      ED Treatments / Results  Labs (all labs ordered are listed, but only abnormal results are displayed) Labs Reviewed  BASIC METABOLIC PANEL - Abnormal; Notable for the following components:      Result Value   Glucose, Bld 186 (*)    All other components within normal limits  CBC - Abnormal; Notable for the following components:   Hemoglobin 10.6 (*)    HCT 35.6 (*)    MCH 24.9 (*)    MCHC 29.8 (*)    RDW 17.6 (*)    All other components within normal limits  BRAIN NATRIURETIC PEPTIDE - Abnormal; Notable for the following components:   B Natriuretic Peptide 394.1 (*)    All other components within normal limits    DIFFERENTIAL - Abnormal; Notable for the following components:   Lymphs Abs 0.6 (*)    All other components within normal limits  CBG MONITORING, ED - Abnormal; Notable for the following components:   Glucose-Capillary 123 (*)    All other components within normal limits  LACTIC ACID, PLASMA  LACTIC ACID, PLASMA  TSH  I-STAT TROPONIN, ED  I-STAT TROPONIN, ED    EKG EKG Interpretation  Date/Time:  Monday September 15 2018 14:13:45 EST Ventricular Rate:  81 PR Interval:  192 QRS Duration: 92 QT Interval:  404 QTC Calculation: 469 R Axis:   94 Text Interpretation:  Normal sinus rhythm with sinus arrhythmia Rightward axis Low voltage QRS Possible Inferior infarct , age undetermined Abnormal ECG No significant change since last tracing Reconfirmed by Vanetta MuldersZackowski, Scott [phone]) on 09/15/2018 8:55:32 PM   Radiology Dg Chest 2 View  Result Date: 09/15/2018 CLINICAL DATA:  85 y/o  F; central chest pain and tightness. EXAM: CHEST - 2 VIEW COMPARISON:  01/13/2016 chest radiograph.  10/13/2012 CT chest. FINDINGS: Stable mildly enlarged cardiac silhouette given projection and technique. Aortic atherosclerosis with calcification. Pulmonary venous hypertension. Bronchitic changes in the lung bases. No focal consolidation. No pleural effusion or pneumothorax. No acute osseous abnormality is evident. IMPRESSION: Pulmonary venous hypertension. Bronchitic changes in the lung bases. No focal consolidation. Electronically Signed   By: Mitzi HansenLance  Furusawa-Stratton M.D.   On: 09/15/2018 15:01    Procedures Procedures (including critical care time)  Medications Ordered in ED Medications  sodium chloride flush (NS) 0.9 % injection 3 mL (3 mLs Intravenous Given 09/15/18 2011)  ipratropium-albuterol (DUONEB) 0.5-2.5 (3) MG/3ML nebulizer solution 3 mL (  3 mLs Nebulization Given 09/15/18 2010)  sodium chloride 0.9 % bolus 500 mL (500 mLs Intravenous New Bag/Given 09/15/18 2104)  methylPREDNISolone sodium succinate  (SOLU-MEDROL) 125 mg/2 mL injection 125 mg (125 mg Intravenous Given 09/15/18 2101)     Initial Impression / Assessment and Plan / ED Course  I have reviewed the triage vital signs and the nursing notes.  Pertinent labs & imaging results that were available during my care of the patient were reviewed by me and considered in my medical decision making (see chart for details).  Clinical Course as of Sep 16 2203  Mon Sep 15, 2018  2203 Spoke with Dr. Onalee Hua who will see patient.    [EH]    Clinical Course User Index [EH] [name], PA-C   Patient presents today for evaluation of shortness of breath for the past 3 days with associated chest tightness.  She is tachypneic here with mild accessory muscle use.  Labs are obtained, she does not have a significant leukocytosis.  Mild anemia with a hemoglobin of 10.6.  Opponent is not elevated.  Lactic acid is 1.7, not elevated.  Chest x-ray shows bronchiectasis changes in the bilateral bases with concern for pulmonary venous hypertension without focal consolidation.  She was given a DuoNeb, after which she reported very mild, if any, relief.  She was also treated with steroids.  She does not meet SIRS or sepsis criteria.   She is on her home oxygen.    She does report chest pain, she is moderate risk.    This patient was seen as a shared visit with Dr. Deretha Emory.  Patient will be admitted for shortness of breath, chest pain rule out.      Final Clinical Impressions(s) / ED Diagnoses   Final diagnoses:  Shortness of breath  Chest pain, unspecified type    ED Discharge Orders    None       Norman Clay 09/15/18 2255    Vanetta Mulders, MD 09/16/18 [phone]

## 2018-09-15 NOTE — ED Provider Notes (Addendum)
Medical screening examination/treatment/procedure(s) were conducted as a shared visit with non-physician practitioner(s) and myself.  I personally evaluated the patient during the encounter.  EKG Interpretation  Date/Time:  Monday September 15 2018 14:13:45 EST Ventricular Rate:  81 PR Interval:  192 QRS Duration: 92 QT Interval:  404 QTC Calculation: 469 R Axis:   94 Text Interpretation:  Normal sinus rhythm with sinus arrhythmia Rightward axis Low voltage QRS Possible Inferior infarct , age undetermined Abnormal ECG No significant change since last tracing Reconfirmed by Fredia Sorrow [phone]) on 09/15/2018 8:55:32 PM   Patient seen by me along with physician assistant.  Patient triage note stated patient been having shortness of breath and some epigastric pain for 2 days.  Patient has a history of CHF and takes fluid pills.  In addition patient used to be on oxygen at all times.  But over the last 3 years has not required that.  But here in the last few days she started using her oxygen all day long.  Here patient somewhat ill-appearing.  Vital signs not consistent was septic parameters.  But lactic acid is pending.  No leukocytosis.  No flulike symptoms.  Lungs may be with some rhonchi no significant wheezing.  Patient oxygen sats on oxygen are currently 100% not tachycardic.  But blood pressures have been trending down.  With the most recent ones being like 59-29 systolic.  Urinalysis is pending.  And as mentioned lactic acid is pending.  We will give patient 500 cc fluid bolus and re-assess her blood pressures.  In addition patient's initial troponin was normal. Patient's chest x-ray shows evidence of pulmonary venous hypertension.  Bronchitic changes in the lung bases.  Patient's past medical history is noted for coronary disease diabetes sleep apnea hypothyroidism and cirrhosis nonalcoholic.  In addition concerns for CHF.  Certainly patient does probably has COPD as well.  Patient  will require admission   Fredia Sorrow, MD 09/15/18 2059    Fredia Sorrow, MD 09/15/18 2059

## 2018-09-16 ENCOUNTER — Other Ambulatory Visit: Payer: Self-pay | Admitting: Family Medicine

## 2018-09-16 DIAGNOSIS — E118 Type 2 diabetes mellitus with unspecified complications: Secondary | ICD-10-CM

## 2018-09-16 DIAGNOSIS — J42 Unspecified chronic bronchitis: Secondary | ICD-10-CM

## 2018-09-16 DIAGNOSIS — K219 Gastro-esophageal reflux disease without esophagitis: Secondary | ICD-10-CM

## 2018-09-16 DIAGNOSIS — J849 Interstitial pulmonary disease, unspecified: Secondary | ICD-10-CM

## 2018-09-16 DIAGNOSIS — Z794 Long term (current) use of insulin: Secondary | ICD-10-CM

## 2018-09-16 DIAGNOSIS — I10 Essential (primary) hypertension: Secondary | ICD-10-CM

## 2018-09-16 LAB — BASIC METABOLIC PANEL
Anion gap: 8 (ref 5–15)
BUN: 9 mg/dL (ref 8–23)
CO2: 22 mmol/L (ref 22–32)
Calcium: 8.7 mg/dL — ABNORMAL LOW (ref 8.9–10.3)
Chloride: 106 mmol/L (ref 98–111)
Creatinine, Ser: 0.9 mg/dL (ref 0.44–1.00)
GFR calc Af Amer: >60 mL/min (ref >60)
GFR calc non Af Amer: 60 mL/min — ABNORMAL LOW (ref >60)
Glucose, Bld: 126 mg/dL — ABNORMAL HIGH (ref 70–99)
Potassium: 4.3 mmol/L (ref 3.5–5.1)
Sodium: 136 mmol/L (ref 135–145)

## 2018-09-16 LAB — EXPECTORATED SPUTUM ASSESSMENT W REFEX TO RESP CULTURE

## 2018-09-16 LAB — RESPIRATORY PANEL BY PCR

## 2018-09-16 LAB — GLUCOSE, CAPILLARY
Glucose-Capillary: 273 mg/dL — ABNORMAL HIGH (ref 70–99)
Glucose-Capillary: 357 mg/dL — ABNORMAL HIGH (ref 70–99)
Glucose-Capillary: 342 mg/dL — ABNORMAL HIGH (ref 70–99)
Glucose-Capillary: 280 mg/dL — ABNORMAL HIGH (ref 70–99)

## 2018-09-16 LAB — EXPECTORATED SPUTUM ASSESSMENT W GRAM STAIN, RFLX TO RESP C

## 2018-09-16 LAB — STREP PNEUMONIAE URINARY ANTIGEN: Strep Pneumo Urinary Antigen: NEGATIVE

## 2018-09-16 LAB — CBC
HCT: 33.4 % — ABNORMAL LOW (ref 36.0–46.0)
Hemoglobin: 9.9 g/dL — ABNORMAL LOW (ref 12.0–15.0)
MCH: 24.7 pg — ABNORMAL LOW (ref 26.0–34.0)
MCHC: 29.6 g/dL — ABNORMAL LOW (ref 30.0–36.0)
MCV: 83.3 fL (ref 80.0–100.0)
Platelets: 53 10*3/uL — ABNORMAL LOW (ref 150–400)
RBC: 4.01 MIL/uL (ref 3.87–5.11)
RDW: 17.8 % — ABNORMAL HIGH (ref 11.5–15.5)
WBC: 3 10*3/uL — ABNORMAL LOW (ref 4.0–10.5)
nRBC: 0 % (ref 0.0–0.2)

## 2018-09-16 LAB — CBG MONITORING, ED: Glucose-Capillary: 114 mg/dL — ABNORMAL HIGH (ref 70–99)

## 2018-09-16 MED ORDER — INSULIN GLARGINE 100 UNIT/ML ~~LOC~~ SOLN
50.0000 [IU] | Freq: Every day | SUBCUTANEOUS | Status: DC
  Administered 2018-09-16: 50 [IU] via SUBCUTANEOUS
  Filled 2018-09-16 (×3): qty 0.5

## 2018-09-16 MED ORDER — ACETAMINOPHEN 500 MG PO TABS
1000.0000 mg | ORAL_TABLET | Freq: Four times a day (QID) | ORAL | Status: DC | PRN
  Administered 2018-09-16: 1000 mg via ORAL
  Filled 2018-09-16 (×2): qty 2

## 2018-09-16 MED ORDER — ORAL CARE MOUTH RINSE: 15.0000 mL | Freq: Two times a day (BID) | OROMUCOSAL | Status: DC

## 2018-09-16 MED ORDER — ACETAMINOPHEN 500 MG PO TABS
1000.0000 mg | ORAL_TABLET | Freq: Once | ORAL | Status: AC
  Administered 2018-09-16: 1000 mg via ORAL

## 2018-09-16 NOTE — Progress Notes (Signed)
Pt arrived to [address] 18. Alert and oriented x 4, identified appropriately, ambulated to the bathroom. Steady gate, dyspnea with excertion.  Pt oriented to room and equipment, instructed to use call bell for assistance. Pt verbalized understanding, bed alarm activated and call bell left within reach. Pt in no distress, will continue to monitor pt.

## 2018-09-16 NOTE — ED Notes (Signed)
Attempted to call report; advised that the charge nurse has not assigned room for a primary nurse.

## 2018-09-16 NOTE — Progress Notes (Signed)
Page MD [place] at [phone] to inform of walking O2 results. NT completed walking O2 and communicated oxygen level 96-97% after began walking, but saturation decreased to 87% after a couple minutes of walking without oxygen.

## 2018-09-16 NOTE — Progress Notes (Signed)
PROGRESS NOTE    [name]  PQD:[id number] DOB: 1933-09-09 DOA: 09/15/2018 PCP: Mellody Dance, DO   Brief Narrative:   [name] is a 85 y.o. female with medical history significant of CHF, coronary artery disease, and morbid obesity, [name] cirrhosis of the liver, chronic respiratory failure on 2 L of oxygen at home comes in with 3 days of worsening shortness of breath.    Patient reports episodes of fever/chills with increased cough.  No lower extremity swelling or weight gain.  No sick contacts.  Treated in the ED with nebs, IV steroids, and antibiotics.  She was referred for admission for COPD exacerbation versus pneumonia.  Assessment & Plan:   Principal Problem:   PNA (pneumonia) Active Problems:   Heart failure, diastolic, chronic (HCC)   COPD (chronic obstructive pulmonary disease) (HCC)   ILD (interstitial lung disease) (HCC)   Insulin dependent diabetes mellitus with complications (HCC)   Morbidly obese (HCC)- BMI >er 35 w DM   Pneumonia, suspect gram-negative organism versus viral Patient presenting with persistent shortness of breath associated fevers and chills with productive cough.  Lactic acid 1.7.  WBC count 3.0.  Requiring supplemental oxygen to maintain SPO2; usually only on as needed and nocturnal oxygen at 2.5 L.  Chest x-ray without focal consolidation. --Respiratory viral panel: Negative --Blood cultures x2: Pending --Strep pneumo antigen: Pending --Continue antibiotics with ceftriaxone and azithromycin; likely can discharge home in next 1-2 days with oral antibiotics to complete a 7-10-day course. --Attempt 6-minute walk test off of oxygen today to assess for home O2 needs  Insulin-dependent diabetes mellitus Hemoglobin A1c 9.0 on 08/26/2018, not optimally controlled. --Continue home Lantus 30 units every morning and 50 units nightly --Insulin sliding scale for coverage --Will need further optimization outpatient by PCP  Hypothyroidism: Continue  Synthroid 100 micrograms p.o. daily  COPD (chronic obstructive pulmonary disease) Interstitial lung disease Patient received dose of Solu-Medrol in the ED.  No wheezing appreciated on exam, not in acute exacerbation. --nebs prn  Essential hypertension BP well controlled.  Continue losartan 25 mg daily  Morbid obesity BMI 34.5.  Discussed with patient need for weight loss as this complicates all facets of care.   DVT prophylaxis: SCD's Code Status: Full code Family Communication: None Disposition Plan: Plan discharge home in likely 1-2 days   Antimicrobials:  Ceftriaxone 1g IV q24h (start: 1/27) Azithromycin 500 mg IV q24h (start: 1/27)   Subjective:  Patient doing well this morning, continues with some mild shortness of breath but improved.  She is currently on 2 L nasal cannula which he is usually only on 2.5 L nocturnally at home.  Denies headache, no visual changes, no chest pain, no palpitations, abdominal discomfort, no weakness, no paresthesias.  Objective: Vitals:   09/16/18 0000 09/16/18 0030 09/16/18 0312 09/16/18 0609  BP: (!) 103/33 115/63 111/64 (!) 108/47  Pulse: (!) 40 80 78 78  Resp: 13 (!) 22 (!) 22 18  Temp:    98.3 F (36.8 C)  TempSrc:    Oral  SpO2: 94% 100% 100% 100%  Weight:      Height:        Intake/Output Summary (Last 24 hours) at 09/16/2018 1413 Last data filed at 09/16/2018 0900 Gross per 24 hour  Intake 420 ml  Output -  Net 420 ml   Filed Weights   09/15/18 1416  Weight: 80.3 kg    Examination:  General exam: Appears calm and comfortable  Respiratory system: Clear to auscultation. Respiratory  effort normal. Cardiovascular system: S1 & S2 heard, RRR. No JVD, murmurs, rubs, gallops or clicks. No pedal edema. Gastrointestinal system: Abdomen is nondistended, soft and nontender. No organomegaly or masses felt. Normal bowel sounds heard. Central nervous system: Alert and oriented. No focal neurological deficits. Extremities:  Symmetric 5 x 5 power. Skin: No rashes, lesions or ulcers Psychiatry: Judgement and insight appear normal. Mood & affect appropriate.     Data Reviewed: I have personally reviewed following labs and imaging studies  CBC: Recent Labs  Lab 09/15/18 1417 09/15/18 1928 09/16/18 0201  WBC 5.7  --  3.0*  NEUTROABS  --  4.4  --   HGB 10.6*  --  9.9*  HCT 35.6*  --  33.4*  MCV 83.8  --  83.3  PLT PLATELET CLUMPS NOTED ON SMEAR, COUNT APPEARS DECREASED  --  53*   Basic Metabolic Panel: Recent Labs  Lab 09/15/18 1417 09/16/18 0201  NA 136 136  K 4.6 4.3  CL 105 106  CO2 22 22  GLUCOSE 186* 126*  BUN 8 9  CREATININE 0.86 0.90  CALCIUM 9.0 8.7*   GFR: Estimated Creatinine Clearance: 45.2 mL/min (by C-G formula based on SCr of 0.9 mg/dL). Liver Function Tests: No results for input(s): AST, ALT, ALKPHOS, BILITOT, PROT, ALBUMIN in the last 168 hours. No results for input(s): LIPASE, AMYLASE in the last 168 hours. No results for input(s): AMMONIA in the last 168 hours. Coagulation Profile: No results for input(s): INR, PROTIME in the last 168 hours. Cardiac Enzymes: No results for input(s): CKTOTAL, CKMB, CKMBINDEX, TROPONINI in the last 168 hours. BNP (last 3 results) No results for input(s): PROBNP in the last 8760 hours. HbA1C: No results for input(s): HGBA1C in the last 72 hours. CBG: Recent Labs  Lab 09/15/18 2003 09/16/18 0052 09/16/18 0815 09/16/18 1158  GLUCAP 123* 114* 273* 357*   Lipid Profile: No results for input(s): CHOL, HDL, LDLCALC, TRIG, CHOLHDL, LDLDIRECT in the last 72 hours. Thyroid Function Tests: No results for input(s): TSH, T4TOTAL, FREET4, T3FREE, THYROIDAB in the last 72 hours. Anemia Panel: No results for input(s): VITAMINB12, FOLATE, FERRITIN, TIBC, IRON, RETICCTPCT in the last 72 hours. Sepsis Labs: Recent Labs  Lab 09/15/18 2117  LATICACIDVEN 1.7    Recent Results (from the past 240 hour(s))  Respiratory Panel by PCR     Status:  None   Collection Time: 09/15/18 10:51 PM  Result Value Ref Range Status   Adenovirus NOT DETECTED NOT DETECTED Final   Coronavirus 229E NOT DETECTED NOT DETECTED Final   Coronavirus HKU1 NOT DETECTED NOT DETECTED Final   Coronavirus NL63 NOT DETECTED NOT DETECTED Final   Coronavirus OC43 NOT DETECTED NOT DETECTED Final   Metapneumovirus NOT DETECTED NOT DETECTED Final   Rhinovirus / Enterovirus NOT DETECTED NOT DETECTED Final   Influenza A NOT DETECTED NOT DETECTED Final   Influenza B NOT DETECTED NOT DETECTED Final   Parainfluenza Virus 1 NOT DETECTED NOT DETECTED Final   Parainfluenza Virus 2 NOT DETECTED NOT DETECTED Final   Parainfluenza Virus 3 NOT DETECTED NOT DETECTED Final   Parainfluenza Virus 4 NOT DETECTED NOT DETECTED Final   Respiratory Syncytial Virus NOT DETECTED NOT DETECTED Final   Bordetella pertussis NOT DETECTED NOT DETECTED Final   Chlamydophila pneumoniae NOT DETECTED NOT DETECTED Final   Mycoplasma pneumoniae NOT DETECTED NOT DETECTED Final    Comment: Performed at [facility], [address] [place],  [address]  Culture, blood (routine x 2) Call  MD if unable to obtain prior to antibiotics being given     Status: None (Preliminary result)   Collection Time: 09/16/18  1:39 AM  Result Value Ref Range Status   Specimen Description   Final    BLOOD RIGHT ANTECUBITAL Performed at [facility], [place] [address] [place], [place] [address]    Special Requests   Final    BOTTLES DRAWN AEROBIC AND ANAEROBIC Blood Culture results may not be optimal due to an inadequate volume of blood received in culture bottles   Culture PENDING  Incomplete   Report Status PENDING  Incomplete  Culture, blood (routine x 2) Call MD if unable to obtain prior to antibiotics being given     Status: None (Preliminary result)   Collection Time: 09/16/18  1:43 AM  Result Value Ref Range Status   Specimen Description   Final    BLOOD LEFT HAND Performed at [facility], [place] [address] [place], [place] [address]    Special Requests   Final    BOTTLES DRAWN AEROBIC AND ANAEROBIC Blood Culture results may not be optimal due to an inadequate volume of blood received in culture bottles   Culture PENDING  Incomplete   Report Status PENDING  Incomplete         Radiology Studies: Dg Chest 2 View  Result Date: 09/15/2018 CLINICAL DATA:  85 y/o  F; central chest pain and tightness. EXAM: CHEST - 2 VIEW COMPARISON:  01/13/2016 chest radiograph.  10/13/2012 CT chest. FINDINGS: Stable mildly enlarged cardiac silhouette given projection and technique. Aortic atherosclerosis with calcification. Pulmonary venous hypertension. Bronchitic changes in the lung bases. No focal consolidation. No pleural effusion or pneumothorax. No acute osseous abnormality is evident. IMPRESSION: Pulmonary venous hypertension. Bronchitic changes in the lung bases. No focal consolidation. Electronically Signed   By: Kristine Garbe M.D.   On: 09/15/2018 15:01        Scheduled Meds: . artificial tears  1 application Both Eyes QHS  . cholecalciferol  2,000 Units Oral Daily  . escitalopram  20 mg Oral Daily  . insulin aspart  0-9 Units Subcutaneous TID WC  . insulin glargine  30 Units Subcutaneous Daily  . insulin glargine  50 Units Subcutaneous QHS  . levothyroxine  100 mcg Oral Q0600  . losartan  25 mg Oral Daily  . multivitamin with minerals  1 tablet Oral Daily  . polyvinyl alcohol  1 drop Both Eyes QID  . potassium chloride  10 mEq Oral BID  . sodium chloride flush  3 mL Intravenous Q12H   Continuous Infusions: . sodium chloride    . azithromycin Stopped (09/16/18 0448)  . cefTRIAXone (ROCEPHIN)  IV Stopped (09/16/18 0238)     LOS: 1 day    Time spent: 26 min    Adaijah Endres J [place] MD [facility] Pager [phone]  If 7PM-7AM, please contact night-coverage [email or web address] Password [facility] 09/16/2018, 2:13 PM

## 2018-09-16 NOTE — Progress Notes (Signed)
Inpatient Diabetes Program Recommendations  AACE/ADA: New Consensus Statement on Inpatient Glycemic Control (2015)  Target Ranges:  Prepandial:   less than 140 mg/dL      Peak postprandial:   less than 180 mg/dL (1-2 hours)      Critically ill patients:  140 - 180 mg/dL   Lab Results  Component Value Date   GLUCAP 273 (H) 09/16/2018   HGBA1C 9.0 (H) 08/26/2018    Review of Glycemic Control Results for [name], [name] (MRN [id number]) as of 09/16/2018 11:38  Ref. Range 09/15/2018 20:03 09/16/2018 00:52 09/16/2018 08:15  Glucose-Capillary Latest Ref Range: 70 - 99 mg/dL 378 (H) 588 (H) 502 (H)   Diabetes history: Type 2 DM Outpatient Diabetes medications: Novolog 30 units TID, Lantus 50 units QHS, Lantus 30 units QD,  Current orders for Inpatient glycemic control: Novolog 30 units TID, Lantus 50 units QHS, Lantus 30 units QD, Novolog 0-9 units TID   Inpatient Diabetes Program Recommendations:    Noted Solumedrol 125 mg administered x 1, thus anticipate CBG trends to increase.   If post prandials continue to exceed 180 mg/dL, could restart a portion of patient's home meal coverage. Consider Novolog 4 units TID (assuming that patient is consuming >50% of meal).   Thanks, Lujean Rave, MSN, RNC-OB Diabetes Coordinator [phone] (8a-5p)

## 2018-09-17 DIAGNOSIS — J441 Chronic obstructive pulmonary disease with (acute) exacerbation: Principal | ICD-10-CM

## 2018-09-17 LAB — GLUCOSE, CAPILLARY
Glucose-Capillary: 145 mg/dL — ABNORMAL HIGH (ref 70–99)
Glucose-Capillary: 185 mg/dL — ABNORMAL HIGH (ref 70–99)

## 2018-09-17 MED ORDER — AZITHROMYCIN 500 MG PO TABS: 500.0000 mg | ORAL_TABLET | Freq: Every day | ORAL | 0 refills | Status: AC

## 2018-09-17 MED ORDER — INSULIN ASPART 100 UNIT/ML FLEXPEN: 30.0000 [IU] | PEN_INJECTOR | Freq: Three times a day (TID) | SUBCUTANEOUS | Status: DC

## 2018-09-17 MED ORDER — INSULIN GLARGINE 100 UNIT/ML SOLOSTAR PEN: 30.0000 [IU] | PEN_INJECTOR | SUBCUTANEOUS | Status: DC

## 2018-09-17 MED ORDER — AZITHROMYCIN 500 MG PO TABS: 500.0000 mg | ORAL_TABLET | Freq: Every day | ORAL | Status: DC

## 2018-09-17 MED ORDER — PREDNISONE 20 MG PO TABS: 40.0000 mg | ORAL_TABLET | Freq: Every day | ORAL | 0 refills | Status: AC

## 2018-09-17 NOTE — Discharge Summary (Signed)
Physician Discharge Summary  [name]:[id number] DOB: 09/26/1933 DOA: 09/15/2018  PCP: Mellody Dance, DO  Admit date: 09/15/2018 Discharge date: 09/17/2018  Admitted From: Home Disposition:  Home  Recommendations for Outpatient Follow-up:  1. Follow up with PCP in 1 week 2. Follow-up in the ED if symptoms worsen or new appear   Home Health: No Equipment/Devices: Oxygen via nasal cannula at 2-2.5 l/min  Discharge Condition: Stable CODE STATUS: Full Diet recommendation: Heart Healthy / Carb Modified   Brief/Interim Summary: 85 year old female with history of CHF, coronary artery disease, morbid obesity, [name] cirrhosis of liver, chronic respiratory failure on 2-2.5 l of oxygen at home as needed and at night presented with worsening shortness of breath.  She was started on IV antibiotics.  Chest x-ray was negative for infiltrates.  She received a dose of Solu-Medrol in the ED.  Her respiratory status has improved.  She still requiring oxygen during daytime.  She will be discharged home on oral prednisone and Zithromax.  Discharge Diagnoses:  Principal Problem:   PNA (pneumonia) Active Problems:   Heart failure, diastolic, chronic (HCC)   COPD (chronic obstructive pulmonary disease) (HCC)   ILD (interstitial lung disease) (HCC)   Insulin dependent diabetes mellitus with complications (HCC)   Morbidly obese (HCC)- BMI >er 35 w DM  Probable COPD exacerbation -Treated with Solu-Medrol in the ED.  Currently not wheezing.  Respiratory status has improved.  Discharge home on oral prednisone 40 mg daily for 5 days.  Continue home inhaler regimen -Patient was empirically started on Rocephin and Zithromax.  Chest x-ray was negative for infiltrates.  It does not seem that patient had a bacterial pneumonia but might have had viral bronchitis.  Will discharge home on oral Zithromax for anti-inflammatory properties.  Chronic hypoxic respiratory failure -Currently requiring 2 L oxygen  even during daytime.  Patient normally uses 2 to 2.5 L oxygen at nighttime and as needed during daytime.  For now patient will require oxygen round-the-clock to 2.5 L/min via nasal cannula.  Outpatient follow-up with PCP  Diabetes mellitus type 2 uncontrolled with hyperglycemia -A1c was 9 on 08/26/2018. -Outpatient follow-up.  Resume home regimen  Hypothyroidism -Continue Synthroid  Essential hypertension -Outpatient follow-up.  Continue losartan.  Obesity -Outpatient follow-up  Discharge Instructions  Discharge Instructions    Call MD for:  difficulty breathing, headache or visual disturbances   Complete by:  As directed    Call MD for:  extreme fatigue   Complete by:  As directed    Call MD for:  hives   Complete by:  As directed    Call MD for:  persistant dizziness or light-headedness   Complete by:  As directed    Call MD for:  persistant nausea and vomiting   Complete by:  As directed    Call MD for:  severe uncontrolled pain   Complete by:  As directed    Call MD for:  temperature >100.4   Complete by:  As directed    Diet - low sodium heart healthy   Complete by:  As directed    Diet Carb Modified   Complete by:  As directed    Increase activity slowly   Complete by:  As directed      Allergies as of 09/17/2018      Reactions   Ace Inhibitors Cough   Benadryl [diphenhydramine Hcl (sleep)] Other (See Comments)   hypotension   Penicillins Hives, Swelling   Swelling of arms Has patient had a  PCN reaction causing immediate rash, facial/tongue/throat swelling, SOB or lightheadedness with hypotension: unknown Has patient had a PCN reaction causing severe rash involving mucus membranes or skin necrosis: no Has patient had a PCN reaction that required hospitalization: unknown Has patient had a PCN reaction occurring within the last 10 years: no, childhood allergy If all of the above answers are "NO", then may proceed with Cephalosporin use.   Sulfonamide Derivatives  Swelling      Medication List    STOP taking these medications   fluconazole 150 MG tablet Commonly known as:  DIFLUCAN     TAKE these medications   azithromycin 500 MG tablet Commonly known as:  ZITHROMAX Take 1 tablet (500 mg total) by mouth daily for 5 doses.   carvedilol 3.125 MG tablet Commonly known as:  COREG TAKE ONE TABLET BY MOUTH TWO TIMES DAILY WITH A MEAL What changed:  See the new instructions.   EASY TOUCH PEN NEEDLES 31G X 5 MM Misc Generic drug:  Insulin Pen Needle USE TO INJECT INSULIN 5 TIMES A DAY   escitalopram 20 MG tablet Commonly known as:  LEXAPRO TAKE 1 TABLET (20 MG TOTAL) BY MOUTH DAILY.   Fluticasone-Salmeterol 250-50 MCG/DOSE Aepb Commonly known as:  ADVAIR DISKUS INHALE 1 PUFF BY MOUTH TWICE DAILY What changed:    how much to take  how to take this  when to take this  additional instructions   FREESTYLE LITE Devi Use to check blood sugars 2-3 times daily   furosemide 40 MG tablet Commonly known as:  LASIX TAKE 1/2 A TABLET (20 MG TOTAL) BY MOUTH DAILY. What changed:  See the new instructions.   insulin aspart 100 UNIT/ML FlexPen Commonly known as:  NOVOLOG FLEXPEN Inject 30 Units into the skin 3 (three) times daily with meals.   Insulin Glargine 100 UNIT/ML Solostar Pen Commonly known as:  LANTUS SOLOSTAR Inject 30-50 Units into the skin See admin instructions. Inject 30 units in the morning and 50 units in the evening.   levothyroxine 100 MCG tablet Commonly known as:  SYNTHROID, LEVOTHROID Take 1 tablet (100 mcg total) by mouth daily.   losartan 25 MG tablet Commonly known as:  COZAAR Take 1 tablet (25 mg total) by mouth daily.   montelukast 10 MG tablet Commonly known as:  SINGULAIR TAKE 1 TABLET BY MOUTH EVERY EVENING. What changed:  when to take this   nitroGLYCERIN 0.4 MG SL tablet Commonly known as:  NITROSTAT Place 1 tab under the tongue every 5 minutes as needed for chest pain. Please schedule appt for  further refills. 3rd/final attempt.   ONE TOUCH ULTRA TEST test strip Generic drug:  glucose blood TEST BLOOD SUGAR 2-3 TIMES A DAY   ONETOUCH DELICA LANCETS 62U Misc USE TO CHECK BLOOD SUGAR THREE TIMES A DAY   pantoprazole 20 MG tablet Commonly known as:  PROTONIX TAKE ONE TABLET BY MOUTH TWICE DAILY BEFORE A MEAL What changed:  See the new instructions.   potassium chloride 10 MEQ tablet Commonly known as:  K-DUR,KLOR-CON Take 1 tablet (10 mEq total) by mouth 2 (two) times daily.   predniSONE 20 MG tablet Commonly known as:  DELTASONE Take 2 tablets (40 mg total) by mouth daily for 5 days.   PROAIR HFA 108 (90 Base) MCG/ACT inhaler Generic drug:  albuterol INHALE 2 PUFFS BY MOUTH EVERY 6 HOURS AS NEEDED. PT MUST HAVE OFFICE VISIT FOR MORE REFILLS What changed:  See the new instructions.   REFRESH OPTIVE MEGA-3  0.5-1-0.5 % Soln Generic drug:  Carboxymeth-Glyc-Polysorb PF Apply 1-2 drops to eye 2 (two) times daily.   SYSTANE 0.4-0.3 % Soln Generic drug:  Polyethyl Glycol-Propyl Glycol Apply 1 drop to eye 4 (four) times daily.   SYSTANE BALANCE 0.6 % Soln Generic drug:  Propylene Glycol Apply 1 drop to eye 2 (two) times daily.   SYSTANE NIGHTTIME Oint Place 1 application into both eyes at bedtime as needed (irritation).   GENTEAL TEARS NIGHT-TIME Oint Apply 1 application to eye at bedtime.   VISION FORMULA/LUTEIN Tabs Take 1 tablet by mouth daily.   Vitamin D3 50 MCG (2000 UT) Tabs Take 1 tablet by mouth daily.            Durable Medical Equipment  (From admission, onward)         Start     Ordered   09/17/18 0955  DME Oxygen  Once    Question Answer Comment  Mode or (Route) Mask   Liters per Minute 2.5   Frequency Continuous (stationary and portable oxygen unit needed)   Oxygen conserving device Yes   Oxygen delivery system Gas      09/17/18 0955         Follow-up Information    Mellody Dance, DO. Schedule an appointment as soon as  possible for a visit in 1 week(s).   Specialty:  Family Medicine Contact information: [address] [place] [address] [phone]          Allergies  Allergen Reactions  . Ace Inhibitors Cough  . Benadryl [Diphenhydramine Hcl (Sleep)] Other (See Comments)    hypotension  . Penicillins Hives and Swelling    Swelling of arms Has patient had a PCN reaction causing immediate rash, facial/tongue/throat swelling, SOB or lightheadedness with hypotension: unknown Has patient had a PCN reaction causing severe rash involving mucus membranes or skin necrosis: no Has patient had a PCN reaction that required hospitalization: unknown Has patient had a PCN reaction occurring within the last 10 years: no, childhood allergy If all of the above answers are "NO", then may proceed with Cephalosporin use.   . Sulfonamide Derivatives Swelling    Consultations:  None   Procedures/Studies: Dg Chest 2 View  Result Date: 09/15/2018 CLINICAL DATA:  85 y/o  F; central chest pain and tightness. EXAM: CHEST - 2 VIEW COMPARISON:  01/13/2016 chest radiograph.  10/13/2012 CT chest. FINDINGS: Stable mildly enlarged cardiac silhouette given projection and technique. Aortic atherosclerosis with calcification. Pulmonary venous hypertension. Bronchitic changes in the lung bases. No focal consolidation. No pleural effusion or pneumothorax. No acute osseous abnormality is evident. IMPRESSION: Pulmonary venous hypertension. Bronchitic changes in the lung bases. No focal consolidation. Electronically Signed   By: Kristine Garbe M.D.   On: 09/15/2018 15:01      Subjective: Patient seen and examined at bedside.  She feels slightly better.  She feels that she is ready to be discharged.  No worsening shortness of breath.  No fever, nausea or vomiting.  Discharge Exam: Vitals:   09/16/18 2042 09/17/18 0550  BP: (!) 104/93 (!) 115/57  Pulse: (!) 41 79  Resp: 18 18  Temp:  97.8 F (36.6 C)   SpO2: 100% 100%   Vitals:   09/16/18 0609 09/16/18 1425 09/16/18 2042 09/17/18 0550  BP: (!) 108/47 (!) 126/50 (!) 104/93 (!) 115/57  Pulse: 78 72 (!) 41 79  Resp: 18 (!) 24 18 18   Temp: 98.3 F (36.8 C) 98.1 F (36.7 C) 97.7 F (  36.5 C) 97.8 F (36.6 C)  TempSrc: Oral Oral  Oral  SpO2: 100% 100% 100% 100%  Weight:      Height:        General: Pt is alert, awake, not in acute distress Cardiovascular: rate controlled, S1/S2 + Respiratory: bilateral decreased breath sounds at bases, very minimal wheezing Abdominal: Soft, NT, ND, bowel sounds + Extremities: Trace edema, no cyanosis    The results of significant diagnostics from this hospitalization (including imaging, microbiology, ancillary and laboratory) are listed below for reference.     Microbiology: Recent Results (from the past 240 hour(s))  Respiratory Panel by PCR     Status: None   Collection Time: 09/15/18 10:51 PM  Result Value Ref Range Status   Adenovirus NOT DETECTED NOT DETECTED Final   Coronavirus 229E NOT DETECTED NOT DETECTED Final   Coronavirus HKU1 NOT DETECTED NOT DETECTED Final   Coronavirus NL63 NOT DETECTED NOT DETECTED Final   Coronavirus OC43 NOT DETECTED NOT DETECTED Final   Metapneumovirus NOT DETECTED NOT DETECTED Final   Rhinovirus / Enterovirus NOT DETECTED NOT DETECTED Final   Influenza A NOT DETECTED NOT DETECTED Final   Influenza B NOT DETECTED NOT DETECTED Final   Parainfluenza Virus 1 NOT DETECTED NOT DETECTED Final   Parainfluenza Virus 2 NOT DETECTED NOT DETECTED Final   Parainfluenza Virus 3 NOT DETECTED NOT DETECTED Final   Parainfluenza Virus 4 NOT DETECTED NOT DETECTED Final   Respiratory Syncytial Virus NOT DETECTED NOT DETECTED Final   Bordetella pertussis NOT DETECTED NOT DETECTED Final   Chlamydophila pneumoniae NOT DETECTED NOT DETECTED Final   Mycoplasma pneumoniae NOT DETECTED NOT DETECTED Final    Comment: Performed at [facility], [address],  [place], [place] [address]  Culture, blood (routine x 2) Call MD if unable to obtain prior to antibiotics being given     Status: None (Preliminary result)   Collection Time: 09/16/18  1:39 AM  Result Value Ref Range Status   Specimen Description BLOOD RIGHT ANTECUBITAL  Final   Special Requests   Final    BOTTLES DRAWN AEROBIC AND ANAEROBIC Blood Culture results may not be optimal due to an inadequate volume of blood received in culture bottles   Culture   Final    NO GROWTH 1 DAY Performed at [facility] [address], [place], [place] [address]    Report Status PENDING  Incomplete  Culture, blood (routine x 2) Call MD if unable to obtain prior to antibiotics being given     Status: None (Preliminary result)   Collection Time: 09/16/18  1:43 AM  Result Value Ref Range Status   Specimen Description BLOOD LEFT HAND  Final   Special Requests   Final    BOTTLES DRAWN AEROBIC AND ANAEROBIC Blood Culture results may not be optimal due to an inadequate volume of blood received in culture bottles   Culture   Final    NO GROWTH 1 DAY Performed at [facility], [place] [address], [place], [place] [address]    Report Status PENDING  Incomplete  Culture, sputum-assessment     Status: None   Collection Time: 09/16/18  5:00 PM  Result Value Ref Range Status   Specimen Description EXPECTORATED SPUTUM  Final   Special Requests NONE  Final   Sputum evaluation   Final    THIS SPECIMEN IS ACCEPTABLE FOR SPUTUM CULTURE Performed at [facility], [place] [address] [place], [place] [address]  Report Status 09/16/2018 FINAL  Final  Culture, respiratory     Status: None (Preliminary result)   Collection Time: 09/16/18  5:00 PM  Result Value Ref Range Status   Specimen Description EXPECTORATED SPUTUM  Final   Special Requests NONE Reflexed from [id number]  Final   Gram Stain   Final    NO WBC SEEN RARE SQUAMOUS EPITHELIAL CELLS PRESENT RARE GRAM POSITIVE COCCI RARE GRAM POSITIVE RODS     Culture   Final    TOO YOUNG TO READ Performed at [facility], [address], [place], [place] [address]    Report Status PENDING  Incomplete     Labs: BNP (last 3 results) Recent Labs    09/15/18 1950  BNP 481.8*   Basic Metabolic Panel: Recent Labs  Lab 09/15/18 1417 09/16/18 0201  NA 136 136  K 4.6 4.3  CL 105 106  CO2 22 22  GLUCOSE 186* 126*  BUN 8 9  CREATININE 0.86 0.90  CALCIUM 9.0 8.7*   Liver Function Tests: No results for input(s): AST, ALT, ALKPHOS, BILITOT, PROT, ALBUMIN in the last 168 hours. No results for input(s): LIPASE, AMYLASE in the last 168 hours. No results for input(s): AMMONIA in the last 168 hours. CBC: Recent Labs  Lab 09/15/18 1417 09/15/18 1928 09/16/18 0201  WBC 5.7  --  3.0*  NEUTROABS  --  4.4  --   HGB 10.6*  --  9.9*  HCT 35.6*  --  33.4*  MCV 83.8  --  83.3  PLT PLATELET CLUMPS NOTED ON SMEAR, COUNT APPEARS DECREASED  --  53*   Cardiac Enzymes: No results for input(s): CKTOTAL, CKMB, CKMBINDEX, TROPONINI in the last 168 hours. BNP: Invalid input(s): POCBNP CBG: Recent Labs  Lab 09/16/18 0815 09/16/18 1158 09/16/18 1716 09/16/18 2123 09/17/18 0824  GLUCAP 273* 357* 342* 280* 145*   D-Dimer No results for input(s): DDIMER in the last 72 hours. Hgb A1c No results for input(s): HGBA1C in the last 72 hours. Lipid Profile No results for input(s): CHOL, HDL, LDLCALC, TRIG, CHOLHDL, LDLDIRECT in the last 72 hours. Thyroid function studies No results for input(s): TSH, T4TOTAL, T3FREE, THYROIDAB in the last 72 hours.  Invalid input(s): FREET3 Anemia work up No results for input(s): VITAMINB12, FOLATE, FERRITIN, TIBC, IRON, RETICCTPCT in the last 72 hours. Urinalysis    Component Value Date/Time   COLORURINE YELLOW 01/13/2016 2026   APPEARANCEUR CLEAR 01/13/2016 2026   LABSPEC 1.009 01/13/2016 2026   PHURINE 6.5 01/13/2016 2026   GLUCOSEU 250 (A) 01/13/2016 2026   HGBUR NEGATIVE 01/13/2016 2026    BILIRUBINUR NEGATIVE 01/13/2016 2026   KETONESUR NEGATIVE 01/13/2016 2026   PROTEINUR NEGATIVE 01/13/2016 2026   UROBILINOGEN 0.2 12/12/2012 0920   NITRITE NEGATIVE 01/13/2016 2026   LEUKOCYTESUR NEGATIVE 01/13/2016 2026   Sepsis Labs Invalid input(s): PROCALCITONIN,  WBC,  LACTICIDVEN Microbiology Recent Results (from the past 240 hour(s))  Respiratory Panel by PCR     Status: None   Collection Time: 09/15/18 10:51 PM  Result Value Ref Range Status   Adenovirus NOT DETECTED NOT DETECTED Final   Coronavirus 229E NOT DETECTED NOT DETECTED Final   Coronavirus HKU1 NOT DETECTED NOT DETECTED Final   Coronavirus NL63 NOT DETECTED NOT DETECTED Final   Coronavirus OC43 NOT DETECTED NOT DETECTED Final   Metapneumovirus NOT DETECTED NOT DETECTED Final   Rhinovirus / Enterovirus NOT DETECTED NOT DETECTED Final   Influenza A NOT DETECTED NOT DETECTED Final   Influenza B  NOT DETECTED NOT DETECTED Final   Parainfluenza Virus 1 NOT DETECTED NOT DETECTED Final   Parainfluenza Virus 2 NOT DETECTED NOT DETECTED Final   Parainfluenza Virus 3 NOT DETECTED NOT DETECTED Final   Parainfluenza Virus 4 NOT DETECTED NOT DETECTED Final   Respiratory Syncytial Virus NOT DETECTED NOT DETECTED Final   Bordetella pertussis NOT DETECTED NOT DETECTED Final   Chlamydophila pneumoniae NOT DETECTED NOT DETECTED Final   Mycoplasma pneumoniae NOT DETECTED NOT DETECTED Final    Comment: Performed at [facility], [place] [address], [place], [place] [address]  Culture, blood (routine x 2) Call MD if unable to obtain prior to antibiotics being given     Status: None (Preliminary result)   Collection Time: 09/16/18  1:39 AM  Result Value Ref Range Status   Specimen Description BLOOD RIGHT ANTECUBITAL  Final   Special Requests   Final    BOTTLES DRAWN AEROBIC AND ANAEROBIC Blood Culture results may not be optimal due to an inadequate volume of blood received in culture bottles   Culture   Final    NO GROWTH 1  DAY Performed at [facility] [address] [place], [place] [address]    Report Status PENDING  Incomplete  Culture, blood (routine x 2) Call MD if unable to obtain prior to antibiotics being given     Status: None (Preliminary result)   Collection Time: 09/16/18  1:43 AM  Result Value Ref Range Status   Specimen Description BLOOD LEFT HAND  Final   Special Requests   Final    BOTTLES DRAWN AEROBIC AND ANAEROBIC Blood Culture results may not be optimal due to an inadequate volume of blood received in culture bottles   Culture   Final    NO GROWTH 1 DAY Performed at [facility], [place] [address], [place], [place] [address]    Report Status PENDING  Incomplete  Culture, sputum-assessment     Status: None   Collection Time: 09/16/18  5:00 PM  Result Value Ref Range Status   Specimen Description EXPECTORATED SPUTUM  Final   Special Requests NONE  Final   Sputum evaluation   Final    THIS SPECIMEN IS ACCEPTABLE FOR SPUTUM CULTURE Performed at [facility], [place] [address] [place], [place] [address]    Report Status 09/16/2018 FINAL  Final  Culture, respiratory     Status: None (Preliminary result)   Collection Time: 09/16/18  5:00 PM  Result Value Ref Range Status   Specimen Description EXPECTORATED SPUTUM  Final   Special Requests NONE Reflexed from [id number]  Final   Gram Stain   Final    NO WBC SEEN RARE SQUAMOUS EPITHELIAL CELLS PRESENT RARE GRAM POSITIVE COCCI RARE GRAM POSITIVE RODS    Culture   Final    TOO YOUNG TO READ Performed at [facility], [place] [address] [place], [name] [address]    Report Status PENDING  Incomplete     Time coordinating discharge: 35 minutes  SIGNED:   Aline August, MD  [facility] 09/17/2018, 9:56 AM Pager: [phone]  If 7PM-7AM, please contact night-coverage [email or web address] Password [email or web address]

## 2018-09-17 NOTE — Progress Notes (Signed)
SATURATION QUALIFICATIONS: (This note is used to comply with regulatory documentation for home oxygen)  Patient Saturations on Room Air at Rest = 88%  Patient Saturations on Room Air while Ambulating = 88%  Patient Saturations on 2 Liters of oxygen while Ambulating = 97%  Please briefly explain why patient needs home oxygen: unable to maintain a normal O2 SAT during ambulation and/or rest.

## 2018-09-17 NOTE — Progress Notes (Signed)
Patient ambulated with rolling walker today. At beside O2 SAT 95-98% on 2 liters via nasal canula. During ambulation and at rest O2 SAT 88-90% without O2. With oxygen at 2 liters via nasal canula O2 SAT 97-98%. At rest and during ambulation  patient reports no dyspnea and/or SOB.

## 2018-09-17 NOTE — Progress Notes (Signed)
Pt's IV to left forearm infiltrated while receiving Zithromax IV. Pt's left arm is swollen and pt states it is painful. Applied heating packs and elevated on pillow. Pt states it feels some better. Pt states she wants to wait till tomorrow to see if she goes home before getting another IV started since she will not get anymore IV antibiotics till tomorrow. Will continue to monitor pt. Nelda Marseille, RN

## 2018-09-19 DIAGNOSIS — J449 Chronic obstructive pulmonary disease, unspecified: Secondary | ICD-10-CM | POA: Diagnosis not present

## 2018-09-19 LAB — CULTURE, RESPIRATORY W GRAM STAIN
Culture: NORMAL
Gram Stain: NONE SEEN

## 2018-09-19 NOTE — Consult Note (Signed)
            [facility] CM Primary Care Navigator  09/19/2018  [name] 03/11/1934 [id number]   Went to seepatient at the bedside to identify possible discharge needs butshe wasalreadydischarged home per staff.   Per MD note,patientpresented with worsening shortness of breath still requiring oxygen during daytime and will be discharged home on oral prednisone and Zithromax. (Pneumonia, Probable COPD exacerbation, chronic hypoxic respiratory failure, diabetes mellitus type 2 uncontrolled with hyperglycemia)  Patient has discharge instruction to follow-up withher primary care provider in 1 week.   For additional questions please contact:  Karin Golden A. Rim Thatch, BSN, RN-BC [facility] PRIMARY CARE Navigator Cell: [phone]

## 2018-09-21 LAB — CULTURE, BLOOD (ROUTINE X 2)
Culture: NO GROWTH
Culture: NO GROWTH

## 2018-09-23 ENCOUNTER — Other Ambulatory Visit: Payer: Self-pay

## 2018-09-23 NOTE — Patient Outreach (Signed)
[facility]) Care Management  [facility] CM Pharmacy  09/23/2018  [name] 1933-10-03 [id number]  Reason for call: post discharge medication review  Unsuccessful telephone call attempt # 1 to patient.   HIPAA compliant voicemail left requesting a return call.  Plan:  I will make another outreach attempt to patient within 3-4 business days.  Berlin Hun, PharmD Clinical Pharmacist [facility] [phone]

## 2018-09-25 ENCOUNTER — Ambulatory Visit: Payer: PPO

## 2018-09-25 ENCOUNTER — Encounter: Payer: Self-pay | Admitting: Family Medicine

## 2018-09-25 ENCOUNTER — Ambulatory Visit (INDEPENDENT_AMBULATORY_CARE_PROVIDER_SITE_OTHER): Payer: PPO | Admitting: Family Medicine

## 2018-09-25 VITALS — BP 122/71 | HR 67 | Temp 98.5°F | Ht 60.0 in | Wt 210.0 lb

## 2018-09-25 DIAGNOSIS — R601 Generalized edema: Secondary | ICD-10-CM | POA: Diagnosis not present

## 2018-09-25 DIAGNOSIS — I2583 Coronary atherosclerosis due to lipid rich plaque: Secondary | ICD-10-CM

## 2018-09-25 DIAGNOSIS — D61818 Other pancytopenia: Secondary | ICD-10-CM

## 2018-09-25 DIAGNOSIS — J441 Chronic obstructive pulmonary disease with (acute) exacerbation: Secondary | ICD-10-CM | POA: Diagnosis not present

## 2018-09-25 DIAGNOSIS — R635 Abnormal weight gain: Secondary | ICD-10-CM

## 2018-09-25 DIAGNOSIS — Z09 Encounter for follow-up examination after completed treatment for conditions other than malignant neoplasm: Secondary | ICD-10-CM

## 2018-09-25 DIAGNOSIS — Z794 Long term (current) use of insulin: Secondary | ICD-10-CM

## 2018-09-25 DIAGNOSIS — I2510 Atherosclerotic heart disease of native coronary artery without angina pectoris: Secondary | ICD-10-CM | POA: Diagnosis not present

## 2018-09-25 DIAGNOSIS — J42 Unspecified chronic bronchitis: Secondary | ICD-10-CM

## 2018-09-25 DIAGNOSIS — IMO0001 Reserved for inherently not codable concepts without codable children: Secondary | ICD-10-CM

## 2018-09-25 DIAGNOSIS — D696 Thrombocytopenia, unspecified: Secondary | ICD-10-CM

## 2018-09-25 DIAGNOSIS — I5032 Chronic diastolic (congestive) heart failure: Secondary | ICD-10-CM

## 2018-09-25 DIAGNOSIS — R0602 Shortness of breath: Secondary | ICD-10-CM

## 2018-09-25 DIAGNOSIS — E118 Type 2 diabetes mellitus with unspecified complications: Secondary | ICD-10-CM

## 2018-09-25 NOTE — Patient Instructions (Signed)
Patient will follow-up with her cardiologist tomorrow for the 30+ pound weight gain that she has had recently.    -Please also follow-up with patient's pulmonologist Dr. Marchelle Gearing and call for appointment.  Let them know she was recently discharged from the hospital for acute respiratory failure, acute on chronic COPD exacerbation and now requiring oxygen via nasal cannula 24/7.  Also, please make a follow-up with Dr. Romero Belling patient's endocrinologist who prescribes and manages her insulin.   This is extremely important.  Please follow the recommendations we discussed in regards to insulin management until you speak with them.      Lantus dose -hold for now since patient's blood sugars are running low in the 70s with symptoms.  Use sliding scale below for coverage with meals until you speak with Dr. Romero Belling.   Then you will follow all her recommendations regarding insulin management and management of patient's diabetes.   Please add Humalog: - 10 units before a smaller meal - 15 units before a larger meal  Add the following Sliding Scale: - 150-175: + 1 unit  - 176-200: + 2 units  - 201-225: + 3 units  - 226-250: + 4 units  - 251-275: + 5 units - 276-300: + 6 units  Please return in 1.5 months with your sugar log.    Basic Rules for Patients with Type I Diabetes Mellitus  1. The American Diabetes Association (ADA) recommended targets: - fasting sugar <130 - after meal sugar <180 - HbA1C <7%  2. Engage in ?150 min moderate exercise per week  3. Make sure you have ?8h of sleep every night as this helps both blood sugars and your weight.  4. Always keep a sugar log (not only record in your meter) and bring it to all appointments with [place].  5. "15-15 rule" for hypoglycemia: if sugars are low, take 15 g of carbs** ("fast sugar" - e.g. 4 glucose tablets, 4 oz orange juice), wait 15 min, then check sugars again. If still <80, repeat. Continue  until your sugars >80, then eat a  normal meal.   6. Teach family members and coworkers to inject glucagon. Have a glucagon set at home and one at work. They should call 911 after using the set.  7. Check sugar before driving. If <100, correct, and only start driving if sugars rise ?563. Check sugar every hour when on a long drive.  8. Check sugar before exercising. If <100, correct, and only start exercising if sugars rise ?100. Check sugar every hour when on a long exercise routine and 1h after you finished exercising.   If >250, check urine for ketones. If you have moderate-large ketones in urine, do not start exercise. Hydrate yourself with clear liquids and correct the high sugar. Recheck sugars and ketones before attempting to exercise.  Be aware that you might need less insulin when exercising.  *intense, short, exercise bursts can increase your sugars, but  *less intense, longer (>1h), exercise routines can decrease your sugars.   9. Make sure you have a MedAlert bracelet or pendant mentioning "Type I Diabetes Mellitus". If you have a prior episode of severe hypoglycemia or hypoglycemia unawareness, it should also mention this.  10. Please do not walk barefoot. Inspect your feet for sores/cuts and let us know if you have them.  11. Please call [facility] with any questions and concerns [phone]).   **E.g. of "fast carbs":  first choice (15 g):  1 tube glucose gel, GlucoPouch 15, 2 oz  glucose liquid  second choice (15-16 g):  3 or 4 glucose tablets (best taken      with water), 15 Dextrose Bits chewable  third choice (15-20 g):   cup fruit juice,  cup regular soda, 1 cup skim milk,  1 cup sports drink  fourth choice (15-20 g):  1 small tube [place] gel (not frosting), 2 tbsp raisins, 1 tbsp table sugar,  candy, jelly beans, gum drops - check package for carb amount   (adapted from: Juluis Rainier. "Insulin therapy and hypoglycemia" Endocrinol Metab Clin N Am 2012, 41:  57-87)

## 2018-09-25 NOTE — Progress Notes (Signed)
Impression and Recommendations:    1. Hospital discharge follow-up   2. Anasarca   3. Weight gain, abnormal   4. Heart failure, diastolic, chronic ([place])   5. SOB (shortness of breath)   6. COPD with acute exacerbation ([place])   7. Insulin dependent diabetes mellitus with complications (HCC)-managed by Dr. Elayne Snare.  Very tenuous at current   47. Pancytopenia ([place])   9. Morbidly obese ([place])- BMI >er 35 w DM   10. Coronary artery disease due to lipid rich plaque   11. Chronic bronchitis, unspecified chronic bronchitis type ([place])   12. Thrombocytopenia, unspecified ([place]) Chronic    -Regarding pt's recent hospitalization and/or ED visit: reviewed in great detail recent hospitalization notes, clinical lab tests, tests in the radiology section of CPT, tests in the medicine testing of CPT, and obtained history from family member. -Independent visualization of image, tracing, or specimen itself done by me today.   In patient's current state: -Without further workup and treatment planned, risk to pt's morbidity is high with possible prolonged functional impairment and poor health outcomes.    Hospital discharge follow-up  - Plan: DG Chest 2 View, CBC with Differential/Platelet, Comprehensive metabolic panel, Magnesium, Phosphorus, Brain natriuretic peptide - plan to call cards for appt asap - pt and pt's daughter told to call for close f/up with Endo and Pulm  - Hematology referral    NEW ONSET Anasarca &  Weight gain, abnormal - Chest x-ray obtained today.  Which by my interpretation shows increased pulmonary markings and cephalization.  Increased cardiomegaly from prior. No definitive pleural effusions - Suspect cardiac insult despite no orthopnea or other signs.  However very doubtful it is hepatic or nephrotic in etiology due to very stable labs - Since we were able to get patient into see her cardiologist tomorrow, in less than 24 hours, we will not begin diuresis and  increase her Lasix until she is seen, evaluated by them.  Management of anasarca by cards if deemed appropriate. - Most recent echocardiogram 4 / 2014: EF 50 to 55% with severe hypokinesis of entire inferior myocardium.  Suspect cards will obtain this and additional w-up on pt tomorrow.  - will cont to monitor and follow - Plan: CXR;  CBC with Differential/Platelet, Comprehensive metabolic panel, Magnesium, Phosphorus, Brain natriuretic peptide   Heart failure, diastolic, chronic (HCC)  - Plan: CBC with Differential/Platelet, Comprehensive metabolic panel, Magnesium, Phosphorus, Brain natriuretic peptide  SOB (shortness of breath)  - Plan: DG Chest 2 View, -labs - Brain natriuretic peptide  COPD with acute exacerbation (HCC) & Chronic bronchitis, unspecified chronic bronchitis type (HCC)  - breathing stable w O2, recently inc to [employer]. -CXR today - Plan:  Make f/up appt with your Pulmonologist near future.  Tell them you were recently d/ced from hosp for this condition and changed on O2 - Cont current O2 and meds  - pt already completed course of steroids and abx   Insulin dependent diabetes mellitus with complications (HCC) -  Brittle BS's at current  - Recent A1c stable - Reviewed with daughter as well as patient insulin sliding scale. -I advised since intake is still poor, she forego her Lantus and just cover for meals eaten. - Recommend she check blood sugars pre-and postprandial as well as after any prolonged fast. -Handouts provided regarding's insulin sliding scale dosing after discussed. -   Since her insulin dep DM is managed by Dr. Ivette Loyal will call for an appointment and  explained this is hospital follow-up so she could be seen in the very near future.   Further advice on insulin dosing per her endocrinologist     - Plan: CBC with Differential/Platelet, Comprehensive metabolic panel, Magnesium, Phosphorus, Brain natriuretic peptide   Morbidly obese  (HCC) - BMI >er 35 w DM - Plan: CBC with Differential/Platelet, Comprehensive metabolic panel, Magnesium, Phosphorus, Brain natriuretic peptide  Coronary artery disease due to lipid rich plaque  - Plan: CBC with Differential/Platelet, Comprehensive metabolic panel, Magnesium, Phosphorus, Brain natriuretic peptide  Thrombocytopenia, unspecified (HCC) &  Chronic / Pancytopenia ([place])  - Plan: Ambulatory referral to Hematology- has never had this evaluated in the past per family member  - Pt cbc appeared stable upon d/c from hosp. - obtain labs today; future ones per Heme Doc   Pt was interviewed, examined and evaluated by me in the clinic today for over 1 hour and 20 minutes, with over 50% time spent in face to face counseling of patients various medical conditions, treatment plans of those medical conditions including medicine management and lifestyle modification, strategies to improve health and well being; and in coordination of care. SEE ABOVE TREATMENT PLAN FOR DETAILS   Orders Placed This Encounter  Procedures  . DG Chest 2 View  . CBC with Differential/Platelet  . Comprehensive metabolic panel  . Magnesium  . Phosphorus  . Brain natriuretic peptide  . Specimen status report  . Ambulatory referral to Hematology    -Patient's daughter who accompanied her to her office visit today asked if I could call another 1 of the patient's daughters to discuss the entire plan of care with her as well.  I explained that I already spent almost an hour and a half with them today and since she was here with patient, I would appreciate her relaying the message.   I advised that all future appointments that patient has, should be accompanied by 1 of her children so they can relay the message to the rest of the family.   Apparently another daughter is an Therapist, sports and another son is a Therapist, sports as well -thus I recommended 1 of them be at all of patient's office visits in the future.   Expresses verbal understanding  and consents to current therapy and treatment regimen.  No barriers to understanding were identified.  Red flag symptoms and signs discussed in detail.  Patient expressed understanding regarding what to do in case of emergency\urgent symptoms  Please see AVS handed out to patient at the end of our visit for further patient instructions/ counseling done pertaining to today's office visit.   Return if symptoms worsen or fail to improve, for Follow-up closely and around 2 to 3 months after seen by Cards, Endo, pulm.     Note:  This note was prepared with assistance of Dragon voice recognition software. Occasional wrong-word or sound-a-like substitutions may have occurred due to the inherent limitations of voice recognition software.      --------------------------------------------------------------------------------------------------------------------------------------------------------------------------------------------------------------------------------------------    Subjective:     HPI: [name] is a 85 y.o. female who presents to [facility] today for issues as discussed below.  Patient is an 85 year old female with multiple medical critical problems to include but not exclusive to diastolic congestive heart failure, coronary artery disease, insulin dependant DM, COPD with chronic resp failure previously on prn home oxygen, and morbid obesity who presents to clinic today for routine hospital follow-up.    She was admitted 1/ 27  and discharged 09/17/2018 for worsening shortness of breath, and per the family, who is in with her in the office today for the first time, patient also had a relatively acute 50 to 60 pound weight gain as well.  (There is nothing documented about this in the admission or discharge note.)  Patient was last seen here 08/26/2018 with a weight of 181 and today she is 210 pounds.  The family also states she has lost about 20 pounds just  since her recent hospital discharge.    In the hospital, she was diagnosed with acute exac of COPD she was started empirically on antibiotics and a chest x-ray was obtained-  negative for infiltrates.  She was then diag with "PNA" as well.   She was given steroids in the hospital as well discharged on prednisone taper as well as Z-Pak.   Blood cultures as well as sputum cultures were negative.   BNP was obtained and was found to be 394.1  Patient is here and has slowly improved in terms of her acute resp failure- now on around the clock O2.  However, in terms of her fatigue, SOB with activity as well as HER SIGNIFICANT WT GAIN, and her insulin dep DM- she is still doing poorly.   Diabetes: Managed by endocrine- [name].  Patient was not told to follow-up with them for management of her insulin.  She has not been eating well and hence had low sugars in the 70s with symptoms.  Her last A1c was obtained recently with A1c of 9.0.  Patient is managed on Lantus as well as NovoLog.  She is just starting to eat a more normal diet however she has confusion as to what to do with her sugars.  Congestive heart failure: Managed by cardiology- Dr Acie Fredrickson and team.  Patient was not told to follow-up with them upon discharge from the hospital.  There is no mention of patient's 60 pound weight gain upon admission and patient remains 30 pounds up from when she was last seen here early January 2020.    She has increasing shortness of breath with any exertion- MUCH MORE THAN her usual however does not seem to have an orthopneic component.  Lower extremity is more swollen than usual but she is still sleeping on 2 pillows at night.    She remains much more SOB with ambulation than usual despite being on constant oxygen now.   She is managed with Lasix 20 mg daily but was not told to make any adjustments upon discharge from the hospital but however, was given steroids.  Acute on chronic COPD exacerbation/chronic hypoxic  respiratory failure: Patient is managed by Dr. Chase Caller of pulmonology.   Again, patient was not told upon discharge to follow-up with her pulmonologist for the presumed pneumonia, acute respiratory failure and acute on chronic COPD exacerbation, as well as the recommendations to increase home O2 to around-the-clock and inc dose.   Although CT was not obtained, chest x-ray was negative and sputum/ blood cultures negative.  She was discharged on prednisone as well as a Z-Pak.    She had previously been on 2 L of oxygen during the day PRN only but now requiring constant day and night supplementation.   --> Patient complains today of worsening shortness of breath and fatigue than usual.  The family is with her, which is the first time I have met them, and they are concerned about patient's 50 to 60 pound weight gain that she had prior  to going into the hospital and currently now still is 30 pounds up.   Per the family, they did not address this during the hospitalization and Lasix dose was not changed.     Additionally, patient has had decreased p.o. intake of food and drink since her hospitalization and her sugars hence have been brittle vs her usual state.   Patient has been eating and drinking okay and is slowly increasing amnt.    She does not have any CP/ chest tightness, pleuritic pain, orthopnea or paroxysmal nocturnal dyspnea at current.  She has minimal increased swelling of her lower extremities which is more than usual but not significant.   --> She also has significant increase in abdominal girth ( from when I last saw her early Jan) ever since just prior to being admitted to the hospital.   -The family has multiple concerns about these multiple issues today and, per the hospital discharge, there was nothing that needed to be followed up in the outpatient realm for this patient.    Wt Readings from Last 3 Encounters:  09/25/18 210 lb (95.3 kg)  09/15/18 177 lb (80.3 kg)  08/26/18 181 lb  (82.1 kg)   BP Readings from Last 3 Encounters:  09/25/18 122/71  09/17/18 (!) 115/57  08/26/18 126/85   Pulse Readings from Last 3 Encounters:  09/25/18 67  09/17/18 79  08/26/18 66   BMI Readings from Last 3 Encounters:  09/25/18 41.01 kg/m  09/15/18 34.57 kg/m  08/26/18 35.35 kg/m     Patient Care Team    Relationship Specialty Notifications Start End  Mellody Dance, DO PCP - General Family Medicine  03/12/16   Brand Males, MD Attending Physician Pulmonary Disease Abnormal results only, Admissions 12/07/12   Nahser, Wonda Cheng, MD Consulting Physician Cardiology  05/01/16   Jackquline Denmark, MD Referring Physician Gastroenterology  05/01/16   Jacelyn Pi, MD Consulting Physician Endocrinology  05/22/17   Dionne Milo, [facility] Pharmacist Pharmacist  09/23/18      Patient Active Problem List   Diagnosis Date Noted  . Hypertension associated with diabetes ([place]) 05/22/2017    Priority: High  . Morbidly obese ([place])- BMI >er 35 w DM 06/23/2016    Priority: High  . Insulin dependent diabetes mellitus with complications ([place]) 54/27/0623    Priority: High  . Mixed diabetic hyperlipidemia associated with type 1 diabetes mellitus ([place]) 03/13/2016    Priority: High  . Depression 03/13/2016    Priority: Medium  . Hypothyroidism 03/13/2016    Priority: Medium  . Thrombocytopenia, unspecified ([place]) 12/13/2012    Priority: Medium  . Coronary artery disease 04/11/2011    Priority: Medium  . COPD (chronic obstructive pulmonary disease) ([place]) 04/11/2011    Priority: Medium  . Obstructive sleep apnea 04/11/2011    Priority: Medium  . Heart failure, diastolic, chronic ([place]) 76/28/3151    Priority: Medium  . Vitamin D deficiency 05/11/2016    Priority: Low  . Atherosclerosis of carotid arteries 04/03/2006    Priority: Low  . PNA (pneumonia) 09/15/2018  . Microalbuminuria due to type 2 diabetes mellitus ([place]) 08/26/2018  . Hypocalcemia 03/12/2018  . Gastroesophageal  reflux disease 12/31/2017  . chronic Low platelet count 06/05/2017  . chronic Leukopenia 05/11/2016  . Elevated serum alkaline phosphatase level- hepatocellular in origin 05/11/2016  . Constipation, chronic 03/13/2016  . At high risk for osteoporosis 03/13/2016  . Hypotension 12/13/2012  . Hypokalemia 12/13/2012  . h/o Clostridium difficile colitis 12/12/2012  . History of GI  bleed 12/12/2012  . ILD (interstitial lung disease) ([place]) 11/25/2012    Past Medical history, Surgical history, Family history, Social history, Allergies and Medications have been entered into the medical record, reviewed and changed as needed.    Current Meds  Medication Sig  . albuterol (PROAIR HFA) 108 (90 Base) MCG/ACT inhaler Inhale 2 puffs into the lungs every 6 (six) hours as needed for wheezing or shortness of breath.  . Blood Glucose Monitoring Suppl (FREESTYLE LITE) DEVI Use to check blood sugars 2-3 times daily  . Carboxymeth-Glyc-Polysorb PF (REFRESH OPTIVE MEGA-3) 0.5-1-0.5 % SOLN Apply 1-2 drops to eye 2 (two) times daily.   . carvedilol (COREG) 3.125 MG tablet TAKE ONE TABLET BY MOUTH TWO TIMES DAILY WITH A MEAL (Patient taking differently: Take 3.125 mg by mouth 2 (two) times daily with a meal. )  . Cholecalciferol (VITAMIN D3) 50 MCG (2000 UT) TABS Take 1 tablet by mouth daily.  Marland Kitchen EASY TOUCH PEN NEEDLES 31G X 5 MM MISC USE TO INJECT INSULIN 5 TIMES A DAY  . escitalopram (LEXAPRO) 20 MG tablet TAKE 1 TABLET (20 MG TOTAL) BY MOUTH DAILY.  Marland Kitchen Fluticasone-Salmeterol (ADVAIR) 250-50 MCG/DOSE AEPB Inhale 1 puff into the lungs 2 (two) times daily.  . furosemide (LASIX) 40 MG tablet Take 0.5 tablets (20 mg total) by mouth daily.  . insulin aspart (NOVOLOG FLEXPEN) 100 UNIT/ML FlexPen Inject 30 Units into the skin 3 (three) times daily with meals.  . Insulin Glargine (LANTUS SOLOSTAR) 100 UNIT/ML Solostar Pen Inject 30-50 Units into the skin See admin instructions. Inject 30 units in the morning and 50 units  in the evening.  Marland Kitchen levothyroxine (SYNTHROID, LEVOTHROID) 100 MCG tablet Take 1 tablet (100 mcg total) by mouth daily.  . montelukast (SINGULAIR) 10 MG tablet TAKE 1 TABLET BY MOUTH EVERY EVENING. (Patient taking differently: Take 10 mg by mouth daily. )  . Multiple Vitamins-Minerals (VISION FORMULA/LUTEIN) TABS Take 1 tablet by mouth daily.  . nitroGLYCERIN (NITROSTAT) 0.4 MG SL tablet Place 1 tab under the tongue every 5 minutes as needed for chest pain. Please schedule appt for further refills. 3rd/final attempt.  . ONE TOUCH ULTRA TEST test strip TEST BLOOD SUGAR 2-3 TIMES A DAY  . ONETOUCH DELICA LANCETS 14N MISC USE TO CHECK BLOOD SUGAR THREE TIMES A DAY  . pantoprazole (PROTONIX) 20 MG tablet Take 1 tablet (20 mg total) by mouth 2 (two) times daily.  Vladimir Faster Glycol-Propyl Glycol (SYSTANE) 0.4-0.3 % SOLN Apply 1 drop to eye 4 (four) times daily.   . potassium chloride (K-DUR,KLOR-CON) 10 MEQ tablet Take 1 tablet (10 mEq total) by mouth 2 (two) times daily.  Marland Kitchen Propylene Glycol (SYSTANE BALANCE) 0.6 % SOLN Apply 1 drop to eye 2 (two) times daily.  . White Petrolatum-Mineral Oil (GENTEAL TEARS NIGHT-TIME) OINT Apply 1 application to eye at bedtime.  Dema Severin Petrolatum-Mineral Oil (SYSTANE NIGHTTIME) OINT Place 1 application into both eyes at bedtime as needed (irritation).     Allergies:  Allergies  Allergen Reactions  . Ace Inhibitors Cough  . Benadryl [Diphenhydramine Hcl (Sleep)] Other (See Comments)    hypotension  . Penicillins Hives and Swelling    Swelling of arms Has patient had a PCN reaction causing immediate rash, facial/tongue/throat swelling, SOB or lightheadedness with hypotension: unknown Has patient had a PCN reaction causing severe rash involving mucus membranes or skin necrosis: no Has patient had a PCN reaction that required hospitalization: unknown Has patient had a PCN reaction occurring within the last  10 years: no, childhood allergy If all of the above answers  are "NO", then may proceed with Cephalosporin use.   . Sulfonamide Derivatives Swelling     Review of Systems:  A fourteen system review of systems was performed and found to be positive as per HPI.   Objective:   Blood pressure 122/71, pulse 67, temperature 98.5 F (36.9 C), height 5' (1.524 m), weight 210 lb (95.3 kg), SpO2 96 %. Body mass index is 41.01 kg/m. General:  Well Developed, well nourished, appropriate for stated age.  Neuro:  Alert and oriented,  extra-ocular muscles intact  HEENT:  Normocephalic, atraumatic, neck supple, no carotid bruits appreciated  Skin:  no gross rash, warm, pink. Cardiac: Positive S1-S2, distant Respiratory:  ECTA B/L and A/P, however she has rails one third of the way up bilateral chest posteriorly., Not using accessory muscles, speaking in full sentences- unlabored. Abdomen: Patient is very swollen upon exam.Has pitting edema present Vascular:  Ext warm, no cyanosis apprec.; cap RF less 2 sec, she has pitting edema up to her knees bilaterally right lower extremity just slightly worse than left.Marland Kitchen Psych:  No HI/SI, judgement and insight good, Euthymic mood. Full Affect.

## 2018-09-26 ENCOUNTER — Encounter (HOSPITAL_COMMUNITY): Payer: Self-pay | Admitting: Emergency Medicine

## 2018-09-26 ENCOUNTER — Other Ambulatory Visit: Payer: Self-pay

## 2018-09-26 ENCOUNTER — Telehealth: Payer: Self-pay

## 2018-09-26 ENCOUNTER — Ambulatory Visit: Payer: Self-pay

## 2018-09-26 ENCOUNTER — Inpatient Hospital Stay (HOSPITAL_COMMUNITY)
Admission: EM | Admit: 2018-09-26 | Discharge: 2018-10-03 | DRG: 291 | Disposition: A | Discharge status: Home Health | Payer: PPO | Attending: Internal Medicine | Admitting: Internal Medicine

## 2018-09-26 ENCOUNTER — Emergency Department (HOSPITAL_COMMUNITY): Payer: PPO

## 2018-09-26 DIAGNOSIS — K7581 Nonalcoholic steatohepatitis (NASH): Secondary | ICD-10-CM | POA: Diagnosis not present

## 2018-09-26 DIAGNOSIS — E785 Hyperlipidemia, unspecified: Secondary | ICD-10-CM | POA: Diagnosis present

## 2018-09-26 DIAGNOSIS — J441 Chronic obstructive pulmonary disease with (acute) exacerbation: Secondary | ICD-10-CM | POA: Diagnosis not present

## 2018-09-26 DIAGNOSIS — I2583 Coronary atherosclerosis due to lipid rich plaque: Secondary | ICD-10-CM | POA: Diagnosis not present

## 2018-09-26 DIAGNOSIS — R188 Other ascites: Secondary | ICD-10-CM | POA: Diagnosis present

## 2018-09-26 DIAGNOSIS — R531 Weakness: Secondary | ICD-10-CM | POA: Diagnosis not present

## 2018-09-26 DIAGNOSIS — E039 Hypothyroidism, unspecified: Secondary | ICD-10-CM | POA: Diagnosis present

## 2018-09-26 DIAGNOSIS — G4733 Obstructive sleep apnea (adult) (pediatric): Secondary | ICD-10-CM | POA: Diagnosis present

## 2018-09-26 DIAGNOSIS — Z955 Presence of coronary angioplasty implant and graft: Secondary | ICD-10-CM | POA: Diagnosis not present

## 2018-09-26 DIAGNOSIS — K766 Portal hypertension: Secondary | ICD-10-CM | POA: Diagnosis not present

## 2018-09-26 DIAGNOSIS — Z794 Long term (current) use of insulin: Secondary | ICD-10-CM | POA: Diagnosis not present

## 2018-09-26 DIAGNOSIS — Z8249 Family history of ischemic heart disease and other diseases of the circulatory system: Secondary | ICD-10-CM | POA: Diagnosis not present

## 2018-09-26 DIAGNOSIS — J849 Interstitial pulmonary disease, unspecified: Secondary | ICD-10-CM | POA: Diagnosis not present

## 2018-09-26 DIAGNOSIS — K219 Gastro-esophageal reflux disease without esophagitis: Secondary | ICD-10-CM | POA: Diagnosis present

## 2018-09-26 DIAGNOSIS — I110 Hypertensive heart disease with heart failure: Secondary | ICD-10-CM | POA: Diagnosis not present

## 2018-09-26 DIAGNOSIS — E1165 Type 2 diabetes mellitus with hyperglycemia: Secondary | ICD-10-CM | POA: Diagnosis present

## 2018-09-26 DIAGNOSIS — I2510 Atherosclerotic heart disease of native coronary artery without angina pectoris: Secondary | ICD-10-CM | POA: Diagnosis not present

## 2018-09-26 DIAGNOSIS — Z79890 Hormone replacement therapy (postmenopausal): Secondary | ICD-10-CM

## 2018-09-26 DIAGNOSIS — K7460 Unspecified cirrhosis of liver: Secondary | ICD-10-CM | POA: Diagnosis not present

## 2018-09-26 DIAGNOSIS — F32A Depression, unspecified: Secondary | ICD-10-CM | POA: Diagnosis present

## 2018-09-26 DIAGNOSIS — D61818 Other pancytopenia: Secondary | ICD-10-CM | POA: Diagnosis present

## 2018-09-26 DIAGNOSIS — E118 Type 2 diabetes mellitus with unspecified complications: Secondary | ICD-10-CM

## 2018-09-26 DIAGNOSIS — F329 Major depressive disorder, single episode, unspecified: Secondary | ICD-10-CM | POA: Diagnosis present

## 2018-09-26 DIAGNOSIS — Z87891 Personal history of nicotine dependence: Secondary | ICD-10-CM

## 2018-09-26 DIAGNOSIS — R0602 Shortness of breath: Secondary | ICD-10-CM | POA: Diagnosis not present

## 2018-09-26 DIAGNOSIS — Z880 Allergy status to penicillin: Secondary | ICD-10-CM

## 2018-09-26 DIAGNOSIS — J9621 Acute and chronic respiratory failure with hypoxia: Secondary | ICD-10-CM | POA: Diagnosis not present

## 2018-09-26 DIAGNOSIS — Z6839 Body mass index (BMI) 39.0-39.9, adult: Secondary | ICD-10-CM

## 2018-09-26 DIAGNOSIS — Z882 Allergy status to sulfonamides status: Secondary | ICD-10-CM

## 2018-09-26 DIAGNOSIS — I5033 Acute on chronic diastolic (congestive) heart failure: Secondary | ICD-10-CM | POA: Diagnosis present

## 2018-09-26 DIAGNOSIS — Z79899 Other long term (current) drug therapy: Secondary | ICD-10-CM | POA: Diagnosis not present

## 2018-09-26 DIAGNOSIS — Z833 Family history of diabetes mellitus: Secondary | ICD-10-CM

## 2018-09-26 DIAGNOSIS — D696 Thrombocytopenia, unspecified: Secondary | ICD-10-CM | POA: Diagnosis present

## 2018-09-26 DIAGNOSIS — I252 Old myocardial infarction: Secondary | ICD-10-CM

## 2018-09-26 DIAGNOSIS — Z888 Allergy status to other drugs, medicaments and biological substances status: Secondary | ICD-10-CM

## 2018-09-26 LAB — CBC WITH DIFFERENTIAL/PLATELET
Abs Immature Granulocytes: 0.01 10*3/uL (ref 0.00–0.07)
Basophils Absolute: 0 10*3/uL (ref 0.0–0.1)
Basophils Relative: 1 %
Eosinophils Absolute: 0.1 10*3/uL (ref 0.0–0.5)
Eosinophils Relative: 3 %
HCT: 32.5 % — ABNORMAL LOW (ref 36.0–46.0)
Hemoglobin: 9.8 g/dL — ABNORMAL LOW (ref 12.0–15.0)
Immature Granulocytes: 0 %
Lymphocytes Relative: 12 %
Lymphs Abs: 0.4 10*3/uL — ABNORMAL LOW (ref 0.7–4.0)
MCH: 25.3 pg — ABNORMAL LOW (ref 26.0–34.0)
MCHC: 30.2 g/dL (ref 30.0–36.0)
MCV: 84 fL (ref 80.0–100.0)
Monocytes Absolute: 0.7 10*3/uL (ref 0.1–1.0)
Monocytes Relative: 18 %
Neutro Abs: 2.5 10*3/uL (ref 1.7–7.7)
Neutrophils Relative %: 66 %
Platelets: 50 10*3/uL — ABNORMAL LOW (ref 150–400)
RBC: 3.87 MIL/uL (ref 3.87–5.11)
RDW: 17.4 % — ABNORMAL HIGH (ref 11.5–15.5)
WBC: 3.7 10*3/uL — ABNORMAL LOW (ref 4.0–10.5)
nRBC: 0 % (ref 0.0–0.2)

## 2018-09-26 LAB — COMPREHENSIVE METABOLIC PANEL
ALT: 28 U/L (ref 0–44)
AST: 30 U/L (ref 15–41)
Albumin: 2.7 g/dL — ABNORMAL LOW (ref 3.5–5.0)
Alkaline Phosphatase: 116 U/L (ref 38–126)
Anion gap: 10 (ref 5–15)
BUN: 12 mg/dL (ref 8–23)
CO2: 27 mmol/L (ref 22–32)
Calcium: 8.5 mg/dL — ABNORMAL LOW (ref 8.9–10.3)
Chloride: 99 mmol/L (ref 98–111)
Creatinine, Ser: 0.84 mg/dL (ref 0.44–1.00)
GFR calc Af Amer: >60 mL/min (ref >60)
GFR calc non Af Amer: >60 mL/min (ref >60)
Glucose, Bld: 335 mg/dL — ABNORMAL HIGH (ref 70–99)
Potassium: 4.3 mmol/L (ref 3.5–5.1)
Sodium: 136 mmol/L (ref 135–145)
Total Bilirubin: 1 mg/dL (ref 0.3–1.2)
Total Protein: 6.1 g/dL — ABNORMAL LOW (ref 6.5–8.1)

## 2018-09-26 LAB — SPECIMEN STATUS REPORT

## 2018-09-26 LAB — BRAIN NATRIURETIC PEPTIDE: B Natriuretic Peptide: 230.4 pg/mL — ABNORMAL HIGH (ref 0.0–100.0)

## 2018-09-26 LAB — TROPONIN I: Troponin I: <0.03 ng/mL (ref <0.03)

## 2018-09-26 LAB — CBG MONITORING, ED: Glucose-Capillary: 288 mg/dL — ABNORMAL HIGH (ref 70–99)

## 2018-09-26 MED ORDER — FUROSEMIDE 10 MG/ML IJ SOLN
40.0000 mg | Freq: Two times a day (BID) | INTRAMUSCULAR | Status: DC
  Administered 2018-09-27 – 2018-09-29 (×5): 40 mg via INTRAVENOUS
  Filled 2018-09-26 (×5): qty 4

## 2018-09-26 MED ORDER — ONDANSETRON HCL 4 MG/2ML IJ SOLN: 4.0000 mg | Freq: Four times a day (QID) | INTRAMUSCULAR | Status: DC | PRN

## 2018-09-26 MED ORDER — VITAMIN D 25 MCG (1000 UNIT) PO TABS
1000.0000 [IU] | ORAL_TABLET | Freq: Every day | ORAL | Status: DC
  Administered 2018-09-27 – 2018-10-03 (×7): 1000 [IU] via ORAL
  Filled 2018-09-26 (×7): qty 1

## 2018-09-26 MED ORDER — NITROGLYCERIN 0.4 MG SL SUBL: 0.4000 mg | SUBLINGUAL_TABLET | SUBLINGUAL | Status: DC | PRN

## 2018-09-26 MED ORDER — ACETAMINOPHEN 325 MG PO TABS
650.0000 mg | ORAL_TABLET | ORAL | Status: DC | PRN
  Administered 2018-09-27 – 2018-10-03 (×5): 650 mg via ORAL
  Filled 2018-09-26 (×5): qty 2

## 2018-09-26 MED ORDER — LEVOTHYROXINE SODIUM 75 MCG PO TABS
75.0000 ug | ORAL_TABLET | Freq: Every day | ORAL | Status: DC
  Administered 2018-09-27 – 2018-10-03 (×7): 75 ug via ORAL
  Filled 2018-09-26 (×7): qty 1

## 2018-09-26 MED ORDER — POLYVINYL ALCOHOL 1.4 % OP SOLN
1.0000 [drp] | Freq: Four times a day (QID) | OPHTHALMIC | Status: DC
  Administered 2018-09-26 – 2018-10-03 (×27): 1 [drp] via OPHTHALMIC
  Filled 2018-09-26: qty 15

## 2018-09-26 MED ORDER — PANTOPRAZOLE SODIUM 20 MG PO TBEC
20.0000 mg | DELAYED_RELEASE_TABLET | Freq: Every day | ORAL | Status: DC
  Administered 2018-09-26 – 2018-10-03 (×8): 20 mg via ORAL
  Filled 2018-09-26 (×8): qty 1

## 2018-09-26 MED ORDER — INSULIN GLARGINE 100 UNIT/ML ~~LOC~~ SOLN
15.0000 [IU] | SUBCUTANEOUS | Status: DC
  Administered 2018-09-26: 15 [IU] via SUBCUTANEOUS
  Filled 2018-09-26: qty 0.15

## 2018-09-26 MED ORDER — MOMETASONE FURO-FORMOTEROL FUM 200-5 MCG/ACT IN AERO
2.0000 | INHALATION_SPRAY | Freq: Two times a day (BID) | RESPIRATORY_TRACT | Status: DC
  Administered 2018-09-26 – 2018-10-03 (×14): 2 via RESPIRATORY_TRACT
  Filled 2018-09-26: qty 8.8

## 2018-09-26 MED ORDER — PROSIGHT PO TABS
1.0000 | ORAL_TABLET | Freq: Every day | ORAL | Status: DC
  Administered 2018-09-27 – 2018-10-03 (×7): 1 via ORAL
  Filled 2018-09-26 (×8): qty 1

## 2018-09-26 MED ORDER — IPRATROPIUM BROMIDE 0.02 % IN SOLN
0.5000 mg | Freq: Four times a day (QID) | RESPIRATORY_TRACT | Status: DC
  Administered 2018-09-27 – 2018-09-28 (×5): 0.5 mg via RESPIRATORY_TRACT
  Filled 2018-09-26 (×6): qty 2.5

## 2018-09-26 MED ORDER — ALBUTEROL SULFATE (2.5 MG/3ML) 0.083% IN NEBU
5.0000 mg | INHALATION_SOLUTION | Freq: Once | RESPIRATORY_TRACT | Status: AC
  Administered 2018-09-26: 5 mg via RESPIRATORY_TRACT
  Filled 2018-09-26: qty 6

## 2018-09-26 MED ORDER — ALBUTEROL SULFATE (2.5 MG/3ML) 0.083% IN NEBU: 2.5000 mg | INHALATION_SOLUTION | RESPIRATORY_TRACT | Status: DC | PRN

## 2018-09-26 MED ORDER — FUROSEMIDE 10 MG/ML IJ SOLN
60.0000 mg | Freq: Once | INTRAMUSCULAR | Status: AC
  Administered 2018-09-26: 60 mg via INTRAVENOUS
  Filled 2018-09-26: qty 6

## 2018-09-26 MED ORDER — CARVEDILOL 3.125 MG PO TABS: 3.1250 mg | ORAL_TABLET | Freq: Two times a day (BID) | ORAL | Status: DC

## 2018-09-26 MED ORDER — SODIUM CHLORIDE 0.9 % IV SOLN: 250.0000 mL | INTRAVENOUS | Status: DC | PRN

## 2018-09-26 MED ORDER — IPRATROPIUM BROMIDE 0.02 % IN SOLN
0.5000 mg | RESPIRATORY_TRACT | Status: DC
  Filled 2018-09-26: qty 2.5

## 2018-09-26 MED ORDER — METHYLPREDNISOLONE SODIUM SUCC 125 MG IJ SOLR
125.0000 mg | INTRAMUSCULAR | Status: AC
  Administered 2018-09-26: 125 mg via INTRAVENOUS
  Filled 2018-09-26: qty 2

## 2018-09-26 MED ORDER — CARVEDILOL 3.125 MG PO TABS
3.1250 mg | ORAL_TABLET | Freq: Two times a day (BID) | ORAL | Status: DC
  Administered 2018-09-26 – 2018-10-03 (×12): 3.125 mg via ORAL
  Filled 2018-09-26 (×14): qty 1

## 2018-09-26 MED ORDER — HYDRALAZINE HCL 20 MG/ML IJ SOLN: 5.0000 mg | INTRAMUSCULAR | Status: DC | PRN

## 2018-09-26 MED ORDER — MONTELUKAST SODIUM 10 MG PO TABS
10.0000 mg | ORAL_TABLET | Freq: Every day | ORAL | Status: DC
  Administered 2018-09-27 – 2018-10-03 (×7): 10 mg via ORAL
  Filled 2018-09-26 (×7): qty 1

## 2018-09-26 MED ORDER — SODIUM CHLORIDE 0.9% FLUSH: 3.0000 mL | INTRAVENOUS | Status: DC | PRN

## 2018-09-26 MED ORDER — DM-GUAIFENESIN ER 30-600 MG PO TB12: 1.0000 | ORAL_TABLET | Freq: Two times a day (BID) | ORAL | Status: DC | PRN

## 2018-09-26 MED ORDER — GENTEAL TEARS NIGHT-TIME OP OINT: 1.0000 "application " | TOPICAL_OINTMENT | Freq: Every day | OPHTHALMIC | Status: DC

## 2018-09-26 MED ORDER — SODIUM CHLORIDE 0.9% FLUSH
3.0000 mL | Freq: Two times a day (BID) | INTRAVENOUS | Status: DC
  Administered 2018-09-26 – 2018-10-03 (×12): 3 mL via INTRAVENOUS

## 2018-09-26 MED ORDER — METHYLPREDNISOLONE SODIUM SUCC 125 MG IJ SOLR
60.0000 mg | Freq: Two times a day (BID) | INTRAMUSCULAR | Status: DC
  Administered 2018-09-27: 60 mg via INTRAVENOUS
  Filled 2018-09-26: qty 2

## 2018-09-26 MED ORDER — LEVOTHYROXINE SODIUM 100 MCG PO TABS: 100.0000 ug | ORAL_TABLET | Freq: Every day | ORAL | Status: DC

## 2018-09-26 MED ORDER — DILTIAZEM HCL 25 MG/5ML IV SOLN
5.0000 mg | Freq: Once | INTRAVENOUS | Status: AC
  Administered 2018-09-26: 5 mg via INTRAVENOUS
  Filled 2018-09-26: qty 5

## 2018-09-26 MED ORDER — IPRATROPIUM-ALBUTEROL 0.5-2.5 (3) MG/3ML IN SOLN
3.0000 mL | RESPIRATORY_TRACT | Status: DC
  Administered 2018-09-26: 3 mL via RESPIRATORY_TRACT

## 2018-09-26 MED ORDER — INSULIN ASPART 100 UNIT/ML ~~LOC~~ SOLN
0.0000 [IU] | Freq: Three times a day (TID) | SUBCUTANEOUS | Status: DC
  Administered 2018-09-27: 9 [IU] via SUBCUTANEOUS

## 2018-09-26 MED ORDER — LEVALBUTEROL HCL 1.25 MG/0.5ML IN NEBU
1.2500 mg | INHALATION_SOLUTION | Freq: Four times a day (QID) | RESPIRATORY_TRACT | Status: DC
  Administered 2018-09-26: 1.25 mg via RESPIRATORY_TRACT
  Filled 2018-09-26: qty 0.5

## 2018-09-26 MED ORDER — PROPYLENE GLYCOL 0.6 % OP SOLN: 1.0000 [drp] | Freq: Two times a day (BID) | OPHTHALMIC | Status: DC

## 2018-09-26 MED ORDER — CARBOXYMETH-GLYC-POLYSORB PF 0.5-1-0.5 % OP SOLN: 1.0000 [drp] | Freq: Two times a day (BID) | OPHTHALMIC | Status: DC

## 2018-09-26 MED ORDER — ESCITALOPRAM OXALATE 10 MG PO TABS
20.0000 mg | ORAL_TABLET | Freq: Every day | ORAL | Status: DC
  Administered 2018-09-27 – 2018-10-03 (×7): 20 mg via ORAL
  Filled 2018-09-26 (×7): qty 2

## 2018-09-26 MED ORDER — ARTIFICIAL TEARS OPHTHALMIC OINT
1.0000 "application " | TOPICAL_OINTMENT | Freq: Every evening | OPHTHALMIC | Status: DC | PRN
  Filled 2018-09-26: qty 3.5

## 2018-09-26 NOTE — ED Notes (Signed)
Attempted to call report

## 2018-09-26 NOTE — Patient Outreach (Signed)
[facility] Care Management  [facility]  09/26/2018  Madison DakinVeta A Hogan 10/28/1933 [id number]   Reason for call: post discharge medication review  Unsuccessful telephone call attempt # 2 to patient.   HIPAA compliant voicemail left requesting a return call.  Plan:  I will make another outreach attempt to patient within 3-4 business days.  Berlin HunJennifer Amoni Morales, PharmD Clinical Pharmacist [facility] [phone]

## 2018-09-26 NOTE — H&P (Signed)
History and Physical    [name] KNL:[id number] DOB: 1933/09/26 DOA: 09/26/2018  Referring MD/NP/PA:   PCP: Mellody Dance, DO   Patient coming from:  The patient is coming from home.  At baseline, pt is independent for most of ADL.        Chief Complaint: SOB  HPI: [name] is a 85 y.o. female with medical history significant of hypertension, hyperlipidemia, COPD on 2 L nasal cannula oxygen, GERD, hypothyroidism, depression, dCHF, cirrhosis-nonalcoholic, CAD, OSA not on CPAP, obesity, thrombocytopenia, GI bleeding, interstitial lung disease, who presents with shortness of breath.  Patient was recently hospitalized from 1/27-1/29 due to shortness of breath secondary to suspected pneumonia, COPD exacerbation and CHF.  Patient was discharged home in stable condition.  Patient states that she started having shortness of breath again in the past several days, which has been progressively worsening.  She has dry cough, no fever or chills.  Denies chest pain.  Patient states that she has gained 30 pounds in the past several days.  No nausea, vomiting, diarrhea, abdominal pain, symptoms of UTI.  No unilateral weakness.  Addendum: after pt was transferred to the floor, she developed tachycardia with heart rate up to 130s.  Stat EKG showed questionable SVT versus atypical atrial fibrillation.  ED Course: pt was found to have BNP 230, negative troponin, pancytopenia (WBC 3.7, hemoglobin 9.8, platelets 50), electrolytes renal function okay, temperature normal, oxygen saturation 99% on 3 L l nasal cannula oxygen, chest x-ray is negative for infiltration.  Patient is placed on telemetry bed of observation.  Review of Systems:   General: no fevers, chills, has body weight gain, has fatigue HEENT: no blurry vision, hearing changes or sore throat Respiratory: has dyspnea, coughing, wheezing CV: no chest pain, no palpitations GI: no nausea, vomiting, abdominal pain, diarrhea, constipation GU: no  dysuria, burning on urination, increased urinary frequency, hematuria  Ext: has leg edema Neuro: no unilateral weakness, numbness, or tingling, no vision change or hearing loss Skin: no rash, no skin tear. MSK: No muscle spasm, no deformity, no limitation of range of movement in spin Heme: No easy bruising.  Travel history: No recent long distant travel.  Allergy:  Allergies  Allergen Reactions  . Ace Inhibitors Cough  . Benadryl [Diphenhydramine Hcl (Sleep)] Other (See Comments)    hypotension  . Penicillins Hives and Swelling    Swelling of arms Has patient had a PCN reaction causing immediate rash, facial/tongue/throat swelling, SOB or lightheadedness with hypotension: unknown Has patient had a PCN reaction causing severe rash involving mucus membranes or skin necrosis: no Has patient had a PCN reaction that required hospitalization: unknown Has patient had a PCN reaction occurring within the last 10 years: no, childhood allergy If all of the above answers are "NO", then may proceed with Cephalosporin use.   . Sulfonamide Derivatives Swelling    Past Medical History:  Diagnosis Date  . Cirrhosis, non-alcoholic ([place])   . Coronary artery disease    status post inferior wall myocardial infarction  . Diabetes mellitus   . Dyslipidemia   . Hypotension 08/02/2010   recent episodes of orthostatic hypotension  . Hypothyroidism   . Leg edema   . Myocardial infarct, old   . Neuropathy of hand    left hand numbness/neuropathy  . Obesity   . Sleep apnea     Past Surgical History:  Procedure Laterality Date  . ABDOMINAL HYSTERECTOMY    . CARDIAC CATHETERIZATION     The ejection  fraction is around 50%  . CHOLECYSTECTOMY    . CORONARY STENT PLACEMENT    . ESOPHAGOGASTRODUODENOSCOPY N/A 12/12/2012   Procedure: ESOPHAGOGASTRODUODENOSCOPY (EGD);  Surgeon: [name], MD;  Location: [name];  Service: Endoscopy;  Laterality: N/A;  . FLEXIBLE SIGMOIDOSCOPY N/A 12/12/2012     Procedure: FLEXIBLE SIGMOIDOSCOPY;  Surgeon: [name], MD;  Location: [facility];  Service: Endoscopy;  Laterality: N/A;  . TUBAL LIGATION    . WRIST SURGERY      Social History:  reports that she quit smoking about 45 years ago. She has never used smokeless tobacco. She reports that she does not drink alcohol or use drugs.  Family History:  Family History  Problem Relation Age of Onset  . Emphysema Brother        non smoker  . Diabetes Brother   . Hyperlipidemia Brother   . Hypertension Brother   . Heart disease Father   . Heart attack Father   . Diabetes Father   . Hyperlipidemia Father   . Hypertension Father   . Diabetes Mother   . Diabetes Sister   . Hyperlipidemia Sister   . Hypertension Sister   . Heart attack Daughter   . Diabetes Daughter   . Diabetes Son   . Suicidality Son   . Alcohol abuse Son   . Alcohol abuse Maternal Uncle   . Heart attack Paternal Grandfather   . Diabetes Daughter   . Cancer Son        melanoma  . Diabetes Son   . Diabetes Son      Prior to Admission medications   Medication Sig Start Date End Date Taking? Authorizing Provider  albuterol (PROAIR HFA) 108 (90 Base) MCG/ACT inhaler Inhale 2 puffs into the lungs every 6 (six) hours as needed for wheezing or shortness of breath. 09/17/18  Yes Opalski, [name], DO  Carboxymeth-Glyc-Polysorb PF (REFRESH OPTIVE MEGA-3) 0.5-1-0.5 % SOLN Apply 1-2 drops to eye 2 (two) times daily.    Yes [provider]  carvedilol (COREG) 3.125 MG tablet TAKE ONE TABLET BY MOUTH TWO TIMES DAILY WITH A MEAL Patient taking differently: Take 3.125 mg by mouth 2 (two) times daily with a meal.  05/21/18  Yes Opalski, [name], DO  Cholecalciferol (VITAMIN D3) 50 MCG (2000 UT) TABS Take 1 tablet by mouth daily.   Yes [provider]  escitalopram (LEXAPRO) 20 MG tablet TAKE 1 TABLET (20 MG TOTAL) BY MOUTH DAILY. 05/21/18  Yes Opalski, [name], DO  Fluticasone-Salmeterol (ADVAIR) 250-50  MCG/DOSE AEPB Inhale 1 puff into the lungs 2 (two) times daily. 09/17/18  Yes Opalski, [name], DO  furosemide (LASIX) 40 MG tablet Take 0.5 tablets (20 mg total) by mouth daily. 09/17/18  Yes Opalski, [name], DO  insulin aspart (NOVOLOG FLEXPEN) 100 UNIT/ML FlexPen Inject 30 Units into the skin 3 (three) times daily with meals. Patient taking differently: Inject 10 Units into the skin 3 (three) times daily with meals. Sliding scale per 2 units 09/17/18  Yes Aline August, MD  levothyroxine (SYNTHROID, LEVOTHROID) 100 MCG tablet Take 1 tablet (100 mcg total) by mouth daily. 09/04/18  Yes Opalski, Deborah, DO  montelukast (SINGULAIR) 10 MG tablet TAKE 1 TABLET BY MOUTH EVERY EVENING. Patient taking differently: Take 10 mg by mouth daily.  08/22/18  Yes Opalski, [name], DO  Multiple Vitamins-Minerals (VISION FORMULA/LUTEIN) TABS Take 1 tablet by mouth daily.   Yes [provider]  nitroGLYCERIN (NITROSTAT) 0.4 MG SL tablet Place 1 tab under the tongue  every 5 minutes as needed for chest pain. Please schedule appt for further refills. 3rd/final attempt. 05/22/18  Yes Nahser, Wonda Cheng, MD  pantoprazole (PROTONIX) 20 MG tablet Take 1 tablet (20 mg total) by mouth 2 (two) times daily. Patient taking differently: Take 20 mg by mouth daily.  09/17/18  Yes Opalski, [name], DO  Polyethyl Glycol-Propyl Glycol (SYSTANE) 0.4-0.3 % SOLN Apply 1 drop to eye 4 (four) times daily.    Yes [provider]  potassium chloride (K-DUR,KLOR-CON) 10 MEQ tablet Take 1 tablet (10 mEq total) by mouth 2 (two) times daily. Patient taking differently: Take 10 mEq by mouth daily.  08/22/18  Yes Opalski, [name], DO  Propylene Glycol (SYSTANE BALANCE) 0.6 % SOLN Apply 1 drop to eye 2 (two) times daily.   Yes [provider]  White Petrolatum-Mineral Oil (GENTEAL TEARS NIGHT-TIME) OINT Apply 1 application to eye at bedtime.   Yes [provider]  White Petrolatum-Mineral Oil (SYSTANE NIGHTTIME) OINT  Place 1 application into both eyes at bedtime as needed (irritation).    Yes [provider]  Blood Glucose Monitoring Suppl (FREESTYLE LITE) DEVI Use to check blood sugars 2-3 times daily 01/07/17   Opalski, Deborah, DO  EASY TOUCH PEN NEEDLES 31G X 5 MM MISC USE TO INJECT INSULIN 5 TIMES A DAY 07/25/18   Opalski, [name], DO  Insulin Glargine (LANTUS SOLOSTAR) 100 UNIT/ML Solostar Pen Inject 30-50 Units into the skin See admin instructions. Inject 30 units in the morning and 50 units in the evening. 09/17/18   Aline August, MD  losartan (COZAAR) 25 MG tablet Take 1 tablet (25 mg total) by mouth daily. Patient not taking: Reported on 09/25/2018 08/26/18   Mellody Dance, DO  ONE TOUCH ULTRA TEST test strip TEST BLOOD SUGAR 2-3 TIMES A DAY 05/01/18   Opalski, [name], DO  ONETOUCH DELICA LANCETS 78G MISC USE TO CHECK BLOOD SUGAR THREE TIMES A DAY 11/14/17   Mellody Dance, DO    Physical Exam: Vitals:   09/27/18 0120 09/27/18 0154 09/27/18 0237 09/27/18 0405  BP:  117/67  103/68  Pulse: (!) 120 (!) 113  (!) 116  Resp:    18  Temp:    97.6 F (36.4 C)  TempSrc:    Oral  SpO2:   97% 98%  Weight:    92.3 kg  Height:       General: Not in acute distress HEENT:       Eyes: PERRL, EOMI, no scleral icterus.       ENT: No discharge from the ears and nose, no pharynx injection, no tonsillar enlargement.        Neck: Difficult to assess JVD due to obesity, no bruit, no mass felt. Heme: No neck lymph node enlargement. Cardiac: S1/S2, irregular heartbeat, No murmurs, No gallops or rubs. Respiratory: Has decreased wheezing bilaterally, has mild wheezing bilaterally.Marland Kitchen GI: Soft, nondistended, nontender, no rebound pain, no organomegaly, BS present. GU: No hematuria Ext: 1+ pitting leg edema bilaterally. 2+DP/PT pulse bilaterally. Musculoskeletal: No joint deformities, No joint redness or warmth, no limitation of ROM in spin. Skin: No rashes.  Neuro: Alert, oriented X3, cranial nerves  II-XII grossly intact, moves all extremities normally. Psych: Patient is not psychotic, no suicidal or hemocidal ideation.  Labs on Admission: I have personally reviewed following labs and imaging studies  CBC: Recent Labs  Lab 09/25/18 1253 09/26/18 1518  WBC 5.1 3.7*  NEUTROABS 3.3 2.5  HGB 10.7* 9.8*  HCT 35.1 32.5*  MCV 82  84.0  PLT 64* 50*   Basic Metabolic Panel: Recent Labs  Lab 09/25/18 1253 09/26/18 1518 09/27/18 0226  NA 141 136 134*  K 5.9* 4.3 4.3  CL 98 99 96*  CO2 29 27 26   GLUCOSE 156* 335* 381*  BUN 8 12 15   CREATININE 0.81 0.84 1.20*  CALCIUM 9.0 8.5* 8.4*  MG 2.0  --   --   PHOS 2.7*  --   --    GFR: Estimated Creatinine Clearance: 36.6 mL/min (A) (by C-G formula based on SCr of 1.2 mg/dL (H)). Liver Function Tests: Recent Labs  Lab 09/25/18 1253 09/26/18 1518  AST 33 30  ALT 29 28  ALKPHOS 150* 116  BILITOT 0.9 1.0  PROT 6.4 6.1*  ALBUMIN 3.3* 2.7*   No results for input(s): LIPASE, AMYLASE in the last 168 hours. No results for input(s): AMMONIA in the last 168 hours. Coagulation Profile: Recent Labs  Lab 09/27/18 0226  INR 1.10   Cardiac Enzymes: Recent Labs  Lab 09/26/18 1518 09/27/18 0226  TROPONINI <0.03 <0.03   BNP (last 3 results) No results for input(s): PROBNP in the last 8760 hours. HbA1C: No results for input(s): HGBA1C in the last 72 hours. CBG: Recent Labs  Lab 09/26/18 2051  GLUCAP 288*   Lipid Profile: No results for input(s): CHOL, HDL, LDLCALC, TRIG, CHOLHDL, LDLDIRECT in the last 72 hours. Thyroid Function Tests: Recent Labs    09/27/18 0226  TSH 0.929   Anemia Panel: No results for input(s): VITAMINB12, FOLATE, FERRITIN, TIBC, IRON, RETICCTPCT in the last 72 hours. Urine analysis:    Component Value Date/Time   COLORURINE YELLOW 01/13/2016 2026   APPEARANCEUR CLEAR 01/13/2016 2026   LABSPEC 1.009 01/13/2016 2026   PHURINE 6.5 01/13/2016 2026   GLUCOSEU 250 (A) 01/13/2016 2026   HGBUR  NEGATIVE 01/13/2016 2026   BILIRUBINUR NEGATIVE 01/13/2016 2026   KETONESUR NEGATIVE 01/13/2016 2026   PROTEINUR NEGATIVE 01/13/2016 2026   UROBILINOGEN 0.2 12/12/2012 0920   NITRITE NEGATIVE 01/13/2016 2026   LEUKOCYTESUR NEGATIVE 01/13/2016 2026   Sepsis Labs: @LABRCNTIP (procalcitonin:4,lacticidven:4) )No results found for this or any previous visit (from the past 240 hour(s)).   Radiological Exams on Admission: Dg Chest 2 View  Result Date: 09/26/2018 CLINICAL DATA:  Shortness of breath EXAM: CHEST - 2 VIEW COMPARISON:  Chest radiograph 09/25/2018 FINDINGS: Monitoring leads overlie the patient. Stable cardiomegaly. Low lung volumes. Left basilar atelectasis. No pleural effusion or pneumothorax. Thoracic spine degenerative changes. IMPRESSION: Left basilar atelectasis.  No acute cardiopulmonary process. Electronically Signed   By: Lovey Newcomer M.D.   On: 09/26/2018 16:10   Dg Chest 2 View  Result Date: 09/25/2018 CLINICAL DATA:  85 year old female with shortness of breath, recent hospital admission EXAM: CHEST - 2 VIEW COMPARISON:  Prior chest x-ray 09/15/2018 FINDINGS: Stable cardiomegaly. Slightly increased diffuse interstitial prominence compared to prior. Chronic central bronchitic changes remain stable. Atherosclerotic calcifications again noted in the transverse aorta. No acute osseous abnormality. No pleural effusion or pneumothorax. IMPRESSION: 1. Slightly increased interstitial prominence bilaterally. Differential considerations include mild interstitial edema and atypical or viral respiratory infection. 2. Stable cardiomegaly. 3.  Aortic Atherosclerosis (ICD10-170.0) Electronically Signed   By: Jacqulynn Cadet M.D.   On: 09/25/2018 17:12     EKG: Independently reviewed.  Sinus rhythm, QTC 417, PAC, Q waves in inferior leads and V4-V6, T wave flattening.   Assessment/Plan Principal Problem:   Acute on chronic respiratory failure with hypoxia [facility]) Active Problems:   Coronary  artery  disease   ILD (interstitial lung disease) (HCC)   Thrombocytopenia, unspecified (HCC)   Depression   Insulin dependent diabetes mellitus with complications (HCC)   Gastroesophageal reflux disease   Pancytopenia (HCC)   COPD exacerbation (HCC)   Acute on chronic diastolic CHF (congestive heart failure) (HCC)   Acute on chronic respiratory failure with hypoxia: likely due to combination of acute on chronic diastolic CHF exacerbation and COPD exacerbation.  Patient has 1+ leg edema, elevated BNP 230, and body weight gain, indicating CHF exacerbation.  Patient has decreased air movement and mild wheezing on auscultation, indicating COPD exacerbation.  -Place patient on telemetry bed for observation - Treat CHF and COPD exacerbation as below - Nasal cannula oxygen to remain oxygen saturation above 93%  Acute on chronic diastolic CHF: -Lasix 40 mg bid by IV (pt was given 60 mg in ED) -2d echo -Daily weights -strict I/O's -Low salt diet  COPD exacerbation and ILD -Nebulizers: scheduled Atrovent and prn Xopenex Nebs - Dulera inhaler and sigulair -Solu-Medrol 60 mg IV bid -Mucinex for cough  -Incentive spirometry -Follow up sputum culture  CAD: no CP -prn NTG -continue coreg  Thrombocytopenia, unspecified and pancytopenia: Hemoglobin slightly dropped from 10.7 on 09/25/2018 to 9.8.  Platelets are 50.  No bleeding tendency.  Likely related to liver cirrhosis. -Follow-up with CBC  Depression: -Lexapro  Insulin dependent diabetes mellitus with complications ([place]): Last A1c 9.0 on 08/26/18, poorly controled. Patient was taking latus 30-50 units bid which she stopped taking per report. -start Lantus 15 units bid -SSI  Gastroesophageal reflux disease: -protonix  Hypothyroidism: Last TSH was 0.281 on 08/26/18 -Continue home Synthroid, but decrease dose from 100 to 75 mcg daily -Check TSH in 6 weeks  Tachycardia: after pt was transferred to the floor, she developed tachycardia  with heart rate up to 130s.  Stat EKG showed questionable SVT versus atypical atrial fibrillation.  Patient is not a candidate for coagulants due to history of cirrhosis and thrombocytopenia. -HR improved after treated with IV metoprolol and Cardizem -Troponin x3, repeat EKG morning -Follow-up 2D echo. -continue coreg -Decreased Synthroid dose as above     DVT ppx: SCD Code Status: Full code Family Communication: None at bed side.    Disposition Plan:  Anticipate discharge back to previous home environment Consults called:  none Admission status: Obs / tele     Date of Service 09/27/2018    [facility]   If 7PM-7AM, please contact night-coverage [email or web address] Password [facility] 09/27/2018, 6:37 AM

## 2018-09-26 NOTE — ED Provider Notes (Signed)
[facility] EMERGENCY DEPARTMENT Provider Note   CSN: [id number] Arrival date & time: 09/26/18  1503     History   Chief Complaint Chief Complaint  Patient presents with  . Shortness of Breath    HPI [name] is a 85 y.o. female.  HPI Patient presents after recent hospitalization for pneumonia, now with concern for dyspnea, fatigue, weight gain. Patient has a history of CHF, CAD, cirrhosis. She notes that since discharge she is completed a course of steroids, completed a course of azithromycin, continues to take Lasix, but has had worsening of her overall condition, with dyspnea, particularly with exertion, fatigue. No objective fever, though there are reportedly fevers and chills, according to family member. Patient itself is awake and alert, though she defers to her 2 daughters who provide much of the HPI. She does corroborate the history, including declining condition since discharge last week, denies specific pain, denies substantial dyspnea at rest.  Past Medical History:  Diagnosis Date  . Cirrhosis, non-alcoholic ([facility])   . Coronary artery disease    status post inferior wall myocardial infarction  . Diabetes mellitus   . Dyslipidemia   . Hypotension 08/02/2010   recent episodes of orthostatic hypotension  . Hypothyroidism   . Leg edema   . Myocardial infarct, old   . Neuropathy of hand    left hand numbness/neuropathy  . Obesity   . Sleep apnea     Patient Active Problem List   Diagnosis Date Noted  . PNA (pneumonia) 09/15/2018  . Microalbuminuria due to type 2 diabetes mellitus ([place]) 08/26/2018  . Hypocalcemia 03/12/2018  . Gastroesophageal reflux disease 12/31/2017  . chronic Low platelet count 06/05/2017  . Hypertension associated with diabetes ([place]) 05/22/2017  . Morbidly obese ([place])- BMI >er 35 w DM 06/23/2016  . chronic Leukopenia 05/11/2016  . Vitamin D deficiency 05/11/2016  . Elevated serum alkaline phosphatase level-  hepatocellular in origin 05/11/2016  . Depression 03/13/2016  . Hypothyroidism 03/13/2016  . Insulin dependent diabetes mellitus with complications ([place]) 40/98/1191  . Mixed diabetic hyperlipidemia associated with type 1 diabetes mellitus ([place]) 03/13/2016  . Constipation, chronic 03/13/2016  . At high risk for osteoporosis 03/13/2016  . Hypotension 12/13/2012  . Hypokalemia 12/13/2012  . Thrombocytopenia, unspecified ([place]) 12/13/2012  . h/o Clostridium difficile colitis 12/12/2012  . History of GI bleed 12/12/2012  . ILD (interstitial lung disease) ([place]) 11/25/2012  . Coronary artery disease 04/11/2011  . COPD (chronic obstructive pulmonary disease) ([place]) 04/11/2011  . Obstructive sleep apnea 04/11/2011  . Heart failure, diastolic, chronic ([place]) 47/82/9562  . Atherosclerosis of carotid arteries 04/03/2006    Past Surgical History:  Procedure Laterality Date  . ABDOMINAL HYSTERECTOMY    . CARDIAC CATHETERIZATION     The ejection fraction is around 50%  . CHOLECYSTECTOMY    . CORONARY STENT PLACEMENT    . ESOPHAGOGASTRODUODENOSCOPY N/A 12/12/2012   Procedure: ESOPHAGOGASTRODUODENOSCOPY (EGD);  Surgeon: [name], MD;  Location: [name];  Service: Endoscopy;  Laterality: N/A;  . FLEXIBLE SIGMOIDOSCOPY N/A 12/12/2012   Procedure: FLEXIBLE SIGMOIDOSCOPY;  Surgeon: [name], MD;  Location: [facility];  Service: Endoscopy;  Laterality: N/A;  . TUBAL LIGATION    . WRIST SURGERY       OB History   No obstetric history on file.      Home Medications    Prior to Admission medications   Medication Sig Start Date End Date Taking? Authorizing Provider  albuterol (PROAIR HFA) 108 (90  Base) MCG/ACT inhaler Inhale 2 puffs into the lungs every 6 (six) hours as needed for wheezing or shortness of breath. 09/17/18  Yes Opalski, [name], DO  Carboxymeth-Glyc-Polysorb PF (REFRESH OPTIVE MEGA-3) 0.5-1-0.5 % SOLN Apply 1-2 drops to eye 2 (two) times daily.    Yes [provider]  carvedilol (COREG) 3.125 MG tablet TAKE ONE TABLET BY MOUTH TWO TIMES DAILY WITH A MEAL Patient taking differently: Take 3.125 mg by mouth 2 (two) times daily with a meal.  05/21/18  Yes Opalski, [name], DO  Cholecalciferol (VITAMIN D3) 50 MCG (2000 UT) TABS Take 1 tablet by mouth daily.   Yes [provider]  escitalopram (LEXAPRO) 20 MG tablet TAKE 1 TABLET (20 MG TOTAL) BY MOUTH DAILY. 05/21/18  Yes Opalski, [name], DO  Fluticasone-Salmeterol (ADVAIR) 250-50 MCG/DOSE AEPB Inhale 1 puff into the lungs 2 (two) times daily. 09/17/18  Yes Opalski, [name], DO  furosemide (LASIX) 40 MG tablet Take 0.5 tablets (20 mg total) by mouth daily. 09/17/18  Yes Opalski, [name], DO  insulin aspart (NOVOLOG FLEXPEN) 100 UNIT/ML FlexPen Inject 30 Units into the skin 3 (three) times daily with meals. Patient taking differently: Inject 10 Units into the skin 3 (three) times daily with meals. Sliding scale per 2 units 09/17/18  Yes Aline August, MD  levothyroxine (SYNTHROID, LEVOTHROID) 100 MCG tablet Take 1 tablet (100 mcg total) by mouth daily. 09/04/18  Yes Opalski, Deborah, DO  montelukast (SINGULAIR) 10 MG tablet TAKE 1 TABLET BY MOUTH EVERY EVENING. Patient taking differently: Take 10 mg by mouth daily.  08/22/18  Yes Opalski, [name], DO  Multiple Vitamins-Minerals (VISION FORMULA/LUTEIN) TABS Take 1 tablet by mouth daily.   Yes [provider]  nitroGLYCERIN (NITROSTAT) 0.4 MG SL tablet Place 1 tab under the tongue every 5 minutes as needed for chest pain. Please schedule appt for further refills. 3rd/final attempt. 05/22/18  Yes Nahser, Wonda Cheng, MD  pantoprazole (PROTONIX) 20 MG tablet Take 1 tablet (20 mg total) by mouth 2 (two) times daily. Patient taking differently: Take 20 mg by mouth daily.  09/17/18  Yes Opalski, [name], DO  Polyethyl Glycol-Propyl Glycol (SYSTANE) 0.4-0.3 % SOLN Apply 1 drop to eye 4 (four) times daily.    Yes [provider]  potassium  chloride (K-DUR,KLOR-CON) 10 MEQ tablet Take 1 tablet (10 mEq total) by mouth 2 (two) times daily. Patient taking differently: Take 10 mEq by mouth daily.  08/22/18  Yes Opalski, [name], DO  Propylene Glycol (SYSTANE BALANCE) 0.6 % SOLN Apply 1 drop to eye 2 (two) times daily.   Yes [provider]  White Petrolatum-Mineral Oil (GENTEAL TEARS NIGHT-TIME) OINT Apply 1 application to eye at bedtime.   Yes [provider]  White Petrolatum-Mineral Oil (SYSTANE NIGHTTIME) OINT Place 1 application into both eyes at bedtime as needed (irritation).    Yes [provider]  Blood Glucose Monitoring Suppl (FREESTYLE LITE) DEVI Use to check blood sugars 2-3 times daily 01/07/17   Opalski, Deborah, DO  EASY TOUCH PEN NEEDLES 31G X 5 MM MISC USE TO INJECT INSULIN 5 TIMES A DAY 07/25/18   Opalski, [name], DO  Insulin Glargine (LANTUS SOLOSTAR) 100 UNIT/ML Solostar Pen Inject 30-50 Units into the skin See admin instructions. Inject 30 units in the morning and 50 units in the evening. 09/17/18   Aline August, MD  losartan (COZAAR) 25 MG tablet Take 1 tablet (25 mg total) by mouth daily. Patient not taking: Reported on 09/25/2018 08/26/18   Mellody Dance, DO  ONE TOUCH ULTRA TEST test strip TEST BLOOD SUGAR 2-3 TIMES A DAY 05/01/18   Opalski, Deborah, DO  ONETOUCH DELICA LANCETS 61W MISC USE TO CHECK BLOOD SUGAR THREE TIMES A DAY 11/14/17   Mellody Dance, DO    Family History Family History  Problem Relation Age of Onset  . Emphysema Brother        non smoker  . Diabetes Brother   . Hyperlipidemia Brother   . Hypertension Brother   . Heart disease Father   . Heart attack Father   . Diabetes Father   . Hyperlipidemia Father   . Hypertension Father   . Diabetes Mother   . Diabetes Sister   . Hyperlipidemia Sister   . Hypertension Sister   . Heart attack Daughter   . Diabetes Daughter   . Diabetes Son   . Suicidality Son   . Alcohol abuse Son   . Alcohol abuse Maternal Uncle    . Heart attack Paternal Grandfather   . Diabetes Daughter   . Cancer Son        melanoma  . Diabetes Son   . Diabetes Son     Social History Social History   Tobacco Use  . Smoking status: Former Smoker    Last attempt to quit: 08/20/1973    Years since quitting: 45.1  . Smokeless tobacco: Never Used  Substance Use Topics  . Alcohol use: No  . Drug use: No     Allergies   Ace inhibitors; Benadryl [diphenhydramine hcl (sleep)]; Penicillins; and Sulfonamide derivatives   Review of Systems Review of Systems  Constitutional:       Per HPI, otherwise negative  HENT:       Per HPI, otherwise negative  Respiratory:       Per HPI, otherwise negative  Cardiovascular:       Per HPI, otherwise negative  Gastrointestinal: Negative for vomiting.  Endocrine:       Negative aside from HPI  Genitourinary:       Neg aside from HPI   Musculoskeletal:       Per HPI, otherwise negative  Skin: Negative.   Neurological: Positive for weakness. Negative for syncope.     Physical Exam Updated Vital Signs BP (!) 128/54   Pulse 84   Temp 98.3 F (36.8 C) (Oral)   Resp 20   SpO2 100%   Physical Exam Vitals signs and nursing note reviewed.  Constitutional:      General: She is not in acute distress.    Appearance: She is well-developed.  HENT:     Head: Normocephalic and atraumatic.  Eyes:     Conjunctiva/sclera: Conjunctivae normal.  Cardiovascular:     Rate and Rhythm: Normal rate and regular rhythm.  Pulmonary:     Effort: Pulmonary effort is normal. No respiratory distress.     Breath sounds: No stridor. Decreased breath sounds present. No wheezing.  Abdominal:     General: There is no distension.  Skin:    General: Skin is warm and dry.  Neurological:     Mental Status: She is alert and oriented to person, place, and time.     Cranial Nerves: No cranial nerve deficit.      ED Treatments / Results  Labs (all labs ordered are listed, but only abnormal  results are displayed) Labs Reviewed  COMPREHENSIVE METABOLIC PANEL - Abnormal; Notable for the following components:      Result Value   Glucose, Bld 335 (*)  Calcium 8.5 (*)    Total Protein 6.1 (*)    Albumin 2.7 (*)    All other components within normal limits  CBC WITH DIFFERENTIAL/PLATELET - Abnormal; Notable for the following components:   WBC 3.7 (*)    Hemoglobin 9.8 (*)    HCT 32.5 (*)    MCH 25.3 (*)    RDW 17.4 (*)    All other components within normal limits  TROPONIN I  BRAIN NATRIURETIC PEPTIDE    EKG EKG Interpretation  Date/Time:  Friday September 26 2018 15:14:10 EST Ventricular Rate:  84 PR Interval:    QRS Duration: 100 QT Interval:  398 QTC Calculation: 417 R Axis:   80 Text Interpretation:  Sinus rhythm , abberant conduction Supraventricular bigeminy Inferior infarct, age indeterminate T wave abnormality Abnormal ekg Confirmed by Carmin Muskrat [phone]) on 09/26/2018 3:23:14 PM   Radiology Dg Chest 2 View  Result Date: 09/26/2018 CLINICAL DATA:  Shortness of breath EXAM: CHEST - 2 VIEW COMPARISON:  Chest radiograph 09/25/2018 FINDINGS: Monitoring leads overlie the patient. Stable cardiomegaly. Low lung volumes. Left basilar atelectasis. No pleural effusion or pneumothorax. Thoracic spine degenerative changes. IMPRESSION: Left basilar atelectasis.  No acute cardiopulmonary process. Electronically Signed   By: Lovey Newcomer M.D.   On: 09/26/2018 16:10   Dg Chest 2 View  Result Date: 09/25/2018 CLINICAL DATA:  85 year old female with shortness of breath, recent hospital admission EXAM: CHEST - 2 VIEW COMPARISON:  Prior chest x-ray 09/15/2018 FINDINGS: Stable cardiomegaly. Slightly increased diffuse interstitial prominence compared to prior. Chronic central bronchitic changes remain stable. Atherosclerotic calcifications again noted in the transverse aorta. No acute osseous abnormality. No pleural effusion or pneumothorax. IMPRESSION: 1. Slightly increased  interstitial prominence bilaterally. Differential considerations include mild interstitial edema and atypical or viral respiratory infection. 2. Stable cardiomegaly. 3.  Aortic Atherosclerosis (ICD10-170.0) Electronically Signed   By: Jacqulynn Cadet M.D.   On: 09/25/2018 17:12    Procedures Procedures (including critical care time)  Medications Ordered in ED Medications - No data to display   Initial Impression / Assessment and Plan / ED Course  I have reviewed the triage vital signs and the nursing notes.  Pertinent labs & imaging results that were available during my care of the patient were reviewed by me and considered in my medical decision making (see chart for details).   Update:, Patient continues to have increased work of breathing. Update: Family, patient aware of all findings including generally reassuring x-ray, labs consistent with persistent heart failure. Chart review performed, notable for history of interstitial lung disease, CHF, COPD.  Discharge diagnosis from last hospitalization notable for demonstration of empiric antibiotic provision, but normal x-rays during hospitalization, no evidence for bacterial pneumonia, negative viral testing performed within the past week also normal. With this information, and her presentation for worsening dyspnea, fluid retention, there is suspicion for multifactorial episode, including CHF, COPD, and interstitial lung disease. Patient received IV Lasix, inhaled albuterol, IV steroids, was admitted for further evaluation and management.    Final Clinical Impressions(s) / ED Diagnoses  Shortness of breath   Carmin Muskrat, MD 09/26/18 [phone]

## 2018-09-26 NOTE — ED Notes (Signed)
Purewick placed on pt. 

## 2018-09-26 NOTE — ED Triage Notes (Signed)
Pt here from home with c/o some sob more with movement , pt was at her MD office yesterday

## 2018-09-26 NOTE — Telephone Encounter (Signed)
Patient thought she had an appointment today 09/26/18 at our office. Patient's daughter stated the PCP told them they had an appointment at our office today. Informed her daughter that patient's appointment is not until 10/06/18. Informed patient's daughter if patient needs to be seen sooner, we can try to get her in at a sooner date, but there are no appointments today. Informed patient's daughter that we do not take walk-ins (per policy). Advised patient's daughter if patient needs medical attention right away to go to the ED. Patient's daughter wanted to know if they can come back in the back to talk privately about her mother's condition that she has PNA and she did not want her mother catching the FLU in the ED. Started to suggest patient come back to check out to get an earlier appointment, but daughter changed her mind and stated I was being unprofessional and she would just take her mother to the ED. Then they just left and got on the elevator.

## 2018-09-27 ENCOUNTER — Other Ambulatory Visit: Payer: Self-pay

## 2018-09-27 ENCOUNTER — Observation Stay (HOSPITAL_COMMUNITY): Payer: PPO

## 2018-09-27 DIAGNOSIS — K219 Gastro-esophageal reflux disease without esophagitis: Secondary | ICD-10-CM | POA: Diagnosis present

## 2018-09-27 DIAGNOSIS — E785 Hyperlipidemia, unspecified: Secondary | ICD-10-CM | POA: Diagnosis present

## 2018-09-27 DIAGNOSIS — Z794 Long term (current) use of insulin: Secondary | ICD-10-CM | POA: Diagnosis not present

## 2018-09-27 DIAGNOSIS — Z79899 Other long term (current) drug therapy: Secondary | ICD-10-CM | POA: Diagnosis not present

## 2018-09-27 DIAGNOSIS — J9621 Acute and chronic respiratory failure with hypoxia: Secondary | ICD-10-CM | POA: Diagnosis present

## 2018-09-27 DIAGNOSIS — I252 Old myocardial infarction: Secondary | ICD-10-CM | POA: Diagnosis not present

## 2018-09-27 DIAGNOSIS — K7460 Unspecified cirrhosis of liver: Secondary | ICD-10-CM | POA: Diagnosis present

## 2018-09-27 DIAGNOSIS — G4733 Obstructive sleep apnea (adult) (pediatric): Secondary | ICD-10-CM | POA: Diagnosis present

## 2018-09-27 DIAGNOSIS — F329 Major depressive disorder, single episode, unspecified: Secondary | ICD-10-CM | POA: Diagnosis present

## 2018-09-27 DIAGNOSIS — J849 Interstitial pulmonary disease, unspecified: Secondary | ICD-10-CM | POA: Diagnosis present

## 2018-09-27 DIAGNOSIS — R188 Other ascites: Secondary | ICD-10-CM | POA: Diagnosis present

## 2018-09-27 DIAGNOSIS — I2510 Atherosclerotic heart disease of native coronary artery without angina pectoris: Secondary | ICD-10-CM | POA: Diagnosis present

## 2018-09-27 DIAGNOSIS — Z833 Family history of diabetes mellitus: Secondary | ICD-10-CM | POA: Diagnosis not present

## 2018-09-27 DIAGNOSIS — E1165 Type 2 diabetes mellitus with hyperglycemia: Secondary | ICD-10-CM | POA: Diagnosis present

## 2018-09-27 DIAGNOSIS — Z955 Presence of coronary angioplasty implant and graft: Secondary | ICD-10-CM | POA: Diagnosis not present

## 2018-09-27 DIAGNOSIS — Z79890 Hormone replacement therapy (postmenopausal): Secondary | ICD-10-CM | POA: Diagnosis not present

## 2018-09-27 DIAGNOSIS — Z8249 Family history of ischemic heart disease and other diseases of the circulatory system: Secondary | ICD-10-CM | POA: Diagnosis not present

## 2018-09-27 DIAGNOSIS — K7581 Nonalcoholic steatohepatitis (NASH): Secondary | ICD-10-CM | POA: Diagnosis present

## 2018-09-27 DIAGNOSIS — D61818 Other pancytopenia: Secondary | ICD-10-CM | POA: Diagnosis present

## 2018-09-27 DIAGNOSIS — I110 Hypertensive heart disease with heart failure: Secondary | ICD-10-CM | POA: Diagnosis present

## 2018-09-27 DIAGNOSIS — E039 Hypothyroidism, unspecified: Secondary | ICD-10-CM | POA: Diagnosis present

## 2018-09-27 DIAGNOSIS — J441 Chronic obstructive pulmonary disease with (acute) exacerbation: Secondary | ICD-10-CM | POA: Diagnosis not present

## 2018-09-27 DIAGNOSIS — K766 Portal hypertension: Secondary | ICD-10-CM | POA: Diagnosis present

## 2018-09-27 DIAGNOSIS — I5033 Acute on chronic diastolic (congestive) heart failure: Secondary | ICD-10-CM | POA: Diagnosis present

## 2018-09-27 DIAGNOSIS — R0602 Shortness of breath: Secondary | ICD-10-CM | POA: Diagnosis present

## 2018-09-27 LAB — CBC WITH DIFFERENTIAL/PLATELET
Abs Immature Granulocytes: 0.02 10*3/uL (ref 0.00–0.07)
Basophils Absolute: 0 10*3/uL (ref 0.0–0.1)
Basophils Relative: 0 %
Eosinophils Absolute: 0 10*3/uL (ref 0.0–0.5)
Eosinophils Relative: 0 %
HCT: 34.5 % — ABNORMAL LOW (ref 36.0–46.0)
Hemoglobin: 10.4 g/dL — ABNORMAL LOW (ref 12.0–15.0)
Immature Granulocytes: 1 %
Lymphocytes Relative: 6 %
Lymphs Abs: 0.2 10*3/uL — ABNORMAL LOW (ref 0.7–4.0)
MCH: 24.8 pg — ABNORMAL LOW (ref 26.0–34.0)
MCHC: 30.1 g/dL (ref 30.0–36.0)
MCV: 82.3 fL (ref 80.0–100.0)
Monocytes Absolute: 0.1 10*3/uL (ref 0.1–1.0)
Monocytes Relative: 1 %
Neutro Abs: 3.3 10*3/uL (ref 1.7–7.7)
Neutrophils Relative %: 92 %
Platelets: 46 10*3/uL — ABNORMAL LOW (ref 150–400)
RBC: 4.19 MIL/uL (ref 3.87–5.11)
RDW: 17.6 % — ABNORMAL HIGH (ref 11.5–15.5)
WBC: 3.6 10*3/uL — ABNORMAL LOW (ref 4.0–10.5)
nRBC: 0 % (ref 0.0–0.2)

## 2018-09-27 LAB — TROPONIN I
Troponin I: <0.03 ng/mL (ref <0.03)
Troponin I: <0.03 ng/mL (ref <0.03)
Troponin I: <0.03 ng/mL (ref <0.03)

## 2018-09-27 LAB — GLUCOSE, CAPILLARY
Glucose-Capillary: 375 mg/dL — ABNORMAL HIGH (ref 70–99)
Glucose-Capillary: 470 mg/dL — ABNORMAL HIGH (ref 70–99)
Glucose-Capillary: 438 mg/dL — ABNORMAL HIGH (ref 70–99)
Glucose-Capillary: 455 mg/dL — ABNORMAL HIGH (ref 70–99)

## 2018-09-27 LAB — BASIC METABOLIC PANEL
Anion gap: 12 (ref 5–15)
BUN: 15 mg/dL (ref 8–23)
CO2: 26 mmol/L (ref 22–32)
Calcium: 8.4 mg/dL — ABNORMAL LOW (ref 8.9–10.3)
Chloride: 96 mmol/L — ABNORMAL LOW (ref 98–111)
Creatinine, Ser: 1.2 mg/dL — ABNORMAL HIGH (ref 0.44–1.00)
GFR calc Af Amer: 49 mL/min — ABNORMAL LOW (ref >60)
GFR calc non Af Amer: 42 mL/min — ABNORMAL LOW (ref >60)
Glucose, Bld: 381 mg/dL — ABNORMAL HIGH (ref 70–99)
Potassium: 4.3 mmol/L (ref 3.5–5.1)
Sodium: 134 mmol/L — ABNORMAL LOW (ref 135–145)

## 2018-09-27 LAB — TSH: TSH: 0.929 u[IU]/mL (ref 0.350–4.500)

## 2018-09-27 LAB — PROTIME-INR
INR: 1.1
Prothrombin Time: 14.1 seconds (ref 11.4–15.2)

## 2018-09-27 LAB — GLUCOSE, RANDOM: Glucose, Bld: 473 mg/dL — ABNORMAL HIGH (ref 70–99)

## 2018-09-27 LAB — ECHOCARDIOGRAM COMPLETE
Height: 60 in
Weight: 3256 oz

## 2018-09-27 LAB — APTT: aPTT: 39 seconds — ABNORMAL HIGH (ref 24–36)

## 2018-09-27 MED ORDER — LEVALBUTEROL HCL 1.25 MG/0.5ML IN NEBU
1.2500 mg | INHALATION_SOLUTION | Freq: Four times a day (QID) | RESPIRATORY_TRACT | Status: DC
  Administered 2018-09-27 – 2018-09-28 (×5): 1.25 mg via RESPIRATORY_TRACT
  Filled 2018-09-27 (×6): qty 0.5

## 2018-09-27 MED ORDER — INSULIN ASPART 100 UNIT/ML ~~LOC~~ SOLN
0.0000 [IU] | Freq: Three times a day (TID) | SUBCUTANEOUS | Status: DC
  Administered 2018-09-27 – 2018-09-28 (×3): 20 [IU] via SUBCUTANEOUS
  Administered 2018-09-28 (×2): 15 [IU] via SUBCUTANEOUS
  Administered 2018-09-29: 4 [IU] via SUBCUTANEOUS
  Administered 2018-09-30: 3 [IU] via SUBCUTANEOUS

## 2018-09-27 MED ORDER — METOPROLOL TARTRATE 5 MG/5ML IV SOLN
2.5000 mg | Freq: Once | INTRAVENOUS | Status: AC
  Administered 2018-09-27: 2.5 mg via INTRAVENOUS

## 2018-09-27 MED ORDER — PREDNISONE 20 MG PO TABS
40.0000 mg | ORAL_TABLET | Freq: Every day | ORAL | Status: DC
  Administered 2018-09-28: 40 mg via ORAL
  Filled 2018-09-27: qty 2

## 2018-09-27 MED ORDER — INSULIN GLARGINE 100 UNIT/ML ~~LOC~~ SOLN
15.0000 [IU] | Freq: Two times a day (BID) | SUBCUTANEOUS | Status: DC
  Administered 2018-09-27 (×2): 15 [IU] via SUBCUTANEOUS
  Filled 2018-09-27 (×3): qty 0.15

## 2018-09-27 MED ORDER — INSULIN ASPART 100 UNIT/ML ~~LOC~~ SOLN
10.0000 [IU] | Freq: Once | SUBCUTANEOUS | Status: AC
  Administered 2018-09-27: 10 [IU] via SUBCUTANEOUS

## 2018-09-27 MED ORDER — METOPROLOL TARTRATE 5 MG/5ML IV SOLN
INTRAVENOUS | Status: AC
  Filled 2018-09-27: qty 5

## 2018-09-27 MED ORDER — PERFLUTREN LIPID MICROSPHERE
1.0000 mL | INTRAVENOUS | Status: AC | PRN
  Administered 2018-09-27: 5 mL via INTRAVENOUS
  Filled 2018-09-27: qty 10

## 2018-09-27 NOTE — Care Management Obs Status (Signed)
MEDICARE OBSERVATION STATUS NOTIFICATION   Patient Details  Name: [name] MRN: [id number] Date of Birth: 09-Oct-1933   Medicare Observation Status Notification Given:  Yes    Lawerance Sabal, RN 09/27/2018, 10:33 AM

## 2018-09-27 NOTE — Progress Notes (Signed)
NURSING PROGRESS NOTE  [name] [id number] Admission Data: 09/27/2018 4:40 AM Attending Provider: Clydie Braun, MD WUJ:[id number], Madison Pound, DO Code Status: Full  [name] is a 85 y.o. female patient admitted from ED:  -No acute distress noted.  -No complaints of shortness of breath.  -No complaints of chest pain.   Cardiac Monitoring: Box # 27 in place. Cardiac monitor yields:normal sinus rhythm, sinus tachycardia.  Blood pressure 103/68, pulse (!) 116, temperature 97.6 F (36.4 C), temperature source Oral, resp. rate 18, height 5' (1.524 m), weight 92.3 kg, SpO2 98 %.   IV Fluids:  IV in place, occlusive dsg intact without redness, IV cath antecubital right, condition patent and no redness none.   Allergies:  Ace inhibitors; Benadryl [diphenhydramine hcl (sleep)]; Penicillins; and Sulfonamide derivatives  Past Medical History:   has a past medical history of Cirrhosis, non-alcoholic ([facility]), Coronary artery disease, Diabetes mellitus, Dyslipidemia, Hypotension (08/02/2010), Hypothyroidism, Leg edema, Myocardial infarct, old, Neuropathy of hand, Obesity, and Sleep apnea.  Past Surgical History:   has a past surgical history that includes Cardiac catheterization; Coronary stent placement; Cholecystectomy; Abdominal hysterectomy; Tubal ligation; Esophagogastroduodenoscopy (N/A, 12/12/2012); Flexible sigmoidoscopy (N/A, 12/12/2012); and Wrist surgery.  Social History:   reports that she quit smoking about 45 years ago. She has never used smokeless tobacco. She reports that she does not drink alcohol or use drugs.  Skin: intact with bruising on right arm  Patient/Family orientated to room. Information packet given to patient/family. Admission inpatient armband information verified with patient/family to include name and date of birth and placed on patient arm. Side rails up x 2, fall assessment and education completed with patient/family. Patient/family able to verbalize understanding  of risk associated with falls and verbalized understanding to call for assistance before getting out of bed. Call light within reach. Patient/family able to voice and demonstrate understanding of unit orientation instructions.    Will continue to evaluate and treat per MD orders.

## 2018-09-27 NOTE — Plan of Care (Signed)
  Problem: Clinical Measurements: Goal: Ability to maintain clinical measurements within normal limits will improve Outcome: Progressing   

## 2018-09-27 NOTE — Progress Notes (Signed)
Notified oncall MD to inform of the patient's elevated Blood Pressure. An ordered was given for metoprolol 2.5mg  IV. Has been given. Will continue to monitor for patient's safety.

## 2018-09-28 LAB — COMPREHENSIVE METABOLIC PANEL
ALT: 25 U/L (ref 0–44)
AST: 33 U/L (ref 15–41)
Albumin: 3 g/dL — ABNORMAL LOW (ref 3.5–5.0)
Alkaline Phosphatase: 117 U/L (ref 38–126)
Anion gap: 11 (ref 5–15)
BUN: 27 mg/dL — ABNORMAL HIGH (ref 8–23)
CO2: 29 mmol/L (ref 22–32)
Calcium: 8.5 mg/dL — ABNORMAL LOW (ref 8.9–10.3)
Chloride: 91 mmol/L — ABNORMAL LOW (ref 98–111)
Creatinine, Ser: 1.09 mg/dL — ABNORMAL HIGH (ref 0.44–1.00)
GFR calc Af Amer: 55 mL/min — ABNORMAL LOW (ref >60)
GFR calc non Af Amer: 47 mL/min — ABNORMAL LOW (ref >60)
Glucose, Bld: 341 mg/dL — ABNORMAL HIGH (ref 70–99)
Potassium: 4.3 mmol/L (ref 3.5–5.1)
Sodium: 131 mmol/L — ABNORMAL LOW (ref 135–145)
Total Bilirubin: 1.2 mg/dL (ref 0.3–1.2)
Total Protein: 6.3 g/dL — ABNORMAL LOW (ref 6.5–8.1)

## 2018-09-28 LAB — BASIC METABOLIC PANEL
Anion gap: 11 (ref 5–15)
BUN: 28 mg/dL — ABNORMAL HIGH (ref 8–23)
CO2: 27 mmol/L (ref 22–32)
Calcium: 8.3 mg/dL — ABNORMAL LOW (ref 8.9–10.3)
Chloride: 95 mmol/L — ABNORMAL LOW (ref 98–111)
Creatinine, Ser: 1.15 mg/dL — ABNORMAL HIGH (ref 0.44–1.00)
GFR calc Af Amer: 51 mL/min — ABNORMAL LOW (ref >60)
GFR calc non Af Amer: 44 mL/min — ABNORMAL LOW (ref >60)
Glucose, Bld: 340 mg/dL — ABNORMAL HIGH (ref 70–99)
Potassium: 4.3 mmol/L (ref 3.5–5.1)
Sodium: 133 mmol/L — ABNORMAL LOW (ref 135–145)

## 2018-09-28 LAB — GLUCOSE, CAPILLARY
Glucose-Capillary: 349 mg/dL — ABNORMAL HIGH (ref 70–99)
Glucose-Capillary: 330 mg/dL — ABNORMAL HIGH (ref 70–99)
Glucose-Capillary: 377 mg/dL — ABNORMAL HIGH (ref 70–99)
Glucose-Capillary: 331 mg/dL — ABNORMAL HIGH (ref 70–99)
Glucose-Capillary: 292 mg/dL — ABNORMAL HIGH (ref 70–99)

## 2018-09-28 LAB — MAGNESIUM: Magnesium: 1.9 mg/dL (ref 1.7–2.4)

## 2018-09-28 LAB — TROPONIN I: Troponin I: <0.03 ng/mL (ref <0.03)

## 2018-09-28 MED ORDER — INSULIN GLARGINE 100 UNIT/ML ~~LOC~~ SOLN
30.0000 [IU] | Freq: Two times a day (BID) | SUBCUTANEOUS | Status: DC
  Filled 2018-09-28: qty 0.3

## 2018-09-28 MED ORDER — LEVALBUTEROL HCL 1.25 MG/0.5ML IN NEBU
1.2500 mg | INHALATION_SOLUTION | Freq: Two times a day (BID) | RESPIRATORY_TRACT | Status: DC
  Administered 2018-09-28 – 2018-10-02 (×9): 1.25 mg via RESPIRATORY_TRACT
  Filled 2018-09-28 (×9): qty 0.5

## 2018-09-28 MED ORDER — INSULIN GLARGINE 100 UNIT/ML ~~LOC~~ SOLN
35.0000 [IU] | Freq: Two times a day (BID) | SUBCUTANEOUS | Status: DC
  Administered 2018-09-28 – 2018-10-03 (×11): 35 [IU] via SUBCUTANEOUS
  Filled 2018-09-28 (×13): qty 0.35

## 2018-09-28 MED ORDER — INSULIN ASPART 100 UNIT/ML ~~LOC~~ SOLN
5.0000 [IU] | Freq: Three times a day (TID) | SUBCUTANEOUS | Status: DC
  Administered 2018-09-28 – 2018-09-30 (×6): 5 [IU] via SUBCUTANEOUS

## 2018-09-28 MED ORDER — IPRATROPIUM BROMIDE 0.02 % IN SOLN
0.5000 mg | Freq: Two times a day (BID) | RESPIRATORY_TRACT | Status: DC
  Administered 2018-09-28 – 2018-10-02 (×9): 0.5 mg via RESPIRATORY_TRACT
  Filled 2018-09-28 (×9): qty 2.5

## 2018-09-28 MED ORDER — ALBUMIN HUMAN 25 % IV SOLN
50.0000 g | Freq: Once | INTRAVENOUS | Status: AC
  Administered 2018-09-28: 50 g via INTRAVENOUS
  Filled 2018-09-28: qty 200

## 2018-09-28 NOTE — Progress Notes (Addendum)
Progress Note    [name]  ZOX:[id number] DOB: Dec 12, 1933  DOA: 09/26/2018 PCP: Thomasene Lot, DO    Brief Narrative:   Chief complaint: Shortness of breath  Medical records reviewed and are as summarized below:  [name] is an 85 y.o. female with past medical history significant for COPD on 2 L nasal cannula oxygen, HTN, HLD, hypothyroidism, depression, diastolic CHF, interstitial lung disease, and OSA not on CPAP; who presents with complaints of shortness of breath.  Assessment/Plan:   Principal Problem:   Acute on chronic respiratory failure with hypoxia (HCC) Active Problems:   Coronary artery disease   ILD (interstitial lung disease) (HCC)   Thrombocytopenia, unspecified (HCC)   Depression   Insulin dependent diabetes mellitus with complications (HCC)   Gastroesophageal reflux disease   Pancytopenia (HCC)   COPD exacerbation (HCC)   Acute on chronic diastolic CHF (congestive heart failure) (HCC)  Acute on chronic respiratory failure with hypoxia: Symptoms suspected to be due to combination of COPD and chronic diastolic CHF.  Also with tachycardia concern for pulmonary edema although chest x-ray from 2/7 did not note any signs of edema. -Continuous pulse oximetry with nasal cannula oxygen  COPD exacerbation, history of interstitial lung disease: Acute on chronic. Chest x-ray on 2/6 showed slightly increased interstitial prominence concerning for mild interstitial edema and atypical or viral respiratory infection along with stable cardiomegaly.  Chest x-ray   patient was placed on scheduled nebs, Solu-Medrol 60 mg IV twice daily and Mucinex. -Discontinued IV Solu-Medrol due to hyperglycemia -Prednisone 40 mg daily -Continue scheduled breathing treatment  Acute on chronic diastolic CHF: She had 1+ pitting edema on the lower extremities, reports weight gain of approximately 30 pounds over the last couple weeks, and BNP elevated to 230. Echocardiogram revealed EF of  40 - 45% with pseudonormalization and diastolic relaxation.  Patient has been started on Lasix IV 40 mg. -Strict I&O's and daily weights -Continue Lasix 40 mg IV twice daily  -Reassess IV diuresis in a.m.  Insulin-dependent diabetes mellitus type 2: Blood sugars in the 300s to 400s.  Hemoglobin A1c 9 on 08/26/2018. -Hypoglycemic protocols  -Lantus to 15 units twice daily -CBGs q. before meals and at bedtime with resistant sliding scale and -Adjust insulin regimen as needed  Pancytopenia: Acute on chronic.  Platelet count 50 which does not appear to be new, but hemoglobin down acutely from 10.7-9.8.  MCH was noted to be low.  Previous history of GI bleeds.  Cause thrombocytopenia thought to be related with cirrhosis as seen on previous imagings. -Check stool guaiac  Arrhythmia/suspected atrial fibrillation: Telemetry appears to show signs of atrial fibrillation or SVT into the 130s.  She had been treated with IV metoprolol and Cardizem with improvement of symptoms.  Troponins have been negative.  Question if related to levothyroxine dose.  Previously evaluated with similar symptoms in the past by cardiology per review of records in 2014, but appears to have been thought related to artifact at that time.  -Continue Coreg -Correct any electrolyte abnormalities -Follow-up telemetry overnight -Will need to consult cardiology, if symptoms persist  Hypothyroidism: Last TSH noted to be 0.281 on 08/26/2018.  Patient was decreased from 100 mcg to 75 mcg daily on admission. -Repeat TSH in 4 to 6 weeks.  CAD: Patient did not report any significant complaints of chest pain.  Previous history of inferior wall MI and stenting.  Patient not on aspirin due to history of thrombocytopenia and GI bleeds.  GERD -  Continue Protonix -Follow-up repeat EKG in a.m.   Obesity:Body mass index is 39.74 kg/m.   Family Communication/Anticipated D/C date and plan/Code Status   DVT prophylaxis: SCDs Code Status: Full  Code.  Family Communication: No family present at bedside Disposition Plan: Likely discharge home in 1 to 2 days   Medical Consultants:    None.   Anti-Infectives:    None  Subjective:   She reports having 30 pound weight gain over the last few weeks.  She makes note that her breathing not really improved.  Objective:    Vitals:   09/27/18 1957 09/27/18 2200 09/27/18 2341 09/28/18 0123  BP:  (!) 101/44 (!) 114/51   Pulse:  66 76   Resp:   18   Temp:   98.1 F (36.7 C)   TempSrc:   Oral   SpO2: 98%  100% 98%  Weight:      Height:        Intake/Output Summary (Last 24 hours) at 09/28/2018 0309 Last data filed at 09/28/2018 0100 Gross per 24 hour  Intake 720 ml  Output 1302 ml  Net -582 ml   Filed Weights   09/26/18 2003 09/26/18 2130 09/27/18 0405  Weight: 93.6 kg 93.6 kg 92.3 kg    Exam: Constitutional: Obese female in NAD, calm, comfortable Eyes: PERRL, lids and conjunctivae normal ENMT: Mucous membranes are moist. Posterior pharynx clear of any exudate or lesions.  Neck: normal, supple, no masses, no thyromegaly Respiratory: Decreased overall aeration, with minimal wheeze at this time.  Patient on 2 L nasal cannula oxygen and able to talk and near complete sentences at this time. Cardiovascular: Regular rate and rhythm, no murmurs / rubs / gallops.  +1 pitting edema extremity edema. 2+ pedal pulses. No carotid bruits.  Abdomen: no tenderness, no masses palpated. No hepatosplenomegaly. Bowel sounds positive.  Musculoskeletal: no clubbing / cyanosis. No joint deformity upper and lower extremities. Good ROM, no contractures. Normal muscle tone.  Skin: no rashes, lesions, ulcers. No induration Neurologic: CN 2-12 grossly intact. Sensation intact, DTR normal. Strength 5/5 in all 4.  Psychiatric: Normal judgment and insight. Alert and oriented x 3. Normal mood.    Data Reviewed:   I have personally reviewed following labs and imaging studies:  Labs: Labs show  the following:   Basic Metabolic Panel: Recent Labs  Lab 09/25/18 1253 09/26/18 1518 09/27/18 0226 09/27/18 0949  NA 141 136 134*  --   K 5.9* 4.3 4.3  --   CL 98 99 96*  --   CO2 29 27 26   --   GLUCOSE 156* 335* 381* 473*  BUN 8 12 15   --   CREATININE 0.81 0.84 1.20*  --   CALCIUM 9.0 8.5* 8.4*  --   MG 2.0  --   --   --   PHOS 2.7*  --   --   --    GFR Estimated Creatinine Clearance: 36.6 mL/min (A) (by C-G formula based on SCr of 1.2 mg/dL (H)). Liver Function Tests: Recent Labs  Lab 09/25/18 1253 09/26/18 1518  AST 33 30  ALT 29 28  ALKPHOS 150* 116  BILITOT 0.9 1.0  PROT 6.4 6.1*  ALBUMIN 3.3* 2.7*   No results for input(s): LIPASE, AMYLASE in the last 168 hours. No results for input(s): AMMONIA in the last 168 hours. Coagulation profile Recent Labs  Lab 09/27/18 0226  INR 1.10    CBC: Recent Labs  Lab 09/25/18 1253 09/26/18 1518 09/27/18 [id number]  WBC 5.1 3.7* 3.6*  NEUTROABS 3.3 2.5 3.3  HGB 10.7* 9.8* 10.4*  HCT 35.1 32.5* 34.5*  MCV 82 84.0 82.3  PLT 64* 50* 46*   Cardiac Enzymes: Recent Labs  Lab 09/26/18 1518 09/27/18 0226 09/27/18 0949 09/27/18 1551  TROPONINI <0.03 <0.03 <0.03 <0.03   BNP (last 3 results) No results for input(s): PROBNP in the last 8760 hours. CBG: Recent Labs  Lab 09/27/18 0747 09/27/18 1207 09/27/18 1744 09/27/18 2035 09/28/18 0155  GLUCAP 375* 470* 438* 455* 349*   D-Dimer: No results for input(s): DDIMER in the last 72 hours. Hgb A1c: No results for input(s): HGBA1C in the last 72 hours. Lipid Profile: No results for input(s): CHOL, HDL, LDLCALC, TRIG, CHOLHDL, LDLDIRECT in the last 72 hours. Thyroid function studies: Recent Labs    09/27/18 0226  TSH 0.929   Anemia work up: No results for input(s): VITAMINB12, FOLATE, FERRITIN, TIBC, IRON, RETICCTPCT in the last 72 hours. Sepsis Labs: Recent Labs  Lab 09/25/18 1253 09/26/18 1518 09/27/18 0949  WBC 5.1 3.7* 3.6*    Microbiology No  results found for this or any previous visit (from the past 240 hour(s)).  Procedures and diagnostic studies:  Echocardiogram 09/27/2018: Impression as seen below  1. The left ventricle has mild-moderately reduced systolic function of 40-45%. The cavity size was normal. There is no increased left ventricular wall thickness. Echo evidence of pseudonormalization in diastolic relaxation Elevated left ventricular  end-diastolic pressure.  2. There is akinesis of the entire inferior left ventricular segment.  3. The right ventricle has normal systolic function. The cavity was normal. There is no increase in right ventricular wall thickness. Right ventricular systolic pressure could not be assessed.  4. The mitral valve is normal in structure. There is mild thickening and moderate calcification.  5. The tricuspid valve is normal in structure.  6. The aortic valve is normal in structure. There is mild thickening and mild calcification of the aortic valve.  Dg Chest 2 View  Result Date: 09/26/2018 CLINICAL DATA:  Shortness of breath EXAM: CHEST - 2 VIEW COMPARISON:  Chest radiograph 09/25/2018 FINDINGS: Monitoring leads overlie the patient. Stable cardiomegaly. Low lung volumes. Left basilar atelectasis. No pleural effusion or pneumothorax. Thoracic spine degenerative changes. IMPRESSION: Left basilar atelectasis.  No acute cardiopulmonary process. Electronically Signed   By: Annia Beltrew  Davis M.D.   On: 09/26/2018 16:10    Medications:   . carvedilol  3.125 mg Oral BID WC  . cholecalciferol  1,000 Units Oral Daily  . escitalopram  20 mg Oral Daily  . furosemide  40 mg Intravenous Q12H  . insulin aspart  0-20 Units Subcutaneous TID WC  . insulin glargine  15 Units Subcutaneous BID  . ipratropium  0.5 mg Nebulization Q6H  . levalbuterol  1.25 mg Nebulization Q6H  . levothyroxine  75 mcg Oral Daily  . mometasone-formoterol  2 puff Inhalation BID  . montelukast  10 mg Oral Daily  . multivitamin  1 tablet  Oral Daily  . pantoprazole  20 mg Oral Daily  . polyvinyl alcohol  1 drop Both Eyes QID  . predniSONE  40 mg Oral Q breakfast  . sodium chloride flush  3 mL Intravenous Q12H   Continuous Infusions: . sodium chloride       LOS: 1 day   Neha Waight A Eniola Cerullo  [facility]   *Please refer to [email or web address], password [email or web address] to get updated schedule on who will round on this patient, as hospitalists switch teams weekly. If  7PM-7AM, please contact night-coverage at [email or web address], password [email or web address] for any overnight needs.

## 2018-09-28 NOTE — Progress Notes (Addendum)
Progress Note    [name]  SHF:[id number] DOB: 1934/06/25  DOA: 09/26/2018 PCP: Mellody Dance, DO    Brief Narrative:   Chief complaint: Shortness of breath  Medical records reviewed and are as summarized below:  [name] is an 85 y.o. female with past medical history significant for COPD on 2 L nasal cannula oxygen, NASH/liver cirrhosis, chronic diastolic CHF, HTN, HLD, hypothyroidism, depression, and OSA not on CPAP; who presented with complaints of shortness of breath.  Assessment/Plan:   Acute on chronic respiratory failure with hypoxia -I suspect this is secondary to massive ascites, fluid overload, acute on chronic diastolic CHF in the background of cirrhosis/[place]  -20 pound weight gain in 1 month, she weighed 181lbs on 1/7 and weighed 203lbs today 2/9 -Discontinue steroids  -Will order paracentesis  -Continue IV Lasix today she only takes 20 mg of oral Lasix at home will need a higher dose along with Aldactone  -Wean O2 as tolerated   Massive ascites -Secondary to NASH/cirrhosis -Paracentesis -Will need gastroenterology follow-up post discharge, and fluid restriction along with Lasix and Aldactone, seen by [facility] in the past  Acute on chronic diastolic CHF -As above  COPD/chronic respiratory failure -On 2 L home O2 at baseline, do not suspect acute exacerbation, continue nebs, discontinue steroids  Uncontrolled diabetes mellitus -CBGs in the 300 range -Hemoglobin A1c was 9 recently, -Increase Lantus and add meal coverage NovoLog -Also discontinuing steroids will help  Pancytopenia: Acute on chronic.   -Due to cirrhosis, splenic sequestration  -Monitor  ?  Atrial arrhythmia/SVT -Resolved, continue Coreg -In sinus rhythm at this time -Echocardiogram with EF of 45%, slightly lower than baseline and chronic inferior wall motion abnormality -Due to chronic thrombocytopenia, liver cirrhosis, history of GI bleeds would not be appropriate for full dose  anticoagulation  Hypothyroidism: Last TSH noted to be 0.281 on 08/26/2018.  Patient was decreased from 100 mcg to 75 mcg daily on admission. -Repeat TSH in 4 to 6 weeks.  CAD: Patient did not report any significant complaints of chest pain.  Previous history of inferior wall MI and stenting.  Patient not on aspirin due to history of thrombocytopenia and GI bleeds.  GERD -Continue Protonix -Follow-up repeat EKG in a.m.  Obesity:Body mass index is 39.74 kg/m.   Family Communication/Anticipated D/C date and plan/Code Status   DVT prophylaxis: SCDs Code Status: Full Code.  Family Communication: Daughter at bedside Disposition Plan: Home likely in 60 to 72 hours   Medical Consultants:    None.   Anti-Infectives:    None  Subjective:   -Still continues to feel really short of breath  Objective:    Vitals:   09/28/18 0123 09/28/18 0542 09/28/18 0756 09/28/18 1000  BP:  (!) 108/44  (!) 122/45  Pulse:  73  74  Resp:  18    Temp:  98.2 F (36.8 C)    TempSrc:  Oral    SpO2: 98% 100% 97%   Weight:  92.5 kg    Height:        Intake/Output Summary (Last 24 hours) at 09/28/2018 1135 Last data filed at 09/28/2018 0810 Gross per 24 hour  Intake 220 ml  Output 702 ml  Net -482 ml   Filed Weights   09/26/18 2130 09/27/18 0405 09/28/18 0542  Weight: 93.6 kg 92.3 kg 92.5 kg    Exam: Gen: Obese chronically ill-appearing female, sitting up in bed, no distress, AAO x3, no distress HEENT: PERRLA, Neck supple,  no JVD Lungs: Few basilar crackles CVS: RRR,No Gallops,Rubs or new Murmurs Abd: Soft, distended, positive fluid thrill, nontender, bowel sounds present Extremities: Trace edema Skin: no new rashes    Data Reviewed:   I have personally reviewed following labs and imaging studies:  Labs: Labs show the following:   Basic Metabolic Panel: Recent Labs  Lab 09/25/18 1253 09/26/18 1518 09/27/18 0226 09/27/18 0949 09/28/18 0519 09/28/18 0931  NA 141 136 134*   --  133* 131*  K 5.9* 4.3 4.3  --  4.3 4.3  CL 98 99 96*  --  95* 91*  CO2 29 27 26   --  27 29  GLUCOSE 156* 335* 381* 473* 340* 341*  BUN 8 12 15   --  28* 27*  CREATININE 0.81 0.84 1.20*  --  1.15* 1.09*  CALCIUM 9.0 8.5* 8.4*  --  8.3* 8.5*  MG 2.0  --   --   --  1.9  --   PHOS 2.7*  --   --   --   --   --    GFR Estimated Creatinine Clearance: 40.4 mL/min (A) (by C-G formula based on SCr of 1.09 mg/dL (H)). Liver Function Tests: Recent Labs  Lab 09/25/18 1253 09/26/18 1518 09/28/18 0931  AST 33 30 33  ALT 29 28 25   ALKPHOS 150* 116 117  BILITOT 0.9 1.0 1.2  PROT 6.4 6.1* 6.3*  ALBUMIN 3.3* 2.7* 3.0*   No results for input(s): LIPASE, AMYLASE in the last 168 hours. No results for input(s): AMMONIA in the last 168 hours. Coagulation profile Recent Labs  Lab 09/27/18 0226  INR 1.10    CBC: Recent Labs  Lab 09/25/18 1253 09/26/18 1518 09/27/18 0949  WBC 5.1 3.7* 3.6*  NEUTROABS 3.3 2.5 3.3  HGB 10.7* 9.8* 10.4*  HCT 35.1 32.5* 34.5*  MCV 82 84.0 82.3  PLT 64* 50* 46*   Cardiac Enzymes: Recent Labs  Lab 09/26/18 1518 09/27/18 0226 09/27/18 0949 09/27/18 1551 09/28/18 0519  TROPONINI <0.03 <0.03 <0.03 <0.03 <0.03   BNP (last 3 results) No results for input(s): PROBNP in the last 8760 hours. CBG: Recent Labs  Lab 09/27/18 1207 09/27/18 1744 09/27/18 2035 09/28/18 0155 09/28/18 0751  GLUCAP 470* 438* 455* 349* 330*   D-Dimer: No results for input(s): DDIMER in the last 72 hours. Hgb A1c: No results for input(s): HGBA1C in the last 72 hours. Lipid Profile: No results for input(s): CHOL, HDL, LDLCALC, TRIG, CHOLHDL, LDLDIRECT in the last 72 hours. Thyroid function studies: Recent Labs    09/27/18 0226  TSH 0.929   Anemia work up: No results for input(s): VITAMINB12, FOLATE, FERRITIN, TIBC, IRON, RETICCTPCT in the last 72 hours. Sepsis Labs: Recent Labs  Lab 09/25/18 1253 09/26/18 1518 09/27/18 0949  WBC 5.1 3.7* 3.6*     Microbiology No results found for this or any previous visit (from the past 240 hour(s)).  Procedures and diagnostic studies:  Echocardiogram 09/27/2018: Impression as seen below  1. The left ventricle has mild-moderately reduced systolic function of 92-11%. The cavity size was normal. There is no increased left ventricular wall thickness. Echo evidence of pseudonormalization in diastolic relaxation Elevated left ventricular  end-diastolic pressure.  2. There is akinesis of the entire inferior left ventricular segment.  3. The right ventricle has normal systolic function. The cavity was normal. There is no increase in right ventricular wall thickness. Right ventricular systolic pressure could not be assessed.  4. The mitral valve is normal in structure.  There is mild thickening and moderate calcification.  5. The tricuspid valve is normal in structure.  6. The aortic valve is normal in structure. There is mild thickening and mild calcification of the aortic valve.  Dg Chest 2 View  Result Date: 09/26/2018 CLINICAL DATA:  Shortness of breath EXAM: CHEST - 2 VIEW COMPARISON:  Chest radiograph 09/25/2018 FINDINGS: Monitoring leads overlie the patient. Stable cardiomegaly. Low lung volumes. Left basilar atelectasis. No pleural effusion or pneumothorax. Thoracic spine degenerative changes. IMPRESSION: Left basilar atelectasis.  No acute cardiopulmonary process. Electronically Signed   By: Lovey Newcomer M.D.   On: 09/26/2018 16:10    Medications:   . carvedilol  3.125 mg Oral BID WC  . cholecalciferol  1,000 Units Oral Daily  . escitalopram  20 mg Oral Daily  . furosemide  40 mg Intravenous Q12H  . insulin aspart  0-20 Units Subcutaneous TID WC  . insulin aspart  5 Units Subcutaneous TID WC  . insulin glargine  35 Units Subcutaneous BID  . ipratropium  0.5 mg Nebulization BID  . levalbuterol  1.25 mg Nebulization BID  . levothyroxine  75 mcg Oral Daily  . mometasone-formoterol  2 puff  Inhalation BID  . montelukast  10 mg Oral Daily  . multivitamin  1 tablet Oral Daily  . pantoprazole  20 mg Oral Daily  . polyvinyl alcohol  1 drop Both Eyes QID  . sodium chloride flush  3 mL Intravenous Q12H   Continuous Infusions: . sodium chloride       LOS: 1 day   [facility]

## 2018-09-29 ENCOUNTER — Encounter (HOSPITAL_COMMUNITY): Payer: Self-pay | Admitting: Physician Assistant

## 2018-09-29 ENCOUNTER — Encounter (INDEPENDENT_AMBULATORY_CARE_PROVIDER_SITE_OTHER): Payer: PPO | Admitting: Ophthalmology

## 2018-09-29 ENCOUNTER — Inpatient Hospital Stay (HOSPITAL_COMMUNITY): Payer: PPO

## 2018-09-29 HISTORY — PX: IR PARACENTESIS: IMG2679

## 2018-09-29 LAB — COMPREHENSIVE METABOLIC PANEL
ALT: 21 U/L (ref 0–44)
AST: 19 U/L (ref 15–41)
Albumin: 3.2 g/dL — ABNORMAL LOW (ref 3.5–5.0)
Alkaline Phosphatase: 92 U/L (ref 38–126)
Anion gap: 13 (ref 5–15)
BUN: 26 mg/dL — ABNORMAL HIGH (ref 8–23)
CO2: 30 mmol/L (ref 22–32)
Calcium: 8.3 mg/dL — ABNORMAL LOW (ref 8.9–10.3)
Chloride: 94 mmol/L — ABNORMAL LOW (ref 98–111)
Creatinine, Ser: 0.93 mg/dL (ref 0.44–1.00)
GFR calc Af Amer: >60 mL/min (ref >60)
GFR calc non Af Amer: 57 mL/min — ABNORMAL LOW (ref >60)
Glucose, Bld: 204 mg/dL — ABNORMAL HIGH (ref 70–99)
Potassium: 3.9 mmol/L (ref 3.5–5.1)
Sodium: 137 mmol/L (ref 135–145)
Total Bilirubin: 0.7 mg/dL (ref 0.3–1.2)
Total Protein: 6 g/dL — ABNORMAL LOW (ref 6.5–8.1)

## 2018-09-29 LAB — ALBUMIN, PLEURAL OR PERITONEAL FLUID: Albumin, Fluid: <1 g/dL

## 2018-09-29 LAB — GRAM STAIN

## 2018-09-29 LAB — CBC
HCT: 28.4 % — ABNORMAL LOW (ref 36.0–46.0)
Hemoglobin: 8.9 g/dL — ABNORMAL LOW (ref 12.0–15.0)
MCH: 25.6 pg — ABNORMAL LOW (ref 26.0–34.0)
MCHC: 31.3 g/dL (ref 30.0–36.0)
MCV: 81.6 fL (ref 80.0–100.0)
Platelets: 37 10*3/uL — ABNORMAL LOW (ref 150–400)
RBC: 3.48 MIL/uL — ABNORMAL LOW (ref 3.87–5.11)
RDW: 17.9 % — ABNORMAL HIGH (ref 11.5–15.5)
WBC: 5.3 10*3/uL (ref 4.0–10.5)
nRBC: 0 % (ref 0.0–0.2)

## 2018-09-29 LAB — GLUCOSE, CAPILLARY
Glucose-Capillary: 175 mg/dL — ABNORMAL HIGH (ref 70–99)
Glucose-Capillary: 79 mg/dL (ref 70–99)
Glucose-Capillary: 85 mg/dL (ref 70–99)
Glucose-Capillary: 134 mg/dL — ABNORMAL HIGH (ref 70–99)
Glucose-Capillary: 149 mg/dL — ABNORMAL HIGH (ref 70–99)

## 2018-09-29 LAB — BODY FLUID CELL COUNT WITH DIFFERENTIAL
Eos, Fluid: 0 %
Lymphs, Fluid: 70 %
Monocyte-Macrophage-Serous Fluid: 26 % — ABNORMAL LOW (ref 50–90)
Neutrophil Count, Fluid: 4 % (ref 0–25)
Total Nucleated Cell Count, Fluid: 92 cu mm (ref 0–1000)

## 2018-09-29 LAB — LACTATE DEHYDROGENASE, PLEURAL OR PERITONEAL FLUID: LD, Fluid: 34 U/L — ABNORMAL HIGH (ref 3–23)

## 2018-09-29 MED ORDER — LIDOCAINE HCL 1 % IJ SOLN
INTRAMUSCULAR | Status: AC
  Filled 2018-09-29: qty 20

## 2018-09-29 MED ORDER — ALBUMIN HUMAN 25 % IV SOLN
50.0000 g | Freq: Once | INTRAVENOUS | Status: AC
  Administered 2018-09-29: 50 g via INTRAVENOUS
  Filled 2018-09-29: qty 200

## 2018-09-29 MED ORDER — LIDOCAINE HCL (PF) 1 % IJ SOLN
INTRAMUSCULAR | Status: AC | PRN
  Administered 2018-09-29: 10 mL

## 2018-09-29 NOTE — Procedures (Signed)
PROCEDURE SUMMARY:  Successful US guided paracentesis from left lateral abdomen.  Yielded 2 L of hazy yellow fluid.  No immediate complications.  Patient tolerated well.  EBL = trace  Specimen was sent for labs.  WENDY S BLAIR PA-C 09/29/2018 2:25 PM

## 2018-09-29 NOTE — Plan of Care (Signed)

## 2018-09-29 NOTE — Progress Notes (Signed)
Progress Note    [name]  WFU:[id number] DOB: Aug 20, 1934  DOA: 09/26/2018 PCP: Mellody Dance, DO    Brief Narrative:   Chief complaint: Shortness of breath  Medical records reviewed and are as summarized below:  [name] is an 85 y.o. female with past medical history significant for COPD on 2 L nasal cannula oxygen, NASH/liver cirrhosis, chronic diastolic CHF, HTN, HLD, hypothyroidism, depression, and OSA not on CPAP; who presented with complaints of shortness of breath.  Assessment/Plan:   Acute on chronic respiratory failure with hypoxia -I suspect this is secondary to massive ascites, fluid overload, acute on chronic diastolic CHF in the background of cirrhosis/Nash  -20 pound weight gain in 1 month, she weighed 181lbs on 1/7 and weighed 203lbs on 2/9 -Ultrasound guided paracentesis ordered yesterday, this will happen today, will give IV albumin prior to this -Continue IV Lasix this morning, will hold p.m. dose -Wean O2 as tolerated   Massive ascites -Secondary to NASH/cirrhosis -Paracentesis today -Will need gastroenterology follow-up post discharge, and fluid restriction along with Lasix and Aldactone, seen by [facility] in the past  Acute on chronic diastolic CHF -As above  COPD/chronic respiratory failure -On 2 L home O2 at baseline, do not suspect acute exacerbation, continue nebs, discontinue steroids  Uncontrolled diabetes mellitus -Hemoglobin A1c was 9 recently, Continue Lantus and meal coverage NovoLog, sliding scale insulin, CBGs much better  Pancytopenia: Acute on chronic.   -Due to cirrhosis, splenic sequestration  -Monitor  ?  Atrial arrhythmia/SVT -Resolved, continue Coreg -In sinus rhythm at this time -Echocardiogram with EF of 45%, slightly lower than baseline and chronic inferior wall motion abnormality -Due to chronic thrombocytopenia, liver cirrhosis, history of GI bleeds would not be appropriate for full dose  anticoagulation  Hypothyroidism: Last TSH noted to be 0.281 on 08/26/2018.  Patient was decreased from 100 mcg to 75 mcg daily on admission. -Repeat TSH in 4 to 6 weeks.  CAD: Patient did not report any significant complaints of chest pain.  Previous history of inferior wall MI and stenting.  Patient not on aspirin due to history of thrombocytopenia and GI bleeds.  GERD -Continue Protonix -Follow-up repeat EKG in a.m.  Obesity:Body mass index is 39.74 kg/m.   Family Communication/Anticipated D/C date and plan/Code Status   DVT prophylaxis: SCDs Code Status: Full Code.  Family Communication: Daughter at bedside Disposition Plan: Home likely in 48 hours if stable   Medical Consultants:    None.   Anti-Infectives:    None  Subjective:   -Still feels short of breath, belly massively distended  Objective:    Vitals:   09/28/18 2300 09/29/18 0518 09/29/18 0812 09/29/18 0842  BP:  111/88  106/81  Pulse: 83 77  (!) 54  Resp:  18    Temp:  98.1 F (36.7 C)    TempSrc:  Oral    SpO2:  100% 99%   Weight:  92.3 kg    Height:        Intake/Output Summary (Last 24 hours) at 09/29/2018 1401 Last data filed at 09/29/2018 1247 Gross per 24 hour  Intake 440 ml  Output 301 ml  Net 139 ml   Filed Weights   09/27/18 0405 09/28/18 0542 09/29/18 0518  Weight: 92.3 kg 92.5 kg 92.3 kg    Exam: Gen: Awake, Alert, Oriented X 3, laying in bed, uncomfortable appearing HEENT: PERRLA, Neck supple, no JVD Lungs: Fine basilar crackles CVS: RRR,No Gallops,Rubs or new Murmurs Abd: Soft,  massively distended, positive fluid thrill, nontender, bowel sounds present Extremities: 1+ edema Skin: no new rashes   Data Reviewed:   I have personally reviewed following labs and imaging studies:  Labs: Labs show the following:   Basic Metabolic Panel: Recent Labs  Lab 09/25/18 1253  09/26/18 1518 09/27/18 0226 09/27/18 0949 09/28/18 0519 09/28/18 0931 09/29/18 0603  NA 141   --  136 134*  --  133* 131* 137  K 5.9*  --  4.3 4.3  --  4.3 4.3 3.9  CL 98  --  99 96*  --  95* 91* 94*  CO2 29  --  27 26  --  27 29 30   GLUCOSE 156*   < > 335* 381* 473* 340* 341* 204*  BUN 8  --  12 15  --  28* 27* 26*  CREATININE 0.81  --  0.84 1.20*  --  1.15* 1.09* 0.93  CALCIUM 9.0  --  8.5* 8.4*  --  8.3* 8.5* 8.3*  MG 2.0  --   --   --   --  1.9  --   --   PHOS 2.7*  --   --   --   --   --   --   --    < > = values in this interval not displayed.   GFR Estimated Creatinine Clearance: 47.3 mL/min (by C-G formula based on SCr of 0.93 mg/dL). Liver Function Tests: Recent Labs  Lab 09/25/18 1253 09/26/18 1518 09/28/18 0931 09/29/18 0603  AST 33 30 33 19  ALT 29 28 25 21   ALKPHOS 150* 116 117 92  BILITOT 0.9 1.0 1.2 0.7  PROT 6.4 6.1* 6.3* 6.0*  ALBUMIN 3.3* 2.7* 3.0* 3.2*   No results for input(s): LIPASE, AMYLASE in the last 168 hours. No results for input(s): AMMONIA in the last 168 hours. Coagulation profile Recent Labs  Lab 09/27/18 0226  INR 1.10    CBC: Recent Labs  Lab 09/25/18 1253 09/26/18 1518 09/27/18 0949 09/29/18 0603  WBC 5.1 3.7* 3.6* 5.3  NEUTROABS 3.3 2.5 3.3  --   HGB 10.7* 9.8* 10.4* 8.9*  HCT 35.1 32.5* 34.5* 28.4*  MCV 82 84.0 82.3 81.6  PLT 64* 50* 46* 37*   Cardiac Enzymes: Recent Labs  Lab 09/26/18 1518 09/27/18 0226 09/27/18 0949 09/27/18 1551 09/28/18 0519  TROPONINI <0.03 <0.03 <0.03 <0.03 <0.03   BNP (last 3 results) No results for input(s): PROBNP in the last 8760 hours. CBG: Recent Labs  Lab 09/28/18 1155 09/28/18 1643 09/28/18 2047 09/29/18 0752 09/29/18 1246  GLUCAP 377* 331* 292* 175* 79   D-Dimer: No results for input(s): DDIMER in the last 72 hours. Hgb A1c: No results for input(s): HGBA1C in the last 72 hours. Lipid Profile: No results for input(s): CHOL, HDL, LDLCALC, TRIG, CHOLHDL, LDLDIRECT in the last 72 hours. Thyroid function studies: Recent Labs    09/27/18 0226  TSH 0.929   Anemia  work up: No results for input(s): VITAMINB12, FOLATE, FERRITIN, TIBC, IRON, RETICCTPCT in the last 72 hours. Sepsis Labs: Recent Labs  Lab 09/25/18 1253 09/26/18 1518 09/27/18 0949 09/29/18 0603  WBC 5.1 3.7* 3.6* 5.3    Microbiology No results found for this or any previous visit (from the past 240 hour(s)).  Procedures and diagnostic studies:  Echocardiogram 09/27/2018: Impression as seen below  1. The left ventricle has mild-moderately reduced systolic function of 19-50%. The cavity size was normal. There is no increased left ventricular wall thickness.  Echo evidence of pseudonormalization in diastolic relaxation Elevated left ventricular  end-diastolic pressure.  2. There is akinesis of the entire inferior left ventricular segment.  3. The right ventricle has normal systolic function. The cavity was normal. There is no increase in right ventricular wall thickness. Right ventricular systolic pressure could not be assessed.  4. The mitral valve is normal in structure. There is mild thickening and moderate calcification.  5. The tricuspid valve is normal in structure.  6. The aortic valve is normal in structure. There is mild thickening and mild calcification of the aortic valve.  No results found.  Medications:   . carvedilol  3.125 mg Oral BID WC  . cholecalciferol  1,000 Units Oral Daily  . escitalopram  20 mg Oral Daily  . insulin aspart  0-20 Units Subcutaneous TID WC  . insulin aspart  5 Units Subcutaneous TID WC  . insulin glargine  35 Units Subcutaneous BID  . ipratropium  0.5 mg Nebulization BID  . levalbuterol  1.25 mg Nebulization BID  . levothyroxine  75 mcg Oral Daily  . lidocaine      . mometasone-formoterol  2 puff Inhalation BID  . montelukast  10 mg Oral Daily  . multivitamin  1 tablet Oral Daily  . pantoprazole  20 mg Oral Daily  . polyvinyl alcohol  1 drop Both Eyes QID  . sodium chloride flush  3 mL Intravenous Q12H   Continuous Infusions: . sodium  chloride    . albumin human       LOS: 2 days   [facility]

## 2018-09-30 LAB — BASIC METABOLIC PANEL
Anion gap: 9 (ref 5–15)
BUN: 18 mg/dL (ref 8–23)
CO2: 31 mmol/L (ref 22–32)
Calcium: 8.7 mg/dL — ABNORMAL LOW (ref 8.9–10.3)
Chloride: 99 mmol/L (ref 98–111)
Creatinine, Ser: 0.83 mg/dL (ref 0.44–1.00)
GFR calc Af Amer: >60 mL/min (ref >60)
GFR calc non Af Amer: >60 mL/min (ref >60)
Glucose, Bld: 79 mg/dL (ref 70–99)
Potassium: 3.8 mmol/L (ref 3.5–5.1)
Sodium: 139 mmol/L (ref 135–145)

## 2018-09-30 LAB — GLUCOSE, CAPILLARY
Glucose-Capillary: 85 mg/dL (ref 70–99)
Glucose-Capillary: 138 mg/dL — ABNORMAL HIGH (ref 70–99)
Glucose-Capillary: 113 mg/dL — ABNORMAL HIGH (ref 70–99)
Glucose-Capillary: 151 mg/dL — ABNORMAL HIGH (ref 70–99)

## 2018-09-30 LAB — CBC
HCT: 27.8 % — ABNORMAL LOW (ref 36.0–46.0)
Hemoglobin: 8.5 g/dL — ABNORMAL LOW (ref 12.0–15.0)
MCH: 25.1 pg — ABNORMAL LOW (ref 26.0–34.0)
MCHC: 30.6 g/dL (ref 30.0–36.0)
MCV: 82.2 fL (ref 80.0–100.0)
Platelets: 32 10*3/uL — ABNORMAL LOW (ref 150–400)
RBC: 3.38 MIL/uL — ABNORMAL LOW (ref 3.87–5.11)
RDW: 17.9 % — ABNORMAL HIGH (ref 11.5–15.5)
WBC: 4.2 10*3/uL (ref 4.0–10.5)
nRBC: 0 % (ref 0.0–0.2)

## 2018-09-30 MED ORDER — INSULIN ASPART 100 UNIT/ML ~~LOC~~ SOLN
0.0000 [IU] | Freq: Three times a day (TID) | SUBCUTANEOUS | Status: DC
  Administered 2018-10-01: 3 [IU] via SUBCUTANEOUS
  Administered 2018-10-01: 7 [IU] via SUBCUTANEOUS
  Administered 2018-10-02: 4 [IU] via SUBCUTANEOUS
  Administered 2018-10-02 – 2018-10-03 (×2): 7 [IU] via SUBCUTANEOUS

## 2018-09-30 MED ORDER — POTASSIUM CHLORIDE CRYS ER 20 MEQ PO TBCR
40.0000 meq | EXTENDED_RELEASE_TABLET | Freq: Every day | ORAL | Status: DC
  Administered 2018-09-30 – 2018-10-02 (×3): 40 meq via ORAL
  Filled 2018-09-30 (×3): qty 2

## 2018-09-30 MED ORDER — POLYETHYLENE GLYCOL 3350 17 G PO PACK: 17.0000 g | PACK | Freq: Every day | ORAL | Status: DC | PRN

## 2018-09-30 MED ORDER — FUROSEMIDE 10 MG/ML IJ SOLN
40.0000 mg | Freq: Two times a day (BID) | INTRAMUSCULAR | Status: DC
  Administered 2018-09-30 – 2018-10-02 (×5): 40 mg via INTRAVENOUS
  Filled 2018-09-30 (×5): qty 4

## 2018-09-30 MED ORDER — LORATADINE 10 MG PO TABS
10.0000 mg | ORAL_TABLET | Freq: Every day | ORAL | Status: DC
  Administered 2018-09-30 – 2018-10-03 (×4): 10 mg via ORAL
  Filled 2018-09-30 (×4): qty 1

## 2018-09-30 MED ORDER — INSULIN ASPART 100 UNIT/ML ~~LOC~~ SOLN
5.0000 [IU] | Freq: Three times a day (TID) | SUBCUTANEOUS | Status: DC
  Administered 2018-10-01 – 2018-10-03 (×8): 5 [IU] via SUBCUTANEOUS

## 2018-09-30 NOTE — Care Management Note (Signed)
Case Management Note  Patient Details  Name: [name] MRN: [id number] Date of Birth: [birth year]  Subjective/Objective:    Pt admitted with acute on chronic respiratory failure. She is from home with her daughter. Daughter works but comes home at lunch to check on her.  DME: shower chair, cane, walker, 3 in 1, oxygen at 2L [place] through [employer] Denies issues with medications. Daughter provides transportation.                Action/Plan: CM following for d/c needs, physician orders.  Expected Discharge Date:                  Expected Discharge Plan:     In-House Referral:     Discharge planning Services     Post Acute Care Choice:    Choice offered to:     DME Arranged:    DME Agency:     HH Arranged:    HH Agency:     Status of Service:  In process, will continue to follow  If discussed at Long Length of Stay Meetings, dates discussed:    Additional Comments:  Kermit Balo, RN 09/30/2018, 7:55 AM

## 2018-09-30 NOTE — Progress Notes (Signed)
Progress Note    [name]  [id number]:[id number] DOB: June 07, 1934  DOA: 09/26/2018 PCP: Mellody Dance, DO    Brief Narrative:   Chief complaint: Shortness of breath  Medical records reviewed and are as summarized below:  [name] is an 85 y.o. female with past medical history significant for COPD on 2 L nasal cannula oxygen, NASH/liver cirrhosis, chronic diastolic CHF, HTN, HLD, hypothyroidism, depression, and OSA not on CPAP; who presented with complaints of shortness of breath.  Assessment/Plan:   Acute on chronic respiratory failure with hypoxia -I suspect this is secondary to massive ascites, fluid overload, acute on chronic diastolic CHF in the background of cirrhosis/Nash  -20 pound weight gain in 1 month, she weighed 181lbs on 1/7 and weighed 203lbs on 2/9 -Ultrasound guided paracentesis done yesterday, fluid is consistent with portal hypertension from cirrhosis, unfortunately only 2 L drained she still has significant abdominal distention and fluid thrill, will request another round of paracentesis to drain another 3 to 4 L -She was given IV albumin yesterday -Continue IV Lasix today -PT eval, wean O2 as tolerated  Massive ascites -Secondary to NASH/cirrhosis -Underwent paracentesis 2/10, 2 L drained, needs another 3 to 4 L drained requested another round of paracentesis, given IV albumin yesterday -Will need gastroenterology follow-up post discharge, and fluid restriction along with Lasix and Aldactone, seen by [facility] in the past  Acute on chronic diastolic CHF -As above  COPD/chronic respiratory failure -On 2 L home O2 at baseline, do not suspect acute exacerbation, continue nebs, discontinue steroids  Uncontrolled diabetes mellitus -Hemoglobin A1c was 9 recently, Continue Lantus and meal coverage NovoLog, sliding scale insulin, CBGs much better  Anemia/thrombocytopenia: Acute on chronic.   -Due to cirrhosis, splenic sequestration  -Monitor  ?  Atrial  arrhythmia/SVT -Resolved, continue Coreg -In sinus rhythm at this time -Echocardiogram with EF of 45%, slightly lower than baseline and chronic inferior wall motion abnormality -Due to chronic thrombocytopenia, liver cirrhosis, history of GI bleeds would not be appropriate for full dose anticoagulation  Hypothyroidism: Last TSH noted to be 0.281 on 08/26/2018.  Patient was decreased from 100 mcg to 75 mcg daily on admission. -Repeat TSH in 4 to 6 weeks.  CAD: Patient did not report any significant complaints of chest pain.  Previous history of inferior wall MI and stenting.  Patient not on aspirin due to history of thrombocytopenia and GI bleeds.  GERD -Continue Protonix -Follow-up repeat EKG in a.m.  Obesity:Body mass index is 39.74 kg/m.   Family Communication/Anticipated D/C date and plan/Code Status   DVT prophylaxis: SCDs Code Status: Full Code.  Family Communication: Daughter at bedside Disposition Plan: Home likely in 48 hours if stable   Medical Consultants:    None.   Anti-Infectives:    None  Subjective:   -Underwent paracentesis yesterday, unfortunately only 2 L was drained  Objective:    Vitals:   09/29/18 1941 09/30/18 0327 09/30/18 0759 09/30/18 0929  BP: (!) 113/47 (!) 113/50  (!) 106/40  Pulse: 94 73  (!) 58  Resp: 18 18    Temp: 98.2 F (36.8 C) 98 F (36.7 C)    TempSrc: Oral Oral    SpO2: 100% 98% 97%   Weight:  91 kg    Height:        Intake/Output Summary (Last 24 hours) at 09/30/2018 1355 Last data filed at 09/30/2018 1322 Gross per 24 hour  Intake 613 ml  Output 1550 ml  Net -937 ml  Filed Weights   09/28/18 0542 09/29/18 0518 09/30/18 0327  Weight: 92.5 kg 92.3 kg 91 kg    Exam: Gen: Awake, Alert, Oriented X 3, chronically ill-appearing, no distress HEENT: PERRLA, Neck supple, no JVD Lungs: Bibasilar crackles CVS: RRR,No Gallops,Rubs or new Murmurs Abd: Soft, nontender, distended, positive fluid thrill Extremities: 1+  edema Skin: no new rashes   Data Reviewed:   I have personally reviewed following labs and imaging studies:  Labs: Labs show the following:   Basic Metabolic Panel: Recent Labs  Lab 09/25/18 1253  09/27/18 0226 09/27/18 0949 09/28/18 0519 09/28/18 0931 09/29/18 0603 09/30/18 0506  NA 141   < > 134*  --  133* 131* 137 139  K 5.9*   < > 4.3  --  4.3 4.3 3.9 3.8  CL 98   < > 96*  --  95* 91* 94* 99  CO2 29   < > 26  --  27 29 30 31   GLUCOSE 156*   < > 381* 473* 340* 341* 204* 79  BUN 8   < > 15  --  28* 27* 26* 18  CREATININE 0.81   < > 1.20*  --  1.15* 1.09* 0.93 0.83  CALCIUM 9.0   < > 8.4*  --  8.3* 8.5* 8.3* 8.7*  MG 2.0  --   --   --  1.9  --   --   --   PHOS 2.7*  --   --   --   --   --   --   --    < > = values in this interval not displayed.   GFR Estimated Creatinine Clearance: 52.6 mL/min (by C-G formula based on SCr of 0.83 mg/dL). Liver Function Tests: Recent Labs  Lab 09/25/18 1253 09/26/18 1518 09/28/18 0931 09/29/18 0603  AST 33 30 33 19  ALT 29 28 25 21   ALKPHOS 150* 116 117 92  BILITOT 0.9 1.0 1.2 0.7  PROT 6.4 6.1* 6.3* 6.0*  ALBUMIN 3.3* 2.7* 3.0* 3.2*   No results for input(s): LIPASE, AMYLASE in the last 168 hours. No results for input(s): AMMONIA in the last 168 hours. Coagulation profile Recent Labs  Lab 09/27/18 0226  INR 1.10    CBC: Recent Labs  Lab 09/25/18 1253 09/26/18 1518 09/27/18 0949 09/29/18 0603 09/30/18 0506  WBC 5.1 3.7* 3.6* 5.3 4.2  NEUTROABS 3.3 2.5 3.3  --   --   HGB 10.7* 9.8* 10.4* 8.9* 8.5*  HCT 35.1 32.5* 34.5* 28.4* 27.8*  MCV 82 84.0 82.3 81.6 82.2  PLT 64* 50* 46* 37* 32*   Cardiac Enzymes: Recent Labs  Lab 09/26/18 1518 09/27/18 0226 09/27/18 0949 09/27/18 1551 09/28/18 0519  TROPONINI <0.03 <0.03 <0.03 <0.03 <0.03   BNP (last 3 results) No results for input(s): PROBNP in the last 8760 hours. CBG: Recent Labs  Lab 09/29/18 1807 09/29/18 1849 09/29/18 2111 09/30/18 0738  09/30/18 1205  GLUCAP 85 134* 149* 85 138*   D-Dimer: No results for input(s): DDIMER in the last 72 hours. Hgb A1c: No results for input(s): HGBA1C in the last 72 hours. Lipid Profile: No results for input(s): CHOL, HDL, LDLCALC, TRIG, CHOLHDL, LDLDIRECT in the last 72 hours. Thyroid function studies: No results for input(s): TSH, T4TOTAL, T3FREE, THYROIDAB in the last 72 hours.  Invalid input(s): FREET3 Anemia work up: No results for input(s): VITAMINB12, FOLATE, FERRITIN, TIBC, IRON, RETICCTPCT in the last 72 hours. Sepsis Labs: Recent Labs  Lab 09/26/18 1518  09/27/18 0949 09/29/18 0603 09/30/18 0506  WBC 3.7* 3.6* 5.3 4.2    Microbiology Recent Results (from the past 240 hour(s))  Culture, body fluid-bottle     Status: None (Preliminary result)   Collection Time: 09/29/18  2:29 PM  Result Value Ref Range Status   Specimen Description PERITONEAL  Final   Special Requests NONE  Final   Culture   Final    NO GROWTH < 24 HOURS Performed at [facility], [place] [address], [place], [place] [address]    Report Status PENDING  Incomplete  Gram stain     Status: None   Collection Time: 09/29/18  2:29 PM  Result Value Ref Range Status   Specimen Description PERITONEAL  Final   Special Requests NONE  Final   Gram Stain   Final    WBC PRESENT, PREDOMINANTLY MONONUCLEAR NO ORGANISMS SEEN CYTOSPIN SMEAR Performed at [facility], [place] [address] [place], [place] [address]    Report Status 09/29/2018 FINAL  Final    Procedures and diagnostic studies:  Echocardiogram 09/27/2018: Impression as seen below  1. The left ventricle has mild-moderately reduced systolic function of 28-36%. The cavity size was normal. There is no increased left ventricular wall thickness. Echo evidence of pseudonormalization in diastolic relaxation Elevated left ventricular  end-diastolic pressure.  2. There is akinesis of the entire inferior left ventricular segment.  3. The right  ventricle has normal systolic function. The cavity was normal. There is no increase in right ventricular wall thickness. Right ventricular systolic pressure could not be assessed.  4. The mitral valve is normal in structure. There is mild thickening and moderate calcification.  5. The tricuspid valve is normal in structure.  6. The aortic valve is normal in structure. There is mild thickening and mild calcification of the aortic valve.  Ir Paracentesis  Result Date: 09/29/2018 INDICATION: Ascites secondary to non alcoholic cirrhosis. Request for diagnostic and therapeutic paracentesis. EXAM: ULTRASOUND GUIDED PARACENTESIS MEDICATIONS: 1% lidocaine 10 mL COMPLICATIONS: None immediate. PROCEDURE: Informed written consent was obtained from the patient after a discussion of the risks, benefits and alternatives to treatment. A timeout was performed prior to the initiation of the procedure. Initial ultrasound scanning demonstrates a small amount of ascites within the left lower abdominal quadrant. The left lower abdomen was prepped and draped in the usual sterile fashion. 1% lidocaine with epinephrine was used for local anesthesia. Following this, a 19 gauge, 10-cm, Yueh catheter was introduced. An ultrasound image was saved for documentation purposes. The paracentesis was performed. The catheter was removed and a dressing was applied. The patient tolerated the procedure well without immediate post procedural complication. FINDINGS: A total of approximately 2 L of hazy yellow fluid was removed. Samples were sent to the laboratory as requested by the clinical team. IMPRESSION: Successful ultrasound-guided paracentesis yielding 2 liters of peritoneal fluid. Read by: Gareth Eagle, PA-C Electronically Signed   By: Jerilynn Mages.  Shick M.D.   On: 09/29/2018 14:25    Medications:   . carvedilol  3.125 mg Oral BID WC  . cholecalciferol  1,000 Units Oral Daily  . escitalopram  20 mg Oral Daily  . furosemide  40 mg Intravenous  BID  . insulin aspart  0-20 Units Subcutaneous TID WC  . insulin aspart  5 Units Subcutaneous TID WC  . insulin glargine  35 Units Subcutaneous BID  . ipratropium  0.5 mg Nebulization BID  . levalbuterol  1.25 mg Nebulization BID  . levothyroxine  75 mcg Oral Daily  . loratadine  10 mg Oral Daily  . mometasone-formoterol  2 puff Inhalation BID  . montelukast  10 mg Oral Daily  . multivitamin  1 tablet Oral Daily  . pantoprazole  20 mg Oral Daily  . polyvinyl alcohol  1 drop Both Eyes QID  . potassium chloride  40 mEq Oral Daily  . sodium chloride flush  3 mL Intravenous Q12H   Continuous Infusions: . sodium chloride       LOS: 3 days   [facility]

## 2018-09-30 NOTE — Evaluation (Signed)
Physical Therapy Evaluation Patient Details Name: Madison DakinVeta A Alvi MRN: [id number] DOB: 06/09/1934 Today's Date: 09/30/2018   History of Present Illness  85 yo female with onset of acute on chronic respiratory failure was admitted for medical management, paracentesis done for 2L fluid removal.  UTI noted,  has SVT vs a-fib, ascites, pancytopenia.  PMHx:  PN, NSTEMI, HTN, CHF, COPD, 2L O2 at home, OSA, CAD, GI bleed, ILD, cirrhosis non alcoholic.  Clinical Impression  Pt was seen for mobility with no AD used, and is demonstrating awareness of safety, using a tank due to her sats dropping to 88% on room air in sitting.  Have walked on the hall with some cues for safety, and [facility].  Pt is not unsafe but has very limited endurance after the desaturation on room air.  Will follow up with assist for all mobility and expect her to transition home after SNF.  Pt may decline SNF.    Follow Up Recommendations SNF    Equipment Recommendations  None recommended by PT    Recommendations for Other Services       Precautions / Restrictions Precautions Precautions: Fall Restrictions Weight Bearing Restrictions: No      Mobility  Bed Mobility Overal bed mobility: Needs Assistance Bed Mobility: Supine to Sit;Sit to Supine     Supine to sit: Supervision Sit to supine: Supervision   General bed mobility comments: pt able to transition from supine to EOB with no physical assist, use of bed rails, HOB elevated  Transfers Overall transfer level: Needs assistance Equipment used: None Transfers: Sit to/from Stand;Stand Pivot Transfers Sit to Stand: Supervision;From elevated surface Stand pivot transfers: Supervision       General transfer comment: cued hand placement and then assisted to power up  Ambulation/Gait Ambulation/Gait assistance: Min assist Gait Distance (Feet): 75 Feet Assistive device: 1 person hand held assist Gait Pattern/deviations: Step-through  pattern;Decreased stride length;Wide base of support Gait velocity: reduced Gait velocity interpretation: <1.31 ft/sec, indicative of household ambulator General Gait Details: respiratory failure attributed to fluid overload  Stairs            Wheelchair Mobility    Modified Rankin (Stroke Patients Only)       Balance Overall balance assessment: Needs assistance Sitting-balance support: Feet supported Sitting balance-Leahy Scale: Good     Standing balance support: During functional activity;No upper extremity supported Standing balance-Leahy Scale: Fair Standing balance comment: no HHA during several ft of functional mobility in room                             Pertinent Vitals/Pain Pain Assessment: No/denies pain    Home Living Family/patient expects to be discharged to:: Private residence Living Arrangements: Children Available Help at Discharge: Family;Available PRN/intermittently Type of Home: House Home Access: Level entry     Home Layout: One level Home Equipment: Shower seat Additional Comments: Pt has been living alone and managing home independently    Prior Function Level of Independence: Independent         Comments: used O2 at night only     Hand Dominance   Dominant Hand: Right    Extremity/Trunk Assessment   Upper Extremity Assessment Upper Extremity Assessment: Generalized weakness    Lower Extremity Assessment Lower Extremity Assessment: Generalized weakness    Cervical / Trunk Assessment Cervical / Trunk Assessment: Kyphotic  Communication   Communication: No difficulties  Cognition Arousal/Alertness: Awake/alert Behavior  During Therapy: WFL for tasks assessed/performed Overall Cognitive Status: Within Functional Limits for tasks assessed                                 General Comments: Some very mild STM deficits, pt spread deodorant all over her forearms...unclear if this is normal to her or  evidence of cognitive deficits       General Comments General comments (skin integrity, edema, etc.): Pt will d/c to her daughters home    Exercises Other Exercises Other Exercises: hip abd, B knee flexion and L DF are 4- strength   Assessment/Plan    PT Assessment Patient needs continued PT services  PT Problem List Decreased strength;Decreased range of motion;Decreased activity tolerance;Decreased balance;Decreased mobility;Decreased coordination;Decreased knowledge of use of DME;Decreased safety awareness;Cardiopulmonary status limiting activity;Obesity       PT Treatment Interventions DME instruction;Gait training;Functional mobility training;Therapeutic activities;Therapeutic exercise;Balance training;Neuromuscular re-education;Patient/family education    PT Goals (Current goals can be found in the Care Plan section)  Acute Rehab PT Goals Patient Stated Goal: to walk and go home PT Goal Formulation: With patient/family Time For Goal Achievement: 10/14/18 Potential to Achieve Goals: Good    Frequency Min 3X/week   Barriers to discharge Decreased caregiver support living with daughter but is at work some of the time    Co-evaluation               AM-PAC PT "6 Clicks" Mobility  Outcome Measure Help needed turning from your back to your side while in a flat bed without using bedrails?: A Little Help needed moving from lying on your back to sitting on the side of a flat bed without using bedrails?: A Little Help needed moving to and from a bed to a chair (including a wheelchair)?: A Little Help needed standing up from a chair using your arms (e.g., wheelchair or bedside chair)?: A Little Help needed to walk in hospital room?: A Lot Help needed climbing 3-5 steps with a railing? : Total 6 Click Score: 15    End of Session Equipment Utilized During Treatment: Gait belt;Oxygen Activity Tolerance: Patient tolerated treatment well;Patient limited by fatigue;Treatment  limited secondary to medical complications (Comment) Patient left: in bed;with call bell/phone within reach;with bed alarm set;with family/visitor present Nurse Communication: Mobility status PT Visit Diagnosis: Unsteadiness on feet (R26.81);Muscle weakness (generalized) (M62.81);Adult, failure to thrive (R62.7);Difficulty in walking, not elsewhere classified (R26.2)    Time: 0929-1000 PT Time Calculation (min) (ACUTE ONLY): 31 min   Charges:   PT Evaluation $PT Eval Moderate Complexity: 1 Mod PT Treatments $Gait Training: 8-22 mins       Ivar Drape 09/30/2018, 3:45 PM  Samul Dada, PT MS Acute Rehab Dept. Number: [facility] [id number] and [facility] [phone]

## 2018-09-30 NOTE — Evaluation (Signed)
Occupational Therapy Evaluation Patient Details Name: [name] MRN: [id number] DOB: November 29, 1933 Today's Date: 09/30/2018    History of Present Illness 85 yo female with onset of acute on chronic respiratory failure was admitted for medical management, paracentesis done for 2L fluid removal.  UTI noted,  has SVT vs a-fib, ascites, pancytopenia.  PMHx:  PN, NSTEMI, HTN, CHF, COPD, 2L O2 at home, OSA, CAD, GI bleed, ILD, cirrhosis non alcoholic.   Clinical Impression   Pt presents to OT following hospital admission with slight deficits in safety and independence with ADLs and functional mobility. Pt would benefit from skilled OT intervention acutely to maximize return to PLOF. Pt currently requiring (S) for ADLs sit <> stand and in room transfers.     Follow Up Recommendations  No OT follow up    Equipment Recommendations  None recommended by OT    Recommendations for Other Services PT consult;Speech consult     Precautions / Restrictions Precautions Precautions: Fall Restrictions Weight Bearing Restrictions: No      Mobility Bed Mobility Overal bed mobility: Needs Assistance Bed Mobility: Supine to Sit;Sit to Supine     Supine to sit: Supervision Sit to supine: Supervision   General bed mobility comments: pt able to transition from supine to EOB with no physical assist, use of bed rails, HOB elevated  Transfers Overall transfer level: Needs assistance Equipment used: None Transfers: Sit to/from Stand;Stand Pivot Transfers Sit to Stand: Supervision;From elevated surface Stand pivot transfers: Supervision       General transfer comment: cued hand placement and then assisted to power up    Balance Overall balance assessment: Needs assistance Sitting-balance support: Feet supported Sitting balance-Leahy Scale: Good     Standing balance support: During functional activity;No upper extremity supported Standing balance-Leahy Scale: Fair Standing balance comment: no  HHA during several ft of functional mobility in room                           ADL either performed or assessed with clinical judgement   ADL Overall ADL's : Needs assistance/impaired Eating/Feeding: Modified independent;Sitting   Grooming: Wash/dry face;Oral care;Wash/dry hands;Supervision/safety;Standing   Upper Body Bathing: Modified independent;Sitting   Lower Body Bathing: Supervison/ safety;Sit to/from stand   Upper Body Dressing : Modified independent;Sitting   Lower Body Dressing: Supervision/safety;Sit to/from stand   Toilet Transfer: Supervision/safety;Stand-pivot;BSC   Toileting- Architect and Hygiene: Supervision/safety;Sit to/from stand       Functional mobility during ADLs: Supervision/safety General ADL Comments: (S) provided during SPT to [facility] and then several feet of functional mobility to sink with no AD     Vision Baseline Vision/History: Wears glasses Wears Glasses: Reading only Patient Visual Report: No change from baseline Vision Assessment?: No apparent visual deficits            Pertinent Vitals/Pain Pain Assessment: No/denies pain     Hand Dominance Right   Extremity/Trunk Assessment Upper Extremity Assessment Upper Extremity Assessment: Generalized weakness   Lower Extremity Assessment Lower Extremity Assessment: Generalized weakness   Cervical / Trunk Assessment Cervical / Trunk Assessment: Kyphotic   Communication Communication Communication: No difficulties   Cognition Arousal/Alertness: Awake/alert Behavior During Therapy: WFL for tasks assessed/performed Overall Cognitive Status: Within Functional Limits for tasks assessed                                 General Comments: Some very mild  STM deficits, pt spread deodorant all over her forearms...unclear if this is normal to her or evidence of cognitive deficits    General Comments  Pt will d/c to her daughters home    Exercises Exercises:  Other exercises Other Exercises Other Exercises: hip abd, B knee flexion and L DF are 4- strength     Home Living Family/patient expects to be discharged to:: Private residence Living Arrangements: Children Available Help at Discharge: Family;Available PRN/intermittently Type of Home: House Home Access: Level entry     Home Layout: One level     Bathroom Shower/Tub: Producer, television/film/video: Standard Bathroom Accessibility: Yes How Accessible: Accessible via walker Home Equipment: Shower seat   Additional Comments: Pt has been living alone and managing home independently      Prior Functioning/Environment Level of Independence: Independent        Comments: used O2 at night only        OT Problem List: Decreased activity tolerance;Impaired balance (sitting and/or standing);Cardiopulmonary status limiting activity      OT Treatment/Interventions: Self-care/ADL training;Therapeutic exercise;Energy conservation;DME and/or AE instruction;Therapeutic activities;Patient/family education;Balance training    OT Goals(Current goals can be found in the care plan section) Acute Rehab OT Goals Patient Stated Goal: to walk and go home OT Goal Formulation: With patient Time For Goal Achievement: 10/09/18 Potential to Achieve Goals: Good  OT Frequency: Min 2X/week    AM-PAC OT "6 Clicks" Daily Activity     Outcome Measure Help from another person eating meals?: None Help from another person taking care of personal grooming?: A Little Help from another person toileting, which includes using toliet, bedpan, or urinal?: A Little Help from another person bathing (including washing, rinsing, drying)?: A Little Help from another person to put on and taking off regular upper body clothing?: None Help from another person to put on and taking off regular lower body clothing?: A Little 6 Click Score: 20   End of Session Equipment Utilized During Treatment: Oxygen Nurse  Communication: Mobility status  Activity Tolerance: Patient tolerated treatment well Patient left: in bed;with call bell/phone within reach;with bed alarm set  OT Visit Diagnosis: Unsteadiness on feet (R26.81);Muscle weakness (generalized) (M62.81)                Time: 3893-7342 OT Time Calculation (min): 20 min Charges:  OT General Charges $OT Visit: 1 Visit OT Evaluation $OT Eval Low Complexity: 1 Low  Crissie Reese OTR/L  09/30/2018, 3:42 PM

## 2018-09-30 NOTE — Care Management Important Message (Signed)
Important Message  Patient Details  Name: [name] MRN: [id number] Date of Birth: 01/02/1934   Medicare Important Message Given:  Yes    Oralia Rud Amarea Macdowell 09/30/2018, 4:41 PM

## 2018-10-01 ENCOUNTER — Encounter (HOSPITAL_COMMUNITY): Payer: Self-pay | Admitting: Student

## 2018-10-01 ENCOUNTER — Inpatient Hospital Stay (HOSPITAL_COMMUNITY): Payer: PPO

## 2018-10-01 HISTORY — PX: IR PARACENTESIS: IMG2679

## 2018-10-01 LAB — GLUCOSE, CAPILLARY
Glucose-Capillary: 91 mg/dL (ref 70–99)
Glucose-Capillary: 202 mg/dL — ABNORMAL HIGH (ref 70–99)
Glucose-Capillary: 137 mg/dL — ABNORMAL HIGH (ref 70–99)
Glucose-Capillary: 145 mg/dL — ABNORMAL HIGH (ref 70–99)

## 2018-10-01 LAB — COMPREHENSIVE METABOLIC PANEL
ALT: 29 IU/L (ref 0–32)
AST: 33 IU/L (ref 0–40)
Albumin/Globulin Ratio: 1.1 — ABNORMAL LOW (ref 1.2–2.2)
Albumin: 3.3 g/dL — ABNORMAL LOW (ref 3.6–4.6)
Alkaline Phosphatase: 150 IU/L — ABNORMAL HIGH (ref 39–117)
BUN/Creatinine Ratio: 10 — ABNORMAL LOW (ref 12–28)
BUN: 8 mg/dL (ref 8–27)
Bilirubin Total: 0.9 mg/dL (ref 0.0–1.2)
CO2: 29 mmol/L (ref 20–29)
Calcium: 9 mg/dL (ref 8.7–10.3)
Chloride: 98 mmol/L (ref 96–106)
Creatinine, Ser: 0.81 mg/dL (ref 0.57–1.00)
GFR calc Af Amer: 78 mL/min/{1.73_m2} (ref >59)
GFR calc non Af Amer: 68 mL/min/{1.73_m2} (ref >59)
Globulin, Total: 3.1 g/dL (ref 1.5–4.5)
Glucose: 156 mg/dL — ABNORMAL HIGH (ref 65–99)
Potassium: 5.9 mmol/L — ABNORMAL HIGH (ref 3.5–5.2)
Sodium: 141 mmol/L (ref 134–144)
Total Protein: 6.4 g/dL (ref 6.0–8.5)

## 2018-10-01 LAB — CBC WITH DIFFERENTIAL/PLATELET
Basophils Absolute: 0 10*3/uL (ref 0.0–0.2)
Basos: 0 %
EOS (ABSOLUTE): 0.2 10*3/uL (ref 0.0–0.4)
Eos: 3 %
Hematocrit: 35.1 % (ref 34.0–46.6)
Hemoglobin: 10.7 g/dL — ABNORMAL LOW (ref 11.1–15.9)
Immature Grans (Abs): 0 10*3/uL (ref 0.0–0.1)
Immature Granulocytes: 0 %
Lymphocytes Absolute: 0.6 10*3/uL — ABNORMAL LOW (ref 0.7–3.1)
Lymphs: 11 %
MCH: 24.9 pg — ABNORMAL LOW (ref 26.6–33.0)
MCHC: 30.5 g/dL — ABNORMAL LOW (ref 31.5–35.7)
MCV: 82 fL (ref 79–97)
Monocytes Absolute: 1 10*3/uL — ABNORMAL HIGH (ref 0.1–0.9)
Monocytes: 20 %
Neutrophils Absolute: 3.3 10*3/uL (ref 1.4–7.0)
Neutrophils: 66 %
Platelets: 64 10*3/uL — CL (ref 150–450)
RBC: 4.29 x10E6/uL (ref 3.77–5.28)
RDW: 16.1 % — ABNORMAL HIGH (ref 11.7–15.4)
WBC: 5.1 10*3/uL (ref 3.4–10.8)

## 2018-10-01 LAB — PHOSPHORUS: Phosphorus: 2.7 mg/dL — ABNORMAL LOW (ref 3.0–4.3)

## 2018-10-01 LAB — BASIC METABOLIC PANEL
Anion gap: 8 (ref 5–15)
BUN: 15 mg/dL (ref 8–23)
CO2: 31 mmol/L (ref 22–32)
Calcium: 8.5 mg/dL — ABNORMAL LOW (ref 8.9–10.3)
Chloride: 99 mmol/L (ref 98–111)
Creatinine, Ser: 0.77 mg/dL (ref 0.44–1.00)
GFR calc Af Amer: >60 mL/min (ref >60)
GFR calc non Af Amer: >60 mL/min (ref >60)
Glucose, Bld: 92 mg/dL (ref 70–99)
Potassium: 3.8 mmol/L (ref 3.5–5.1)
Sodium: 138 mmol/L (ref 135–145)

## 2018-10-01 LAB — CBC
HCT: 28.9 % — ABNORMAL LOW (ref 36.0–46.0)
Hemoglobin: 8.6 g/dL — ABNORMAL LOW (ref 12.0–15.0)
MCH: 24.4 pg — ABNORMAL LOW (ref 26.0–34.0)
MCHC: 29.8 g/dL — ABNORMAL LOW (ref 30.0–36.0)
MCV: 82.1 fL (ref 80.0–100.0)
Platelets: 30 10*3/uL — ABNORMAL LOW (ref 150–400)
RBC: 3.52 MIL/uL — ABNORMAL LOW (ref 3.87–5.11)
RDW: 17.7 % — ABNORMAL HIGH (ref 11.5–15.5)
WBC: 2.9 10*3/uL — ABNORMAL LOW (ref 4.0–10.5)
nRBC: 0 % (ref 0.0–0.2)

## 2018-10-01 LAB — BRAIN NATRIURETIC PEPTIDE: BNP: 228.6 pg/mL — ABNORMAL HIGH (ref 0.0–100.0)

## 2018-10-01 LAB — MAGNESIUM: Magnesium: 2 mg/dL (ref 1.6–2.3)

## 2018-10-01 MED ORDER — LIDOCAINE HCL 1 % IJ SOLN
INTRAMUSCULAR | Status: AC | PRN
  Administered 2018-10-01: 10 mL

## 2018-10-01 MED ORDER — LIDOCAINE HCL 1 % IJ SOLN
INTRAMUSCULAR | Status: AC
  Filled 2018-10-01: qty 20

## 2018-10-01 MED ORDER — ALBUMIN HUMAN 25 % IV SOLN
25.0000 g | Freq: Once | INTRAVENOUS | Status: AC
  Administered 2018-10-01: 25 g via INTRAVENOUS
  Filled 2018-10-01: qty 100

## 2018-10-01 NOTE — Progress Notes (Signed)
Progress Note    [name]  TGG:[id number] DOB: Nov 30, 1933  DOA: 09/26/2018 PCP: Mellody Dance, DO    Brief Narrative:   Chief complaint: Shortness of breath  Medical records reviewed and are as summarized below:  [name] is an 85 y.o. female with past medical history significant for COPD on 2 L nasal cannula oxygen, NASH/liver cirrhosis, chronic diastolic CHF, HTN, HLD, hypothyroidism, depression, and OSA not on CPAP; who presented with complaints of shortness of breath.  Assessment/Plan:   Acute on chronic respiratory failure with hypoxia -I suspect this is secondary to massive ascites, fluid overload, acute on chronic diastolic CHF in the background of cirrhosis/[place]  -20 pound weight gain in 1 month, she weighed 181lbs on 1/7 and weighed 203lbs on 2/9 -Ultrasound guided paracentesis done on 09/29/2018 and 10/01/2018. fluid is consistent with portal hypertension from cirrhosis,  -She was given IV albumin -Continue IV Lasix today -PT eval, wean O2 as tolerated  Massive ascites -Secondary to NASH/cirrhosis -Underwent paracentesis 2/10, 2 L drained, needs another 3 to 4 L drained requested another round of paracentesis, given IV albumin yesterday -Will need gastroenterology follow-up post discharge, and fluid restriction along with Lasix and Aldactone, seen by [facility] in the past  Acute on chronic diastolic CHF -As above  COPD/chronic respiratory failure -On 2 L home O2 at baseline, do not suspect acute exacerbation, continue nebs, discontinue steroids  Uncontrolled diabetes mellitus with no significant complication. -Hemoglobin A1c was 9 recently, Continue Lantus and meal coverage NovoLog, sliding scale insulin, CBGs much better  Anemia/thrombocytopenia: Acute on chronic.   -Due to cirrhosis, splenic sequestration  -Monitor  ?  Atrial arrhythmia/SVT -Resolved, continue Coreg -In sinus rhythm at this time -Echocardiogram with EF of 45%, slightly lower than  baseline and chronic inferior wall motion abnormality -Due to chronic thrombocytopenia, liver cirrhosis, history of GI bleeds would not be appropriate for full dose anticoagulation therefore will continue medical management and outpatient referral with cardiology  Hypothyroidism: Last TSH noted to be 0.281 on 08/26/2018.  Patient was decreased from 100 mcg to 75 mcg daily on admission. -Repeat TSH in 4 to 6 weeks.  CAD: Patient did not report any significant complaints of chest pain.  Previous history of inferior wall MI and stenting.  Patient not on aspirin due to history of thrombocytopenia and GI bleeds.  GERD -Continue Protonix -Follow-up repeat EKG in a.m.  Obesity:Body mass index is 39.74 kg/m.   Family Communication/Anticipated D/C date and plan/Code Status   DVT prophylaxis: SCDs Code Status: Full Code.  Family Communication: Daughter at bedside Disposition Plan: Home likely in 48 hours if stable   Medical Consultants:    None.   Anti-Infectives:    None  Subjective:   Feeling somewhat better.  No nausea no vomiting.  Continues to have shortness of breath.  No diarrhea.  Objective:    Vitals:   10/01/18 0603 10/01/18 0824 10/01/18 1018 10/01/18 1204  BP: (!) 121/58  (!) 117/49 (!) 104/39  Pulse: 61  68 66  Resp: 18   18  Temp: 98.3 F (36.8 C)   98 F (36.7 C)  TempSrc: Oral   Oral  SpO2: 99% 99%  100%  Weight:      Height:        Intake/Output Summary (Last 24 hours) at 10/01/2018 1908 Last data filed at 10/01/2018 1700 Gross per 24 hour  Intake 805.85 ml  Output 2550 ml  Net -1744.15 ml   Danley Danker  Weights   09/29/18 0518 09/30/18 0327 10/01/18 0558  Weight: 92.3 kg 91 kg 86.5 kg    Exam: Gen: Awake, Alert, Oriented X 3, chronically ill-appearing, no distress HEENT: PERRLA, Neck supple, no JVD Lungs: Bibasilar crackles CVS: RRR,No Gallops,Rubs or new Murmurs Abd: Soft, nontender, distended, positive fluid thrill Extremities: 1+ edema Skin:  no new rashes   Data Reviewed:   I have personally reviewed following labs and imaging studies:  Labs: Labs show the following:   Basic Metabolic Panel: Recent Labs  Lab 09/25/18 1253  09/28/18 0519 09/28/18 0931 09/29/18 0603 09/30/18 0506 10/01/18 0426  NA 141   < > 133* 131* 137 139 138  K 5.9*   < > 4.3 4.3 3.9 3.8 3.8  CL 98   < > 95* 91* 94* 99 99  CO2 29   < > 27 29 30 31 31   GLUCOSE 156*   < > 340* 341* 204* 79 92  BUN 8   < > 28* 27* 26* 18 15  CREATININE 0.81   < > 1.15* 1.09* 0.93 0.83 0.77  CALCIUM 9.0   < > 8.3* 8.5* 8.3* 8.7* 8.5*  MG 2.0  --  1.9  --   --   --   --   PHOS 2.7*  --   --   --   --   --   --    < > = values in this interval not displayed.   GFR Estimated Creatinine Clearance: 53 mL/min (by C-G formula based on SCr of 0.77 mg/dL). Liver Function Tests: Recent Labs  Lab 09/25/18 1253 09/26/18 1518 09/28/18 0931 09/29/18 0603  AST 33 30 33 19  ALT 29 28 25 21   ALKPHOS 150* 116 117 92  BILITOT 0.9 1.0 1.2 0.7  PROT 6.4 6.1* 6.3* 6.0*  ALBUMIN 3.3* 2.7* 3.0* 3.2*   No results for input(s): LIPASE, AMYLASE in the last 168 hours. No results for input(s): AMMONIA in the last 168 hours. Coagulation profile Recent Labs  Lab 09/27/18 0226  INR 1.10    CBC: Recent Labs  Lab 09/25/18 1253 09/26/18 1518 09/27/18 0949 09/29/18 0603 09/30/18 0506 10/01/18 0426  WBC 5.1 3.7* 3.6* 5.3 4.2 2.9*  NEUTROABS 3.3 2.5 3.3  --   --   --   HGB 10.7* 9.8* 10.4* 8.9* 8.5* 8.6*  HCT 35.1 32.5* 34.5* 28.4* 27.8* 28.9*  MCV 82 84.0 82.3 81.6 82.2 82.1  PLT 64* 50* 46* 37* 32* 30*   Cardiac Enzymes: Recent Labs  Lab 09/26/18 1518 09/27/18 0226 09/27/18 0949 09/27/18 1551 09/28/18 0519  TROPONINI <0.03 <0.03 <0.03 <0.03 <0.03   BNP (last 3 results) No results for input(s): PROBNP in the last 8760 hours. CBG: Recent Labs  Lab 09/30/18 1646 09/30/18 2133 10/01/18 0723 10/01/18 1201 10/01/18 1649  GLUCAP 113* 151* 91 202* 137*    D-Dimer: No results for input(s): DDIMER in the last 72 hours. Hgb A1c: No results for input(s): HGBA1C in the last 72 hours. Lipid Profile: No results for input(s): CHOL, HDL, LDLCALC, TRIG, CHOLHDL, LDLDIRECT in the last 72 hours. Thyroid function studies: No results for input(s): TSH, T4TOTAL, T3FREE, THYROIDAB in the last 72 hours.  Invalid input(s): FREET3 Anemia work up: No results for input(s): VITAMINB12, FOLATE, FERRITIN, TIBC, IRON, RETICCTPCT in the last 72 hours. Sepsis Labs: Recent Labs  Lab 09/27/18 0949 09/29/18 0603 09/30/18 0506 10/01/18 0426  WBC 3.6* 5.3 4.2 2.9*    Microbiology Recent Results (from the past  240 hour(s))  Culture, body fluid-bottle     Status: None (Preliminary result)   Collection Time: 09/29/18  2:29 PM  Result Value Ref Range Status   Specimen Description PERITONEAL  Final   Special Requests NONE  Final   Culture   Final    NO GROWTH 2 DAYS Performed at [facility], [address], [place], [place] [address]    Report Status PENDING  Incomplete  Gram stain     Status: None   Collection Time: 09/29/18  2:29 PM  Result Value Ref Range Status   Specimen Description PERITONEAL  Final   Special Requests NONE  Final   Gram Stain   Final    WBC PRESENT, PREDOMINANTLY MONONUCLEAR NO ORGANISMS SEEN CYTOSPIN SMEAR Performed at [facility], [place] [address] [place], [place] [address]    Report Status 09/29/2018 FINAL  Final    Procedures and diagnostic studies:  Echocardiogram 09/27/2018: Impression as seen below  1. The left ventricle has mild-moderately reduced systolic function of 30-16%. The cavity size was normal. There is no increased left ventricular wall thickness. Echo evidence of pseudonormalization in diastolic relaxation Elevated left ventricular  end-diastolic pressure.  2. There is akinesis of the entire inferior left ventricular segment.  3. The right ventricle has normal systolic function. The cavity was  normal. There is no increase in right ventricular wall thickness. Right ventricular systolic pressure could not be assessed.  4. The mitral valve is normal in structure. There is mild thickening and moderate calcification.  5. The tricuspid valve is normal in structure.  6. The aortic valve is normal in structure. There is mild thickening and mild calcification of the aortic valve.  Ir Paracentesis  Result Date: 10/01/2018 INDICATION: Patient with history of abdominal distention, ascites. Request is made for therapeutic paracentesis of up to 4 L maximum. EXAM: ULTRASOUND GUIDED THERAPEUTIC PARACENTESIS MEDICATIONS: 10 mL 1% lidocaine COMPLICATIONS: None immediate. PROCEDURE: Informed written consent was obtained from the patient after a discussion of the risks, benefits and alternatives to treatment. A timeout was performed prior to the initiation of the procedure. Initial ultrasound scanning demonstrates a small amount of ascites within the left lateral abdomen. The right lower abdomen was prepped and draped in the usual sterile fashion. 1% lidocaine was used for local anesthesia. Following this, a Safe-T-Centesis catheter was introduced. An ultrasound image was saved for documentation purposes. The paracentesis was performed. The catheter was removed and a dressing was applied. The patient tolerated the procedure well without immediate post procedural complication. FINDINGS: A total of approximately 1.3 L of amber fluid was removed. IMPRESSION: Successful ultrasound-guided therapeutic paracentesis yielding 1.3 liters of peritoneal fluid. Read by: Brynda Greathouse PA-C Electronically Signed   By: Jacqulynn Cadet M.D.   On: 10/01/2018 09:47    Medications:   . carvedilol  3.125 mg Oral BID WC  . cholecalciferol  1,000 Units Oral Daily  . escitalopram  20 mg Oral Daily  . furosemide  40 mg Intravenous BID  . insulin aspart  0-20 Units Subcutaneous TID WC  . insulin aspart  5 Units Subcutaneous TID WC   . insulin glargine  35 Units Subcutaneous BID  . ipratropium  0.5 mg Nebulization BID  . levalbuterol  1.25 mg Nebulization BID  . levothyroxine  75 mcg Oral Daily  . lidocaine      . loratadine  10 mg Oral Daily  . mometasone-formoterol  2 puff Inhalation BID  . montelukast  10  mg Oral Daily  . multivitamin  1 tablet Oral Daily  . pantoprazole  20 mg Oral Daily  . polyvinyl alcohol  1 drop Both Eyes QID  . potassium chloride  40 mEq Oral Daily  . sodium chloride flush  3 mL Intravenous Q12H   Continuous Infusions: . sodium chloride       LOS: 4 days   [facility]

## 2018-10-01 NOTE — Progress Notes (Signed)
Occupational Therapy Treatment Patient Details Name: [name] MRN: [id number] DOB: Oct 21, 1933 Today's Date: 10/01/2018    History of present illness 85 yo female with onset of acute on chronic respiratory failure was admitted for medical management, paracentesis done for 2L fluid removal.  UTI noted,  has SVT vs a-fib, ascites, pancytopenia.  PMHx:  PN, NSTEMI, HTN, CHF, COPD, 2L O2 at home, OSA, CAD, GI bleed, ILD, cirrhosis non alcoholic.   OT comments  Pt demonstrated ability to ambulate in room and to bathroom and sink modified independently. No SOB noted. Pt typically uses 2L 02 at home. She performed dressing with set up. Pt plans to go to her daughter's home who will assist her with tub transfers and IADL. Son in law is home all day.   Follow Up Recommendations  No OT follow up    Equipment Recommendations  None recommended by OT    Recommendations for Other Services      Precautions / Restrictions         Mobility Bed Mobility Overal bed mobility: Independent             General bed mobility comments: no assist, HOB flat  Transfers Overall transfer level: Modified independent Equipment used: None             General transfer comment: touches furniture when ambulating    Balance Overall balance assessment: Needs assistance   Sitting balance-Leahy Scale: Good       Standing balance-Leahy Scale: Fair                             ADL either performed or assessed with clinical judgement   ADL       Grooming: Modified independent           Upper Body Dressing : Set up;Sitting   Lower Body Dressing: Modified independent;Sit to/from stand   Toilet Transfer: Modified Independent;Ambulation;Regular Toilet   Toileting- Clothing Manipulation and Hygiene: Modified independent;Sit to/from stand       Functional mobility during ADLs: Modified independent(touches furniture in room) General ADL Comments: pt has been routinely taking  herself to the bathroom      Vision       Perception     Praxis      Cognition Arousal/Alertness: Awake/alert Behavior During Therapy: WFL for tasks assessed/performed Overall Cognitive Status: Within Functional Limits for tasks assessed                                 General Comments: intact for activities completed today        Exercises     Shoulder Instructions       General Comments      Pertinent Vitals/ Pain       Pain Assessment: No/denies pain  Home Living                                          Prior Functioning/Environment              Frequency  Min 2X/week        Progress Toward Goals  OT Goals(current goals can now be found in the care plan section)  Progress towards OT goals: Progressing toward goals  Acute Rehab OT Goals Patient Stated Goal:  to go to daughter's home until she is stronger OT Goal Formulation: With patient Time For Goal Achievement: 10/09/18 Potential to Achieve Goals: Good  Plan Discharge plan remains appropriate    Co-evaluation                 AM-PAC OT "6 Clicks" Daily Activity     Outcome Measure   Help from another person eating meals?: None Help from another person taking care of personal grooming?: None Help from another person toileting, which includes using toliet, bedpan, or urinal?: None Help from another person bathing (including washing, rinsing, drying)?: A Little Help from another person to put on and taking off regular upper body clothing?: None Help from another person to put on and taking off regular lower body clothing?: None 6 Click Score: 23    End of Session Equipment Utilized During Treatment: Oxygen  OT Visit Diagnosis: Unsteadiness on feet (R26.81);Muscle weakness (generalized) (M62.81)   Activity Tolerance Patient tolerated treatment well   Patient Left in bed;with call bell/phone within reach   Nurse Communication          Time:  5465-0354 OT Time Calculation (min): 19 min  Charges: OT General Charges $OT Visit: 1 Visit OT Treatments $Self Care/Home Management : 8-22 mins  Martie Round, OTR/L Acute Rehabilitation Services Pager: [phone] Office: [phone]   Evern Bio 10/01/2018, 2:43 PM

## 2018-10-01 NOTE — Clinical Social Work Note (Signed)
Went by room to discuss SNF placement but patient off the unit. Will try again later.  Charlynn Court, CSW [phone]

## 2018-10-01 NOTE — Clinical Social Work Placement (Signed)
   CLINICAL SOCIAL WORK PLACEMENT  NOTE  Date:  10/01/2018  Patient Details  Name: [name] MRN: [id number] Date of Birth: 09-18-1933  Clinical Social Work is seeking post-discharge placement for this patient at the Skilled  Nursing Facility level of care (*CSW will initial, date and re-position this form in  chart as items are completed):      Patient/family provided with [facility] Clinical Social Work Department's list of facilities offering this level of care within the geographic area requested by the patient (or if unable, by the patient's family).      Patient/family informed of their freedom to choose among providers that offer the needed level of care, that participate in Medicare, Medicaid or managed care program needed by the patient, have an available bed and are willing to accept the patient.      Patient/family informed of [place]'s ownership interest in [facility] and [facility], as well as of the fact that they are under no obligation to receive care at these facilities.  PASRR submitted to EDS on 10/01/18     PASRR number received on       Existing PASRR number confirmed on       FL2 transmitted to all facilities in geographic area requested by pt/family on 10/01/18     FL2 transmitted to all facilities within larger geographic area on       Patient informed that his/her managed care company has contracts with or will negotiate with certain facilities, including the following:            Patient/family informed of bed offers received.  Patient chooses bed at       Physician recommends and patient chooses bed at      Patient to be transferred to   on  .  Patient to be transferred to facility by       Patient family notified on   of transfer.  Name of family member notified:        PHYSICIAN Please sign FL2     Additional Comment:    _______________________________________________ Margarito Liner, LCSW 10/01/2018, 3:19 PM

## 2018-10-01 NOTE — Consult Note (Signed)
   [facility] CM Inpatient Consult   10/01/2018  [name] 1934-02-14 [id number]  Patient assessed for medium  risk score and a 30 days or less readmission and to check if potential [facility] Care Management services for community follow up.  Chart review reveals per MD notes the patient, [name] is an 85 y.o. female with past medical history significant for COPD on 2 L nasal cannula oxygen, NASH/liver cirrhosis, chronic diastolic CHF, HTN, HLD, hypothyroidism, depression, and OSA not on CPAP; who presented with complaints of shortness of breath. Patient was admitted for acute on chronic respiratory failure with hypoxia secondary to massive ascites, fluid overload and chronic diastolic heart failure with a 20 pound weight gain in one month per MD notes. PT notes reveals patient is being recommended for skilled nursing facility stay for decondition with very limited endurance after desaturation.  Patient has [employer] and would need authorization.  Will follow up with inpatient care management team.  Discussed in morning progression meeting.  Patient is currently resting had paracentesis this morning.  For questions, please contact:  Charlesetta Shanks, RN BSN CCM [facility]  [phone] business mobile phone Toll free office [phone]

## 2018-10-01 NOTE — NC FL2 (Signed)
[place] MEDICAID FL2 LEVEL OF CARE SCREENING TOOL     IDENTIFICATION  Patient Name: Madison DakinVeta A Derossett Birthdate: 05/15/1934 Sex: female Admission Date (Current Location): 09/26/2018  [facility] [place]   Facility and Address:  [facility], [address], [place], [place] [address]      Provider Number: [id number]  Attending Physician Name and Address:  Rolly SalterPatel, Pranav M, MD  Relative Name and Phone Number:       Current Level of Care: Hospital Recommended Level of Care: Skilled Nursing Facility Prior Approval Number:    Date Approved/Denied:   PASRR Number: Manual review  Discharge Plan: SNF    Current Diagnoses: Patient Active Problem List   Diagnosis Date Noted  . Pancytopenia (HCC) 09/26/2018  . COPD exacerbation (HCC) 09/26/2018  . Acute on chronic diastolic CHF (congestive heart failure) (HCC) 09/26/2018  . Acute on chronic respiratory failure with hypoxia (HCC) 09/26/2018  . PNA (pneumonia) 09/15/2018  . Microalbuminuria due to type 2 diabetes mellitus (HCC) 08/26/2018  . Hypocalcemia 03/12/2018  . Gastroesophageal reflux disease 12/31/2017  . chronic Low platelet count 06/05/2017  . Hypertension associated with diabetes (HCC) 05/22/2017  . Morbidly obese (HCC)- BMI >er 35 w DM 06/23/2016  . chronic Leukopenia 05/11/2016  . Vitamin D deficiency 05/11/2016  . Elevated serum alkaline phosphatase level- hepatocellular in origin 05/11/2016  . Depression 03/13/2016  . Hypothyroidism 03/13/2016  . Insulin dependent diabetes mellitus with complications (HCC) 03/13/2016  . Mixed diabetic hyperlipidemia associated with type 1 diabetes mellitus (HCC) 03/13/2016  . Constipation, chronic 03/13/2016  . At high risk for osteoporosis 03/13/2016  . Hypotension 12/13/2012  . Hypokalemia 12/13/2012  . Thrombocytopenia, unspecified (HCC) 12/13/2012  . h/o Clostridium difficile colitis 12/12/2012  . History of GI bleed 12/12/2012  .  ILD (interstitial lung disease) (HCC) 11/25/2012  . Coronary artery disease 04/11/2011  . COPD (chronic obstructive pulmonary disease) (HCC) 04/11/2011  . Obstructive sleep apnea 04/11/2011  . Heart failure, diastolic, chronic (HCC) 03/29/2008  . Atherosclerosis of carotid arteries 04/03/2006    Orientation RESPIRATION BLADDER Height & Weight     Self, Time, Place, Situation  O2(Nasal Canula 2 L) Continent Weight: 190 lb 11.2 oz (86.5 kg) Height:  5' (152.4 cm)  BEHAVIORAL SYMPTOMS/MOOD NEUROLOGICAL BOWEL NUTRITION STATUS  (None) (None) Continent Diet(Heart healthy/carb modified. Fluid restriction 1500 mL.)  AMBULATORY STATUS COMMUNICATION OF NEEDS Skin   Limited Assist Verbally Bruising                       Personal Care Assistance Level of Assistance  Bathing, Feeding, Dressing Bathing Assistance: Independent(Modified) Feeding assistance: Independent(Modified) Dressing Assistance: Independent(Modified)     Functional Limitations Info  Sight, Hearing, Speech Sight Info: Adequate Hearing Info: Adequate Speech Info: Adequate    SPECIAL CARE FACTORS FREQUENCY  PT (By licensed PT)     PT Frequency: 5 x week              Contractures Contractures Info: Not present    Additional Factors Info  Code Status, Allergies, Psychotropic Code Status Info: Full code Allergies Info: Ace Inhibitors, Benadryl (Diphenhydramine Hcl (sleep)), Penicillins, Sulfonamide Derivatives. Psychotropic Info: Depression: Lexapro 20 mg PO daily.         Current Medications (10/01/2018):  This is the current hospital active medication list Current Facility-Administered Medications  Medication Dose Route Frequency Provider Last Rate Last Dose  . 0.9 %  sodium chloride infusion  250 mL  Intravenous PRN Lorretta HarpNiu, Xilin, MD      . acetaminophen (TYLENOL) tablet 650 mg  650 mg Oral Q4H PRN Lorretta HarpNiu, Xilin, MD   650 mg at 09/28/18 1115  . artificial tears (LACRILUBE) ophthalmic ointment 1 application  1  application Both Eyes QHS PRN Lorretta HarpNiu, Xilin, MD      . carvedilol (COREG) tablet 3.125 mg  3.125 mg Oral BID WC Lorretta HarpNiu, Xilin, MD   3.125 mg at 10/01/18 1018  . cholecalciferol (VITAMIN D3) tablet 1,000 Units  1,000 Units Oral Daily Lorretta HarpNiu, Xilin, MD   1,000 Units at 10/01/18 1017  . dextromethorphan-guaiFENesin (MUCINEX DM) 30-600 MG per 12 hr tablet 1 tablet  1 tablet Oral BID PRN Lorretta HarpNiu, Xilin, MD      . escitalopram (LEXAPRO) tablet 20 mg  20 mg Oral Daily Lorretta HarpNiu, Xilin, MD   20 mg at 10/01/18 1016  . furosemide (LASIX) injection 40 mg  40 mg Intravenous BID Zannie CoveJoseph, Preetha, MD   40 mg at 10/01/18 1017  . insulin aspart (novoLOG) injection 0-20 Units  0-20 Units Subcutaneous TID WC Zannie CoveJoseph, Preetha, MD   7 Units at 10/01/18 1232  . insulin aspart (novoLOG) injection 5 Units  5 Units Subcutaneous TID WC Zannie CoveJoseph, Preetha, MD   5 Units at 10/01/18 1232  . insulin glargine (LANTUS) injection 35 Units  35 Units Subcutaneous BID Zannie CoveJoseph, Preetha, MD   35 Units at 10/01/18 1018  . ipratropium (ATROVENT) nebulizer solution 0.5 mg  0.5 mg Nebulization BID Lorretta HarpNiu, Xilin, MD   0.5 mg at 10/01/18 0824  . levalbuterol (XOPENEX) nebulizer solution 1.25 mg  1.25 mg Nebulization BID Lorretta HarpNiu, Xilin, MD   1.25 mg at 10/01/18 0824  . levothyroxine (SYNTHROID, LEVOTHROID) tablet 75 mcg  75 mcg Oral Daily Lorretta HarpNiu, Xilin, MD   75 mcg at 10/01/18 0521  . lidocaine (XYLOCAINE) 1 % (with pres) injection           . loratadine (CLARITIN) tablet 10 mg  10 mg Oral Daily Zannie CoveJoseph, Preetha, MD   10 mg at 10/01/18 1017  . mometasone-formoterol (DULERA) 200-5 MCG/ACT inhaler 2 puff  2 puff Inhalation BID Lorretta HarpNiu, Xilin, MD   2 puff at 10/01/18 [phone]  . montelukast (SINGULAIR) tablet 10 mg  10 mg Oral Daily Lorretta HarpNiu, Xilin, MD   10 mg at 10/01/18 1017  . multivitamin (PROSIGHT) tablet 1 tablet  1 tablet Oral Daily Lorretta HarpNiu, Xilin, MD   1 tablet at 10/01/18 1017  . nitroGLYCERIN (NITROSTAT) SL tablet 0.4 mg  0.4 mg Sublingual Q5 min PRN Lorretta HarpNiu, Xilin, MD      . ondansetron  [facility](ZOFRAN) injection 4 mg  4 mg Intravenous Q6H PRN Lorretta HarpNiu, Xilin, MD      . pantoprazole (PROTONIX) EC tablet 20 mg  20 mg Oral Daily Lorretta HarpNiu, Xilin, MD   20 mg at 10/01/18 1017  . polyethylene glycol (MIRALAX / GLYCOLAX) packet 17 g  17 g Oral Daily PRN Webb SilversmithSmith, Laura Easterwood, NP      . polyvinyl alcohol (LIQUIFILM TEARS) 1.4 % ophthalmic solution 1 drop  1 drop Both Eyes QID Lorretta HarpNiu, Xilin, MD   1 drop at 10/01/18 1308  . potassium chloride SA (K-DUR,KLOR-CON) CR tablet 40 mEq  40 mEq Oral Daily Zannie CoveJoseph, Preetha, MD   40 mEq at 10/01/18 1017  . sodium chloride flush (NS) 0.9 % injection 3 mL  3 mL Intravenous Q12H Lorretta HarpNiu, Xilin, MD   3 mL at 10/01/18 1019  . sodium chloride flush (NS) 0.9 % injection 3 mL  3 mL Intravenous PRN Lorretta Harp, MD         Discharge Medications: Please see discharge summary for a list of discharge medications.  Relevant Imaging Results:  Relevant Lab Results:   Additional Information SS#: [id number]  Margarito Liner, LCSW

## 2018-10-01 NOTE — Clinical Social Work Note (Signed)
Clinical Social Work Assessment  Patient Details  Name: [name] MRN: [id number] Date of Birth: Jan 11, 1934  Date of referral:  10/01/18               Reason for consult:  Facility Placement, Discharge Planning                Permission sought to share information with:  Facility Sport and exercise psychologist, Family Supports Permission granted to share information::  Yes, Verbal Permission Granted  Name::     [name]  Agency::  SNF's  Relationship::  Daughter  Contact Information:  [phone] or [phone] (work)  Housing/Transportation Living arrangements for the past 2 months:  [place] of Information:  Patient, Medical Team, Adult Children Patient Interpreter Needed:  None Criminal Activity/Legal Involvement Pertinent to Current Situation/Hospitalization:  No - Comment as needed Significant Relationships:  Adult Children Lives with:  Self Do you feel safe going back to the place where you live?  Yes Need for family participation in patient care:  Yes (Comment)  Care giving concerns:  PT recommending SNF once medically stable for discharge.   Social Worker assessment / plan:  CSW met with patient. No supports at bedside. CSW introduced role and explained that PT recommendations would be discussed. Patient agreeable to SNF placement and gave CSW permission to notify her daughter [name]. [name] was pleased that patient had agreed to SNF placement. First preference is [facility] and second is [facility]. [facility] is able to offer a bed and MD stated that anticipated discharge date is tomorrow or Friday. CSW started insurance authorization. PASARR under manual review. Family will transport patient to SNF by car. Oxygen tank is in her room. No further concerns. CSW encouraged patient and her daughter to contact CSW as needed. CSW will continue to follow patient and her daughter for support and facilitate discharge to SNF once medically  stable.  Employment status:  Retired Forensic scientist:  Other (Comment Required)([employer]) PT Recommendations:  [place] / Referral to community resources:  [place]  Patient/Family's Response to care:  Patient and her daughter agreeable to SNF placement. Patient's children supportive and involved in patient's care. Patient and her daughter appreciated social work intervention.  Patient/Family's Understanding of and Emotional Response to Diagnosis, Current Treatment, and Prognosis:  Patient and her daughter have a good understanding of the reason for admission and her need for rehab prior to returning home. Patient and her daughter appear happy with hospital care.  Emotional Assessment Appearance:  Appears stated age Attitude/Demeanor/Rapport:  Engaged, Gracious Affect (typically observed):  Accepting, Appropriate, Calm, Pleasant Orientation:  Oriented to Self, Oriented to Place, Oriented to  Time, Oriented to Situation Alcohol / Substance use:  Never Used Psych involvement (Current and /or in the community):  No (Comment)  Discharge Needs  Concerns to be addressed:  Care Coordination Readmission within the last 30 days:  Yes Current discharge risk:  Dependent with Mobility, Lives alone Barriers to Discharge:  Continued Medical Work up, [place], LCSW 10/01/2018, 3:14 PM

## 2018-10-01 NOTE — Progress Notes (Signed)
PROCEDURE SUMMARY:  Successful US guided paracentesis from left lateral abdomen.  Yielded 1.3 liters of amber fluid.  No immediate complications.  Pt tolerated well.   Specimen was not sent for labs.  EBL < 69mL  Hoyt Koch PA-C 10/01/2018 9:45 AM

## 2018-10-02 ENCOUNTER — Other Ambulatory Visit: Payer: Self-pay

## 2018-10-02 ENCOUNTER — Ambulatory Visit: Payer: Self-pay

## 2018-10-02 LAB — CBC WITH DIFFERENTIAL/PLATELET
Abs Immature Granulocytes: 0.01 10*3/uL (ref 0.00–0.07)
Basophils Absolute: 0 10*3/uL (ref 0.0–0.1)
Basophils Relative: 0 %
Eosinophils Absolute: 0.1 10*3/uL (ref 0.0–0.5)
Eosinophils Relative: 4 %
HCT: 29.9 % — ABNORMAL LOW (ref 36.0–46.0)
Hemoglobin: 9.1 g/dL — ABNORMAL LOW (ref 12.0–15.0)
Immature Granulocytes: 0 %
Lymphocytes Relative: 16 %
Lymphs Abs: 0.4 10*3/uL — ABNORMAL LOW (ref 0.7–4.0)
MCH: 25.1 pg — ABNORMAL LOW (ref 26.0–34.0)
MCHC: 30.4 g/dL (ref 30.0–36.0)
MCV: 82.6 fL (ref 80.0–100.0)
Monocytes Absolute: 0.5 10*3/uL (ref 0.1–1.0)
Monocytes Relative: 18 %
Neutro Abs: 1.6 10*3/uL — ABNORMAL LOW (ref 1.7–7.7)
Neutrophils Relative %: 62 %
Platelets: 32 10*3/uL — ABNORMAL LOW (ref 150–400)
RBC: 3.62 MIL/uL — ABNORMAL LOW (ref 3.87–5.11)
RDW: 17.5 % — ABNORMAL HIGH (ref 11.5–15.5)
WBC: 2.6 10*3/uL — ABNORMAL LOW (ref 4.0–10.5)
nRBC: 0 % (ref 0.0–0.2)

## 2018-10-02 LAB — COMPREHENSIVE METABOLIC PANEL
ALT: 17 U/L (ref 0–44)
AST: 21 U/L (ref 15–41)
Albumin: 3.5 g/dL (ref 3.5–5.0)
Alkaline Phosphatase: 87 U/L (ref 38–126)
Anion gap: 10 (ref 5–15)
BUN: 15 mg/dL (ref 8–23)
CO2: 28 mmol/L (ref 22–32)
Calcium: 8.5 mg/dL — ABNORMAL LOW (ref 8.9–10.3)
Chloride: 102 mmol/L (ref 98–111)
Creatinine, Ser: 0.83 mg/dL (ref 0.44–1.00)
GFR calc Af Amer: >60 mL/min (ref >60)
GFR calc non Af Amer: >60 mL/min (ref >60)
Glucose, Bld: 88 mg/dL (ref 70–99)
Potassium: 4 mmol/L (ref 3.5–5.1)
Sodium: 140 mmol/L (ref 135–145)
Total Bilirubin: 1.4 mg/dL — ABNORMAL HIGH (ref 0.3–1.2)
Total Protein: 5.9 g/dL — ABNORMAL LOW (ref 6.5–8.1)

## 2018-10-02 LAB — GLUCOSE, CAPILLARY
Glucose-Capillary: 77 mg/dL (ref 70–99)
Glucose-Capillary: 163 mg/dL — ABNORMAL HIGH (ref 70–99)
Glucose-Capillary: 219 mg/dL — ABNORMAL HIGH (ref 70–99)
Glucose-Capillary: 118 mg/dL — ABNORMAL HIGH (ref 70–99)

## 2018-10-02 LAB — MAGNESIUM: Magnesium: 2.1 mg/dL (ref 1.7–2.4)

## 2018-10-02 MED ORDER — FUROSEMIDE 80 MG PO TABS
80.0000 mg | ORAL_TABLET | Freq: Two times a day (BID) | ORAL | Status: DC
  Administered 2018-10-02 – 2018-10-03 (×2): 80 mg via ORAL
  Filled 2018-10-02 (×2): qty 1

## 2018-10-02 MED ORDER — FLUCONAZOLE 200 MG PO TABS
200.0000 mg | ORAL_TABLET | Freq: Once | ORAL | Status: AC
  Administered 2018-10-02: 200 mg via ORAL
  Filled 2018-10-02: qty 1

## 2018-10-02 MED ORDER — SPIRONOLACTONE 25 MG PO TABS
50.0000 mg | ORAL_TABLET | Freq: Every day | ORAL | Status: DC
  Administered 2018-10-02 – 2018-10-03 (×2): 50 mg via ORAL
  Filled 2018-10-02 (×2): qty 2

## 2018-10-02 NOTE — Patient Outreach (Signed)
[facility]) Care Management  10/02/2018  [name] [birth year] [id number]  [name] is going to a SNF after discharge.  Will close her [facility] case.   Berlin Hun, PharmD Clinical Pharmacist [facility] [phone]

## 2018-10-02 NOTE — Clinical Social Work Note (Addendum)
Uploaded requested documents into [place] Must for PASARR review.  Charlynn Court, CSW [phone]  9:27 am PASARR obtained: [id number]. Expires 11/01/2018. Insurance authorization still pending. Left voicemail for SNF admissions coordinator to notify.  Charlynn Court, CSW [phone]  1:50 pm Called and updated daughter Sue Lush.  Charlynn Court, CSW [phone]

## 2018-10-02 NOTE — Progress Notes (Signed)
Progress Note    [name]  CXK:[id number] DOB: Aug 27, 1933  DOA: 09/26/2018 PCP: Mellody Dance, DO    Brief Narrative:   Chief complaint: Shortness of breath  Medical records reviewed and are as summarized below:  [name] is an 85 y.o. female with past medical history significant for COPD on 2 L nasal cannula oxygen, NASH/liver cirrhosis, chronic diastolic CHF, HTN, HLD, hypothyroidism, depression, and OSA not on CPAP; who presented with complaints of shortness of breath.  Subjective:   Has abdominal distention but improvement.  No nausea no vomiting.  No fever no chills.  No diarrhea.  Assessment/Plan:   Acute on chronic respiratory failure with hypoxia -I suspect this is secondary to massive ascites, fluid overload, acute on chronic diastolic CHF in the background of cirrhosis/[place]  -20 pound weight gain in 1 month, she weighed 181lbs on 1/7 and weighed 203lbs on 2/9 -Ultrasound guided paracentesis done on 09/29/2018 and 10/01/2018. fluid is consistent with portal hypertension from cirrhosis,  -She was given IV albumin -Treated with IV Lasix, will transition to oral Lasix and Aldactone combination -PT eval, wean O2 as tolerated  Massive ascites -Secondary to NASH/cirrhosis -Underwent paracentesis 2/10, 2 L drained, needs another 3 to 4 L drained requested another round of paracentesis, given IV albumin yesterday -Will need gastroenterology follow-up post discharge, and fluid restriction along with Lasix and Aldactone, seen by [facility] in the past  Acute on chronic diastolic CHF -As above  COPD/chronic respiratory failure -On 2 L home O2 at baseline, do not suspect acute exacerbation, continue nebs, discontinue steroids  Uncontrolled diabetes mellitus with no significant complication. -Hemoglobin A1c was 9 recently, Continue Lantus and meal coverage NovoLog, sliding scale insulin, CBGs much better  Anemia/thrombocytopenia: Acute on chronic.   -Due to  cirrhosis, splenic sequestration  -Monitor  ?  Atrial arrhythmia/SVT -Resolved, continue Coreg -In sinus rhythm at this time -Echocardiogram with EF of 45%, slightly lower than baseline and chronic inferior wall motion abnormality -Due to chronic thrombocytopenia, liver cirrhosis, history of GI bleeds would not be appropriate for full dose anticoagulation therefore will continue medical management and outpatient referral with cardiology  Hypothyroidism: Last TSH noted to be 0.281 on 08/26/2018.  Patient was decreased from 100 mcg to 75 mcg daily on admission. -Repeat TSH in 4 to 6 weeks.  CAD: Patient did not report any significant complaints of chest pain.  Previous history of inferior wall MI and stenting.  Patient not on aspirin due to history of thrombocytopenia and GI bleeds.  GERD -Continue Protonix -Follow-up repeat EKG in a.m.  Obesity: Body mass index is 35.76 kg/m.   Family Communication/Anticipated D/C date and plan/Code Status   DVT prophylaxis: SCDs Code Status: Full Code.  Family Communication: Daughter at bedside Disposition Plan: Home likely in 48 hours if stable   Medical Consultants:    None.   Anti-Infectives:    None  Objective:    Vitals:   10/02/18 0852 10/02/18 0900 10/02/18 1200 10/02/18 1336  BP:   (!) 126/47 (!) 109/45  Pulse:  67 62 65  Resp:      Temp:   98 F (36.7 C) 98.1 F (36.7 C)  TempSrc:   Oral Oral  SpO2: 100%  100% 100%  Weight:      Height:        Intake/Output Summary (Last 24 hours) at 10/02/2018 1806 Last data filed at 10/02/2018 1200 Gross per 24 hour  Intake 360 ml  Output 1440  ml  Net -1080 ml   Filed Weights   09/30/18 0327 10/01/18 0558 10/02/18 0543  Weight: 91 kg 86.5 kg 83.1 kg    Exam: Gen: Awake, Alert, Oriented X 3, chronically ill-appearing, no distress HEENT: PERRLA, Neck supple, no JVD Lungs: Bibasilar crackles CVS: RRR,No Gallops,Rubs or new Murmurs Abd: Soft, nontender, distended, positive  fluid thrill Extremities: 1+ edema Skin: no new rashes   Data Reviewed:   I have personally reviewed following labs and imaging studies:  Labs: Labs show the following:   Basic Metabolic Panel: Recent Labs  Lab 09/28/18 0519 09/28/18 0931 09/29/18 0603 09/30/18 0506 10/01/18 0426 10/02/18 0453  NA 133* 131* 137 139 138 140  K 4.3 4.3 3.9 3.8 3.8 4.0  CL 95* 91* 94* 99 99 102  CO2 27 29 30 31 31 28   GLUCOSE 340* 341* 204* 79 92 88  BUN 28* 27* 26* 18 15 15   CREATININE 1.15* 1.09* 0.93 0.83 0.77 0.83  CALCIUM 8.3* 8.5* 8.3* 8.7* 8.5* 8.5*  MG 1.9  --   --   --   --  2.1   GFR Estimated Creatinine Clearance: 49.9 mL/min (by C-G formula based on SCr of 0.83 mg/dL). Liver Function Tests: Recent Labs  Lab 09/26/18 1518 09/28/18 0931 09/29/18 0603 10/02/18 0453  AST 30 33 19 21  ALT 28 25 21 17   ALKPHOS 116 117 92 87  BILITOT 1.0 1.2 0.7 1.4*  PROT 6.1* 6.3* 6.0* 5.9*  ALBUMIN 2.7* 3.0* 3.2* 3.5   No results for input(s): LIPASE, AMYLASE in the last 168 hours. No results for input(s): AMMONIA in the last 168 hours. Coagulation profile Recent Labs  Lab 09/27/18 0226  INR 1.10    CBC: Recent Labs  Lab 09/26/18 1518 09/27/18 0949 09/29/18 0603 09/30/18 0506 10/01/18 0426 10/02/18 0453  WBC 3.7* 3.6* 5.3 4.2 2.9* 2.6*  NEUTROABS 2.5 3.3  --   --   --  1.6*  HGB 9.8* 10.4* 8.9* 8.5* 8.6* 9.1*  HCT 32.5* 34.5* 28.4* 27.8* 28.9* 29.9*  MCV 84.0 82.3 81.6 82.2 82.1 82.6  PLT 50* 46* 37* 32* 30* 32*   Cardiac Enzymes: Recent Labs  Lab 09/26/18 1518 09/27/18 0226 09/27/18 0949 09/27/18 1551 09/28/18 0519  TROPONINI <0.03 <0.03 <0.03 <0.03 <0.03   BNP (last 3 results) No results for input(s): PROBNP in the last 8760 hours. CBG: Recent Labs  Lab 10/01/18 1649 10/01/18 2112 10/02/18 0753 10/02/18 1201 10/02/18 1544  GLUCAP 137* 145* 77 163* 219*   D-Dimer: No results for input(s): DDIMER in the last 72 hours. Hgb A1c: No results for  input(s): HGBA1C in the last 72 hours. Lipid Profile: No results for input(s): CHOL, HDL, LDLCALC, TRIG, CHOLHDL, LDLDIRECT in the last 72 hours. Thyroid function studies: No results for input(s): TSH, T4TOTAL, T3FREE, THYROIDAB in the last 72 hours.  Invalid input(s): FREET3 Anemia work up: No results for input(s): VITAMINB12, FOLATE, FERRITIN, TIBC, IRON, RETICCTPCT in the last 72 hours. Sepsis Labs: Recent Labs  Lab 09/29/18 0603 09/30/18 0506 10/01/18 0426 10/02/18 0453  WBC 5.3 4.2 2.9* 2.6*    Microbiology Recent Results (from the past 240 hour(s))  Culture, body fluid-bottle     Status: None (Preliminary result)   Collection Time: 09/29/18  2:29 PM  Result Value Ref Range Status   Specimen Description PERITONEAL  Final   Special Requests NONE  Final   Culture   Final    NO GROWTH 3 DAYS Performed at [facility]  Lab, [address], [place], [place] [address]    Report Status PENDING  Incomplete  Gram stain     Status: None   Collection Time: 09/29/18  2:29 PM  Result Value Ref Range Status   Specimen Description PERITONEAL  Final   Special Requests NONE  Final   Gram Stain   Final    WBC PRESENT, PREDOMINANTLY MONONUCLEAR NO ORGANISMS SEEN CYTOSPIN SMEAR Performed at [facility], [place] [address] [place], [place] [address]    Report Status 09/29/2018 FINAL  Final    Procedures and diagnostic studies:  Echocardiogram 09/27/2018: Impression as seen below  1. The left ventricle has mild-moderately reduced systolic function of 47-42%. The cavity size was normal. There is no increased left ventricular wall thickness. Echo evidence of pseudonormalization in diastolic relaxation Elevated left ventricular  end-diastolic pressure.  2. There is akinesis of the entire inferior left ventricular segment.  3. The right ventricle has normal systolic function. The cavity was normal. There is no increase in right ventricular wall thickness. Right ventricular systolic  pressure could not be assessed.  4. The mitral valve is normal in structure. There is mild thickening and moderate calcification.  5. The tricuspid valve is normal in structure.  6. The aortic valve is normal in structure. There is mild thickening and mild calcification of the aortic valve.  Ir Paracentesis  Result Date: 10/01/2018 INDICATION: Patient with history of abdominal distention, ascites. Request is made for therapeutic paracentesis of up to 4 L maximum. EXAM: ULTRASOUND GUIDED THERAPEUTIC PARACENTESIS MEDICATIONS: 10 mL 1% lidocaine COMPLICATIONS: None immediate. PROCEDURE: Informed written consent was obtained from the patient after a discussion of the risks, benefits and alternatives to treatment. A timeout was performed prior to the initiation of the procedure. Initial ultrasound scanning demonstrates a small amount of ascites within the left lateral abdomen. The right lower abdomen was prepped and draped in the usual sterile fashion. 1% lidocaine was used for local anesthesia. Following this, a Safe-T-Centesis catheter was introduced. An ultrasound image was saved for documentation purposes. The paracentesis was performed. The catheter was removed and a dressing was applied. The patient tolerated the procedure well without immediate post procedural complication. FINDINGS: A total of approximately 1.3 L of amber fluid was removed. IMPRESSION: Successful ultrasound-guided therapeutic paracentesis yielding 1.3 liters of peritoneal fluid. Read by: Brynda Greathouse PA-C Electronically Signed   By: Jacqulynn Cadet M.D.   On: 10/01/2018 09:47    Medications:   . carvedilol  3.125 mg Oral BID WC  . cholecalciferol  1,000 Units Oral Daily  . escitalopram  20 mg Oral Daily  . furosemide  80 mg Oral BID  . insulin aspart  0-20 Units Subcutaneous TID WC  . insulin aspart  5 Units Subcutaneous TID WC  . insulin glargine  35 Units Subcutaneous BID  . ipratropium  0.5 mg Nebulization BID  .  levalbuterol  1.25 mg Nebulization BID  . levothyroxine  75 mcg Oral Daily  . loratadine  10 mg Oral Daily  . mometasone-formoterol  2 puff Inhalation BID  . montelukast  10 mg Oral Daily  . multivitamin  1 tablet Oral Daily  . pantoprazole  20 mg Oral Daily  . polyvinyl alcohol  1 drop Both Eyes QID  . sodium chloride flush  3 mL Intravenous Q12H  . spironolactone  50 mg Oral Daily   Continuous Infusions: . sodium chloride       LOS: 5 days   Author:  Berle Mull, MD [facility] 10/02/2018   To reach On-call, see care teams to locate the attending and reach out to them via [email or web address]. If 7PM-7AM, please contact night-coverage If you still have difficulty reaching the attending provider, please page the [facility] (Director on Call) for [facility] on amion for assistance.

## 2018-10-03 LAB — MAGNESIUM: Magnesium: 2.1 mg/dL (ref 1.7–2.4)

## 2018-10-03 LAB — BASIC METABOLIC PANEL
Anion gap: 11 (ref 5–15)
BUN: 13 mg/dL (ref 8–23)
CO2: 28 mmol/L (ref 22–32)
Calcium: 9.4 mg/dL (ref 8.9–10.3)
Chloride: 100 mmol/L (ref 98–111)
Creatinine, Ser: 0.9 mg/dL (ref 0.44–1.00)
GFR calc Af Amer: >60 mL/min (ref >60)
GFR calc non Af Amer: 60 mL/min — ABNORMAL LOW (ref >60)
Glucose, Bld: 87 mg/dL (ref 70–99)
Potassium: 4.3 mmol/L (ref 3.5–5.1)
Sodium: 139 mmol/L (ref 135–145)

## 2018-10-03 LAB — GLUCOSE, CAPILLARY
Glucose-Capillary: 73 mg/dL (ref 70–99)
Glucose-Capillary: 210 mg/dL — ABNORMAL HIGH (ref 70–99)

## 2018-10-03 MED ORDER — FUROSEMIDE 40 MG PO TABS: ORAL_TABLET | ORAL | 0 refills | Status: DC

## 2018-10-03 MED ORDER — SPIRONOLACTONE 25 MG PO TABS: 50.0000 mg | ORAL_TABLET | Freq: Every day | ORAL | 0 refills | Status: DC

## 2018-10-03 MED ORDER — INSULIN GLARGINE 100 UNIT/ML SOLOSTAR PEN: 35.0000 [IU] | PEN_INJECTOR | Freq: Two times a day (BID) | SUBCUTANEOUS | 0 refills | Status: DC

## 2018-10-03 MED ORDER — FUROSEMIDE 80 MG PO TABS: 80.0000 mg | ORAL_TABLET | Freq: Every day | ORAL | Status: DC

## 2018-10-03 MED ORDER — LEVALBUTEROL HCL 1.25 MG/0.5ML IN NEBU: 1.2500 mg | INHALATION_SOLUTION | Freq: Four times a day (QID) | RESPIRATORY_TRACT | Status: DC | PRN

## 2018-10-03 NOTE — Care Management Note (Signed)
Case Management Note  Patient Details  Name: DOMNIQUE RAYSOR MRN: [id number] Date of Birth: 1934/03/24  Action/Plan: CM talked to patient about DCP; possibly home with [facility] if SNF is not approved by private insurance; pt/ family is agreeable to [facility]; Dan with [facility] called for arrangements. Patient will be staying with her daughter Efraim Kaufmann [address], [place] [address] Cell- [phone] DME - home oxygen through [employer], walker, cane and bedside commode at home.  Expected Discharge Date:   10/03/2018               Expected Discharge Plan:  Home w Home Health Services  Discharge planning Services  CM Consult     Choice offered to:  Patient, Adult Children  HH Arranged:  RN, Disease Management, PT HH Agency:  [facility]  Status of Service:  In process, will continue to follow  Reola Mosher [phone] 10/03/2018, 12:40 PM

## 2018-10-03 NOTE — Discharge Instructions (Signed)
Ascites    Ascites is a collection of too much fluid in the abdomen. Ascites can range from mild to severe. If ascites is not treated, it can get worse.  What are the causes?  This condition may be caused by:   A liver condition called cirrhosis. This is the most common cause of ascites.   Long-term (chronic) or alcoholic hepatitis.   Infection or inflammation in the abdomen.   Cancer in the abdomen.   Heart failure.   Kidney disease.   Inflammation of the pancreas.   Clots in the veins of the liver.  What are the signs or symptoms?  Symptoms of this condition include:   A feeling of fullness in the abdomen. This is common.   An increase in the size of the abdomen or waist.   Swelling in the legs.   Swelling of the scrotum (in men).   Difficulty breathing.   Pain in the abdomen.   Sudden weight gain.  If the condition is mild, you may not have symptoms.  How is this diagnosed?  This condition is diagnosed based on your medical history and a physical exam. Your health care provider may order imaging tests, such as an ultrasound or CT scan of your abdomen.  How is this treated?  Treatment for this condition depends on the cause of the ascites. It may include:   Taking a pill to make you urinate. This is called a water pill (diuretic pill).   Strictly reducing your salt (sodium) intake. Salt can cause extra fluid to be kept (retained) in the body, and this makes ascites worse.   Having a procedure to remove fluid from your abdomen (paracentesis).   Having a procedure that connects two of the major veins within your liver and relieves pressure on your liver. This is called a TIPS procedure (transjugular intrahepatic portosystemic shunt procedure).   Placement of a drainage catheter (peritoneovenous shunt) to manage the extra fluid in the abdomen.  Ascites may go away or improve when the condition that caused it is treated.  Follow these instructions at home:   Keep track of your weight. To do this,  weigh yourself at the same time every day and write down your weight.   Keep track of how much you drink and any changes in how much or how often you urinate.   Follow any instructions that your health care provider gives you about how much to drink.   Try not to eat salty (high-sodium) foods.   Take over-the-counter and prescription medicines only as told by your health care provider.   Keep all follow-up visits as told by your health care provider. This is important.   Report any changes in your health to your health care provider, especially if you develop new symptoms or your symptoms get worse.  Contact a health care provider if:   You gain more than 3 lb (1.36 kg) in 3 days.   Your waist size increases.   You have new swelling in your legs.   The swelling in your legs gets worse.  Get help right away if:   You have a fever.   You are confused.   You have new or worsening breathing trouble.   You have new or worsening pain in your abdomen.   You have new or worsening swelling in the scrotum (in men).  Summary   Ascites is a collection of too much fluid in the abdomen.   Ascites may be caused   by various conditions, such as cirrhosis, hepatitis, cancer, or congestive heart failure.   Symptoms may include swelling of the abdomen and other areas due to extra fluid in the body.   Treatments may involve dietary changes, medicines, or procedures.  This information is not intended to replace advice given to you by your health care provider. Make sure you discuss any questions you have with your health care provider.  Document Released: 08/06/2005 Document Revised: 04/18/2017 Document Reviewed: 04/18/2017  Elsevier Interactive Patient Education  2019 [employer]

## 2018-10-03 NOTE — Care Management Important Message (Signed)
Important Message  Patient Details  Name: [name] MRN: [id number] Date of Birth: Jun 27, 1934   Medicare Important Message Given:  Yes    Sakeena Teall P Laren Whaling 10/03/2018, 11:48 AM

## 2018-10-03 NOTE — Clinical Social Work Note (Addendum)
Left a voicemail at [facility] [employer] to check authorization status.  Charlynn Court, CSW [phone]  11:49 am [facility] [employer]'s medical director is reviewing authorization request.   Charlynn Court, CSW [phone]  3:25 pm Insurance authorization denied for SNF placement. Daughter said this morning that if she was denied, they would just go home with home health instead of pursuing peer-to-peer review. RNCM has already set up home health. MD will discharge her today.  CSW signing off.   Charlynn Court, CSW [phone]

## 2018-10-04 LAB — CULTURE, BODY FLUID W GRAM STAIN -BOTTLE: Culture: NO GROWTH

## 2018-10-06 ENCOUNTER — Telehealth: Payer: Self-pay | Admitting: Family Medicine

## 2018-10-06 ENCOUNTER — Other Ambulatory Visit: Payer: Self-pay

## 2018-10-06 ENCOUNTER — Ambulatory Visit: Payer: PPO | Admitting: Cardiology

## 2018-10-06 DIAGNOSIS — Z7951 Long term (current) use of inhaled steroids: Secondary | ICD-10-CM | POA: Diagnosis not present

## 2018-10-06 DIAGNOSIS — I2510 Atherosclerotic heart disease of native coronary artery without angina pectoris: Secondary | ICD-10-CM | POA: Diagnosis not present

## 2018-10-06 DIAGNOSIS — I110 Hypertensive heart disease with heart failure: Secondary | ICD-10-CM | POA: Diagnosis not present

## 2018-10-06 DIAGNOSIS — Z955 Presence of coronary angioplasty implant and graft: Secondary | ICD-10-CM | POA: Diagnosis not present

## 2018-10-06 DIAGNOSIS — I700 Atherosclerosis of aorta: Secondary | ICD-10-CM | POA: Diagnosis not present

## 2018-10-06 DIAGNOSIS — D6959 Other secondary thrombocytopenia: Secondary | ICD-10-CM | POA: Diagnosis not present

## 2018-10-06 DIAGNOSIS — K219 Gastro-esophageal reflux disease without esophagitis: Secondary | ICD-10-CM | POA: Diagnosis not present

## 2018-10-06 DIAGNOSIS — R2689 Other abnormalities of gait and mobility: Secondary | ICD-10-CM | POA: Diagnosis not present

## 2018-10-06 DIAGNOSIS — J9611 Chronic respiratory failure with hypoxia: Secondary | ICD-10-CM | POA: Diagnosis not present

## 2018-10-06 DIAGNOSIS — D61818 Other pancytopenia: Secondary | ICD-10-CM | POA: Diagnosis not present

## 2018-10-06 DIAGNOSIS — Z6835 Body mass index (BMI) 35.0-35.9, adult: Secondary | ICD-10-CM | POA: Diagnosis not present

## 2018-10-06 DIAGNOSIS — I5032 Chronic diastolic (congestive) heart failure: Secondary | ICD-10-CM | POA: Diagnosis not present

## 2018-10-06 DIAGNOSIS — D638 Anemia in other chronic diseases classified elsewhere: Secondary | ICD-10-CM | POA: Diagnosis not present

## 2018-10-06 DIAGNOSIS — E785 Hyperlipidemia, unspecified: Secondary | ICD-10-CM | POA: Diagnosis not present

## 2018-10-06 DIAGNOSIS — I252 Old myocardial infarction: Secondary | ICD-10-CM | POA: Diagnosis not present

## 2018-10-06 DIAGNOSIS — J849 Interstitial pulmonary disease, unspecified: Secondary | ICD-10-CM | POA: Diagnosis not present

## 2018-10-06 DIAGNOSIS — J449 Chronic obstructive pulmonary disease, unspecified: Secondary | ICD-10-CM | POA: Diagnosis not present

## 2018-10-06 DIAGNOSIS — G4733 Obstructive sleep apnea (adult) (pediatric): Secondary | ICD-10-CM | POA: Diagnosis not present

## 2018-10-06 DIAGNOSIS — F329 Major depressive disorder, single episode, unspecified: Secondary | ICD-10-CM | POA: Diagnosis not present

## 2018-10-06 DIAGNOSIS — K7460 Unspecified cirrhosis of liver: Secondary | ICD-10-CM | POA: Diagnosis not present

## 2018-10-06 DIAGNOSIS — Z794 Long term (current) use of insulin: Secondary | ICD-10-CM | POA: Diagnosis not present

## 2018-10-06 DIAGNOSIS — K7581 Nonalcoholic steatohepatitis (NASH): Secondary | ICD-10-CM | POA: Diagnosis not present

## 2018-10-06 DIAGNOSIS — E1140 Type 2 diabetes mellitus with diabetic neuropathy, unspecified: Secondary | ICD-10-CM | POA: Diagnosis not present

## 2018-10-06 DIAGNOSIS — E669 Obesity, unspecified: Secondary | ICD-10-CM | POA: Diagnosis not present

## 2018-10-06 DIAGNOSIS — E039 Hypothyroidism, unspecified: Secondary | ICD-10-CM | POA: Diagnosis not present

## 2018-10-06 NOTE — Telephone Encounter (Signed)
Forwarding message to medical assistant to call Levada Dy @ [facility] regarding patient--- Agency rep contact ph# [phone].  --glh

## 2018-10-07 NOTE — Discharge Summary (Signed)
[facility] Discharge Summary   Patient: [name] OXB:[id number]   PCP: Mellody Dance, DO DOB: 1934/03/23   Date of admission: 09/26/2018   Date of discharge: 10/03/2018     Discharge Diagnoses:   Principal Problem:   Acute on chronic respiratory failure with hypoxia ([place]) Active Problems:   Coronary artery disease   ILD (interstitial lung disease) (HCC)   Thrombocytopenia, unspecified (HCC)   Depression   Insulin dependent diabetes mellitus with complications (HCC)   Gastroesophageal reflux disease   Pancytopenia (HCC)   COPD exacerbation (HCC)   Acute on chronic diastolic CHF (congestive heart failure) ([place])   Admitted From: home Disposition:  home  Recommendations for Outpatient Follow-up:  1. Please follow up with PCP in  2 week    Contact information for follow-up providers    Mellody Dance, DO. Go on 10/09/2018.   Specialty:  Family Medicine Why:  @3 :00pm please arrive @ 2:45pm Contact information: [place] [address] [place] [facility].   Why:  They will do your home healthcare at your daughters home Contact information: [address] [place] [address] [phone]            Contact information for after-discharge care    Destination    [facility] .   Service:  Skilled Nursing Contact information: [place] [phone]                 Diet recommendation: Cardiac diet  Activity: The patient is advised to gradually reintroduce usual activities.  Discharge Condition: good  Code Status: Full code  History of present illness: As per the H and P dictated on admission, "Sarin A Mahl is a 85 y.o. female with medical history significant of hypertension, hyperlipidemia, COPD on 2 L nasal cannula oxygen, GERD, hypothyroidism, depression, dCHF, cirrhosis-nonalcoholic, CAD, OSA not on CPAP,  obesity, thrombocytopenia, GI bleeding, interstitial lung disease, who presents with shortness of breath.  Patient was recently hospitalized from 1/27-1/29 due to shortness of breath secondary to suspected pneumonia, COPD exacerbation and CHF.  Patient was discharged home in stable condition.  Patient states that she started having shortness of breath again in the past several days, which has been progressively worsening.  She has dry cough, no fever or chills.  Denies chest pain.  Patient states that she has gained 30 pounds in the past several days.  No nausea, vomiting, diarrhea, abdominal pain, symptoms of UTI.  No unilateral weakness."  Hospital Course:  Summary of her active problems in the hospital is as following. Acute on chronic respiratory failure with hypoxia -I suspect this is secondary to massive ascites, fluid overload, acute on chronic diastolic CHF in the background of cirrhosis/[place]  -20 pound weight gain in 1 month, she weighed 181lbs on 1/7 and weighed 203lbs on 2/9 -Ultrasound guided paracentesis done on 09/29/2018 and 10/01/2018. fluid is consistent with portal hypertension from cirrhosis,  -She was given IV albumin -Treated with IV Lasix, will transition to oral Lasix and Aldactone combination  Massive ascites -Secondary to NASH/cirrhosis -Underwent paracentesis 2/10, 2 L drained, needs another 3 to 4 L drained requested another round of paracentesis, given IV albumin -Will need gastroenterology follow-up post discharge, and fluid restriction along with Lasix and Aldactone, seen by [facility] in the past  Acute on chronic diastolic CHF -As above  COPD/chronic respiratory failure -On  2 L home O2 at baseline, do not suspect acute exacerbation, continue nebs, discontinue steroids  Uncontrolled diabetes mellitus with no significant complication. -Hemoglobin A1c was 9 recently, Continue Lantus and home regimen  Anemia/thrombocytopenia: Acute on chronic.   -Due to  cirrhosis, splenic sequestration  -Monitor  ?  Atrial arrhythmia/SVT -Resolved, continue Coreg -In sinus rhythm at this time -Echocardiogram with EF of 45%, slightly lower than baseline and chronic inferior wall motion abnormality -Due to chronic thrombocytopenia, liver cirrhosis, history of GI bleeds would not be appropriate for full dose anticoagulation therefore will continue medical management and outpatient referral with cardiology  Hypothyroidism: Last TSH noted to be 0.281 on 08/26/2018.  Patient was decreased from 100 mcg to 75 mcg daily on admission. -Repeat TSH in 4 to 6 weeks.  CAD: Patient did not report any significant complaints of chest pain.  Previous history of inferior wall MI and stenting.  Patient not on aspirin due to history of thrombocytopenia and GI bleeds.  GERD -Continue Protonix  Obesity: Body mass index is 35.76 kg/m.    Patient was seen by physical therapy, who recommended home health, which was arranged by case manager. On the day of the discharge the patient's vitals were stable , and no other acute medical condition were reported by patient. the patient was felt safe to be discharge at home with home health.  Consultants: IR Procedures: Paracentesis  DISCHARGE MEDICATION: Allergies as of 10/03/2018      Reactions   Ace Inhibitors Cough   Benadryl [diphenhydramine Hcl (sleep)] Other (See Comments)   hypotension   Penicillins Hives, Swelling   Swelling of arms Has patient had a PCN reaction causing immediate rash, facial/tongue/throat swelling, SOB or lightheadedness with hypotension: unknown Has patient had a PCN reaction causing severe rash involving mucus membranes or skin necrosis: no Has patient had a PCN reaction that required hospitalization: unknown Has patient had a PCN reaction occurring within the last 10 years: no, childhood allergy If all of the above answers are "NO", then may proceed with Cephalosporin use.   Sulfonamide  Derivatives Swelling      Medication List    STOP taking these medications   losartan 25 MG tablet Commonly known as:  COZAAR   potassium chloride 10 MEQ tablet Commonly known as:  K-DUR,KLOR-CON     TAKE these medications   albuterol 108 (90 Base) MCG/ACT inhaler Commonly known as:  PROAIR HFA Inhale 2 puffs into the lungs every 6 (six) hours as needed for wheezing or shortness of breath.   carvedilol 3.125 MG tablet Commonly known as:  COREG TAKE ONE TABLET BY MOUTH TWO TIMES DAILY WITH A MEAL What changed:  See the new instructions.   EASY TOUCH PEN NEEDLES 31G X 5 MM Misc Generic drug:  Insulin Pen Needle USE TO INJECT INSULIN 5 TIMES A DAY   escitalopram 20 MG tablet Commonly known as:  LEXAPRO TAKE 1 TABLET (20 MG TOTAL) BY MOUTH DAILY.   Fluticasone-Salmeterol 250-50 MCG/DOSE Aepb Commonly known as:  ADVAIR Inhale 1 puff into the lungs 2 (two) times daily.   FREESTYLE LITE Devi Use to check blood sugars 2-3 times daily   furosemide 40 MG tablet Commonly known as:  LASIX Take 80 mg daily till 10/05/2018, after that take 84m daily. What changed:    how much to take  how to take this  when to take this  additional instructions   insulin aspart 100 UNIT/ML FlexPen Commonly known as:  NOVOLOG FLEXPEN Inject  30 Units into the skin 3 (three) times daily with meals. What changed:    how much to take  additional instructions   Insulin Glargine 100 UNIT/ML Solostar Pen Commonly known as:  LANTUS SOLOSTAR Inject 35 Units into the skin 2 (two) times daily. What changed:    how much to take  when to take this  additional instructions   levothyroxine 100 MCG tablet Commonly known as:  SYNTHROID, LEVOTHROID Take 1 tablet (100 mcg total) by mouth daily.   montelukast 10 MG tablet Commonly known as:  SINGULAIR TAKE 1 TABLET BY MOUTH EVERY EVENING. What changed:  when to take this   nitroGLYCERIN 0.4 MG SL tablet Commonly known as:   NITROSTAT Place 1 tab under the tongue every 5 minutes as needed for chest pain. Please schedule appt for further refills. 3rd/final attempt.   ONE TOUCH ULTRA TEST test strip Generic drug:  glucose blood TEST BLOOD SUGAR 2-3 TIMES A DAY   ONETOUCH DELICA LANCETS 26S Misc USE TO CHECK BLOOD SUGAR THREE TIMES A DAY   pantoprazole 20 MG tablet Commonly known as:  PROTONIX Take 1 tablet (20 mg total) by mouth 2 (two) times daily. What changed:  when to take this   REFRESH OPTIVE MEGA-3 0.5-1-0.5 % Soln Generic drug:  Carboxymeth-Glyc-Polysorb PF Apply 1-2 drops to eye 2 (two) times daily.   spironolactone 25 MG tablet Commonly known as:  ALDACTONE Take 2 tablets (50 mg total) by mouth daily.   SYSTANE 0.4-0.3 % Soln Generic drug:  Polyethyl Glycol-Propyl Glycol Apply 1 drop to eye 4 (four) times daily.   SYSTANE BALANCE 0.6 % Soln Generic drug:  Propylene Glycol Apply 1 drop to eye 2 (two) times daily.   SYSTANE NIGHTTIME Oint Place 1 application into both eyes at bedtime as needed (irritation).   GENTEAL TEARS NIGHT-TIME Oint Apply 1 application to eye at bedtime.   VISION FORMULA/LUTEIN Tabs Take 1 tablet by mouth daily.   Vitamin D3 50 MCG (2000 UT) Tabs Take 1 tablet by mouth daily.      Allergies  Allergen Reactions  . Ace Inhibitors Cough  . Benadryl [Diphenhydramine Hcl (Sleep)] Other (See Comments)    hypotension  . Penicillins Hives and Swelling    Swelling of arms Has patient had a PCN reaction causing immediate rash, facial/tongue/throat swelling, SOB or lightheadedness with hypotension: unknown Has patient had a PCN reaction causing severe rash involving mucus membranes or skin necrosis: no Has patient had a PCN reaction that required hospitalization: unknown Has patient had a PCN reaction occurring within the last 10 years: no, childhood allergy If all of the above answers are "NO", then may proceed with Cephalosporin use.   . Sulfonamide  Derivatives Swelling   Discharge Instructions    Diet - low sodium heart healthy   Complete by:  As directed    Discharge instructions   Complete by:  As directed    It is important that you read the given instructions as well as go over your medication list with RN to help you understand your care after this hospitalization.  Discharge Instructions: Please follow-up with PCP in 1-2 weeks  Please request your primary care physician to go over all Hospital Tests and Procedure/Radiological results at the follow up. Please get all Hospital records sent to your PCP by signing hospital release before you go home.   Do not take more than prescribed Pain, Sleep and Anxiety Medications. You were cared for by a hospitalist during your  hospital stay. If you have any questions about your discharge medications or the care you received while you were in the hospital after you are discharged, you can call the unit @UNIT @ you were admitted to and ask to speak with the hospitalist on call if the hospitalist that took care of you is not available.  Once you are discharged, your primary care physician will handle any further medical issues. Please note that NO REFILLS for any discharge medications will be authorized once you are discharged, as it is imperative that you return to your primary care physician (or establish a relationship with a primary care physician if you do not have one) for your aftercare needs so that they can reassess your need for medications and monitor your lab values. You Must read complete instructions/literature along with all the possible adverse reactions/side effects for all the Medicines you take and that have been prescribed to you. Take any new Medicines after you have completely understood and accept all the possible adverse reactions/side effects. Wear Seat belts while driving. If you have smoked or chewed Tobacco in the last 2 yrs please stop smoking and/or stop any Recreational  drug use.  If you drink alcohol, please moderate the use and do not drive, operating heavy machinery, perform activities at heights, swimming or participation in water activities or provide baby sitting services under influence.   Increase activity slowly   Complete by:  As directed      Discharge Exam: Filed Weights   10/01/18 0558 10/02/18 0543 10/03/18 0501  Weight: 86.5 kg 83.1 kg 80.6 kg   Vitals:   10/03/18 0900 10/03/18 1150  BP: (!) 105/52 (!) 113/36  Pulse: 66 66  Resp:  16  Temp:  97.8 F (36.6 C)  SpO2:  100%   General: Appear in no distress, no Rash; Oral Mucosa moist. Cardiovascular: S1 and S2 Present, no Murmur, no JVD Respiratory: Bilateral Air entry present and Clear to Auscultation, no Crackles, no wheezes Abdomen: Bowel Sound present, Soft and no tenderness Extremities: no Pedal edema, no calf tenderness Neurology: Grossly no focal neuro deficit.  The results of significant diagnostics from this hospitalization (including imaging, microbiology, ancillary and laboratory) are listed below for reference.    Significant Diagnostic Studies: Dg Chest 2 View  Result Date: 09/26/2018 CLINICAL DATA:  Shortness of breath EXAM: CHEST - 2 VIEW COMPARISON:  Chest radiograph 09/25/2018 FINDINGS: Monitoring leads overlie the patient. Stable cardiomegaly. Low lung volumes. Left basilar atelectasis. No pleural effusion or pneumothorax. Thoracic spine degenerative changes. IMPRESSION: Left basilar atelectasis.  No acute cardiopulmonary process. Electronically Signed   By: Lovey Newcomer M.D.   On: 09/26/2018 16:10   Dg Chest 2 View  Result Date: 09/25/2018 CLINICAL DATA:  85 year old female with shortness of breath, recent hospital admission EXAM: CHEST - 2 VIEW COMPARISON:  Prior chest x-ray 09/15/2018 FINDINGS: Stable cardiomegaly. Slightly increased diffuse interstitial prominence compared to prior. Chronic central bronchitic changes remain stable. Atherosclerotic calcifications  again noted in the transverse aorta. No acute osseous abnormality. No pleural effusion or pneumothorax. IMPRESSION: 1. Slightly increased interstitial prominence bilaterally. Differential considerations include mild interstitial edema and atypical or viral respiratory infection. 2. Stable cardiomegaly. 3.  Aortic Atherosclerosis (ICD10-170.0) Electronically Signed   By: Jacqulynn Cadet M.D.   On: 09/25/2018 17:12   Dg Chest 2 View  Result Date: 09/15/2018 CLINICAL DATA:  85 y/o  F; central chest pain and tightness. EXAM: CHEST - 2 VIEW COMPARISON:  01/13/2016 chest radiograph.  10/13/2012  CT chest. FINDINGS: Stable mildly enlarged cardiac silhouette given projection and technique. Aortic atherosclerosis with calcification. Pulmonary venous hypertension. Bronchitic changes in the lung bases. No focal consolidation. No pleural effusion or pneumothorax. No acute osseous abnormality is evident. IMPRESSION: Pulmonary venous hypertension. Bronchitic changes in the lung bases. No focal consolidation. Electronically Signed   By: Kristine Garbe M.D.   On: 09/15/2018 15:01   Ir Paracentesis  Result Date: 10/01/2018 INDICATION: Patient with history of abdominal distention, ascites. Request is made for therapeutic paracentesis of up to 4 L maximum. EXAM: ULTRASOUND GUIDED THERAPEUTIC PARACENTESIS MEDICATIONS: 10 mL 1% lidocaine COMPLICATIONS: None immediate. PROCEDURE: Informed written consent was obtained from the patient after a discussion of the risks, benefits and alternatives to treatment. A timeout was performed prior to the initiation of the procedure. Initial ultrasound scanning demonstrates a small amount of ascites within the left lateral abdomen. The right lower abdomen was prepped and draped in the usual sterile fashion. 1% lidocaine was used for local anesthesia. Following this, a Safe-T-Centesis catheter was introduced. An ultrasound image was saved for documentation purposes. The paracentesis  was performed. The catheter was removed and a dressing was applied. The patient tolerated the procedure well without immediate post procedural complication. FINDINGS: A total of approximately 1.3 L of amber fluid was removed. IMPRESSION: Successful ultrasound-guided therapeutic paracentesis yielding 1.3 liters of peritoneal fluid. Read by: Brynda Greathouse PA-C Electronically Signed   By: Jacqulynn Cadet M.D.   On: 10/01/2018 09:47   Ir Paracentesis  Result Date: 09/29/2018 INDICATION: Ascites secondary to non alcoholic cirrhosis. Request for diagnostic and therapeutic paracentesis. EXAM: ULTRASOUND GUIDED PARACENTESIS MEDICATIONS: 1% lidocaine 10 mL COMPLICATIONS: None immediate. PROCEDURE: Informed written consent was obtained from the patient after a discussion of the risks, benefits and alternatives to treatment. A timeout was performed prior to the initiation of the procedure. Initial ultrasound scanning demonstrates a small amount of ascites within the left lower abdominal quadrant. The left lower abdomen was prepped and draped in the usual sterile fashion. 1% lidocaine with epinephrine was used for local anesthesia. Following this, a 19 gauge, 10-cm, Yueh catheter was introduced. An ultrasound image was saved for documentation purposes. The paracentesis was performed. The catheter was removed and a dressing was applied. The patient tolerated the procedure well without immediate post procedural complication. FINDINGS: A total of approximately 2 L of hazy yellow fluid was removed. Samples were sent to the laboratory as requested by the clinical team. IMPRESSION: Successful ultrasound-guided paracentesis yielding 2 liters of peritoneal fluid. Read by: Gareth Eagle, PA-C Electronically Signed   By: Jerilynn Mages.  Shick M.D.   On: 09/29/2018 14:25    Microbiology: Recent Results (from the past 240 hour(s))  Culture, body fluid-bottle     Status: None   Collection Time: 09/29/18  2:29 PM  Result Value Ref Range  Status   Specimen Description PERITONEAL  Final   Special Requests NONE  Final   Culture   Final    NO GROWTH 5 DAYS Performed at [facility], [address], [place], [place] [address]    Report Status 10/04/2018 FINAL  Final  Gram stain     Status: None   Collection Time: 09/29/18  2:29 PM  Result Value Ref Range Status   Specimen Description PERITONEAL  Final   Special Requests NONE  Final   Gram Stain   Final    WBC PRESENT, PREDOMINANTLY MONONUCLEAR NO ORGANISMS SEEN CYTOSPIN SMEAR Performed at [facility], [place]  [address], [place], [place] [address]    Report Status 09/29/2018 FINAL  Final     Labs: CBC: Recent Labs  Lab 10/01/18 0426 10/02/18 0453  WBC 2.9* 2.6*  NEUTROABS  --  1.6*  HGB 8.6* 9.1*  HCT 28.9* 29.9*  MCV 82.1 82.6  PLT 30* 32*   Basic Metabolic Panel: Recent Labs  Lab 10/01/18 0426 10/02/18 0453 10/03/18 0853  NA 138 140 139  K 3.8 4.0 4.3  CL 99 102 100  CO2 31 28 28   GLUCOSE 92 88 87  BUN 15 15 13   CREATININE 0.77 0.83 0.90  CALCIUM 8.5* 8.5* 9.4  MG  --  2.1 2.1   Liver Function Tests: Recent Labs  Lab 10/02/18 0453  AST 21  ALT 17  ALKPHOS 87  BILITOT 1.4*  PROT 5.9*  ALBUMIN 3.5   No results for input(s): LIPASE, AMYLASE in the last 168 hours. No results for input(s): AMMONIA in the last 168 hours. Cardiac Enzymes: No results for input(s): CKTOTAL, CKMB, CKMBINDEX, TROPONINI in the last 168 hours. BNP (last 3 results) Recent Labs    09/15/18 1950 09/25/18 1253 09/26/18 1518  BNP 394.1* 228.6* 230.4*   CBG: Recent Labs  Lab 10/02/18 1201 10/02/18 1544 10/02/18 2133 10/03/18 0734 10/03/18 1147  GLUCAP 163* 219* 118* 73 210*   Time spent: 35 minutes  Signed:  Berle Mull  [facility] 10/03/2018

## 2018-10-08 ENCOUNTER — Telehealth: Payer: Self-pay | Admitting: Family Medicine

## 2018-10-08 NOTE — Telephone Encounter (Signed)
Called and approved PT orders for patient. MPulliam, CMA/RT(R)

## 2018-10-08 NOTE — Telephone Encounter (Signed)
[name] requesting order for plan of care.  Verbal authorization given.  She will fax hard copy of orders for signature.  Tiajuana Amass, CMA

## 2018-10-08 NOTE — Telephone Encounter (Signed)
Daleen Snook PT with [facility] is requesting a call back to get verbal orders for patient's PT. She can be reached at [phone]

## 2018-10-09 ENCOUNTER — Telehealth: Payer: Self-pay

## 2018-10-09 ENCOUNTER — Inpatient Hospital Stay: Payer: PPO | Admitting: Family Medicine

## 2018-10-09 ENCOUNTER — Telehealth: Payer: Self-pay | Admitting: Cardiovascular Disease

## 2018-10-09 ENCOUNTER — Other Ambulatory Visit: Payer: Self-pay | Admitting: *Deleted

## 2018-10-09 ENCOUNTER — Encounter: Payer: Self-pay | Admitting: Family Medicine

## 2018-10-09 MED ORDER — FUROSEMIDE 40 MG PO TABS: ORAL_TABLET | ORAL | 3 refills | Status: DC

## 2018-10-09 MED ORDER — FUROSEMIDE 40 MG PO TABS: ORAL_TABLET | ORAL | 6 refills | Status: DC

## 2018-10-09 NOTE — Telephone Encounter (Signed)
Pt c/o swelling: STAT is pt has developed SOB within 24 hours  1) How much weight have you gained and in what time span? 5 pound in 1 day  2) If swelling, where is the swelling located? stomach  3) Are you currently taking a fluid pill? Yes   4) Are you currently SOB? No   5) Do you have a log of your daily weights (if so, list)? Yes  6) Have you gained 3 pounds in a day or 5 pounds in a week? Yes  7) Have you traveled recently? No, also patient daughter would like for her to come in today.

## 2018-10-09 NOTE — Telephone Encounter (Signed)
Spoke with pt's daughter and pt has had weight gain of 5 lbs in 24 hours wt 166 now 171 all in abdomen area no other complaints Discussed with Dr Acie Fredrickson have pt increase Lasix The patient currently taking Lasix 40 mg daily if pt urinating a lot then add additional 40 mg in afternoon and if not have pt take 80 mg daily for 3 days then resume 40 mg and needs f/u in 1 week . Appt made for 10/16/18 at 11:30 am the patient's daughter aware of recommendations and appt .Verbalizes understanding .Adonis Housekeeper

## 2018-10-09 NOTE — Telephone Encounter (Signed)
Pt's daughter, [name], called stating that pt has had a 5lb weight gain within 24 hours.  Per Dr. Raliegh Scarlet, advised pt's daughter to call cardiologist for an appointment today.  Per Dr. Raliegh Scarlet, this weight gain is related to CHF.  Dorie Rank to call us back if she is unable to get an appointment for her mother today with cardiology.  Daughter expressed understanding and is agreeable.  Charyl Bigger, CMA

## 2018-10-09 NOTE — Telephone Encounter (Signed)
Good morning Ms. Hammond/ Dr Elease Hashimoto,  My mother is scheduled for a hospital follow up visit with your office on 10/16/2018, upon calling the office today we were instructed to increase her Lasix back up to 40mg  2 daily x 3 days then resume 40mg  1 daily due to an overnight weight gain of 5 pounds, I am in need of a new prescription for this medication, she will not have enough until that follow up visit.Can you please send that to [facility] on [address] in [place] as she is staying with me in [place] currently.Please call me at my office if you have any questions or call her directly. My number is [phone] or [phone] her number is [phone].  Thank you, Precious Gilding c/o Ronie Spies     I faxed refill of lasix to [facility] on [address] in [place].

## 2018-10-10 ENCOUNTER — Other Ambulatory Visit: Payer: Self-pay

## 2018-10-10 DIAGNOSIS — R161 Splenomegaly, not elsewhere classified: Secondary | ICD-10-CM | POA: Diagnosis not present

## 2018-10-10 DIAGNOSIS — K7460 Unspecified cirrhosis of liver: Secondary | ICD-10-CM | POA: Diagnosis not present

## 2018-10-10 DIAGNOSIS — D696 Thrombocytopenia, unspecified: Secondary | ICD-10-CM

## 2018-10-10 LAB — HEPATIC FUNCTION PANEL
ALT: 46 — AB (ref 7–35)
AST: 45 — AB (ref 13–35)
Bilirubin, Total: 0.9

## 2018-10-10 LAB — CBC AND DIFFERENTIAL
HCT: 35 — AB (ref 36–46)
Hemoglobin: 11.1 — AB (ref 12.0–16.0)
Neutrophils Absolute: 3
Platelets: 60 — AB (ref 150–399)
WBC: 4.2

## 2018-10-10 LAB — IRON,TIBC AND FERRITIN PANEL
%SAT: 9.4
Ferritin: 28.9
Iron: 35
TIBC: 372

## 2018-10-10 LAB — BASIC METABOLIC PANEL
BUN: 27 — AB (ref 4–21)
Creatinine: 0.9 (ref 0.5–1.1)
Potassium: 4 (ref 3.4–5.3)
Sodium: 136 — AB (ref 137–147)

## 2018-10-10 LAB — VITAMIN B12: Vitamin B-12: 813

## 2018-10-10 MED ORDER — INSULIN ASPART 100 UNIT/ML FLEXPEN: 10.0000 [IU] | PEN_INJECTOR | Freq: Three times a day (TID) | SUBCUTANEOUS | Status: DC

## 2018-10-13 NOTE — Patient Outreach (Signed)
Patient triggered Red on EMMI COPD Dashboard, notification sent to:  Elmer Picker, RN

## 2018-10-14 ENCOUNTER — Other Ambulatory Visit: Payer: Self-pay | Admitting: *Deleted

## 2018-10-14 NOTE — Patient Outreach (Signed)
[facility]) Care Management  10/14/2018  [name] [birth year] [id number]   Subjective: Telephone call to patient's home number, no answer, left HIPAA compliant voicemail message, and requested call back.    Objective: Per KPN (Knowledge Performance Now, point of care tool) and chart review, patient hospitalized 09/26/2018  -10/03/2018 for acute respiratory failure.  Patient also hospitalized  09/15/2018 -09/17/2018 for pneumonia.   Patient has a history of COPD, diabetes, Coronary artery disease, ILD (interstitial lung disease), Pancytopenia, and Acute on chronic diastolic CHF (congestive heart failure).    Assessment: Received [facility] EMMI COPD Red flag alerts Times 5, follow up referral on 10/13/2018.  Red flag alert triggers, Day # 5, Day #3, patient answered yes to the following questions: Feeling worse overall?  Harder to breathe? More mucus or thicker mucus? Mucus change color? Questions/problems with meds?  Patient has also used rescue inhaler 2 times in the past 24 hours as of 10/13/2018.  [facility] EMMI follow up pending patient contact.     Plan: RNCM will send unsuccessful outreach letter, [facility] pamphlet, handout: Know Before You Go, will call patient for 2nd telephone outreach attempt within 4 business days, [facility] EMMI follow up, and proceed with case closure, within 10 business days if no return call.      Tanikka Bresnan H. Gardiner Barefoot, BSN, CCM [facility] Care Management [facility] Telephonic CM Phone: [phone] Fax: [phone]

## 2018-10-15 ENCOUNTER — Other Ambulatory Visit: Payer: Self-pay | Admitting: *Deleted

## 2018-10-15 NOTE — Patient Outreach (Addendum)
[facility]) Care Management  10/15/2018  [name] Dec 08, 1933 [id number]   Subjective: Received voicemail message from [facility], states she is returning call, and requested call back. Telephone call to patient's home number, no answer, left HIPAA compliant voicemail message, and requested call back.    Objective: Per KPN (Knowledge Performance Now, point of care tool) and chart review, patient hospitalized 09/26/2018  -10/03/2018 for acute respiratory failure.  Patient also hospitalized  09/15/2018 -09/17/2018 for pneumonia.   Patient has a history of COPD, diabetes, Coronary artery disease, ILD (interstitial lung disease), Pancytopenia, and Acute on chronic diastolic CHF (congestive heart failure).    Assessment: Received [employer] EMMI COPD Red flag alerts Times 5, follow up referral on 10/13/2018.  Red flag alert triggers, Day # 5, Day #3, patient answered yes to the following questions: Feeling worse overall?  Harder to breathe? More mucus or thicker mucus? Mucus change color? Questions/problems with meds?  Patient has also used rescue inhaler 2 times in the past 24 hours as of 10/13/2018.  [facility] EMMI follow up pending patient contact.     Plan: RNCM has sent unsuccessful outreach letter, [facility] pamphlet, handout: Know Before You Go, will call patient for 3rd telephone outreach attempt within 4 business days, [facility] EMMI follow up, and proceed with case closure, within 10 business days if no return call.     [name], BSN, CCM [facility] Care Management [facility] Telephonic CM Phone: [phone] Fax: [phone]

## 2018-10-15 NOTE — Progress Notes (Signed)
Cardiology Office Note   Date:  10/16/2018   ID:  [name], DOB 11-May-1934, MRN [id number]  PCP:  Thomasene Lot, DO  Cardiologist:  Dr. Elease Hashimoto    Chief Complaint  Patient presents with  . Leg Swelling      History of Present Illness: [name] is a 85 y.o. female who presents for wt gain with edema after hospitalization.  Last seen by Dr. Elease Hashimoto 2016.  We increased lasix to 40 BID.  Here today for eval.   Echo 40-45%,   Hx of inf wall MI and stent to RCA, later stent to LAD  Last cath 2010 with patent stents total intermidate and non obstructive disease otherwise.   Hospitalized 09/17/18 COPD exacerbation and PNA and 10/03/18 for acute on chronic respiratory failure with hypoxia, ILD, COPD exacerbation.   Other hx of CAD with MI, chronic diastolic HF, IDDM, morbid obesity, GERD, hypothyroidism, home 02.  OSA not on CPAP.  Also non alcoholic cirrhosis. She has massive ascites with paracentesis and given albumin .  Discharged on lasix and aldactone.  She has anemia and thrombocytopenia and had SVT  She is not thought to be anticoagulation candidate due to above issues. Wt at discharge 83.1 kg  She weighed 181lbs on 1/7 and 2/9 wt of 203lbs.   Paracentesis 2L on the 10th and on 12 th 1.3 L.    Today the increase of lasix we did earlier this month did help her SOB.  Now her wt is climbing about 2 lbs per day.  She feels along with her daughter that her abd girth is increasing.   No lower ext edema.  She is having chest pain maybe once a week lasting 5-10 min.   This is usually associated with rapid HR.  She does not awaken from sleep with SOB.  She does have DOE as well.  She wears her home oxygen.  No bleeding issues and her last platelets were 60K and improved.  Her iron was low so now on iron supplementation.  She is staying with one of her daughters.        Past Medical History:  Diagnosis Date  . Cirrhosis, non-alcoholic (HCC)   . Coronary artery disease    status post  inferior wall myocardial infarction  . Diabetes mellitus   . Dyslipidemia   . Hypotension 08/02/2010   recent episodes of orthostatic hypotension  . Hypothyroidism   . Leg edema   . Myocardial infarct, old   . Neuropathy of hand    left hand numbness/neuropathy  . Obesity   . Sleep apnea     Past Surgical History:  Procedure Laterality Date  . ABDOMINAL HYSTERECTOMY    . CARDIAC CATHETERIZATION     The ejection fraction is around 50%  . CHOLECYSTECTOMY    . CORONARY STENT PLACEMENT    . ESOPHAGOGASTRODUODENOSCOPY N/A 12/12/2012   Procedure: ESOPHAGOGASTRODUODENOSCOPY (EGD);  Surgeon: Florencia Reasons, MD;  Location: [facility];  Service: Endoscopy;  Laterality: N/A;  . FLEXIBLE SIGMOIDOSCOPY N/A 12/12/2012   Procedure: FLEXIBLE SIGMOIDOSCOPY;  Surgeon: Florencia Reasons, MD;  Location: [facility];  Service: Endoscopy;  Laterality: N/A;  . IR PARACENTESIS  09/29/2018  . IR PARACENTESIS  10/01/2018  . TUBAL LIGATION    . WRIST SURGERY       Current Outpatient Medications  Medication Sig Dispense Refill  . albuterol (PROAIR HFA) 108 (90 Base) MCG/ACT inhaler Inhale 2 puffs into the lungs every 6 (six) hours  as needed for wheezing or shortness of breath. 8.5 each 2  . Blood Glucose Monitoring Suppl (FREESTYLE LITE) DEVI Use to check blood sugars 2-3 times daily 1 each 0  . Carboxymeth-Glyc-Polysorb PF (REFRESH OPTIVE MEGA-3) 0.5-1-0.5 % SOLN Apply 1-2 drops to eye 2 (two) times daily.     . Cholecalciferol (VITAMIN D3) 50 MCG (2000 UT) TABS Take 1 tablet by mouth daily.    Marland Kitchen EASY TOUCH PEN NEEDLES 31G X 5 MM MISC USE TO INJECT INSULIN 5 TIMES A DAY 200 each 11  . escitalopram (LEXAPRO) 20 MG tablet TAKE 1 TABLET (20 MG TOTAL) BY MOUTH DAILY. 90 tablet 2  . Fluticasone-Salmeterol (ADVAIR) 250-50 MCG/DOSE AEPB Inhale 1 puff into the lungs 2 (two) times daily. 60 each 1  . insulin aspart (NOVOLOG FLEXPEN) 100 UNIT/ML FlexPen Inject 10 Units into the skin 3 (three) times daily with  meals.    . Insulin Glargine (LANTUS SOLOSTAR) 100 UNIT/ML Solostar Pen Inject 35 Units into the skin 2 (two) times daily. 15 mL 0  . levothyroxine (SYNTHROID, LEVOTHROID) 100 MCG tablet Take 1 tablet (100 mcg total) by mouth daily. 90 tablet 0  . montelukast (SINGULAIR) 10 MG tablet TAKE 1 TABLET BY MOUTH EVERY EVENING. (Patient taking differently: Take 10 mg by mouth daily. ) 90 tablet 1  . Multiple Vitamins-Minerals (VISION FORMULA/LUTEIN) TABS Take 1 tablet by mouth daily.    . nitroGLYCERIN (NITROSTAT) 0.4 MG SL tablet Place 1 tab under the tongue every 5 minutes as needed for chest pain. Please schedule appt for further refills. 3rd/final attempt. 15 tablet 0  . ONE TOUCH ULTRA TEST test strip TEST BLOOD SUGAR 2-3 TIMES A DAY 100 each 11  . ONETOUCH DELICA LANCETS 33G MISC USE TO CHECK BLOOD SUGAR THREE TIMES A DAY 100 each 10  . pantoprazole (PROTONIX) 20 MG tablet Take 1 tablet (20 mg total) by mouth 2 (two) times daily. (Patient taking differently: Take 20 mg by mouth daily. ) 180 tablet 1  . Polyethyl Glycol-Propyl Glycol (SYSTANE) 0.4-0.3 % SOLN Apply 1 drop to eye 4 (four) times daily.     Marland Kitchen Propylene Glycol (SYSTANE BALANCE) 0.6 % SOLN Apply 1 drop to eye 2 (two) times daily.    Marland Kitchen spironolactone (ALDACTONE) 25 MG tablet Take 2 tablets (50 mg total) by mouth daily. 60 tablet 0  . White Petrolatum-Mineral Oil (GENTEAL TEARS NIGHT-TIME) OINT Apply 1 application to eye at bedtime.    Cliffton Asters Petrolatum-Mineral Oil (SYSTANE NIGHTTIME) OINT Place 1 application into both eyes at bedtime as needed (irritation).     . furosemide (LASIX) 40 MG tablet Take 1 tablet (40 mg total) by mouth 2 (two) times daily. 180 tablet 3  . metoprolol tartrate (LOPRESSOR) 25 MG tablet Take 0.5 tablets (12.5 mg total) by mouth 2 (two) times daily. 180 tablet 3   No current facility-administered medications for this visit.     Allergies:   Ace inhibitors; Benadryl [diphenhydramine hcl (sleep)]; Penicillins; and  Sulfonamide derivatives    Social History:  The patient  reports that she quit smoking about 45 years ago. She has never used smokeless tobacco. She reports that she does not drink alcohol or use drugs.   Family History:  The patient's family history includes Alcohol abuse in her maternal uncle and son; Cancer in her son; Diabetes in her brother, daughter, daughter, father, mother, sister, son, son, and son; Emphysema in her brother; Heart attack in her daughter, father, and paternal grandfather; Heart disease  in her father; Hyperlipidemia in her brother, father, and sister; Hypertension in her brother, father, and sister; Suicidality in her son.    ROS:  General:no colds or fevers, + weight changes Skin:no rashes or ulcers HEENT:no blurred vision, no congestion CV:see HPI PUL:see HPI GI:no diarrhea constipation or melena, no indigestion GU:no hematuria, no dysuria MS:no joint pain, no claudication Neuro:no syncope, no lightheadedness Endo:+ diabetes, no thyroid disease  Wt Readings from Last 3 Encounters:  10/16/18 181 lb (82.1 kg)  10/16/18 180 lb 11.2 oz (82 kg)  10/03/18 177 lb 12.8 oz (80.6 kg)     PHYSICAL EXAM: VS:  BP (!) 104/50   Pulse (!) 109   Ht 5' (1.524 m)   Wt 180 lb 11.2 oz (82 kg)   SpO2 92%   BMI 35.29 kg/m  , BMI Body mass index is 35.29 kg/m. General:Pleasant affect, NAD Skin:Warm and dry, brisk capillary refill HEENT:normocephalic, sclera clear, mucus membranes moist Neck:supple, no JVD, no bruits  Heart:S1S2 RRR with freq premature beats  without murmur, gallup, rub or click Lungs:clear without rales, rhonchi, or wheezes YQI:HKVQ, non tender, + BS, do not palpate liver spleen or masses Ext:no lower ext edema, 2+ pedal pulses, 2+ radial pulses Neuro:alert and oriented, MAE, follows commands, + facial symmetry    EKG:  EKG is ordered today. The ekg ordered today demonstrates SR with PAC no acute changes.  Hx of inf wall MI   Recent  Labs: 09/26/2018: B Natriuretic Peptide 230.4 09/27/2018: TSH 0.929 10/02/2018: ALT 17; Hemoglobin 9.1; Platelets 32 10/03/2018: BUN 13; Creatinine, Ser 0.90; Magnesium 2.1; Potassium 4.3; Sodium 139    Lipid Panel    Component Value Date/Time   CHOL 154 03/12/2018 1000   TRIG 145 03/12/2018 1000   HDL 50 03/12/2018 1000   CHOLHDL 3.1 03/12/2018 1000   CHOLHDL 2.5 05/02/2016 0758   VLDL 25 05/02/2016 0758   LDLCALC 75 03/12/2018 1000       Other studies Reviewed: Additional studies/ records that were reviewed today include:   ECHO 09/27/18 . IMPRESSIONS  1. The left ventricle has mild-moderately reduced systolic function of 40-45%. The cavity size was normal. There is no increased left ventricular wall thickness. Echo evidence of pseudonormalization in diastolic relaxation Elevated left ventricular  end-diastolic pressure.  2. There is akinesis of the entire inferior left ventricular segment.  3. The right ventricle has normal systolic function. The cavity was normal. There is no increase in right ventricular wall thickness. Right ventricular systolic pressure could not be assessed.  4. The mitral valve is normal in structure. There is mild thickening and moderate calcification.  5. The tricuspid valve is normal in structure.  6. The aortic valve is normal in structure. There is mild thickening and mild calcification of the aortic valve.  FINDINGS  Left Ventricle: The left ventricle has mild-moderately reduced systolic function of 40-45%. The cavity size was normal. There is no increased left ventricular wall thickness. Echo evidence of pseudonormalization in diastolic relaxation Elevated left  ventricular end-diastolic pressure There is akinesis of the entire inferior left ventricular segment. Definity contrast agent was given IV to delineate the left ventricular endocardial borders. Right Ventricle: The right ventricle has normal systolic function. The cavity was normal. There is no  increase in right ventricular wall thickness. Right ventricular systolic pressure could not be assessed. Left Atrium: left atrial size was normal in size Right Atrium: right atrial size was normal in size Interatrial Septum: No atrial level shunt detected by  color flow Doppler.  Pericardium: There is no evidence of pericardial effusion. Mitral Valve: The mitral valve is normal in structure. There is mild thickening and moderate calcification. Mitral valve regurgitation is trivial by color flow Doppler. Tricuspid Valve: The tricuspid valve is normal in structure. Tricuspid valve regurgitation is trivial by color flow Doppler. Aortic Valve: The aortic valve is normal in structure. There is mild thickening and mild calcification of the aortic valve. Aortic valve regurgitation was not visualized by color flow Doppler. Pulmonic Valve: The pulmonic valve was thickened with good excursion. Pulmonic valve regurgitation is trivial by color flow Doppler. Venous: The inferior vena cava was not well visualized.     ASSESSMENT AND PLAN:  1.  Edema with chronic systolic and diastolic HF and a fib in hospital and Ascites with paracentesis X 2 for removal of 3.3 L of fluid in Feb this year.  Recent increase of lasix to 80 mg for 3 days then back to 40 mg daily which did help.  This seems to be more ascites wt gain today.  Discussed with Dr. Elease Hashimoto and will keep pt on lasix 40 mg BID to see if this will help with edema, lungs clear today and no lower ext edema.  Asked pt to call if became weak on increased dose.  Will check BMP today.  She will need one on follow up visit as well.     2.  Ascites on aldactone 50 mg daily.  Followed by PCP and GI  3.  PAF in hospital now SR with PAC. Though most likely episodic a fib.  With associated chest pain.  I discussed with Dr. Elease Hashimoto, with her platelet count she is not a candidate for anticoagulation and with need for further paracentesis that may be needed.  I discussed  with the pt her risk for stroke is higher without anticoagulation but risk of bleeding is a greater risk.   To help prevent a fib we changed coreg to metoprolol 12.5 BID - pt's BP is borderline so could not increase very much.  Amiodarone would not be good choice with her cirrhosis. She will follow up in 2-3 weeks.  Currently in SR with freq PACs.  4.  CAD with prior hx of inf wall MI and she has stents to RCA and LAD.  Last cath 2010 with patent stents and otherwise non obstructive disease. Pain now most likely related to A fib. If pain continues may need imdur if BP can tolerate  And if severe pain may need cath.   5.  COPD/ILD per pulmonary  6.  IDDM per IM   7.  Anemia now on Iron  8.  Thrombocytopenia improved from hospital.         Current medicines are reviewed with the patient today.  The patient Has no concerns regarding medicines.  The following changes have been made:  See above Labs/ tests ordered today include:see above  Disposition:   FU:  see above  Signed, Nada Boozer, NP  10/16/2018 5:23 PM    [facility] [address] [place], [place], [place]  [address]/ [address] [employer] [address] [employer], [place] Phone: [phone]; Fax: [phone]  [phone]

## 2018-10-16 ENCOUNTER — Encounter: Payer: Self-pay | Admitting: Cardiology

## 2018-10-16 ENCOUNTER — Ambulatory Visit (INDEPENDENT_AMBULATORY_CARE_PROVIDER_SITE_OTHER): Payer: PPO | Admitting: Family Medicine

## 2018-10-16 ENCOUNTER — Ambulatory Visit: Payer: PPO | Admitting: Cardiology

## 2018-10-16 ENCOUNTER — Encounter: Payer: Self-pay | Admitting: Family Medicine

## 2018-10-16 VITALS — BP 104/50 | HR 109 | Ht 60.0 in | Wt 180.7 lb

## 2018-10-16 VITALS — BP 126/74 | HR 87 | Temp 98.1°F | Ht 60.0 in | Wt 181.0 lb

## 2018-10-16 DIAGNOSIS — D508 Other iron deficiency anemias: Secondary | ICD-10-CM

## 2018-10-16 DIAGNOSIS — R601 Generalized edema: Secondary | ICD-10-CM | POA: Diagnosis not present

## 2018-10-16 DIAGNOSIS — I2583 Coronary atherosclerosis due to lipid rich plaque: Secondary | ICD-10-CM | POA: Diagnosis not present

## 2018-10-16 DIAGNOSIS — J849 Interstitial pulmonary disease, unspecified: Secondary | ICD-10-CM | POA: Diagnosis not present

## 2018-10-16 DIAGNOSIS — R188 Other ascites: Secondary | ICD-10-CM | POA: Diagnosis not present

## 2018-10-16 DIAGNOSIS — E118 Type 2 diabetes mellitus with unspecified complications: Secondary | ICD-10-CM | POA: Diagnosis not present

## 2018-10-16 DIAGNOSIS — I480 Paroxysmal atrial fibrillation: Secondary | ICD-10-CM | POA: Diagnosis not present

## 2018-10-16 DIAGNOSIS — I2510 Atherosclerotic heart disease of native coronary artery without angina pectoris: Secondary | ICD-10-CM

## 2018-10-16 DIAGNOSIS — I10 Essential (primary) hypertension: Secondary | ICD-10-CM | POA: Diagnosis not present

## 2018-10-16 DIAGNOSIS — I5032 Chronic diastolic (congestive) heart failure: Secondary | ICD-10-CM | POA: Diagnosis not present

## 2018-10-16 DIAGNOSIS — K7460 Unspecified cirrhosis of liver: Secondary | ICD-10-CM | POA: Insufficient documentation

## 2018-10-16 DIAGNOSIS — IMO0001 Reserved for inherently not codable concepts without codable children: Secondary | ICD-10-CM

## 2018-10-16 DIAGNOSIS — I959 Hypotension, unspecified: Secondary | ICD-10-CM | POA: Diagnosis not present

## 2018-10-16 DIAGNOSIS — K7581 Nonalcoholic steatohepatitis (NASH): Secondary | ICD-10-CM | POA: Insufficient documentation

## 2018-10-16 DIAGNOSIS — E1159 Type 2 diabetes mellitus with other circulatory complications: Secondary | ICD-10-CM | POA: Diagnosis not present

## 2018-10-16 DIAGNOSIS — Z794 Long term (current) use of insulin: Secondary | ICD-10-CM | POA: Diagnosis not present

## 2018-10-16 MED ORDER — METOPROLOL TARTRATE 25 MG PO TABS: 12.5000 mg | ORAL_TABLET | Freq: Two times a day (BID) | ORAL | 3 refills | Status: DC

## 2018-10-16 MED ORDER — FUROSEMIDE 40 MG PO TABS: 40.0000 mg | ORAL_TABLET | Freq: Two times a day (BID) | ORAL | 3 refills | Status: DC

## 2018-10-16 NOTE — Patient Instructions (Signed)
They will keep appt she already has in 2 motnhs

## 2018-10-16 NOTE — Progress Notes (Signed)
Impression and Recommendations:    1. Hypertension associated with diabetes (HCC)   2. Coronary artery disease due to lipid rich plaque   3. Heart failure, diastolic, chronic (HCC)   4. Cirrhosis of liver with ascites, unspecified hepatic cirrhosis type (HCC)     1. Hospital Follow-Up - Regarding pt's recent hospitalization and/or ED visit: reviewed in great detail recent hospitalization notes, clinical lab tests, tests in the radiology section of CPT, tests in the medicine testing of CPT, and obtained history from patient's daughter.  - Independent visualization of image, tracing, or specimen itself done by me today.   - Per patient's daughter, patient has an upcoming appointment with GI on March 13th, a visit with pulmonology on March 20th, and another specialty appointment on the 23rd.  Per patient's daughter, patient does not return to hematology for four months.  Hematology notes reviewed as pt was recently seen by them.   Pt understands that during acute decompensated state as she is currently in, care from the specialists is critical.   - Participated in lengthy conversation and all questions were answered.   3. Cardiac Care - CHF monitored by Cardiology - Patient returns to cardiology for follow-up in two weeks.  Per patient's daughter, the patient may have a cath or a stress test done in the near future, if change in treatment plan (discontinuing carvedilol and starting metoprolol which they did last OV) does not work.  - Patient understands that lasix, fluid management, and all blood pressure and heart-related treatment and medications will continue to come through cardiology during her acutely decompensated state.  - Extensively educated patient regarding red flag symptoms of cardiac chest pain   - Discussed difference between cardiac chest pain and GERD at length.  Educated patient that reflux comes on while lying down at night.  - Encouraged patient to eat healthy,  and stay away from salt.  - Discussed drinking adequate fluids per guidelines from cardiology, within limitations of her heart failure.  - Get up and engage in physical activity as often as possible and tolerated.   4. GI Care - Reoccurring abdominal Ascites - Advised patient to ask Dr. Dulce Sellarutlaw about her liver enzymes, and about her reported non-alcoholic cirrhosis (per daughter).  Strongly encouraged patient and daughter to ask whether patient's ascites comes from her heart, or reported liver cirrhosis, and also ask GI doc when to bring the patient to the hospital vs call their office for paracentesis etc.  - Patient and daughter know to take patient to the hospital and have her treated in the case of emergency if her stomach becomes acutely swollen again as before.  - Patient and patient's daughter know to make sure that Dr. Dulce Sellarutlaw sends his records here to the clinic after the patient's visit (around March 13th).  - Educated patient extensively regarding her health status. - Counseling was performed and all questions were answered.   Patient will call with any questions prior to using medication if they have concerns.    Expresses verbal understanding and consents to current therapy and treatment regimen.  No barriers to understanding were identified.  Red flag symptoms and signs discussed in detail.  Patient expressed understanding regarding what to do in case of emergency\urgent symptoms  Please see AVS handed out to patient at the end of our visit for further patient instructions/ counseling done pertaining to today's office visit.   Return for they will keep upcoming appt she already has.  Note:  This note was prepared with assistance of Dragon voice recognition software. Occasional wrong-word or sound-a-like substitutions may have occurred due to the inherent limitations of voice recognition software.   This document serves as a record of services personally performed by  Thomasene Lot, DO. It was created on her behalf by Peggye Fothergill, a trained medical scribe. The creation of this record is based on the scribe's personal observations and the provider's statements to them.   I have reviewed the above medical documentation for accuracy and completeness and I concur.  Thomasene Lot, DO 10/17/2018 6:37 PM        ---------------------------------------------------------------------------------------------------------------------------------    Subjective:     HPI: [name] is a 85 y.o. female who presents to [facility] today for issues as discussed below.  Patient followed up with Cardiology this morning.  Patient's lasix was recently increased to 80 for 3 days, and then decreased back to 40.   She had several liters  ( 3L ) of fluid removed in February- abdominal.  Wt has been stable thus far- checks daily.  Per Cards she has strict instructions on when to call them if wt gain occurs   Hospital Follow-Up Patient confirms that she's been through a lot recently.  States she's doing well lately and today.  Since discharge from the hospital, she's been feeling better, but still weak.    Patient's daughter states "I think she's doing a lot better, but she's starting to have an issue with retaining some fluid again."   Fluid Build-Up & Ascites Per daughter, patient was diagnosed with non-alcoholic cirrhosis of the liver and they feel that this is why pt has had ascitis.  Patient's daughter comments "they pulled out 40 lbs of fluid," and the patient's weight got down to 163 lbs.    Now the patient is back up to 181 lbs.   Her lasix was upped by Cards- currently managing it- to see if that would help with patient's fluid, and started a new medication to help with her atrial fibrillation (stopped carvedilol and started metoprolol).  Follow-Up At Home PT is coming to the house 3 times per week, and skilled nursing is  coming once per week.  Patient's memory and energy levels have improved, and she is now able to feed herself and dress herself.   Patient's daughter notes that she is able to take care of herself but they are not letting the patient be alone.  Someone is with her 24/7.   Notes her blood sugars are "all over the place."   She has been in close contact with her Endo doc- balin who is managing it.    Patient is consuming grilled chicken, protein shakes, "she's trying to give me everything healthy."  She has been pretty inactive otherwise, but diet has been good.  Patient notes she had physical therapy and her nurse appointment yesterday.  Notes the nurse told her everything was okay.   Intermittent Chest Pain Patient has had chest pain every once in a while, but not all the time.  Not exertional, not sure what makes W or B.   Notes that the chest pain comes on randomly- when just sittin garound.   No other associated pains, no current sx.   Daughter states "it mostly comes on when she has the afib thing."  Cardiology is aware of these sx - same pain present since recent hospitalization.     Hematology Follow-Up Patient  doesn't feel that the hematologist was worried at her last visit.  Was seen for pancytopenia.  Was told not to worry about plt cnt unless gets to 20-30k or less.  Was told this was likely from non-alcoholic cirrhosis.    Wt Readings from Last 3 Encounters:  10/16/18 181 lb (82.1 kg)  10/16/18 180 lb 11.2 oz (82 kg)  10/03/18 177 lb 12.8 oz (80.6 kg)   BP Readings from Last 3 Encounters:  10/16/18 126/74  10/16/18 (!) 104/50  10/03/18 (!) 113/36   Pulse Readings from Last 3 Encounters:  10/16/18 87  10/16/18 (!) 109  10/03/18 66   BMI Readings from Last 3 Encounters:  10/16/18 35.35 kg/m  10/16/18 35.29 kg/m  10/03/18 34.72 kg/m     Patient Care Team    Relationship Specialty Notifications Start End  Thomasene Lot, DO PCP - General Family Medicine  03/12/16     Kalman Shan, MD Attending Physician Pulmonary Disease Abnormal results only, Admissions 12/07/12   Nahser, Deloris Ping, MD Consulting Physician Cardiology  05/01/16   Lynann Bologna, MD Referring Physician Gastroenterology  05/01/16   Dorisann Frames, MD Consulting Physician Endocrinology  05/22/17   Shelda Pal, RN [facility] Care Management  Admissions 10/13/18   Weston Settle, MD Consulting Physician Oncology  10/16/18   Willis Modena, MD Consulting Physician Gastroenterology  10/16/18      Patient Active Problem List   Diagnosis Date Noted  . Hypertension associated with diabetes (HCC) 05/22/2017    Priority: High  . Morbidly obese (HCC)- BMI >er 35 w DM 06/23/2016    Priority: High  . Insulin dependent diabetes mellitus with complications (HCC) 03/13/2016    Priority: High  . 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 Depression 03/13/2016    Priority: Medium  . Hypothyroidism 03/13/2016    Priority: Medium  . Thrombocytopenia, unspecified (HCC) 12/13/2012    Priority: Medium  . Coronary artery disease 04/11/2011    Priority: Medium  . COPD (chronic obstructive pulmonary disease) (HCC) 04/11/2011    Priority: Medium  . Obstructive sleep apnea 04/11/2011    Priority: Medium  . Heart failure, diastolic, chronic (HCC) 03/29/2008    Priority: Medium  . Vitamin D deficiency 05/11/2016    Priority: Low  . Atherosclerosis of carotid arteries 04/03/2006    Priority: Low  . Cirrhosis of liver with ascites (HCC) 10/16/2018  . Pancytopenia (HCC) 09/26/2018  . COPD exacerbation (HCC) 09/26/2018  . Acute on chronic diastolic CHF (congestive heart failure) (HCC) 09/26/2018  . Acute on chronic respiratory failure with hypoxia (HCC) 09/26/2018  . PNA (pneumonia) 09/15/2018  . Microalbuminuria due to type 2 diabetes mellitus (HCC) 08/26/2018  . Hypocalcemia 03/12/2018  . Gastroesophageal reflux  disease 12/31/2017  . chronic Low platelet count 06/05/2017  . chronic Leukopenia 05/11/2016  . Elevated serum alkaline phosphatase level- hepatocellular in origin 05/11/2016  . Constipation, chronic 03/13/2016  . At high risk for osteoporosis 03/13/2016  . Hypotension 12/13/2012  . Hypokalemia 12/13/2012  . h/o Clostridium difficile colitis 12/12/2012  . History of GI bleed 12/12/2012  . ILD (interstitial lung disease) (HCC) 11/25/2012    Past Medical history, Surgical history, Family history, Social history, Allergies and Medications have been entered into the medical record, reviewed and changed as needed.    Current Meds  Medication Sig  . albuterol (PROAIR HFA) 108 (90 Base) MCG/ACT inhaler Inhale 2 puffs into the lungs  every 6 (six) hours as needed for wheezing or shortness of breath.  . Blood Glucose Monitoring Suppl (FREESTYLE LITE) DEVI Use to check blood sugars 2-3 times daily  . Carboxymeth-Glyc-Polysorb PF (REFRESH OPTIVE MEGA-3) 0.5-1-0.5 % SOLN Apply 1-2 drops to eye 2 (two) times daily.   . Cholecalciferol (VITAMIN D3) 50 MCG (2000 UT) TABS Take 1 tablet by mouth daily.  Marland Kitchen EASY TOUCH PEN NEEDLES 31G X 5 MM MISC USE TO INJECT INSULIN 5 TIMES A DAY  . escitalopram (LEXAPRO) 20 MG tablet TAKE 1 TABLET (20 MG TOTAL) BY MOUTH DAILY.  Marland Kitchen Fluticasone-Salmeterol (ADVAIR) 250-50 MCG/DOSE AEPB Inhale 1 puff into the lungs 2 (two) times daily.  . furosemide (LASIX) 40 MG tablet Take 1 tablet (40 mg total) by mouth 2 (two) times daily.  . insulin aspart (NOVOLOG FLEXPEN) 100 UNIT/ML FlexPen Inject 10 Units into the skin 3 (three) times daily with meals.  . Insulin Glargine (LANTUS SOLOSTAR) 100 UNIT/ML Solostar Pen Inject 35 Units into the skin 2 (two) times daily.  Marland Kitchen levothyroxine (SYNTHROID, LEVOTHROID) 100 MCG tablet Take 1 tablet (100 mcg total) by mouth daily.  . metoprolol tartrate (LOPRESSOR) 25 MG tablet Take 0.5 tablets (12.5 mg total) by mouth 2 (two) times daily.  .  montelukast (SINGULAIR) 10 MG tablet TAKE 1 TABLET BY MOUTH EVERY EVENING. (Patient taking differently: Take 10 mg by mouth daily. )  . Multiple Vitamins-Minerals (VISION FORMULA/LUTEIN) TABS Take 1 tablet by mouth daily.  . nitroGLYCERIN (NITROSTAT) 0.4 MG SL tablet Place 1 tab under the tongue every 5 minutes as needed for chest pain. Please schedule appt for further refills. 3rd/final attempt.  . ONE TOUCH ULTRA TEST test strip TEST BLOOD SUGAR 2-3 TIMES A DAY  . ONETOUCH DELICA LANCETS 33G MISC USE TO CHECK BLOOD SUGAR THREE TIMES A DAY  . pantoprazole (PROTONIX) 20 MG tablet Take 1 tablet (20 mg total) by mouth 2 (two) times daily. (Patient taking differently: Take 20 mg by mouth daily. )  . Polyethyl Glycol-Propyl Glycol (SYSTANE) 0.4-0.3 % SOLN Apply 1 drop to eye 4 (four) times daily.   Marland Kitchen Propylene Glycol (SYSTANE BALANCE) 0.6 % SOLN Apply 1 drop to eye 2 (two) times daily.  Marland Kitchen spironolactone (ALDACTONE) 25 MG tablet Take 2 tablets (50 mg total) by mouth daily.  . White Petrolatum-Mineral Oil (GENTEAL TEARS NIGHT-TIME) OINT Apply 1 application to eye at bedtime.  Cliffton Asters Petrolatum-Mineral Oil (SYSTANE NIGHTTIME) OINT Place 1 application into both eyes at bedtime as needed (irritation).     Allergies:  Allergies  Allergen Reactions  . Ace Inhibitors Cough  . Benadryl [Diphenhydramine Hcl (Sleep)] Other (See Comments)    hypotension  . Penicillins Hives and Swelling    Swelling of arms Has patient had a PCN reaction causing immediate rash, facial/tongue/throat swelling, SOB or lightheadedness with hypotension: unknown Has patient had a PCN reaction causing severe rash involving mucus membranes or skin necrosis: no Has patient had a PCN reaction that required hospitalization: unknown Has patient had a PCN reaction occurring within the last 10 years: no, childhood allergy If all of the above answers are "NO", then may proceed with Cephalosporin use.   . Sulfonamide Derivatives Swelling      Review of Systems:  A fourteen system review of systems was performed and found to be positive as per HPI.   Objective:   Blood pressure 126/74, pulse 87, temperature 98.1 F (36.7 C), height 5' (1.524 m), weight 181 lb (82.1 kg), SpO2 95 %.  Body mass index is 35.35 kg/m. General:  Well Developed, well nourished, appropriate for stated age.  Neuro:  Alert and oriented,  extra-ocular muscles intact  HEENT:  Normocephalic, atraumatic, neck supple, no carotid bruits appreciated  Skin:  no gross rash, warm, pink. Cardiac:  RRR in office today, S1 S2 Respiratory:  A few crackles at the bilateral bases of the lungs, but otherwise ECTA B/L and A/P, Not using accessory muscles, speaking in full sentences- unlabored. Vascular:  Ext warm, no cyanosis apprec.; cap RF less 2 sec. Psych:  No HI/SI, judgement and insight good, Euthymic mood. Full Affect.

## 2018-10-16 NOTE — Patient Instructions (Addendum)
Medication Instructions:  1.) STOP: Carvedilol  2.) START: Metoprolol 12.5 mg twice a day  3.) INCREASE: Lasix to 40 mg twice a day   If you need a refill on your cardiac medications before your next appointment, please call your pharmacy.   Lab work: TODAY: BMET  If you have labs (blood work) drawn today and your tests are completely normal, you will receive your results only by: Marland Kitchen MyChart Message (if you have MyChart) OR . A paper copy in the mail If you have any lab test that is abnormal or we need to change your treatment, we will call you to review the results.  Testing/Procedures: None  Follow-Up: Follow up with Lizabeth Leyden, NP on 10/28/2018 @ 11:00 AM  Any Other Special Instructions Will Be Listed Below (If Applicable).

## 2018-10-17 ENCOUNTER — Other Ambulatory Visit: Payer: Self-pay | Admitting: *Deleted

## 2018-10-17 ENCOUNTER — Ambulatory Visit: Payer: PPO | Admitting: *Deleted

## 2018-10-17 LAB — BASIC METABOLIC PANEL
BUN/Creatinine Ratio: 29 — ABNORMAL HIGH (ref 12–28)
BUN: 21 mg/dL (ref 8–27)
CO2: 25 mmol/L (ref 20–29)
Calcium: 9 mg/dL (ref 8.7–10.3)
Chloride: 98 mmol/L (ref 96–106)
Creatinine, Ser: 0.73 mg/dL (ref 0.57–1.00)
GFR calc Af Amer: 89 mL/min/{1.73_m2} (ref >59)
GFR calc non Af Amer: 77 mL/min/{1.73_m2} (ref >59)
Glucose: 296 mg/dL — ABNORMAL HIGH (ref 65–99)
Potassium: 4.4 mmol/L (ref 3.5–5.2)
Sodium: 138 mmol/L (ref 134–144)

## 2018-10-17 NOTE — Patient Outreach (Addendum)
[facility]) Care Management  10/17/2018  [name] 09-22-1933 [id number]   Subjective: Received voicemail from [facility], states she is returning call, and requested call back. Telephone call to patient's home number, no answer, left HIPAA compliant voicemail message, and requested call back. Telephone call from patient's home number, spoke with patient, and HIPAA verified.  Discussed [facility] Care Management [employer] EMMI COPD follow up, patient voiced understanding, and is in agreement to follow up.    States she is currently staying with daughter and the call dropped. Telephone call to patient's home  number, no answer, left HIPAA compliant voicemail message, and requested call back. Telephone call to patient's home  number, no answer, call went straight to voicemail, and no message left.      Objective:Per KPN (Knowledge Performance Now, point of care tool) and chart review,patient hospitalized 09/26/2018 -10/03/2018 for acute respiratory failure. Patient also hospitalized 09/15/2018 -09/17/2018 for pneumonia. Patient has a history of COPD, diabetes, Coronary artery disease,ILD (interstitial lung disease),Pancytopenia, andAcute on chronic diastolic CHF (congestive heart failure).    Assessment: Received [employer] EMMI COPD Red flag alerts Times 5, follow up referral on 10/13/2018, 10/14/2018, and 09/27/2018. Red flag alert triggers, Day # 8, Day # 5, Day #3, patient answered yes to the following questions: Feeling worse overall?Harder to breathe?More mucus or thicker mucus?Mucus change color?Questions/problems with meds?Patient has also used rescue inhaler 2 times in the past 24 hours as of 10/13/2018.Patient answered 10 to the following question: # of times rescue inhaler used in past 24 hours?   [facility] EMMI follow up pending patient contact.    Plan:RNCM has sent unsuccessful outreach letter, [facility] pamphlet, handout: Know Before You  Go, and will  proceed with case closure, within 10 business days if no return call.     Rylen Hou H. Gardiner Barefoot, BSN, CCM [facility] Care Management [facility] Telephonic CM Phone: [phone] Fax: [phone]

## 2018-10-18 DIAGNOSIS — J449 Chronic obstructive pulmonary disease, unspecified: Secondary | ICD-10-CM | POA: Diagnosis not present

## 2018-10-20 ENCOUNTER — Other Ambulatory Visit: Payer: Self-pay | Admitting: *Deleted

## 2018-10-20 NOTE — Patient Outreach (Addendum)
[facility]) Care Management  10/20/2018  [name] July 31, 1934 [id number]   Subjective: Received voicemail from [facility], states she is returning call, and requested call back. Telephone call to patient's home number, no answer, left HIPAA compliant voicemail message, and requested call back. Telephone call to patient's mobile number, no answer, left HIPAA compliant voicemail message, and requested call back.      Objective:Per KPN (Knowledge Performance Now, point of care tool) and chart review,patient hospitalized 09/26/2018 -10/03/2018 for acute respiratory failure. Patient also hospitalized 09/15/2018 -09/17/2018 for pneumonia. Patient has a history of COPD, diabetes, Coronary artery disease,ILD (interstitial lung disease),Pancytopenia, andAcute on chronic diastolic CHF (congestive heart failure).    Assessment: Received [employer] EMMI COPD Red flag alerts Times 5, follow up referral on 10/13/2018, 10/14/2018,  10/17/2018 and 10/20/2018. Red flag alert triggers, Day #11, Day # 8, Day # 5, Day #3, patient answered yes to the following questions: Feeling worse overall?Harder to breathe?More mucus or thicker mucus?Mucus change color?Questions/problems with meds?Patient has also used rescue inhaler 2 times in the past 24 hours as of 10/13/2018.Patient answered 10 to the following question: # of times rescue inhaler used in past 24 hours?   Patient answered yes to the following questions: Feeling worse overall?  More mucus or thicker mucus? Mucus change color?  [facility] EMMI follow up pending patient contact.    Plan:RNCMhassentunsuccessful outreach letter, [facility] pamphlet, handout: Know Before You Go, and will  proceed with case closure, within 10 business days if no return call.     [name] H. Gardiner Barefoot, BSN, CCM [facility] Care Management [facility] Telephonic CM Phone: [phone] Fax: [phone]

## 2018-10-21 ENCOUNTER — Telehealth: Payer: Self-pay | Admitting: *Deleted

## 2018-10-21 DIAGNOSIS — R2689 Other abnormalities of gait and mobility: Secondary | ICD-10-CM | POA: Diagnosis not present

## 2018-10-21 DIAGNOSIS — E669 Obesity, unspecified: Secondary | ICD-10-CM | POA: Diagnosis not present

## 2018-10-21 DIAGNOSIS — J449 Chronic obstructive pulmonary disease, unspecified: Secondary | ICD-10-CM | POA: Diagnosis not present

## 2018-10-21 DIAGNOSIS — E039 Hypothyroidism, unspecified: Secondary | ICD-10-CM | POA: Diagnosis not present

## 2018-10-21 DIAGNOSIS — I252 Old myocardial infarction: Secondary | ICD-10-CM | POA: Diagnosis not present

## 2018-10-21 DIAGNOSIS — I2510 Atherosclerotic heart disease of native coronary artery without angina pectoris: Secondary | ICD-10-CM | POA: Diagnosis not present

## 2018-10-21 DIAGNOSIS — K7460 Unspecified cirrhosis of liver: Secondary | ICD-10-CM | POA: Diagnosis not present

## 2018-10-21 DIAGNOSIS — D6959 Other secondary thrombocytopenia: Secondary | ICD-10-CM | POA: Diagnosis not present

## 2018-10-21 DIAGNOSIS — Z7951 Long term (current) use of inhaled steroids: Secondary | ICD-10-CM | POA: Diagnosis not present

## 2018-10-21 DIAGNOSIS — D61818 Other pancytopenia: Secondary | ICD-10-CM | POA: Diagnosis not present

## 2018-10-21 DIAGNOSIS — E785 Hyperlipidemia, unspecified: Secondary | ICD-10-CM | POA: Diagnosis not present

## 2018-10-21 DIAGNOSIS — E1140 Type 2 diabetes mellitus with diabetic neuropathy, unspecified: Secondary | ICD-10-CM | POA: Diagnosis not present

## 2018-10-21 DIAGNOSIS — Z794 Long term (current) use of insulin: Secondary | ICD-10-CM | POA: Diagnosis not present

## 2018-10-21 DIAGNOSIS — I110 Hypertensive heart disease with heart failure: Secondary | ICD-10-CM | POA: Diagnosis not present

## 2018-10-21 DIAGNOSIS — K219 Gastro-esophageal reflux disease without esophagitis: Secondary | ICD-10-CM | POA: Diagnosis not present

## 2018-10-21 DIAGNOSIS — D638 Anemia in other chronic diseases classified elsewhere: Secondary | ICD-10-CM | POA: Diagnosis not present

## 2018-10-21 DIAGNOSIS — G4733 Obstructive sleep apnea (adult) (pediatric): Secondary | ICD-10-CM | POA: Diagnosis not present

## 2018-10-21 DIAGNOSIS — J849 Interstitial pulmonary disease, unspecified: Secondary | ICD-10-CM | POA: Diagnosis not present

## 2018-10-21 DIAGNOSIS — J9611 Chronic respiratory failure with hypoxia: Secondary | ICD-10-CM | POA: Diagnosis not present

## 2018-10-21 DIAGNOSIS — I700 Atherosclerosis of aorta: Secondary | ICD-10-CM | POA: Diagnosis not present

## 2018-10-21 DIAGNOSIS — F329 Major depressive disorder, single episode, unspecified: Secondary | ICD-10-CM | POA: Diagnosis not present

## 2018-10-21 DIAGNOSIS — I5032 Chronic diastolic (congestive) heart failure: Secondary | ICD-10-CM | POA: Diagnosis not present

## 2018-10-21 DIAGNOSIS — Z6835 Body mass index (BMI) 35.0-35.9, adult: Secondary | ICD-10-CM | POA: Diagnosis not present

## 2018-10-21 DIAGNOSIS — K7581 Nonalcoholic steatohepatitis (NASH): Secondary | ICD-10-CM | POA: Diagnosis not present

## 2018-10-21 DIAGNOSIS — Z955 Presence of coronary angioplasty implant and graft: Secondary | ICD-10-CM | POA: Diagnosis not present

## 2018-10-21 NOTE — Telephone Encounter (Signed)
Called pt re: lab results, left a message for pt to call back.  

## 2018-10-21 NOTE — Telephone Encounter (Signed)
Follow up    Patients daughter [name] is returning call in reference to lab results. She did state she reviewed the lab results online and no call back is needed unless its to tell her something additional.

## 2018-10-21 NOTE — Telephone Encounter (Signed)
Returned pts call and she has been made aware of her lab results. See result note. 

## 2018-10-21 NOTE — Telephone Encounter (Signed)
-----   Message from Leone Brand, NP sent at 10/17/2018 11:52 AM EST ----- Labs are good,  Though her glucose was elevated.  Keep follow up appt.

## 2018-10-27 ENCOUNTER — Other Ambulatory Visit: Payer: Self-pay | Admitting: *Deleted

## 2018-10-27 NOTE — Patient Outreach (Signed)
[facility]) Care Management  10/27/2018  [name] 1933/12/29 [id number]   No response from patient outreach attempts will proceed with case closure.     Objective:Per KPN (Knowledge Performance Now, point of care tool) and chart review,patient hospitalized 09/26/2018 -10/03/2018 for acute respiratory failure. Patient also hospitalized 09/15/2018 -09/17/2018 for pneumonia. Patient has a history of COPD, diabetes, Coronary artery disease,ILD (interstitial lung disease),Pancytopenia, andAcute on chronic diastolic CHF (congestive heart failure).    Assessment: Received [employer] EMMI COPD Red flag alerts Times 5, follow up referral on 10/13/2018, 10/14/2018,  10/17/2018 and 10/20/2018.Red flag alert triggers, Day #11, Day # 8,Day # 5, Day #3, patient answered yes to the following questions: Feeling worse overall?Harder to breathe?More mucus or thicker mucus?Mucus change color?Questions/problems with meds?Patient has also used rescue inhaler 2 times in the past 24 hours as of 10/13/2018.Patient answered 10 to the following question:# of times rescue inhaler used in past 24 hours?Patient answered yes to the following questions: Feeling worse overall?  More mucus or thicker mucus? Mucus change color?  EMMI follow up not completed due to unable to contact patient and will proceed with case closure.      Plan:Case closure due to unable to reach.      Quintasia Theroux H. Gardiner Barefoot, BSN, CCM [facility] Care Management [facility] Telephonic CM Phone: [phone] Fax: [phone]

## 2018-10-28 ENCOUNTER — Ambulatory Visit (INDEPENDENT_AMBULATORY_CARE_PROVIDER_SITE_OTHER): Payer: PPO | Admitting: Cardiology

## 2018-10-28 ENCOUNTER — Encounter: Payer: Self-pay | Admitting: Cardiology

## 2018-10-28 VITALS — BP 108/60 | HR 64 | Ht 60.0 in | Wt 180.1 lb

## 2018-10-28 DIAGNOSIS — E039 Hypothyroidism, unspecified: Secondary | ICD-10-CM | POA: Diagnosis not present

## 2018-10-28 DIAGNOSIS — K7460 Unspecified cirrhosis of liver: Secondary | ICD-10-CM | POA: Diagnosis not present

## 2018-10-28 DIAGNOSIS — D696 Thrombocytopenia, unspecified: Secondary | ICD-10-CM

## 2018-10-28 DIAGNOSIS — E118 Type 2 diabetes mellitus with unspecified complications: Secondary | ICD-10-CM

## 2018-10-28 DIAGNOSIS — I480 Paroxysmal atrial fibrillation: Secondary | ICD-10-CM | POA: Diagnosis not present

## 2018-10-28 DIAGNOSIS — G4733 Obstructive sleep apnea (adult) (pediatric): Secondary | ICD-10-CM | POA: Diagnosis not present

## 2018-10-28 DIAGNOSIS — I5042 Chronic combined systolic (congestive) and diastolic (congestive) heart failure: Secondary | ICD-10-CM

## 2018-10-28 DIAGNOSIS — E7800 Pure hypercholesterolemia, unspecified: Secondary | ICD-10-CM | POA: Diagnosis not present

## 2018-10-28 DIAGNOSIS — IMO0001 Reserved for inherently not codable concepts without codable children: Secondary | ICD-10-CM

## 2018-10-28 DIAGNOSIS — R188 Other ascites: Secondary | ICD-10-CM | POA: Diagnosis not present

## 2018-10-28 DIAGNOSIS — I2510 Atherosclerotic heart disease of native coronary artery without angina pectoris: Secondary | ICD-10-CM

## 2018-10-28 DIAGNOSIS — Z794 Long term (current) use of insulin: Secondary | ICD-10-CM | POA: Diagnosis not present

## 2018-10-28 DIAGNOSIS — E1165 Type 2 diabetes mellitus with hyperglycemia: Secondary | ICD-10-CM | POA: Diagnosis not present

## 2018-10-28 LAB — BASIC METABOLIC PANEL
BUN: 15 (ref 4–21)
Creatinine: 0.9 (ref 0.5–1.1)
Glucose: 109
Potassium: 4.7 (ref 3.4–5.3)
Sodium: 144 (ref 137–147)

## 2018-10-28 LAB — HEPATIC FUNCTION PANEL
ALT: 40 — AB (ref 7–35)
AST: 36 — AB (ref 13–35)
Alkaline Phosphatase: 226 — AB (ref 25–125)

## 2018-10-28 LAB — LIPID PANEL
Cholesterol: 184 (ref 0–200)
HDL: 61 (ref 35–70)
LDL Cholesterol: 96
Triglycerides: 135 (ref 40–160)

## 2018-10-28 LAB — HEMOGLOBIN A1C: Hemoglobin A1C: 7.7

## 2018-10-28 LAB — TSH: TSH: 1.17 (ref 0.41–5.90)

## 2018-10-28 NOTE — Patient Instructions (Addendum)
Medication Instructions:  Your physician recommends that you continue on your current medications as directed. Please refer to the Current Medication list given to you today.  If you need a refill on your cardiac medications before your next appointment, please call your pharmacy.   Lab work: None  If you have labs (blood work) drawn today and your tests are completely normal, you will receive your results only by: Marland Kitchen MyChart Message (if you have MyChart) OR . A paper copy in the mail If you have any lab test that is abnormal or we need to change your treatment, we will call you to review the results.  Testing/Procedures: None  Follow-Up: At [facility], you and your health needs are our priority.  As part of our continuing mission to provide you with exceptional heart care, we have created designated Provider Care Teams.  These Care Teams include your primary Cardiologist (physician) and Advanced Practice Providers (APPs -  Physician Assistants and Nurse Practitioners) who all work together to provide you with the care you need, when you need it. You will need a follow up appointment in:  3-4 months.  Please call our office 2 months in advance to schedule this appointment.  You may see Kristeen Miss, MD or one of the following Advanced Practice Providers on your designated Care Team: Tereso Newcomer, PA-C [place], [place] . Berton Bon, NP  Any Other Special Instructions Will Be Listed Below (If Applicable).  DASH Eating Plan DASH stands for "Dietary Approaches to Stop Hypertension." The DASH eating plan is a healthy eating plan that has been shown to reduce high blood pressure (hypertension). It may also reduce your risk for type 2 diabetes, heart disease, and stroke. The DASH eating plan may also help with weight loss. What are tips for following this plan?  General guidelines  Avoid eating more than 2,300 mg (milligrams) of salt (sodium) a day. If you have hypertension, you may  need to reduce your sodium intake to 1,500 mg a day.  Limit alcohol intake to no more than 1 drink a day for nonpregnant women and 2 drinks a day for men. One drink equals 12 oz of beer, 5 oz of wine, or 1 oz of hard liquor.  Work with your health care provider to maintain a healthy body weight or to lose weight. Ask what an ideal weight is for you.  Get at least 30 minutes of exercise that causes your heart to beat faster (aerobic exercise) most days of the week. Activities may include walking, swimming, or biking.  Work with your health care provider or diet and nutrition specialist (dietitian) to adjust your eating plan to your individual calorie needs. Reading food labels   Check food labels for the amount of sodium per serving. Choose foods with less than 5 percent of the Daily Value of sodium. Generally, foods with less than 300 mg of sodium per serving fit into this eating plan.  To find whole grains, look for the word "whole" as the first word in the ingredient list. Shopping  Buy products labeled as "low-sodium" or "no salt added."  Buy fresh foods. Avoid canned foods and premade or frozen meals. Cooking  Avoid adding salt when cooking. Use salt-free seasonings or herbs instead of table salt or sea salt. Check with your health care provider or pharmacist before using salt substitutes.  Do not fry foods. Cook foods using healthy methods such as baking, boiling, grilling, and broiling instead.  Cook with heart-healthy oils,  oils, such as olive, canola, soybean, or sunflower oil. Meal planning  Eat a balanced diet that includes: ? 5 or more servings of fruits and vegetables each day. At each meal, try to fill half of your plate with fruits and vegetables. ? Up to 6-8 servings of whole grains each day. ? Less than 6 oz of lean meat, poultry, or fish each day. A 3-oz serving of meat is about the same size as a deck of cards. One egg equals 1 oz. ? 2 servings of low-fat dairy each day.  ? A serving of nuts, seeds, or beans 5 times each week. ? Heart-healthy fats. Healthy fats called Omega-3 fatty acids are found in foods such as flaxseeds and coldwater fish, like sardines, salmon, and mackerel.  Limit how much you eat of the following: ? Canned or prepackaged foods. ? Food that is high in trans fat, such as fried foods. ? Food that is high in saturated fat, such as fatty meat. ? Sweets, desserts, sugary drinks, and other foods with added sugar. ? Full-fat dairy products.  Do not salt foods before eating.  Try to eat at least 2 vegetarian meals each week.  Eat more home-cooked food and less restaurant, buffet, and fast food.  When eating at a restaurant, ask that your food be prepared with less salt or no salt, if possible. What foods are recommended? The items listed may not be a complete list. Talk with your dietitian about what dietary choices are best for you. Grains Whole-grain or whole-wheat bread. Whole-grain or whole-wheat pasta. Brown rice. Oatmeal. Quinoa. Bulgur. Whole-grain and low-sodium cereals. Pita bread. Low-fat, low-sodium crackers. Whole-wheat flour tortillas. Vegetables Fresh or frozen vegetables (raw, steamed, roasted, or grilled). Low-sodium or reduced-sodium tomato and vegetable juice. Low-sodium or reduced-sodium tomato sauce and tomato paste. Low-sodium or reduced-sodium canned vegetables. Fruits All fresh, dried, or frozen fruit. Canned fruit in natural juice (without added sugar). Meat and other protein foods Skinless chicken or turkey. Ground chicken or turkey. Pork with fat trimmed off. Fish and seafood. Egg whites. Dried beans, peas, or lentils. Unsalted nuts, nut butters, and seeds. Unsalted canned beans. Lean cuts of beef with fat trimmed off. Low-sodium, lean deli meat. Dairy Low-fat (1%) or fat-free (skim) milk. Fat-free, low-fat, or reduced-fat cheeses. Nonfat, low-sodium ricotta or cottage cheese. Low-fat or nonfat yogurt. Low-fat,  low-sodium cheese. Fats and oils Soft margarine without trans fats. Vegetable oil. Low-fat, reduced-fat, or light mayonnaise and salad dressings (reduced-sodium). Canola, safflower, olive, soybean, and sunflower oils. Avocado. Seasoning and other foods Herbs. Spices. Seasoning mixes without salt. Unsalted popcorn and pretzels. Fat-free sweets. What foods are not recommended? The items listed may not be a complete list. Talk with your dietitian about what dietary choices are best for you. Grains Baked goods made with fat, such as croissants, muffins, or some breads. Dry pasta or rice meal packs. Vegetables Creamed or fried vegetables. Vegetables in a cheese sauce. Regular canned vegetables (not low-sodium or reduced-sodium). Regular canned tomato sauce and paste (not low-sodium or reduced-sodium). Regular tomato and vegetable juice (not low-sodium or reduced-sodium). Pickles. Olives. Fruits Canned fruit in a light or heavy syrup. Fried fruit. Fruit in cream or butter sauce. Meat and other protein foods Fatty cuts of meat. Ribs. Fried meat. Bacon. Sausage. Bologna and other processed lunch meats. Salami. Fatback. Hotdogs. Bratwurst. Salted nuts and seeds. Canned beans with added salt. Canned or smoked fish. Whole eggs or egg yolks. Chicken or turkey with skin. Dairy Whole or 2% milk,   cream, and half-and-half. Whole or full-fat cream cheese. Whole-fat or sweetened yogurt. Full-fat cheese. Nondairy creamers. Whipped toppings. Processed cheese and cheese spreads. Fats and oils Butter. Stick margarine. Lard. Shortening. Ghee. Bacon fat. Tropical oils, such as coconut, palm kernel, or palm oil. Seasoning and other foods Salted popcorn and pretzels. Onion salt, garlic salt, seasoned salt, table salt, and sea salt. Worcestershire sauce. Tartar sauce. Barbecue sauce. Teriyaki sauce. Soy sauce, including reduced-sodium. Steak sauce. Canned and packaged gravies. Fish sauce. Oyster sauce. Cocktail sauce.  Horseradish that you find on the shelf. Ketchup. Mustard. Meat flavorings and tenderizers. Bouillon cubes. Hot sauce and Tabasco sauce. Premade or packaged marinades. Premade or packaged taco seasonings. Relishes. Regular salad dressings. Where to find more information:  National Heart, Lung, and Blood Institute: www.nhlbi.nih.gov  American Heart Association: www.heart.org Summary  The DASH eating plan is a healthy eating plan that has been shown to reduce high blood pressure (hypertension). It may also reduce your risk for type 2 diabetes, heart disease, and stroke.  With the DASH eating plan, you should limit salt (sodium) intake to 2,300 mg a day. If you have hypertension, you may need to reduce your sodium intake to 1,500 mg a day.  When on the DASH eating plan, aim to eat more fresh fruits and vegetables, whole grains, lean proteins, low-fat dairy, and heart-healthy fats.  Work with your health care provider or diet and nutrition specialist (dietitian) to adjust your eating plan to your individual calorie needs. This information is not intended to replace advice given to you by your health care provider. Make sure you discuss any questions you have with your health care provider. Document Released: 07/26/2011 Document Revised: 07/30/2016 Document Reviewed: 07/30/2016 [employer] Interactive Patient Education  2019 [employer]  

## 2018-10-28 NOTE — Progress Notes (Signed)
Cardiology Office Note:    Date:  10/28/2018   ID:  [name], DOB 03-Jun-1934, MRN [id number]  PCP:  Thomasene Lot, DO  Cardiologist:  Kristeen Miss, MD  Referring MD: Thomasene Lot, DO   Chief Complaint  Patient presents with  . Hospitalization Follow-up    edema    History of Present Illness:    [name] is a 85 y.o. female with a past medical history significant for CAD status post inferior wall MI and stent to RCA, later stent to LAD, COPD on home oxygen, interstitial lung disease, chronic diastolic heart failure, IDDM, morbid obesity, GERD, hypothyroidism, OSA not on CPAP, nonalcoholic cirrhosis, history of SVT.  The patient was hospitalized 09/17/2018 for COPD exacerbation and pneumonia and also 10/03/2018 for acute on chronic respiratory failure.  The patient has had massive ascites requiring paracentesis and was given albumin.  She is on Lasix and Aldactone.  She also has anemia and thrombocytopenia.  She had A. fib during hospitalization.  She has been deemed to be a poor anticoagulation candidate for this reason.  She was last seen in the office on 10/16/2018 by Nada Boozer, NP.  She was maintaining sinus rhythm.  She was noted to have a small weight gain.  She was continued on 40 mg Lasix twice daily.  [name] is here today for follow-up of her weight gain and edema. Her home wts have been stable within 2-3 pounds. She has no peripheral edema. Per the pt her abdomen has not been getting bigger since the paracentesis. She has had no orthopnea, PND, chest pain/pressure. She has occ mild lightheadedness when rising up after laying down. This clears quickly. No falls.   She has been doing home PT, last day is today. She feels that she is getting stronger and feels well. She walks back and forth on her daughter's long porch. Her daughter is concerned about the patient sleeping a lot and sometimes says she feels "wiped out". She wonders if this is related to medications as  pt was switched from carvedilol to metoprolol. The patient says that this does not occur every day. She thinks it is because she has not been doing much.   Wt 174-177 at home.   She has appt with [facility] on Friday.   Past Medical History:  Diagnosis Date  . Cirrhosis, non-alcoholic (HCC)   . Coronary artery disease    status post inferior wall myocardial infarction  . Diabetes mellitus   . Dyslipidemia   . Hypotension 08/02/2010   recent episodes of orthostatic hypotension  . Hypothyroidism   . Leg edema   . Myocardial infarct, old   . Neuropathy of hand    left hand numbness/neuropathy  . Obesity   . Sleep apnea     Past Surgical History:  Procedure Laterality Date  . ABDOMINAL HYSTERECTOMY    . CARDIAC CATHETERIZATION     The ejection fraction is around 50%  . CHOLECYSTECTOMY    . CORONARY STENT PLACEMENT    . ESOPHAGOGASTRODUODENOSCOPY N/A 12/12/2012   Procedure: ESOPHAGOGASTRODUODENOSCOPY (EGD);  Surgeon: Florencia Reasons, MD;  Location: [facility];  Service: Endoscopy;  Laterality: N/A;  . FLEXIBLE SIGMOIDOSCOPY N/A 12/12/2012   Procedure: FLEXIBLE SIGMOIDOSCOPY;  Surgeon: Florencia Reasons, MD;  Location: [facility];  Service: Endoscopy;  Laterality: N/A;  . IR PARACENTESIS  09/29/2018  . IR PARACENTESIS  10/01/2018  . TUBAL LIGATION    . WRIST SURGERY      Current  Medications: Current Meds  Medication Sig  . albuterol (PROAIR HFA) 108 (90 Base) MCG/ACT inhaler Inhale 2 puffs into the lungs every 6 (six) hours as needed for wheezing or shortness of breath.  . Blood Glucose Monitoring Suppl (FREESTYLE LITE) DEVI Use to check blood sugars 2-3 times daily  . Carboxymeth-Glyc-Polysorb PF (REFRESH OPTIVE MEGA-3) 0.5-1-0.5 % SOLN Apply 1-2 drops to eye 2 (two) times daily.   . Cholecalciferol (VITAMIN D3) 50 MCG (2000 UT) TABS Take 1 tablet by mouth daily.  Marland Kitchen. EASY TOUCH PEN NEEDLES 31G X 5 MM MISC USE TO INJECT INSULIN 5 TIMES A DAY  . escitalopram (LEXAPRO) 20 MG  tablet TAKE 1 TABLET (20 MG TOTAL) BY MOUTH DAILY.  Marland Kitchen. Fluticasone-Salmeterol (ADVAIR) 250-50 MCG/DOSE AEPB Inhale 1 puff into the lungs 2 (two) times daily.  . furosemide (LASIX) 40 MG tablet Take 1 tablet (40 mg total) by mouth 2 (two) times daily.  . insulin aspart (NOVOLOG FLEXPEN) 100 UNIT/ML FlexPen Inject 10 Units into the skin 3 (three) times daily with meals.  . Insulin Glargine (LANTUS SOLOSTAR) 100 UNIT/ML Solostar Pen Inject 35 Units into the skin 2 (two) times daily.  Marland Kitchen. levothyroxine (SYNTHROID, LEVOTHROID) 100 MCG tablet Take 1 tablet (100 mcg total) by mouth daily.  . metoprolol tartrate (LOPRESSOR) 25 MG tablet Take 0.5 tablets (12.5 mg total) by mouth 2 (two) times daily.  . montelukast (SINGULAIR) 10 MG tablet TAKE 1 TABLET BY MOUTH EVERY EVENING.  . Multiple Vitamins-Minerals (VISION FORMULA/LUTEIN) TABS Take 1 tablet by mouth daily.  . nitroGLYCERIN (NITROSTAT) 0.4 MG SL tablet Place 1 tab under the tongue every 5 minutes as needed for chest pain. Please schedule appt for further refills. 3rd/final attempt.  . ONE TOUCH ULTRA TEST test strip TEST BLOOD SUGAR 2-3 TIMES A DAY  . ONETOUCH DELICA LANCETS 33G MISC USE TO CHECK BLOOD SUGAR THREE TIMES A DAY  . pantoprazole (PROTONIX) 20 MG tablet Take 1 tablet (20 mg total) by mouth 2 (two) times daily.  [name]. Polyethyl Glycol-Propyl Glycol (SYSTANE) 0.4-0.3 % SOLN Apply 1 drop to eye 4 (four) times daily.   Marland Kitchen. Propylene Glycol (SYSTANE BALANCE) 0.6 % SOLN Apply 1 drop to eye 2 (two) times daily.  Marland Kitchen. spironolactone (ALDACTONE) 25 MG tablet Take 2 tablets (50 mg total) by mouth daily.  . White Petrolatum-Mineral Oil (GENTEAL TEARS NIGHT-TIME) OINT Apply 1 application to eye at bedtime.  Cliffton Asters. White Petrolatum-Mineral Oil (SYSTANE NIGHTTIME) OINT Place 1 application into both eyes at bedtime as needed (irritation).      Allergies:   Ace inhibitors; Benadryl [diphenhydramine hcl (sleep)]; Penicillins; and Sulfonamide derivatives   Social  History   Socioeconomic History  . Marital status: Widowed    Spouse name: Not on file  . Number of children: Not on file  . Years of education: Not on file  . Highest education level: Not on file  Occupational History  . Not on file  Social Needs  . Financial resource strain: Not on file  . Food insecurity:    Worry: Not on file    Inability: Not on file  . Transportation needs:    Medical: Not on file    Non-medical: Not on file  Tobacco Use  . Smoking status: Former Smoker    Last attempt to quit: 08/20/1973    Years since quitting: 45.2  . Smokeless tobacco: Never Used  Substance and Sexual Activity  . Alcohol use: No  . Drug use: No  . Sexual activity:  Never  Lifestyle  . Physical activity:    Days per week: Not on file    Minutes per session: Not on file  . Stress: Not on file  Relationships  . Social connections:    Talks on phone: Not on file    Gets together: Not on file    Attends religious service: Not on file    Active member of club or organization: Not on file    Attends meetings of clubs or organizations: Not on file    Relationship status: Not on file  Other Topics Concern  . Not on file  Social History Narrative  . Not on file     Family History: The patient's family history includes Alcohol abuse in her maternal uncle and son; Cancer in her son; Diabetes in her brother, daughter, daughter, father, mother, sister, son, son, and son; Emphysema in her brother; Heart attack in her daughter, father, and paternal grandfather; Heart disease in her father; Hyperlipidemia in her brother, father, and sister; Hypertension in her brother, father, and sister; Suicidality in her son. ROS:   Please see the history of present illness.     All other systems reviewed and are negative.  EKGs/Labs/Other Studies Reviewed:    The following studies were reviewed today:  Echocardiogram 09/27/2018 IMPRESSIONS  1. The left ventricle has mild-moderately reduced systolic  function of 40-45%. The cavity size was normal. There is no increased left ventricular wall thickness. Echo evidence of pseudonormalization in diastolic relaxation Elevated left ventricular  end-diastolic pressure.  2. There is akinesis of the entire inferior left ventricular segment.  3. The right ventricle has normal systolic function. The cavity was normal. There is no increase in right ventricular wall thickness. Right ventricular systolic pressure could not be assessed.  4. The mitral valve is normal in structure. There is mild thickening and moderate calcification.  5. The tricuspid valve is normal in structure.  6. The aortic valve is normal in structure. There is mild thickening and mild calcification of the aortic valve.  FINDINGS  Left Ventricle: The left ventricle has mild-moderately reduced systolic function of 40-45%. The cavity size was normal. There is no increased left ventricular wall thickness. Echo evidence of pseudonormalization in diastolic relaxation Elevated left  ventricular end-diastolic pressure There is akinesis of the entire inferior left ventricular segment. Definity contrast agent was given IV to delineate the left ventricular endocardial borders. Right Ventricle: The right ventricle has normal systolic function. The cavity was normal. There is no increase in right ventricular wall thickness. Right ventricular systolic pressure could not be assessed. Left Atrium: left atrial size was normal in size Right Atrium: right atrial size was normal in size Interatrial Septum: No atrial level shunt detected by color flow Doppler.   EKG:  EKG is not ordered today.    Recent Labs: 09/26/2018: B Natriuretic Peptide 230.4 09/27/2018: TSH 0.929 10/02/2018: ALT 17; Hemoglobin 9.1; Platelets 32 10/03/2018: Magnesium 2.1 10/16/2018: BUN 21; Creatinine, Ser 0.73; Potassium 4.4; Sodium 138   Recent Lipid Panel    Component Value Date/Time   CHOL 154 03/12/2018 1000   TRIG 145  03/12/2018 1000   HDL 50 03/12/2018 1000   CHOLHDL 3.1 03/12/2018 1000   CHOLHDL 2.5 05/02/2016 0758   VLDL 25 05/02/2016 0758   LDLCALC 75 03/12/2018 1000    Physical Exam:    VS:  BP 108/60   Pulse 64   Ht 5' (1.524 m)   Wt 180 lb 1.9 oz (81.7 kg)  SpO2 98%   BMI 35.18 kg/m     Wt Readings from Last 3 Encounters:  10/28/18 180 lb 1.9 oz (81.7 kg)  10/16/18 181 lb (82.1 kg)  10/16/18 180 lb 11.2 oz (82 kg)     Physical Exam  Constitutional: She is oriented to person, place, and time. She appears well-developed and well-nourished. No distress.  HENT:  Head: Normocephalic and atraumatic.  Neck: Normal range of motion. Neck supple. No JVD present.  Cardiovascular: Normal rate, regular rhythm, normal heart sounds and intact distal pulses. Exam reveals no gallop and no friction rub.  No murmur heard. With frequent early beats  Pulmonary/Chest: Effort normal and breath sounds normal. No respiratory distress. She has no wheezes. She has no rales.  Abdominal: Soft. Bowel sounds are normal.  Musculoskeletal: Normal range of motion.        General: No deformity or edema.  Neurological: She is alert and oriented to person, place, and time.  Skin: Skin is warm and dry.  Psychiatric: She has a normal mood and affect. Her behavior is normal. Judgment and thought content normal.  Vitals reviewed.    ASSESSMENT:    1. Chronic combined systolic (congestive) and diastolic (congestive) heart failure (HCC)   2. Cirrhosis of liver with ascites, unspecified hepatic cirrhosis type (HCC)   3. Paroxysmal atrial fibrillation (HCC)   4. Coronary artery disease involving native coronary artery of native heart without angina pectoris   5. Thrombocytopenia, unspecified (HCC)   6. Insulin dependent diabetes mellitus with complications (HCC)    PLAN:    In order of problems listed above:  Chronic systolic and diastolic heart failure -EF 40-45% by echo in 09/2018 -Patient with ascites that  was tapped in the hospital. Noted to be related to non-alcoholic Cirrhosis. Pt does not feel like her ascites is reaccumulating. She has no orthopnea, PND or peripheral edema.  -On Lasix 40 mg twice daily -Appears very stable. Continue current therapy. -Pts daughter concerned about pt being sleepy, possibly medication related -on metoprolol. I discussed with the patient and she is not too concerned about it. Will continue metoprolol and can revisit if this bothers her. I also advised to report this to GI at appt on Friday as could be high ammonia, although the patient looks very alert today.   Ascites -Patient with nonalcoholic cirrhosis. Sees gastroenterology at [facility], Dr. Dulce Sellar, appt on Friday. -On Aldactone 50 mg daily.  Followed by PCP and GI.  Paroxysmal atrial fibrillation -Noted during hospitalization last month.  Thought to be episodic. -In setting of thrombocytopenia and possible further need for repeat paracentesis, the patient is felt not to be a good candidate for anticoagulation. -Carvedilol was switched to metoprolol to help maintain sinus rhythm.  Amiodarone would not be a good choice with her cirrhosis. -By auscultation, in sinus rhythm with premature beats.   CAD with prior inferior wall MI and stents to RCA and LAD -Last cath 2010 with patent stents and otherwise nonobstructive disease. -Patient had recent chest pain felt to be related to A. Fib.  -No longer having any chest pressure.   Thrombocytopenia -Most recent level of platelets was 32,000 10/02/2018.  Followed by PCP, GI  Diabetes type 2 on insulin -A1c 9 in January. Pt attributes to her recent illnesses and cirrhosis. We discussed target of < 7 or 7.5. Discussed limiting concentrated sweets and bread. She had fasting labs done yesterday and has an upcoming PCP appt.   Medication Adjustments/Labs and Tests Ordered: Current medicines  are reviewed at length with the patient today.  Concerns regarding medicines are  outlined above. Labs and tests ordered and medication changes are outlined in the patient instructions below:  Patient Instructions  Medication Instructions:  Your physician recommends that you continue on your current medications as directed. Please refer to the Current Medication list given to you today.  If you need a refill on your cardiac medications before your next appointment, please call your pharmacy.   Lab work: None  If you have labs (blood work) drawn today and your tests are completely normal, you will receive your results only by: Marland Kitchen MyChart Message (if you have MyChart) OR . A paper copy in the mail If you have any lab test that is abnormal or we need to change your treatment, we will call you to review the results.  Testing/Procedures: None  Follow-Up: At [facility], you and your health needs are our priority.  As part of our continuing mission to provide you with exceptional heart care, we have created designated Provider Care Teams.  These Care Teams include your primary Cardiologist (physician) and Advanced Practice Providers (APPs -  Physician Assistants and Nurse Practitioners) who all work together to provide you with the care you need, when you need it. You will need a follow up appointment in:  3-4 months.  Please call our office 2 months in advance to schedule this appointment.  You may see Kristeen Miss, MD or one of the following Advanced Practice Providers on your designated Care Team: Tereso Newcomer, PA-C Vin [place]ier, [place] . Berton Bon, NP  Any Other Special Instructions Will Be Listed Below (If Applicable).  DASH Eating Plan DASH stands for "Dietary Approaches to Stop Hypertension." The DASH eating plan is a healthy eating plan that has been shown to reduce high blood pressure (hypertension). It may also reduce your risk for type 2 diabetes, heart disease, and stroke. The DASH eating plan may also help with weight loss. What are tips for following this  plan?  General guidelines  Avoid eating more than 2,300 mg (milligrams) of salt (sodium) a day. If you have hypertension, you may need to reduce your sodium intake to 1,500 mg a day.  Limit alcohol intake to no more than 1 drink a day for nonpregnant women and 2 drinks a day for men. One drink equals 12 oz of beer, 5 oz of wine, or 1 oz of hard liquor.  Work with your health care provider to maintain a healthy body weight or to lose weight. Ask what an ideal weight is for you.  Get at least 30 minutes of exercise that causes your heart to beat faster (aerobic exercise) most days of the week. Activities may include walking, swimming, or biking.  Work with your health care provider or diet and nutrition specialist (dietitian) to adjust your eating plan to your individual calorie needs. Reading food labels   Check food labels for the amount of sodium per serving. Choose foods with less than 5 percent of the Daily Value of sodium. Generally, foods with less than 300 mg of sodium per serving fit into this eating plan.  To find whole grains, look for the word "whole" as the first word in the ingredient list. Shopping  Buy products labeled as "low-sodium" or "no salt added."  Buy fresh foods. Avoid canned foods and premade or frozen meals. Cooking  Avoid adding salt when cooking. Use salt-free seasonings or herbs instead of table salt or sea salt. Check  with your health care provider or pharmacist before using salt substitutes.  Do not fry foods. Cook foods using healthy methods such as baking, boiling, grilling, and broiling instead.  Cook with heart-healthy oils, such as olive, canola, soybean, or sunflower oil. Meal planning  Eat a balanced diet that includes: ? 5 or more servings of fruits and vegetables each day. At each meal, try to fill half of your plate with fruits and vegetables. ? Up to 6-8 servings of whole grains each day. ? Less than 6 oz of lean meat, poultry, or fish  each day. A 3-oz serving of meat is about the same size as a deck of cards. One egg equals 1 oz. ? 2 servings of low-fat dairy each day. ? A serving of nuts, seeds, or beans 5 times each week. ? Heart-healthy fats. Healthy fats called Omega-3 fatty acids are found in foods such as flaxseeds and coldwater fish, like sardines, salmon, and mackerel.  Limit how much you eat of the following: ? Canned or prepackaged foods. ? Food that is high in trans fat, such as fried foods. ? Food that is high in saturated fat, such as fatty meat. ? Sweets, desserts, sugary drinks, and other foods with added sugar. ? Full-fat dairy products.  Do not salt foods before eating.  Try to eat at least 2 vegetarian meals each week.  Eat more home-cooked food and less restaurant, buffet, and fast food.  When eating at a restaurant, ask that your food be prepared with less salt or no salt, if possible. What foods are recommended? The items listed may not be a complete list. Talk with your dietitian about what dietary choices are best for you. Grains Whole-grain or whole-wheat bread. Whole-grain or whole-wheat pasta. Brown rice. Orpah Cobb. Bulgur. Whole-grain and low-sodium cereals. Pita bread. Low-fat, low-sodium crackers. Whole-wheat flour tortillas. Vegetables Fresh or frozen vegetables (raw, steamed, roasted, or grilled). Low-sodium or reduced-sodium tomato and vegetable juice. Low-sodium or reduced-sodium tomato sauce and tomato paste. Low-sodium or reduced-sodium canned vegetables. Fruits All fresh, dried, or frozen fruit. Canned fruit in natural juice (without added sugar). Meat and other protein foods Skinless chicken or [place]. Ground chicken or [place]. Pork with fat trimmed off. Fish and seafood. Egg whites. Dried beans, peas, or lentils. Unsalted nuts, nut butters, and seeds. Unsalted canned beans. Lean cuts of beef with fat trimmed off. Low-sodium, lean deli meat. Dairy Low-fat (1%) or fat-free  (skim) milk. Fat-free, low-fat, or reduced-fat cheeses. Nonfat, low-sodium ricotta or cottage cheese. Low-fat or nonfat yogurt. Low-fat, low-sodium cheese. Fats and oils Soft margarine without trans fats. Vegetable oil. Low-fat, reduced-fat, or light mayonnaise and salad dressings (reduced-sodium). Canola, safflower, olive, soybean, and sunflower oils. Avocado. Seasoning and other foods Herbs. Spices. Seasoning mixes without salt. Unsalted popcorn and pretzels. Fat-free sweets. What foods are not recommended? The items listed may not be a complete list. Talk with your dietitian about what dietary choices are best for you. Grains Baked goods made with fat, such as croissants, muffins, or some breads. Dry pasta or rice meal packs. Vegetables Creamed or fried vegetables. Vegetables in a cheese sauce. Regular canned vegetables (not low-sodium or reduced-sodium). Regular canned tomato sauce and paste (not low-sodium or reduced-sodium). Regular tomato and vegetable juice (not low-sodium or reduced-sodium). Rosita Fire. Olives. Fruits Canned fruit in a light or heavy syrup. Fried fruit. Fruit in cream or butter sauce. Meat and other protein foods Fatty cuts of meat. Ribs. Fried meat. Tomasa Blase. Sausage. Bologna and other processed lunch  meats. Salami. Fatback. Hotdogs. Bratwurst. Salted nuts and seeds. Canned beans with added salt. Canned or smoked fish. Whole eggs or egg yolks. Chicken or [place] with skin. Dairy Whole or 2% milk, cream, and half-and-half. Whole or full-fat cream cheese. Whole-fat or sweetened yogurt. Full-fat cheese. Nondairy creamers. Whipped toppings. Processed cheese and cheese spreads. Fats and oils Butter. Stick margarine. Lard. Shortening. Ghee. Bacon fat. Tropical oils, such as coconut, palm kernel, or palm oil. Seasoning and other foods Salted popcorn and pretzels. Onion salt, garlic salt, seasoned salt, table salt, and sea salt. Worcestershire sauce. Tartar sauce. Barbecue sauce.  Teriyaki sauce. Soy sauce, including reduced-sodium. Steak sauce. Canned and packaged gravies. Fish sauce. Oyster sauce. Cocktail sauce. Horseradish that you find on the shelf. Ketchup. Mustard. Meat flavorings and tenderizers. Bouillon cubes. Hot sauce and Tabasco sauce. Premade or packaged marinades. Premade or packaged taco seasonings. Relishes. Regular salad dressings. Where to find more information:  National Heart, Lung, and Blood Institute: PopSteam.is  American Heart Association: www.heart.org Summary  The DASH eating plan is a healthy eating plan that has been shown to reduce high blood pressure (hypertension). It may also reduce your risk for type 2 diabetes, heart disease, and stroke.  With the DASH eating plan, you should limit salt (sodium) intake to 2,300 mg a day. If you have hypertension, you may need to reduce your sodium intake to 1,500 mg a day.  When on the DASH eating plan, aim to eat more fresh fruits and vegetables, whole grains, lean proteins, low-fat dairy, and heart-healthy fats.  Work with your health care provider or diet and nutrition specialist (dietitian) to adjust your eating plan to your individual calorie needs. This information is not intended to replace advice given to you by your health care provider. Make sure you discuss any questions you have with your health care provider. Document Released: 07/26/2011 Document Revised: 07/30/2016 Document Reviewed: 07/30/2016 Elsevier Interactive Patient Education  [address]       Signed, Berton Bon, NP  10/28/2018 11:47 AM    [facility]

## 2018-10-29 ENCOUNTER — Encounter: Payer: Self-pay | Admitting: Family Medicine

## 2018-11-03 ENCOUNTER — Encounter: Payer: Self-pay | Admitting: Family Medicine

## 2018-11-03 ENCOUNTER — Other Ambulatory Visit: Payer: Self-pay

## 2018-11-03 ENCOUNTER — Other Ambulatory Visit: Payer: Self-pay | Admitting: Family Medicine

## 2018-11-03 MED ORDER — SPIRONOLACTONE 25 MG PO TABS: 50.0000 mg | ORAL_TABLET | Freq: Every day | ORAL | 3 refills | Status: DC

## 2018-11-03 MED ORDER — INSULIN ASPART 100 UNIT/ML FLEXPEN: 10.0000 [IU] | PEN_INJECTOR | Freq: Three times a day (TID) | SUBCUTANEOUS | 1 refills | Status: DC

## 2018-11-03 MED ORDER — FUROSEMIDE 40 MG PO TABS: 40.0000 mg | ORAL_TABLET | Freq: Two times a day (BID) | ORAL | 3 refills | Status: DC

## 2018-11-03 NOTE — Progress Notes (Signed)
Patient needed refills sent to [facility].

## 2018-11-05 ENCOUNTER — Other Ambulatory Visit: Payer: Self-pay | Admitting: Adult Health

## 2018-11-05 MED ORDER — MOMETASONE FURO-FORMOTEROL FUM 200-5 MCG/ACT IN AERO: 2.0000 | INHALATION_SPRAY | Freq: Two times a day (BID) | RESPIRATORY_TRACT | 1 refills | Status: DC

## 2018-11-05 NOTE — Progress Notes (Unsigned)
Dulera263mg/5mcg

## 2018-11-06 ENCOUNTER — Telehealth: Payer: Self-pay

## 2018-11-07 ENCOUNTER — Institutional Professional Consult (permissible substitution): Payer: PPO | Admitting: Internal Medicine

## 2018-11-07 ENCOUNTER — Other Ambulatory Visit: Payer: Self-pay | Admitting: Family Medicine

## 2018-11-07 NOTE — Telephone Encounter (Signed)
Error

## 2018-11-10 ENCOUNTER — Other Ambulatory Visit: Payer: Self-pay

## 2018-11-10 MED ORDER — NITROGLYCERIN 0.4 MG SL SUBL: SUBLINGUAL_TABLET | SUBLINGUAL | 3 refills | Status: DC

## 2018-11-11 ENCOUNTER — Other Ambulatory Visit: Payer: Self-pay

## 2018-11-11 ENCOUNTER — Encounter: Payer: Self-pay | Admitting: Family Medicine

## 2018-11-11 MED ORDER — INSULIN ASPART 100 UNIT/ML FLEXPEN: PEN_INJECTOR | SUBCUTANEOUS | 0 refills | Status: DC

## 2018-11-17 ENCOUNTER — Ambulatory Visit: Payer: PPO | Admitting: Cardiology

## 2018-11-18 DIAGNOSIS — J449 Chronic obstructive pulmonary disease, unspecified: Secondary | ICD-10-CM | POA: Diagnosis not present

## 2018-11-25 ENCOUNTER — Other Ambulatory Visit: Payer: Self-pay

## 2018-11-25 ENCOUNTER — Encounter: Payer: Self-pay | Admitting: Family Medicine

## 2018-11-25 ENCOUNTER — Ambulatory Visit (INDEPENDENT_AMBULATORY_CARE_PROVIDER_SITE_OTHER): Payer: PPO | Admitting: Family Medicine

## 2018-11-25 VITALS — Ht 60.0 in | Wt 178.0 lb

## 2018-11-25 DIAGNOSIS — J42 Unspecified chronic bronchitis: Secondary | ICD-10-CM

## 2018-11-25 DIAGNOSIS — F329 Major depressive disorder, single episode, unspecified: Secondary | ICD-10-CM | POA: Diagnosis not present

## 2018-11-25 DIAGNOSIS — I152 Hypertension secondary to endocrine disorders: Secondary | ICD-10-CM

## 2018-11-25 DIAGNOSIS — I5042 Chronic combined systolic (congestive) and diastolic (congestive) heart failure: Secondary | ICD-10-CM | POA: Diagnosis not present

## 2018-11-25 DIAGNOSIS — E118 Type 2 diabetes mellitus with unspecified complications: Secondary | ICD-10-CM

## 2018-11-25 DIAGNOSIS — IMO0001 Reserved for inherently not codable concepts without codable children: Secondary | ICD-10-CM

## 2018-11-25 DIAGNOSIS — E1159 Type 2 diabetes mellitus with other circulatory complications: Secondary | ICD-10-CM

## 2018-11-25 DIAGNOSIS — J3089 Other allergic rhinitis: Secondary | ICD-10-CM | POA: Diagnosis not present

## 2018-11-25 DIAGNOSIS — I10 Essential (primary) hypertension: Secondary | ICD-10-CM

## 2018-11-25 DIAGNOSIS — F32A Depression, unspecified: Secondary | ICD-10-CM

## 2018-11-25 DIAGNOSIS — Z794 Long term (current) use of insulin: Secondary | ICD-10-CM

## 2018-11-25 MED ORDER — FEXOFENADINE HCL 180 MG PO TABS: 180.0000 mg | ORAL_TABLET | Freq: Every day | ORAL | 3 refills | Status: DC

## 2018-11-25 MED ORDER — FLUTICASONE PROPIONATE 50 MCG/ACT NA SUSP: NASAL | 5 refills | Status: DC

## 2018-11-25 NOTE — Progress Notes (Signed)
Virtual Visit via Telephone Note for [employer], D.O- Primary Care Physician at [facility]   I connected with current patient today by telephone and verified that I am speaking with the correct person using two identifiers.   Because of federal recommendations of social distancing due to the current novel COVID-19 outbreak, an audio/video telehealth visit is felt to be most appropriate for this patient at this time.  My staff members also discussed with the patient that there may be a patient charge related to this service.   The patient expressed understanding, and agreed to proceed.    History of Present Illness:  "I am doing good excpt I have a sinus infection"-  No F/C, no one sided face pain.  Both sides congested, pressure, nose runny.   Worst sx is HA's/ pressure acrossed forehead and nose feels swollen.  Take Singulair daily and occ takes nose drops->  Ocean nasal spray. But nothing else.  Has been outside some.     DM HPI:  Went to see Dr Talmage Nap recently---> A1c was 7.0.   -  She has been working on diet and exercise for diabetes.  Walking more thru house.  Diet- better b/c Dr Talmage Nap put her on a specific sugar-free diet.  FBS- 172 this am--< usually run around 141, 82, 101, 149  Taking: novolog- tid- around 15-20units 30 units novolog with supper/ evening meal  and 45u Lantus q hs   Pt is currently maintained on the following medications for diabetes:   see med list today  Medication compliance - great   Denies polyuria/polydipsia.  Denies hypo/ hyperglycemia symptoms   Last diabetic eye exam was  --->pt was seen by DM retinopathy specialist this year in 2020- dr Jerolyn Center. Told her no W- staying the same  Lab Results  Component Value Date   HMDIABEYEEXA Retinopathy (A) 04/30/2016    Foot exam- UTD   Last A1C in the office was:  Lab Results  Component Value Date   HGBA1C 9.0 (H) 08/26/2018   HGBA1C 8.4 (A) 07/14/2018   HGBA1C 8.0 (A) 03/12/2018     Lab Results  Component Value Date   MICROALBUR 20 08/26/2018   LDLCALC 75 03/12/2018   CREATININE 0.73 10/16/2018    Wt Readings from Last 3 Encounters:  11/25/18 178 lb (80.7 kg)  10/28/18 180 lb 1.9 oz (81.7 kg)  10/16/18 181 lb (82.1 kg)    BP Readings from Last 3 Encounters:  10/28/18 108/60  10/16/18 126/74  10/16/18 (!) 104/50       Wt Readings from Last 3 Encounters:  11/25/18 178 lb (80.7 kg)  10/28/18 180 lb 1.9 oz (81.7 kg)  10/16/18 181 lb (82.1 kg)    BP Readings from Last 3 Encounters:  10/28/18 108/60  10/16/18 126/74  10/16/18 (!) 104/50    Pulse Readings from Last 3 Encounters:  10/28/18 64  10/16/18 87  10/16/18 (!) 109    BMI Readings from Last 3 Encounters:  11/25/18 34.76 kg/m  10/28/18 35.18 kg/m  10/16/18 35.35 kg/m      -Vitals obtained; Medications, allergies reconciled;  personal medical, social, Sx etc. etc. histories were updated by Leda Min the medical assistant today and are reflected in below chart   Patient Care Team    Relationship Specialty Notifications Start End  Thomasene Lot, DO PCP - General Family Medicine  03/12/16   Nahser, Deloris Ping, MD PCP - Cardiology Cardiology Admissions 10/28/18   Kalman Shan,  MD Attending Physician Pulmonary Disease Abnormal results only, Admissions 12/07/12   Nahser, Deloris Ping, MD Consulting Physician Cardiology  05/01/16   Lynann Bologna, MD Referring Physician Gastroenterology  05/01/16   Dorisann Frames, MD Consulting Physician Endocrinology  05/22/17   Weston Settle, MD Consulting Physician Oncology  10/16/18   Willis Modena, MD Consulting Physician Gastroenterology  10/16/18   Sherrie George, MD Consulting Physician Ophthalmology  11/25/18    Comment: DM retinop specialist     Patient Active Problem List   Diagnosis Date Noted  . Hypertension associated with diabetes (HCC) 05/22/2017    Priority: High  . Morbidly obese (HCC)- BMI >er 35 w DM 06/23/2016     Priority: High  . Insulin dependent diabetes mellitus with complications (HCC) 03/13/2016    Priority: High  . 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 Depression 03/13/2016    Priority: Medium  . Hypothyroidism 03/13/2016    Priority: Medium  . Thrombocytopenia, unspecified (HCC) 12/13/2012    Priority: Medium  . Coronary artery disease 04/11/2011    Priority: Medium  . COPD (chronic obstructive pulmonary disease) (HCC) 04/11/2011    Priority: Medium  . Obstructive sleep apnea 04/11/2011    Priority: Medium  . Chronic combined systolic (congestive) and diastolic (congestive) heart failure (HCC) 03/29/2008    Priority: Medium  . Vitamin D deficiency 05/11/2016    Priority: Low  . Atherosclerosis of carotid arteries 04/03/2006    Priority: Low  . Paroxysmal atrial fibrillation (HCC) 10/28/2018  . Cirrhosis of liver with ascites (HCC) 10/16/2018  . Pancytopenia (HCC) 09/26/2018  . COPD exacerbation (HCC) 09/26/2018  . Acute on chronic diastolic CHF (congestive heart failure) (HCC) 09/26/2018  . Acute on chronic respiratory failure with hypoxia (HCC) 09/26/2018  . PNA (pneumonia) 09/15/2018  . Microalbuminuria due to type 2 diabetes mellitus (HCC) 08/26/2018  . Hypocalcemia 03/12/2018  . Gastroesophageal reflux disease 12/31/2017  . chronic Low platelet count 06/05/2017  . chronic Leukopenia 05/11/2016  . Elevated serum alkaline phosphatase level- hepatocellular in origin 05/11/2016  . Constipation, chronic 03/13/2016  . At high risk for osteoporosis 03/13/2016  . Hypotension 12/13/2012  . Hypokalemia 12/13/2012  . h/o Clostridium difficile colitis 12/12/2012  . History of GI bleed 12/12/2012  . ILD (interstitial lung disease) (HCC) 11/25/2012     Current Meds  Medication Sig  . albuterol (PROAIR HFA) 108 (90 Base) MCG/ACT inhaler Inhale 2 puffs into the lungs every 6 (six) hours as needed for wheezing or  shortness of breath.  . Blood Glucose Monitoring Suppl (FREESTYLE LITE) DEVI Use to check blood sugars 2-3 times daily  . Carboxymeth-Glyc-Polysorb PF (REFRESH OPTIVE MEGA-3) 0.5-1-0.5 % SOLN Apply 1-2 drops to eye 2 (two) times daily.   . Cholecalciferol (VITAMIN D3) 50 MCG (2000 UT) TABS Take 1 tablet by mouth daily.  Marland Kitchen EASY TOUCH PEN NEEDLES 31G X 5 MM MISC USE TO INJECT INSULIN 5 TIMES A DAY  . escitalopram (LEXAPRO) 20 MG tablet TAKE 1 TABLET (20 MG TOTAL) BY MOUTH DAILY.  . furosemide (LASIX) 40 MG tablet Take 1 tablet (40 mg total) by mouth 2 (two) times daily.  . insulin aspart (NOVOLOG FLEXPEN) 100 UNIT/ML FlexPen Inject 10-40 units into ski 3 times daily with meals. Adjust as needed.  . Insulin Glargine (LANTUS SOLOSTAR) 100 UNIT/ML Solostar Pen Inject 35 Units into the skin 2 (two) times daily.  . Lancets (ONETOUCH DELICA PLUS LANCET33G) MISC USE  TO CHECK BLOOD SUGAR THREE TIMES A DAY  . levothyroxine (SYNTHROID, LEVOTHROID) 100 MCG tablet Take 1 tablet (100 mcg total) by mouth daily.  . metoprolol tartrate (LOPRESSOR) 25 MG tablet Take 0.5 tablets (12.5 mg total) by mouth 2 (two) times daily.  . mometasone-formoterol (DULERA) 200-5 MCG/ACT AERO Inhale 2 puffs into the lungs 2 (two) times daily.  . montelukast (SINGULAIR) 10 MG tablet TAKE 1 TABLET BY MOUTH EVERY EVENING.  . Multiple Vitamins-Minerals (VISION FORMULA/LUTEIN) TABS Take 1 tablet by mouth daily.  . nitroGLYCERIN (NITROSTAT) 0.4 MG SL tablet Place 1 tab under the tongue every 5 minutes as needed for chest pain.  . ONE TOUCH ULTRA TEST test strip TEST BLOOD SUGAR 2-3 TIMES A DAY  . pantoprazole (PROTONIX) 20 MG tablet Take 1 tablet (20 mg total) by mouth 2 (two) times daily.  [name]. Polyethyl Glycol-Propyl Glycol (SYSTANE) 0.4-0.3 % SOLN Apply 1 drop to eye 4 (four) times daily.   Marland Kitchen. Propylene Glycol (SYSTANE BALANCE) 0.6 % SOLN Apply 1 drop to eye 2 (two) times daily.  Marland Kitchen. spironolactone (ALDACTONE) 25 MG tablet Take 2 tablets (50  mg total) by mouth daily.  . White Petrolatum-Mineral Oil (GENTEAL TEARS NIGHT-TIME) OINT Apply 1 application to eye at bedtime.  Cliffton Asters. White Petrolatum-Mineral Oil (SYSTANE NIGHTTIME) OINT Place 1 application into both eyes at bedtime as needed (irritation).      Allergies:  Allergies  Allergen Reactions  . Ace Inhibitors Cough  . Benadryl [Diphenhydramine Hcl (Sleep)] Other (See Comments)    hypotension  . Penicillins Hives and Swelling    Swelling of arms Has patient had a PCN reaction causing immediate rash, facial/tongue/throat swelling, SOB or lightheadedness with hypotension: unknown Has patient had a PCN reaction causing severe rash involving mucus membranes or skin necrosis: no Has patient had a PCN reaction that required hospitalization: unknown Has patient had a PCN reaction occurring within the last 10 years: no, childhood allergy If all of the above answers are "NO", then may proceed with Cephalosporin use.   . Sulfonamide Derivatives Swelling     ROS:  See above HPI for pertinent positives and negatives   Objective:   Height 5' (1.524 m), weight 178 lb (80.7 kg). General: sounds in no acute distress.  Skin: Pt confirms warm and dry  extremities and pink fingertips Respiratory: speaking in full sentences, no conversational dyspnea Psych: A and O *3, appears insight good, mood- full      Impression and Recommendations:    1. Insulin dependent diabetes mellitus with complications (HCC)- insulin dosing managed by Dr. Michel HarrowBindubal Bailin.   - prudent diet and moving more as welll as keeping wt or losing highly encouraged - per pt last A1c was less than 8.0 w Balan- last 1 mo or so. I sent request to my front desk staff as well as my CMA's to please obtain patient's last A1c from Dr. Vella RaringBalin's office and please add to chart to updated as well as contacting patient's diabetic retinopathy specialist Dr. Ashley RoyaltyMatthews as per patient, she had a diabetic eye exam this year.  I asked  them to please update chart with these results.  2. Hypertension associated with diabetes (HCC) -Patient not currently monitoring.  Has historically been very well controlled. -Remains asymptomatic, dietary lifestyle modification as well as weight loss encouraged.  3. Morbidly obese (HCC)- BMI >er 35 w DM - losing thru prudent diet/ exercise highly encouraged  4. Chronic combined systolic (congestive) and diastolic (congestive) heart failure (HCC) Symptoms stable,  asymptomatic.  5. Chronic bronchitis, unspecified chronic bronchitis type (HCC) Symptoms stable currently.  No complaints  6. Environmental and seasonal allergies - Seasonal and environmental allergies discussed with patient.  Preventative strategies as first line for management discussed and I encouraged use of sterile saline rinses such as Lloyd Huger Med or AYR sinus rinses to be done twice daily and after any prolonged exposure to the environment or allergen.      - Discussed the use of over-the-counter medications for symptom control as well.  If continues to worsen despite preventative strategies, take over-the-counter Allegra daily during allergy seasons.  We can consider Flonase, Rhinocort or the like, if symptoms are not well controlled with just oral tablets, nasal rinses and preventative strategies.  - Encouraged to return to clinic or call the office today discussed further questions or concerns they may have.  7. Depression, unspecified depression type Symptoms currently stable.  Continue meds.    I discussed the assessment and treatment plan with the patient. The patient was provided an opportunity to ask questions and all were answered.  - The patient agreed with the plan and demonstrated an understanding of the instructions.   No barriers to understanding were identified.  Red flag symptoms and signs discussed in detail.  Patient expressed understanding regarding what to do in case of emergency\urgent symptoms   The  patient was advised to call back or seek an in-person evaluation if the symptoms worsen or if the condition fails to improve as anticipated.   Return for q 28mo for DM, but opposite of Dr Willeen Cass OV's- thus q 67mo with [place] and her.    Meds ordered this encounter  Medications  . fexofenadine (ALLEGRA) 180 MG tablet    Sig: Take 1 tablet (180 mg total) by mouth daily. During allergy season    Dispense:  90 tablet    Refill:  3  . fluticasone (FLONASE) 50 MCG/ACT nasal spray    Sig: 1 spray each nostril following sinus rinses twice daily during allergy season    Dispense:  16 g    Refill:  5    Medications Discontinued During This Encounter  Medication Reason  . Fluticasone-Salmeterol (ADVAIR) 250-50 MCG/DOSE AEPB Change in therapy     Gross side effects, risk and benefits, and alternatives of medications and treatment plan in general discussed with patient.  Patient is aware that all medications have potential side effects and we are unable to predict every side effect or drug-drug interaction that may occur.   Patient was strongly encouraged to call with any questions or concerns they may have concerns.     I provided 16+ minutes of non-face-to-face time during this encounter.   Thomasene Lot, DO

## 2018-11-27 ENCOUNTER — Encounter: Payer: Self-pay | Admitting: Family Medicine

## 2018-11-28 DIAGNOSIS — Z95811 Presence of heart assist device: Secondary | ICD-10-CM | POA: Diagnosis not present

## 2018-11-28 DIAGNOSIS — G4733 Obstructive sleep apnea (adult) (pediatric): Secondary | ICD-10-CM | POA: Diagnosis not present

## 2018-11-30 ENCOUNTER — Encounter: Payer: Self-pay | Admitting: Family Medicine

## 2018-12-02 ENCOUNTER — Other Ambulatory Visit: Payer: Self-pay

## 2018-12-02 DIAGNOSIS — J3089 Other allergic rhinitis: Secondary | ICD-10-CM

## 2018-12-02 MED ORDER — LORATADINE 10 MG PO TABS: 10.0000 mg | ORAL_TABLET | Freq: Every day | ORAL | 1 refills | Status: DC

## 2018-12-02 NOTE — Progress Notes (Signed)
Sent in RX for Loratadine 10 mg 1 po daily # 90 1 refill per Dr. Synthia Innocent MyChart message response.  Patient notified. MPulliam, CMA/RT(R)

## 2018-12-18 DIAGNOSIS — J449 Chronic obstructive pulmonary disease, unspecified: Secondary | ICD-10-CM | POA: Diagnosis not present

## 2018-12-28 DIAGNOSIS — G4733 Obstructive sleep apnea (adult) (pediatric): Secondary | ICD-10-CM | POA: Diagnosis not present

## 2018-12-30 ENCOUNTER — Encounter: Payer: Self-pay | Admitting: Family Medicine

## 2018-12-30 NOTE — Telephone Encounter (Signed)
Appointment made for the patient for tomorrow 12/31/2018. MPulliam, CMA/RT(R)

## 2018-12-31 ENCOUNTER — Other Ambulatory Visit: Payer: Self-pay

## 2018-12-31 ENCOUNTER — Encounter: Payer: Self-pay | Admitting: Family Medicine

## 2018-12-31 ENCOUNTER — Ambulatory Visit (INDEPENDENT_AMBULATORY_CARE_PROVIDER_SITE_OTHER): Payer: HMO | Admitting: Family Medicine

## 2018-12-31 VITALS — BP 143/78 | HR 69 | Ht 60.0 in | Wt 180.0 lb

## 2018-12-31 DIAGNOSIS — E1129 Type 2 diabetes mellitus with other diabetic kidney complication: Secondary | ICD-10-CM | POA: Diagnosis not present

## 2018-12-31 DIAGNOSIS — R748 Abnormal levels of other serum enzymes: Secondary | ICD-10-CM

## 2018-12-31 DIAGNOSIS — D61818 Other pancytopenia: Secondary | ICD-10-CM

## 2018-12-31 DIAGNOSIS — E782 Mixed hyperlipidemia: Secondary | ICD-10-CM

## 2018-12-31 DIAGNOSIS — E118 Type 2 diabetes mellitus with unspecified complications: Secondary | ICD-10-CM | POA: Diagnosis not present

## 2018-12-31 DIAGNOSIS — I2510 Atherosclerotic heart disease of native coronary artery without angina pectoris: Secondary | ICD-10-CM | POA: Diagnosis not present

## 2018-12-31 DIAGNOSIS — E559 Vitamin D deficiency, unspecified: Secondary | ICD-10-CM

## 2018-12-31 DIAGNOSIS — I5033 Acute on chronic diastolic (congestive) heart failure: Secondary | ICD-10-CM | POA: Diagnosis not present

## 2018-12-31 DIAGNOSIS — I252 Old myocardial infarction: Secondary | ICD-10-CM | POA: Diagnosis not present

## 2018-12-31 DIAGNOSIS — I7090 Unspecified atherosclerosis: Secondary | ICD-10-CM

## 2018-12-31 DIAGNOSIS — R188 Other ascites: Secondary | ICD-10-CM

## 2018-12-31 DIAGNOSIS — Z794 Long term (current) use of insulin: Secondary | ICD-10-CM

## 2018-12-31 DIAGNOSIS — K5909 Other constipation: Secondary | ICD-10-CM

## 2018-12-31 DIAGNOSIS — E1069 Type 1 diabetes mellitus with other specified complication: Secondary | ICD-10-CM

## 2018-12-31 DIAGNOSIS — E1159 Type 2 diabetes mellitus with other circulatory complications: Secondary | ICD-10-CM

## 2018-12-31 DIAGNOSIS — R809 Proteinuria, unspecified: Secondary | ICD-10-CM

## 2018-12-31 DIAGNOSIS — F32A Depression, unspecified: Secondary | ICD-10-CM

## 2018-12-31 DIAGNOSIS — K7460 Unspecified cirrhosis of liver: Secondary | ICD-10-CM

## 2018-12-31 DIAGNOSIS — IMO0001 Reserved for inherently not codable concepts without codable children: Secondary | ICD-10-CM

## 2018-12-31 DIAGNOSIS — I10 Essential (primary) hypertension: Secondary | ICD-10-CM

## 2018-12-31 DIAGNOSIS — I708 Atherosclerosis of other arteries: Secondary | ICD-10-CM | POA: Diagnosis not present

## 2018-12-31 DIAGNOSIS — F329 Major depressive disorder, single episode, unspecified: Secondary | ICD-10-CM

## 2018-12-31 NOTE — Progress Notes (Signed)
Telehealth office visit note for Mellody Dance, D.O- at Primary Care at [facility]   I connected with current patient today and verified that I am speaking with the correct person using two identifiers.    Location of the patient: Home  Location of the provider: Office Only the patient (+/- their family members at pt's discretion) and myself were participating in the encounter    - This visit type was conducted due to national recommendations for restrictions regarding the COVID-19 Pandemic (e.g. social distancing) in an effort to limit this patient's exposure and mitigate transmission in our community.  This format is felt to be most appropriate for this patient at this time.   - The patient did not have access to video technology or had technical difficulties with video requiring transitioning to audio format only. - No physical exam could be performed with this format, beyond that communicated to [place] by the patient/ family members as noted.   - Additionally my office staff/ schedulers discussed with the patient that there may be a monetary charge related to this service, depending on their medical insurance.   The patient expressed understanding, and agreed to proceed.       History of Present Illness:  Just recently saw patient on early April.  She did not have any of these concerns.  This primarily comes from her daughters email to [place] of these issues below.  Pt continues having problems with weakness and if gets out of bed quickly- feels little dizzy.    She has acute on chronic CHF as well as cirrhosis of the liver-nonalcoholic in which she had tremendous ascites and had to have paracentesis back in January.  She has had a rough time the past several months.  He does have a long history of orthostatic hypotension dating back to December 2011-  this has been an ongoing problem for pt that she has been being txed by Cards for.  She has had appointments with them on 10/16/2018 and  10/28/2018 in which medications of her beta-blockers and diuretics have been changed around because of her symptoms.  I reviewed both of those notes.  Currently patient states her blood pressure has been running okay at home-"around where it is today."   Otherwise she denies chest pain or paroxysmal nocturnal dyspnea.   NO orthopnea.  Denies heart palpitations or irregular heartbeats, with her history of paroxysmal A. fib.  No inc in pillows used at night---> still only uses one pillow.  She is been monitoring her weight and she has actually stated there is been a little weight loss if anything.  patient states if anything she has A little less than usual swelling of her legs.   Patient was told to follow-up by the cardiology NP or PA if her symptoms of weakness and lightheadedness did not improve and patient has not had a follow-up OV with them.  Past couple wks- upset stomach and D/loose stools, no vomiting, not eating as much as she usually would.  Patient has severe cirrhosis of the liver-nonalcoholic in which she had severe ascites with paracentesis this past January.  This is caused her significant pancytopenia and thrombocytopenia.   She also has a history of chronic diarrhea mixed with constipation, reflux, history of C. difficile, GI bleed and IBS in addition to the cirrhosis.  She has a long history of GI complaints, in which she is followed with [facility] for some time now.  She did have a scheduled appointment recently with her gastroenterologist Dr. Paulita Fujita which apparently was canceled due to covid-19.  Per patient she has not been able to reschedule an appointment with them for her symptoms.  Per patient, they are no worse than her usual.  She has been using some Imodium right ear which has been helping some.  Also daughter has concerns that mother might be depressed.  Patient was living with the daughter since her hospitalization and released from the hospital in January.  However, because the  daughter works in healthcare and is possibly around [place] patients, Ms. that I had to move back into her home on her own.  Now the daughter is worried this is depression causing many of her symptoms. -Patient denies depression, she denies sleep disorder.  She denies any suicidal or homicidal ideations.  She tells me she has no loss of pleasure or no loss of interest in things.  PHQ is actually improved from prior patient states it is not causing any significant problems in her life  DM:  Mgt per [name]- Endo.  FBS: 113, 141, 178, 126, 174, 152, 147.  She is someone that has been challenging for Endocrinology to control as she has been a brittle diabetic.  Last A1c- 08/26/2018- 9.0.  Around 230 am every am- she wakes up in a sweating and needs to eat.  At times runs 50-90-feels horrible with this.  Luckily she is waking up when she feels this way.  Pt was living with her daughter- but she is in Healthcare and told Mom she needs to be at home.      Impression and Recommendations:    1. Insulin dependent diabetes mellitus with complications (HCC)-managed by Endo   2. Hypertension associated with diabetes ([place])   3. Acute on chronic diastolic CHF (congestive heart failure) ([place])   4. Coronary artery disease involving native coronary artery of native heart without angina pectoris   5. Myocardial infarct, old   67. Atherosclerosis of carotid arteries   7. Mixed diabetic hyperlipidemia associated with type 1 diabetes mellitus ([place])   8. Morbidly obese ([place])- BMI >er 35 w DM   9. Microalbuminuria due to type 2 diabetes mellitus ([place])   10. Cirrhosis of liver with ascites, unspecified hepatic cirrhosis type ([place])   11. Pancytopenia ([place])   12. Elevated serum alkaline phosphatase level- hepatocellular in origin   13. Depression, unspecified depression type   14. Vitamin D deficiency   15. Constipation, chronic/  IBS syndrome-  also now with liver cirrhosis making her bowels irregular     Due to the  low sugars, I was not able to get a hold of Dr. Soyla Murphy currently and hence I recommend Pt to dec PM insulin- lantus dose by a half - she will only take 15units and she will make a f/up with [name].  Red flag symptoms and when to dial 911 discussed with patient.  Explained higher blood sugars are better than low ones in most instances in someone her age.  FE tab - take TID--> as this may help with her diarrhea or looser stools she is having currently.  -Also patient has not had some blood work since being discharged from the hospital so told her to call and make an appointment for that tomorrow morning.  She will come in here and get blood work and we will distribute to her specialists as necessary.  -Patient will continue current dose of Lexapro.  She denies  any increase in depression or symptoms of depression.  Also, pt will call her cardiologist, GI, Endo.  We have she will call as well to try to facilitate her getting in sooner to the specialist who treat her for these multiple conditions.  - As part of my medical decision making, I reviewed the following data within the [place] History obtained from pt /family, CMA notes reviewed and incorporated if applicable, Labs reviewed, Radiograph/ tests reviewed if applicable and OV notes from prior OV's with me, as well as other specialists she/he has seen since seeing me last, were all reviewed and used in my medical decision making process today.   - Additionally, discussion had with patient regarding txmnt plan, and their biases/concerns about that plan were used in my medical decision making today.   - The patient agreed with the plan and demonstrated an understanding of the instructions.   No barriers to understanding were identified.   - Red flag symptoms and signs discussed in detail.  Patient expressed understanding regarding what to do in case of emergency\ urgent symptoms.  The patient was advised to call back or seek an  in-person evaluation if the symptoms worsen or if the condition fails to improve as anticipated.   Return if symptoms worsen or fail to improve, for She will call for F BW near future, 1 month follow-up-will follow closely until sees her specialists.    Orders Placed This Encounter  Procedures   Vitamin B12   Folate   Iron and TIBC   Ferritin   Transferrin   Reticulocytes   Comprehensive metabolic panel   CBC with Differential/Platelet   Hemoglobin A1c   Magnesium   Phosphorus   T4, free   TSH   Microalbumin / creatinine urine ratio   VITAMIN D 25 Hydroxy (Vit-D Deficiency, Fractures)   Lipid panel     I provided 40+ minutes of non-face-to-face time during this encounter,with over 50% of the time in direct counseling on patients medical conditions/ medical concerns.  Additional time was spent with charting and coordination of care after the actual visit commenced.   Note:  This note was prepared with assistance of Dragon voice recognition software. Occasional wrong-word or sound-a-like substitutions may have occurred due to the inherent limitations of voice recognition software.  Mellody Dance, DO    Depression screen [facility] 2/9 12/31/2018 11/25/2018 10/16/2018 09/25/2018 08/26/2018  Decreased Interest 0 0 3 3 0  Down, Depressed, Hopeless 0 0 0 0 0  PHQ - 2 Score 0 0 3 3 0  Altered sleeping 0 0 2 2 2   Tired, decreased energy 3 0 2 3 1   Change in appetite 0 0 1 3 0  Feeling bad or failure about yourself  0 0 0 0 0  Trouble concentrating 1 0 3 2 0  Moving slowly or fidgety/restless 0 0 0 0 0  Suicidal thoughts 0 0 0 0 0  PHQ-9 Score 4 0 11 13 3   Difficult doing work/chores Somewhat difficult Not difficult at all Somewhat difficult Extremely dIfficult Not difficult at all  Some recent data might be hidden     Patient Care Team    Relationship Specialty Notifications Start End  Mellody Dance, DO PCP - General Family Medicine  03/12/16   Nahser, Wonda Cheng, MD  PCP - Cardiology Cardiology Admissions 10/28/18   Brand Males, MD Attending Physician Pulmonary Disease Abnormal results only, Admissions 12/07/12    Comment: severe COPD  Jackquline Denmark, MD  Referring Physician Gastroenterology  05/01/16   Jacelyn Pi, MD Consulting Physician Endocrinology  05/22/17   Marice Potter, MD Consulting Physician Oncology  10/16/18   Arta Silence, MD Consulting Physician Gastroenterology  10/16/18   Hayden Pedro, MD Consulting Physician Ophthalmology  11/25/18    Comment: DM retinop specialist  Isaiah Serge, NP Nurse Practitioner Cardiology  12/31/18    Comment: works with dr. Acie Fredrickson     -Vitals obtained; medications/ allergies reconciled;  personal medical, social, Sx etc.histories were updated by CMA, reviewed by me and are reflected in chart   Patient Active Problem List   Diagnosis Date Noted   Paroxysmal atrial fibrillation ([place]) 10/28/2018    Priority: High   Cirrhosis of liver with ascites ([place]) 10/16/2018    Priority: High   Pancytopenia ([place]) 09/26/2018    Priority: High   Acute on chronic diastolic CHF (congestive heart failure) ([place]) 09/26/2018    Priority: High   Microalbuminuria due to type 2 diabetes mellitus ([place]) 08/26/2018    Priority: High   chronic Low platelet count 06/05/2017    Priority: High   Hypertension associated with diabetes ([place]) 05/22/2017    Priority: High   Morbidly obese ([place])- BMI >er 35 w DM 06/23/2016    Priority: High   chronic Leukopenia 05/11/2016    Priority: High   Insulin dependent diabetes mellitus with complications ([place]) 12/87/8676    Priority: High   Mixed diabetic hyperlipidemia associated with type 1 diabetes mellitus ([place]) 03/13/2016    Priority: High   ILD (interstitial lung disease) ([place]) 11/25/2012    Priority: High   Elevated serum alkaline phosphatase level- hepatocellular in origin 05/11/2016    Priority: Medium   Depression 03/13/2016    Priority: Medium    Hypothyroidism 03/13/2016    Priority: Medium   Thrombocytopenia, unspecified ([place]) 12/13/2012    Priority: Medium   Coronary artery disease 04/11/2011    Priority: Medium   COPD (chronic obstructive pulmonary disease) ([place]) 04/11/2011    Priority: Medium   Obstructive sleep apnea 04/11/2011    Priority: Medium   Chronic combined systolic (congestive) and diastolic (congestive) heart failure ([place]) 03/29/2008    Priority: Medium   Gastroesophageal reflux disease 12/31/2017    Priority: Low   Vitamin D deficiency 05/11/2016    Priority: Low   Constipation, chronic/  IBS syndrome 03/13/2016    Priority: Low   h/o Clostridium difficile colitis 12/12/2012    Priority: Low   History of GI bleed 12/12/2012    Priority: Low   Atherosclerosis of carotid arteries 04/03/2006    Priority: Low   Myocardial infarct, old 01/01/2019   COPD exacerbation ([place]) 09/26/2018   Acute on chronic respiratory failure with hypoxia (HCC) 09/26/2018   PNA (pneumonia) 09/15/2018   Hypocalcemia 03/12/2018   At high risk for osteoporosis 03/13/2016   Hypotension 12/13/2012   Hypokalemia 12/13/2012   Leg edema 01/07/2012     Current Meds  Medication Sig   albuterol (PROAIR HFA) 108 (90 Base) MCG/ACT inhaler Inhale 2 puffs into the lungs every 6 (six) hours as needed for wheezing or shortness of breath.   Blood Glucose Monitoring Suppl (FREESTYLE LITE) DEVI Use to check blood sugars 2-3 times daily   Carboxymeth-Glyc-Polysorb PF (REFRESH OPTIVE MEGA-3) 0.5-1-0.5 % SOLN Apply 1-2 drops to eye 2 (two) times daily.    Cholecalciferol (VITAMIN D3) 50 MCG (2000 UT) TABS Take 1 tablet by mouth daily.   EASY TOUCH PEN NEEDLES 31G X 5 MM  MISC USE TO INJECT INSULIN 5 TIMES A DAY   escitalopram (LEXAPRO) 20 MG tablet TAKE 1 TABLET (20 MG TOTAL) BY MOUTH DAILY.   fexofenadine (ALLEGRA) 180 MG tablet Take 1 tablet (180 mg total) by mouth daily. During allergy season   fluticasone (FLONASE)  50 MCG/ACT nasal spray 1 spray each nostril following sinus rinses twice daily during allergy season   furosemide (LASIX) 40 MG tablet Take 1 tablet (40 mg total) by mouth 2 (two) times daily.   insulin aspart (NOVOLOG FLEXPEN) 100 UNIT/ML FlexPen Inject 10-40 units into ski 3 times daily with meals. Adjust as needed.   Insulin Glargine (LANTUS SOLOSTAR) 100 UNIT/ML Solostar Pen Inject 35 Units into the skin 2 (two) times daily.   Lancets (ONETOUCH DELICA PLUS [id number]) MISC USE TO CHECK BLOOD SUGAR THREE TIMES A DAY   levothyroxine (SYNTHROID, LEVOTHROID) 100 MCG tablet Take 1 tablet (100 mcg total) by mouth daily.   loratadine (CLARITIN) 10 MG tablet Take 1 tablet (10 mg total) by mouth daily.   metoprolol tartrate (LOPRESSOR) 25 MG tablet Take 0.5 tablets (12.5 mg total) by mouth 2 (two) times daily.   mometasone-formoterol (DULERA) 200-5 MCG/ACT AERO Inhale 2 puffs into the lungs 2 (two) times daily.   montelukast (SINGULAIR) 10 MG tablet TAKE 1 TABLET BY MOUTH EVERY EVENING.   Multiple Vitamins-Minerals (VISION FORMULA/LUTEIN) TABS Take 1 tablet by mouth daily.   nitroGLYCERIN (NITROSTAT) 0.4 MG SL tablet Place 1 tab under the tongue every 5 minutes as needed for chest pain.   ONE TOUCH ULTRA TEST test strip TEST BLOOD SUGAR 2-3 TIMES A DAY   pantoprazole (PROTONIX) 20 MG tablet Take 1 tablet (20 mg total) by mouth 2 (two) times daily.   Polyethyl Glycol-Propyl Glycol (SYSTANE) 0.4-0.3 % SOLN Apply 1 drop to eye 4 (four) times daily.    Propylene Glycol (SYSTANE BALANCE) 0.6 % SOLN Apply 1 drop to eye 2 (two) times daily.   spironolactone (ALDACTONE) 25 MG tablet Take 2 tablets (50 mg total) by mouth daily.   White Petrolatum-Mineral Oil (GENTEAL TEARS NIGHT-TIME) OINT Apply 1 application to eye at bedtime.   White Petrolatum-Mineral Oil (SYSTANE NIGHTTIME) OINT Place 1 application into both eyes at bedtime as needed (irritation).      Allergies:  Allergies  Allergen  Reactions   Ace Inhibitors Cough   Benadryl [Diphenhydramine Hcl (Sleep)] Other (See Comments)    hypotension   Penicillins Hives and Swelling    Swelling of arms Has patient had a PCN reaction causing immediate rash, facial/tongue/throat swelling, SOB or lightheadedness with hypotension: unknown Has patient had a PCN reaction causing severe rash involving mucus membranes or skin necrosis: no Has patient had a PCN reaction that required hospitalization: unknown Has patient had a PCN reaction occurring within the last 10 years: no, childhood allergy If all of the above answers are "NO", then may proceed with Cephalosporin use.    Sulfonamide Derivatives Swelling     ROS:  See above HPI for pertinent positives and negatives   Objective:   Blood pressure (!) 143/78, pulse 69, height 5' (1.524 m), weight 180 lb (81.6 kg).  (if some vitals are omitted, this means that patient was UNABLE to obtain them even though they were asked to get them prior to OV today.  They were asked to call us at their earliest convenience with these once obtained. )  General: A & O * 3; sounds in no acute distress; in usual state of health.  Skin:  Pt confirms warm and dry extremities and pink fingertips HEENT: Pt confirms lips non-cyanotic Chest: Patient confirms normal chest excursion and movement Respiratory: speaking in full sentences, no conversational dyspnea; patient confirms no use of accessory muscles Psych: insight appears good, mood- appears full

## 2019-01-01 ENCOUNTER — Telehealth: Payer: Self-pay

## 2019-01-01 ENCOUNTER — Telehealth: Payer: Self-pay | Admitting: Cardiovascular Disease

## 2019-01-01 DIAGNOSIS — I252 Old myocardial infarction: Secondary | ICD-10-CM | POA: Insufficient documentation

## 2019-01-01 NOTE — Telephone Encounter (Signed)
Spoke with patient who confirmed all demographics. Patient has a smart phone and uses My Chart. Will have vitals ready for visit. °

## 2019-01-01 NOTE — Telephone Encounter (Signed)
Per Dr. Sharee Holster - called and set up appointments as soon as possible for Cardiology, GI, and endo.   Cards appt with [place] Monday 04/07/2019 and GI appointment with Dr. Randa Evens tomorrow 01/02/2019 at 9 am.  Patient's daughter has been notified of appointments.  Called Dr. Willeen Cass office - endo and they are out until June for an appointment.  Advised that the patient needs to be seen sooner as she is having glucose levels dropping low at night causing her to wake at night with sweats and is having fatigue and light headedness.  They stated they would send Dr Talmage Nap a message to see if they can get the patient in sooner.  MPulliam, CMA/RT(R)

## 2019-01-02 ENCOUNTER — Institutional Professional Consult (permissible substitution): Payer: PPO | Admitting: Internal Medicine

## 2019-01-02 DIAGNOSIS — K7460 Unspecified cirrhosis of liver: Secondary | ICD-10-CM | POA: Diagnosis not present

## 2019-01-02 DIAGNOSIS — R197 Diarrhea, unspecified: Secondary | ICD-10-CM | POA: Diagnosis not present

## 2019-01-02 DIAGNOSIS — J45909 Unspecified asthma, uncomplicated: Secondary | ICD-10-CM | POA: Diagnosis not present

## 2019-01-02 DIAGNOSIS — J449 Chronic obstructive pulmonary disease, unspecified: Secondary | ICD-10-CM | POA: Diagnosis not present

## 2019-01-02 DIAGNOSIS — I8500 Esophageal varices without bleeding: Secondary | ICD-10-CM | POA: Diagnosis not present

## 2019-01-02 DIAGNOSIS — D696 Thrombocytopenia, unspecified: Secondary | ICD-10-CM | POA: Diagnosis not present

## 2019-01-02 DIAGNOSIS — Z8619 Personal history of other infectious and parasitic diseases: Secondary | ICD-10-CM | POA: Diagnosis not present

## 2019-01-02 DIAGNOSIS — Z9981 Dependence on supplemental oxygen: Secondary | ICD-10-CM | POA: Diagnosis not present

## 2019-01-05 ENCOUNTER — Telehealth (INDEPENDENT_AMBULATORY_CARE_PROVIDER_SITE_OTHER): Payer: HMO | Admitting: Cardiovascular Disease

## 2019-01-05 ENCOUNTER — Encounter: Payer: Self-pay | Admitting: Cardiovascular Disease

## 2019-01-05 ENCOUNTER — Other Ambulatory Visit: Payer: Self-pay

## 2019-01-05 VITALS — BP 145/73 | HR 69 | Ht 60.0 in | Wt 180.0 lb

## 2019-01-05 DIAGNOSIS — I5042 Chronic combined systolic (congestive) and diastolic (congestive) heart failure: Secondary | ICD-10-CM

## 2019-01-05 DIAGNOSIS — I2510 Atherosclerotic heart disease of native coronary artery without angina pectoris: Secondary | ICD-10-CM

## 2019-01-05 DIAGNOSIS — I5033 Acute on chronic diastolic (congestive) heart failure: Secondary | ICD-10-CM | POA: Diagnosis not present

## 2019-01-05 DIAGNOSIS — I480 Paroxysmal atrial fibrillation: Secondary | ICD-10-CM

## 2019-01-05 DIAGNOSIS — Z7189 Other specified counseling: Secondary | ICD-10-CM

## 2019-01-05 DIAGNOSIS — E108 Type 1 diabetes mellitus with unspecified complications: Secondary | ICD-10-CM | POA: Diagnosis not present

## 2019-01-05 DIAGNOSIS — IMO0001 Reserved for inherently not codable concepts without codable children: Secondary | ICD-10-CM

## 2019-01-05 MED ORDER — ISOSORBIDE MONONITRATE ER 30 MG PO TB24: 30.0000 mg | ORAL_TABLET | Freq: Every day | ORAL | 11 refills | Status: DC

## 2019-01-05 NOTE — Progress Notes (Signed)
Virtual Visit via Telephone Note   This visit type was conducted due to national recommendations for restrictions regarding the COVID-19 Pandemic (e.g. social distancing) in an effort to limit this patient's exposure and mitigate transmission in our community.  Due to her co-morbid illnesses, this patient is at least at moderate risk for complications without adequate follow up.  This format is felt to be most appropriate for this patient at this time.  The patient did not have access to video technology/had technical difficulties with video requiring transitioning to audio format only (telephone).  All issues noted in this document were discussed and addressed.  No physical exam could be performed with this format.  Please refer to the patient's chart for her  consent to telehealth for [facility].   Date:  01/05/2019   ID:  [name], DOB 1933/11/23, MRN [id number]  Patient Location: Home Provider Location: Home PCP:  Mellody Dance, DO  Cardiologist:  Mertie Moores, MD  Electrophysiologist:  None   Evaluation Performed:  Follow-Up Visit  Chief Complaint:  Follow up CHF ,   Problem list 1.  Coronary artery disease-status post inferior wall myocardial infarction-status post stenting of the right coronary artery and later stenting of the LAD 2.  COPD 3.  Interstitial lung disease 4.  Chronic combined systolic and diastolic congestive heart failure 5.  Morbid obesity 6.  Insulin-dependent diabetes mellitus 7.  Paroxysmal atrial fibrillation   [name] is a 85 y.o. female with a past medical history significant for CAD status post inferior wall MI and stent to RCA, later stent to LAD, COPD on home oxygen, interstitial lung disease, chronic diastolic heart failure, IDDM, morbid obesity, GERD, hypothyroidism, OSA not on CPAP, nonalcoholic cirrhosis, history of SVT.  The patient was hospitalized 09/17/2018 for COPD exacerbation and pneumonia and also 10/03/2018 for acute on  chronic respiratory failure.  The patient has had massive ascites requiring paracentesis and was given albumin.  She is on Lasix and Aldactone.  She also has anemia and thrombocytopenia.  She had A. fib during hospitalization.  She has been deemed to be a poor anticoagulation candidate for this reason     Jan 05, 2019   [name] is a 85 y.o. female with CAD, chronic diastolic CHF. Has occasional CP, under her left breast. Pain is not similar to her previous MI pain.  Exercising regulalry  - without any cP  Tries to follow a good diet    The patient does not have symptoms concerning for COVID-19 infection (fever, chills, cough, or new shortness of breath).    Past Medical History:  Diagnosis Date  . Cirrhosis, non-alcoholic ([place])   . Coronary artery disease    status post inferior wall myocardial infarction  . Diabetes mellitus   . Dyslipidemia   . Hypotension 08/02/2010   recent episodes of orthostatic hypotension  . Hypothyroidism   . Leg edema   . Myocardial infarct, old   . Neuropathy of hand    left hand numbness/neuropathy  . Obesity   . Sleep apnea    Past Surgical History:  Procedure Laterality Date  . ABDOMINAL HYSTERECTOMY    . CARDIAC CATHETERIZATION     The ejection fraction is around 50%  . CHOLECYSTECTOMY    . CORONARY STENT PLACEMENT    . ESOPHAGOGASTRODUODENOSCOPY N/A 12/12/2012   Procedure: ESOPHAGOGASTRODUODENOSCOPY (EGD);  Surgeon: [name], MD;  Location: [name];  Service: Endoscopy;  Laterality: N/A;  . FLEXIBLE SIGMOIDOSCOPY N/A  12/12/2012   Procedure: FLEXIBLE SIGMOIDOSCOPY;  Surgeon: [name], MD;  Location: [name];  Service: Endoscopy;  Laterality: N/A;  . IR PARACENTESIS  09/29/2018  . IR PARACENTESIS  10/01/2018  . TUBAL LIGATION    . WRIST SURGERY       Current Meds  Medication Sig  . albuterol (PROAIR HFA) 108 (90 Base) MCG/ACT inhaler Inhale 2 puffs into the lungs every 6 (six) hours as needed for wheezing  or shortness of breath.  . Blood Glucose Monitoring Suppl (FREESTYLE LITE) DEVI Use to check blood sugars 2-3 times daily  . Carboxymeth-Glyc-Polysorb PF (REFRESH OPTIVE MEGA-3) 0.5-1-0.5 % SOLN Apply 1-2 drops to eye 2 (two) times daily.   . Cholecalciferol (VITAMIN D3) 50 MCG (2000 UT) TABS Take 1 tablet by mouth daily.  Marland Kitchen EASY TOUCH PEN NEEDLES 31G X 5 MM MISC USE TO INJECT INSULIN 5 TIMES A DAY  . escitalopram (LEXAPRO) 20 MG tablet TAKE 1 TABLET (20 MG TOTAL) BY MOUTH DAILY.  . fexofenadine (ALLEGRA) 180 MG tablet Take 1 tablet (180 mg total) by mouth daily. During allergy season  . fluticasone (FLONASE) 50 MCG/ACT nasal spray 1 spray each nostril following sinus rinses twice daily during allergy season  . furosemide (LASIX) 40 MG tablet Take 1 tablet (40 mg total) by mouth 2 (two) times daily.  . insulin aspart (NOVOLOG FLEXPEN) 100 UNIT/ML FlexPen Inject 10-40 units into ski 3 times daily with meals. Adjust as needed.  . Insulin Glargine (LANTUS SOLOSTAR) 100 UNIT/ML Solostar Pen Inject 20 Units into the skin 2 (two) times daily.  . Lancets (ONETOUCH DELICA PLUS [id number]) MISC USE TO CHECK BLOOD SUGAR THREE TIMES A DAY  . levothyroxine (SYNTHROID, LEVOTHROID) 100 MCG tablet Take 1 tablet (100 mcg total) by mouth daily.  Marland Kitchen loratadine (CLARITIN) 10 MG tablet Take 1 tablet (10 mg total) by mouth daily.  . metoprolol tartrate (LOPRESSOR) 25 MG tablet Take 0.5 tablets (12.5 mg total) by mouth 2 (two) times daily.  . mometasone-formoterol (DULERA) 200-5 MCG/ACT AERO Inhale 2 puffs into the lungs 2 (two) times daily.  . montelukast (SINGULAIR) 10 MG tablet TAKE 1 TABLET BY MOUTH EVERY EVENING.  . Multiple Vitamins-Minerals (VISION FORMULA/LUTEIN) TABS Take 1 tablet by mouth daily.  . nitroGLYCERIN (NITROSTAT) 0.4 MG SL tablet Place 1 tab under the tongue every 5 minutes as needed for chest pain.  . ONE TOUCH ULTRA TEST test strip TEST BLOOD SUGAR 2-3 TIMES A DAY  . pantoprazole (PROTONIX) 20 MG  tablet Take 1 tablet (20 mg total) by mouth 2 (two) times daily.  Vladimir Faster Glycol-Propyl Glycol (SYSTANE) 0.4-0.3 % SOLN Apply 1 drop to eye 4 (four) times daily.   Marland Kitchen Propylene Glycol (SYSTANE BALANCE) 0.6 % SOLN Apply 1 drop to eye 2 (two) times daily.  . RESTASIS 0.05 % ophthalmic emulsion 1 drop both eyes twice daily  . spironolactone (ALDACTONE) 25 MG tablet Take 2 tablets (50 mg total) by mouth daily.  . vancomycin (VANCOCIN) 125 MG capsule Take 125 mg by mouth 4 (four) times daily.  . White Petrolatum-Mineral Oil (GENTEAL TEARS NIGHT-TIME) OINT Apply 1 application to eye at bedtime.  Dema Severin Petrolatum-Mineral Oil (SYSTANE NIGHTTIME) OINT Place 1 application into both eyes at bedtime as needed (irritation).      Allergies:   Ace inhibitors; Benadryl [diphenhydramine hcl (sleep)]; Penicillins; and Sulfonamide derivatives   Social History   Tobacco Use  . Smoking status: Former Smoker    Last attempt to quit: 08/20/1973  Years since quitting: 45.4  . Smokeless tobacco: Never Used  Substance Use Topics  . Alcohol use: No  . Drug use: No     Family Hx: The patient's family history includes Alcohol abuse in her maternal uncle and son; Cancer in her son; Diabetes in her brother, daughter, daughter, father, mother, sister, son, son, and son; Emphysema in her brother; Heart attack in her daughter, father, and paternal grandfather; Heart disease in her father; Hyperlipidemia in her brother, father, and sister; Hypertension in her brother, father, and sister; Suicidality in her son.  ROS:   Please see the history of present illness.     All other systems reviewed and are negative.   Prior CV studies:   The following studies were reviewed today:    Labs/Other Tests and Data Reviewed:    EKG:  No ECG reviewed.  Recent Labs: 09/26/2018: B Natriuretic Peptide 230.4 09/27/2018: TSH 0.929 10/03/2018: Magnesium 2.1 10/10/2018: ALT 46; Hemoglobin 11.1; Platelets 60 10/16/2018: BUN 21;  Creatinine, Ser 0.73; Potassium 4.4; Sodium 138   Recent Lipid Panel Lab Results  Component Value Date/Time   CHOL 154 03/12/2018 10:00 AM   TRIG 145 03/12/2018 10:00 AM   HDL 50 03/12/2018 10:00 AM   CHOLHDL 3.1 03/12/2018 10:00 AM   CHOLHDL 2.5 05/02/2016 07:58 AM   LDLCALC 75 03/12/2018 10:00 AM    Wt Readings from Last 3 Encounters:  01/05/19 180 lb (81.6 kg)  12/31/18 180 lb (81.6 kg)  11/25/18 178 lb (80.7 kg)     Objective:    Vital Signs:  BP (!) 145/73   Pulse 69   Ht 5' (1.524 m)   Wt 180 lb (81.6 kg)   BMI 35.15 kg/m    General:   Appears healthy,   NAD HEENT:   No obvious JVD or lymphadenopathy Resp:   Normal work of breathing,   resp rate is normal  CV :   BP and HR are normal ,  No edema Abd:   No abdomina swelling , Ext:   No clubbing, cyanosis, or edema  Neuro:   Alert and oriented x 3.   No obvious motor deficits Skin : no obvious rashes   ASSESSMENT & PLAN:    1. CAD ; she is having some episodes of chest pain but these do not feel like her previous episodes of angina.  They are not associated with exertion.  I suspect that these are just atypical chest pain.  We will call in isosorbide 30 mg a day and see if this helps.  If these pains worsen, she has been instructed to call.  2.  Chronic combined systlic / diastolic congestive heart failure: Encouraged diet, exercise, weight loss.  3.  Diabetes mellitus further plans per her primary medical doctor.  4.  Paroxysmal atrial fibrillation: Patient is thought to be a relatively poor candidate for anticoagulation.  Continue to follow.  COVID-19 Education: The signs and symptoms of COVID-19 were discussed with the patient and how to seek care for testing (follow up with PCP or arrange E-visit).  The importance of social distancing was discussed today.  Time:   Today, I have spent  17 minutes with the patient with telehealth technology discussing the above problems.     Medication Adjustments/Labs and  Tests Ordered: Current medicines are reviewed at length with the patient today.  Concerns regarding medicines are outlined above.   Tests Ordered: No orders of the defined types were placed in this encounter.  Medication Changes: No orders of the defined types were placed in this encounter.   Disposition:  Follow up in 6 month(s)  Signed, Mertie Moores, MD  01/05/2019 2:47 PM    [name]

## 2019-01-05 NOTE — Patient Instructions (Signed)
Medication Instructions:  Your physician has recommended you make the following change in your medication:  START Isosorbide mononitrate (Imdur) 30 mg once daily  If you need a refill on your cardiac medications before your next appointment, please call your pharmacy.    Lab work: None Ordered    Testing/Procedures: None Ordered    Follow-Up: At [facility], you and your health needs are our priority.  As part of our continuing mission to provide you with exceptional heart care, we have created designated Provider Care Teams.  These Care Teams include your primary Cardiologist (physician) and Advanced Practice Providers (APPs -  Physician Assistants and Nurse Practitioners) who all work together to provide you with the care you need, when you need it. You will need a follow up appointment in:  6 months.  Please call our office 2 months in advance to schedule this appointment.  You may see Kristeen Miss, MD or one of the following Advanced Practice Providers on your designated Care Team: Tereso Newcomer, PA-C Vin Drasco, [place] . Berton Bon, NP

## 2019-01-09 ENCOUNTER — Telehealth: Payer: Self-pay | Admitting: Cardiovascular Disease

## 2019-01-09 NOTE — Telephone Encounter (Signed)
New Message     [name] is calling to find out what medications the pt needs to be taking    Please call

## 2019-01-09 NOTE — Telephone Encounter (Signed)
Patient's daughter (DPR) is calling to check on medication changes from virtual visit on 01/05/19. Informed patient's daughter that the only change was imdur 30 daily was added and no medications were discontinued. Patient's daughter verbalized understanding.

## 2019-01-18 DIAGNOSIS — J449 Chronic obstructive pulmonary disease, unspecified: Secondary | ICD-10-CM | POA: Diagnosis not present

## 2019-01-20 ENCOUNTER — Ambulatory Visit: Payer: Self-pay | Admitting: *Deleted

## 2019-01-28 DIAGNOSIS — G4733 Obstructive sleep apnea (adult) (pediatric): Secondary | ICD-10-CM | POA: Diagnosis not present

## 2019-02-03 ENCOUNTER — Other Ambulatory Visit: Payer: Self-pay | Admitting: Family Medicine

## 2019-02-04 ENCOUNTER — Other Ambulatory Visit: Payer: Self-pay | Admitting: Family Medicine

## 2019-02-13 DIAGNOSIS — R161 Splenomegaly, not elsewhere classified: Secondary | ICD-10-CM | POA: Diagnosis not present

## 2019-02-13 DIAGNOSIS — D696 Thrombocytopenia, unspecified: Secondary | ICD-10-CM | POA: Diagnosis not present

## 2019-02-13 DIAGNOSIS — K7460 Unspecified cirrhosis of liver: Secondary | ICD-10-CM | POA: Diagnosis not present

## 2019-02-13 LAB — HEPATIC FUNCTION PANEL
ALT: 23 (ref 7–35)
AST: 38 — AB (ref 13–35)
Alkaline Phosphatase: 253 — AB (ref 25–125)
Bilirubin, Total: 1.2

## 2019-02-13 LAB — CBC AND DIFFERENTIAL
HCT: 42 (ref 36–46)
Hemoglobin: 14 (ref 12.0–16.0)
Platelets: 75 — AB (ref 150–399)
WBC: 4.3

## 2019-02-13 LAB — IRON,TIBC AND FERRITIN PANEL
%SAT: 36.5
Ferritin: 121
Iron: 87
TIBC: 238

## 2019-02-13 LAB — BASIC METABOLIC PANEL
BUN: 3 — AB (ref 4–21)
Creatinine: 0.7 (ref 0.5–1.1)
Glucose: 309
Potassium: 3.5 (ref 3.4–5.3)
Sodium: 131 — AB (ref 137–147)

## 2019-02-17 DIAGNOSIS — J449 Chronic obstructive pulmonary disease, unspecified: Secondary | ICD-10-CM | POA: Diagnosis not present

## 2019-02-27 DIAGNOSIS — G4733 Obstructive sleep apnea (adult) (pediatric): Secondary | ICD-10-CM | POA: Diagnosis not present

## 2019-03-03 DIAGNOSIS — J449 Chronic obstructive pulmonary disease, unspecified: Secondary | ICD-10-CM | POA: Diagnosis not present

## 2019-03-03 DIAGNOSIS — I8500 Esophageal varices without bleeding: Secondary | ICD-10-CM | POA: Diagnosis not present

## 2019-03-03 DIAGNOSIS — Z8619 Personal history of other infectious and parasitic diseases: Secondary | ICD-10-CM | POA: Diagnosis not present

## 2019-03-03 DIAGNOSIS — R197 Diarrhea, unspecified: Secondary | ICD-10-CM | POA: Diagnosis not present

## 2019-03-03 DIAGNOSIS — Z9981 Dependence on supplemental oxygen: Secondary | ICD-10-CM | POA: Diagnosis not present

## 2019-03-03 DIAGNOSIS — K7460 Unspecified cirrhosis of liver: Secondary | ICD-10-CM | POA: Diagnosis not present

## 2019-03-04 DIAGNOSIS — H401132 Primary open-angle glaucoma, bilateral, moderate stage: Secondary | ICD-10-CM | POA: Diagnosis not present

## 2019-03-04 DIAGNOSIS — Z961 Presence of intraocular lens: Secondary | ICD-10-CM | POA: Diagnosis not present

## 2019-03-04 DIAGNOSIS — E113552 Type 2 diabetes mellitus with stable proliferative diabetic retinopathy, left eye: Secondary | ICD-10-CM | POA: Diagnosis not present

## 2019-03-05 ENCOUNTER — Other Ambulatory Visit: Payer: Self-pay | Admitting: Family Medicine

## 2019-03-09 ENCOUNTER — Encounter: Payer: Self-pay | Admitting: *Deleted

## 2019-03-09 ENCOUNTER — Other Ambulatory Visit: Payer: Self-pay | Admitting: *Deleted

## 2019-03-09 DIAGNOSIS — I480 Paroxysmal atrial fibrillation: Secondary | ICD-10-CM

## 2019-03-09 DIAGNOSIS — I2510 Atherosclerotic heart disease of native coronary artery without angina pectoris: Secondary | ICD-10-CM

## 2019-03-09 DIAGNOSIS — J42 Unspecified chronic bronchitis: Secondary | ICD-10-CM

## 2019-03-09 DIAGNOSIS — IMO0001 Reserved for inherently not codable concepts without codable children: Secondary | ICD-10-CM

## 2019-03-09 NOTE — Patient Outreach (Signed)
[place] [facility]) Care Management Chronic Special Needs Program  03/09/2019  Name: Neira Bentsen DOB: March 02, 1934  MRN: [id number]  Ms. Teofila Bowery is enrolled in a chronic special needs plan for Diabetes. Chronic Care Management Coordinator telephoned client to review health risk assessment and to develop individualized care plan.  Introduced the chronic care management program, importance of client participation, and taking their care plan to all provider appointments and inpatient facilities.  Reviewed the transition of care process and possible referral to community care management.  Subjective: Mrs Inglett says she feels much better than she did in the spring. Says she has not needed her home oxygen or home nebulizer in recent months to treat her COPD and asthma.  She says she checks her blood sugar in the morning (fasting) and before dinner. She denies hypoglycemia in the last month and says her most recent Hgb A1C at Dr. Almetta Lovely office was 7.0% on 10/28/18 and [name] told her she can return in 6 months instead of 3 months. She says she is unable to exercise other than walking to the mailbox.  Mrs. Townshend states she takes her medications as prescribed, but she is on many and is now having difficulty affording her medications as she has reached the coverage gap. She also says she does have some difficulty seeing her insulin pen dosages when dialing the prescribed amount. She says she adjusts her mealtime dosage based on a sliding scale provided by [name] that's she keeps on her refrigerator.  Mrs. Gambino says she also weighs herself daily and is aware of weight gain that requires provider notification. She says she has a history of ascites and lower extremity edema. She says she has lost some weight because she got new dentures about 2 weeks ago and she is trying to adjust to them so she is not eating as much. Mrs. Hoey says she lives alone in a one story home with 3 -4 entry steps.  She says there is a wheelchair ramp that was built for her husband when he was alive. She says her son and daughter help with driving errands and grocery shopping and regularly check on her. She says she would be interested in investigating a Life Alert system since she lives alone. She says she has an advanced directive that she completed while in the hospital years ago but would like to complete a new one.   Goals Addressed            This Visit's Progress     Patient Stated   . "write a new advance directive" (pt-stated)        Other   .  Acknowledge receipt of Programme researcher, broadcasting/film/video      . Advanced Care Planning complete by 2021      . Client understands the importance of follow-up with providers by attending scheduled visits      . Client will report ability to obtain Medications within next 4-5 months      . Client will report no worsening of symptoms of Atrial Fibrillation within the next 4-5 months      . Client will report no worsening of symptoms related to heart disease within the next 4-5 months      . Client will verbalize knowledge of chronic lung disease as evidenced by no ED visits or Inpatient stays related to chronic lung disease       . Client will verbalize knowledge of self management of Hypertension as  evidences by BP reading of 140/90 or less; or as defined by provider      . HEMOGLOBIN A1C < 7.0      . Maintain timely refills of diabetic medication as prescribed within the year .      Marland Kitchen. Obtain annual  Lipid Profile, LDL-C      . Obtain Annual Eye (retinal)  Exam       . Obtain Annual Foot Exam      . Obtain annual screen for micro albuminuria (urine) , nephropathy (kidney problems)      . Obtain Hemoglobin A1C at least 2 times per year      . Visit Primary Care Provider or Endocrinologist at least 2 times per year         Client is meeting diabetes self-management goal of hemoglobin A1C with last reading of 7.0% on 10/28/18 without reports of hypoglycemia .  Reviewed COVID-19 cause, symptoms, precautions (social distancing, stay at home order, hand washing, wear face covering when unable to maintain or ensure 6 foot social distancing), and symptoms requiring provider notification.   Plan:   Send successful outreach letter with a copy of their individualized care plan,    Send individual care plan to provider and   Send educational material on HTN, atrial fibrillation, advance directives, COPD,   and heart disease   Chronic care management coordination will outreach in:  4-5 months  Will refer client to:  Social Work for life alert systems and assist with advance  directives and Pharmacy for assistance with costs of medications and magnifier for  insulin pens   Cranford MonJanet Kolbie Clarkston RN, CCM, CDE Chronic Care Producer, television/film/videoManagement Coordinator [facility] Care Management [phone]

## 2019-03-10 ENCOUNTER — Other Ambulatory Visit: Payer: Self-pay | Admitting: Pharmacist

## 2019-03-10 NOTE — Patient Outreach (Signed)
[facility]) Care Management  [place]   03/10/2019  [name] [facility] 1933-11-29 [id number]  Reason for referral: Medication Assistance  Referral source: [employer] Care Manager with [facility] Current insurance: [employer]  PMHx includes but not limited to:  CAD s/p stents RCA + LAD, T2DM, PAF, CHF, COPD on home oxygen, former smoker, ILD, obesity, hypothyroidism, nonalcoholic cirrhosis, hx SVT.  Note patient has anemia and thrombocytopenia therefore poor anticoagulation candidate for PAF per cardiology notes.  Hospitalized Jan 2020 for AECOPD / PNA and Feb 2020 for acute on chronic respiratory failure, found to have massive ascites requiring paracentesis + albumin.   Outreach:  Successful telephone call with patient.  HIPAA identifiers verified.   Subjective:  Patient reports she uses a mail order pharmacy [facility] and [facility].  She reports her son, Quillian Quince, helps her with managing medications and putting them into a pillbox and daughter, [name], is trying to look into possible patient assistance programs.  Patient does not recall [employer] phone number and it is not in [facility].  Andrea's contact information is listed.  Patient unable to confirm medications and does not know income information.  Requests that I speak with her children.    Call placed to [employer] unsuccessfully.  Left HIPAA compliant voicemail requesting a return call.   Plan: I will f/u with family within 3-4 days if I have not heard back yet  Ralene Bathe, PharmD, [place] [phone]

## 2019-03-11 ENCOUNTER — Other Ambulatory Visit: Payer: Self-pay | Admitting: Pharmacist

## 2019-03-11 NOTE — Patient Outreach (Signed)
[facility]) Care Management  [place]   03/11/2019  Annalee Meyerhoff [facility] 05/09/1934 [id number]  Reason for referral: Medication Assistance  Referral source: [employer] Care Manager with [facility] Current insurance: [employer] C-SNP  PMHx includes but not limited to:  CAD s/p stents RCA + LAD, T2DM, PAF, CHF, COPD on home oxygen, former smoker, ILD, obesity, hypothyroidism, nonalcoholic cirrhosis, hx SVT.  Note patient has anemia and thrombocytopenia therefore poor anticoagulation candidate for PAF per cardiology notes.  Hospitalized Jan 2020 for AECOPD / PNA and Feb 2020 for acute on chronic respiratory failure, found to have massive ascites requiring paracentesis + albumin.   Outreach:  Incoming call from patient's daughter, [name].  HIPAA identifiers verified.   Subjective:  [name] states she is a CMA at a respiratory clinic and both she and her brother assist patient with her medications.  They alternate weeks filling a pillbox for her.  She reports patient "does what she wants" with her insulins and uses a sliding scale for her long-acting insulin despite provider's advice against this.  Patient often then has "low" CBGs.  She states the biggest cost of prescriptions currently is with insulins.    Objective: The ASCVD Risk score Mikey Bussing DC Jr., et al., 2013) failed to calculate for the following reasons:   The 2013 ASCVD risk score is only valid for ages 45 to 47   The patient has a prior MI or stroke diagnosis  Lab Results  Component Value Date   CREATININE 0.73 10/16/2018   CREATININE 0.9 10/10/2018   CREATININE 0.90 10/03/2018    Lab Results  Component Value Date   HGBA1C 9.0 (H) 08/26/2018    Lipid Panel     Component Value Date/Time   CHOL 154 03/12/2018 1000   TRIG 145 03/12/2018 1000   HDL 50 03/12/2018 1000   CHOLHDL 3.1 03/12/2018 1000   CHOLHDL 2.5 05/02/2016 0758   VLDL 25 05/02/2016 0758   LDLCALC 75 03/12/2018  1000    BP Readings from Last 3 Encounters:  01/05/19 (!) 145/73  12/31/18 (!) 143/78  10/28/18 108/60    Allergies  Allergen Reactions  . Ace Inhibitors Cough  . Benadryl [Diphenhydramine Hcl (Sleep)] Other (See Comments)    hypotension  . Fexofenadine Hcl Other (See Comments)  . Penicillins Hives and Swelling    Swelling of arms Has patient had a PCN reaction causing immediate rash, facial/tongue/throat swelling, SOB or lightheadedness with hypotension: unknown Has patient had a PCN reaction causing severe rash involving mucus membranes or skin necrosis: no Has patient had a PCN reaction that required hospitalization: unknown Has patient had a PCN reaction occurring within the last 10 years: no, childhood allergy If all of the above answers are "NO", then may proceed with Cephalosporin use.   . Sulfonamide Derivatives Swelling    Medications Reviewed Today    Reviewed by Barrington Ellison, RN (Registered Nurse) on 03/09/19 at [place] List Status: <None>  Medication Order Taking? Sig Documenting Provider Last Dose Status Informant  albuterol (PROAIR HFA) 108 (90 Base) MCG/ACT inhaler [id number] Yes Inhale 2 puffs into the lungs every 6 (six) hours as needed for wheezing or shortness of breath. Mellody Dance, DO Taking Active Family Member  Blood Glucose Monitoring Suppl (FREESTYLE LITE) DEVI [id number] Yes Use to check blood sugars 2-3 times daily Mellody Dance, DO Taking Active Family Member  Carboxymeth-Glyc-Polysorb PF (REFRESH OPTIVE MEGA-3) 0.5-1-0.5 % SOLN [id number] Yes Apply 1-2 drops to  eye 2 (two) times daily.  [provider] Taking Active Family Member  Cholecalciferol (VITAMIN D3) 50 MCG (2000 UT) TABS [id number] Yes Take 1 tablet by mouth daily. [provider] Taking Active Family Member  EASY TOUCH PEN NEEDLES 31G X 5 MM [place] [id number] Yes USE TO INJECT INSULIN 5 TIMES A DAY Opalski, Deborah, DO Taking Active Family Member  escitalopram (LEXAPRO)  20 MG tablet [id number] Yes TAKE 1 TABLET (20 MG TOTAL) BY MOUTH DAILY. Mellody Dance, DO Taking Active Family Member  fexofenadine (ALLEGRA) 180 MG tablet [id number] Yes Take 1 tablet (180 mg total) by mouth daily. During allergy season Opalski, Deborah, DO Taking Active   fluticasone [facility]) 50 MCG/ACT nasal spray [id number] Yes 1 spray each nostril following sinus rinses twice daily during allergy season Mellody Dance, DO Taking Active   furosemide (LASIX) 40 MG tablet [id number] Yes Take 1 tablet (40 mg total) by mouth 2 (two) times daily. Daune Perch, NP Taking Active   insulin aspart (NOVOLOG FLEXPEN) 100 UNIT/ML FlexPen [id number] Yes Inject 10-40 units into ski 3 times daily with meals. Adjust as needed. Mellody Dance, DO Taking Active            Med Note Broadus John, Duwayne Heck Mar 09, 2019  4:45 PM) Uses sliding scale insulin guidelines provided by [name]  Insulin Glargine (LANTUS SOLOSTAR) 100 UNIT/ML Solostar Pen [id number] Yes Inject 20 Units into the skin 2 (two) times daily. [provider] Taking Active   isosorbide mononitrate (IMDUR) 30 MG 24 hr tablet [id number] Yes Take 1 tablet (30 mg total) by mouth daily. Nahser, Wonda Cheng, MD Taking Active   Lancets (ONETOUCH DELICA PLUS OMBTDH74B) [place] [id number] Yes USE TO CHECK BLOOD SUGAR THREE TIMES A DAY Opalski, Deborah, DO Taking Active   levothyroxine (SYNTHROID, LEVOTHROID) 100 MCG tablet [id number] Yes Take 1 tablet (100 mcg total) by mouth daily. Mellody Dance, DO Taking Active Family Member  loratadine (CLARITIN) 10 MG tablet [id number] Yes Take 1 tablet (10 mg total) by mouth daily. Mellody Dance, DO Taking Active   metoprolol tartrate (LOPRESSOR) 25 MG tablet [id number] Yes Take 0.5 tablets (12.5 mg total) by mouth 2 (two) times daily. Isaiah Serge, NP Taking Active   mometasone-formoterol [facility]) 200-5 MCG/ACT Hollie Salk [id number] Yes Inhale 2 puffs into the lungs 2 (two) times daily. Mina Marble D, NP Taking  Active   montelukast (SINGULAIR) 10 MG tablet [id number] Yes TAKE 1 TABLET BY MOUTH EVERY EVENING. Mellody Dance, DO Taking Active   Multiple Vitamins-Minerals (VISION FORMULA/LUTEIN) TABS [id number] Yes Take 1 tablet by mouth daily. [provider] Taking Active Family Member  nitroGLYCERIN (NITROSTAT) 0.4 MG SL tablet [id number] Yes Place 1 tab under the tongue every 5 minutes as needed for chest pain. Nahser, Wonda Cheng, MD Taking Active   ONE TOUCH ULTRA TEST test strip [id number]  TEST BLOOD SUGAR 2-3 TIMES A DAY Opalski, Deborah, DO  Active Family Member  pantoprazole (PROTONIX) 20 MG tablet [id number] Yes Take 1 tablet (20 mg total) by mouth 2 (two) times daily. Mellody Dance, DO Taking Active Family Member  Polyethyl Glycol-Propyl Glycol (SYSTANE) 0.4-0.3 % SOLN [id number] Yes Apply 1 drop to eye 4 (four) times daily.  [provider] Taking Active Family Member  Propylene Glycol (SYSTANE BALANCE) 0.6 % SOLN [id number]  Apply 1 drop to eye 2 (two) times daily. [provider]  Active Family Member  RESTASIS 0.05 % ophthalmic emulsion [id number] Yes 1 drop both eyes twice daily [provider] Taking Active   spironolactone (ALDACTONE) 25 MG tablet [id number] Yes Take 2 tablets (50 mg total) by mouth daily. Daune Perch, NP Taking Active   vancomycin (VANCOCIN) 125 MG capsule [id number] No Take 125 mg by mouth 4 (four) times daily. [provider] Not Taking Active   White Petrolatum-Mineral Oil (GENTEAL TEARS NIGHT-TIME) OINT [id number] Yes Apply 1 application to eye at bedtime. [provider] Taking Active Family Member  White Petrolatum-Mineral Oil (SYSTANE NIGHTTIME) OINT [id number] Yes Place 1 application into both eyes at bedtime as needed (irritation).  [provider] Taking Active Family Member          Assessment: Drugs sorted by system:  Neurologic/Psychologic: escitalopram  Cardiovascular: furosemide, spironolactone,  isosorbide mononitrate, metoprolol, NTG SL  Pulmonary/Allergy: albuterol inhaler, loratadine, montelukast  Gastrointestinal: pantoprazole  Endocrine:Novolog, Lantus, levothyroxine  Topical: refresh omega-3, systane, systane balance, systane nighttime oint, genteal tears oint, restasis  Vitamins/Minerals/Supplements: vitamin D, MVI  Medication Review Findings:  . Fexofenadine: Allergy to medication, removed from medication list.  Patient currently taking loratadine and tolerating medication.   . Removed Dulera, Vancomycin, Flonase NS from medication list as daughter reports patient no longer taking . SUPD: Not taking a statin medication.  Appears simvastatin '10mg'$  d/c'd in Jan 2020, patient previously on atorvastatin, rosuvastatin.   . Insulin:  o Lantus:  Taking insulin with sliding scale rather than as directed by provider o Novolog:  Not following sliding scale parameters, often gives herself more insulin than recommended     Medication Assistance Findings:  Medication assistance needs identified: Lantus, Novolog  Extra Help:  May be eligible for Partial or Full Extra Help Low Income Subsidy based on reported income and assets  -Reviewed program with daughter.  Daughter will look into this program and assist patient with applying.    Patient Assistance Programs: Novolog made by [place] requirement met: Yes o Out-of-pocket prescription expenditure met:   Not Applicable - Patient has met application requirements to apply for this program.  - Reviewed program requirements with patient.    Lantus made by [employer] o Income requirement met: Yes o Out-of-pocket prescription expenditure met:   Unknown - Reviewed program requirements with patient.   - Another option is for patient to substitute to Levemir and apply to [employer] with the Novolog is she has not met TROOP for the Lantus.   Plan: . Will f/u back up with daughter on Friday re: TROOP, Extra Help, confirm if  patient still on statin therapy  Ralene Bathe, PharmD, [place] [phone]

## 2019-03-12 ENCOUNTER — Other Ambulatory Visit: Payer: Self-pay

## 2019-03-12 NOTE — Patient Outreach (Signed)
[place] [facility]) Care Management  03/12/2019  [name] 1933-12-17 [id number]   Social work referral received from [facility], Kelli Churn, to assist patient with medical alert resources and Advance Directives.  Unsuccessful outreach today.  BSW left voicemail message.  Unsuccessful outreach letter mailed.  Will attempt to reach again within four business days.  Ronn Melena, BSW Social Worker [phone]

## 2019-03-13 ENCOUNTER — Ambulatory Visit: Payer: Self-pay | Admitting: Pharmacist

## 2019-03-13 ENCOUNTER — Ambulatory Visit: Payer: Self-pay | Admitting: *Deleted

## 2019-03-13 ENCOUNTER — Other Ambulatory Visit: Payer: Self-pay | Admitting: Pharmacist

## 2019-03-13 NOTE — Patient Outreach (Signed)
[place] [facility]) Care Management  [place] 03/13/2019  [place] 1934-07-02 [id number]  Reason for call: f/u on [employer] and/or [employer] patient assistance program eligibility for Novolog and Lantus, Extra Help eligibility, and statin therapy  Outreach:  Unsuccessful telephone call attempt #1 to patient's daughter. HIPAA compliant voicemail left requesting a return call  Plan:  -I will make another outreach attempt to patient within 3-4 business days.    Ralene Bathe, PharmD, [place] [phone]

## 2019-03-16 ENCOUNTER — Other Ambulatory Visit: Payer: Self-pay

## 2019-03-16 NOTE — Patient Outreach (Signed)
[place] [facility]) Care Management  03/16/2019  [name] [facility] 1934-06-12 [id number]   Successful outreach to patient regarding social work referral for medical alert systems as well as Regulatory affairs officer. BSW informed patient that medical alert system is likely not covered by insurance provider but she can contact customer service to confirm.  BSW informed patient that there are a wide variety of systems available and price can vary quite a bit. Patient stated that she would need to talk with her daughter about what she can realistically afford, however, she would like to explore inexpensive systems.  BSW agreed to send her a list of options.  BSW informed her that Advance Directive packet was mailed to her by [facility], Kelli Churn on 03/09/19.  BSW will follow up with patient within the next two weeks to ensure receipt of medical alert systems and Advance Directives.  BSW will further review Advance Directive documentation with her at that time.  Ronn Melena, BSW Social Worker [phone]

## 2019-03-17 ENCOUNTER — Other Ambulatory Visit: Payer: Self-pay | Admitting: Pharmacist

## 2019-03-17 ENCOUNTER — Ambulatory Visit: Payer: Self-pay | Admitting: Pharmacist

## 2019-03-17 NOTE — Patient Outreach (Addendum)
[place] [facility]) Care Management  [place] 03/17/2019  [place] Jan 26, 1934 [id number]  Reason for call: f/u on [employer] and/or [employer] patient assistance program eligibility for Novolog and Lantus, Extra Help eligibility, and statin therapy  Outreach:  Unsuccessful telephone call attempt #2 to patient's daughter.  HIPAA compliant voicemail left requesting a return call  Plan:  -I will make another outreach attempt to patient within 3-4 business days.    Ralene Bathe, PharmD, [place] [phone]    Addendum: incoming call from patient's daughter.  She reports she and her mom applied for Extra Help LIS application last week as patient may be eligible for partial Extra Help.  She has been unable to obtain TROOP for medications.  I will reach out to HTA for [place].  If patient does not qualify yet for Lantus, will reach out to endocrinologist to possibly substitute Lantus --> Levemir so we can apply for both Novolog and Levemir from same patient assistance program.   Ralene Bathe, PharmD, [place] [phone]

## 2019-03-18 ENCOUNTER — Ambulatory Visit: Payer: Self-pay | Admitting: Pharmacist

## 2019-03-19 ENCOUNTER — Other Ambulatory Visit: Payer: Self-pay | Admitting: Pharmacist

## 2019-03-19 NOTE — Patient Outreach (Signed)
[place] [facility]) Care Management  [place] 03/19/2019  [place] 1933/10/24 [id number]  Per HTA, patient's TROOP is $174.41 and she has LIS level 1.  -patient has not met the out-of-pocket expenditure required for Lantus PAP.   Care coordination call to Dr. Almetta Lovely office.  Message left requesting substitution from Lantus to Levemir to apply for patient assistance program along with Novolog from [employer].    Plan: Await call back from office  Ralene Bathe, PharmD, [place] [phone]

## 2019-03-20 ENCOUNTER — Ambulatory Visit: Payer: Self-pay | Admitting: Pharmacist

## 2019-03-20 ENCOUNTER — Other Ambulatory Visit: Payer: Self-pay | Admitting: Pharmacist

## 2019-03-20 DIAGNOSIS — J449 Chronic obstructive pulmonary disease, unspecified: Secondary | ICD-10-CM | POA: Diagnosis not present

## 2019-03-20 NOTE — Patient Outreach (Signed)
[facility] ([facility]) Care Management  [facility] 03/20/2019  [name] 02/27/1934 [id number]  Per HTA, co-pays for insulins = $8.95, verified with HTA representative.  Therefore patient is not eligible for any patient assistance programs due to having Full Extra Help.   Successful call with patient and her daughter, [name]. I reviewed Extra Help information and they voiced understanding.  Patient currently receives most medications through [facility] monthly.  Daughter will look into possibly getting 90 day supplies to save money with Extra Help co-pays.    Patient recently stopped statin due to myalgias.  I reviewed with patient and daughter possible change to rosuvastatin and reducing to 3x weekly to see if she can tolerate.  Daughter will discuss this with cardiology office at next appt.    [facility] pharmacy case is being closed due to the following reasons: -Goals of care have been met. -I have provided my contact information if patient or family needs to reach out to me in the future.  -Thank you for allowing [facility] pharmacy to be involved in this patient's care.     , PharmD, BCPS Clinical Pharmacist [facility] [phone]               

## 2019-03-30 ENCOUNTER — Other Ambulatory Visit: Payer: Self-pay

## 2019-03-30 ENCOUNTER — Ambulatory Visit: Payer: Self-pay

## 2019-03-30 DIAGNOSIS — G4733 Obstructive sleep apnea (adult) (pediatric): Secondary | ICD-10-CM | POA: Diagnosis not present

## 2019-03-30 MED ORDER — METOPROLOL TARTRATE 25 MG PO TABS: 12.5000 mg | ORAL_TABLET | Freq: Two times a day (BID) | ORAL | 3 refills | Status: DC

## 2019-03-30 MED ORDER — ISOSORBIDE MONONITRATE ER 30 MG PO TB24: 30.0000 mg | ORAL_TABLET | Freq: Every day | ORAL | 11 refills | Status: DC

## 2019-03-30 NOTE — Patient Outreach (Signed)
[place] [facility]) Care Management  03/30/2019  Jameia Makris [facility] [birth year] [id number]   Attempted to contact patient today to ensure receipt of medical alert systems and Advance Directives.  Left voicemail message.  Will attempt to reach again within four business days.  Ronn Melena, BSW Social Worker [phone]

## 2019-04-01 ENCOUNTER — Other Ambulatory Visit: Payer: Self-pay

## 2019-04-01 NOTE — Patient Outreach (Signed)
[place] [facility]) Care Management  04/01/2019  Bitha Fauteux [facility] 11-18-1933 [id number]   Successful follow up call to patient today.  Unfortunately, she reports not receiving Advance Directive packet or medical alert resources that were mailed.  Sending again today and will follow up within the next two weeks to ensure receipt.    Ronn Melena, BSW Social Worker [phone]

## 2019-04-02 ENCOUNTER — Other Ambulatory Visit: Payer: Self-pay | Admitting: Family Medicine

## 2019-04-02 DIAGNOSIS — E039 Hypothyroidism, unspecified: Secondary | ICD-10-CM

## 2019-04-15 ENCOUNTER — Other Ambulatory Visit: Payer: Self-pay

## 2019-04-15 ENCOUNTER — Ambulatory Visit: Payer: Self-pay

## 2019-04-15 DIAGNOSIS — Z961 Presence of intraocular lens: Secondary | ICD-10-CM | POA: Diagnosis not present

## 2019-04-15 DIAGNOSIS — H401132 Primary open-angle glaucoma, bilateral, moderate stage: Secondary | ICD-10-CM | POA: Diagnosis not present

## 2019-04-15 NOTE — Patient Outreach (Signed)
[place] [facility]) Care Management  04/15/2019  Cameran Ahmed [facility] 12-Oct-1933 [id number]  Attempted to contact patient to ensure receipt of Advance Directive packet and medical alert resources that were mailed for a second time on 04/01/19.  No answer or option to leave message at home number.  Left message on mobile number.  Will attempt to reach again within four business days.  Ronn Melena, BSW Social Worker [phone]

## 2019-04-17 ENCOUNTER — Emergency Department (HOSPITAL_COMMUNITY): Payer: HMO

## 2019-04-17 ENCOUNTER — Other Ambulatory Visit: Payer: Self-pay

## 2019-04-17 ENCOUNTER — Ambulatory Visit: Payer: Self-pay

## 2019-04-17 ENCOUNTER — Observation Stay (HOSPITAL_COMMUNITY)
Admission: EM | Admit: 2019-04-17 | Discharge: 2019-04-18 | Disposition: A | Discharge status: Home / Self Care | Payer: HMO | Attending: Student | Admitting: Student

## 2019-04-17 ENCOUNTER — Encounter (HOSPITAL_COMMUNITY): Payer: Self-pay | Admitting: Emergency Medicine

## 2019-04-17 DIAGNOSIS — Z6834 Body mass index (BMI) 34.0-34.9, adult: Secondary | ICD-10-CM | POA: Insufficient documentation

## 2019-04-17 DIAGNOSIS — Z794 Long term (current) use of insulin: Secondary | ICD-10-CM | POA: Diagnosis not present

## 2019-04-17 DIAGNOSIS — E1165 Type 2 diabetes mellitus with hyperglycemia: Secondary | ICD-10-CM

## 2019-04-17 DIAGNOSIS — Z87891 Personal history of nicotine dependence: Secondary | ICD-10-CM | POA: Diagnosis not present

## 2019-04-17 DIAGNOSIS — IMO0001 Reserved for inherently not codable concepts without codable children: Secondary | ICD-10-CM

## 2019-04-17 DIAGNOSIS — E876 Hypokalemia: Secondary | ICD-10-CM | POA: Diagnosis not present

## 2019-04-17 DIAGNOSIS — J849 Interstitial pulmonary disease, unspecified: Secondary | ICD-10-CM | POA: Diagnosis present

## 2019-04-17 DIAGNOSIS — R188 Other ascites: Secondary | ICD-10-CM | POA: Diagnosis not present

## 2019-04-17 DIAGNOSIS — K7581 Nonalcoholic steatohepatitis (NASH): Secondary | ICD-10-CM | POA: Diagnosis present

## 2019-04-17 DIAGNOSIS — Z955 Presence of coronary angioplasty implant and graft: Secondary | ICD-10-CM | POA: Insufficient documentation

## 2019-04-17 DIAGNOSIS — K7290 Hepatic failure, unspecified without coma: Secondary | ICD-10-CM

## 2019-04-17 DIAGNOSIS — E119 Type 2 diabetes mellitus without complications: Secondary | ICD-10-CM | POA: Insufficient documentation

## 2019-04-17 DIAGNOSIS — F329 Major depressive disorder, single episode, unspecified: Secondary | ICD-10-CM | POA: Diagnosis not present

## 2019-04-17 DIAGNOSIS — G629 Polyneuropathy, unspecified: Secondary | ICD-10-CM | POA: Diagnosis not present

## 2019-04-17 DIAGNOSIS — R0602 Shortness of breath: Secondary | ICD-10-CM | POA: Insufficient documentation

## 2019-04-17 DIAGNOSIS — E039 Hypothyroidism, unspecified: Secondary | ICD-10-CM | POA: Diagnosis not present

## 2019-04-17 DIAGNOSIS — E669 Obesity, unspecified: Secondary | ICD-10-CM | POA: Insufficient documentation

## 2019-04-17 DIAGNOSIS — I5032 Chronic diastolic (congestive) heart failure: Secondary | ICD-10-CM | POA: Diagnosis present

## 2019-04-17 DIAGNOSIS — E559 Vitamin D deficiency, unspecified: Secondary | ICD-10-CM | POA: Insufficient documentation

## 2019-04-17 DIAGNOSIS — I5023 Acute on chronic systolic (congestive) heart failure: Secondary | ICD-10-CM | POA: Diagnosis not present

## 2019-04-17 DIAGNOSIS — Z20828 Contact with and (suspected) exposure to other viral communicable diseases: Secondary | ICD-10-CM | POA: Diagnosis not present

## 2019-04-17 DIAGNOSIS — Z79890 Hormone replacement therapy (postmenopausal): Secondary | ICD-10-CM | POA: Insufficient documentation

## 2019-04-17 DIAGNOSIS — G4733 Obstructive sleep apnea (adult) (pediatric): Secondary | ICD-10-CM | POA: Diagnosis not present

## 2019-04-17 DIAGNOSIS — D61818 Other pancytopenia: Secondary | ICD-10-CM | POA: Insufficient documentation

## 2019-04-17 DIAGNOSIS — I480 Paroxysmal atrial fibrillation: Secondary | ICD-10-CM | POA: Diagnosis present

## 2019-04-17 DIAGNOSIS — K7460 Unspecified cirrhosis of liver: Secondary | ICD-10-CM | POA: Insufficient documentation

## 2019-04-17 DIAGNOSIS — Z79899 Other long term (current) drug therapy: Secondary | ICD-10-CM | POA: Diagnosis not present

## 2019-04-17 DIAGNOSIS — J449 Chronic obstructive pulmonary disease, unspecified: Secondary | ICD-10-CM | POA: Diagnosis not present

## 2019-04-17 DIAGNOSIS — I5042 Chronic combined systolic (congestive) and diastolic (congestive) heart failure: Secondary | ICD-10-CM | POA: Diagnosis not present

## 2019-04-17 DIAGNOSIS — I152 Hypertension secondary to endocrine disorders: Secondary | ICD-10-CM | POA: Diagnosis present

## 2019-04-17 DIAGNOSIS — R222 Localized swelling, mass and lump, trunk: Secondary | ICD-10-CM | POA: Diagnosis not present

## 2019-04-17 DIAGNOSIS — I110 Hypertensive heart disease with heart failure: Secondary | ICD-10-CM | POA: Diagnosis not present

## 2019-04-17 DIAGNOSIS — I2510 Atherosclerotic heart disease of native coronary artery without angina pectoris: Secondary | ICD-10-CM | POA: Diagnosis not present

## 2019-04-17 DIAGNOSIS — I252 Old myocardial infarction: Secondary | ICD-10-CM | POA: Diagnosis not present

## 2019-04-17 DIAGNOSIS — E118 Type 2 diabetes mellitus with unspecified complications: Secondary | ICD-10-CM | POA: Diagnosis not present

## 2019-04-17 DIAGNOSIS — E1159 Type 2 diabetes mellitus with other circulatory complications: Secondary | ICD-10-CM | POA: Diagnosis present

## 2019-04-17 LAB — COMPREHENSIVE METABOLIC PANEL
ALT: 16 U/L (ref 0–44)
AST: 30 U/L (ref 15–41)
Albumin: 2.4 g/dL — ABNORMAL LOW (ref 3.5–5.0)
Alkaline Phosphatase: 165 U/L — ABNORMAL HIGH (ref 38–126)
Anion gap: 8 (ref 5–15)
BUN: <5 mg/dL — ABNORMAL LOW (ref 8–23)
CO2: 29 mmol/L (ref 22–32)
Calcium: 8.2 mg/dL — ABNORMAL LOW (ref 8.9–10.3)
Chloride: 103 mmol/L (ref 98–111)
Creatinine, Ser: 0.82 mg/dL (ref 0.44–1.00)
GFR calc Af Amer: >60 mL/min (ref >60)
GFR calc non Af Amer: >60 mL/min (ref >60)
Glucose, Bld: 131 mg/dL — ABNORMAL HIGH (ref 70–99)
Potassium: 3.1 mmol/L — ABNORMAL LOW (ref 3.5–5.1)
Sodium: 140 mmol/L (ref 135–145)
Total Bilirubin: 1.5 mg/dL — ABNORMAL HIGH (ref 0.3–1.2)
Total Protein: 6.2 g/dL — ABNORMAL LOW (ref 6.5–8.1)

## 2019-04-17 LAB — CBC WITH DIFFERENTIAL/PLATELET
Abs Immature Granulocytes: 0.02 10*3/uL (ref 0.00–0.07)
Basophils Absolute: 0 10*3/uL (ref 0.0–0.1)
Basophils Relative: 1 %
Eosinophils Absolute: 0.2 10*3/uL (ref 0.0–0.5)
Eosinophils Relative: 4 %
HCT: 40.7 % (ref 36.0–46.0)
Hemoglobin: 13.1 g/dL (ref 12.0–15.0)
Immature Granulocytes: 1 %
Lymphocytes Relative: 15 %
Lymphs Abs: 0.6 10*3/uL — ABNORMAL LOW (ref 0.7–4.0)
MCH: 29.2 pg (ref 26.0–34.0)
MCHC: 32.2 g/dL (ref 30.0–36.0)
MCV: 90.6 fL (ref 80.0–100.0)
Monocytes Absolute: 0.7 10*3/uL (ref 0.1–1.0)
Monocytes Relative: 18 %
Neutro Abs: 2.4 10*3/uL (ref 1.7–7.7)
Neutrophils Relative %: 61 %
Platelets: 78 10*3/uL — ABNORMAL LOW (ref 150–400)
RBC: 4.49 MIL/uL (ref 3.87–5.11)
RDW: 15.6 % — ABNORMAL HIGH (ref 11.5–15.5)
WBC: 3.9 10*3/uL — ABNORMAL LOW (ref 4.0–10.5)
nRBC: 0 % (ref 0.0–0.2)

## 2019-04-17 LAB — SARS CORONAVIRUS 2 BY RT PCR (HOSPITAL ORDER, PERFORMED IN ~~LOC~~ HOSPITAL LAB): SARS Coronavirus 2: NEGATIVE

## 2019-04-17 LAB — TROPONIN I (HIGH SENSITIVITY)
Troponin I (High Sensitivity): 9 ng/L (ref <18)
Troponin I (High Sensitivity): 10 ng/L (ref <18)

## 2019-04-17 LAB — BRAIN NATRIURETIC PEPTIDE: B Natriuretic Peptide: 150.2 pg/mL — ABNORMAL HIGH (ref 0.0–100.0)

## 2019-04-17 MED ORDER — FUROSEMIDE 10 MG/ML IJ SOLN
40.0000 mg | Freq: Once | INTRAMUSCULAR | Status: AC
  Administered 2019-04-17: 20:00:00 40 mg via INTRAVENOUS
  Filled 2019-04-17: qty 4

## 2019-04-17 MED ORDER — ESCITALOPRAM OXALATE 10 MG PO TABS
20.0000 mg | ORAL_TABLET | Freq: Every day | ORAL | Status: DC
  Administered 2019-04-18: 20 mg via ORAL
  Filled 2019-04-17: qty 2

## 2019-04-17 MED ORDER — MONTELUKAST SODIUM 10 MG PO TABS
10.0000 mg | ORAL_TABLET | Freq: Every evening | ORAL | Status: DC
  Administered 2019-04-17: 23:00:00 10 mg via ORAL
  Filled 2019-04-17: qty 1

## 2019-04-17 MED ORDER — ACETAMINOPHEN 325 MG PO TABS
650.0000 mg | ORAL_TABLET | Freq: Four times a day (QID) | ORAL | Status: DC | PRN
  Administered 2019-04-18: 02:00:00 650 mg via ORAL
  Filled 2019-04-17: qty 2

## 2019-04-17 MED ORDER — NITROGLYCERIN 0.4 MG SL SUBL: 0.4000 mg | SUBLINGUAL_TABLET | SUBLINGUAL | Status: DC | PRN

## 2019-04-17 MED ORDER — ALBUTEROL SULFATE (2.5 MG/3ML) 0.083% IN NEBU: 3.0000 mL | INHALATION_SOLUTION | Freq: Four times a day (QID) | RESPIRATORY_TRACT | Status: DC | PRN

## 2019-04-17 MED ORDER — SPIRONOLACTONE 25 MG PO TABS: 25.0000 mg | ORAL_TABLET | Freq: Every day | ORAL | Status: DC

## 2019-04-17 MED ORDER — METOPROLOL TARTRATE 12.5 MG HALF TABLET
12.5000 mg | ORAL_TABLET | Freq: Two times a day (BID) | ORAL | Status: DC
  Administered 2019-04-18 (×2): 12.5 mg via ORAL
  Filled 2019-04-17 (×2): qty 1

## 2019-04-17 MED ORDER — FUROSEMIDE 40 MG PO TABS
40.0000 mg | ORAL_TABLET | Freq: Two times a day (BID) | ORAL | Status: DC
  Administered 2019-04-18: 08:00:00 40 mg via ORAL
  Filled 2019-04-17: qty 1

## 2019-04-17 MED ORDER — PANTOPRAZOLE SODIUM 20 MG PO TBEC
20.0000 mg | DELAYED_RELEASE_TABLET | Freq: Two times a day (BID) | ORAL | Status: DC
  Administered 2019-04-18 (×2): 20 mg via ORAL
  Filled 2019-04-17 (×2): qty 1

## 2019-04-17 MED ORDER — INSULIN GLARGINE 100 UNIT/ML ~~LOC~~ SOLN
20.0000 [IU] | Freq: Two times a day (BID) | SUBCUTANEOUS | Status: DC
  Administered 2019-04-18 (×2): 20 [IU] via SUBCUTANEOUS
  Filled 2019-04-17 (×3): qty 0.2

## 2019-04-17 MED ORDER — CYCLOSPORINE 0.05 % OP EMUL
1.0000 [drp] | Freq: Two times a day (BID) | OPHTHALMIC | Status: DC
  Administered 2019-04-18 (×2): 1 [drp] via OPHTHALMIC
  Filled 2019-04-17 (×3): qty 30

## 2019-04-17 MED ORDER — INSULIN ASPART 100 UNIT/ML ~~LOC~~ SOLN
0.0000 [IU] | Freq: Three times a day (TID) | SUBCUTANEOUS | Status: DC
  Administered 2019-04-18: 12:00:00 2 [IU] via SUBCUTANEOUS

## 2019-04-17 MED ORDER — ISOSORBIDE MONONITRATE ER 30 MG PO TB24
30.0000 mg | ORAL_TABLET | Freq: Every day | ORAL | Status: DC
  Administered 2019-04-18: 30 mg via ORAL
  Filled 2019-04-17: qty 1

## 2019-04-17 MED ORDER — POTASSIUM CHLORIDE 20 MEQ/15ML (10%) PO SOLN
40.0000 meq | Freq: Once | ORAL | Status: AC
  Administered 2019-04-17: 20:00:00 40 meq via ORAL
  Filled 2019-04-17: qty 30

## 2019-04-17 MED ORDER — ACETAMINOPHEN 650 MG RE SUPP
650.0000 mg | Freq: Four times a day (QID) | RECTAL | Status: DC | PRN
  Filled 2019-04-17: qty 1

## 2019-04-17 MED ORDER — LEVOFLOXACIN IN D5W 750 MG/150ML IV SOLN
750.0000 mg | INTRAVENOUS | Status: DC
  Administered 2019-04-18: 01:00:00 750 mg via INTRAVENOUS
  Filled 2019-04-17 (×2): qty 150

## 2019-04-17 MED ORDER — LEVOTHYROXINE SODIUM 100 MCG PO TABS
100.0000 ug | ORAL_TABLET | Freq: Every day | ORAL | Status: DC
  Administered 2019-04-18: 06:00:00 100 ug via ORAL
  Filled 2019-04-17: qty 1

## 2019-04-17 MED ORDER — ONDANSETRON HCL 4 MG/2ML IJ SOLN: 4.0000 mg | Freq: Four times a day (QID) | INTRAMUSCULAR | Status: DC | PRN

## 2019-04-17 MED ORDER — ONDANSETRON HCL 4 MG PO TABS: 4.0000 mg | ORAL_TABLET | Freq: Four times a day (QID) | ORAL | Status: DC | PRN

## 2019-04-17 MED ORDER — BRIMONIDINE TARTRATE 0.2 % OP SOLN
1.0000 [drp] | Freq: Every day | OPHTHALMIC | Status: DC
  Administered 2019-04-18: 08:00:00 1 [drp] via OPHTHALMIC
  Filled 2019-04-17: qty 5

## 2019-04-17 NOTE — ED Provider Notes (Addendum)
[facility] EMERGENCY DEPARTMENT Provider Note   CSN: [id number] Arrival date & time: 04/17/19  1532     History   Chief Complaint Chief Complaint  Patient presents with  . abd swelling    HPI [name] is a 85 y.o. female. With history of ascites d/t NASH, DM, CHF (2/2 old MI) and prior hospitalization for paracentesis.  Daughter at bedside with patient to assist with history. Symptoms began two days ago and include weakness, weight gain 10 lbs, and abdominal distention.  Patient states she pees 2-3 times per night and has been sleeping more and eating less over the past 3 days.   Patient denies chest pain, fever, chills, abdominal pain, diarrhea, nausea, vomiting change in bowel habits, or stool appearance.  Daughter states that she has noticed her mother having increased difficulty walking and conducting activities of daily living over the past 2 days.  She also states that her mother has been more short of breath and wheezing more over that period. Per daughter: Dry weight around 173 today patient's daughter states 63; patient lives alone but daughter puts meds together for her each week to ensure she does not miss a dose.    Patient states she sees her cardiologist regularly before COVID-19.  HPI  Past Medical History:  Diagnosis Date  . Cirrhosis, non-alcoholic ([place])   . Coronary artery disease    status post inferior wall myocardial infarction  . Diabetes mellitus   . Dyslipidemia   . Hypotension 08/02/2010   recent episodes of orthostatic hypotension  . Hypothyroidism   . Leg edema   . Myocardial infarct, old   . Neuropathy of hand    left hand numbness/neuropathy  . Obesity   . Sleep apnea     Patient Active Problem List   Diagnosis Date Noted  . Myocardial infarct, old 01/01/2019  . Paroxysmal atrial fibrillation ([place]) 10/28/2018  . Cirrhosis of liver with ascites ([place]) 10/16/2018  . Pancytopenia ([place]) 09/26/2018  . COPD  exacerbation ([place]) 09/26/2018  . Acute on chronic diastolic CHF (congestive heart failure) ([place]) 09/26/2018  . Acute on chronic respiratory failure with hypoxia ([place]) 09/26/2018  . PNA (pneumonia) 09/15/2018  . Microalbuminuria due to type 2 diabetes mellitus ([place]) 08/26/2018  . Hypocalcemia 03/12/2018  . Gastroesophageal reflux disease 12/31/2017  . chronic Low platelet count 06/05/2017  . Hypertension associated with diabetes ([place]) 05/22/2017  . Morbidly obese ([place])- BMI >er 35 w DM 06/23/2016  . chronic Leukopenia 05/11/2016  . Vitamin D deficiency 05/11/2016  . Elevated serum alkaline phosphatase level- hepatocellular in origin 05/11/2016  . Depression 03/13/2016  . Hypothyroidism 03/13/2016  . Insulin dependent diabetes mellitus with complications ([place]) 87/86/7672  . Mixed diabetic hyperlipidemia associated with type 1 diabetes mellitus ([place]) 03/13/2016  . Constipation, chronic/  IBS syndrome 03/13/2016  . At high risk for osteoporosis 03/13/2016  . Hypotension 12/13/2012  . Hypokalemia 12/13/2012  . Thrombocytopenia, unspecified ([place]) 12/13/2012  . h/o Clostridium difficile colitis 12/12/2012  . History of GI bleed 12/12/2012  . ILD (interstitial lung disease) ([place]) 11/25/2012  . Leg edema 01/07/2012  . Coronary artery disease 04/11/2011  . COPD (chronic obstructive pulmonary disease) ([place]) 04/11/2011  . Obstructive sleep apnea 04/11/2011  . Chronic combined systolic (congestive) and diastolic (congestive) heart failure ([place]) 03/29/2008  . Atherosclerosis of carotid arteries 04/03/2006    Past Surgical History:  Procedure Laterality Date  . ABDOMINAL HYSTERECTOMY    . CARDIAC CATHETERIZATION     The  ejection fraction is around 50%  . CHOLECYSTECTOMY    . CORONARY STENT PLACEMENT    . ESOPHAGOGASTRODUODENOSCOPY N/A 12/12/2012   Procedure: ESOPHAGOGASTRODUODENOSCOPY (EGD);  Surgeon: Florencia Reasons, MD;  Location: [facility];  Service: Endoscopy;  Laterality: N/A;  .  FLEXIBLE SIGMOIDOSCOPY N/A 12/12/2012   Procedure: FLEXIBLE SIGMOIDOSCOPY;  Surgeon: Florencia Reasons, MD;  Location: [facility];  Service: Endoscopy;  Laterality: N/A;  . IR PARACENTESIS  09/29/2018  . IR PARACENTESIS  10/01/2018  . TUBAL LIGATION    . WRIST SURGERY       OB History   No obstetric history on file.      Home Medications    Prior to Admission medications   Medication Sig Start Date End Date Taking? Authorizing Provider  albuterol (PROAIR HFA) 108 (90 Base) MCG/ACT inhaler Inhale 2 puffs into the lungs every 6 (six) hours as needed for wheezing or shortness of breath. 09/17/18   Opalski, Gavin Pound, DO  Blood Glucose Monitoring Suppl (FREESTYLE LITE) DEVI Use to check blood sugars 2-3 times daily 01/07/17   Opalski, Gavin Pound, DO  Carboxymeth-Glyc-Polysorb PF (REFRESH OPTIVE MEGA-3) 0.5-1-0.5 % SOLN Apply 1-2 drops to eye 2 (two) times daily.     [provider]  Cholecalciferol (VITAMIN D3) 50 MCG (2000 UT) TABS Take 1 tablet by mouth daily.    [provider]  EASY TOUCH PEN NEEDLES 31G X 5 MM MISC USE TO INJECT INSULIN 5 TIMES A DAY 07/25/18   Opalski, Deborah, DO  escitalopram (LEXAPRO) 20 MG tablet TAKE 1 TABLET (20 MG TOTAL) BY MOUTH DAILY. 05/21/18   Opalski, Gavin Pound, DO  furosemide (LASIX) 40 MG tablet Take 1 tablet (40 mg total) by mouth 2 (two) times daily. 11/03/18   Berton Bon, NP  insulin aspart (NOVOLOG FLEXPEN) 100 UNIT/ML FlexPen Inject 10-40 units into ski 3 times daily with meals. Adjust as needed. 11/11/18   Opalski, Gavin Pound, DO  Insulin Glargine (LANTUS SOLOSTAR) 100 UNIT/ML Solostar Pen Inject 20 Units into the skin 2 (two) times daily. Patient uses sliding scale for Lantus 20-60 units against medical advice    [provider]  isosorbide mononitrate (IMDUR) 30 MG 24 hr tablet Take 1 tablet (30 mg total) by mouth daily. 03/30/19   Nahser, Deloris Ping, MD  Lancets [facility] DELICA PLUS [place]) MISC USE TO CHECK BLOOD SUGAR THREE TIMES  A DAY 11/10/18   Thomasene Lot, DO  levothyroxine (SYNTHROID) 100 MCG tablet TAKE 1 TABLET BY MOUTH DAILY 04/06/19   Opalski, Gavin Pound, DO  loratadine (CLARITIN) 10 MG tablet Take 1 tablet (10 mg total) by mouth daily. Patient taking differently: Take 10 mg by mouth daily as needed.  12/02/18   Opalski, Gavin Pound, DO  metoprolol tartrate (LOPRESSOR) 25 MG tablet Take 0.5 tablets (12.5 mg total) by mouth 2 (two) times daily. 03/30/19   Nahser, Deloris Ping, MD  montelukast (SINGULAIR) 10 MG tablet TAKE 1 TABLET BY MOUTH EVERY EVENING. 03/09/19   Opalski, Deborah, DO  Multiple Vitamins-Minerals (VISION FORMULA/LUTEIN) TABS Take 1 tablet by mouth daily.    [provider]  nitroGLYCERIN (NITROSTAT) 0.4 MG SL tablet Place 1 tab under the tongue every 5 minutes as needed for chest pain. 11/10/18   Nahser, Deloris Ping, MD  [facility] ULTRA test strip TEST BLOOD SUGAR 2-3 TIMES A DAY 04/06/19   Opalski, Gavin Pound, DO  pantoprazole (PROTONIX) 20 MG tablet Take 1 tablet (20 mg total) by mouth 2 (two) times daily. 09/17/18   Thomasene Lot, DO  Polyethyl  Glycol-Propyl Glycol (SYSTANE) 0.4-0.3 % SOLN Apply 1 drop to eye 4 (four) times daily.     [provider]  Propylene Glycol (SYSTANE BALANCE) 0.6 % SOLN Apply 1 drop to eye 2 (two) times daily.    [provider]  RESTASIS 0.05 % ophthalmic emulsion 1 drop both eyes twice daily 12/31/18   [provider]  spironolactone (ALDACTONE) 25 MG tablet Take 2 tablets (50 mg total) by mouth daily. Patient taking differently: Take 25 mg by mouth daily.  11/03/18   Berton BonHammond, Janine, NP  White Petrolatum-Mineral Oil (GENTEAL TEARS NIGHT-TIME) OINT Apply 1 application to eye at bedtime.    [provider]  White Petrolatum-Mineral Oil (SYSTANE NIGHTTIME) OINT Place 1 application into both eyes at bedtime as needed (irritation).     [provider]    Family History Family History  Problem Relation Age of Onset  . Emphysema Brother         non smoker  . Diabetes Brother   . Hyperlipidemia Brother   . Hypertension Brother   . Heart disease Father   . Heart attack Father   . Diabetes Father   . Hyperlipidemia Father   . Hypertension Father   . Diabetes Mother   . Diabetes Sister   . Hyperlipidemia Sister   . Hypertension Sister   . Heart attack Daughter   . Diabetes Daughter   . Diabetes Son   . Suicidality Son   . Alcohol abuse Son   . Alcohol abuse Maternal Uncle   . Heart attack Paternal Grandfather   . Diabetes Daughter   . Cancer Son        melanoma  . Diabetes Son   . Diabetes Son     Social History Social History   Tobacco Use  . Smoking status: Former Smoker    Quit date: 08/20/1973    Years since quitting: 45.6  . Smokeless tobacco: Never Used  Substance Use Topics  . Alcohol use: No  . Drug use: No     Allergies   Ace inhibitors, Benadryl [diphenhydramine hcl (sleep)], Fexofenadine hcl, Penicillins, and Sulfonamide derivatives   Review of Systems Review of Systems  Constitutional: Positive for unexpected weight change. Negative for chills and fever.  HENT: Negative for congestion, hearing loss, sinus pressure and sore throat.   Eyes: Negative for pain and redness.  Respiratory: Positive for shortness of breath. Negative for cough.   Cardiovascular: Positive for leg swelling. Negative for chest pain.  Gastrointestinal: Positive for abdominal distention. Negative for abdominal pain, diarrhea, nausea and vomiting.  Endocrine: Negative for polyphagia.  Genitourinary: Negative for difficulty urinating, dysuria and hematuria.  Musculoskeletal: Negative for arthralgias.  Skin: Negative for rash.  Neurological: Positive for weakness. Negative for dizziness.  Psychiatric/Behavioral: Positive for sleep disturbance (Due to nocturia).     Physical Exam Updated Vital Signs BP (!) 114/43 (BP Location: Right Arm) Comment: taked twice in booth arms  Pulse (!) 57   Temp 98.7 F (37.1 C)  (Oral)   Resp 16   Ht 5\' 1"  (1.549 m)   Wt 82.6 kg   SpO2 98%   BMI 34.39 kg/m   Physical Exam Constitutional:      General: She is not in acute distress. HENT:     Head: Normocephalic and atraumatic.     Right Ear: External ear normal.     Left Ear: External ear normal.     Nose: Nose normal.     Mouth/Throat:  Mouth: Mucous membranes are moist.  Eyes:     Conjunctiva/sclera: Conjunctivae normal.  Neck:     Musculoskeletal: Normal range of motion.  Cardiovascular:     Rate and Rhythm: Normal rate and regular rhythm.     Pulses: Normal pulses.     Heart sounds: Murmur present. No friction rub. No gallop.   Pulmonary:     Effort: Pulmonary effort is normal. No respiratory distress.     Breath sounds: No stridor. Rales (bilateral lower lobes) present.  Abdominal:     General: Bowel sounds are normal. There is distension.     Palpations: Abdomen is soft.     Tenderness: There is no abdominal tenderness. There is no guarding.     Hernia: A hernia (small ventral hernia that is easily reduced and is nontender) is present.     Comments: Patient has positive fluid wave and significant abdominal distention that is dull to percussion.    Musculoskeletal:     Right lower leg: Edema (1+) present.     Left lower leg: Edema (1+) present.  Skin:    General: Skin is warm and dry.     Capillary Refill: Capillary refill takes less than 2 seconds.  Neurological:     Mental Status: She is alert. Mental status is at baseline.  Psychiatric:        Mood and Affect: Mood normal.        Behavior: Behavior normal.      ED Treatments / Results  Labs (all labs ordered are listed, but only abnormal results are displayed) Labs Reviewed  CBC WITH DIFFERENTIAL/PLATELET - Abnormal; Notable for the following components:      Result Value   WBC 3.9 (*)    RDW 15.6 (*)    Platelets 78 (*)    Lymphs Abs 0.6 (*)    All other components within normal limits  COMPREHENSIVE METABOLIC PANEL -  Abnormal; Notable for the following components:   Potassium 3.1 (*)    Glucose, Bld 131 (*)    BUN <5 (*)    Calcium 8.2 (*)    Total Protein 6.2 (*)    Albumin 2.4 (*)    Alkaline Phosphatase 165 (*)    Total Bilirubin 1.5 (*)    All other components within normal limits  BRAIN NATRIURETIC PEPTIDE - Abnormal; Notable for the following components:   B Natriuretic Peptide 150.2 (*)    All other components within normal limits    EKG None  Radiology No results found.  Procedures Procedures (including critical care time)  Medications Ordered in ED Medications - No data to display   Initial Impression / Assessment and Plan / ED Course  I have reviewed the triage vital signs and the nursing notes.  Pertinent labs & imaging results that were available during my care of the patient were reviewed by me and considered in my medical decision making (see chart for details).  Patient summary: Patient is 85 year old female with history of Elita Boone and CHF and prior hospitalization for ascites requiring paracentesis and diuresis.  Patient presentation concerning because of symptomatic ascites.  The symptoms include decreased appetite, nocturia 2-3x per night, decreased ability to ambulate at home and has been sleeping more and less active in general per daughter.  Physical exam is significant for volume overload with significant ascites and mild lower extremity edema, as well as crackles in bilateral lung bases.  Labs and imaging included basic blood work, negative troponin, mild hypokalemia of 3.1,  mild hypocalcemia 8.2.  Patient also has elevated BNP consistent with fluid overload picture.  Urine results pending at time of departure from ED. COVID test collected in ED.  On review of EMR, patient had 3.3 L removed during her last hospitalization in February.  No organisms were detected and the peritoneal fluid samples taken at that time.  Patient states her symptoms were improved after her  paracentesis.  Patient given 40 mEq of K+.  Calcium repletion deferred at this time.  QTc of 501 slightly prolonged; no repeat EKG is necessary at this time.   Disposition: Patient will be admitted to hospitalist service. [place] discussed case with hospitalist. Patient may be in need of therapeutic paracentesis low likelihood of SBP considering presentation today.        Final Clinical Impressions(s) / ED Diagnoses   Final diagnoses:  None    ED Discharge Orders    None       Gailen ShelterFondaw, Jagger Demonte S, [place] 04/17/19 2217    Gailen ShelterFondaw, Mady Oubre S, [place] 04/17/19 2303    Jacalyn LefevreHaviland, Julie, MD 04/17/19 2329    Solon AugustaFondaw, Luree Palla [place], [place] 04/17/19 2346    Jacalyn LefevreHaviland, Julie, MD 04/19/19 [phone]

## 2019-04-17 NOTE — H&P (Signed)
History and Physical    [name] NLG:[id number] DOB: [birth year] DOA: 04/17/2019  PCP: Mellody Dance, DO  Patient coming from: Home.  Chief Complaint: Distended abdomen.  HPI: [name] is a 85 y.o. female with history of cirrhosis of liver secondary to [facility], chronic combined CHF, diabetes mellitus, pancytopenia, COPD, sleep apnea was brought to the ER after patient's family found that patient has been having increasing distention of the abdomen.  Per patient's daughter provided the history patient has gained at least 10 pounds over the last 3 weeks.  At times have shortness of breath.  Has been compliant with her Lasix spironolactone and diet.  Last week patient had a brief episode of nausea vomiting and diarrhea which was self-limited.  Has not had any fever chills.  ED Course: In the ER on exam patient has distended abdomen.  Chest x-ray unremarkable.  COVID-19 test was negative.  BNP 150.2.  Labs are largely at baseline.  Patient was given Lasix 40 mg IV and admitted for decompensated cirrhosis of liver with ascites.  Review of Systems: As per HPI, rest all negative.   Past Medical History:  Diagnosis Date   Cirrhosis, non-alcoholic ([place])    Coronary artery disease    status post inferior wall myocardial infarction   Diabetes mellitus    Dyslipidemia    Hypotension 08/02/2010   recent episodes of orthostatic hypotension   Hypothyroidism    Leg edema    Myocardial infarct, old    Neuropathy of hand    left hand numbness/neuropathy   Obesity    Sleep apnea     Past Surgical History:  Procedure Laterality Date   ABDOMINAL HYSTERECTOMY     CARDIAC CATHETERIZATION     The ejection fraction is around 50%   CHOLECYSTECTOMY     CORONARY STENT PLACEMENT     ESOPHAGOGASTRODUODENOSCOPY N/A 12/12/2012   Procedure: ESOPHAGOGASTRODUODENOSCOPY (EGD);  Surgeon: [name], MD;  Location: [name];  Service: Endoscopy;  Laterality: N/A;    FLEXIBLE SIGMOIDOSCOPY N/A 12/12/2012   Procedure: FLEXIBLE SIGMOIDOSCOPY;  Surgeon: [name], MD;  Location: [facility];  Service: Endoscopy;  Laterality: N/A;   IR PARACENTESIS  09/29/2018   IR PARACENTESIS  10/01/2018   TUBAL LIGATION     WRIST SURGERY       reports that she quit smoking about 45 years ago. She has never used smokeless tobacco. She reports that she does not drink alcohol or use drugs.  Allergies  Allergen Reactions   Ace Inhibitors Cough   Benadryl [Diphenhydramine Hcl (Sleep)] Other (See Comments)    hypotension   Fexofenadine Hcl Other (See Comments)   Penicillins Hives and Swelling    Swelling of arms Has patient had a PCN reaction causing immediate rash, facial/tongue/throat swelling, SOB or lightheadedness with hypotension: unknown Has patient had a PCN reaction causing severe rash involving mucus membranes or skin necrosis: no Has patient had a PCN reaction that required hospitalization: unknown Has patient had a PCN reaction occurring within the last 10 years: no, childhood allergy If all of the above answers are "NO", then may proceed with Cephalosporin use.    Sulfonamide Derivatives Swelling    Family History  Problem Relation Age of Onset   Emphysema Brother        non smoker   Diabetes Brother    Hyperlipidemia Brother    Hypertension Brother    Heart disease Father    Heart attack Father    Diabetes  Father    Hyperlipidemia Father    Hypertension Father    Diabetes Mother    Diabetes Sister    Hyperlipidemia Sister    Hypertension Sister    Heart attack Daughter    Diabetes Daughter    Diabetes Son    Suicidality Son    Alcohol abuse Son    Alcohol abuse Maternal Uncle    Heart attack Paternal Grandfather    Diabetes Daughter    Cancer Son        melanoma   Diabetes Son    Diabetes Son     Prior to Admission medications   Medication Sig Start Date End Date Taking? Authorizing Provider    albuterol (PROAIR HFA) 108 (90 Base) MCG/ACT inhaler Inhale 2 puffs into the lungs every 6 (six) hours as needed for wheezing or shortness of breath. 09/17/18  Yes Opalski, [name], DO  Blood Glucose Monitoring Suppl (FREESTYLE LITE) DEVI Use to check blood sugars 2-3 times daily 01/07/17  Yes Opalski, Deborah, DO  brimonidine (ALPHAGAN) 0.2 % ophthalmic solution Place 1 drop into both eyes daily. 03/04/19  Yes [provider]  Carboxymeth-Glyc-Polysorb PF (REFRESH OPTIVE MEGA-3) 0.5-1-0.5 % SOLN Apply 1-2 drops to eye 2 (two) times daily.    Yes [provider]  Cholecalciferol (VITAMIN D3) 50 MCG (2000 UT) TABS Take 1 tablet by mouth daily.   Yes [provider]  EASY TOUCH PEN NEEDLES 31G X 5 MM MISC USE TO INJECT INSULIN 5 TIMES A DAY 07/25/18  Yes Opalski, Deborah, DO  escitalopram (LEXAPRO) 20 MG tablet TAKE 1 TABLET (20 MG TOTAL) BY MOUTH DAILY. 05/21/18  Yes Opalski, [name], DO  furosemide (LASIX) 40 MG tablet Take 1 tablet (40 mg total) by mouth 2 (two) times daily. 11/03/18  Yes Daune Perch, NP  insulin aspart (NOVOLOG FLEXPEN) 100 UNIT/ML FlexPen Inject 10-40 units into ski 3 times daily with meals. Adjust as needed. 11/11/18  Yes Opalski, [name], DO  Insulin Glargine (LANTUS SOLOSTAR) 100 UNIT/ML Solostar Pen Inject 20 Units into the skin 2 (two) times daily. Patient uses sliding scale for Lantus 20-60 units against medical advice   Yes [provider]  isosorbide mononitrate (IMDUR) 30 MG 24 hr tablet Take 1 tablet (30 mg total) by mouth daily. 03/30/19  Yes Nahser, Wonda Cheng, MD  Lancets [facility] DELICA PLUS [id number]) MISC USE TO CHECK BLOOD SUGAR THREE TIMES A DAY 11/10/18  Yes Opalski, [name], DO  levothyroxine (SYNTHROID) 100 MCG tablet TAKE 1 TABLET BY MOUTH DAILY Patient taking differently: Take 100 mcg by mouth daily.  04/06/19  Yes Opalski, [name], DO  loratadine (CLARITIN) 10 MG tablet Take 1 tablet (10 mg total) by mouth daily. Patient taking  differently: Take 10 mg by mouth daily as needed.  12/02/18  Yes Opalski, [name], DO  metoprolol tartrate (LOPRESSOR) 25 MG tablet Take 0.5 tablets (12.5 mg total) by mouth 2 (two) times daily. 03/30/19  Yes Nahser, Wonda Cheng, MD  montelukast (SINGULAIR) 10 MG tablet TAKE 1 TABLET BY MOUTH EVERY EVENING. Patient taking differently: Take 10 mg by mouth every evening.  03/09/19  Yes Opalski, Deborah, DO  Multiple Vitamins-Minerals (VISION FORMULA/LUTEIN) TABS Take 1 tablet by mouth daily.   Yes [provider]  nitroGLYCERIN (NITROSTAT) 0.4 MG SL tablet Place 1 tab under the tongue every 5 minutes as needed for chest pain. 11/10/18  Yes Nahser, Wonda Cheng, MD  [facility] ULTRA test strip TEST BLOOD SUGAR 2-3 TIMES A DAY 04/06/19  Yes Opalski,  Deborah, DO  pantoprazole (PROTONIX) 20 MG tablet Take 1 tablet (20 mg total) by mouth 2 (two) times daily. 09/17/18  Yes Opalski, [name], DO  Propylene Glycol (SYSTANE BALANCE) 0.6 % SOLN Apply 1 drop to eye 2 (two) times daily.   Yes [provider]  RESTASIS 0.05 % ophthalmic emulsion Place 1 drop into both eyes 2 (two) times daily. 1 drop both eyes twice daily 12/31/18  Yes [provider]  spironolactone (ALDACTONE) 25 MG tablet Take 2 tablets (50 mg total) by mouth daily. Patient taking differently: Take 25 mg by mouth daily.  11/03/18  Yes Daune Perch, NP  White Petrolatum-Mineral Oil (GENTEAL TEARS NIGHT-TIME) OINT Apply 1 application to eye at bedtime.   Yes [provider]    Physical Exam: Constitutional: Moderately built and nourished. Vitals:   04/17/19 1845 04/17/19 1900 04/17/19 2015 04/17/19 2030  BP: (!) 123/56 (!) 113/99 (!) 117/56 (!) 121/58  Pulse: (!) 57 63 64 64  Resp:   15 12  Temp:      TempSrc:      SpO2: 98% 97% 96% 96%  Weight:      Height:       Eyes: Anicteric no pallor. ENMT: No discharge from the ears eyes nose or mouth. Neck: No mass felt.  No neck rigidity. Respiratory: No rhonchi or  crepitations. Cardiovascular: S1-S2 heard. Abdomen: Distended nontender bowel sounds present. Musculoskeletal: No edema. Skin: No rash. Neurologic: Alert awake oriented to time place and person.  Moves all extremities. Psychiatric: Appears normal from normal affect.   Labs on Admission: I have personally reviewed following labs and imaging studies  CBC: Recent Labs  Lab 04/17/19 1624  WBC 3.9*  NEUTROABS 2.4  HGB 13.1  HCT 40.7  MCV 90.6  PLT 78*   Basic Metabolic Panel: Recent Labs  Lab 04/17/19 1624  NA 140  K 3.1*  CL 103  CO2 29  GLUCOSE 131*  BUN <5*  CREATININE 0.82  CALCIUM 8.2*   GFR: Estimated Creatinine Clearance: 51.5 mL/min (by C-G formula based on SCr of 0.82 mg/dL). Liver Function Tests: Recent Labs  Lab 04/17/19 1624  AST 30  ALT 16  ALKPHOS 165*  BILITOT 1.5*  PROT 6.2*  ALBUMIN 2.4*   No results for input(s): LIPASE, AMYLASE in the last 168 hours. No results for input(s): AMMONIA in the last 168 hours. Coagulation Profile: No results for input(s): INR, PROTIME in the last 168 hours. Cardiac Enzymes: No results for input(s): CKTOTAL, CKMB, CKMBINDEX, TROPONINI in the last 168 hours. BNP (last 3 results) No results for input(s): PROBNP in the last 8760 hours. HbA1C: No results for input(s): HGBA1C in the last 72 hours. CBG: No results for input(s): GLUCAP in the last 168 hours. Lipid Profile: No results for input(s): CHOL, HDL, LDLCALC, TRIG, CHOLHDL, LDLDIRECT in the last 72 hours. Thyroid Function Tests: No results for input(s): TSH, T4TOTAL, FREET4, T3FREE, THYROIDAB in the last 72 hours. Anemia Panel: No results for input(s): VITAMINB12, FOLATE, FERRITIN, TIBC, IRON, RETICCTPCT in the last 72 hours. Urine analysis:    Component Value Date/Time   COLORURINE YELLOW 01/13/2016 2026   APPEARANCEUR CLEAR 01/13/2016 2026   LABSPEC 1.009 01/13/2016 2026   PHURINE 6.5 01/13/2016 2026   GLUCOSEU 250 (A) 01/13/2016 2026   HGBUR  NEGATIVE 01/13/2016 2026   BILIRUBINUR NEGATIVE 01/13/2016 2026   KETONESUR NEGATIVE 01/13/2016 2026   PROTEINUR NEGATIVE 01/13/2016 2026   UROBILINOGEN 0.2 12/12/2012 0920   NITRITE NEGATIVE 01/13/2016 2026  LEUKOCYTESUR NEGATIVE 01/13/2016 2026   Sepsis Labs: @LABRCNTIP (procalcitonin:4,lacticidven:4) ) Recent Results (from the past 240 hour(s))  SARS Coronavirus 2 [facility] order, Performed in [facility] hospital lab) Nasopharyngeal Nasopharyngeal Swab     Status: None   Collection Time: 04/17/19  8:04 PM   Specimen: Nasopharyngeal Swab  Result Value Ref Range Status   SARS Coronavirus 2 NEGATIVE NEGATIVE Final    Comment: (NOTE) If result is NEGATIVE SARS-CoV-2 target nucleic acids are NOT DETECTED. The SARS-CoV-2 RNA is generally detectable in upper and lower  respiratory specimens during the acute phase of infection. The lowest  concentration of SARS-CoV-2 viral copies this assay can detect is 250  copies / mL. A negative result does not preclude SARS-CoV-2 infection  and should not be used as the sole basis for treatment or other  patient management decisions.  A negative result may occur with  improper specimen collection / handling, submission of specimen other  than nasopharyngeal swab, presence of viral mutation(s) within the  areas targeted by this assay, and inadequate number of viral copies  (<250 copies / mL). A negative result must be combined with clinical  observations, patient history, and epidemiological information. If result is POSITIVE SARS-CoV-2 target nucleic acids are DETECTED. The SARS-CoV-2 RNA is generally detectable in upper and lower  respiratory specimens dur ing the acute phase of infection.  Positive  results are indicative of active infection with SARS-CoV-2.  Clinical  correlation with patient history and other diagnostic information is  necessary to determine patient infection status.  Positive results do  not rule out bacterial infection  or co-infection with other viruses. If result is PRESUMPTIVE POSTIVE SARS-CoV-2 nucleic acids MAY BE PRESENT.   A presumptive positive result was obtained on the submitted specimen  and confirmed on repeat testing.  While 2019 novel coronavirus  (SARS-CoV-2) nucleic acids may be present in the submitted sample  additional confirmatory testing may be necessary for epidemiological  and / or clinical management purposes  to differentiate between  SARS-CoV-2 and other Sarbecovirus currently known to infect humans.  If clinically indicated additional testing with an alternate test  methodology [phone]) is advised. The SARS-CoV-2 RNA is generally  detectable in upper and lower respiratory sp ecimens during the acute  phase of infection. The expected result is Negative. Fact Sheet for Patients:  StrictlyIdeas.no Fact Sheet for Healthcare Providers: BankingDealers.co.za This test is not yet approved or cleared by the [place] FDA and has been authorized for detection and/or diagnosis of SARS-CoV-2 by FDA under an Emergency Use Authorization (EUA).  This EUA will remain in effect (meaning this test can be used) for the duration of the COVID-19 declaration under Section 564(b)(1) of the Act, 21 U.S.C. section 360bbb-3(b)(1), unless the authorization is terminated or revoked sooner. Performed at [facility], [place] [address] [place], [place] [address]      Radiological Exams on Admission: Dg Chest Portable 1 View  Result Date: 04/17/2019 CLINICAL DATA:  Swelling EXAM: PORTABLE CHEST 1 VIEW COMPARISON:  09/25/2018 FINDINGS: Cardiomegaly. No confluent airspace opacities, effusions or edema. No acute bony abnormality. IMPRESSION: Cardiomegaly.  No active disease. Electronically Signed   By: Rolm Baptise M.D.   On: 04/17/2019 18:33      Assessment/Plan Principal Problem:   Decompensated hepatic cirrhosis (HCC) Active Problems:    Chronic combined systolic (congestive) and diastolic (congestive) heart failure (HCC)   ILD (interstitial lung disease) (HCC)   Hypothyroidism   Insulin dependent diabetes mellitus with complications ([place])  Hypertension associated with diabetes ([place])   Cirrhosis of liver with ascites (HCC)   Paroxysmal atrial fibrillation ([place])    1. Decompensated liver cirrhosis with ascites -patient received 1 dose of Lasix 40 mg IV in the ER.  Keep patient on Lasix 40 mg p.o. twice daily home dose with spironolactone.  We will order ultrasound-guided paracentesis.  Since patient may require large-volume paracentesis we will keep patient on albumin.  Empiric antibiotics for now until we get labs to make sure there is no SBP. 2. Diabetes mellitus type 2 on Lantus insulin place patient on sliding scale coverage. 3. Chronic combined systolic and diastolic CHF last EF measured in February 2020 was 40 to 45%.  Presently on Lasix and spironolactone. 4. Hypothyroidism on Synthroid. 5. History of COPD and sleep apnea and interstitial lung disease.  Patient shortness of breath likely from ascites. 6. Pancytopenia likely from cirrhosis of liver.  Appears to be at baseline.  Follow CBC.   DVT prophylaxis: SCDs. Code Status: Full code confirmed with patient's daughter. Family Communication: Patient's daughter. Disposition Plan: Home. Consults called: None. Admission status: Observation.   Rise Patience MD [facility] Pager [phone].  If 7PM-7AM, please contact night-coverage [email or web address] Password [facility]  04/17/2019, 9:55 PM

## 2019-04-17 NOTE — ED Notes (Signed)
Pt ambulated, with one person assistance due to poor vision, steady gait, oxygen 96-99%.

## 2019-04-17 NOTE — ED Provider Notes (Signed)
7:36 PM patient seen in conjunction with [place] PA-C. Patient with increasing shortness of breath in setting of history of worsening ascites and congestive heart failure.  Patient is very symptomatic from worsening ascites and likely congestive heart failure exacerbation.  Crackles in the lungs on exam.  Discussed patient with Dr. Hal Hope who will see in the emergency department.  BP (!) 114/43 (BP Location: Right Arm) Comment: taked twice in booth arms  Pulse (!) 57   Temp 98.7 F (37.1 C) (Oral)   Resp 16   Ht 5\' 1"  (1.549 m)   Wt 82.6 kg   SpO2 98%   BMI 34.39 kg/m     Carlisle Cater, PA-C 04/17/19 1939    Isla Pence, MD 04/17/19 2328

## 2019-04-17 NOTE — ED Notes (Addendum)
ED TO INPATIENT HANDOFF REPORT  ED Nurse Name and Phone #: Marcelina Morel [phone]  S Name/Age/Gender [name] 85 y.o. female Room/Bed: 043C/043C  Code Status   Code Status: Prior  Home/SNF/Other Home Patient oriented to: self, place, time and situation Is this baseline? Yes      Chief Complaint Weight Gain; Abd Swelling  Triage Note .Pt to ED with c/o abd swelling, fatigue and 10 lb weight gain in 2 days.  Daughter st's pt has hx of ascites    Allergies Allergies  Allergen Reactions  . Ace Inhibitors Cough  . Benadryl [Diphenhydramine Hcl (Sleep)] Other (See Comments)    hypotension  . Fexofenadine Hcl Other (See Comments)  . Penicillins Hives and Swelling    Swelling of arms Has patient had a PCN reaction causing immediate rash, facial/tongue/throat swelling, SOB or lightheadedness with hypotension: unknown Has patient had a PCN reaction causing severe rash involving mucus membranes or skin necrosis: no Has patient had a PCN reaction that required hospitalization: unknown Has patient had a PCN reaction occurring within the last 10 years: no, childhood allergy If all of the above answers are "NO", then may proceed with Cephalosporin use.   . Sulfonamide Derivatives Swelling    Level of Care/Admitting Diagnosis ED Disposition    ED Disposition Condition Comment   Admit  Hospital Area: [facility] [[id number]]  Level of Care: Telemetry Medical [104]  I expect the patient will be discharged within 24 hours: No (not a candidate for 5C-Observation unit)  Covid Evaluation: Asymptomatic Screening Protocol (No Symptoms)  Diagnosis: Decompensated hepatic cirrhosis (HCC) [8850277]  Admitting Physician: Eduard Clos [phone]  Attending Physician: Eduard Clos Florian.Pax  PT Class (Do Not Modify): Observation [104]  PT Acc Code (Do Not Modify): Observation [10022]       B Medical/Surgery History Past Medical History:  Diagnosis Date  .  Cirrhosis, non-alcoholic (HCC)   . Coronary artery disease    status post inferior wall myocardial infarction  . Diabetes mellitus   . Dyslipidemia   . Hypotension 08/02/2010   recent episodes of orthostatic hypotension  . Hypothyroidism   . Leg edema   . Myocardial infarct, old   . Neuropathy of hand    left hand numbness/neuropathy  . Obesity   . Sleep apnea    Past Surgical History:  Procedure Laterality Date  . ABDOMINAL HYSTERECTOMY    . CARDIAC CATHETERIZATION     The ejection fraction is around 50%  . CHOLECYSTECTOMY    . CORONARY STENT PLACEMENT    . ESOPHAGOGASTRODUODENOSCOPY N/A 12/12/2012   Procedure: ESOPHAGOGASTRODUODENOSCOPY (EGD);  Surgeon: Florencia Reasons, MD;  Location: [facility];  Service: Endoscopy;  Laterality: N/A;  . FLEXIBLE SIGMOIDOSCOPY N/A 12/12/2012   Procedure: FLEXIBLE SIGMOIDOSCOPY;  Surgeon: Florencia Reasons, MD;  Location: [facility];  Service: Endoscopy;  Laterality: N/A;  . IR PARACENTESIS  09/29/2018  . IR PARACENTESIS  10/01/2018  . TUBAL LIGATION    . WRIST SURGERY       A IV Location/Drains/Wounds Patient Lines/Drains/Airways Status   Active Line/Drains/Airways    Name:   Placement date:   Placement time:   Site:   Days:   Peripheral IV 04/17/19 Right Wrist   04/17/19    1958    Wrist   less than 1          Intake/Output Last 24 hours No intake or output data in the 24 hours ending 04/17/19 2111  Labs/Imaging Results for  orders placed or performed during the hospital encounter of 04/17/19 (from the past 48 hour(s))  CBC with Differential     Status: Abnormal   Collection Time: 04/17/19  4:24 PM  Result Value Ref Range   WBC 3.9 (L) 4.0 - 10.5 K/uL   RBC 4.49 3.87 - 5.11 MIL/uL   Hemoglobin 13.1 12.0 - 15.0 g/dL   HCT 91.440.7 78.236.0 - 95.646.0 %   MCV 90.6 80.0 - 100.0 fL   MCH 29.2 26.0 - 34.0 pg   MCHC 32.2 30.0 - 36.0 g/dL   RDW 21.315.6 (H) 08.611.5 - 57.815.5 %   Platelets 78 (L) 150 - 400 K/uL    Comment: REPEATED TO VERIFY PLATELET  COUNT CONFIRMED BY SMEAR SPECIMEN CHECKED FOR CLOTS Immature Platelet Fraction may be clinically indicated, consider ordering this additional test [id number]    nRBC 0.0 0.0 - 0.2 %   Neutrophils Relative % 61 %   Neutro Abs 2.4 1.7 - 7.7 K/uL   Lymphocytes Relative 15 %   Lymphs Abs 0.6 (L) 0.7 - 4.0 K/uL   Monocytes Relative 18 %   Monocytes Absolute 0.7 0.1 - 1.0 K/uL   Eosinophils Relative 4 %   Eosinophils Absolute 0.2 0.0 - 0.5 K/uL   Basophils Relative 1 %   Basophils Absolute 0.0 0.0 - 0.1 K/uL   Immature Granulocytes 1 %   Abs Immature Granulocytes 0.02 0.00 - 0.07 K/uL    Comment: Performed at [facility], [address] [place], [place] [address]  Comprehensive metabolic panel     Status: Abnormal   Collection Time: 04/17/19  4:24 PM  Result Value Ref Range   Sodium 140 135 - 145 mmol/L   Potassium 3.1 (L) 3.5 - 5.1 mmol/L   Chloride 103 98 - 111 mmol/L   CO2 29 22 - 32 mmol/L   Glucose, Bld 131 (H) 70 - 99 mg/dL   BUN <5 (L) 8 - 23 mg/dL   Creatinine, Ser 4.400.82 0.44 - 1.00 mg/dL   Calcium 8.2 (L) 8.9 - 10.3 mg/dL   Total Protein 6.2 (L) 6.5 - 8.1 g/dL   Albumin 2.4 (L) 3.5 - 5.0 g/dL   AST 30 15 - 41 U/L   ALT 16 0 - 44 U/L   Alkaline Phosphatase 165 (H) 38 - 126 U/L   Total Bilirubin 1.5 (H) 0.3 - 1.2 mg/dL   GFR calc non Af Amer >60 >60 mL/min   GFR calc Af Amer >60 >60 mL/min   Anion gap 8 5 - 15    Comment: Performed at [facility], [address] [place], [place] [address]  Brain natriuretic peptide     Status: Abnormal   Collection Time: 04/17/19  4:24 PM  Result Value Ref Range   B Natriuretic Peptide 150.2 (H) 0.0 - 100.0 pg/mL    Comment: Performed at [facility], [address] [place], [place] [address]  Troponin I (High Sensitivity)     Status: None   Collection Time: 04/17/19  7:18 PM  Result Value Ref Range   Troponin I (High Sensitivity) 9 <18 ng/L    Comment: (NOTE) Elevated high sensitivity troponin I (hsTnI) values  and significant  changes across serial measurements may suggest ACS but many other  chronic and acute conditions are known to elevate hsTnI results.  Refer to the "Links" section for chest pain algorithms and additional  guidance. Performed at [facility], [address] [place],  [place] [address]    Dg Chest Portable 1 View  Result Date: 04/17/2019 CLINICAL DATA:  Swelling EXAM: PORTABLE CHEST 1 VIEW COMPARISON:  09/25/2018 FINDINGS: Cardiomegaly. No confluent airspace opacities, effusions or edema. No acute bony abnormality. IMPRESSION: Cardiomegaly.  No active disease. Electronically Signed   By: Rolm Baptise M.D.   On: 04/17/2019 18:33    Pending Labs Unresulted Labs (From admission, onward)    Start     Ordered   04/17/19 1905  Urinalysis, Routine w reflex microscopic  Once,   STAT     04/17/19 1911   04/17/19 1903  SARS Coronavirus 2 [facility] order, Performed in [facility] hospital lab) Nasopharyngeal Nasopharyngeal Swab  (Symptomatic/High Risk of Exposure/Tier 1 Patients Labs with Precautions)  Once,   STAT    Question Answer Comment  Is this test for diagnosis or screening Screening   Symptomatic for COVID-19 as defined by CDC No   Hospitalized for COVID-19 No   Admitted to ICU for COVID-19 No   Previously tested for COVID-19 No   Resident in a congregate (group) care setting No   Employed in healthcare setting No   Pregnant No      04/17/19 1911          Vitals/Pain Today's Vitals   04/17/19 1845 04/17/19 1900 04/17/19 2015 04/17/19 2030  BP: (!) 123/56 (!) 113/99 (!) 117/56 (!) 121/58  Pulse: (!) 57 63 64 64  Resp:   15 12  Temp:      TempSrc:      SpO2: 98% 97% 96% 96%  Weight:      Height:      PainSc:        Isolation Precautions No active isolations  Medications Medications  furosemide (LASIX) injection 40 mg (40 mg Intravenous Given 04/17/19 2001)  potassium chloride 20 MEQ/15ML (10%) solution 40 mEq (40 mEq Oral Given 04/17/19 1959)     Mobility Walks with assistance due to impaired vision Low fall risk   Focused Assessments NA   R Recommendations: See Admitting Provider Note  Report given to:   Additional Notes:

## 2019-04-17 NOTE — ED Triage Notes (Signed)
.  Pt to ED with c/o abd swelling, fatigue and 10 lb weight gain in 2 days.  Daughter st's pt has hx of ascites

## 2019-04-17 NOTE — Patient Outreach (Signed)
[facility]) Care Management  04/17/2019  [name] 1933-10-27 [id number]   Second attempt to contact patient to ensure receipt of Advance Directive packet and medical alert resources that were mailed for a second time on 04/01/19.  No answer or option to leave message at home number.  Left message on mobile number.  Will make final attempt to reach within four business days.  Ronn Melena, BSW Social Worker [phone]

## 2019-04-18 ENCOUNTER — Other Ambulatory Visit: Payer: Self-pay

## 2019-04-18 ENCOUNTER — Observation Stay (HOSPITAL_COMMUNITY): Payer: HMO

## 2019-04-18 DIAGNOSIS — D696 Thrombocytopenia, unspecified: Secondary | ICD-10-CM

## 2019-04-18 DIAGNOSIS — E039 Hypothyroidism, unspecified: Secondary | ICD-10-CM | POA: Diagnosis not present

## 2019-04-18 DIAGNOSIS — R188 Other ascites: Secondary | ICD-10-CM | POA: Diagnosis not present

## 2019-04-18 DIAGNOSIS — K7460 Unspecified cirrhosis of liver: Secondary | ICD-10-CM | POA: Diagnosis not present

## 2019-04-18 DIAGNOSIS — D72819 Decreased white blood cell count, unspecified: Secondary | ICD-10-CM

## 2019-04-18 DIAGNOSIS — I5042 Chronic combined systolic (congestive) and diastolic (congestive) heart failure: Secondary | ICD-10-CM

## 2019-04-18 DIAGNOSIS — D7589 Other specified diseases of blood and blood-forming organs: Secondary | ICD-10-CM

## 2019-04-18 LAB — BODY FLUID CELL COUNT WITH DIFFERENTIAL
Lymphs, Fluid: 50 %
Monocyte-Macrophage-Serous Fluid: 43 % — ABNORMAL LOW (ref 50–90)
Neutrophil Count, Fluid: 7 % (ref 0–25)
Total Nucleated Cell Count, Fluid: 104 cu mm (ref 0–1000)

## 2019-04-18 LAB — URINALYSIS, ROUTINE W REFLEX MICROSCOPIC
Bilirubin Urine: NEGATIVE
Glucose, UA: NEGATIVE mg/dL
Ketones, ur: NEGATIVE mg/dL
Leukocytes,Ua: NEGATIVE
Nitrite: NEGATIVE
Protein, ur: NEGATIVE mg/dL
Specific Gravity, Urine: 1.005 (ref 1.005–1.030)
pH: 7 (ref 5.0–8.0)

## 2019-04-18 LAB — HEPATIC FUNCTION PANEL
ALT: 14 U/L (ref 0–44)
AST: 28 U/L (ref 15–41)
Albumin: 2 g/dL — ABNORMAL LOW (ref 3.5–5.0)
Alkaline Phosphatase: 139 U/L — ABNORMAL HIGH (ref 38–126)
Bilirubin, Direct: 0.4 mg/dL — ABNORMAL HIGH (ref 0.0–0.2)
Indirect Bilirubin: 1.3 mg/dL — ABNORMAL HIGH (ref 0.3–0.9)
Total Bilirubin: 1.7 mg/dL — ABNORMAL HIGH (ref 0.3–1.2)
Total Protein: 5.8 g/dL — ABNORMAL LOW (ref 6.5–8.1)

## 2019-04-18 LAB — BASIC METABOLIC PANEL
Anion gap: 7 (ref 5–15)
BUN: 5 mg/dL — ABNORMAL LOW (ref 8–23)
CO2: 28 mmol/L (ref 22–32)
Calcium: 7.9 mg/dL — ABNORMAL LOW (ref 8.9–10.3)
Chloride: 105 mmol/L (ref 98–111)
Creatinine, Ser: 0.75 mg/dL (ref 0.44–1.00)
GFR calc Af Amer: >60 mL/min (ref >60)
GFR calc non Af Amer: >60 mL/min (ref >60)
Glucose, Bld: 129 mg/dL — ABNORMAL HIGH (ref 70–99)
Potassium: 3.1 mmol/L — ABNORMAL LOW (ref 3.5–5.1)
Sodium: 140 mmol/L (ref 135–145)

## 2019-04-18 LAB — PROTEIN, PLEURAL OR PERITONEAL FLUID: Total protein, fluid: <3 g/dL

## 2019-04-18 LAB — CBC
HCT: 36.7 % (ref 36.0–46.0)
Hemoglobin: 12.2 g/dL (ref 12.0–15.0)
MCH: 29.5 pg (ref 26.0–34.0)
MCHC: 33.2 g/dL (ref 30.0–36.0)
MCV: 88.9 fL (ref 80.0–100.0)
Platelets: 57 10*3/uL — ABNORMAL LOW (ref 150–400)
RBC: 4.13 MIL/uL (ref 3.87–5.11)
RDW: 15.7 % — ABNORMAL HIGH (ref 11.5–15.5)
WBC: 2.7 10*3/uL — ABNORMAL LOW (ref 4.0–10.5)
nRBC: 0 % (ref 0.0–0.2)

## 2019-04-18 LAB — ALBUMIN, PLEURAL OR PERITONEAL FLUID: Albumin, Fluid: <1 g/dL

## 2019-04-18 LAB — GLUCOSE, CAPILLARY
Glucose-Capillary: 113 mg/dL — ABNORMAL HIGH (ref 70–99)
Glucose-Capillary: 156 mg/dL — ABNORMAL HIGH (ref 70–99)

## 2019-04-18 LAB — MAGNESIUM: Magnesium: 1.6 mg/dL — ABNORMAL LOW (ref 1.7–2.4)

## 2019-04-18 LAB — TSH: TSH: 2.283 u[IU]/mL (ref 0.350–4.500)

## 2019-04-18 MED ORDER — ALBUMIN HUMAN 25 % IV SOLN
12.5000 g | Freq: Four times a day (QID) | INTRAVENOUS | Status: DC
  Administered 2019-04-18 (×2): 12.5 g via INTRAVENOUS
  Filled 2019-04-18 (×4): qty 50

## 2019-04-18 MED ORDER — LIDOCAINE HCL (PF) 1 % IJ SOLN
INTRAMUSCULAR | Status: AC
  Filled 2019-04-18: qty 30

## 2019-04-18 MED ORDER — SPIRONOLACTONE 100 MG PO TABS: 100.0000 mg | ORAL_TABLET | Freq: Every day | ORAL | 1 refills | Status: DC

## 2019-04-18 MED ORDER — FUROSEMIDE 40 MG PO TABS: ORAL_TABLET | ORAL | 3 refills | Status: DC

## 2019-04-18 MED ORDER — SPIRONOLACTONE 25 MG PO TABS
100.0000 mg | ORAL_TABLET | Freq: Every day | ORAL | Status: DC
  Administered 2019-04-18: 100 mg via ORAL
  Filled 2019-04-18: qty 4

## 2019-04-18 MED ORDER — POTASSIUM CHLORIDE CRYS ER 20 MEQ PO TBCR
40.0000 meq | EXTENDED_RELEASE_TABLET | ORAL | Status: AC
  Administered 2019-04-18 (×2): 40 meq via ORAL
  Filled 2019-04-18 (×2): qty 2

## 2019-04-18 MED ORDER — MAGNESIUM SULFATE 2 GM/50ML IV SOLN
2.0000 g | Freq: Once | INTRAVENOUS | Status: AC
  Administered 2019-04-18: 2 g via INTRAVENOUS
  Filled 2019-04-18: qty 50

## 2019-04-18 NOTE — Progress Notes (Signed)
Discharges instructions given to patient and her daughter at bedside. All questions answered and concerns addressed. IV is taken out and patient is dressed.

## 2019-04-18 NOTE — Discharge Summary (Signed)
Physician Discharge Summary  [name] KGY:[id number] DOB: 04/24/1934 DOA: 04/17/2019  PCP: Mellody Dance, DO  Admit date: 04/17/2019 Discharge date: 04/18/2019  Admitted From: Home Disposition: Home  Recommendations for Outpatient Follow-up:  1. Follow up with PCP in 1-2 weeks 2. Please obtain CBC/BMP/Mag at follow up 3. Please follow up on the following pending results: Peritoneal fluid culture  Home Health: None Equipment/Devices: None  Discharge Condition: Stable CODE STATUS: Full code  HPI: Per Dr. Hal Hope "[name] is a 85 y.o. female with history of cirrhosis of liver secondary to [place], chronic combined CHF, diabetes mellitus, pancytopenia, COPD, sleep apnea was brought to the ER after patient's family found that patient has been having increasing distention of the abdomen.  Per patient's daughter provided the history patient has gained at least 10 pounds over the last 3 weeks.  At times have shortness of breath.  Has been compliant with her Lasix spironolactone and diet.  Last week patient had a brief episode of nausea vomiting and diarrhea which was self-limited.  Has not had any fever chills.  ED Course: In the ER on exam patient has distended abdomen.  Chest x-ray unremarkable.  COVID-19 test was negative.  BNP 150.2.  Labs are largely at baseline.  Patient was given Lasix 40 mg IV and admitted for decompensated cirrhosis of liver with ascites."  Hospital Course: Patient with history as above admitted for abdominal distention and weight gain likely due to ascites related to her [name] cirrhosis.  She had no abdominal tenderness, fever or leukocytosis to suggest SBP.  She was diuresed with Lasix and spironolactone.    The next morning, she had paracentesis with removal of 3 L.  Fluid studies did not suggest SBP.  SAAG suggests portal hypertension.  Abdominal distention improved.  She felt well and ready to go home.  Discharged on Lasix 40 mg 1-2 times a day  and spironolactone 100 mg daily.  Encouraged on salt and fluid restriction.  Patient's daughter updated on treatment course and plan.  Peritoneal fluid culture pending.  See individual problem list below for more.  Discharge Diagnoses:  Abdominal distention/ascites in patient with night cirrhosis:  -Paracentesis on 04/18/2019 with removal of 3 L -No tenderness, fever or leukocytosis to suggest SBP. Peritoneal fluid study not suggestive for SBP. -Peritoneal fluid culture pending. -Discharged on p.o. Lasix 40 mg 1-2 times a day, and p.o. Aldactone 100 mg daily. -Check BMP and magnesium at follow-up in 1 week. -May consider SBP prophylaxis down the road -Consider GI referral if she has not been connected.  Hypokalemia/hypomagnesemia: Likely due to diuretics. -Replenished prior to discharge. -Recheck electrolytes at follow-up in 1 week.  Bicytopenia: With leukopenia and thrombocytopenia.  Likely due to liver cirrhosis.  No signs of acute bleed. -Check CBC at follow-up.  Chronic combined systolic and diastolic CHF: Echo in 0/26 with EF of 40 to 45%.  No cardiopulmonary symptoms. -Diuretics as above. -Continue home medications. -Check electrolytes at follow-up in 1 week.  IDDM-2 -Discharged on home medications.  Other chronic medical conditions including hypothyroidism, COPD and OSA stable. -We will continue home medication and care.   Discharge Instructions  Discharge Instructions    (HEART FAILURE PATIENTS) Call MD:  Anytime you have any of the following symptoms: 1) 3 pound weight gain in 24 hours or 5 pounds in 1 week 2) shortness of breath, with or without a dry hacking cough 3) swelling in the hands, feet or stomach 4) if you have to  sleep on extra pillows at night in order to breathe.   Complete by: As directed    Call MD for:  difficulty breathing, headache or visual disturbances   Complete by: As directed    Call MD for:  persistant dizziness or light-headedness   Complete  by: As directed    Call MD for:  persistant nausea and vomiting   Complete by: As directed    Diet - low sodium heart healthy   Complete by: As directed    Diet Carb Modified   Complete by: As directed    Discharge instructions   Complete by: As directed    It has been a pleasure taking care of you! You were admitted with abdominal distention and weight gain likely due to fluid buildup from your liver disease.  We have removed about 3 L of fluid off your belly.  We have also treated you with fluid medication.  At this point, we think it is safe to let you go home and continue fluid medication as prescribed.  We have made adjustments to fluid medications.  Please review your new medication list and the directions before you take your medications. We strongly recommend limiting your fluid and sodium (salt) intake to less than 1500 cc (6 cups) and 2000 mg (2 g) a day respectively.  Weight yourself daily.  You may take additional dose of Lasix (furosemide) if you notice abdominal distention or weight gain greater than 2 pounds. Please call your primary care office as soon as possible to schedule hospital follow-up visit in 1 to 2 weeks.  Take care,   Increase activity slowly   Complete by: As directed      Allergies as of 04/18/2019      Reactions   Ace Inhibitors Cough   Benadryl [diphenhydramine Hcl (sleep)] Other (See Comments)   hypotension   Fexofenadine Hcl Other (See Comments)   Penicillins Hives, Swelling   Swelling of arms Has patient had a PCN reaction causing immediate rash, facial/tongue/throat swelling, SOB or lightheadedness with hypotension: unknown Has patient had a PCN reaction causing severe rash involving mucus membranes or skin necrosis: no Has patient had a PCN reaction that required hospitalization: unknown Has patient had a PCN reaction occurring within the last 10 years: no, childhood allergy If all of the above answers are "NO", then may proceed with Cephalosporin  use.   Sulfonamide Derivatives Swelling      Medication List    TAKE these medications   albuterol 108 (90 Base) MCG/ACT inhaler Commonly known as: ProAir HFA Inhale 2 puffs into the lungs every 6 (six) hours as needed for wheezing or shortness of breath.   brimonidine 0.2 % ophthalmic solution Commonly known as: ALPHAGAN Place 1 drop into both eyes daily.   Easy Touch Pen Needles 31G X 5 MM Misc Generic drug: Insulin Pen Needle USE TO INJECT INSULIN 5 TIMES A DAY   escitalopram 20 MG tablet Commonly known as: LEXAPRO TAKE 1 TABLET (20 MG TOTAL) BY MOUTH DAILY.   FreeStyle Lite Devi Use to check blood sugars 2-3 times daily   furosemide 40 MG tablet Commonly known as: LASIX Take 1 to 2 tablets daily as directed. What changed:   how much to take  how to take this  when to take this  additional instructions   GenTeal Tears Night-Time Oint Apply 1 application to eye at bedtime.   insulin aspart 100 UNIT/ML FlexPen Commonly known as: NovoLOG FlexPen Inject 10-40 units into ski  3 times daily with meals. Adjust as needed.   isosorbide mononitrate 30 MG 24 hr tablet Commonly known as: IMDUR Take 1 tablet (30 mg total) by mouth daily.   Lantus SoloStar 100 UNIT/ML Solostar Pen Generic drug: Insulin Glargine Inject 20 Units into the skin 2 (two) times daily. Patient uses sliding scale for Lantus 20-60 units against medical advice   levothyroxine 100 MCG tablet Commonly known as: SYNTHROID TAKE 1 TABLET BY MOUTH DAILY   loratadine 10 MG tablet Commonly known as: CLARITIN Take 1 tablet (10 mg total) by mouth daily. What changed:   when to take this  reasons to take this   metoprolol tartrate 25 MG tablet Commonly known as: LOPRESSOR Take 0.5 tablets (12.5 mg total) by mouth 2 (two) times daily.   montelukast 10 MG tablet Commonly known as: SINGULAIR TAKE 1 TABLET BY MOUTH EVERY EVENING.   nitroGLYCERIN 0.4 MG SL tablet Commonly known as:  NITROSTAT Place 1 tab under the tongue every 5 minutes as needed for chest pain.   OneTouch Delica Plus [id number] Misc USE TO CHECK BLOOD SUGAR THREE TIMES A DAY   OneTouch Ultra test strip Generic drug: glucose blood TEST BLOOD SUGAR 2-3 TIMES A DAY   pantoprazole 20 MG tablet Commonly known as: PROTONIX Take 1 tablet (20 mg total) by mouth 2 (two) times daily.   Refresh Optive Mega-3 0.5-1-0.5 % Soln Generic drug: Carboxymeth-Glyc-Polysorb PF Apply 1-2 drops to eye 2 (two) times daily.   Restasis 0.05 % ophthalmic emulsion Generic drug: cycloSPORINE Place 1 drop into both eyes 2 (two) times daily. 1 drop both eyes twice daily   spironolactone 100 MG tablet Commonly known as: ALDACTONE Take 1 tablet (100 mg total) by mouth daily. Start taking on: April 19, 2019 What changed:   medication strength  how much to take   Systane Balance 0.6 % Soln Generic drug: Propylene Glycol Apply 1 drop to eye 2 (two) times daily.   Vision Formula/Lutein Tabs Take 1 tablet by mouth daily.   Vitamin D3 50 MCG (2000 UT) Tabs Take 1 tablet by mouth daily.      Follow-up Information    Mellody Dance, DO. Schedule an appointment as soon as possible for a visit in 1 week(s).   Specialty: Family Medicine Contact information: [address] [place] [address] [phone]           Consultations:  None  Procedures/Studies:  2D Echo: None  US paracentesis on 8/29 with removal of 3 L.  US Paracentesis  Result Date: 04/18/2019 INDICATION: Abdominal distention. Ascites. Request for diagnostic and therapeutic paracentesis. EXAM: ULTRASOUND GUIDED LEFT LOWER QUADRANT PARACENTESIS MEDICATIONS: None. COMPLICATIONS: None immediate. PROCEDURE: Informed written consent was obtained from the patient after a discussion of the risks, benefits and alternatives to treatment. A timeout was performed prior to the initiation of the procedure. Initial ultrasound scanning  demonstrates a large amount of ascites within the left lower abdominal quadrant. The left lower abdomen was prepped and draped in the usual sterile fashion. 1% lidocaine with epinephrine was used for local anesthesia. Following this, a 19 gauge, 10-cm, Yueh catheter was introduced. An ultrasound image was saved for documentation purposes. The paracentesis was performed. The catheter was removed and a dressing was applied. The patient tolerated the procedure well without immediate post procedural complication. FINDINGS: A total of approximately 3 L of clear yellow fluid was removed. Samples were sent to the laboratory as requested by the clinical team. IMPRESSION: Successful ultrasound-guided paracentesis  yielding 3 liters of peritoneal fluid. Read by: Ascencion Dike PA-C Electronically Signed   By: Jacqulynn Cadet M.D.   On: 04/18/2019 10:36   Dg Chest Portable 1 View  Result Date: 04/17/2019 CLINICAL DATA:  Swelling EXAM: PORTABLE CHEST 1 VIEW COMPARISON:  09/25/2018 FINDINGS: Cardiomegaly. No confluent airspace opacities, effusions or edema. No acute bony abnormality. IMPRESSION: Cardiomegaly.  No active disease. Electronically Signed   By: Rolm Baptise M.D.   On: 04/17/2019 18:33      Subjective: No major events overnight of this morning.  Reports improvement in abdominal distention.  She denies chest pain, dyspnea, nausea, vomiting or abdominal pain.  She denies melena or hematochezia.  Feels well and ready to go home.  Discussed with patient's daughter over the phone.  All questions answered.  Daughter expressed appreciation.   Discharge Exam: Vitals:   04/18/19 0939 04/18/19 0955  BP: (!) 119/55 125/71  Pulse:    Resp:    Temp:    SpO2:      GENERAL: No acute distress.  Appears well.  HEENT: MMM.  Vision and hearing grossly intact.  NECK: Supple.  No JVD.  LUNGS:  No IWOB. Good air movement bilaterally. HEART:  RRR. Heart sounds normal.  ABD: Bowel sounds present. Soft. Non tender.   MSK/EXT:  Moves all extremities. No apparent deformity. No edema bilaterally. SKIN: no apparent skin lesion or wound NEURO: Awake, alert and oriented appropriately.  No gross deficit.  PSYCH: Calm. Normal affect.   The results of significant diagnostics from this hospitalization (including imaging, microbiology, ancillary and laboratory) are listed below for reference.     Microbiology: Recent Results (from the past 240 hour(s))  SARS Coronavirus 2 [facility] order, Performed in [facility] hospital lab) Nasopharyngeal Nasopharyngeal Swab     Status: None   Collection Time: 04/17/19  8:04 PM   Specimen: Nasopharyngeal Swab  Result Value Ref Range Status   SARS Coronavirus 2 NEGATIVE NEGATIVE Final    Comment: (NOTE) If result is NEGATIVE SARS-CoV-2 target nucleic acids are NOT DETECTED. The SARS-CoV-2 RNA is generally detectable in upper and lower  respiratory specimens during the acute phase of infection. The lowest  concentration of SARS-CoV-2 viral copies this assay can detect is 250  copies / mL. A negative result does not preclude SARS-CoV-2 infection  and should not be used as the sole basis for treatment or other  patient management decisions.  A negative result may occur with  improper specimen collection / handling, submission of specimen other  than nasopharyngeal swab, presence of viral mutation(s) within the  areas targeted by this assay, and inadequate number of viral copies  (<250 copies / mL). A negative result must be combined with clinical  observations, patient history, and epidemiological information. If result is POSITIVE SARS-CoV-2 target nucleic acids are DETECTED. The SARS-CoV-2 RNA is generally detectable in upper and lower  respiratory specimens dur ing the acute phase of infection.  Positive  results are indicative of active infection with SARS-CoV-2.  Clinical  correlation with patient history and other diagnostic information is  necessary to determine  patient infection status.  Positive results do  not rule out bacterial infection or co-infection with other viruses. If result is PRESUMPTIVE POSTIVE SARS-CoV-2 nucleic acids MAY BE PRESENT.   A presumptive positive result was obtained on the submitted specimen  and confirmed on repeat testing.  While 2019 novel coronavirus  (SARS-CoV-2) nucleic acids may be present in the submitted sample  additional confirmatory testing  may be necessary for epidemiological  and / or clinical management purposes  to differentiate between  SARS-CoV-2 and other Sarbecovirus currently known to infect humans.  If clinically indicated additional testing with an alternate test  methodology [phone]) is advised. The SARS-CoV-2 RNA is generally  detectable in upper and lower respiratory sp ecimens during the acute  phase of infection. The expected result is Negative. Fact Sheet for Patients:  StrictlyIdeas.no Fact Sheet for Healthcare Providers: BankingDealers.co.za This test is not yet approved or cleared by the [place] FDA and has been authorized for detection and/or diagnosis of SARS-CoV-2 by FDA under an Emergency Use Authorization (EUA).  This EUA will remain in effect (meaning this test can be used) for the duration of the COVID-19 declaration under Section 564(b)(1) of the Act, 21 U.S.C. section 360bbb-3(b)(1), unless the authorization is terminated or revoked sooner. Performed at [facility], [place] [address] [place], [place] [address]      Labs: BNP (last 3 results) Recent Labs    09/25/18 1253 09/26/18 1518 04/17/19 1624  BNP 228.6* 230.4* 993.5*   Basic Metabolic Panel: Recent Labs  Lab 04/17/19 1624 04/18/19 0403  NA 140 140  K 3.1* 3.1*  CL 103 105  CO2 29 28  GLUCOSE 131* 129*  BUN <5* 5*  CREATININE 0.82 0.75  CALCIUM 8.2* 7.9*  MG  --  1.6*   Liver Function Tests: Recent Labs  Lab 04/17/19 1624 04/18/19 0403   AST 30 28  ALT 16 14  ALKPHOS 165* 139*  BILITOT 1.5* 1.7*  PROT 6.2* 5.8*  ALBUMIN 2.4* 2.0*   No results for input(s): LIPASE, AMYLASE in the last 168 hours. No results for input(s): AMMONIA in the last 168 hours. CBC: Recent Labs  Lab 04/17/19 1624 04/18/19 0403  WBC 3.9* 2.7*  NEUTROABS 2.4  --   HGB 13.1 12.2  HCT 40.7 36.7  MCV 90.6 88.9  PLT 78* 57*   Cardiac Enzymes: No results for input(s): CKTOTAL, CKMB, CKMBINDEX, TROPONINI in the last 168 hours. BNP: Invalid input(s): POCBNP CBG: Recent Labs  Lab 04/18/19 0542  GLUCAP 113*   D-Dimer No results for input(s): DDIMER in the last 72 hours. Hgb A1c No results for input(s): HGBA1C in the last 72 hours. Lipid Profile No results for input(s): CHOL, HDL, LDLCALC, TRIG, CHOLHDL, LDLDIRECT in the last 72 hours. Thyroid function studies Recent Labs    04/18/19 0403  TSH 2.283   Anemia work up No results for input(s): VITAMINB12, FOLATE, FERRITIN, TIBC, IRON, RETICCTPCT in the last 72 hours. Urinalysis    Component Value Date/Time   COLORURINE YELLOW 04/17/2019 0051   APPEARANCEUR CLEAR 04/17/2019 0051   LABSPEC 1.005 04/17/2019 0051   PHURINE 7.0 04/17/2019 0051   GLUCOSEU NEGATIVE 04/17/2019 0051   HGBUR SMALL (A) 04/17/2019 0051   BILIRUBINUR NEGATIVE 04/17/2019 0051   KETONESUR NEGATIVE 04/17/2019 0051   PROTEINUR NEGATIVE 04/17/2019 0051   UROBILINOGEN 0.2 12/12/2012 0920   NITRITE NEGATIVE 04/17/2019 0051   LEUKOCYTESUR NEGATIVE 04/17/2019 0051   Sepsis Labs Invalid input(s): PROCALCITONIN,  WBC,  LACTICIDVEN   Time coordinating discharge: 35 minutes  SIGNED:  Mercy Riding, MD  [facility] 04/18/2019, 11:06 AM  If 7PM-7AM, please contact night-coverage [email or web address] Password [email or web address]

## 2019-04-19 LAB — GRAM STAIN

## 2019-04-20 ENCOUNTER — Other Ambulatory Visit: Payer: Self-pay

## 2019-04-20 ENCOUNTER — Ambulatory Visit: Payer: Self-pay

## 2019-04-20 DIAGNOSIS — J449 Chronic obstructive pulmonary disease, unspecified: Secondary | ICD-10-CM | POA: Diagnosis not present

## 2019-04-20 NOTE — Patient Outreach (Signed)
[facility]) Care Management  04/20/2019  Temari Schooler [facility] 06-May-1934 [id number]   Third attempt to contact patient to ensure receipt of Advance Directive packet and medical alert resources that were mailed for a second time on 04/01/19. No answer or option to leave message at home number. Left message on mobile number. Closing case at this time but will assist patient further if return call is received.    Ronn Melena, BSW Social Worker [phone]

## 2019-04-23 LAB — CULTURE, BODY FLUID W GRAM STAIN -BOTTLE: Culture: NO GROWTH

## 2019-04-30 DIAGNOSIS — G4733 Obstructive sleep apnea (adult) (pediatric): Secondary | ICD-10-CM | POA: Diagnosis not present

## 2019-05-05 ENCOUNTER — Other Ambulatory Visit: Payer: Self-pay

## 2019-05-05 ENCOUNTER — Ambulatory Visit (INDEPENDENT_AMBULATORY_CARE_PROVIDER_SITE_OTHER): Payer: HMO | Admitting: Family Medicine

## 2019-05-05 ENCOUNTER — Telehealth: Payer: Self-pay

## 2019-05-05 ENCOUNTER — Encounter: Payer: Self-pay | Admitting: Family Medicine

## 2019-05-05 ENCOUNTER — Other Ambulatory Visit: Payer: Self-pay | Admitting: Family Medicine

## 2019-05-05 VITALS — BP 101/56 | HR 68 | Temp 97.6°F | Resp 14 | Ht 60.0 in | Wt 160.3 lb

## 2019-05-05 DIAGNOSIS — D61818 Other pancytopenia: Secondary | ICD-10-CM | POA: Diagnosis not present

## 2019-05-05 DIAGNOSIS — Z794 Long term (current) use of insulin: Secondary | ICD-10-CM | POA: Diagnosis not present

## 2019-05-05 DIAGNOSIS — K7460 Unspecified cirrhosis of liver: Secondary | ICD-10-CM | POA: Diagnosis not present

## 2019-05-05 DIAGNOSIS — K766 Portal hypertension: Secondary | ICD-10-CM | POA: Insufficient documentation

## 2019-05-05 DIAGNOSIS — D72819 Decreased white blood cell count, unspecified: Secondary | ICD-10-CM | POA: Diagnosis not present

## 2019-05-05 DIAGNOSIS — E876 Hypokalemia: Secondary | ICD-10-CM

## 2019-05-05 DIAGNOSIS — I10 Essential (primary) hypertension: Secondary | ICD-10-CM | POA: Diagnosis not present

## 2019-05-05 DIAGNOSIS — E118 Type 2 diabetes mellitus with unspecified complications: Secondary | ICD-10-CM

## 2019-05-05 DIAGNOSIS — D696 Thrombocytopenia, unspecified: Secondary | ICD-10-CM | POA: Diagnosis not present

## 2019-05-05 DIAGNOSIS — Z09 Encounter for follow-up examination after completed treatment for conditions other than malignant neoplasm: Secondary | ICD-10-CM

## 2019-05-05 DIAGNOSIS — E1159 Type 2 diabetes mellitus with other circulatory complications: Secondary | ICD-10-CM | POA: Diagnosis not present

## 2019-05-05 DIAGNOSIS — F329 Major depressive disorder, single episode, unspecified: Secondary | ICD-10-CM

## 2019-05-05 DIAGNOSIS — F32A Depression, unspecified: Secondary | ICD-10-CM

## 2019-05-05 DIAGNOSIS — K7581 Nonalcoholic steatohepatitis (NASH): Secondary | ICD-10-CM | POA: Diagnosis not present

## 2019-05-05 DIAGNOSIS — K7290 Hepatic failure, unspecified without coma: Secondary | ICD-10-CM | POA: Diagnosis not present

## 2019-05-05 DIAGNOSIS — I152 Hypertension secondary to endocrine disorders: Secondary | ICD-10-CM

## 2019-05-05 DIAGNOSIS — IMO0001 Reserved for inherently not codable concepts without codable children: Secondary | ICD-10-CM

## 2019-05-05 DIAGNOSIS — R188 Other ascites: Secondary | ICD-10-CM | POA: Insufficient documentation

## 2019-05-05 NOTE — Telephone Encounter (Signed)
Per Dr Raliegh Scarlet: Call patient and tell her that after speaking with her cardiologist, we recommend she hold the Imdur or long-acting nitroglycerin for now due to her lower blood pressures. She is to continue to monitor her blood pressures and pulses at home and follow-up with me closely.  No answer, message left on number [phone] (mobile)

## 2019-05-05 NOTE — Progress Notes (Addendum)
Impression and Recommendations:    1. Hospital discharge follow-up   2. Decompensated hepatic cirrhosis (HCC)   3. Liver cirrhosis secondary to NASH (HCC)   4. Hypertension associated with diabetes (HCC)   5. Insulin dependent diabetes mellitus with complications (HCC)   6. Thrombocytopenia, unspecified (HCC)   7. Pancytopenia (HCC)   8. Hypokalemia due to excessive renal loss of potassium secondary to diuresis   9. Chronic leukopenia     - Regarding pt's recent hospitalization and/or ED visit: reviewed in great detail recent hospitalization notes, clinical lab tests, tests in the radiology section of CPT, tests in the medicine testing of CPT, and obtained history from pt etc.  Moderate to significant complexity.  Also requiring getting in touch with her other specialist -cardiology-  to discuss treatment regiment and modification of medications - Need for lab work today to assess health status after hospital visit.   Hospital Follow-Up - Admitted 04/17/2019, Decompensated Hepatic Cirrhosis, Liver Cirrhosis Secondary to NASH, Ascites of the Liver  -Patient's condition is stable.  She is back to her normal life completing her full ADLs around her home.  She denies any problems with abdominal bloating or weight gain as well as any problems with eating or having bowel movements.  She has been up and mobile and overall her ascites has been well controlled/not reaccumulated  Hypokalemia with increased diuresis:  - however, with increased diuresis due to her recent decompensated hepatic cirrhosis secondary to [place] with ascites, and additionally patient with known congestive heart failure with EF earlier this year of 40+ percent, patient will need monitoring of her potassium.   - Will obtain BMP today.  I assume they replaced her potassium in the hospital, but this will need to monitored closely by the treating physician who is diuresing patient which would be GI -Last office visit when I  saw her she was on Lasix 40 mg twice daily and spironolactone 50 mg daily, this has been increased - I am uncertain how much gastroenterology will want to continue diuresis - thus, management of her diuresis is per gastroenterology currently.   Told patient that these medications will come from them -Patient may need K. Dur of 20 to 40 mEq in future depending on potassium level   GI Follow-Up -Peritoneal fluid drained off patient of over 3 L was negative for infectious process - Due to follow up with Dr. Randa [facility]ensEdwards of [facility] gastroenterology this Friday. -They will obtain liver enzymes and further blood work.  Will hold off on CMP today and just do BMP - Advised patient to have all results from gastroenterology sent to clinic after visit.   Thrombocytopenia, pancytopenia: -Patient had recently seen her hematologist/oncologist Dr. Melvyn NethLewis on 02/13/2019. -Most recent labs in hospital are stable for patient's history -They obtain labs on patient and monitor her on a regular basis.  Most recently they did obtain iron level, CBC, CMP, ferritin and iron binding capacity test.  I cannot see these results as this was [facility]   Hypertension/  CHF/  paroxysms of A. Fib -Patient stable from a cardiac standpoint.  No chest pain.  No runs of A. fib or complaints per patient today. -Blood pressure has been on the low side ever since significant diuresis was done in the hospital recently - Treatment plan managed by cardiology- Nahser - Ultrasound echocardiogram last obtained in February of 2020: Showed mild to moderate reduced systolic function of EF of 40-45%, with no increased  left ventricular wall thickness.  -  Discussed that furosemide as well as spironolactone will contribute to lowered blood pressure.   - Reviewed need to adjust patient's medications today.  - Patient follows up with Dr. Elease Hashimoto. -Compiled message to send to cardiologist today.   - Discussed recent hospital visit  and addressed pt low BP reading in office today, including pt sx of slight dizziness in the mornings when first waking up.   -Per Dr. Harvie Bridge last office visit several months ago, he added Imdur due to patient's chest pain and blood pressure at that time was slightly up.   - I assume this is something he will be okay with modifying first yet we will await his input whether or not to take patient off or go to one half tab. - Will await changing treatment plan until receiving input from cardiology.  - Advised patient to be very careful while getting up in the mornings. - Encouraged patient to sit at the edge of the bed at least 1 minute while transitioning from a lying down position. - Again, patient knows to wait at least 1 minute or more before moving after standing from sitting. - STRONGLY encouraged patient to use walker while moving at home. - Patient agrees to use walker (states does not usually use it).  - Patient will check BP at home and write down log of pulse and BP.  - Advised patient to follow up with Dr. Elease Hashimoto in near future.  - last seen by cardiology in May of 2020.  - Will continue to monitor closely.    Insulin Dependent Diabetes Mellitus - Seen by Endo  - Per pt, fasting blood sugars running high (over 200) after hospitalization. - Discussed that patient obtains management through Dr. Talmage Nap of endocrinology. - However, explained that if prior to eating, patient's sugars are high, need increased basal insulin.  Increase dose to 30 of Lantus twice daily.-This is what her endocrinologist had her on prior to hospitalization  - Patient knows to obtain further direction, adjustment of medications and treatment plan from Dr. Talmage Nap of endocrinology.  And of course if they are unavailable, patient knows she can always call us for temporary direction with management of her blood sugars  - If patient's fasting blood sugars continue to remain over 200, patient knows to call Dr.  Willeen Cass office for further assistance.  - Will continue to monitor closely.    Recommendations - 6 weeks return for reassessment of blood sugars after increase in Lantus, and to evaluate log of blood pressures, since we will wait to hear from cardiology and modify her BP meds more than likely.  - If pt daughter wishes to discuss further, knows to call clinic later.   Orders Placed This Encounter  Procedures  . Basic Metabolic Panel (BMET)  . Magnesium  . CBC w/Diff    Gross side effects, risk and benefits, and alternatives of medications and treatment plan in general discussed with patient.  Patient is aware that all medications have potential side effects and we are unable to predict every side effect or drug-drug interaction that may occur.   Patient will call with any questions prior to using medication if they have concerns.    Expresses verbal understanding and consents to current therapy and treatment regimen.  No barriers to understanding were identified.  Red flag symptoms and signs discussed in detail.  Patient expressed understanding regarding what to do in case of emergency\urgent symptoms  Please see AVS handed  out to patient at the end of our visit for further patient instructions/ counseling done pertaining to today's office visit.   Return Check blood pressures at home and write down pulses and blood pressures, for 6 weeks follow-up doxy/telehealth for increase in Lantus today and follow-up BP.     Note:  This note was prepared with assistance of Dragon voice recognition software. Occasional wrong-word or sound-a-like substitutions may have occurred due to the inherent limitations of voice recognition software.   This document serves as a record of services personally performed by Thomasene Lot, DO. It was created on her behalf by Peggye Fothergill, a trained medical scribe. The creation of this record is based on the scribe's personal observations and the provider's  statements to them.   I have reviewed the above medical documentation for accuracy and completeness and I concur.  Thomasene Lot, DO 05/05/2019 12:34 PM        --------------------------------------------------------------------------------------------------------------------------------------------------------------------------------------------------------------------------------------------    Subjective:     HPI: [name] is a 85 y.o. female who presents to [facility] today for issues as discussed below.   HPI after recent Hospital Visit [name] is a 85 y.o. female. With history of ascites d/t NASH, DM, CHF (2/2 old MI) and prior hospitalization for paracentesis.  Daughter at bedside with patient to assist with history. Symptoms began two days ago and include weakness, weight gain 10 lbs, and abdominal distention. Patient states she pees 2-3 times per night and has been sleeping more and eating less over the past 3 days.  Patient denies chest pain, fever, chills, abdominal pain, diarrhea, nausea, vomiting change in bowel habits, or stool appearance. Daughter states that she has noticed her mother having increased difficulty walking and conducting activities of daily living over the past 2 days. She also states that her mother has been more short of breath and wheezing more over that period.  Per daughter: Dry weight around 173 today patient's daughter states 81; patient lives alone but daughter puts meds together for her each week to ensure she does not miss a dose.  Patient states she sees her cardiologist regularly before COVID-19.  ----------------------------------------  Blood Pressure during Hospitalization: 123/56 with a pulse of 57, then 117/56 pulse of 64, 121/58 pulse of 64.  Patient had three liters of fluid removed while in the hospital.  Patient notes that she thinks she weighed 202 prior and "went down thirty or  forty pounds."  After recent hospitalization, denies experiencing funny rhythms in the heart, arrhythmias, racing heart, etc.  Denies chest pain.  Denies chest pain or tightness on exertion.  Discharged on lasix 1-2 times per day and spironolactone, as well as portal hypertension dx. Patient notes she is doing alright on higher dose lasix; denies concerns with urination or constipation.  Denies nausea, denies concerns eating.  Confirms feeling well.  Says sometimes she feels weak, but nothing new from her normal.  Notes feeling a little dizzy when first getting out of bed in the mornings, but then "feels okay after that."  Patient is seeing a Dr. Randa Evens of [facility] gastroenterology this coming Friday.  She doesn't know if she has a main gastroenterologist.  - Diabetes Mellitus Follows up with Dr. Talmage Nap for management. Continues on Lantus and NovoLog as established. Notes her blood sugars have been running high, over 200, "two-something." Patient confirms following sliding scale and giving herself more or less as needed. States giving herself 15 each meal of short-acting.  States been doing "the twenty" Lantus twice a day. States she only gets confused about when to take medications "just once in a while, not very often."  She checks her blood sugar prior to eating; notes her sugar runs high all day.    Wt Readings from Last 3 Encounters:  05/05/19 160 lb 4.8 oz (72.7 kg)  04/18/19 176 lb 12.8 oz (80.2 kg)  01/05/19 180 lb (81.6 kg)   BP Readings from Last 3 Encounters:  05/05/19 (!) 101/56  04/18/19 (!) 102/42  01/05/19 (!) 145/73   Pulse Readings from Last 3 Encounters:  05/05/19 68  04/18/19 (!) 53  01/05/19 69   BMI Readings from Last 3 Encounters:  05/05/19 31.31 kg/m  04/18/19 33.41 kg/m  01/05/19 35.15 kg/m     Patient Care Team    Relationship Specialty Notifications Start End  Thomasene Lot, DO PCP - General Family Medicine  03/12/16   Nahser, Deloris Ping, MD  PCP - Cardiology Cardiology Admissions 10/28/18   Kalman Shan, MD Attending Physician Pulmonary Disease Abnormal results only, Admissions 12/07/12    Comment: severe COPD  Dorisann Frames, MD Consulting Physician Endocrinology  05/22/17   Weston Settle, MD Consulting Physician Hematology and Oncology  10/16/18   Sherrie George, MD Consulting Physician Ophthalmology  11/25/18    Comment: DM retinop specialist  Leone Brand, NP Nurse Practitioner Cardiology  12/31/18    Comment: works with dr. Kyra Manges, Earlyne Iba, RN [facility] Care Management  Admissions 01/06/19    Comment: Chronic Special Needs Plan Chronic Care Management Coordinator   Carman Ching, MD Consulting Physician Gastroenterology  05/05/19   Weston Settle, MD Consulting Physician Oncology  05/05/19 05/05/19   Comment: Hematology/ Onc     Patient Active Problem List   Diagnosis Date Noted  . Paroxysmal atrial fibrillation (HCC) 10/28/2018    Priority: High  . Liver cirrhosis secondary to NASH (HCC) 10/16/2018    Priority: High  . Pancytopenia (HCC) 09/26/2018    Priority: High  . Acute on chronic diastolic CHF (congestive heart failure) (HCC) 09/26/2018    Priority: High  . Microalbuminuria due to type 2 diabetes mellitus (HCC) 08/26/2018    Priority: High  . chronic Low platelet count 06/05/2017    Priority: High  . Hypertension associated with diabetes (HCC) 05/22/2017    Priority: High  . Morbidly obese (HCC)- BMI >er 35 w DM 06/23/2016    Priority: High  . chronic Leukopenia 05/11/2016    Priority: High  . Insulin dependent diabetes mellitus with complications (HCC) 03/13/2016    Priority: High  . 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 ILD (interstitial lung disease) (HCC) 11/25/2012    Priority: High  . Elevated serum alkaline phosphatase level- hepatocellular in origin 05/11/2016    Priority: Medium  .  Depression 03/13/2016    Priority: Medium  . Hypothyroidism 03/13/2016    Priority: Medium  . Thrombocytopenia, unspecified (HCC) 12/13/2012    Priority: Medium  . Coronary artery disease 04/11/2011    Priority: Medium  . COPD (chronic obstructive pulmonary disease) (HCC) 04/11/2011    Priority: Medium  . Obstructive sleep apnea 04/11/2011    Priority: Medium  . Chronic combined systolic (congestive) and diastolic (congestive) heart failure (HCC) 03/29/2008    Priority: Medium  . Gastroesophageal reflux disease 12/31/2017    Priority: Low  . Vitamin D deficiency 05/11/2016  Priority: Low  . Constipation, chronic/  IBS syndrome 03/13/2016    Priority: Low  . h/o Clostridium difficile colitis 12/12/2012    Priority: Low  . History of GI bleed 12/12/2012    Priority: Low  . Atherosclerosis of carotid arteries 04/03/2006    Priority: Low  . Ascites of liver 05/05/2019  . Portal hypertension (HCC) 05/05/2019  . Decompensated hepatic cirrhosis (HCC) 04/17/2019  . Myocardial infarct, old 01/01/2019  . COPD exacerbation (HCC) 09/26/2018  . Acute on chronic respiratory failure with hypoxia (HCC) 09/26/2018  . PNA (pneumonia) 09/15/2018  . Hypocalcemia 03/12/2018  . At high risk for osteoporosis 03/13/2016  . Hypotension 12/13/2012  . Hypokalemia due to excessive renal loss of potassium 12/13/2012  . Leg edema 01/07/2012    Past Medical history, Surgical history, Family history, Social history, Allergies and Medications have been entered into the medical record, reviewed and changed as needed.    Current Meds  Medication Sig  . albuterol (PROAIR HFA) 108 (90 Base) MCG/ACT inhaler Inhale 2 puffs into the lungs every 6 (six) hours as needed for wheezing or shortness of breath.  . Blood Glucose Monitoring Suppl (FREESTYLE LITE) DEVI Use to check blood sugars 2-3 times daily  . brimonidine (ALPHAGAN) 0.2 % ophthalmic solution Place 1 drop into both eyes daily.  .  Carboxymeth-Glyc-Polysorb PF (REFRESH OPTIVE MEGA-3) 0.5-1-0.5 % SOLN Apply 1-2 drops to eye 2 (two) times daily.   . Cholecalciferol (VITAMIN D3) 50 MCG (2000 UT) TABS Take 1 tablet by mouth daily.  Marland Kitchen EASY TOUCH PEN NEEDLES 31G X 5 MM MISC USE TO INJECT INSULIN 5 TIMES A DAY  . escitalopram (LEXAPRO) 20 MG tablet TAKE 1 TABLET (20 MG TOTAL) BY MOUTH DAILY.  . furosemide (LASIX) 40 MG tablet Take 1 to 2 tablets daily as directed.  . insulin aspart (NOVOLOG FLEXPEN) 100 UNIT/ML FlexPen Inject 10-40 units into ski 3 times daily with meals. Adjust as needed.  . Insulin Glargine (LANTUS SOLOSTAR) 100 UNIT/ML Solostar Pen Inject 30 Units into the skin 2 (two) times daily. Patient uses sliding scale for Lantus 20-60 units against medical advice  . isosorbide mononitrate (IMDUR) 30 MG 24 hr tablet Take 1 tablet (30 mg total) by mouth daily.  . Lancets (ONETOUCH DELICA PLUS LANCET33G) MISC USE TO CHECK BLOOD SUGAR THREE TIMES A DAY  . levothyroxine (SYNTHROID) 100 MCG tablet TAKE 1 TABLET BY MOUTH DAILY (Patient taking differently: Take 100 mcg by mouth daily. )  . loratadine (CLARITIN) 10 MG tablet Take 1 tablet (10 mg total) by mouth daily. (Patient taking differently: Take 10 mg by mouth daily as needed. )  . metoprolol tartrate (LOPRESSOR) 25 MG tablet Take 0.5 tablets (12.5 mg total) by mouth 2 (two) times daily.  . montelukast (SINGULAIR) 10 MG tablet TAKE 1 TABLET BY MOUTH EVERY EVENING. (Patient taking differently: Take 10 mg by mouth every evening. )  . Multiple Vitamins-Minerals (VISION FORMULA/LUTEIN) TABS Take 1 tablet by mouth daily.  . nitroGLYCERIN (NITROSTAT) 0.4 MG SL tablet Place 1 tab under the tongue every 5 minutes as needed for chest pain.  Letta Pate ULTRA test strip TEST BLOOD SUGAR 2-3 TIMES A DAY  . pantoprazole (PROTONIX) 20 MG tablet Take 1 tablet (20 mg total) by mouth 2 (two) times daily.  Marland Kitchen Propylene Glycol (SYSTANE BALANCE) 0.6 % SOLN Apply 1 drop to eye 2 (two) times daily.   . RESTASIS 0.05 % ophthalmic emulsion Place 1 drop into both eyes 2 (two) times daily.  1 drop both eyes twice daily  . spironolactone (ALDACTONE) 100 MG tablet Take 1 tablet (100 mg total) by mouth daily.  . White Petrolatum-Mineral Oil (GENTEAL TEARS NIGHT-TIME) OINT Apply 1 application to eye at bedtime.    Allergies:  Allergies  Allergen Reactions  . Ace Inhibitors Cough  . Benadryl [Diphenhydramine Hcl (Sleep)] Other (See Comments)    hypotension  . Fexofenadine Hcl Other (See Comments)  . Penicillins Hives and Swelling    Swelling of arms Has patient had a PCN reaction causing immediate rash, facial/tongue/throat swelling, SOB or lightheadedness with hypotension: unknown Has patient had a PCN reaction causing severe rash involving mucus membranes or skin necrosis: no Has patient had a PCN reaction that required hospitalization: unknown Has patient had a PCN reaction occurring within the last 10 years: no, childhood allergy If all of the above answers are "NO", then may proceed with Cephalosporin use.   . Sulfonamide Derivatives Swelling     Review of Systems:  A fourteen system review of systems was performed and found to be positive as per HPI.   Objective:   Blood pressure (!) 101/56, pulse 68, temperature 97.6 F (36.4 C), resp. rate 14, height 5' (1.524 m), weight 160 lb 4.8 oz (72.7 kg), SpO2 96 %. Body mass index is 31.31 kg/m. General:  Well Developed, well nourished, appropriate for stated age.  Neuro:  Alert and oriented,  extra-ocular muscles intact  HEENT:  Normocephalic, atraumatic, neck supple, no carotid bruits appreciated  Skin:  no gross rash, warm, pink. Cardiac:  Distant but RRR, S1 S2 Respiratory:  Distant but ECTA B/L and A/P, Not using accessory muscles, speaking in full sentences- unlabored. Abd:  No g/r/r, BS *4, no OM, mild distention, no signs portal htn Vascular:  Ext warm, no cyanosis apprec.; cap RF less 2 sec. Psych:  No HI/SI, judgement and  insight good, Euthymic mood. Full Affect.     Recent Results (from the past 2160 hour(s))  Iron, TIBC and Ferritin Panel     Status: None   Collection Time: 02/13/19 12:00 AM  Result Value Ref Range   Ferritin 121    Iron 87    TIBC 238    %SAT 36.5   CBC and differential     Status: Abnormal   Collection Time: 02/13/19 12:00 AM  Result Value Ref Range   Hemoglobin 14.0 12.0 - 16.0   HCT 42 36 - 46   Platelets 75 (A) 150 - 399   WBC 4.3   Basic metabolic panel     Status: Abnormal   Collection Time: 02/13/19 12:00 AM  Result Value Ref Range   Glucose 309    BUN 3 (A) 4 - 21   Creatinine 0.7 0.5 - 1.1   Potassium 3.5 3.4 - 5.3   Sodium 131 (A) 137 - 147  Hepatic function panel     Status: Abnormal   Collection Time: 02/13/19 12:00 AM  Result Value Ref Range   Alkaline Phosphatase 253 (A) 25 - 125   ALT 23 7 - 35   AST 38 (A) 13 - 35   Bilirubin, Total 1.2   Urinalysis, Routine w reflex microscopic     Status: Abnormal   Collection Time: 04/17/19 12:51 AM  Result Value Ref Range   Color, Urine YELLOW YELLOW   APPearance CLEAR CLEAR   Specific Gravity, Urine 1.005 1.005 - 1.030   pH 7.0 5.0 - 8.0   Glucose, UA NEGATIVE NEGATIVE mg/dL  Hgb urine dipstick SMALL (A) NEGATIVE   Bilirubin Urine NEGATIVE NEGATIVE   Ketones, ur NEGATIVE NEGATIVE mg/dL   Protein, ur NEGATIVE NEGATIVE mg/dL   Nitrite NEGATIVE NEGATIVE   Leukocytes,Ua NEGATIVE NEGATIVE   RBC / HPF 0-5 0 - 5 RBC/hpf   WBC, UA 0-5 0 - 5 WBC/hpf   Bacteria, UA RARE (A) NONE SEEN   Squamous Epithelial / LPF 0-5 0 - 5    Comment: Performed at [facility], [address], [place], [place] [address]  CBC with Differential     Status: Abnormal   Collection Time: 04/17/19  4:24 PM  Result Value Ref Range   WBC 3.9 (L) 4.0 - 10.5 K/uL   RBC 4.49 3.87 - 5.11 MIL/uL   Hemoglobin 13.1 12.0 - 15.0 g/dL   HCT 19.1 47.8 - 29.5 %   MCV 90.6 80.0 - 100.0 fL   MCH 29.2 26.0 - 34.0 pg   MCHC 32.2 30.0 - 36.0  g/dL   RDW 62.1 (H) 30.8 - 65.7 %   Platelets 78 (L) 150 - 400 K/uL    Comment: REPEATED TO VERIFY PLATELET COUNT CONFIRMED BY SMEAR SPECIMEN CHECKED FOR CLOTS Immature Platelet Fraction may be clinically indicated, consider ordering this additional test [id number]    nRBC 0.0 0.0 - 0.2 %   Neutrophils Relative % 61 %   Neutro Abs 2.4 1.7 - 7.7 K/uL   Lymphocytes Relative 15 %   Lymphs Abs 0.6 (L) 0.7 - 4.0 K/uL   Monocytes Relative 18 %   Monocytes Absolute 0.7 0.1 - 1.0 K/uL   Eosinophils Relative 4 %   Eosinophils Absolute 0.2 0.0 - 0.5 K/uL   Basophils Relative 1 %   Basophils Absolute 0.0 0.0 - 0.1 K/uL   Immature Granulocytes 1 %   Abs Immature Granulocytes 0.02 0.00 - 0.07 K/uL    Comment: Performed at [facility], [address], [place], [place] [address]  Comprehensive metabolic panel     Status: Abnormal   Collection Time: 04/17/19  4:24 PM  Result Value Ref Range   Sodium 140 135 - 145 mmol/L   Potassium 3.1 (L) 3.5 - 5.1 mmol/L   Chloride 103 98 - 111 mmol/L   CO2 29 22 - 32 mmol/L   Glucose, Bld 131 (H) 70 - 99 mg/dL   BUN <5 (L) 8 - 23 mg/dL   Creatinine, Ser 2.44 0.44 - 1.00 mg/dL   Calcium 8.2 (L) 8.9 - 10.3 mg/dL   Total Protein 6.2 (L) 6.5 - 8.1 g/dL   Albumin 2.4 (L) 3.5 - 5.0 g/dL   AST 30 15 - 41 U/L   ALT 16 0 - 44 U/L   Alkaline Phosphatase 165 (H) 38 - 126 U/L   Total Bilirubin 1.5 (H) 0.3 - 1.2 mg/dL   GFR calc non Af Amer >60 >60 mL/min   GFR calc Af Amer >60 >60 mL/min   Anion gap 8 5 - 15    Comment: Performed at [facility], [address] [place], [place] [address]  Brain natriuretic peptide     Status: Abnormal   Collection Time: 04/17/19  4:24 PM  Result Value Ref Range   B Natriuretic Peptide 150.2 (H) 0.0 - 100.0 pg/mL    Comment: Performed at [name], [address], [place], [place] [address]  Troponin I (High Sensitivity)     Status: None   Collection Time: 04/17/19  7:18 PM  Result Value Ref Range    Troponin I (High Sensitivity) 9 <18 ng/L    Comment: (NOTE) Elevated high sensitivity troponin I (hsTnI) values and significant  changes across serial measurements may suggest ACS but many other  chronic and acute conditions are known to elevate hsTnI results.  Refer to the "Links" section for chest pain algorithms and additional  guidance. Performed at [facility], [address], [place], [place] [address]   SARS Coronavirus 2 [facility] order, Performed in [facility] hospital lab) Nasopharyngeal Nasopharyngeal Swab     Status: None   Collection Time: 04/17/19  8:04 PM   Specimen: Nasopharyngeal Swab  Result Value Ref Range   SARS Coronavirus 2 NEGATIVE NEGATIVE    Comment: (NOTE) If result is NEGATIVE SARS-CoV-2 target nucleic acids are NOT DETECTED. The SARS-CoV-2 RNA is generally detectable in upper and lower  respiratory specimens during the acute phase of infection. The lowest  concentration of SARS-CoV-2 viral copies this assay can detect is 250  copies / mL. A negative result does not preclude SARS-CoV-2 infection  and should not be used as the sole basis for treatment or other  patient management decisions.  A negative result may occur with  improper specimen collection / handling, submission of specimen other  than nasopharyngeal swab, presence of viral mutation(s) within the  areas targeted by this assay, and inadequate number of viral copies  (<250 copies / mL). A negative result must be combined with clinical  observations, patient history, and epidemiological information. If result is POSITIVE SARS-CoV-2 target nucleic acids are DETECTED. The SARS-CoV-2 RNA is generally detectable in upper and lower  respiratory specimens dur ing the acute phase of infection.  Positive  results are indicative of active infection with SARS-CoV-2.  Clinical  correlation with patient history and other diagnostic information is  necessary to determine patient infection status.   Positive results do  not rule out bacterial infection or co-infection with other viruses. If result is PRESUMPTIVE POSTIVE SARS-CoV-2 nucleic acids MAY BE PRESENT.   A presumptive positive result was obtained on the submitted specimen  and confirmed on repeat testing.  While 2019 novel coronavirus  (SARS-CoV-2) nucleic acids may be present in the submitted sample  additional confirmatory testing may be necessary for epidemiological  and / or clinical management purposes  to differentiate between  SARS-CoV-2 and other Sarbecovirus currently known to infect humans.  If clinically indicated additional testing with an alternate test  methodology [phone]) is advised. The SARS-CoV-2 RNA is generally  detectable in upper and lower respiratory sp ecimens during the acute  phase of infection. The expected result is Negative. Fact Sheet for Patients:  BoilerBrush.com.cy Fact Sheet for Healthcare Providers: https://pope.com/ This test is not yet approved or cleared by the [place] FDA and has been authorized for detection and/or diagnosis of SARS-CoV-2 by FDA under an Emergency Use Authorization (EUA).  This EUA will remain in effect (meaning this test can be used) for the duration of the COVID-19 declaration under Section 564(b)(1) of the Act, 21 U.S.C. section 360bbb-3(b)(1), unless the authorization is terminated or revoked sooner. Performed at [facility], [address], [place], [place] [address]   Troponin I (High Sensitivity)     Status: None   Collection Time: 04/17/19  9:09 PM  Result Value Ref Range   Troponin I (High Sensitivity) 10 <18 ng/L    Comment: (NOTE) Elevated high sensitivity troponin I (hsTnI) values and significant  changes across serial  measurements may suggest ACS but many other  chronic and acute conditions are known to elevate hsTnI results.  Refer to the "Links" section for chest pain algorithms and  additional  guidance. Performed at [facility], [address] [place], [place] [address]   Basic metabolic panel     Status: Abnormal   Collection Time: 04/18/19  4:03 AM  Result Value Ref Range   Sodium 140 135 - 145 mmol/L   Potassium 3.1 (L) 3.5 - 5.1 mmol/L   Chloride 105 98 - 111 mmol/L   CO2 28 22 - 32 mmol/L   Glucose, Bld 129 (H) 70 - 99 mg/dL   BUN 5 (L) 8 - 23 mg/dL   Creatinine, Ser 8.110.75 0.44 - 1.00 mg/dL   Calcium 7.9 (L) 8.9 - 10.3 mg/dL   GFR calc non Af Amer >60 >60 mL/min   GFR calc Af Amer >60 >60 mL/min   Anion gap 7 5 - 15    Comment: Performed at [facility], [address] [place], [place] [address]  CBC     Status: Abnormal   Collection Time: 04/18/19  4:03 AM  Result Value Ref Range   WBC 2.7 (L) 4.0 - 10.5 K/uL   RBC 4.13 3.87 - 5.11 MIL/uL   Hemoglobin 12.2 12.0 - 15.0 g/dL   HCT 29.536.7 62.136.0 - 30.846.0 %   MCV 88.9 80.0 - 100.0 fL   MCH 29.5 26.0 - 34.0 pg   MCHC 33.2 30.0 - 36.0 g/dL   RDW 65.715.7 (H) 84.611.5 - 96.215.5 %   Platelets 57 (L) 150 - 400 K/uL    Comment: REPEATED TO VERIFY Immature Platelet Fraction may be clinically indicated, consider ordering this additional test [id number] CONSISTENT WITH PREVIOUS RESULT    nRBC 0.0 0.0 - 0.2 %    Comment: Performed at [facility], [address] [place], [place] [address]  Hepatic function panel     Status: Abnormal   Collection Time: 04/18/19  4:03 AM  Result Value Ref Range   Total Protein 5.8 (L) 6.5 - 8.1 g/dL   Albumin 2.0 (L) 3.5 - 5.0 g/dL   AST 28 15 - 41 U/L   ALT 14 0 - 44 U/L   Alkaline Phosphatase 139 (H) 38 - 126 U/L   Total Bilirubin 1.7 (H) 0.3 - 1.2 mg/dL   Bilirubin, Direct 0.4 (H) 0.0 - 0.2 mg/dL   Indirect Bilirubin 1.3 (H) 0.3 - 0.9 mg/dL    Comment: Performed at [facility], [address] [place], [place] [address]  Magnesium     Status: Abnormal   Collection Time: 04/18/19  4:03 AM  Result Value Ref Range   Magnesium 1.6 (L) 1.7 - 2.4 mg/dL     Comment: Performed at [facility], [address] [place], [place] [address]  TSH     Status: None   Collection Time: 04/18/19  4:03 AM  Result Value Ref Range   TSH 2.283 0.350 - 4.500 uIU/mL    Comment: Performed by a 3rd Generation assay with a functional sensitivity of <=0.01 uIU/mL. Performed at [facility], [address] [place], [place] [address]   Glucose, capillary     Status: Abnormal   Collection Time: 04/18/19  5:42 AM  Result Value Ref Range   Glucose-Capillary 113 (H) 70 - 99 mg/dL  Culture, body fluid-bottle     Status: None   Collection Time: 04/18/19 10:07  AM   Specimen: Peritoneal Washings  Result Value Ref Range   Specimen Description PERITONEAL    Special Requests NONE    Culture      NO GROWTH 5 DAYS Performed at [facility], [place] [address], [place], [place] [address]    Report Status 04/23/2019 FINAL   Gram stain     Status: None   Collection Time: 04/18/19 10:07 AM   Specimen: Peritoneal Washings  Result Value Ref Range   Specimen Description PERITONEAL    Special Requests NONE    Gram Stain      RARE WBC PRESENT, PREDOMINANTLY MONONUCLEAR NO ORGANISMS SEEN Performed at [facility], [place] [address], [place], [place] [address]    Report Status 04/19/2019 FINAL   Body fluid cell count with differential     Status: Abnormal   Collection Time: 04/18/19 10:49 AM  Result Value Ref Range   Fluid Type-FCT PARACENTESIS    Color, Fluid YELLOW YELLOW   Appearance, Fluid HAZY (A) CLEAR   Total Nucleated Cell Count, Fluid 104 0 - 1,000 cu mm   Neutrophil Count, Fluid 7 0 - 25 %   Lymphs, Fluid 50 %   Monocyte-Macrophage-Serous Fluid 43 (L) 50 - 90 %    Comment: Performed at [facility] [address] [place], [place] [address]  Albumin, pleural or peritoneal fluid     Status: None   Collection Time: 04/18/19 10:49 AM  Result Value Ref Range   Albumin, Fluid <1.0 g/dL    Comment: REPEATED TO VERIFY   Fluid Type-FALB  PARACENTESIS     Comment: Performed at [facility] [address], [place], [place] [address]  Protein, pleural or peritoneal fluid     Status: None   Collection Time: 04/18/19 10:49 AM  Result Value Ref Range   Total protein, fluid <3.0 g/dL    Comment: REPEATED TO VERIFY   Fluid Type-FTP PARACENTESIS     Comment: Performed at [facility] [address], [place], [place] [address]  Glucose, capillary     Status: Abnormal   Collection Time: 04/18/19 11:06 AM  Result Value Ref Range   Glucose-Capillary 156 (H) 70 - 99 mg/dL

## 2019-05-05 NOTE — Patient Instructions (Addendum)
°  Please have Dr. Oletta Lamas office after your GI appointment this Friday has been send over all of your medical records from the visit to [place] so we can put it in your chart.  Also Ms. Derry, please remember to increase your Lantus - the long-acting insulin to 30 units in the morning and 30 units 12 hours later.  If your fasting blood sugars are still over 200, please call Dr. Gardiner Barefoot office for further management.   Please follow their recommendations for sliding scale Lantus as well as their sliding scale insulin/ NovoLog recommendations  Also we will call you regarding your lower blood pressures and what medication changes to make at this time after we hear from your cardiologist.  I presume this may be Imdur since you are not having any chest pain etc., but do not want to make any assumptions until after I hear from your heart doctor.    Please make sure you follow-up with Dr. Oletta Lamas of gastroenterology this Friday and also, I recommend you follow-up with cardiology in the near future as well since these medications for your liver ascites also significantly can affect your blood pressure and heart as well.

## 2019-05-06 LAB — CBC WITH DIFFERENTIAL/PLATELET
Basophils Absolute: 0 10*3/uL (ref 0.0–0.2)
Basos: 1 %
EOS (ABSOLUTE): 0.1 10*3/uL (ref 0.0–0.4)
Eos: 3 %
Hematocrit: 43.7 % (ref 34.0–46.6)
Hemoglobin: 14.3 g/dL (ref 11.1–15.9)
Immature Grans (Abs): 0 10*3/uL (ref 0.0–0.1)
Immature Granulocytes: 0 %
Lymphocytes Absolute: 0.6 10*3/uL — ABNORMAL LOW (ref 0.7–3.1)
Lymphs: 14 %
MCH: 29.1 pg (ref 26.6–33.0)
MCHC: 32.7 g/dL (ref 31.5–35.7)
MCV: 89 fL (ref 79–97)
Monocytes Absolute: 0.5 10*3/uL (ref 0.1–0.9)
Monocytes: 11 %
Neutrophils Absolute: 3.2 10*3/uL (ref 1.4–7.0)
Neutrophils: 71 %
Platelets: 63 10*3/uL — CL (ref 150–450)
RBC: 4.92 x10E6/uL (ref 3.77–5.28)
RDW: 14.5 % (ref 11.7–15.4)
WBC: 4.5 10*3/uL (ref 3.4–10.8)

## 2019-05-06 LAB — BASIC METABOLIC PANEL
BUN/Creatinine Ratio: 15 (ref 12–28)
BUN: 13 mg/dL (ref 8–27)
CO2: 25 mmol/L (ref 20–29)
Calcium: 8.9 mg/dL (ref 8.7–10.3)
Chloride: 97 mmol/L (ref 96–106)
Creatinine, Ser: 0.84 mg/dL (ref 0.57–1.00)
GFR calc Af Amer: 75 mL/min/{1.73_m2} (ref >59)
GFR calc non Af Amer: 65 mL/min/{1.73_m2} (ref >59)
Glucose: 234 mg/dL — ABNORMAL HIGH (ref 65–99)
Potassium: 4.5 mmol/L (ref 3.5–5.2)
Sodium: 135 mmol/L (ref 134–144)

## 2019-05-06 LAB — MAGNESIUM: Magnesium: 1.8 mg/dL (ref 1.6–2.3)

## 2019-05-06 NOTE — Telephone Encounter (Signed)
Pt is aware, and verbalized understanding.

## 2019-05-06 NOTE — Telephone Encounter (Signed)
Left message on [phone] (mobile)

## 2019-05-13 DIAGNOSIS — Z9981 Dependence on supplemental oxygen: Secondary | ICD-10-CM | POA: Diagnosis not present

## 2019-05-13 DIAGNOSIS — Z8619 Personal history of other infectious and parasitic diseases: Secondary | ICD-10-CM | POA: Diagnosis not present

## 2019-05-13 DIAGNOSIS — K7581 Nonalcoholic steatohepatitis (NASH): Secondary | ICD-10-CM | POA: Diagnosis not present

## 2019-05-13 DIAGNOSIS — I2510 Atherosclerotic heart disease of native coronary artery without angina pectoris: Secondary | ICD-10-CM | POA: Diagnosis not present

## 2019-05-13 DIAGNOSIS — J449 Chronic obstructive pulmonary disease, unspecified: Secondary | ICD-10-CM | POA: Diagnosis not present

## 2019-05-13 DIAGNOSIS — K7460 Unspecified cirrhosis of liver: Secondary | ICD-10-CM | POA: Diagnosis not present

## 2019-05-18 ENCOUNTER — Encounter: Payer: Self-pay | Admitting: Family Medicine

## 2019-05-19 ENCOUNTER — Telehealth: Payer: Self-pay

## 2019-05-19 NOTE — Telephone Encounter (Signed)
Pt's daughter informed.  She expressed understanding and is agreeable.  Charyl Bigger, CMA

## 2019-05-20 DIAGNOSIS — J449 Chronic obstructive pulmonary disease, unspecified: Secondary | ICD-10-CM | POA: Diagnosis not present

## 2019-05-30 DIAGNOSIS — G4733 Obstructive sleep apnea (adult) (pediatric): Secondary | ICD-10-CM | POA: Diagnosis not present

## 2019-06-02 ENCOUNTER — Other Ambulatory Visit: Payer: Self-pay | Admitting: Family Medicine

## 2019-06-02 DIAGNOSIS — K219 Gastro-esophageal reflux disease without esophagitis: Secondary | ICD-10-CM

## 2019-06-03 ENCOUNTER — Encounter: Payer: Self-pay | Admitting: Family Medicine

## 2019-06-03 ENCOUNTER — Other Ambulatory Visit: Payer: Self-pay

## 2019-06-03 ENCOUNTER — Ambulatory Visit (INDEPENDENT_AMBULATORY_CARE_PROVIDER_SITE_OTHER): Payer: HMO | Admitting: Family Medicine

## 2019-06-03 VITALS — BP 124/65 | HR 60 | Wt 157.3 lb

## 2019-06-03 DIAGNOSIS — R0602 Shortness of breath: Secondary | ICD-10-CM

## 2019-06-03 DIAGNOSIS — J849 Interstitial pulmonary disease, unspecified: Secondary | ICD-10-CM

## 2019-06-03 DIAGNOSIS — R0600 Dyspnea, unspecified: Secondary | ICD-10-CM

## 2019-06-03 DIAGNOSIS — J3489 Other specified disorders of nose and nasal sinuses: Secondary | ICD-10-CM | POA: Diagnosis not present

## 2019-06-03 DIAGNOSIS — J3089 Other allergic rhinitis: Secondary | ICD-10-CM | POA: Diagnosis not present

## 2019-06-03 DIAGNOSIS — R519 Headache, unspecified: Secondary | ICD-10-CM

## 2019-06-03 DIAGNOSIS — R0609 Other forms of dyspnea: Secondary | ICD-10-CM

## 2019-06-03 MED ORDER — AZELASTINE HCL 0.1 % NA SOLN: NASAL | 11 refills | Status: DC

## 2019-06-03 MED ORDER — LEVOCETIRIZINE DIHYDROCHLORIDE 5 MG PO TABS: 5.0000 mg | ORAL_TABLET | Freq: Every evening | ORAL | 1 refills | Status: AC

## 2019-06-03 NOTE — Progress Notes (Signed)
Telehealth office visit note for [name], D.O- at [facility]   I connected with current patient today and verified that I am speaking with the correct person using two identifiers.   . Location of the patient: Home . Location of the provider: Office Only the patient (+/- their family members at pt's discretion) and myself were participating in the encounter - This visit type was conducted due to national recommendations for restrictions regarding the COVID-19 Pandemic (e.g. social distancing) in an effort to limit this patient's exposure and mitigate transmission in our community.  This format is felt to be most appropriate for this patient at this time.   - The patient did not have access to video technology or had technical difficulties with video requiring transitioning to audio format only. - No physical exam could be performed with this format, beyond that communicated to [place] by the patient/ family members as noted.   - Additionally my office staff/ schedulers discussed with the patient that there may be a monetary charge related to this service, depending on their medical insurance.   The patient expressed understanding, and agreed to proceed.       History of Present Illness:  Onset of symptoms: 3 days ago. Believes she has had symptoms for three days, and thought it would clear up, but hasn't.  Notes she has not been outside more than usual lately.  Sx: States she has had a headache and pain across her nose, and "nose dripping a Hogan." "I guess it's a sinus infection, I don't know what it is, but it's aggravating me." Confirms sinus pressure and pain with a runny nose.  Per patient, pain is located across her nose and across her forehead.  Confirms feeling more tired and winded than usual. States she noticed her feeling of windedness when she walked to the mailbox recently. This symptom has been new along with her sinus headache.  Denies  coughing.  Denies fever or chills, "not that I know of."  Denies loss of taste or smell.  States she was previously tested (04/17/2019) for COVID-19, which came back negative.  Per patient, using her Proair inhaler in the morning and afternoon, twice daily. Confirms taking Singulair nightly, and taking something like Claritin daily.  States she has been having some back pain, but "I don't know where on earth that is coming from."    GAD 7 : Generalized Anxiety Score 12/31/2018  Nervous, Anxious, on Edge 0  Control/stop worrying 0  Worry too much - different things 0  Trouble relaxing 0  Restless 0  Easily annoyed or irritable 0  Afraid - awful might happen 0  Total GAD 7 Score 0  Anxiety Difficulty Not difficult at all    Depression screen [facility] 2/9 05/05/2019 03/09/2019 12/31/2018 11/25/2018 10/16/2018  Decreased Interest 3 0 0 0 3  Down, Depressed, Hopeless 0 0 0 0 0  PHQ - 2 Score 3 0 0 0 3  Altered sleeping 0 - 0 0 2  Tired, decreased energy 1 - 3 0 2  Change in appetite 0 - 0 0 1  Feeling bad or failure about yourself  1 - 0 0 0  Trouble concentrating 0 - 1 0 3  Moving slowly or fidgety/restless 0 - 0 0 0  Suicidal thoughts 0 - 0 0 0  PHQ-9 Score 5 - 4 0 11  Difficult doing work/chores Not difficult at all - Somewhat difficult Not difficult  at all Somewhat difficult  Some recent data might be hidden     Impression and Recommendations:    1. ILD (interstitial lung disease) (HCC)   2. Sinus pressure   3. Rhinorrhea   4. Sinus headache   5. SOB (shortness of breath)   6. DOE (dyspnea on exertion)   7. Environmental and seasonal allergies      Sinus Pressure, Sinus Headache, Rhinorrhea, DOE, SOB - Discussed symptoms at length with patient.  - Advised patient to obtain COVID-19 test today. - Reviewed testing sites and testing site etiquette with patient. - [facility] between 9 AM and 4 PM.  - Emphasized need for COVID-19 testing  to obtain appropriate treatment.  - Between now and result of upcoming test, advised patient to drink extra fluids.  - Advised patient to continue to wear her mask, quarantine and stay in her home, and only visit with others while outside in open air, about ten feet away from people.    - Novel Covid -19 counseling done; all questions were answered.   - Current CDC / federal and [place] guidelines reviewed with patient  - Reminded pt of extreme importance of social distancing; wearing a mask when out in public; insensate handwashing and cleaning of surfaces, avoiding unnecessary trips for shopping and avoiding ALL but emergency appts etc. - Told patient to be prepared, not scared; and be smart for the sake of others  - Patient will call with any additional concerns  Insterstitial Lung Disease & Environmental & Seasonal Allergies - Advised patient to use her albuterol inhaler as needed prior to physical activity.  Discussed taking a couple of puffs before doing tasks such as cleaning the house or walking to the mailbox.  - Claritin discontinued and prescription Xyzal sent in. - Advised patient to take Xyzal daily during allergy season.  - Astelin nasal spray prescribed today to help with runny nose. - Advised the patient to begin using AYR or Neilmed sinus rinses BID followed by nasal spray BID (one spray to each nostril).  - Otherwise, continue treatment plan as prescribed.  See med list.  Recommendations - Discussed that if COVID-19 test returns negative and symptoms continue after 7-10 days, antibiotic may be indicated.  If at any time she feels W, notify us or go to ED  - Extensive education provided and all questions answered. - Will continue to monitor closely.  - As part of my medical decision making, I reviewed the following data within the electronic MEDICAL RECORD NUMBER History obtained from pt /family, CMA notes reviewed and incorporated if applicable, Labs reviewed,  Radiograph/ tests reviewed if applicable and OV notes from prior OV's with me, as well as other specialists she/he has seen since seeing me last, were all reviewed and used in my medical decision making process today.    - Additionally, discussion had with patient regarding our treatment plan, and their biases/concerns about that plan were used in my medical decision making today.    - The patient agreed with the plan and demonstrated an understanding of the instructions.   No barriers to understanding were identified.    - Red flag symptoms and signs discussed in detail.  Patient expressed understanding regarding what to do in case of emergency\ urgent symptoms.   - The patient was advised to call back or seek an in-person evaluation if the symptoms worsen or if the condition fails to improve as anticipated.  Return if symptoms worsen or fail to improve within 7-10 days, for Otherwise patient will F-up of current med issues as previously d/c pt.    Meds ordered this encounter  Medications  . azelastine (ASTELIN) 0.1 % nasal spray    Sig: 1 spray each nostril twice daily after sinus rinses    Dispense:  30 mL    Refill:  11  . levocetirizine (XYZAL) 5 MG tablet    Sig: Take 1 tablet (5 mg total) by mouth every evening. During allergy season only    Dispense:  90 tablet    Refill:  1    Medications Discontinued During This Encounter  Medication Reason  . isosorbide mononitrate (IMDUR) 30 MG 24 hr tablet Discontinued by provider  . loratadine (CLARITIN) 10 MG tablet       I provided 13 minutes of non face-to-face time during this encounter.  Additional time was spent with charting and coordination of care after the actual visit commenced.   Note:  This note was prepared with assistance of Dragon voice recognition software. Occasional wrong-word or sound-a-like substitutions may have occurred due to the inherent limitations of voice recognition software.   This document serves as a  record of services personally performed by Madison Lot, DO. It was created on her behalf by Peggye Fothergill, a trained medical scribe. The creation of this record is based on the scribe's personal observations and the provider's statements to them.   I have reviewed the above medical documentation for accuracy and completeness and I concur.  Madison Lot, DO 06/06/2019 2:49 PM        Patient Care Team    Relationship Specialty Notifications Start End  Madison Lot, DO PCP - General Family Medicine  03/12/16   Nahser, Deloris Ping, MD PCP - Cardiology Cardiology Admissions 10/28/18   Kalman Shan, MD Attending Physician Pulmonary Disease Abnormal results only, Admissions 12/07/12    Comment: severe COPD  Dorisann Frames, MD Consulting Physician Endocrinology  05/22/17   Weston Settle, MD Consulting Physician Hematology and Oncology  10/16/18   Sherrie George, MD Consulting Physician Ophthalmology  11/25/18    Comment: DM retinop specialist  Leone Brand, NP Nurse Practitioner Cardiology  12/31/18    Comment: works with dr. Kyra Manges, Earlyne Iba, RN [facility] Care Management  Admissions 01/06/19    Comment: Chronic Special Needs Plan Chronic Care Management Coordinator   Carman Ching, MD Consulting Physician Gastroenterology  05/05/19      -Vitals obtained; medications/ allergies reconciled;  personal medical, social, Sx etc.histories were updated by CMA, reviewed by me and are reflected in chart   Patient Active Problem List   Diagnosis Date Noted  . Paroxysmal atrial fibrillation (HCC) 10/28/2018    Priority: High  . Liver cirrhosis secondary to NASH (HCC) 10/16/2018    Priority: High  . Pancytopenia (HCC) 09/26/2018    Priority: High  . Acute on chronic diastolic CHF (congestive heart failure) (HCC) 09/26/2018    Priority: High  . Microalbuminuria due to type 2 diabetes mellitus (HCC) 08/26/2018    Priority: High  . chronic Low platelet  count 06/05/2017    Priority: High  . Hypertension associated with diabetes (HCC) 05/22/2017    Priority: High  . Morbidly obese (HCC)- BMI >er 35 w DM 06/23/2016    Priority: High  . chronic Leukopenia 05/11/2016    Priority: High  . Insulin dependent diabetes mellitus with complications 03/13/2016    Priority: High  .  Mixed diabetic hyperlipidemia associated with type 1 diabetes mellitus ([place]) 03/13/2016    Priority: High  . ILD (interstitial lung disease) ([place]) 11/25/2012    Priority: High  . Elevated serum alkaline phosphatase level- hepatocellular in origin 05/11/2016    Priority: Medium  . Depression 03/13/2016    Priority: Medium  . Hypothyroidism 03/13/2016    Priority: Medium  . Thrombocytopenia, unspecified ([place]) 12/13/2012    Priority: Medium  . Coronary artery disease 04/11/2011    Priority: Medium  . COPD (chronic obstructive pulmonary disease) ([place]) 04/11/2011    Priority: Medium  . Obstructive sleep apnea 04/11/2011    Priority: Medium  . Chronic combined systolic (congestive) and diastolic (congestive) heart failure ([place]) 03/29/2008    Priority: Medium  . Gastroesophageal reflux disease 12/31/2017    Priority: Low  . Vitamin D deficiency 05/11/2016    Priority: Low  . Constipation, chronic/  IBS syndrome 03/13/2016    Priority: Low  . h/o Clostridium difficile colitis 12/12/2012    Priority: Low  . History of GI bleed 12/12/2012    Priority: Low  . Atherosclerosis of carotid arteries 04/03/2006    Priority: Low  . Environmental and seasonal allergies 06/03/2019  . Ascites of liver 05/05/2019  . Portal hypertension ([place]) 05/05/2019  . Decompensated hepatic cirrhosis ([place]) 04/17/2019  . Myocardial infarct, old 01/01/2019  . COPD exacerbation ([place]) 09/26/2018  . Acute on chronic respiratory failure with hypoxia ([place]) 09/26/2018  . PNA (pneumonia) 09/15/2018  . Hypocalcemia 03/12/2018  . At high risk for osteoporosis 03/13/2016  . Hypotension 12/13/2012   . Hypokalemia due to excessive renal loss of potassium 12/13/2012  . Leg edema 01/07/2012     Current Meds  Medication Sig  . albuterol (PROAIR HFA) 108 (90 Base) MCG/ACT inhaler Inhale 2 puffs into the lungs every 6 (six) hours as needed for wheezing or shortness of breath.  . Blood Glucose Monitoring Suppl (FREESTYLE LITE) DEVI Use to check blood sugars 2-3 times daily  . brimonidine (ALPHAGAN) 0.2 % ophthalmic solution Place 1 drop into both eyes daily.  . Carboxymeth-Glyc-Polysorb PF (REFRESH OPTIVE MEGA-3) 0.5-1-0.5 % SOLN Apply 1-2 drops to eye 2 (two) times daily.   . Cholecalciferol (VITAMIN D3) 50 MCG (2000 UT) TABS Take 1 tablet by mouth daily.  Marland Kitchen EASY TOUCH PEN NEEDLES 31G X 5 MM MISC USE TO INJECT INSULIN 5 TIMES A DAY  . escitalopram (LEXAPRO) 20 MG tablet TAKE 1 TABLET (20 MG TOTAL) BY MOUTH DAILY.  . furosemide (LASIX) 40 MG tablet Take 1 to 2 tablets daily as directed.  . Insulin Glargine (LANTUS SOLOSTAR) 100 UNIT/ML Solostar Pen Inject 30 Units into the skin 2 (two) times daily. Patient uses sliding scale for Lantus 20-60 units against medical advice  . Lancets (ONETOUCH DELICA PLUS [id number]) MISC USE TO CHECK BLOOD SUGAR THREE TIMES A DAY  . levothyroxine (SYNTHROID) 100 MCG tablet TAKE 1 TABLET BY MOUTH DAILY (Patient taking differently: Take 100 mcg by mouth daily. )  . metoprolol tartrate (LOPRESSOR) 25 MG tablet Take 0.5 tablets (12.5 mg total) by mouth 2 (two) times daily.  . montelukast (SINGULAIR) 10 MG tablet TAKE 1 TABLET BY MOUTH EVERY EVENING.  . Multiple Vitamins-Minerals (VISION FORMULA/LUTEIN) TABS Take 1 tablet by mouth daily.  . nitroGLYCERIN (NITROSTAT) 0.4 MG SL tablet Place 1 tab under the tongue every 5 minutes as needed for chest pain.  Marland Kitchen NOVOLOG FLEXPEN 100 UNIT/ML FlexPen INJECT 20 UNITS SUBCUTANEOUSLY THREE TIMES A DAY BEFORE MEALS; ADJUST AND  INCREASE ACCORDINGLY.  Letta Pate ULTRA test strip TEST BLOOD SUGAR 2-3 TIMES A DAY  . pantoprazole  (PROTONIX) 20 MG tablet TAKE ONE TABLET BY MOUTH TWICE DAILY  . Propylene Glycol (SYSTANE BALANCE) 0.6 % SOLN Apply 1 drop to eye 2 (two) times daily.  . RESTASIS 0.05 % ophthalmic emulsion Place 1 drop into both eyes 2 (two) times daily. 1 drop both eyes twice daily  . spironolactone (ALDACTONE) 100 MG tablet Take 1 tablet (100 mg total) by mouth daily.  . White Petrolatum-Mineral Oil (GENTEAL TEARS NIGHT-TIME) OINT Apply 1 application to eye at bedtime.  . [DISCONTINUED] loratadine (CLARITIN) 10 MG tablet Take 1 tablet (10 mg total) by mouth daily. (Patient taking differently: Take 10 mg by mouth daily as needed. )     Allergies:  Allergies  Allergen Reactions  . Ace Inhibitors Cough  . Benadryl [Diphenhydramine Hcl (Sleep)] Other (See Comments)    hypotension  . Fexofenadine Hcl Other (See Comments)  . Penicillins Hives and Swelling    Swelling of arms Has patient had a PCN reaction causing immediate rash, facial/tongue/throat swelling, SOB or lightheadedness with hypotension: unknown Has patient had a PCN reaction causing severe rash involving mucus membranes or skin necrosis: no Has patient had a PCN reaction that required hospitalization: unknown Has patient had a PCN reaction occurring within the last 10 years: no, childhood allergy If all of the above answers are "NO", then may proceed with Cephalosporin use.   . Sulfonamide Derivatives Swelling     ROS:  See above HPI for pertinent positives and negatives   Objective:   Blood pressure 124/65, pulse 60, weight 157 lb 4.8 oz (71.4 kg).  (if some vitals are omitted, this means that patient was UNABLE to obtain them even though they were asked to get them prior to OV today.  They were asked to call us at their earliest convenience with these once obtained. )  General: A & O * 3; sounds in no acute distress; in usual state of health.  Skin: Pt confirms warm and dry extremities and pink fingertips HEENT: Pt confirms lips  non-cyanotic Chest: Patient confirms normal chest excursion and movement Respiratory: speaking in full sentences, no conversational dyspnea; patient confirms no use of accessory muscles Psych: insight appears good, mood- appears full

## 2019-06-04 ENCOUNTER — Other Ambulatory Visit: Payer: Self-pay

## 2019-06-04 DIAGNOSIS — Z20822 Contact with and (suspected) exposure to covid-19: Secondary | ICD-10-CM

## 2019-06-05 LAB — NOVEL CORONAVIRUS, NAA: SARS-CoV-2, NAA: NOT DETECTED

## 2019-06-09 ENCOUNTER — Telehealth: Payer: Self-pay | Admitting: Family Medicine

## 2019-06-09 NOTE — Telephone Encounter (Signed)
Patient was advised to call back for meds to be prescribed for her sinus infection. She had a COVID test too and that was neg. She is also requesting Diflucan if an abx is prescribed. If approved please send to [facility]

## 2019-06-09 NOTE — Telephone Encounter (Signed)
Please call and confirm that patient can take and tolerates  Augmentin 875 1 p.o. twice daily for 10 days.  Let her know this will cover for sinusitis or even early bronchitis.  If she has no additional symptoms and can tolerate this medicine well, please send this into her pharmacy of choice.  However, if patient is worse or has any additional concerning symptoms such as shortness of breath, wheeze, difficulty breathing, productive cough, fever /chills, then it is best she have another office visit to discuss her additional symptoms (ONLY if they are beyond what was documented in the previous office visit note on 06/03/2019.  Please see that note - the HPI section - for details of what patient's symptoms were then. )

## 2019-06-09 NOTE — Telephone Encounter (Signed)
Unsure of what medication she is referring to per office visit none mentioned to be sent in. Please advise on below

## 2019-06-10 MED ORDER — AZITHROMYCIN 250 MG PO TABS: ORAL_TABLET | ORAL | 0 refills | Status: DC

## 2019-06-10 MED ORDER — FLUCONAZOLE 150 MG PO TABS: ORAL_TABLET | ORAL | 1 refills | Status: DC

## 2019-06-10 NOTE — Telephone Encounter (Signed)
Pt's daughter, [name], states that pt experiences explosive diarrhea with Augmentin and tends to get c diff.  She states that she tolerates azithromycin better.  However, this does tend to cause a yeast infection and requests and RX for Diflucan as well.  Please advise.  Charyl Bigger, CMA

## 2019-06-10 NOTE — Addendum Note (Signed)
Addended by: Drucilla Schmidt on: 06/10/2019 09:24 AM   Modules accepted: Orders

## 2019-06-10 NOTE — Telephone Encounter (Signed)
Okay perfect.  Thank you for the information.    Please send in a Z-Pak as well as Diflucan 150 mg.  Sig for Diflucan would be 1 tab the day of her last antibiotic then repeat 1 tablet 1 week later as well.  Dispense 2,1 rf

## 2019-06-10 NOTE — Telephone Encounter (Signed)
Mychart message sent to patient and medications sent

## 2019-06-16 ENCOUNTER — Encounter: Payer: Self-pay | Admitting: Family Medicine

## 2019-06-16 ENCOUNTER — Other Ambulatory Visit: Payer: Self-pay

## 2019-06-16 ENCOUNTER — Ambulatory Visit (INDEPENDENT_AMBULATORY_CARE_PROVIDER_SITE_OTHER): Payer: HMO | Admitting: Family Medicine

## 2019-06-16 VITALS — BP 119/59 | HR 62 | Ht 60.0 in | Wt 157.0 lb

## 2019-06-16 DIAGNOSIS — K7460 Unspecified cirrhosis of liver: Secondary | ICD-10-CM | POA: Diagnosis not present

## 2019-06-16 DIAGNOSIS — E1069 Type 1 diabetes mellitus with other specified complication: Secondary | ICD-10-CM

## 2019-06-16 DIAGNOSIS — E1129 Type 2 diabetes mellitus with other diabetic kidney complication: Secondary | ICD-10-CM

## 2019-06-16 DIAGNOSIS — K7290 Hepatic failure, unspecified without coma: Secondary | ICD-10-CM | POA: Diagnosis not present

## 2019-06-16 DIAGNOSIS — E782 Mixed hyperlipidemia: Secondary | ICD-10-CM | POA: Diagnosis not present

## 2019-06-16 DIAGNOSIS — R809 Proteinuria, unspecified: Secondary | ICD-10-CM

## 2019-06-16 DIAGNOSIS — D61818 Other pancytopenia: Secondary | ICD-10-CM | POA: Diagnosis not present

## 2019-06-16 DIAGNOSIS — K7581 Nonalcoholic steatohepatitis (NASH): Secondary | ICD-10-CM

## 2019-06-16 DIAGNOSIS — E1159 Type 2 diabetes mellitus with other circulatory complications: Secondary | ICD-10-CM | POA: Diagnosis not present

## 2019-06-16 DIAGNOSIS — Z23 Encounter for immunization: Secondary | ICD-10-CM

## 2019-06-16 DIAGNOSIS — I10 Essential (primary) hypertension: Secondary | ICD-10-CM

## 2019-06-16 NOTE — Progress Notes (Signed)
Telehealth office visit note for [name], D.O- at Primary Care at [facility]   I connected with current patient today and verified that I am speaking with the correct person using two identifiers.   . Location of the patient: Home . Location of the provider: Office Only the patient (+/- their family members at pt's discretion) and myself were participating in the encounter - This visit type was conducted due to national recommendations for restrictions regarding the COVID-19 Pandemic (e.g. social distancing) in an effort to limit this patient's exposure and mitigate transmission in our community.  This format is felt to be most appropriate for this patient at this time.   - The patient did not have access to video technology or had technical difficulties with video requiring transitioning to audio format only. - No physical exam could be performed with this format, beyond that communicated to [place] by the patient/ family members as noted.   - Additionally my office staff/ schedulers discussed with the patient that there may be a monetary charge related to this service, depending on their medical insurance.   The patient expressed understanding, and agreed to proceed.       History of Present Illness:  I, [name], am serving as scribe for Dr. Mellody Hogan.   [facility] Visit in August  Patient was seen by her gastroenterologist Dr. Laurence Spates from [facility] on 05/13/2019 as a follow-up to her recent hospitalization due to NASH cirrhosis of the liver with resultant fluid overload requiring paracentesis.  Patient has remained on furosemide as well as spironolactone as directed by her gastroenterologist and currently blood pressure has been running on the low normal side with some dizziness if she gets up too quickly.  Last OV occurred in mid-September, when patient had just gotten out of the hospital.  Lantus dose was adjusted last appointment back to dosage prior to  patient's hospitalization.  Says "I've been able to do everything around the house and all; it probably needs more cleaning, but I'm not that energetic."  Confirms that she's feeling more like herself.  Denies concerns with her heart.  Notes went in recently for follow-up; "for nerves or something."  Says she doesn't remember what she was told at this follow-up.  Has not seen cardiology or endocrinology recently that she knows of.  States that her daughter "knows more about my medications than I do."  Per patient, she is not taking long-acting nitroglycerine; notes "that was called in because she said the one I had was old."  Says "they took me off of it."  Patient states she is not taking it.  HPI:  Hypertension:  -  Her blood pressure at home has been "okay," per patient.  She isn't sure "how low is too low."  Confirms sometimes when she wakes up in the morning, she feels dizzy, so she "stands there a little while" until she feels better.  Thinks she has been taking one lasix per day.  Denies SOB.  - Patient reports good compliance with medication and/or lifestyle modification  - Her denies acute concerns or problems related to treatment plan  - She denies new onset of: chest pain, exercise intolerance, shortness of breath, dizziness, visual changes, headache, lower extremity swelling or claudication.    Last 3 blood pressure readings in our office are as follows: BP Readings from Last 3 Encounters:  06/16/19 (!) 119/59  06/03/19 124/65  05/05/19 (!) 101/56   Filed  Weights   06/16/19 1331  Weight: 157 lb (71.2 kg)    HPI:   Diabetes Mellitus:  Home glucose readings:  States her sugars have been running 102, 200. Thinks her sugars have been "better."  Denies concerns with her sugars.  Denies lows.  States she thinks she is due to see Dr. Talmage NapBalan in 2-3 months.   - Patient reports good compliance with therapy plan: medication and/or lifestyle modification  - Her denies acute  concerns or problems related to treatment plan  - She denies new concerns.  Denies polyuria/polydipsia, hypo/ hyperglycemia symptoms.  Denies new onset of: chest pain, exercise intolerance, shortness of breath, dizziness, visual changes, headache, lower extremity swelling or claudication.   Last A1C in the office was:  Lab Results  Component Value Date   HGBA1C 7.7 10/28/2018   HGBA1C 9.0 (H) 08/26/2018   HGBA1C 8.4 (A) 07/14/2018   Lab Results  Component Value Date   MICROALBUR 20 08/26/2018   LDLCALC 96 10/28/2018   CREATININE 0.84 05/05/2019   BP Readings from Last 3 Encounters:  06/16/19 (!) 119/59  06/03/19 124/65  05/05/19 (!) 101/56   Wt Readings from Last 3 Encounters:  06/16/19 157 lb (71.2 kg)  06/03/19 157 lb 4.8 oz (71.4 kg)  05/05/19 160 lb 4.8 oz (72.7 kg)    GAD 7 : Generalized Anxiety Score 12/31/2018  Nervous, Anxious, on Edge 0  Control/stop worrying 0  Worry too much - different things 0  Trouble relaxing 0  Restless 0  Easily annoyed or irritable 0  Afraid - awful might happen 0  Total GAD 7 Score 0  Anxiety Difficulty Not difficult at all    Dep[facility] screen [facility] 05/05/2019 03/09/2019 12/31/2018 11/25/2018 10/16/2018  Decreased Interest 3 0 0 0 3  Down, Depressed, Hopeless 0 0 0 0 0  PHQ - 2 Score 3 0 0 0 3  Altered sleeping 0 - 0 0 2  Tired, decreased energy 1 - 3 0 2  Change in appetite 0 - 0 0 1  Feeling bad or failure about yourself  1 - 0 0 0  Trouble concentrating 0 - 1 0 3  Moving slowly or fidgety/restless 0 - 0 0 0  Suicidal thoughts 0 - 0 0 0  PHQ-9 Score 5 - 4 0 11  Difficult doing work/chores Not difficult at all - Somewhat difficult Not difficult at all Somewhat difficult  Some recent data might be hidden      Impression and Recommendations:    1. Hypertension associated with diabetes (HCC)   2. Microalbuminuria due to type 2 diabetes mellitus (HCC)   3. Liver cirrhosis secondary to NASH (HCC)   4. Decompensated hepatic  cirrhosis (HCC)   5. Mixed diabetic hyperlipidemia associated with type 1 diabetes mellitus (HCC)   6. Pancytopenia (HCC)   7. Need for influenza vaccination     - Advised return in near future to obtain labs   Diabetes Mellitus - Stable per patient; need for A1c re-check. - Pt NEEDS to f/up with her ENDO doc. (apparently she has not done so yet) - No changes made to treatment plan today. - Patient knows to continue management as prescribed.  See med list. - Will continue to monitor closely.   Hypertension associated with DM - Low BP, Low Pulse - Given low blood pressures running around 119/59 along with patient being symptomatic with positional changes, discussed adjusting medications-but explained this needs to be done by her cardiology and gastroenterology  team.  --will also have patient follow up with cardiology for blood pressure, pulse, and heart rate management, as heart rate appears to be on the low normal side, and patient is on lowest dose of metoprolol possible. -I am not comfortable decreasing her metoprolol as it is already at low dose and this would need to be approved through her cardiologist.  -I am also not comfortable decreasing diuretics due to her recent cirrhosis and need for paracentesis of 3 L off her abdomen.  This needs to be managed by her gastroenterologist Dr. Carman Ching.  - Patient continues on lasix and spironolactone and management as prescribed by gastroenterology. - Discussed need for patient to confer with both of these specialty teams regarding changes in BP medications.-  -We will have referral coordinator, Joselyn Glassman in our office today call to get patient an appointment in the near future with Cardiology. -Also told patient she needs to follow-up with her gastroenterologist Dr. Carman Ching sooner than later for med mgt! -I also sent a personal message to Dr. Randa Evens explaining the situation of patient being a little dizzy and having low blood  pressures, asked him to please reach out to the patient regarding modification/changes to her medications due to the symptoms. - Discussed need for patient to have a video visit with cardiology to go over medications given low BP, low pulse, and dizziness during positional changes.  - Advised patient to follow up with Dr. Elease Hashimoto   - If patient is ever feeling symptomatic, poorly or dizzy, advised patient to contact both her cardiologist and gastroenterologist to discuss changes to her medication regimen   Liver Cirrhosis Secondary to NASH; Decompensated Hepatic Cirrhosis -Per patient weight has been stable, she denies any current symptoms and appears stable - Will reach out to GI regarding patient's diuretics causing low blood pressure for further advice since they are managing her.  Thus, will defer to specialist for medication management.  " note to Gi: Dr. Randa Evens,  This is Dr. Sharee Holster and I am getting in touch with you regarding our mutual patient Ms. [employer].    I know she saw you at the end of September for follow-up of her cirrhosis and Elita Boone, which recently required paracentesis of her abdomen of over 3 L.   I know you have her on furosemide as well as spironolactone for treatment of this.    I just want her reach out to you as her blood pressure has been in the low normal range and she is getting dizzy if she gets up too quickly.  I know she needs to be on these diuretics and hence, I am not comfortable modifying her medications since she is on these for her cirrhosis/[place].      I am hoping you or your staff can reach out to Ms. Idamae regarding medication treatment changes to prevent her from falling etc.  Thank you for your expertise in the care of our mutual patient.  Lance Galas"    Recommendations - Return in near future for lab work as ordered, and influenza vaccination. - Reminded patient to follow up with cardiology and endocrinology as advised.   - As part of my medical  decision making, I reviewed the following data within the electronic MEDICAL RECORD NUMBER History obtained from pt /family, CMA notes reviewed and incorporated if applicable, Labs reviewed, Radiograph/ tests reviewed if applicable and OV notes from prior OV's with me, as well as other specialists she/he has seen since seeing me last, were  all reviewed and used in my medical decision making process today.    - Additionally, discussion had with patient regarding our treatment plan, and their biases/concerns about that plan were used in my medical decision making today.    - The patient agreed with the plan and demonstrated an understanding of the instructions.   No barriers to understanding were identified.    - Red flag symptoms and signs discussed in detail.  Patient expressed understanding regarding what to do in case of emergency\ urgent symptoms.   - The patient was advised to call back or seek an in-person evaluation if the symptoms worsen or if the condition fails to improve as anticipated.   Return for near future for lab work as ordered, and influenza vaccination.    Orders Placed This Encounter  Procedures  . Flu Vaccine QUAD 6+ mos PF IM (Fluarix Quad PF)  . Lipid panel  . Comprehensive metabolic panel  . CBC with Differential/Platelet  . TSH  . Microalbumin / creatinine urine ratio  . Phosphorus  . Magnesium    I provided 21+ minutes of non face-to-face time during this encounter.  Additional time was spent with charting and coordination of care after the actual visit commenced.   Note:  This note was prepared with assistance of Dragon voice recognition software. Occasional wrong-word or sound-a-like substitutions may have occurred due to the inherent limitations of voice recognition software.  This document serves as a record of services personally performed by Thomasene Lot, DO. It was created on her behalf by Peggye Fothergill, a trained medical scribe. The creation of this  record is based on the scribe's personal observations and the provider's statements to them.   This case required medical decision making of at least moderate complexity. The above documentation has been reviewed to be accurate and was completed by Carlye Grippe, D.O.     Patient Care Team    Relationship Specialty Notifications Start End  Thomasene Lot, DO PCP - General Family Medicine  03/12/16   Nahser, Deloris Ping, MD PCP - Cardiology Cardiology Admissions 10/28/18   Kalman Shan, MD Attending Physician Pulmonary Disease Abnormal results only, Admissions 12/07/12    Comment: severe COPD  Dorisann Frames, MD Consulting Physician Endocrinology  05/22/17   Weston Settle, MD Consulting Physician Hematology and Oncology  10/16/18   Sherrie George, MD Consulting Physician Ophthalmology  11/25/18    Comment: DM retinop specialist  Leone Brand, NP Nurse Practitioner Cardiology  12/31/18    Comment: works with dr. Kyra Manges, Earlyne Iba, RN [facility] Care Management  Admissions 01/06/19    Comment: Chronic Special Needs Plan Chronic Care Management Coordinator   Carman Ching, MD Consulting Physician Gastroenterology  05/05/19     -Vitals obtained; medications/ allergies reconciled;  personal medical, social, Sx etc.histories were updated by CMA, reviewed by me and are reflected in chart   Patient Active Problem List   Diagnosis Date Noted  . Paroxysmal atrial fibrillation (HCC) 10/28/2018    Priority: High  . Liver cirrhosis secondary to NASH (HCC) 10/16/2018    Priority: High  . Pancytopenia (HCC) 09/26/2018    Priority: High  . Acute on chronic diastolic CHF (congestive heart failure) (HCC) 09/26/2018    Priority: High  . Microalbuminuria due to type 2 diabetes mellitus (HCC) 08/26/2018    Priority: High  . chronic Low platelet count 06/05/2017    Priority: High  . Hypertension associated with diabetes (HCC) 05/22/2017  Priority: High  . Morbidly  obese (HCC)- BMI >er 35 w DM 06/23/2016    Priority: High  . chronic Leukopenia 05/11/2016    Priority: High  . Insulin dependent diabetes mellitus with complications 03/13/2016    Priority: High  . 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 ILD (interstitial lung disease) (HCC) 11/25/2012    Priority: High  . Elevated serum alkaline phosphatase level- hepatocellular in origin 05/11/2016    Priority: Medium  . Depression 03/13/2016    Priority: Medium  . Hypothyroidism 03/13/2016    Priority: Medium  . Thrombocytopenia, unspecified (HCC) 12/13/2012    Priority: Medium  . Coronary artery disease 04/11/2011    Priority: Medium  . COPD (chronic obstructive pulmonary disease) (HCC) 04/11/2011    Priority: Medium  . Obstructive sleep apnea 04/11/2011    Priority: Medium  . Chronic combined systolic (congestive) and diastolic (congestive) heart failure (HCC) 03/29/2008    Priority: Medium  . Gastroesophageal reflux disease 12/31/2017    Priority: Low  . Vitamin D deficiency 05/11/2016    Priority: Low  . Constipation, chronic/  IBS syndrome 03/13/2016    Priority: Low  . h/o Clostridium difficile colitis 12/12/2012    Priority: Low  . History of GI bleed 12/12/2012    Priority: Low  . Atherosclerosis of carotid arteries 04/03/2006    Priority: Low  . Environmental and seasonal allergies 06/03/2019  . Ascites of liver 05/05/2019  . Portal hypertension (HCC) 05/05/2019  . Decompensated hepatic cirrhosis (HCC) 04/17/2019  . Myocardial infarct, old 01/01/2019  . COPD exacerbation (HCC) 09/26/2018  . Acute on chronic respiratory failure with hypoxia (HCC) 09/26/2018  . PNA (pneumonia) 09/15/2018  . Hypocalcemia 03/12/2018  . At high risk for osteoporosis 03/13/2016  . Hypotension 12/13/2012  . Hypokalemia due to excessive renal loss of potassium 12/13/2012  . Leg edema 01/07/2012     Current Meds  Medication  Sig  . albuterol (PROAIR HFA) 108 (90 Base) MCG/ACT inhaler Inhale 2 puffs into the lungs every 6 (six) hours as needed for wheezing or shortness of breath.  Marland Kitchen azelastine (ASTELIN) 0.1 % nasal spray 1 spray each nostril twice daily after sinus rinses  . azithromycin (ZITHROMAX) 250 MG tablet Take as prescribed  . Blood Glucose Monitoring Suppl (FREESTYLE LITE) DEVI Use to check blood sugars 2-3 times daily  . brimonidine (ALPHAGAN) 0.2 % ophthalmic solution Place 1 drop into both eyes daily.  . Carboxymeth-Glyc-Polysorb PF (REFRESH OPTIVE MEGA-3) 0.5-1-0.5 % SOLN Apply 1-2 drops to eye 2 (two) times daily.   . Cholecalciferol (VITAMIN D3) 50 MCG (2000 UT) TABS Take 1 tablet by mouth daily.  Marland Kitchen EASY TOUCH PEN NEEDLES 31G X 5 MM MISC USE TO INJECT INSULIN 5 TIMES A DAY  . escitalopram (LEXAPRO) 20 MG tablet TAKE 1 TABLET (20 MG TOTAL) BY MOUTH DAILY.  . fluconazole (DIFLUCAN) 150 MG tablet Take 1 tablet the day of last antibiotic and 1 tablet a week later.  . furosemide (LASIX) 40 MG tablet Take 1 to 2 tablets daily as directed.  . Insulin Glargine (LANTUS SOLOSTAR) 100 UNIT/ML Solostar Pen Inject 30 Units into the skin 2 (two) times daily. Patient uses sliding scale for Lantus 20-60 units against medical advice  . Lancets (ONETOUCH DELICA PLUS LANCET33G) MISC USE TO CHECK BLOOD SUGAR THREE TIMES A DAY  . levocetirizine (XYZAL) 5 MG tablet Take 1 tablet (5 mg total) by mouth every evening. During  allergy season only  . levothyroxine (SYNTHROID) 100 MCG tablet TAKE 1 TABLET BY MOUTH DAILY (Patient taking differently: Take 100 mcg by mouth daily. )  . metoprolol tartrate (LOPRESSOR) 25 MG tablet Take 0.5 tablets (12.5 mg total) by mouth 2 (two) times daily.  . montelukast (SINGULAIR) 10 MG tablet TAKE 1 TABLET BY MOUTH EVERY EVENING.  . Multiple Vitamins-Minerals (VISION FORMULA/LUTEIN) TABS Take 1 tablet by mouth daily.  . nitroGLYCERIN (NITROSTAT) 0.4 MG SL tablet Place 1 tab under the tongue every 5  minutes as needed for chest pain.  Marland Kitchen NOVOLOG FLEXPEN 100 UNIT/ML FlexPen INJECT 20 UNITS SUBCUTANEOUSLY THREE TIMES A DAY BEFORE MEALS; ADJUST AND INCREASE ACCORDINGLY.  Letta Pate ULTRA test strip TEST BLOOD SUGAR 2-3 TIMES A DAY  . pantoprazole (PROTONIX) 20 MG tablet TAKE ONE TABLET BY MOUTH TWICE DAILY  . Propylene Glycol (SYSTANE BALANCE) 0.6 % SOLN Apply 1 drop to eye 2 (two) times daily.  . RESTASIS 0.05 % ophthalmic emulsion Place 1 drop into both eyes 2 (two) times daily. 1 drop both eyes twice daily  . spironolactone (ALDACTONE) 100 MG tablet Take 1 tablet (100 mg total) by mouth daily.  . White Petrolatum-Mineral Oil (GENTEAL TEARS NIGHT-TIME) OINT Apply 1 application to eye at bedtime.     Allergies:  Allergies  Allergen Reactions  . Ace Inhibitors Cough  . Benadryl [Diphenhydramine Hcl (Sleep)] Other (See Comments)    hypotension  . Fexofenadine Hcl Other (See Comments)  . Penicillins Hives and Swelling    Swelling of arms Has patient had a PCN reaction causing immediate rash, facial/tongue/throat swelling, SOB or lightheadedness with hypotension: unknown Has patient had a PCN reaction causing severe rash involving mucus membranes or skin necrosis: no Has patient had a PCN reaction that required hospitalization: unknown Has patient had a PCN reaction occurring within the last 10 years: no, childhood allergy If all of the above answers are "NO", then may proceed with Cephalosporin use.   . Sulfonamide Derivatives Swelling     ROS:  See above HPI for pertinent positives and negatives   Objective:   Blood pressure (!) 119/59, pulse 62, height 5' (1.524 m), weight 157 lb (71.2 kg).  (if some vitals are omitted, this means that patient was UNABLE to obtain them even though they were asked to get them prior to OV today.  They were asked to call us at their earliest convenience with these once obtained. )  General: A & O * 3; sounds in no acute distress; in usual state of  health.  Skin: Pt confirms warm and dry extremities and pink fingertips HEENT: Pt confirms lips non-cyanotic Chest: Patient confirms normal chest excursion and movement Respiratory: speaking in full sentences, no conversational dyspnea; patient confirms no use of accessory muscles Psych: insight appears good, mood- appears full

## 2019-06-19 ENCOUNTER — Other Ambulatory Visit: Payer: Self-pay

## 2019-06-19 ENCOUNTER — Other Ambulatory Visit (INDEPENDENT_AMBULATORY_CARE_PROVIDER_SITE_OTHER): Payer: HMO

## 2019-06-19 DIAGNOSIS — R188 Other ascites: Secondary | ICD-10-CM

## 2019-06-19 DIAGNOSIS — E1069 Type 1 diabetes mellitus with other specified complication: Secondary | ICD-10-CM

## 2019-06-19 DIAGNOSIS — K7581 Nonalcoholic steatohepatitis (NASH): Secondary | ICD-10-CM

## 2019-06-19 DIAGNOSIS — Z23 Encounter for immunization: Secondary | ICD-10-CM | POA: Diagnosis not present

## 2019-06-19 DIAGNOSIS — K7460 Unspecified cirrhosis of liver: Secondary | ICD-10-CM

## 2019-06-19 DIAGNOSIS — I708 Atherosclerosis of other arteries: Secondary | ICD-10-CM | POA: Diagnosis not present

## 2019-06-19 DIAGNOSIS — E782 Mixed hyperlipidemia: Secondary | ICD-10-CM | POA: Diagnosis not present

## 2019-06-19 DIAGNOSIS — IMO0001 Reserved for inherently not codable concepts without codable children: Secondary | ICD-10-CM

## 2019-06-19 DIAGNOSIS — I2510 Atherosclerotic heart disease of native coronary artery without angina pectoris: Secondary | ICD-10-CM | POA: Diagnosis not present

## 2019-06-19 DIAGNOSIS — E1159 Type 2 diabetes mellitus with other circulatory complications: Secondary | ICD-10-CM

## 2019-06-19 DIAGNOSIS — E1129 Type 2 diabetes mellitus with other diabetic kidney complication: Secondary | ICD-10-CM

## 2019-06-19 DIAGNOSIS — I5033 Acute on chronic diastolic (congestive) heart failure: Secondary | ICD-10-CM

## 2019-06-19 DIAGNOSIS — D61818 Other pancytopenia: Secondary | ICD-10-CM

## 2019-06-19 DIAGNOSIS — K5909 Other constipation: Secondary | ICD-10-CM

## 2019-06-19 DIAGNOSIS — R809 Proteinuria, unspecified: Secondary | ICD-10-CM

## 2019-06-19 DIAGNOSIS — I10 Essential (primary) hypertension: Secondary | ICD-10-CM | POA: Diagnosis not present

## 2019-06-19 DIAGNOSIS — I7090 Unspecified atherosclerosis: Secondary | ICD-10-CM

## 2019-06-19 DIAGNOSIS — K7290 Hepatic failure, unspecified without coma: Secondary | ICD-10-CM

## 2019-06-19 NOTE — Progress Notes (Signed)
Pt here for influenza vaccine.  Screening questionnaire reviewed, VIS provided to patient, and any/all patient questions answered.  T. Nelson, CMA  

## 2019-06-20 DIAGNOSIS — J449 Chronic obstructive pulmonary disease, unspecified: Secondary | ICD-10-CM | POA: Diagnosis not present

## 2019-06-24 LAB — CBC WITH DIFFERENTIAL/PLATELET
Basophils Absolute: 0.1 10*3/uL (ref 0.0–0.2)
Basos: 2 %
EOS (ABSOLUTE): 0.1 10*3/uL (ref 0.0–0.4)
Eos: 2 %
Hematocrit: 45.6 % (ref 34.0–46.6)
Hemoglobin: 15.8 g/dL (ref 11.1–15.9)
Immature Grans (Abs): 0 10*3/uL (ref 0.0–0.1)
Immature Granulocytes: 0 %
Lymphocytes Absolute: 0.8 10*3/uL (ref 0.7–3.1)
Lymphs: 17 %
MCH: 30.7 pg (ref 26.6–33.0)
MCHC: 34.6 g/dL (ref 31.5–35.7)
MCV: 89 fL (ref 79–97)
Monocytes Absolute: 0.7 10*3/uL (ref 0.1–0.9)
Monocytes: 16 %
Neutrophils Absolute: 2.9 10*3/uL (ref 1.4–7.0)
Neutrophils: 63 %
Platelets: 92 10*3/uL — CL (ref 150–450)
RBC: 5.15 x10E6/uL (ref 3.77–5.28)
RDW: 14.7 % (ref 11.7–15.4)
WBC: 4.6 10*3/uL (ref 3.4–10.8)

## 2019-06-24 LAB — LIPID PANEL
Chol/HDL Ratio: 15.9 ratio — ABNORMAL HIGH (ref 0.0–4.4)
Cholesterol, Total: 270 mg/dL — ABNORMAL HIGH (ref 100–199)
HDL: 17 mg/dL — ABNORMAL LOW (ref >39)
LDL Chol Calc (NIH): 220 mg/dL — ABNORMAL HIGH (ref 0–99)
Triglycerides: 169 mg/dL — ABNORMAL HIGH (ref 0–149)
VLDL Cholesterol Cal: 33 mg/dL (ref 5–40)

## 2019-06-24 LAB — PHOSPHORUS: Phosphorus: 4.3 mg/dL (ref 3.0–4.3)

## 2019-06-24 LAB — COMPREHENSIVE METABOLIC PANEL
ALT: 42 IU/L — ABNORMAL HIGH (ref 0–32)
AST: 29 IU/L (ref 0–40)
Albumin/Globulin Ratio: 1.1 — ABNORMAL LOW (ref 1.2–2.2)
Albumin: 3.8 g/dL (ref 3.6–4.6)
Alkaline Phosphatase: 286 IU/L — ABNORMAL HIGH (ref 39–117)
BUN/Creatinine Ratio: 22 (ref 12–28)
BUN: 20 mg/dL (ref 8–27)
Bilirubin Total: 1.2 mg/dL (ref 0.0–1.2)
CO2: 24 mmol/L (ref 20–29)
Calcium: 9.7 mg/dL (ref 8.7–10.3)
Chloride: 98 mmol/L (ref 96–106)
Creatinine, Ser: 0.9 mg/dL (ref 0.57–1.00)
GFR calc Af Amer: 69 mL/min/{1.73_m2} (ref >59)
GFR calc non Af Amer: 60 mL/min/{1.73_m2} (ref >59)
Globulin, Total: 3.6 g/dL (ref 1.5–4.5)
Glucose: 196 mg/dL — ABNORMAL HIGH (ref 65–99)
Potassium: 4.8 mmol/L (ref 3.5–5.2)
Sodium: 136 mmol/L (ref 134–144)
Total Protein: 7.4 g/dL (ref 6.0–8.5)

## 2019-06-24 LAB — TRANSFERRIN: Transferrin: 227 mg/dL (ref 149–313)

## 2019-06-24 LAB — T4, FREE: Free T4: 1.33 ng/dL (ref 0.82–1.77)

## 2019-06-24 LAB — TSH: TSH: 1.32 u[IU]/mL (ref 0.450–4.500)

## 2019-06-24 LAB — HEMOGLOBIN A1C
Est. average glucose Bld gHb Est-mCnc: 258 mg/dL
Hgb A1c MFr Bld: 10.6 % — ABNORMAL HIGH (ref 4.8–5.6)

## 2019-06-24 LAB — RETICULOCYTES: Retic Ct Pct: 2.1 % (ref 0.6–2.6)

## 2019-06-25 ENCOUNTER — Other Ambulatory Visit: Payer: Self-pay | Admitting: Gastroenterology

## 2019-06-25 ENCOUNTER — Other Ambulatory Visit: Payer: Self-pay | Admitting: *Deleted

## 2019-06-25 DIAGNOSIS — K7469 Other cirrhosis of liver: Secondary | ICD-10-CM | POA: Diagnosis not present

## 2019-06-25 DIAGNOSIS — K7460 Unspecified cirrhosis of liver: Secondary | ICD-10-CM

## 2019-06-25 DIAGNOSIS — R188 Other ascites: Secondary | ICD-10-CM | POA: Diagnosis not present

## 2019-06-25 DIAGNOSIS — R748 Abnormal levels of other serum enzymes: Secondary | ICD-10-CM | POA: Diagnosis not present

## 2019-06-26 ENCOUNTER — Ambulatory Visit: Payer: Self-pay | Admitting: *Deleted

## 2019-06-29 ENCOUNTER — Telehealth: Payer: Self-pay | Admitting: Cardiovascular Disease

## 2019-06-29 NOTE — Telephone Encounter (Signed)
Spoke with patient about her elevated cholesterol level and she states I will need to talk to her daughter, [name]. I called [name] per patient's request. [name] is a CMA so she manages patient's care. I reviewed the concern regarding patient's LDL >200 with [name] and she advised that patient had a poor diet over the past few months until recently when [name] arranged Meals On Wheels for her. Patient is currently having testing done for liver ascites and [name] will await those test results before we restart any cholesterol medicine. I advised her to call back when testing is complete and we will reevaluate at that time. [name] thanked me for the call and states she will update her mother on our plan. I thanked her for her help.

## 2019-06-29 NOTE — Telephone Encounter (Signed)
Patient returning call in regards to her cholesterol levels.

## 2019-06-30 ENCOUNTER — Other Ambulatory Visit: Payer: Self-pay | Admitting: Family Medicine

## 2019-06-30 DIAGNOSIS — E039 Hypothyroidism, unspecified: Secondary | ICD-10-CM

## 2019-06-30 DIAGNOSIS — G4733 Obstructive sleep apnea (adult) (pediatric): Secondary | ICD-10-CM | POA: Diagnosis not present

## 2019-07-03 ENCOUNTER — Ambulatory Visit
Admission: RE | Admit: 2019-07-03 | Discharge: 2019-07-03 | Disposition: A | Discharge status: Home / Self Care | Payer: HMO | Source: Ambulatory Visit | Attending: Gastroenterology | Admitting: Gastroenterology

## 2019-07-03 DIAGNOSIS — K7460 Unspecified cirrhosis of liver: Secondary | ICD-10-CM | POA: Diagnosis not present

## 2019-07-20 DIAGNOSIS — J449 Chronic obstructive pulmonary disease, unspecified: Secondary | ICD-10-CM | POA: Diagnosis not present

## 2019-07-24 ENCOUNTER — Ambulatory Visit: Payer: Self-pay | Admitting: *Deleted

## 2019-07-30 DIAGNOSIS — G4733 Obstructive sleep apnea (adult) (pediatric): Secondary | ICD-10-CM | POA: Diagnosis not present

## 2019-07-31 ENCOUNTER — Other Ambulatory Visit: Payer: Self-pay | Admitting: *Deleted

## 2019-07-31 ENCOUNTER — Encounter: Payer: Self-pay | Admitting: *Deleted

## 2019-07-31 DIAGNOSIS — R748 Abnormal levels of other serum enzymes: Secondary | ICD-10-CM | POA: Diagnosis not present

## 2019-07-31 DIAGNOSIS — K7469 Other cirrhosis of liver: Secondary | ICD-10-CM | POA: Diagnosis not present

## 2019-07-31 NOTE — Patient Outreach (Addendum)
[facility]) Care Management Chronic Special Needs Program  07/31/2019  Name: [name] DOB: 10-25-1933  MRN: [id number]  [name] is enrolled in a chronic special needs plan for Diabetes and Heart Failure. Reviewed and updated care plan.  Subjective: Spoke with client ant then at the request of [name], her daughter [name]. Follow up assessment completed. [name] and [name] are aware of the increase in the Hgb A1C to 10.6%  on 10/30. [name] is also reporting some occasional low blood sugars, some at night. Discussed strategies to improve blood sugar control and both are interested in the [facility] flash glucose monitoring system,. [name] says her brother wears the system and he "loves it'. [name] and [name] that Fre[employer] patient information brochure will be mailed to client's home address. Encouraged them to make appointment with [name] to discuss.  [name] says client still weighs daily but is having trouble seeing the scale reading so [name] is investigating getting a talking scale for her Mom. [name] says they cannot afford hearing aides for client. [name] is also aware of client's elevated lipid profile and likely need for intensification of therapy, but she states client is having speciality blood work drawn today to determine if her liver ascites is genetic. [name] says her Mom is being followed closely by [name] , the gastroenterologist taking [name] place as he is retiring at the end of 2020.   [name] says she arranged Meals on Wheels for her Mom.   Goals Addressed            This Visit's Progress     Patient Stated   . "write a new advance directive" (pt-stated)   Not on track     Other   .  Acknowledge receipt of Advanced Directive package   On track   . Advanced Care Planning complete by 2021   Not on track   . Client understands the importance of follow-up with providers by attending scheduled visits   Not  on track    Need follow up appointment with [name] as Hgb A1C is significantly higher at  10.6 % on 06/19/19 than on 10/28/18 when it was 7.7%    . Client will report abillity to obtain Medications within next 4-5 months   On track   . Client will report no worsening of symptoms of Atrial Fibrillation within the next 4-5 months   On track   . Client will report no worsening of symptoms related to heart disease within the next 4-5 months   On track   . Client will verbalize knowledge of chronic lung disease as evidenced by no ED visits or Inpatient stays related to chronic lung disease    On track   . Client will verbalize knowledge of self management of Hypertension as evidences by BP reading of 140/90 or less; or as defined by provider   On track    Blood pressure readings are meeting targets at provider office visits    . Decrease inpatient diabetes admissions/readmissions with in the year      . HEMOGLOBIN A1C < 7.0       Hgb A1C significantly higher so discussed the option of trying the [employer]re flash glucose monitoring system with client and at client's request, her daughter [name] patient information brochure to client's home address and encouraged [name] to make a follow up appointment for client with [name] ASAP to discuss the  Freestyle Libre and strategies to reduce Hgb A1C    . Maintain timely refills of diabetic medication as prescribed within the year .   On track   . COMPLETED: Obtain annual  Lipid Profile, LDL-C   On track    Lipid profile completed on 06/19/19    . COMPLETED: Obtain Annual Eye (retinal)  Exam    On track    Diabetic eye exam completed 03/04/19    . Obtain Annual Foot Exam       Unable to find results of diabetic foot exam for 2020 in the electronic medical record or in [employer] ([employer]- point of care tool)    . COMPLETED: Obtain annual screen for micro albuminuria (urine) , nephropathy (kidney problems)   On track     Urine tested for protein on 08/26/18    . COMPLETED: Obtain Hemoglobin A1C at least 2 times per year       Hgb A1C assessed on 08/26/18, 10/28/18 and 06/19/19    . Visit Primary Care Provider or Endocrinologist at least 2 times per year    Not on track    Needs follow up appointment with [name] ASAP as last visit was 10/28/18 and Hgb A1C is significantly higher at 10.6% on 06/19/19     Assessment: Client is not meeting diabetes self-management goal of hemoglobin A1C of <7% with most recent reading of 10.6% on 06/19/19 with occasional  reports of hypoglycemia . Blood pressure readings are meeting targets. Lipid profile demonstrates total cholesterol, LDL and triglycerides are elevated. Adjustments to therapy to be made by cardiologist once speciality blood work related to liver ascites is completed.  Heart failure is stable. Client received flu shot on 06/19/19 Client has good understanding of:  COVID-19 cause, symptoms, precautions (social distancing, stay at home order, hand washing, wear face covering when unable to maintain or ensure 6 foot social distancing), and symptoms requiring provider notification.  Plan:   Send successful outreach letter with a copy of their individualized updated care plan to client. Send individual up[dated care plan to provider  Send educational material on the [employer] flash glucose monitoring system to client Chronic care management coordinator will outreach in:  6 Months   Kelli Churn RN, CCM, [place] [facility] Coordinator [facility] [phone]

## 2019-08-16 ENCOUNTER — Encounter: Payer: Self-pay | Admitting: Family Medicine

## 2019-08-17 ENCOUNTER — Encounter: Payer: Self-pay | Admitting: Family Medicine

## 2019-08-17 MED ORDER — FREESTYLE LIBRE SENSOR SYSTEM MISC: 1 refills | Status: AC

## 2019-08-17 MED ORDER — FREESTYLE LIBRE READER DEVI: 1.0000 | Freq: Three times a day (TID) | 0 refills | Status: DC

## 2019-08-18 ENCOUNTER — Telehealth: Payer: Self-pay | Admitting: Family Medicine

## 2019-08-18 NOTE — Telephone Encounter (Signed)
Patient called, stating that she is having pain in the kidney area, more to the side of back *(right side)* , didn't see any appropriate slots to schedule with PDP. Please Advise, AM

## 2019-08-18 NOTE — Telephone Encounter (Signed)
If Dr. Jenetta Downer does not have an acute slot see if Valetta Fuller does please.

## 2019-08-18 NOTE — Telephone Encounter (Signed)
Appointment scheduled with Valetta Fuller tomorrow morning for a telehealth, patient agreed to come in and give a specimen before scheduled appointment. AM

## 2019-08-19 ENCOUNTER — Ambulatory Visit (INDEPENDENT_AMBULATORY_CARE_PROVIDER_SITE_OTHER): Payer: HMO | Admitting: Family Medicine

## 2019-08-19 ENCOUNTER — Other Ambulatory Visit: Payer: Self-pay

## 2019-08-19 ENCOUNTER — Encounter: Payer: Self-pay | Admitting: Family Medicine

## 2019-08-19 ENCOUNTER — Ambulatory Visit: Payer: HMO | Admitting: Adult Health

## 2019-08-19 DIAGNOSIS — B3731 Acute candidiasis of vulva and vagina: Secondary | ICD-10-CM

## 2019-08-19 DIAGNOSIS — M545 Low back pain, unspecified: Secondary | ICD-10-CM

## 2019-08-19 DIAGNOSIS — R81 Glycosuria: Secondary | ICD-10-CM

## 2019-08-19 DIAGNOSIS — R300 Dysuria: Secondary | ICD-10-CM

## 2019-08-19 DIAGNOSIS — B373 Candidiasis of vulva and vagina: Secondary | ICD-10-CM

## 2019-08-19 DIAGNOSIS — E1165 Type 2 diabetes mellitus with hyperglycemia: Secondary | ICD-10-CM | POA: Diagnosis not present

## 2019-08-19 DIAGNOSIS — L292 Pruritus vulvae: Secondary | ICD-10-CM

## 2019-08-19 LAB — POCT URINALYSIS DIPSTICK
Bilirubin, UA: NEGATIVE
Blood, UA: NEGATIVE
Glucose, UA: POSITIVE — AB
Ketones, UA: NEGATIVE
Leukocytes, UA: NEGATIVE
Nitrite, UA: NEGATIVE
Protein, UA: NEGATIVE
Spec Grav, UA: 1.02 (ref 1.010–1.025)
Urobilinogen, UA: 1 E.U./dL
pH, UA: 7 (ref 5.0–8.0)

## 2019-08-19 MED ORDER — FLUCONAZOLE 150 MG PO TABS: ORAL_TABLET | ORAL | 1 refills | Status: DC

## 2019-08-19 MED ORDER — MICONAZOLE 3 4 % VA CREA: TOPICAL_CREAM | VAGINAL | 0 refills | Status: DC

## 2019-08-19 NOTE — Progress Notes (Signed)
Telehealth office visit note for [name], D.O- at Primary Care at [facility]   I connected with current patient today and verified that I am speaking with the correct person using two identifiers.   . Location of the patient: Home . Location of the provider: Office Only the patient (+/- their family members at pt's discretion) and myself were participating in the encounter - This visit type was conducted due to national recommendations for restrictions regarding the COVID-19 Pandemic (e.g. social distancing) in an effort to limit this patient's exposure and mitigate transmission in our community.  This format is felt to be most appropriate for this patient at this time.   - The patient did not have access to video technology or had technical difficulties with video requiring transitioning to audio format only. - No physical exam could be performed with this format, beyond that communicated to [place] by the patient/ family members as noted.   - Additionally my office staff/ schedulers discussed with the patient that there may be a monetary charge related to this service, depending on their medical insurance.   The patient expressed understanding, and agreed to proceed.       History of Present Illness: Dysuria and Back Pain   I, [name], am serving as scribe for Dr. Mellody Hogan.  - Urinary Symptoms "Seems like I have a little more odor to my urine, and I guess that's about it."  Denies urinary frequency, denies change in urinary stream.  "I can go to the bathroom okay, I don't have any trouble going to the restroom."  Says it doesn't burn "not really that bad, I can't say it really burns, to be honest."  - Vaginal Itching Asks today if she can obtain diflucan.  Says she's had some itching and burning in her vaginal area, like she's treated with diflucan in the past.  Denies white / yeasty discharge, "I haven't seen anything like that."  - Right Back  Pain States experiencing pain in her right side, in the back.  Notes "it hurts."  She is feeling tightness, and sharp pains once in a while.    Notes if she is sitting and gets up to try to walk, "it hurts so bad I can hardly walk."  Says "sometimes it's very hard when I'm sitting on the couch, it hurts so bad I can hardly get off."  After she's been walking, confirms the pain eases off.  Her back has been hurting like this for about four days.  Says "I didn't have that many in at Christmas, and the daughters fixed the meal, so I haven't really done anything much."  - Glucosuria Home glucose readings:  Says her sugars are probably running a little high, but "I don't know."  Notes "the last time I checked them, I was 200."  Last A1C in the office was:  Lab Results  Component Value Date   HGBA1C 10.6 (H) 06/19/2019   HGBA1C 7.7 10/28/2018   HGBA1C 9.0 (H) 08/26/2018   Lab Results  Component Value Date   MICROALBUR 20 08/26/2018   [place] 220 (H) 06/19/2019   CREATININE 0.90 06/19/2019   BP Readings from Last 3 Encounters:  06/16/19 (!) 119/59  06/03/19 124/65  05/05/19 (!) 101/56   Wt Readings from Last 3 Encounters:  06/16/19 157 lb (71.2 kg)  06/03/19 157 lb 4.8 oz (71.4 kg)  05/05/19 160 lb 4.8 oz (72.7 kg)     GAD 7 :  Generalized Anxiety Score 12/31/2018  Nervous, Anxious, on Edge 0  Control/stop worrying 0  Worry too much - different things 0  Trouble relaxing 0  Restless 0  Easily annoyed or irritable 0  Afraid - awful might happen 0  Total GAD 7 Score 0  Anxiety Difficulty Not difficult at all    Depression screen [facility] 2/9 08/19/2019 05/05/2019 03/09/2019 12/31/2018 11/25/2018  Decreased Interest 0 3 0 0 0  Down, Depressed, Hopeless 0 0 0 0 0  PHQ - 2 Score 0 3 0 0 0  Altered sleeping 0 0 - 0 0  Tired, decreased energy 1 1 - 3 0  Change in appetite 0 0 - 0 0  Feeling bad or failure about yourself  0 1 - 0 0  Trouble concentrating 0 0 - 1 0  Moving slowly or  fidgety/restless 0 0 - 0 0  Suicidal thoughts 0 0 - 0 0  PHQ-9 Score 1 5 - 4 0  Difficult doing work/chores - Not difficult at all - Somewhat difficult Not difficult at all  Some recent data might be hidden      Impression and Recommendations:    1. Dysuria   2. Poorly controlled diabetes mellitus ([place])   3. Glucosuria   4. Acute right-sided low back pain without sciatica-  worse with initial mvmnt   5. h/o Yeast infection involving the vagina and surrounding area   6. Vulvovaginal pruritus      Dysuria - Urinalysis drawn today.   - Only abnormality on recent UA is glucosuria. - Urine sent for culture today.  - Extensive education provided to patient.  All questions answered.  - Will continue to monitor.   Vaginal Itching / Burning - Per patient, reports symptoms for which diflucan worked well in the past. - Patient knows to assess her vaginal area carefully for symptoms as advised. - Discussed symptoms of external vs internal yeast infection.  - Prescription diflucan provided today.  See med list. - Prescription cream sent.  See med list.   Glucosuria - Poorly Controlled Diabetes Mellitus - Discussed glucosuria with patient today.   - Education provided and all questions answered.  - Last A1c drawn two months ago 06/19/2019, poorly controlled, measuring at 10.6. - Patient sees endocrinologist [name] for control of blood sugar.  - Per patient, does not know when her next follow-up is with endocrinology. - Patient will call endocrinologist ASAP for follow up. - Discussed health risks associated with elevated A1c.  Told patient of critical need to get her blood sugars under control to avoid damage to kidneys.  - Will continue to monitor alongside endocrinology.   Acute Right-Sided Low Back Pain w/out Sciatica - Worse w/ Initial Movement - Discussed possible causes of patient's symptoms today. - Education provided regarding musculoskeletal pain.  All questions  answered.  - For relief, advised patient to apply moist heat to the area of symptoms.  - As patient's kidney health was stable last check, advised use of Advil/ibuprofen or alleve OTC as directed, for no more than 2-3 days.  - Patient may additionally incorporate Bengay or other OTC gels for muscle tightness.  - Encouraged patient to avoid being seated for long periods of time. - Prudent physical activity discussed with patient today.  - If back pain worsens, discussed need for additional in-person evaluation. - Patient will continue to monitor and call in if symptoms worsen or fail to improve as anticipated.   Recommendations - Return in 1-2  months for chronic care OV.   - As part of my medical decision making, I reviewed the following data within the [place] History obtained from pt /family, CMA notes reviewed and incorporated if applicable, Labs reviewed, Radiograph/ tests reviewed if applicable and OV notes from prior OV's with me, as well as other specialists she/he has seen since seeing me last, were all reviewed and used in my medical decision making process today.    - Additionally, discussion had with patient regarding our treatment plan, and their biases/concerns about that plan were used in my medical decision making today.    - The patient agreed with the plan and demonstrated an understanding of the instructions.   No barriers to understanding were identified.    - Red flag symptoms and signs discussed in detail.  Patient expressed understanding regarding what to do in case of emergency\ urgent symptoms.   - The patient was advised to call back or seek an in-person evaluation if the symptoms worsen or if the condition fails to improve as anticipated.   Return for f/up in 2 months for chronic care, sooner if back pain worsens or acute concerns arise.    Orders Placed This Encounter  Procedures  . Urine Culture  . POC Urinalysis dipstick    Meds  ordered this encounter  Medications  . fluconazole (DIFLUCAN) 150 MG tablet    Sig: 1 p.o. now, then repeat 1 po in 7 days    Dispense:  2 tablet    Refill:  1  . MICONAZOLE NITRATE VAGINAL (MICONAZOLE 3) 4 % CREA    Sig: Apply to perivaginal area nightly for 28days    Dispense:  25 g    Refill:  0    Medications Discontinued During This Encounter  Medication Reason  . azithromycin (ZITHROMAX) 250 MG tablet Error  . fluconazole (DIFLUCAN) 150 MG tablet Error      I provided 13+ minutes of non face-to-face time during this encounter.  Additional time was spent with charting and coordination of care before and after the actual visit commenced.   Note:  This note was prepared with assistance of Dragon voice recognition software. Occasional wrong-word or sound-a-like substitutions may have occurred due to the inherent limitations of voice recognition software.  This document serves as a record of services personally performed by Madison Dance, DO. It was created on her behalf by [name], a trained medical scribe. The creation of this record is based on the scribe's personal observations and the provider's statements to them.   This case required medical decision making of at least moderate complexity. The above documentation has been reviewed to be accurate and was completed by Marjory Sneddon, D.O.       Patient Care Team    Relationship Specialty Notifications Start End  Madison Dance, DO PCP - General Family Medicine  03/12/16   Nahser, Wonda Cheng, MD PCP - Cardiology Cardiology Admissions 10/28/18   Brand Males, MD Attending Physician Pulmonary Disease Abnormal results only, Admissions 12/07/12    Comment: severe COPD  Jacelyn Pi, MD Consulting Physician Endocrinology  05/22/17   Marice Potter, MD Consulting Physician Hematology and Oncology  10/16/18   Hayden Pedro, MD Consulting Physician Ophthalmology  11/25/18    Comment: DM retinop  specialist  Isaiah Serge, NP Nurse Practitioner Cardiology  12/31/18    Comment: works with dr. Sheppard Plumber, Trude Mcburney, [facility]  Admissions 01/06/19  Comment: Chronic Special Needs Plan Chronic Care Management Coordinator   Laurence Spates, MD Consulting Physician Gastroenterology  05/05/19    Comment: [name] ;  I believe     -Vitals obtained; medications/ allergies reconciled;  personal medical, social, Sx etc.histories were updated by CMA, reviewed by me and are reflected in chart   Patient Active Problem List   Diagnosis Date Noted  . Paroxysmal atrial fibrillation ([place]) 10/28/2018  . Liver cirrhosis secondary to NASH ([place]) 10/16/2018  . Pancytopenia ([place]) 09/26/2018  . Acute on chronic diastolic CHF (congestive heart failure) ([place]) 09/26/2018  . Microalbuminuria due to type 2 diabetes mellitus ([place]) 08/26/2018  . chronic Low platelet count 06/05/2017  . Hypertension associated with diabetes ([place]) 05/22/2017  . Morbidly obese ([place])- BMI >er 35 w DM 06/23/2016  . chronic Leukopenia 05/11/2016  . Poorly controlled diabetes mellitus ([place]) 03/13/2016  . Mixed diabetic hyperlipidemia associated with type 1 diabetes mellitus ([place]) 03/13/2016  . ILD (interstitial lung disease) ([place]) 11/25/2012  . Elevated serum alkaline phosphatase level- hepatocellular in origin 05/11/2016  . Depression 03/13/2016  . Hypothyroidism 03/13/2016  . Thrombocytopenia, unspecified ([place]) 12/13/2012  . Coronary artery disease 04/11/2011  . COPD (chronic obstructive pulmonary disease) ([place]) 04/11/2011  . Obstructive sleep apnea 04/11/2011  . Chronic combined systolic (congestive) and diastolic (congestive) heart failure ([place]) 03/29/2008  . Gastroesophageal reflux disease 12/31/2017  . Vitamin D deficiency 05/11/2016  . Constipation, chronic/  IBS syndrome 03/13/2016  . h/o Clostridium difficile colitis 12/12/2012  . History of GI bleed 12/12/2012  . Atherosclerosis  of carotid arteries 04/03/2006  . Environmental and seasonal allergies 06/03/2019  . Ascites of liver 05/05/2019  . Portal hypertension ([place]) 05/05/2019  . Decompensated hepatic cirrhosis ([place]) 04/17/2019  . Myocardial infarct, old 01/01/2019  . COPD exacerbation ([place]) 09/26/2018  . Acute on chronic respiratory failure with hypoxia ([place]) 09/26/2018  . PNA (pneumonia) 09/15/2018  . Hypocalcemia 03/12/2018  . At high risk for osteoporosis 03/13/2016  . Hypotension 12/13/2012  . Hypokalemia due to excessive renal loss of potassium 12/13/2012  . Leg edema 01/07/2012     Current Meds  Medication Sig  . albuterol (PROAIR HFA) 108 (90 Base) MCG/ACT inhaler Inhale 2 puffs into the lungs every 6 (six) hours as needed for wheezing or shortness of breath.  Marland Kitchen azelastine (ASTELIN) 0.1 % nasal spray 1 spray each nostril twice daily after sinus rinses  . Blood Glucose Monitoring Suppl (FREESTYLE LITE) DEVI Use to check blood sugars 2-3 times daily  . brimonidine (ALPHAGAN) 0.2 % ophthalmic solution Place 1 drop into both eyes daily.  . Carboxymeth-Glyc-Polysorb PF (REFRESH OPTIVE MEGA-3) 0.5-1-0.5 % SOLN Apply 1-2 drops to eye 2 (two) times daily.   . Cholecalciferol (VITAMIN D3) 50 MCG (2000 UT) TABS Take 1 tablet by mouth daily.  . Continuous Blood Gluc Receiver (FREESTYLE LIBRE READER) DEVI 1 each by Does not apply route 3 (three) times daily. Use to monitor blood sugars TID  . Continuous Blood Gluc Sensor ([place]) MISC Use to monitor blood sugars TID  . escitalopram (LEXAPRO) 20 MG tablet TAKE 1 TABLET (20 MG TOTAL) BY MOUTH DAILY.  . furosemide (LASIX) 40 MG tablet Take 1 to 2 tablets daily as directed.  . Insulin Aspart FlexPen 100 UNIT/ML SOPN INJECT 20 UNITS SUBCUTANEOUSLY THREE TIMES A DAY BEFORE MEALS; ADJUST AND INCREASE ACCORDINGLY.  . Insulin Glargine (LANTUS SOLOSTAR) 100 UNIT/ML Solostar Pen Inject 30 Units into the skin 2 (two) times daily. Patient uses sliding  scale for Lantus 20-60 units against medical advice  . Lancets (ONETOUCH DELICA PLUS [id number]) MISC USE TO CHECK BLOOD SUGAR THREE TIMES A DAY  . levocetirizine (XYZAL) 5 MG tablet Take 1 tablet (5 mg total) by mouth every evening. During allergy season only  . levothyroxine (SYNTHROID) 100 MCG tablet TAKE 1 TABLET BY MOUTH DAILY  . metoprolol tartrate (LOPRESSOR) 25 MG tablet Take 0.5 tablets (12.5 mg total) by mouth 2 (two) times daily.  . montelukast (SINGULAIR) 10 MG tablet TAKE 1 TABLET BY MOUTH EVERY EVENING.  . Multiple Vitamins-Minerals (VISION FORMULA/LUTEIN) TABS Take 1 tablet by mouth daily.  . nitroGLYCERIN (NITROSTAT) 0.4 MG SL tablet Place 1 tab under the tongue every 5 minutes as needed for chest pain.  Glory Rosebush ULTRA test strip TEST BLOOD SUGAR 2-3 TIMES A DAY  . pantoprazole (PROTONIX) 20 MG tablet TAKE ONE TABLET BY MOUTH TWICE DAILY  . Propylene Glycol (SYSTANE BALANCE) 0.6 % SOLN Apply 1 drop to eye 2 (two) times daily.  . RESTASIS 0.05 % ophthalmic emulsion Place 1 drop into both eyes 2 (two) times daily. 1 drop both eyes twice daily  . spironolactone (ALDACTONE) 100 MG tablet Take 1 tablet (100 mg total) by mouth daily.  Marland Kitchen UNIFINE PENTIPS 31G X 5 MM MISC USE TO INJECT INSULIN 5 TIMES A DAY  . White Petrolatum-Mineral Oil (GENTEAL TEARS NIGHT-TIME) OINT Apply 1 application to eye at bedtime.     Allergies:  Allergies  Allergen Reactions  . Ace Inhibitors Cough  . Benadryl [Diphenhydramine Hcl (Sleep)] Other (See Comments)    hypotension  . Fexofenadine Hcl Other (See Comments)  . Penicillins Hives and Swelling    Swelling of arms Has patient had a PCN reaction causing immediate rash, facial/tongue/throat swelling, SOB or lightheadedness with hypotension: unknown Has patient had a PCN reaction causing severe rash involving mucus membranes or skin necrosis: no Has patient had a PCN reaction that required hospitalization: unknown Has patient had a PCN reaction  occurring within the last 10 years: no, childhood allergy If all of the above answers are "NO", then may proceed with Cephalosporin use.   . Sulfonamide Derivatives Swelling     ROS:  See above HPI for pertinent positives and negatives   Objective:   There were no vitals taken for this visit.  (if some vitals are omitted, this means that patient was UNABLE to obtain them even though they were asked to get them prior to OV today.  They were asked to call us at their earliest convenience with these once obtained. )  General: A & O * 3; sounds in no acute distress; in usual state of health.  Skin: Pt confirms warm and dry extremities and pink fingertips HEENT: Pt confirms lips non-cyanotic Chest: Patient confirms normal chest excursion and movement Respiratory: speaking in full sentences, no conversational dyspnea; patient confirms no use of accessory muscles Psych: insight appears good, mood- appears full

## 2019-08-20 DIAGNOSIS — J449 Chronic obstructive pulmonary disease, unspecified: Secondary | ICD-10-CM | POA: Diagnosis not present

## 2019-08-21 LAB — URINE CULTURE

## 2019-08-25 ENCOUNTER — Other Ambulatory Visit: Payer: Self-pay | Admitting: Family Medicine

## 2019-08-25 DIAGNOSIS — K219 Gastro-esophageal reflux disease without esophagitis: Secondary | ICD-10-CM

## 2019-08-30 DIAGNOSIS — G4733 Obstructive sleep apnea (adult) (pediatric): Secondary | ICD-10-CM | POA: Diagnosis not present

## 2019-09-04 DIAGNOSIS — K7469 Other cirrhosis of liver: Secondary | ICD-10-CM | POA: Diagnosis not present

## 2019-09-08 ENCOUNTER — Other Ambulatory Visit: Payer: Self-pay | Admitting: Family Medicine

## 2019-09-20 DIAGNOSIS — J449 Chronic obstructive pulmonary disease, unspecified: Secondary | ICD-10-CM | POA: Diagnosis not present

## 2019-09-25 DIAGNOSIS — Z8669 Personal history of other diseases of the nervous system and sense organs: Secondary | ICD-10-CM | POA: Diagnosis not present

## 2019-09-25 DIAGNOSIS — K7469 Other cirrhosis of liver: Secondary | ICD-10-CM | POA: Diagnosis not present

## 2019-09-25 DIAGNOSIS — Z87898 Personal history of other specified conditions: Secondary | ICD-10-CM | POA: Diagnosis not present

## 2019-09-30 DIAGNOSIS — G4733 Obstructive sleep apnea (adult) (pediatric): Secondary | ICD-10-CM | POA: Diagnosis not present

## 2019-10-01 ENCOUNTER — Other Ambulatory Visit: Payer: Self-pay

## 2019-10-01 ENCOUNTER — Encounter (HOSPITAL_COMMUNITY): Payer: Self-pay | Admitting: Emergency Medicine

## 2019-10-01 ENCOUNTER — Emergency Department (HOSPITAL_COMMUNITY)
Admission: EM | Admit: 2019-10-01 | Discharge: 2019-10-01 | Disposition: A | Discharge status: Home / Self Care | Payer: HMO | Attending: Emergency Medicine | Admitting: Emergency Medicine

## 2019-10-01 ENCOUNTER — Emergency Department (HOSPITAL_COMMUNITY): Payer: HMO

## 2019-10-01 DIAGNOSIS — I5042 Chronic combined systolic (congestive) and diastolic (congestive) heart failure: Secondary | ICD-10-CM | POA: Diagnosis not present

## 2019-10-01 DIAGNOSIS — I2510 Atherosclerotic heart disease of native coronary artery without angina pectoris: Secondary | ICD-10-CM | POA: Diagnosis not present

## 2019-10-01 DIAGNOSIS — R188 Other ascites: Secondary | ICD-10-CM | POA: Diagnosis not present

## 2019-10-01 DIAGNOSIS — R609 Edema, unspecified: Secondary | ICD-10-CM | POA: Diagnosis present

## 2019-10-01 DIAGNOSIS — K7469 Other cirrhosis of liver: Secondary | ICD-10-CM | POA: Diagnosis not present

## 2019-10-01 DIAGNOSIS — R0602 Shortness of breath: Secondary | ICD-10-CM | POA: Insufficient documentation

## 2019-10-01 LAB — BASIC METABOLIC PANEL
Anion gap: 9 (ref 5–15)
BUN: 10 mg/dL (ref 8–23)
CO2: 24 mmol/L (ref 22–32)
Calcium: 8.3 mg/dL — ABNORMAL LOW (ref 8.9–10.3)
Chloride: 104 mmol/L (ref 98–111)
Creatinine, Ser: 1 mg/dL (ref 0.44–1.00)
GFR calc Af Amer: >60 mL/min (ref >60)
GFR calc non Af Amer: 52 mL/min — ABNORMAL LOW (ref >60)
Glucose, Bld: 230 mg/dL — ABNORMAL HIGH (ref 70–99)
Potassium: 4 mmol/L (ref 3.5–5.1)
Sodium: 137 mmol/L (ref 135–145)

## 2019-10-01 LAB — LIPASE, BLOOD: Lipase: 20 U/L (ref 11–51)

## 2019-10-01 LAB — CBC
HCT: 38.9 % (ref 36.0–46.0)
Hemoglobin: 12.6 g/dL (ref 12.0–15.0)
MCH: 31.3 pg (ref 26.0–34.0)
MCHC: 32.4 g/dL (ref 30.0–36.0)
MCV: 96.5 fL (ref 80.0–100.0)
Platelets: 56 10*3/uL — ABNORMAL LOW (ref 150–400)
RBC: 4.03 MIL/uL (ref 3.87–5.11)
RDW: 14.5 % (ref 11.5–15.5)
WBC: 3.7 10*3/uL — ABNORMAL LOW (ref 4.0–10.5)
nRBC: 0 % (ref 0.0–0.2)

## 2019-10-01 LAB — HEPATIC FUNCTION PANEL
ALT: 21 U/L (ref 0–44)
AST: 32 U/L (ref 15–41)
Albumin: 2.7 g/dL — ABNORMAL LOW (ref 3.5–5.0)
Alkaline Phosphatase: 167 U/L — ABNORMAL HIGH (ref 38–126)
Bilirubin, Direct: 0.4 mg/dL — ABNORMAL HIGH (ref 0.0–0.2)
Indirect Bilirubin: 1.1 mg/dL — ABNORMAL HIGH (ref 0.3–0.9)
Total Bilirubin: 1.5 mg/dL — ABNORMAL HIGH (ref 0.3–1.2)
Total Protein: 6.4 g/dL — ABNORMAL LOW (ref 6.5–8.1)

## 2019-10-01 LAB — BRAIN NATRIURETIC PEPTIDE: B Natriuretic Peptide: 234.6 pg/mL — ABNORMAL HIGH (ref 0.0–100.0)

## 2019-10-01 MED ORDER — FUROSEMIDE 40 MG PO TABS: 40.0000 mg | ORAL_TABLET | Freq: Two times a day (BID) | ORAL | 0 refills | Status: DC

## 2019-10-01 MED ORDER — FUROSEMIDE 10 MG/ML IJ SOLN
40.0000 mg | INTRAMUSCULAR | Status: AC
  Administered 2019-10-01: 40 mg via INTRAVENOUS
  Filled 2019-10-01: qty 4

## 2019-10-01 NOTE — ED Provider Notes (Signed)
[facility] Provider Note   CSN: [id number] Arrival date & time: 10/01/19  1230     History Chief Complaint  Patient presents with  . Fluid Retention  . Shortness of Breath    [name] is a 86 y.o. female.  HPI   86 year old female, she has a known history of steatohepatitis with cirrhosis of the liver with recurrent and ongoing swelling with intermittent episodes of fluid retention and abdominal distention.  She reports that over the last couple of weeks she has developed recurrent abdominal distention with approximately 20 pounds of weight gain, minimal swelling of the legs.  This is a recurrent problem, she was admitted to the hospital in August 2020 with the same.  She underwent an echocardiogram of the heart in February 2020 which showed according to my review of the medical record and that she had an ejection fraction around 40 to 45% with congestive heart failure as well.  The patient does endorse having a myocardial infarction in the 1990s.  She is taking Lasix as needed, this was recently increased and doubled in dose when she called her gastroenterologist asking what she should do next.  When she called the gastroenterologist today she was told to come to the emergency department.  The patient denies fevers, chills, nausea, vomiting, diarrhea, rashes, headache.  She has some epigastric pain associated with her abdominal distention.  She has had a prior cholecystectomy and a prior hysterectomy  Past Medical History:  Diagnosis Date  . Cirrhosis, non-alcoholic ([place])   . Coronary artery disease    status post inferior wall myocardial infarction  . Diabetes mellitus   . Dyslipidemia   . Hypotension 08/02/2010   recent episodes of orthostatic hypotension  . Hypothyroidism   . Leg edema   . Myocardial infarct, old   . Neuropathy of hand    left hand numbness/neuropathy  . Obesity   . Sleep apnea     Patient Active  Problem List   Diagnosis Date Noted  . Environmental and seasonal allergies 06/03/2019  . Ascites of liver 05/05/2019  . Portal hypertension ([place]) 05/05/2019  . Decompensated hepatic cirrhosis ([place]) 04/17/2019  . Myocardial infarct, old 01/01/2019  . Paroxysmal atrial fibrillation ([place]) 10/28/2018  . Liver cirrhosis secondary to NASH ([place]) 10/16/2018  . Pancytopenia ([place]) 09/26/2018  . COPD exacerbation ([place]) 09/26/2018  . Acute on chronic diastolic CHF (congestive heart failure) ([place]) 09/26/2018  . Acute on chronic respiratory failure with hypoxia ([place]) 09/26/2018  . PNA (pneumonia) 09/15/2018  . Microalbuminuria due to type 2 diabetes mellitus ([place]) 08/26/2018  . Hypocalcemia 03/12/2018  . Gastroesophageal reflux disease 12/31/2017  . chronic Low platelet count 06/05/2017  . Hypertension associated with diabetes ([place]) 05/22/2017  . Morbidly obese ([place])- BMI >er 35 w DM 06/23/2016  . chronic Leukopenia 05/11/2016  . Vitamin D deficiency 05/11/2016  . Elevated serum alkaline phosphatase level- hepatocellular in origin 05/11/2016  . Depression 03/13/2016  . Hypothyroidism 03/13/2016  . Poorly controlled diabetes mellitus ([place]) 03/13/2016  . Mixed diabetic hyperlipidemia associated with type 1 diabetes mellitus ([place]) 03/13/2016  . Constipation, chronic/  IBS syndrome 03/13/2016  . At high risk for osteoporosis 03/13/2016  . Hypotension 12/13/2012  . Hypokalemia due to excessive renal loss of potassium 12/13/2012  . Thrombocytopenia, unspecified ([place]) 12/13/2012  . h/o Clostridium difficile colitis 12/12/2012  . History of GI bleed 12/12/2012  . ILD (interstitial lung disease) ([place]) 11/25/2012  . Leg edema 01/07/2012  . Coronary  artery disease 04/11/2011  . COPD (chronic obstructive pulmonary disease) ([place]) 04/11/2011  . Obstructive sleep apnea 04/11/2011  . Chronic combined systolic (congestive) and diastolic (congestive) heart failure ([place]) 03/29/2008  . Atherosclerosis of  carotid arteries 04/03/2006    Past Surgical History:  Procedure Laterality Date  . ABDOMINAL HYSTERECTOMY    . CARDIAC CATHETERIZATION     The ejection fraction is around 50%  . CHOLECYSTECTOMY    . CORONARY STENT PLACEMENT    . ESOPHAGOGASTRODUODENOSCOPY N/A 12/12/2012   Procedure: ESOPHAGOGASTRODUODENOSCOPY (EGD);  Surgeon: [name], MD;  Location: [name];  Service: Endoscopy;  Laterality: N/A;  . FLEXIBLE SIGMOIDOSCOPY N/A 12/12/2012   Procedure: FLEXIBLE SIGMOIDOSCOPY;  Surgeon: [name], MD;  Location: [facility];  Service: Endoscopy;  Laterality: N/A;  . IR PARACENTESIS  09/29/2018  . IR PARACENTESIS  10/01/2018  . TUBAL LIGATION    . WRIST SURGERY       OB History   No obstetric history on file.     Family History  Problem Relation Age of Onset  . Emphysema Brother        non smoker  . Diabetes Brother   . Hyperlipidemia Brother   . Hypertension Brother   . Heart disease Father   . Heart attack Father   . Diabetes Father   . Hyperlipidemia Father   . Hypertension Father   . Diabetes Mother   . Diabetes Sister   . Hyperlipidemia Sister   . Hypertension Sister   . Heart attack Daughter   . Diabetes Daughter   . Diabetes Son   . Suicidality Son   . Alcohol abuse Son   . Alcohol abuse Maternal Uncle   . Heart attack Paternal Grandfather   . Diabetes Daughter   . Cancer Son        melanoma  . Diabetes Son   . Diabetes Son     Social History   Tobacco Use  . Smoking status: Former Smoker    Quit date: 08/20/1973    Years since quitting: 46.1  . Smokeless tobacco: Never Used  Substance Use Topics  . Alcohol use: No  . Drug use: No    Home Medications Prior to Admission medications   Medication Sig Start Date End Date Taking? Authorizing Provider  albuterol (VENTOLIN HFA) 108 (90 Base) MCG/ACT inhaler INHALE 2 PUFFS BY MOUTH EVERY 6 HOURS AS NEEDED FOR WHEEZING OR SHORTNESS OF BREATH 08/26/19   Opalski, [name], DO  azelastine  (ASTELIN) 0.1 % nasal spray 1 spray each nostril twice daily after sinus rinses 06/03/19   Opalski, Deborah, DO  Blood Glucose Monitoring Suppl (FREESTYLE LITE) DEVI Use to check blood sugars 2-3 times daily 01/07/17   Opalski, Deborah, DO  brimonidine (ALPHAGAN) 0.2 % ophthalmic solution Place 1 drop into both eyes daily. 03/04/19   [provider]  Carboxymeth-Glyc-Polysorb PF (REFRESH OPTIVE MEGA-3) 0.5-1-0.5 % SOLN Apply 1-2 drops to eye 2 (two) times daily.     [provider]  Cholecalciferol (VITAMIN D3) 50 MCG (2000 UT) TABS Take 1 tablet by mouth daily.    [provider]  Continuous Blood Gluc Receiver (FREESTYLE LIBRE 14 DAY READER) DEVI USE TO MONITOR BLOOD SUGAR 3 TIMES DAILY 09/08/19   Opalski, [name], DO  Continuous Blood Gluc Sensor ([place]) MISC Use to monitor blood sugars TID 08/17/19   Opalski, Deborah, DO  escitalopram (LEXAPRO) 20 MG tablet TAKE 1 TABLET (20 MG TOTAL) BY MOUTH DAILY. 05/06/19  Mellody Dance, DO  fluconazole (DIFLUCAN) 150 MG tablet 1 p.o. now, then repeat 1 po in 7 days 08/19/19   Mellody Dance, DO  furosemide (LASIX) 40 MG tablet Take 1 tablet (40 mg total) by mouth 2 (two) times daily. Take 1 to 2 tablets daily as directed. 10/01/19 10/31/19  Noemi Chapel, MD  Insulin Aspart FlexPen 100 UNIT/ML SOPN INJECT 20 UNITS SUBCUTANEOUSLY THREE TIMES A DAY BEFORE MEALS; ADJUST AND INCREASE ACCORDINGLY. 08/26/19   Opalski, [name], DO  Insulin Glargine (LANTUS SOLOSTAR) 100 UNIT/ML Solostar Pen Inject 30 Units into the skin 2 (two) times daily. Patient uses sliding scale for Lantus 20-60 units against medical advice    [provider]  Lancets (ONETOUCH DELICA PLUS [id number]) [place] A DAY 11/10/18   Opalski, [name], DO  levocetirizine (XYZAL) 5 MG tablet Take 1 tablet (5 mg total) by mouth every evening. During allergy season only 06/03/19   Mellody Dance, DO  levothyroxine  (SYNTHROID) 100 MCG tablet TAKE 1 TABLET BY MOUTH DAILY 07/01/19   Opalski, [name], DO  metoprolol tartrate (LOPRESSOR) 25 MG tablet Take 0.5 tablets (12.5 mg total) by mouth 2 (two) times daily. 03/30/19   Nahser, Wonda Cheng, MD  MICONAZOLE NITRATE VAGINAL (MICONAZOLE 3) 4 % CREA Apply to perivaginal area nightly for 28days 08/19/19   Opalski, Deborah, DO  montelukast (SINGULAIR) 10 MG tablet TAKE 1 TABLET BY MOUTH EVERY EVENING. 08/26/19   Opalski, Deborah, DO  Multiple Vitamins-Minerals (VISION FORMULA/LUTEIN) TABS Take 1 tablet by mouth daily.    [provider]  nitroGLYCERIN (NITROSTAT) 0.4 MG SL tablet Place 1 tab under the tongue every 5 minutes as needed for chest pain. 11/10/18   Nahser, Wonda Cheng, MD  [facility] ULTRA test strip TEST BLOOD SUGAR 2-3 TIMES A DAY 04/06/19   Opalski, [name], DO  pantoprazole (PROTONIX) 20 MG tablet TAKE ONE TABLET BY MOUTH TWICE DAILY 08/26/19   Opalski, [name], DO  Propylene Glycol (SYSTANE BALANCE) 0.6 % SOLN Apply 1 drop to eye 2 (two) times daily.    [provider]  RESTASIS 0.05 % ophthalmic emulsion Place 1 drop into both eyes 2 (two) times daily. 1 drop both eyes twice daily 12/31/18   [provider]  spironolactone (ALDACTONE) 100 MG tablet Take 1 tablet (100 mg total) by mouth daily. 04/19/19   Mercy Riding, MD  UNIFINE PENTIPS 31G X 5 MM MISC USE TO INJECT INSULIN 5 TIMES A DAY 07/01/19   Opalski, Deborah, DO  White Petrolatum-Mineral Oil (GENTEAL TEARS NIGHT-TIME) OINT Apply 1 application to eye at bedtime.    [provider]    Allergies    Ace inhibitors, Benadryl [diphenhydramine hcl (sleep)], Fexofenadine hcl, Penicillins, and Sulfonamide derivatives  Review of Systems   Review of Systems  All other systems reviewed and are negative.   Physical Exam Updated Vital Signs BP (!) 118/50 (BP Location: Right Arm)   Pulse 64   Temp 98.1 F (36.7 C) (Oral)   Resp 18   Ht 1.524 m (5')   Wt 84.8 kg   SpO2 98%    BMI 36.52 kg/m   Physical Exam Vitals and nursing note reviewed.  Constitutional:      General: She is not in acute distress.    Appearance: She is well-developed.  HENT:     Head: Normocephalic and atraumatic.     Mouth/Throat:     Pharynx: No oropharyngeal exudate.  Eyes:     General:  No scleral icterus.       Right eye: No discharge.        Left eye: No discharge.     Conjunctiva/sclera: Conjunctivae normal.     Pupils: Pupils are equal, round, and reactive to light.  Neck:     Thyroid: No thyromegaly.     Vascular: No JVD.  Cardiovascular:     Rate and Rhythm: Normal rate and regular rhythm.     Heart sounds: Normal heart sounds. No murmur. No friction rub. No gallop.   Pulmonary:     Effort: Pulmonary effort is normal. No respiratory distress.     Breath sounds: Normal breath sounds. No wheezing or rales.  Abdominal:     General: Bowel sounds are normal. There is distension ( diffuse without tympanitic sounds to percussion).     Palpations: Abdomen is soft. There is no mass.     Tenderness: There is abdominal tenderness ( epigastrium).  Musculoskeletal:        General: No tenderness. Normal range of motion.     Cervical back: Normal range of motion and neck supple.     Right lower leg: Edema present.     Left lower leg: Edema present.     Comments: Scant bilateral LE edema  Lymphadenopathy:     Cervical: No cervical adenopathy.  Skin:    General: Skin is warm and dry.     Findings: No erythema or rash.     Comments: Bruising across mid abd (insulin injection)  Neurological:     General: No focal deficit present.     Mental Status: She is alert.     Coordination: Coordination normal.  Psychiatric:        Behavior: Behavior normal.     ED Results / Procedures / Treatments   Labs (all labs ordered are listed, but only abnormal results are displayed) Labs Reviewed  BASIC METABOLIC PANEL - Abnormal; Notable for the following components:      Result Value    Glucose, Bld 230 (*)    Calcium 8.3 (*)    GFR calc non Af Amer 52 (*)    All other components within normal limits  CBC - Abnormal; Notable for the following components:   WBC 3.7 (*)    Platelets 56 (*)    All other components within normal limits  HEPATIC FUNCTION PANEL - Abnormal; Notable for the following components:   Total Protein 6.4 (*)    Albumin 2.7 (*)    Alkaline Phosphatase 167 (*)    Total Bilirubin 1.5 (*)    Bilirubin, Direct 0.4 (*)    Indirect Bilirubin 1.1 (*)    All other components within normal limits  BRAIN NATRIURETIC PEPTIDE - Abnormal; Notable for the following components:   B Natriuretic Peptide 234.6 (*)    All other components within normal limits  LIPASE, BLOOD    EKG EKG Interpretation  Date/Time:  Thursday October 01 2019 12:43:59 EST Ventricular Rate:  66 PR Interval:  202 QRS Duration: 94 QT Interval:  476 QTC Calculation: 499 R Axis:   96 Text Interpretation: Sinus rhythm with occasional Premature ventricular complexes Possible Lateral infarct , age undetermined Inferior infarct , age undetermined Abnormal ECG since last tracing no significant change Confirmed by Noemi Chapel [phone]) on 10/01/2019 1:03:34 PM   Radiology DG Chest 2 View  Result Date: 10/01/2019 CLINICAL DATA:  Shortness of breath. Weight change, again 20 pounds in the past 2 weeks. EXAM: CHEST - 2 VIEW  COMPARISON:  Most recent radiograph 04/17/2019. FINDINGS: Mild cardiomegaly, similar to prior exam allowing for differences in technique. Aortic atherosclerosis. Vascular congestion. No evidence of pulmonary edema or Kerley B-lines. No pleural fluid. No focal airspace disease or pneumothorax. Degenerative change in the spine. No acute osseous abnormalities are seen. IMPRESSION: Stable cardiomegaly with mild vascular congestion. Electronically Signed   By: Keith Rake M.D.   On: 10/01/2019 13:12    Procedures Procedures (including critical care time)  Medications  Ordered in ED Medications  furosemide (LASIX) injection 40 mg (40 mg Intravenous Given 10/01/19 1423)    ED Course  I have reviewed the triage vital signs and the nursing notes.  Pertinent labs & imaging results that were available during my care of the patient were reviewed by me and considered in my medical decision making (see chart for details).    MDM Rules/Calculators/A&P                      The patient will need to have evaluation for the cause of her weight gain and fluid retention, likely related to cirrhosis, she has no pulmonary edema clinically nor does she on her x-ray after I personally viewed and interpreted the x-ray there is not seem to be any infiltrates pleural effusions or obvious pulmonary edema.  Labs pending, bedside ultrasound to evaluate fluid burden.  The patient does not have a fluid wave and she is not peritoneal.  I have personally looked at the patients abdomen with a bedside US and there is only a small amount of ascites - this I not accounting for her weight gain, and is consistent with her exam of not being "tense" or having a fluid wave.  Labs are reassuring, the patient does have significant dysfunction with regard to her liver which is likely the cause of her fluid retention.  Her renal function is preserved with a creatinine of 1 and a BUN of 10, I will increase her Lasix to 40 mg twice daily for the next 10 days, follow-up for recheck in the office, she does not need an acute paracentesis, she is not hypoxic or even short of breath and has no significant pulmonary edema.  The patient is agreeable to the plan, she is aware of her results and stable for discharge  Final Clinical Impression(s) / ED Diagnoses Final diagnoses:  Other cirrhosis of liver ([place])    Rx / DC Orders ED Discharge Orders         Ordered    furosemide (LASIX) 40 MG tablet  2 times daily     10/01/19 1557           Noemi Chapel, MD 10/01/19 [phone]

## 2019-10-01 NOTE — ED Notes (Signed)
Pt daughter called, would like staff to know that she should be called for details about sx and med reconciliation

## 2019-10-01 NOTE — Discharge Instructions (Signed)
I Have ordered the tests to make sure that your heart is OK - and it looks good. Your tests show that your liver is what is causing you to retain fluids  This is treated with lasix -*(furosemide) which is the fluid pill - please take this twice daily for 10 days, then go back to once daily -  In 1 week I want you to see your doctor - you should call today to make this appointment. ER for worsening symptoms including increased swelling, shortness of breath or abdominal pain or distention.

## 2019-10-01 NOTE — ED Triage Notes (Signed)
Patient escorted by daughter stating patient has gained 20 pounds in the past 2 weeks. Over the past few days extra weight has been causing shortness of breath with exertion.

## 2019-10-01 NOTE — ED Notes (Signed)
Patient verbalizes understanding of discharge instructions. Opportunity for questioning and answers were provided. Armband removed by staff, pt discharged from ED to home with daughter via POV

## 2019-10-01 NOTE — ED Notes (Signed)
Toileting addressed after giving lasix - purewick applied

## 2019-10-02 ENCOUNTER — Other Ambulatory Visit: Payer: Self-pay | Admitting: Family Medicine

## 2019-10-07 DIAGNOSIS — R768 Other specified abnormal immunological findings in serum: Secondary | ICD-10-CM | POA: Diagnosis not present

## 2019-10-07 DIAGNOSIS — K7469 Other cirrhosis of liver: Secondary | ICD-10-CM | POA: Diagnosis not present

## 2019-10-13 ENCOUNTER — Other Ambulatory Visit: Payer: Self-pay | Admitting: Family Medicine

## 2019-10-14 ENCOUNTER — Encounter (HOSPITAL_COMMUNITY): Payer: Self-pay | Admitting: *Deleted

## 2019-10-14 ENCOUNTER — Emergency Department (HOSPITAL_COMMUNITY): Payer: HMO

## 2019-10-14 ENCOUNTER — Inpatient Hospital Stay (HOSPITAL_COMMUNITY)
Admission: EM | Admit: 2019-10-14 | Discharge: 2019-10-23 | DRG: 291 | Disposition: A | Discharge status: Home Health | Payer: HMO | Attending: Student | Admitting: Student

## 2019-10-14 ENCOUNTER — Other Ambulatory Visit: Payer: Self-pay

## 2019-10-14 DIAGNOSIS — I130 Hypertensive heart and chronic kidney disease with heart failure and stage 1 through stage 4 chronic kidney disease, or unspecified chronic kidney disease: Principal | ICD-10-CM | POA: Diagnosis present

## 2019-10-14 DIAGNOSIS — Z86718 Personal history of other venous thrombosis and embolism: Secondary | ICD-10-CM

## 2019-10-14 DIAGNOSIS — E1140 Type 2 diabetes mellitus with diabetic neuropathy, unspecified: Secondary | ICD-10-CM | POA: Diagnosis present

## 2019-10-14 DIAGNOSIS — Z955 Presence of coronary angioplasty implant and graft: Secondary | ICD-10-CM

## 2019-10-14 DIAGNOSIS — Z825 Family history of asthma and other chronic lower respiratory diseases: Secondary | ICD-10-CM

## 2019-10-14 DIAGNOSIS — E11649 Type 2 diabetes mellitus with hypoglycemia without coma: Secondary | ICD-10-CM | POA: Diagnosis not present

## 2019-10-14 DIAGNOSIS — T380X5A Adverse effect of glucocorticoids and synthetic analogues, initial encounter: Secondary | ICD-10-CM | POA: Diagnosis not present

## 2019-10-14 DIAGNOSIS — R188 Other ascites: Secondary | ICD-10-CM | POA: Diagnosis present

## 2019-10-14 DIAGNOSIS — Z833 Family history of diabetes mellitus: Secondary | ICD-10-CM

## 2019-10-14 DIAGNOSIS — D696 Thrombocytopenia, unspecified: Secondary | ICD-10-CM | POA: Diagnosis present

## 2019-10-14 DIAGNOSIS — I959 Hypotension, unspecified: Secondary | ICD-10-CM | POA: Diagnosis not present

## 2019-10-14 DIAGNOSIS — E872 Acidosis: Secondary | ICD-10-CM | POA: Diagnosis not present

## 2019-10-14 DIAGNOSIS — K7581 Nonalcoholic steatohepatitis (NASH): Secondary | ICD-10-CM

## 2019-10-14 DIAGNOSIS — Z8701 Personal history of pneumonia (recurrent): Secondary | ICD-10-CM

## 2019-10-14 DIAGNOSIS — Z882 Allergy status to sulfonamides status: Secondary | ICD-10-CM

## 2019-10-14 DIAGNOSIS — E669 Obesity, unspecified: Secondary | ICD-10-CM

## 2019-10-14 DIAGNOSIS — I5032 Chronic diastolic (congestive) heart failure: Secondary | ICD-10-CM | POA: Diagnosis not present

## 2019-10-14 DIAGNOSIS — R140 Abdominal distension (gaseous): Secondary | ICD-10-CM | POA: Diagnosis not present

## 2019-10-14 DIAGNOSIS — D72819 Decreased white blood cell count, unspecified: Secondary | ICD-10-CM

## 2019-10-14 DIAGNOSIS — R5381 Other malaise: Secondary | ICD-10-CM | POA: Diagnosis not present

## 2019-10-14 DIAGNOSIS — I2510 Atherosclerotic heart disease of native coronary artery without angina pectoris: Secondary | ICD-10-CM | POA: Diagnosis not present

## 2019-10-14 DIAGNOSIS — Z79899 Other long term (current) drug therapy: Secondary | ICD-10-CM

## 2019-10-14 DIAGNOSIS — J849 Interstitial pulmonary disease, unspecified: Secondary | ICD-10-CM | POA: Diagnosis not present

## 2019-10-14 DIAGNOSIS — Z888 Allergy status to other drugs, medicaments and biological substances status: Secondary | ICD-10-CM

## 2019-10-14 DIAGNOSIS — J441 Chronic obstructive pulmonary disease with (acute) exacerbation: Secondary | ICD-10-CM | POA: Diagnosis not present

## 2019-10-14 DIAGNOSIS — E875 Hyperkalemia: Secondary | ICD-10-CM | POA: Diagnosis not present

## 2019-10-14 DIAGNOSIS — Z880 Allergy status to penicillin: Secondary | ICD-10-CM

## 2019-10-14 DIAGNOSIS — E785 Hyperlipidemia, unspecified: Secondary | ICD-10-CM | POA: Diagnosis present

## 2019-10-14 DIAGNOSIS — R0602 Shortness of breath: Secondary | ICD-10-CM | POA: Diagnosis not present

## 2019-10-14 DIAGNOSIS — I4891 Unspecified atrial fibrillation: Secondary | ICD-10-CM

## 2019-10-14 DIAGNOSIS — D61818 Other pancytopenia: Secondary | ICD-10-CM | POA: Diagnosis not present

## 2019-10-14 DIAGNOSIS — Z79890 Hormone replacement therapy (postmenopausal): Secondary | ICD-10-CM

## 2019-10-14 DIAGNOSIS — G4733 Obstructive sleep apnea (adult) (pediatric): Secondary | ICD-10-CM | POA: Diagnosis present

## 2019-10-14 DIAGNOSIS — K7460 Unspecified cirrhosis of liver: Secondary | ICD-10-CM | POA: Diagnosis present

## 2019-10-14 DIAGNOSIS — K219 Gastro-esophageal reflux disease without esophagitis: Secondary | ICD-10-CM | POA: Diagnosis not present

## 2019-10-14 DIAGNOSIS — E039 Hypothyroidism, unspecified: Secondary | ICD-10-CM | POA: Diagnosis present

## 2019-10-14 DIAGNOSIS — Z8249 Family history of ischemic heart disease and other diseases of the circulatory system: Secondary | ICD-10-CM

## 2019-10-14 DIAGNOSIS — Z8349 Family history of other endocrine, nutritional and metabolic diseases: Secondary | ICD-10-CM

## 2019-10-14 DIAGNOSIS — I5043 Acute on chronic combined systolic (congestive) and diastolic (congestive) heart failure: Secondary | ICD-10-CM | POA: Diagnosis not present

## 2019-10-14 DIAGNOSIS — I5033 Acute on chronic diastolic (congestive) heart failure: Secondary | ICD-10-CM | POA: Diagnosis not present

## 2019-10-14 DIAGNOSIS — Z20822 Contact with and (suspected) exposure to covid-19: Secondary | ICD-10-CM | POA: Diagnosis present

## 2019-10-14 DIAGNOSIS — R0902 Hypoxemia: Secondary | ICD-10-CM

## 2019-10-14 DIAGNOSIS — E8809 Other disorders of plasma-protein metabolism, not elsewhere classified: Secondary | ICD-10-CM | POA: Diagnosis present

## 2019-10-14 DIAGNOSIS — N179 Acute kidney failure, unspecified: Secondary | ICD-10-CM | POA: Diagnosis not present

## 2019-10-14 DIAGNOSIS — I5042 Chronic combined systolic (congestive) and diastolic (congestive) heart failure: Secondary | ICD-10-CM

## 2019-10-14 DIAGNOSIS — E1122 Type 2 diabetes mellitus with diabetic chronic kidney disease: Secondary | ICD-10-CM | POA: Diagnosis present

## 2019-10-14 DIAGNOSIS — I480 Paroxysmal atrial fibrillation: Secondary | ICD-10-CM | POA: Diagnosis present

## 2019-10-14 DIAGNOSIS — K766 Portal hypertension: Secondary | ICD-10-CM | POA: Diagnosis present

## 2019-10-14 DIAGNOSIS — Z794 Long term (current) use of insulin: Secondary | ICD-10-CM

## 2019-10-14 DIAGNOSIS — Z6829 Body mass index (BMI) 29.0-29.9, adult: Secondary | ICD-10-CM

## 2019-10-14 DIAGNOSIS — N1831 Chronic kidney disease, stage 3a: Secondary | ICD-10-CM | POA: Diagnosis not present

## 2019-10-14 DIAGNOSIS — Z86711 Personal history of pulmonary embolism: Secondary | ICD-10-CM

## 2019-10-14 DIAGNOSIS — I4819 Other persistent atrial fibrillation: Secondary | ICD-10-CM | POA: Diagnosis present

## 2019-10-14 DIAGNOSIS — J449 Chronic obstructive pulmonary disease, unspecified: Secondary | ICD-10-CM | POA: Diagnosis not present

## 2019-10-14 DIAGNOSIS — Z87891 Personal history of nicotine dependence: Secondary | ICD-10-CM

## 2019-10-14 DIAGNOSIS — J9811 Atelectasis: Secondary | ICD-10-CM | POA: Diagnosis not present

## 2019-10-14 DIAGNOSIS — E1165 Type 2 diabetes mellitus with hyperglycemia: Secondary | ICD-10-CM | POA: Diagnosis not present

## 2019-10-14 DIAGNOSIS — I340 Nonrheumatic mitral (valve) insufficiency: Secondary | ICD-10-CM | POA: Diagnosis not present

## 2019-10-14 DIAGNOSIS — I252 Old myocardial infarction: Secondary | ICD-10-CM

## 2019-10-14 DIAGNOSIS — R062 Wheezing: Secondary | ICD-10-CM

## 2019-10-14 DIAGNOSIS — J9621 Acute and chronic respiratory failure with hypoxia: Secondary | ICD-10-CM | POA: Diagnosis present

## 2019-10-14 DIAGNOSIS — Z9981 Dependence on supplemental oxygen: Secondary | ICD-10-CM

## 2019-10-14 HISTORY — DX: Chronic obstructive pulmonary disease, unspecified: J44.9

## 2019-10-14 LAB — COMPREHENSIVE METABOLIC PANEL
ALT: 19 U/L (ref 0–44)
AST: 33 U/L (ref 15–41)
Albumin: 2.7 g/dL — ABNORMAL LOW (ref 3.5–5.0)
Alkaline Phosphatase: 159 U/L — ABNORMAL HIGH (ref 38–126)
Anion gap: 9 (ref 5–15)
BUN: 8 mg/dL (ref 8–23)
CO2: 26 mmol/L (ref 22–32)
Calcium: 8.3 mg/dL — ABNORMAL LOW (ref 8.9–10.3)
Chloride: 101 mmol/L (ref 98–111)
Creatinine, Ser: 0.92 mg/dL (ref 0.44–1.00)
GFR calc Af Amer: >60 mL/min (ref >60)
GFR calc non Af Amer: 58 mL/min — ABNORMAL LOW (ref >60)
Glucose, Bld: 285 mg/dL — ABNORMAL HIGH (ref 70–99)
Potassium: 3.7 mmol/L (ref 3.5–5.1)
Sodium: 136 mmol/L (ref 135–145)
Total Bilirubin: 1.1 mg/dL (ref 0.3–1.2)
Total Protein: 6.2 g/dL — ABNORMAL LOW (ref 6.5–8.1)

## 2019-10-14 LAB — CBC WITH DIFFERENTIAL/PLATELET
Abs Immature Granulocytes: 0.01 10*3/uL (ref 0.00–0.07)
Basophils Absolute: 0.1 10*3/uL (ref 0.0–0.1)
Basophils Relative: 1 %
Eosinophils Absolute: 0.2 10*3/uL (ref 0.0–0.5)
Eosinophils Relative: 5 %
HCT: 40.9 % (ref 36.0–46.0)
Hemoglobin: 13.2 g/dL (ref 12.0–15.0)
Immature Granulocytes: 0 %
Lymphocytes Relative: 16 %
Lymphs Abs: 0.6 10*3/uL — ABNORMAL LOW (ref 0.7–4.0)
MCH: 30.8 pg (ref 26.0–34.0)
MCHC: 32.3 g/dL (ref 30.0–36.0)
MCV: 95.3 fL (ref 80.0–100.0)
Monocytes Absolute: 0.8 10*3/uL (ref 0.1–1.0)
Monocytes Relative: 19 %
Neutro Abs: 2.3 10*3/uL (ref 1.7–7.7)
Neutrophils Relative %: 59 %
Platelets: 75 10*3/uL — ABNORMAL LOW (ref 150–400)
RBC: 4.29 MIL/uL (ref 3.87–5.11)
RDW: 14.1 % (ref 11.5–15.5)
WBC: 3.9 10*3/uL — ABNORMAL LOW (ref 4.0–10.5)
nRBC: 0 % (ref 0.0–0.2)

## 2019-10-14 LAB — TROPONIN I (HIGH SENSITIVITY)
Troponin I (High Sensitivity): 8 ng/L (ref <18)
Troponin I (High Sensitivity): 8 ng/L (ref <18)

## 2019-10-14 LAB — BRAIN NATRIURETIC PEPTIDE: B Natriuretic Peptide: 243.2 pg/mL — ABNORMAL HIGH (ref 0.0–100.0)

## 2019-10-14 MED ORDER — METHYLPREDNISOLONE SODIUM SUCC 125 MG IJ SOLR
125.0000 mg | Freq: Once | INTRAMUSCULAR | Status: AC
  Administered 2019-10-14: 125 mg via INTRAVENOUS
  Filled 2019-10-14: qty 2

## 2019-10-14 MED ORDER — ALBUTEROL SULFATE HFA 108 (90 BASE) MCG/ACT IN AERS
1.0000 | INHALATION_SPRAY | Freq: Once | RESPIRATORY_TRACT | Status: AC
  Administered 2019-10-14: 2 via RESPIRATORY_TRACT
  Filled 2019-10-14: qty 6.7

## 2019-10-14 MED ORDER — IOHEXOL 350 MG/ML SOLN
100.0000 mL | Freq: Once | INTRAVENOUS | Status: AC | PRN
  Administered 2019-10-14: 100 mL via INTRAVENOUS

## 2019-10-14 MED ORDER — SODIUM CHLORIDE 0.9 % IV BOLUS
500.0000 mL | Freq: Once | INTRAVENOUS | Status: AC
  Administered 2019-10-14: 500 mL via INTRAVENOUS

## 2019-10-14 NOTE — H&P (Signed)
[name] GXQ:[id number] DOB: [birth year] DOA: 10/14/2019     PCP: Mellody Dance, DO   Outpatient Specialists:   CARDS:  Dr. Katharina Caper   GI  Dr.Karki  [name]  )    Patient arrived to ER on 10/14/19 at 1905  Patient coming from: home Lives alone,       Chief Complaint:  Chief Complaint  Patient presents with  . Shortness of Breath    HPI: [name] is a 86 y.o. female with medical history significant of COPD on chronic oxygen, diabetes mellitus, cerebral cyst with ascites secondary to [facility], chronic combined CHF, pancytopenia,  sleep apnea     Presented with shortness of breath intermittent wheezing episode of short-lived chest pain and abdominal distention.  She states that she is putting her fluid in her belly again.  Daughter noted that her heart rate has been high and felt that she has atrial fibrillation.  Patient states she is on 2 L of oxygen and has been using it.  She states she has been taking all her medications.  She has not had any fevers no nausea no vomiting.  She has been increasing in her weight and feeling more swollen.  She also endorses some slight wheezing as well . patient was given a dose of 10 mg of IV Cardizem on the way to emergency department by EMS  She was seen earlier in the week in emergency department for approximately 20 pound weight gain.  Bedside ultrasound at that time showed very fluid little fluid.  Her Lasix was increased to 40 twice a day plan to recheck in the office.  She was able to be discharged to home  Patient was admitted back in August for fluid retention and ascites she was diuresed with Lasix and spironolactone no evidence of SBP was found she had a paracentesis done at the time [facility] suggestive of portal hypertension at that time   Infectious risk factors:  Reports  shortness of breath, dry cough, chest pain,      In  ER rapid COVID TEST  NEGATIVE   PCR pending  Lab Results  Component Value Date   [place]  Not Detected 06/04/2019   [place] NEGATIVE 04/17/2019     Regarding pertinent Chronic problems:    History of cirrhosis secondary to [place] requiring paracentesis in the past  HTN on metoprolol 25 twice daily Spironolactone 100 a day   chronic CHF diastolic/systolic/ combined - last echo February 2020 EF 40-45% Recently her Lasix was increased to 40 mg twice a day     DM 2 -  Lab Results  Component Value Date   HGBA1C 10.6 (H) 06/19/2019   on insulin, Lantus 40 units   Hypothyroidism:  Lab Results  Component Value Date   TSH 1.320 06/19/2019   on synthroid   Morbid obesity-   BMI Readings from Last 1 Encounters:  10/14/19 64.76 kg/m      COPD - not  followed by pulmonology  on baseline oxygen  2L,       A. Fib -  - CHA2DS2 vas score >3  Not on anticoagulation secondary to Risk of   recurrent bleeding         -  Rate control:  Currently controlled with   Metoprolol,           While in ER:  CTA negative for PE Noted to be pancytopenic white blood cell count 3.9 platelets 75  BNP at 243  unremarkable troponin Patient was treated for presumed COPD exacerbation with steroids and albuterol as well as some IV fluids for dehydration given tachycardia and hypotension initially  Her heart rate remained wildly fluctuating going from 140s to 90s.  Chest x-ray showed no fluid overload CTA negative for PE  The following Work up has been ordered so far:  Orders Placed This Encounter  Procedures  . DG Chest Portable 1 View  . CT Angio Chest PE W and/or Wo Contrast  . Comprehensive metabolic panel  . CBC with Differential  . Brain natriuretic peptide  . Check Pulse Oximetry while ambulating  . Consult to hospitalist  ALL PATIENTS BEING ADMITTED/HAVING PROCEDURES NEED COVID-19 SCREENING  . POC SARS Coronavirus 2 Ag-ED - Nasal Swab (BD Veritor Kit)  . EKG 12-Lead  . Saline lock IV     Following Medications were ordered in ER: Medications  methylPREDNISolone sodium  succinate (SOLU-MEDROL) 125 mg/2 mL injection 125 mg (has no administration in time range)  albuterol (VENTOLIN HFA) 108 (90 Base) MCG/ACT inhaler 1-2 puff (has no administration in time range)  sodium chloride 0.9 % bolus 500 mL (has no administration in time range)  iohexol (OMNIPAQUE) 350 MG/ML injection 100 mL (100 mLs Intravenous Contrast Given 10/14/19 2235)        Consult Orders  (From admission, onward)         Start     Ordered   10/14/19 2321  Consult to hospitalist  ALL PATIENTS BEING ADMITTED/HAVING PROCEDURES NEED COVID-19 SCREENING Paged [facility], roxanne  Once    Comments: ALL PATIENTS BEING ADMITTED/HAVING PROCEDURES NEED COVID-19 SCREENING  Provider:  (Not yet assigned)  Question Answer Comment  Place call to: [facility]   Reason for Consult Admit      10/14/19 2320          Significant initial  Findings: Abnormal Labs Reviewed  COMPREHENSIVE METABOLIC PANEL - Abnormal; Notable for the following components:      Result Value   Glucose, Bld 285 (*)    Calcium 8.3 (*)    Total Protein 6.2 (*)    Albumin 2.7 (*)    Alkaline Phosphatase 159 (*)    GFR calc non Af Amer 58 (*)    All other components within normal limits  CBC WITH DIFFERENTIAL/PLATELET - Abnormal; Notable for the following components:   WBC 3.9 (*)    Platelets 75 (*)    Lymphs Abs 0.6 (*)    All other components within normal limits  BRAIN NATRIURETIC PEPTIDE - Abnormal; Notable for the following components:   B Natriuretic Peptide 243.2 (*)    All other components within normal limits     Otherwise labs showing:     Recent Labs  Lab 10/14/19 1940  NA 136  K 3.7  CO2 26  GLUCOSE 285*  BUN 8  CREATININE 0.92  CALCIUM 8.3*    Cr    stable,   Lab Results  Component Value Date   CREATININE 0.92 10/14/2019   CREATININE 1.00 10/01/2019   CREATININE 0.90 06/19/2019    Recent Labs  Lab 10/14/19 1940  AST 33  ALT 19  ALKPHOS 159*  BILITOT 1.1  PROT 6.2*  ALBUMIN 2.7*    Lab Results  Component Value Date   CALCIUM 8.3 (L) 10/14/2019   PHOS 4.3 06/19/2019     WBC      Component Value Date/Time   WBC 3.9 (L) 10/14/2019 1940   ANC    Component Value  Date/Time   NEUTROABS 2.3 10/14/2019 1940   NEUTROABS 2.9 06/19/2019 0906   ALC No components found for: LYMPHAB    Plt: Lab Results  Component Value Date   PLT 75 (L) 10/14/2019   Lactic Acid, Venous   COVID-19 Labs  No results for input(s): DDIMER, FERRITIN, LDH, CRP in the last 72 hours.  Lab Results  Component Value Date   [place] Not Detected 06/04/2019   [place] NEGATIVE 04/17/2019      HG/HCT  stable,       Component Value Date/Time   HGB 13.2 10/14/2019 1940   HGB 15.8 06/19/2019 0906   HCT 40.9 10/14/2019 1940   HCT 45.6 06/19/2019 0906   Troponin 8     ECG: Ordered Personally reviewed by me showing: HR : 123  Rhythm:   A.fib. W PVC  no evidence of ischemic changes QTC 488   BNP (last 3 results) Recent Labs    04/17/19 1624 10/01/19 1258 10/14/19 1944  BNP 150.2* 234.6* 243.2*    ProBNP (last 3 results) No results for input(s): PROBNP in the last 8760 hours.  DM  labs:  HbA1C: Recent Labs    10/28/18 0000 06/19/19 0906  HGBA1C 7.7 10.6*       CBG (last 3)  No results for input(s): GLUCAP in the last 72 hours.     UA  ordered    CXR - cardiomegaly    CTA chest -   no PE,  no evidence of infiltrate      ED Triage Vitals  Enc Vitals Group     BP 10/14/19 1915 100/61     Pulse Rate 10/14/19 1915 86     Resp 10/14/19 1915 14     Temp 10/14/19 1915 98.3 F (36.8 C)     Temp Source 10/14/19 1915 Oral     SpO2 10/14/19 1915 99 %     Weight 10/14/19 1922 (!) 401 lb 3.8 oz (182 kg)     Height 10/14/19 1922 5' 6"  (1.676 m)     Head Circumference --      Peak Flow --      Pain Score 10/14/19 1922 7     Pain Loc --      Pain Edu? --      Excl. in [place]? --   TMAX(24)@       Latest  Blood pressure 107/67, pulse 88, temperature 98.3  F (36.8 C), temperature source Oral, resp. rate (!) 42, height 5' 6"  (1.676 m), weight (!) 182 kg, SpO2 99 %.     Hospitalist was called for admission for symptomatic ascites in the setting of known history of liver cirrhosis, worsening respiratory failure multifactorial, atrial fibrillation with RVR   Review of Systems:    Pertinent positives include:  Abdominal distention  shortness of breath at rest. non-productive cough, wheezing. Constitutional:  No weight loss, night sweats, Fevers, chills, fatigue, weight loss  HEENT:  No headaches, Difficulty swallowing,Tooth/dental problems,Sore throat,  No sneezing, itching, ear ache, nasal congestion, post nasal drip,  Cardio-vascular:  No chest pain, Orthopnea, PND, anasarca, dizziness, palpitations.no Bilateral lower extremity swelling  GI:  No heartburn, indigestion, pain, nausea, vomiting, diarrhea, change in bowel habits, loss of appetite, melena, blood in stool, hematemesis Resp:  noNo dyspnea on exertion, No excess mucus, no productive cough, No  No coughing up of blood.No change in color of mucus.No Skin:  no rash or lesions. No jaundice GU:  no dysuria, change in color of  urine, no urgency or frequency. No straining to urinate.  No flank pain.  Musculoskeletal:  No joint pain or no joint swelling. No decreased range of motion. No back pain.  Psych:  No change in mood or affect. No depression or anxiety. No memory loss.  Neuro: no localizing neurological complaints, no tingling, no weakness, no double vision, no gait abnormality, no slurred speech, no confusion  All systems reviewed and apart from Walker all are negative  Past Medical History:   Past Medical History:  Diagnosis Date  . Cirrhosis, non-alcoholic ([place])   . Coronary artery disease    status post inferior wall myocardial infarction  . Diabetes mellitus   . Dyslipidemia   . Hypotension 08/02/2010   recent episodes of orthostatic hypotension  . Hypothyroidism    . Leg edema   . Myocardial infarct, old   . Neuropathy of hand    left hand numbness/neuropathy  . Obesity   . Sleep apnea       Past Surgical History:  Procedure Laterality Date  . ABDOMINAL HYSTERECTOMY    . CARDIAC CATHETERIZATION     The ejection fraction is around 50%  . CHOLECYSTECTOMY    . CORONARY STENT PLACEMENT    . ESOPHAGOGASTRODUODENOSCOPY N/A 12/12/2012   Procedure: ESOPHAGOGASTRODUODENOSCOPY (EGD);  Surgeon: [name], MD;  Location: [name];  Service: Endoscopy;  Laterality: N/A;  . FLEXIBLE SIGMOIDOSCOPY N/A 12/12/2012   Procedure: FLEXIBLE SIGMOIDOSCOPY;  Surgeon: [name], MD;  Location: [facility] END[facility]COPY;  Service: Endoscopy;  Laterality: N/A;  . IR PARACENTESIS  09/29/2018  . IR PARACENTESIS  10/01/2018  . TUBAL LIGATION    . WRIST SURGERY      Social History:  Ambulatory , walker       reports that she quit smoking about 46 years ago. She has never used smokeless tobacco. She reports that she does not drink alcohol or use drugs.   Family History:   Family History  Problem Relation Age of Onset  . Emphysema Brother        non smoker  . Diabetes Brother   . Hyperlipidemia Brother   . Hypertension Brother   . Heart disease Father   . Heart attack Father   . Diabetes Father   . Hyperlipidemia Father   . Hypertension Father   . Diabetes Mother   . Diabetes Sister   . Hyperlipidemia Sister   . Hypertension Sister   . Heart attack Daughter   . Diabetes Daughter   . Diabetes Son   . Suicidality Son   . Alcohol abuse Son   . Alcohol abuse Maternal Uncle   . Heart attack Paternal Grandfather   . Diabetes Daughter   . Cancer Son        melanoma  . Diabetes Son   . Diabetes Son     Allergies: Allergies  Allergen Reactions  . Ace Inhibitors Cough  . Benadryl [Diphenhydramine Hcl (Sleep)] Other (See Comments)    hypotension  . Fexofenadine Hcl Other (See Comments)  . Penicillins Hives and Swelling    Swelling of arms Has  patient had a PCN reaction causing immediate rash, facial/tongue/throat swelling, SOB or lightheadedness with hypotension: unknown Has patient had a PCN reaction causing severe rash involving mucus membranes or skin necrosis: no Has patient had a PCN reaction that required hospitalization: unknown Has patient had a PCN reaction occurring within the last 10 years: no, childhood allergy If all of the above answers are "NO", then may  proceed with Cephalosporin use.   . Sulfonamide Derivatives Swelling     Prior to Admission medications   Medication Sig Start Date End Date Taking? Authorizing Provider  albuterol (VENTOLIN HFA) 108 (90 Base) MCG/ACT inhaler INHALE 2 PUFFS BY MOUTH EVERY 6 HOURS AS NEEDED FOR WHEEZING OR SHORTNESS OF BREATH Patient taking differently: Inhale 1 puff into the lungs every 6 (six) hours as needed for wheezing or shortness of breath.  08/26/19  Yes Opalski, Deborah, DO  brimonidine (ALPHAGAN) 0.2 % ophthalmic solution Place 1 drop into both eyes daily. 03/04/19  Yes [provider]  Carboxymeth-Glyc-Polysorb PF (REFRESH OPTIVE MEGA-3) 0.5-1-0.5 % SOLN Place 1 drop into both eyes 2 (two) times daily.    Yes [provider]  Cholecalciferol (VITAMIN D3) 50 MCG (2000 UT) TABS Take 1 tablet by mouth daily.   Yes [provider]  Continuous Blood Gluc Receiver (FREESTYLE LIBRE 14 DAY READER) DEVI USE TO MONITOR BLOOD SUGAR 3 TIMES DAILY 10/13/19  Yes Opalski, [name], DO  Continuous Blood Gluc Sensor ([place]) MISC Use to monitor blood sugars TID 08/17/19  Yes Opalski, Deborah, DO  escitalopram (LEXAPRO) 20 MG tablet TAKE 1 TABLET (20 MG TOTAL) BY MOUTH DAILY. 05/06/19  Yes Opalski, [name], DO  furosemide (LASIX) 40 MG tablet Take 1 tablet (40 mg total) by mouth 2 (two) times daily. Take 1 to 2 tablets daily as directed. 10/01/19 10/31/19 Yes Noemi Chapel, MD  Insulin Aspart FlexPen 100 UNIT/ML SOPN INJECT 20 UNITS SUBCUTANEOUSLY  THREE TIMES A DAY BEFORE MEALS; ADJUST AND INCREASE ACCORDINGLY. Patient taking differently: Inject 15 Units into the skin in the morning and at bedtime.  08/26/19  Yes Opalski, [name], DO  Insulin Glargine (LANTUS SOLOSTAR) 100 UNIT/ML Solostar Pen Inject 40 Units into the skin 2 (two) times daily. Patient uses sliding scale for Lantus 20-60 units against medical advice   Yes [provider]  Lancets (ONETOUCH DELICA PLUS [id number]) MISC USE TO CHECK BLOOD SUGAR THREE TIMES A DAY 11/10/18  Yes Opalski, [name], DO  levocetirizine (XYZAL) 5 MG tablet Take 1 tablet (5 mg total) by mouth every evening. During allergy season only 06/03/19  Yes Opalski, [name], DO  levothyroxine (SYNTHROID) 100 MCG tablet TAKE 1 TABLET BY MOUTH DAILY 07/01/19  Yes Opalski, [name], DO  metoprolol tartrate (LOPRESSOR) 25 MG tablet Take 0.5 tablets (12.5 mg total) by mouth 2 (two) times daily. 03/30/19  Yes Nahser, Wonda Cheng, MD  montelukast (SINGULAIR) 10 MG tablet TAKE 1 TABLET BY MOUTH EVERY EVENING. 08/26/19  Yes Opalski, Deborah, DO  Multiple Vitamins-Minerals (VISION FORMULA/LUTEIN) TABS Take 1 tablet by mouth daily.   Yes [provider]  nitroGLYCERIN (NITROSTAT) 0.4 MG SL tablet Place 1 tab under the tongue every 5 minutes as needed for chest pain. 11/10/18  Yes Nahser, Wonda Cheng, MD  pantoprazole (PROTONIX) 20 MG tablet TAKE ONE TABLET BY MOUTH TWICE DAILY Patient taking differently: Take 20 mg by mouth 2 (two) times daily.  08/26/19  Yes Opalski, [name], DO  Propylene Glycol (SYSTANE BALANCE) 0.6 % SOLN Place 1 drop into both eyes 2 (two) times daily.    Yes [provider]  RESTASIS 0.05 % ophthalmic emulsion Place 1 drop into both eyes 2 (two) times daily. 1 drop both eyes twice daily 12/31/18  Yes [provider]  spironolactone (ALDACTONE) 100 MG tablet Take 1 tablet (100 mg total) by mouth daily. 04/19/19  Yes Mercy Riding, MD  UNIFINE PENTIPS 31G X 5 MM  MISC USE TO INJECT INSULIN  5 TIMES A DAY 07/01/19  Yes Opalski, Deborah, DO  White Petrolatum-Mineral Oil (GENTEAL TEARS NIGHT-TIME) OINT Place 1 application into both eyes at bedtime.    Yes [provider]  azelastine (ASTELIN) 0.1 % nasal spray 1 spray each nostril twice daily after sinus rinses 06/03/19   Opalski, Deborah, DO  Blood Glucose Monitoring Suppl (FREESTYLE LITE) DEVI Use to check blood sugars 2-3 times daily 01/07/17   Mellody Dance, DO  fluconazole (DIFLUCAN) 150 MG tablet 1 p.o. now, then repeat 1 po in 7 days 08/19/19   Mellody Dance, DO  MICONAZOLE NITRATE VAGINAL (MICONAZOLE 3) 4 % CREA Apply to perivaginal area nightly for 28days Patient not taking: Reported on 10/14/2019 08/19/19   Mellody Dance, DO  ONETOUCH ULTRA test strip TEST BLOOD SUGAR 2-3 TIMES A DAY 04/06/19   Mellody Dance, DO   Physical Exam: Blood pressure 107/67, pulse 88, temperature 98.3 F (36.8 C), temperature source Oral, resp. rate (!) 42, height 5' 6"  (1.676 m), weight (!) 182 kg, SpO2 99 %. 1. General:  in No  Acute distress   Chronically ill  -appearing 2. Psychological: Alert and  Oriented 3. Head/ENT:   Moist  Mucous Membranes                          Head Non traumatic, neck supple                           Poor Dentition 4. SKIN: normal   Skin turgor,  Skin clean Dry and intact no rash, + palmar erythema 5. Heart: Regular rate and rhythm no  Murmur, no Rub or gallop 6. Lungs:occasional  wheezes or crackles   7. Abdomen: Soft,  non-tender,  distended    obese  bowel sounds present 8. Lower extremities: + clubbing, no cyanosis, trace edema 9. Neurologically Grossly intact, moving all 4 extremities equally  No asterexis 10. MSK: Normal range of motion   All other LABS:     Recent Labs  Lab 10/14/19 1940  WBC 3.9*  NEUTROABS 2.3  HGB 13.2  HCT 40.9  MCV 95.3  PLT 75*     Recent Labs  Lab 10/14/19 1940  NA 136  K 3.7  CL 101  CO2 26  GLUCOSE 285*  BUN 8  CREATININE 0.92  CALCIUM  8.3*     Recent Labs  Lab 10/14/19 1940  AST 33  ALT 19  ALKPHOS 159*  BILITOT 1.1  PROT 6.2*  ALBUMIN 2.7*       Cultures:    Component Value Date/Time   SDES PERITONEAL 04/18/2019 1007   SDES PERITONEAL 04/18/2019 1007   SPECREQUEST NONE 04/18/2019 1007   SPECREQUEST NONE 04/18/2019 1007   CULT  04/18/2019 1007    NO GROWTH 5 DAYS Performed at [facility], [place] [address], [place], [place] [id number]    REPTSTATUS 04/23/2019 FINAL 04/18/2019 1007   REPTSTATUS 04/19/2019 FINAL 04/18/2019 1007     Radiological Exams on Admission: CT Angio Chest PE W and/or Wo Contrast  Result Date: 10/14/2019 CLINICAL DATA:  Shortness of breath. History of sleep apnea and prior myocardial infarct. EXAM: CT ANGIOGRAPHY CHEST WITH CONTRAST TECHNIQUE: Multidetector CT imaging of the chest was performed using the standard protocol during bolus administration of intravenous contrast. Multiplanar CT image reconstructions and MIPs were obtained to evaluate the vascular anatomy. CONTRAST:  140m  OMNIPAQUE IOHEXOL 350 MG/ML SOLN COMPARISON:  10/13/2012. FINDINGS: Cardiovascular: Contrast injection is sufficient to demonstrate satisfactory opacification of the pulmonary arteries to the segmental level. There is no pulmonary embolus. There are atherosclerotic changes of the thoracic aorta without evidence for an aneurysm. The heart size is enlarged. Coronary artery calcifications are noted. There is no significant pericardial effusion. Mediastinum/Nodes: --again noted is mediastinal adenopathy. This is essentially stable since 2014. --No axillary lymphadenopathy. --No supraclavicular lymphadenopathy. --Normal thyroid gland. --The esophagus is unremarkable Lungs/Pleura: There is atelectasis at the lung bases. There is some mild bronchial wall thickening and mucus plugging at the lung bases bilaterally. There is no pneumothorax. No significant pleural effusion. The trachea is unremarkable. Again noted is  subpleural reticulation consistent with a mild chronic interstitial lung disease. Upper Abdomen: There is a large volume of ascites in the partially visualized upper abdomen. The liver is cirrhotic in appearance. The spleen is enlarged. Musculoskeletal: No chest wall abnormality. No acute or significant osseous findings. Review of the MIP images confirms the above findings. IMPRESSION: 1. No evidence for acute pulmonary embolism. 2. Mild bronchial wall thickening and mucus plugging at the lung bases bilaterally, which may reflect bronchitis. 3. Cardiomegaly with coronary artery calcifications. 4. Stable mediastinal adenopathy. 5. Cirrhotic appearance of the liver with evidence of portal hypertension including a moderate to large volume of abdominal ascites. 6. Aortic Atherosclerosis (ICD10-I70.0). Electronically Signed   By: Constance Holster M.D.   On: 10/14/2019 22:58   DG Chest Portable 1 View  Result Date: 10/14/2019 CLINICAL DATA:  Shortness of breath EXAM: PORTABLE CHEST 1 VIEW COMPARISON:  10/01/2019 FINDINGS: Elevation of the right diaphragm. Atelectasis at the right base. Mild cardiomegaly with aortic atherosclerosis. No consolidation, pleural effusion or pneumothorax. IMPRESSION: Cardiomegaly.  Elevated right diaphragm with basilar atelectasis. Electronically Signed   By: Donavan Foil M.D.   On: 10/14/2019 20:00    Chart has been reviewed   Assessment/Plan  86 y.o. female with medical history significant of COPD on chronic oxygen, diabetes mellitus, cerebral cyst with ascites secondary to [facility], chronic combined CHF, pancytopenia,  sleep apnea  Admitted for  symptomatic ascites in the setting of known history of liver cirrhosis, worsening respiratory failure multifactorial, atrial fibrillation with RVR, asictes and hypotension    Present on Admission: . Acute on chronic respiratory failure with hypoxia (HCC) -most likely multifactorial CTA negative for PE.  Patient may have interstitial  lung disease versus COPD with possible exacerbation other possibility include increased intra-abdominal pressure secondary to ascites causing worsening shortness of breath.  As well as overall fluid overload patient with history of combined systolic diastolic CHF. Given hypotension it is a difficult case is a difficult to diuresis patient in the setting of liver disease that has become decompensated.  Marland Kitchen COPD with acute exacerbation (HCC)-possibly some component of COPD exacerbation patient had some response to albuterol.  We will continue Steroid taper.  . Ascites of liver -order IR thoracentesis obtain labs.  Keep patient n.p.o.    . Acute on chronic combined systolic (congestive) and diastolic (congestive) heart failure ([place]) -difficult to diurese in the setting of hypotension the setting of cirrhosis.  Admit to stepdown.  At this point no evidence of pulmonary edema.  Fluid retention likely multifactorial secondary to hypoalbuminemia and.  Will administer albumin and if blood pressure allows we will try to diuresis.    will need cardiology consult to help manage   . Coronary artery disease - chronic stable continue statin troponin unremarkable chest  pain likely noncardiac   . Hypotension -likely related to cirrhosis.   . Thrombocytopenia, unspecified ([place]) -stable chronically secondary to cirrhosis patient not anticoagulation despite history of atrial fibrillation secondary to history of GI bleeds in the past   . Hypothyroidism - - Check TSH continue home medications at current dose  . chronic Leukopenia - chronic stable   . Morbidly obese (HCC)- BMI >er 35 w DM-  Complicating medical management will probably benefit from nutritional consult   . Gastroesophageal reflux disease -chronic stable continue Protonix     . Liver cirrhosis secondary to NASH ([place])-  followed by GI.  We will appreciate their consult in the morning Check INR Ammonia level Hold off on spironolactone Lasix for  tonight given hypotension resume when able to tolerate Give albumin   . Paroxysmal atrial fibrillation (HCC) patient reportedly has history of atrial fibrillation in the past heart rate wildly fluctuating.  Given hypotension and history of CHF would not be a good candidate for diltiazem If patient is at rest mainly heart rates below 110.  Continue to observe for tonight if persistently tachycardic may benefit from amiodarone Not a candidate for anticoagulation given thrombocytopenia and history of GI bleed   . Decompensated hepatic cirrhosis ([place]) -would benefit from GI consult in a.m.    Marland Kitchen Portal hypertension (HCC) in the setting of cirrhosis.   Other plan as per orders.  DVT prophylaxis:  SCD    Code Status:  FULL CODE as per patient  I had personally discussed CODE STATUS with patient   Overall complex patient with multiple medical problems and likely poor prognosis would need to discuss with family and patient possibility of introducing palliative care  Family Communication:   Family not at  Bedside   Disposition Plan:      To home once workup is complete and patient is stable                     Would benefit from PT/OT eval prior to DC  Ordered                                        Consults called: email cardiology, will need Gi consult in am given complexity      Admission status:  ED Disposition    ED Disposition Condition Comment   Admit  The patient appears reasonably stabilized for admission considering the current resources, flow, and capabilities available in the ED at this time, and I doubt any other [facility] requiring further screening and/or treatment in the ED prior to admission is  present.          inpatient     Expect 2 midnight stay secondary to severity of patient's current illness including   hemodynamic instability despite optimal treatment (tachycardia  hypotension   Hypoxia )  Severe lab/radiological/exam abnormalities including:    large volume  ascites and extensive comorbidities including:    DM2    CHF    COPD/asthma  Morbid Obesity       liver disease .    That are currently affecting medical management.   I expect  patient to be hospitalized for 2 midnights requiring inpatient medical care.  Patient is at high risk for adverse outcome (such as loss of life or disability) if not treated.  Indication for inpatient stay as follows:    Need for operative/procedural  intervention New or worsening hypoxia  Need for IV   albumin      Level of care       SDU tele indefinitely please discontinue once patient no longer qualifies   Precautions: admitted as asymptomatic screening protocol   PPE: Used by the provider:   P100  eye Goggles,  Gloves       Olean Sangster 10/14/2019, 2:02 AM    [facility]     after 2 AM please page floor coverage PA If 7AM-7PM, please contact the day team taking care of the patient using [email or web address]   Patient was evaluated in the context of the global COVID-19 pandemic, which necessitated consideration that the patient might be at risk for infection with the SARS-CoV-2 virus that causes COVID-19. Institutional protocols and algorithms that pertain to the evaluation of patients at risk for COVID-19 are in a state of rapid change based on information released by regulatory bodies including the CDC and federal and state organizations. These policies and algorithms were followed during the patient's care.

## 2019-10-14 NOTE — ED Notes (Signed)
The pts daughter has called to talk to her

## 2019-10-14 NOTE — ED Notes (Signed)
The pt is c/o epigastric pain

## 2019-10-14 NOTE — ED Triage Notes (Signed)
The pt arrived by gems from home pt c.o sob and a rapid heart rate  Alert and oriented skin warm and dry  Iv per ems  The pt was given cardizem 10 mg iv on the way here

## 2019-10-14 NOTE — ED Provider Notes (Signed)
[facility] EMERGENCY DEPARTMENT Provider Note   CSN: [id number] Arrival date & time: 10/14/19  1905     History Chief Complaint  Patient presents with  . Shortness of Breath    [name] is a 86 y.o. female past medical history of cirrhosis, CAD, diabetes, dyslipidemia, hypertension, MI, paroxysmal A. Fib who presents for evaluation of shortness of breath that has been ongoing for the last week or so.  She states that about 2 weeks ago, she noticed some mild shortness of breath.  She states it is worse with exertion.  She states she had some mild associated chest pain that she described as an occasional twinge in the middle of her chest.  She reports that over the last week, the shortness of breath got has gotten worse.  She had had a history of requiring oxygen had been able not to use it for about the last 2 years.  Last week when her symptom started getting worse, she had to start using the oxygen again.  Her daughter came and checked on her tonight and noticed that she was in A. fib, prompting ED visit.  Patient tells me she is not on any blood thinners.  She is unsure if she has been in A. fib before.  She follows with Dr. Katharina Caper (cardiology).  She states that she has had some mild cough that is not worse than her baseline.  She denies any fevers, abdominal pain, nausea/vomiting.  She states she has some intermittent swelling of her legs and states that she has come to the ED previously in the last couple weeks to get fluid drained. She denies any OCP use, recent immobilization, prior history of DVT/PE, recent surgery, leg swelling, or long travel.  The history is provided by the patient.       Past Medical History:  Diagnosis Date  . Cirrhosis, non-alcoholic ([place])   . Coronary artery disease    status post inferior wall myocardial infarction  . Diabetes mellitus   . Dyslipidemia   . Hypotension 08/02/2010   recent episodes of orthostatic hypotension  .  Hypothyroidism   . Leg edema   . Myocardial infarct, old   . Neuropathy of hand    left hand numbness/neuropathy  . Obesity   . Sleep apnea     Patient Active Problem List   Diagnosis Date Noted  . COPD with acute exacerbation ([place]) 10/14/2019  . Environmental and seasonal allergies 06/03/2019  . Ascites of liver 05/05/2019  . Portal hypertension ([place]) 05/05/2019  . Decompensated hepatic cirrhosis ([place]) 04/17/2019  . Myocardial infarct, old 01/01/2019  . Paroxysmal atrial fibrillation ([place]) 10/28/2018  . Liver cirrhosis secondary to NASH ([place]) 10/16/2018  . Pancytopenia ([place]) 09/26/2018  . COPD exacerbation ([place]) 09/26/2018  . Acute on chronic diastolic CHF (congestive heart failure) ([place]) 09/26/2018  . Acute on chronic respiratory failure with hypoxia ([place]) 09/26/2018  . PNA (pneumonia) 09/15/2018  . Microalbuminuria due to type 2 diabetes mellitus ([place]) 08/26/2018  . Hypocalcemia 03/12/2018  . Gastroesophageal reflux disease 12/31/2017  . chronic Low platelet count 06/05/2017  . Hypertension associated with diabetes ([place]) 05/22/2017  . Morbidly obese ([place])- BMI >er 35 w DM 06/23/2016  . chronic Leukopenia 05/11/2016  . Vitamin D deficiency 05/11/2016  . Elevated serum alkaline phosphatase level- hepatocellular in origin 05/11/2016  . Depression 03/13/2016  . Hypothyroidism 03/13/2016  . Poorly controlled diabetes mellitus ([place]) 03/13/2016  . Mixed diabetic hyperlipidemia associated with type 1 diabetes mellitus (  [place]) 03/13/2016  . Constipation, chronic/  IBS syndrome 03/13/2016  . At high risk for osteoporosis 03/13/2016  . Hypotension 12/13/2012  . Hypokalemia due to excessive renal loss of potassium 12/13/2012  . Thrombocytopenia, unspecified ([place]) 12/13/2012  . h/o Clostridium difficile colitis 12/12/2012  . History of GI bleed 12/12/2012  . ILD (interstitial lung disease) ([place]) 11/25/2012  . Leg edema 01/07/2012  . Coronary artery disease 04/11/2011  . COPD  (chronic obstructive pulmonary disease) ([place]) 04/11/2011  . Obstructive sleep apnea 04/11/2011  . Chronic combined systolic (congestive) and diastolic (congestive) heart failure ([place]) 03/29/2008  . Atherosclerosis of carotid arteries 04/03/2006    Past Surgical History:  Procedure Laterality Date  . ABDOMINAL HYSTERECTOMY    . CARDIAC CATHETERIZATION     The ejection fraction is around 50%  . CHOLECYSTECTOMY    . CORONARY STENT PLACEMENT    . ESOPHAGOGASTRODUODENOSCOPY N/A 12/12/2012   Procedure: ESOPHAGOGASTRODUODENOSCOPY (EGD);  Surgeon: [name], MD;  Location: [name];  Service: Endoscopy;  Laterality: N/A;  . FLEXIBLE SIGMOIDOSCOPY N/A 12/12/2012   Procedure: FLEXIBLE SIGMOIDOSCOPY;  Surgeon: [name], MD;  Location: [facility] ENDOSCOPY;  Service: Endoscopy;  Laterality: N/A;  . IR PARACENTESIS  09/29/2018  . IR PARACENTESIS  10/01/2018  . TUBAL LIGATION    . WRIST SURGERY       OB History   No obstetric history on file.     Family History  Problem Relation Age of Onset  . Emphysema Brother        non smoker  . Diabetes Brother   . Hyperlipidemia Brother   . Hypertension Brother   . Heart disease Father   . Heart attack Father   . Diabetes Father   . Hyperlipidemia Father   . Hypertension Father   . Diabetes Mother   . Diabetes Sister   . Hyperlipidemia Sister   . Hypertension Sister   . Heart attack Daughter   . Diabetes Daughter   . Diabetes Son   . Suicidality Son   . Alcohol abuse Son   . Alcohol abuse Maternal Uncle   . Heart attack Paternal Grandfather   . Diabetes Daughter   . Cancer Son        melanoma  . Diabetes Son   . Diabetes Son     Social History   Tobacco Use  . Smoking status: Former Smoker    Quit date: 08/20/1973    Years since quitting: 46.1  . Smokeless tobacco: Never Used  Substance Use Topics  . Alcohol use: No  . Drug use: No    Home Medications Prior to Admission medications   Medication Sig Start Date End  Date Taking? Authorizing Provider  albuterol (VENTOLIN HFA) 108 (90 Base) MCG/ACT inhaler INHALE 2 PUFFS BY MOUTH EVERY 6 HOURS AS NEEDED FOR WHEEZING OR SHORTNESS OF BREATH Patient taking differently: Inhale 1 puff into the lungs every 6 (six) hours as needed for wheezing or shortness of breath.  08/26/19  Yes Opalski, Deborah, DO  brimonidine (ALPHAGAN) 0.2 % ophthalmic solution Place 1 drop into both eyes daily. 03/04/19  Yes [provider]  Carboxymeth-Glyc-Polysorb PF (REFRESH OPTIVE MEGA-3) 0.5-1-0.5 % SOLN Place 1 drop into both eyes 2 (two) times daily.    Yes [provider]  Cholecalciferol (VITAMIN D3) 50 MCG (2000 UT) TABS Take 1 tablet by mouth daily.   Yes [provider]  Continuous Blood Gluc Receiver (FREESTYLE LIBRE 14 DAY READER) DEVI USE TO MONITOR BLOOD SUGAR  3 TIMES DAILY 10/13/19  Yes Opalski, [name], DO  Continuous Blood Gluc Sensor ([place]) MISC Use to monitor blood sugars TID 08/17/19  Yes Opalski, Deborah, DO  escitalopram (LEXAPRO) 20 MG tablet TAKE 1 TABLET (20 MG TOTAL) BY MOUTH DAILY. 05/06/19  Yes Opalski, [name], DO  furosemide (LASIX) 40 MG tablet Take 1 tablet (40 mg total) by mouth 2 (two) times daily. Take 1 to 2 tablets daily as directed. 10/01/19 10/31/19 Yes Noemi Chapel, MD  Insulin Aspart FlexPen 100 UNIT/ML SOPN INJECT 20 UNITS SUBCUTANEOUSLY THREE TIMES A DAY BEFORE MEALS; ADJUST AND INCREASE ACCORDINGLY. Patient taking differently: Inject 15 Units into the skin in the morning and at bedtime.  08/26/19  Yes Opalski, [name], DO  Insulin Glargine (LANTUS SOLOSTAR) 100 UNIT/ML Solostar Pen Inject 40 Units into the skin 2 (two) times daily. Patient uses sliding scale for Lantus 20-60 units against medical advice   Yes [provider]  Lancets (ONETOUCH DELICA PLUS [id number]) MISC USE TO CHECK BLOOD SUGAR THREE TIMES A DAY 11/10/18  Yes Opalski, [name], DO  levocetirizine (XYZAL) 5 MG tablet Take 1 tablet (5  mg total) by mouth every evening. During allergy season only 06/03/19  Yes Opalski, [name], DO  levothyroxine (SYNTHROID) 100 MCG tablet TAKE 1 TABLET BY MOUTH DAILY 07/01/19  Yes Opalski, [name], DO  metoprolol tartrate (LOPRESSOR) 25 MG tablet Take 0.5 tablets (12.5 mg total) by mouth 2 (two) times daily. 03/30/19  Yes Nahser, Wonda Cheng, MD  montelukast (SINGULAIR) 10 MG tablet TAKE 1 TABLET BY MOUTH EVERY EVENING. 08/26/19  Yes Opalski, Deborah, DO  Multiple Vitamins-Minerals (VISION FORMULA/LUTEIN) TABS Take 1 tablet by mouth daily.   Yes [provider]  nitroGLYCERIN (NITROSTAT) 0.4 MG SL tablet Place 1 tab under the tongue every 5 minutes as needed for chest pain. 11/10/18  Yes Nahser, Wonda Cheng, MD  pantoprazole (PROTONIX) 20 MG tablet TAKE ONE TABLET BY MOUTH TWICE DAILY Patient taking differently: Take 20 mg by mouth 2 (two) times daily.  08/26/19  Yes Opalski, [name], DO  Propylene Glycol (SYSTANE BALANCE) 0.6 % SOLN Place 1 drop into both eyes 2 (two) times daily.    Yes [provider]  RESTASIS 0.05 % ophthalmic emulsion Place 1 drop into both eyes 2 (two) times daily. 1 drop both eyes twice daily 12/31/18  Yes [provider]  spironolactone (ALDACTONE) 100 MG tablet Take 1 tablet (100 mg total) by mouth daily. 04/19/19  Yes Mercy Riding, MD  UNIFINE PENTIPS 31G X 5 MM MISC USE TO INJECT INSULIN 5 TIMES A DAY 07/01/19  Yes Opalski, Deborah, DO  White Petrolatum-Mineral Oil (GENTEAL TEARS NIGHT-TIME) OINT Place 1 application into both eyes at bedtime.    Yes [provider]  azelastine (ASTELIN) 0.1 % nasal spray 1 spray each nostril twice daily after sinus rinses 06/03/19   Opalski, Deborah, DO  Blood Glucose Monitoring Suppl (FREESTYLE LITE) DEVI Use to check blood sugars 2-3 times daily 01/07/17   Mellody Dance, DO  fluconazole (DIFLUCAN) 150 MG tablet 1 p.o. now, then repeat 1 po in 7 days 08/19/19   Mellody Dance, DO  MICONAZOLE NITRATE VAGINAL  (MICONAZOLE 3) 4 % CREA Apply to perivaginal area nightly for 28days Patient not taking: Reported on 10/14/2019 08/19/19   Mellody Dance, DO  ONETOUCH ULTRA test strip TEST BLOOD SUGAR 2-3 TIMES A DAY 04/06/19   Opalski, Deborah, DO    Allergies    Ace inhibitors, Benadryl [diphenhydramine hcl (sleep)], Fexofenadine  hcl, Penicillins, and Sulfonamide derivatives  Review of Systems   Review of Systems  Constitutional: Negative for fever.  Respiratory: Positive for shortness of breath. Negative for cough.   Cardiovascular: Positive for chest pain.  Gastrointestinal: Negative for abdominal pain, nausea and vomiting.  Genitourinary: Negative for dysuria and hematuria.  Neurological: Negative for weakness, numbness and headaches.  All other systems reviewed and are negative.   Physical Exam Updated Vital Signs BP 107/67   Pulse 88   Temp 98.3 F (36.8 C) (Oral)   Resp (!) 42   Ht 5' 6"  (1.676 m)   Wt (!) 182 kg   SpO2 99%   BMI 64.76 kg/m   Physical Exam Vitals and nursing note reviewed.  Constitutional:      Appearance: Normal appearance. She is well-developed.  HENT:     Head: Normocephalic and atraumatic.  Eyes:     General: Lids are normal.     Conjunctiva/sclera: Conjunctivae normal.     Pupils: Pupils are equal, round, and reactive to light.  Cardiovascular:     Rate and Rhythm: Normal rate. Rhythm irregularly irregular.     Pulses: Normal pulses.     Heart sounds: Normal heart sounds. No murmur. No friction rub. No gallop.   Pulmonary:     Effort: Pulmonary effort is normal.     Breath sounds: Rales present.     Comments: No evidence of respiratory distress. Crackles noted diffusely at bilateral bases. Abdominal:     Palpations: Abdomen is soft. Abdomen is not rigid.     Tenderness: There is no abdominal tenderness. There is no guarding.  Musculoskeletal:        General: Normal range of motion.     Cervical back: Full passive range of motion without pain.      Comments: Bilateral lower extremities without any diffuse edema.  Skin:    General: Skin is warm and dry.     Capillary Refill: Capillary refill takes less than 2 seconds.  Neurological:     Mental Status: She is alert and oriented to person, place, and time.  Psychiatric:        Speech: Speech normal.     ED Results / Procedures / Treatments   Labs (all labs ordered are listed, but only abnormal results are displayed) Labs Reviewed  COMPREHENSIVE METABOLIC PANEL - Abnormal; Notable for the following components:      Result Value   Glucose, Bld 285 (*)    Calcium 8.3 (*)    Total Protein 6.2 (*)    Albumin 2.7 (*)    Alkaline Phosphatase 159 (*)    GFR calc non Af Amer 58 (*)    All other components within normal limits  CBC WITH DIFFERENTIAL/PLATELET - Abnormal; Notable for the following components:   WBC 3.9 (*)    Platelets 75 (*)    Lymphs Abs 0.6 (*)    All other components within normal limits  BRAIN NATRIURETIC PEPTIDE - Abnormal; Notable for the following components:   B Natriuretic Peptide 243.2 (*)    All other components within normal limits  POC SARS CORONAVIRUS 2 AG -  ED  TROPONIN I (HIGH SENSITIVITY)  TROPONIN I (HIGH SENSITIVITY)    EKG EKG Interpretation  Date/Time:  Wednesday October 14 2019 19:11:49 EST Ventricular Rate:  123 PR Interval:    QRS Duration: 95 QT Interval:  341 QTC Calculation: 488 R Axis:   91 Text Interpretation: Atrial fibrillation Paired ventricular premature complexes Abnormal R-wave  progression, early transition Inferior infarct, old changed from prior Confirmed by Ripley Fraise ([id number]) on 10/14/2019 11:43:21 PM   Radiology CT Angio Chest PE W and/or Wo Contrast  Result Date: 10/14/2019 CLINICAL DATA:  Shortness of breath. History of sleep apnea and prior myocardial infarct. EXAM: CT ANGIOGRAPHY CHEST WITH CONTRAST TECHNIQUE: Multidetector CT imaging of the chest was performed using the standard protocol during bolus  administration of intravenous contrast. Multiplanar CT image reconstructions and MIPs were obtained to evaluate the vascular anatomy. CONTRAST:  125m OMNIPAQUE IOHEXOL 350 MG/ML SOLN COMPARISON:  10/13/2012. FINDINGS: Cardiovascular: Contrast injection is sufficient to demonstrate satisfactory opacification of the pulmonary arteries to the segmental level. There is no pulmonary embolus. There are atherosclerotic changes of the thoracic aorta without evidence for an aneurysm. The heart size is enlarged. Coronary artery calcifications are noted. There is no significant pericardial effusion. Mediastinum/Nodes: --again noted is mediastinal adenopathy. This is essentially stable since 2014. --No axillary lymphadenopathy. --No supraclavicular lymphadenopathy. --Normal thyroid gland. --The esophagus is unremarkable Lungs/Pleura: There is atelectasis at the lung bases. There is some mild bronchial wall thickening and mucus plugging at the lung bases bilaterally. There is no pneumothorax. No significant pleural effusion. The trachea is unremarkable. Again noted is subpleural reticulation consistent with a mild chronic interstitial lung disease. Upper Abdomen: There is a large volume of ascites in the partially visualized upper abdomen. The liver is cirrhotic in appearance. The spleen is enlarged. Musculoskeletal: No chest wall abnormality. No acute or significant osseous findings. Review of the MIP images confirms the above findings. IMPRESSION: 1. No evidence for acute pulmonary embolism. 2. Mild bronchial wall thickening and mucus plugging at the lung bases bilaterally, which may reflect bronchitis. 3. Cardiomegaly with coronary artery calcifications. 4. Stable mediastinal adenopathy. 5. Cirrhotic appearance of the liver with evidence of portal hypertension including a moderate to large volume of abdominal ascites. 6. Aortic Atherosclerosis (ICD10-I70.0). Electronically Signed   By: CConstance HolsterM.D.   On:  10/14/2019 22:58   DG Chest Portable 1 View  Result Date: 10/14/2019 CLINICAL DATA:  Shortness of breath EXAM: PORTABLE CHEST 1 VIEW COMPARISON:  10/01/2019 FINDINGS: Elevation of the right diaphragm. Atelectasis at the right base. Mild cardiomegaly with aortic atherosclerosis. No consolidation, pleural effusion or pneumothorax. IMPRESSION: Cardiomegaly.  Elevated right diaphragm with basilar atelectasis. Electronically Signed   By: KDonavan FoilM.D.   On: 10/14/2019 20:00    Procedures Procedures (including critical care time)  Medications Ordered in ED Medications  iohexol (OMNIPAQUE) 350 MG/ML injection 100 mL (100 mLs Intravenous Contrast Given 10/14/19 2235)  methylPREDNISolone sodium succinate (SOLU-MEDROL) 125 mg/2 mL injection 125 mg (125 mg Intravenous Given 10/14/19 2349)  albuterol (VENTOLIN HFA) 108 (90 Base) MCG/ACT inhaler 1-2 puff (2 puffs Inhalation Given 10/14/19 2350)  sodium chloride 0.9 % bolus 500 mL (500 mLs Intravenous New Bag/Given 10/14/19 2349)    ED Course  I have reviewed the triage vital signs and the nursing notes.  Pertinent labs & imaging results that were available during my care of the patient were reviewed by me and considered in my medical decision making (see chart for details).    MDM Rules/Calculators/A&P                      86year old female who presents for evaluation of shortness of breath x1 week. She states that she felt she is got more short of breath with exertion. She reportedly had to use some oxygen for a while but  over last 2 years, she has not had to use it. Over the last week, she is required oxygen again. Also reports some intermittent chest pain. Today, her daughter went to check on her and noticed she was in A. fib. Patient does not know if she has any history of being in A. fib. She is not on blood thinners. On initially arrival, she is afebrile.  She is intermittently tachycardic.  She is in A. fib persistently and her heart rate  fluctuates from 140s and will drop into the 100s.  She does not sustain A. fib with RVR but continues to go in and out of it.  Given her soft blood pressures, will hold off on any diltiazem.  She is on 2 L of O2 maintaining sats of 99. She does have crackles noted at bilateral bases. Concern for infectious etiology versus CHF exacerbation. Also consider PE which is driving the A. fib. Plan to check labs, imaging, CTA of chest.  I reviewed her record. She has been seen by cardiology (not sure). No definitive diagnosis of A. fib as it was questionable SVT versus A. fib. She is not on any blood thinners.  Lance Bosch is negative.  BNP is 243 which is her baseline.  CMP shows glucose of 285.  BUN and creatinine are within normal limits.  CBC shows leukopenia of 3.9.  Chest x-ray shows cardiomegaly.  No evidence of fluid overload.  CT shows no evidence of PE.  She has mild bronchial wall thickening and mucus mucus plugging noted at the bases bilaterally which could indicate bronchitis.  Cardiomegaly noted.  I personally ambulated patient in the ED.  She was short of breath, tachypneic with just even getting out of the bed and walking a few short steps.  On my second evaluation, she did appear to have some mild wheezing.  Her A. fib still fluctuates between heart rate of 120-130 and then will drop down to 100%.  Given concerns for hypoxia, oxygen requirement, feel that she needs to be admitted for further evaluation.  Discussed patient with Dr. Roel Cluck (hospitalist). She accepts patient for admission.   Portions of this note were generated with Lobbyist. Dictation errors may occur despite best attempts at proofreading.   Final Clinical Impression(s) / ED Diagnoses Final diagnoses:  SOB (shortness of breath)  Atrial fibrillation, unspecified type [facility])  Hypoxia    Rx / DC Orders ED Discharge Orders    None       Volanda Napoleon, PA-C 10/15/19 0015    Maudie Flakes,  MD 10/20/19 [phone]

## 2019-10-14 NOTE — ED Notes (Signed)
A pure wick was placed

## 2019-10-14 NOTE — ED Notes (Signed)
The pt reporting mild chest pain at present unable to tell me if if she has been in af in the past  She denies taking blood thinner medicine

## 2019-10-14 NOTE — ED Notes (Signed)
Pt asking for [pain med pa notified

## 2019-10-15 ENCOUNTER — Inpatient Hospital Stay (HOSPITAL_COMMUNITY): Payer: HMO

## 2019-10-15 ENCOUNTER — Other Ambulatory Visit: Payer: Self-pay

## 2019-10-15 ENCOUNTER — Encounter (HOSPITAL_COMMUNITY): Payer: Self-pay | Admitting: Internal Medicine

## 2019-10-15 DIAGNOSIS — I340 Nonrheumatic mitral (valve) insufficiency: Secondary | ICD-10-CM

## 2019-10-15 DIAGNOSIS — I4891 Unspecified atrial fibrillation: Secondary | ICD-10-CM

## 2019-10-15 LAB — CBC
HCT: 42 % (ref 36.0–46.0)
Hemoglobin: 13.4 g/dL (ref 12.0–15.0)
MCH: 30.3 pg (ref 26.0–34.0)
MCHC: 31.9 g/dL (ref 30.0–36.0)
MCV: 95 fL (ref 80.0–100.0)
Platelets: 59 10*3/uL — ABNORMAL LOW (ref 150–400)
RBC: 4.42 MIL/uL (ref 3.87–5.11)
RDW: 14.1 % (ref 11.5–15.5)
WBC: 2.8 10*3/uL — ABNORMAL LOW (ref 4.0–10.5)
nRBC: 0 % (ref 0.0–0.2)

## 2019-10-15 LAB — AMMONIA: Ammonia: 44 umol/L — ABNORMAL HIGH (ref 9–35)

## 2019-10-15 LAB — COMPREHENSIVE METABOLIC PANEL
ALT: 20 U/L (ref 0–44)
AST: 29 U/L (ref 15–41)
Albumin: 3.2 g/dL — ABNORMAL LOW (ref 3.5–5.0)
Alkaline Phosphatase: 154 U/L — ABNORMAL HIGH (ref 38–126)
Anion gap: 11 (ref 5–15)
BUN: 8 mg/dL (ref 8–23)
CO2: 25 mmol/L (ref 22–32)
Calcium: 8.7 mg/dL — ABNORMAL LOW (ref 8.9–10.3)
Chloride: 101 mmol/L (ref 98–111)
Creatinine, Ser: 0.9 mg/dL (ref 0.44–1.00)
GFR calc Af Amer: >60 mL/min (ref >60)
GFR calc non Af Amer: 59 mL/min — ABNORMAL LOW (ref >60)
Glucose, Bld: 174 mg/dL — ABNORMAL HIGH (ref 70–99)
Potassium: 4 mmol/L (ref 3.5–5.1)
Sodium: 137 mmol/L (ref 135–145)
Total Bilirubin: 1.4 mg/dL — ABNORMAL HIGH (ref 0.3–1.2)
Total Protein: 6.6 g/dL (ref 6.5–8.1)

## 2019-10-15 LAB — GLUCOSE, CAPILLARY
Glucose-Capillary: 149 mg/dL — ABNORMAL HIGH (ref 70–99)
Glucose-Capillary: 189 mg/dL — ABNORMAL HIGH (ref 70–99)
Glucose-Capillary: 319 mg/dL — ABNORMAL HIGH (ref 70–99)
Glucose-Capillary: 363 mg/dL — ABNORMAL HIGH (ref 70–99)
Glucose-Capillary: 380 mg/dL — ABNORMAL HIGH (ref 70–99)
Glucose-Capillary: 385 mg/dL — ABNORMAL HIGH (ref 70–99)

## 2019-10-15 LAB — TSH: TSH: 1.662 u[IU]/mL (ref 0.350–4.500)

## 2019-10-15 LAB — POC SARS CORONAVIRUS 2 AG -  ED: SARS Coronavirus 2 Ag: NEGATIVE

## 2019-10-15 LAB — HEMOGLOBIN A1C
Hgb A1c MFr Bld: 7.9 % — ABNORMAL HIGH (ref 4.8–5.6)
Mean Plasma Glucose: 180.03 mg/dL

## 2019-10-15 LAB — PHOSPHORUS: Phosphorus: 3.1 mg/dL (ref 2.5–4.6)

## 2019-10-15 LAB — ECHOCARDIOGRAM COMPLETE
Height: 66 in
Weight: 6419.8 oz

## 2019-10-15 LAB — PROTIME-INR
INR: 1.2 (ref 0.8–1.2)
Prothrombin Time: 14.7 seconds (ref 11.4–15.2)

## 2019-10-15 LAB — MAGNESIUM: Magnesium: 1.7 mg/dL (ref 1.7–2.4)

## 2019-10-15 MED ORDER — HYPROMELLOSE (GONIOSCOPIC) 2.5 % OP SOLN
1.0000 [drp] | Freq: Two times a day (BID) | OPHTHALMIC | Status: DC
  Administered 2019-10-15 – 2019-10-23 (×17): 1 [drp] via OPHTHALMIC
  Filled 2019-10-15 (×2): qty 15

## 2019-10-15 MED ORDER — ESCITALOPRAM OXALATE 20 MG PO TABS
20.0000 mg | ORAL_TABLET | Freq: Every day | ORAL | Status: DC
  Administered 2019-10-15 – 2019-10-23 (×9): 20 mg via ORAL
  Filled 2019-10-15 (×9): qty 1

## 2019-10-15 MED ORDER — LEVOTHYROXINE SODIUM 100 MCG PO TABS
100.0000 ug | ORAL_TABLET | Freq: Every day | ORAL | Status: DC
  Administered 2019-10-15 – 2019-10-23 (×9): 100 ug via ORAL
  Filled 2019-10-15 (×9): qty 1

## 2019-10-15 MED ORDER — PROPYLENE GLYCOL 0.6 % OP SOLN: 1.0000 [drp] | Freq: Two times a day (BID) | OPHTHALMIC | Status: DC

## 2019-10-15 MED ORDER — BUDESONIDE 0.5 MG/2ML IN SUSP
2.0000 mg | Freq: Two times a day (BID) | RESPIRATORY_TRACT | Status: DC
  Administered 2019-10-15: 2 mg via RESPIRATORY_TRACT
  Administered 2019-10-16: 0.5 mg via RESPIRATORY_TRACT
  Administered 2019-10-16: 2 mg via RESPIRATORY_TRACT
  Filled 2019-10-15 (×4): qty 8

## 2019-10-15 MED ORDER — SPIRONOLACTONE 25 MG PO TABS
100.0000 mg | ORAL_TABLET | Freq: Every day | ORAL | Status: DC
  Administered 2019-10-15 – 2019-10-16 (×2): 100 mg via ORAL
  Filled 2019-10-15 (×2): qty 4

## 2019-10-15 MED ORDER — ONDANSETRON HCL 4 MG PO TABS: 4.0000 mg | ORAL_TABLET | Freq: Four times a day (QID) | ORAL | Status: DC | PRN

## 2019-10-15 MED ORDER — GENTEAL TEARS NIGHT-TIME OP OINT: 1.0000 "application " | TOPICAL_OINTMENT | Freq: Every day | OPHTHALMIC | Status: DC

## 2019-10-15 MED ORDER — ONDANSETRON HCL 4 MG/2ML IJ SOLN: 4.0000 mg | Freq: Four times a day (QID) | INTRAMUSCULAR | Status: DC | PRN

## 2019-10-15 MED ORDER — HYDROCODONE-ACETAMINOPHEN 5-325 MG PO TABS
1.0000 | ORAL_TABLET | ORAL | Status: DC | PRN
  Administered 2019-10-16: 1 via ORAL
  Filled 2019-10-15: qty 1

## 2019-10-15 MED ORDER — IPRATROPIUM-ALBUTEROL 0.5-2.5 (3) MG/3ML IN SOLN
3.0000 mL | Freq: Four times a day (QID) | RESPIRATORY_TRACT | Status: DC
  Administered 2019-10-15: 3 mL via RESPIRATORY_TRACT
  Filled 2019-10-15 (×2): qty 3

## 2019-10-15 MED ORDER — SODIUM CHLORIDE 0.9% FLUSH
3.0000 mL | Freq: Two times a day (BID) | INTRAVENOUS | Status: DC
  Administered 2019-10-15 – 2019-10-23 (×17): 3 mL via INTRAVENOUS

## 2019-10-15 MED ORDER — BRIMONIDINE TARTRATE 0.2 % OP SOLN
1.0000 [drp] | Freq: Every day | OPHTHALMIC | Status: DC
  Administered 2019-10-15 – 2019-10-23 (×9): 1 [drp] via OPHTHALMIC
  Filled 2019-10-15: qty 5

## 2019-10-15 MED ORDER — ALBUTEROL SULFATE (2.5 MG/3ML) 0.083% IN NEBU: 2.5000 mg | INHALATION_SOLUTION | Freq: Four times a day (QID) | RESPIRATORY_TRACT | Status: DC | PRN

## 2019-10-15 MED ORDER — MOMETASONE FURO-FORMOTEROL FUM 200-5 MCG/ACT IN AERO
1.0000 | INHALATION_SPRAY | Freq: Two times a day (BID) | RESPIRATORY_TRACT | Status: DC
  Administered 2019-10-15 – 2019-10-23 (×17): 1 via RESPIRATORY_TRACT
  Filled 2019-10-15: qty 8.8

## 2019-10-15 MED ORDER — IPRATROPIUM-ALBUTEROL 0.5-2.5 (3) MG/3ML IN SOLN
RESPIRATORY_TRACT | Status: AC
  Filled 2019-10-15: qty 3

## 2019-10-15 MED ORDER — METOPROLOL TARTRATE 12.5 MG HALF TABLET
12.5000 mg | ORAL_TABLET | Freq: Two times a day (BID) | ORAL | Status: DC
  Administered 2019-10-15 – 2019-10-16 (×2): 12.5 mg via ORAL
  Filled 2019-10-15 (×3): qty 1

## 2019-10-15 MED ORDER — SODIUM CHLORIDE 0.9 % IV SOLN
500.0000 mg | INTRAVENOUS | Status: AC
  Administered 2019-10-15: 500 mg via INTRAVENOUS
  Filled 2019-10-15: qty 500

## 2019-10-15 MED ORDER — INSULIN GLARGINE 100 UNIT/ML ~~LOC~~ SOLN
40.0000 [IU] | Freq: Every day | SUBCUTANEOUS | Status: DC
  Filled 2019-10-15 (×2): qty 0.4

## 2019-10-15 MED ORDER — AZITHROMYCIN 250 MG PO TABS: 500.0000 mg | ORAL_TABLET | Freq: Every day | ORAL | Status: DC

## 2019-10-15 MED ORDER — DILTIAZEM HCL-DEXTROSE 125-5 MG/125ML-% IV SOLN (PREMIX)
5.0000 mg/h | INTRAVENOUS | Status: DC
  Administered 2019-10-15: 5 mg/h via INTRAVENOUS
  Administered 2019-10-16: 7.5 mg/h via INTRAVENOUS
  Filled 2019-10-15 (×2): qty 125

## 2019-10-15 MED ORDER — MONTELUKAST SODIUM 10 MG PO TABS
10.0000 mg | ORAL_TABLET | Freq: Every evening | ORAL | Status: DC
  Administered 2019-10-15 – 2019-10-22 (×8): 10 mg via ORAL
  Filled 2019-10-15 (×8): qty 1

## 2019-10-15 MED ORDER — FUROSEMIDE 10 MG/ML IJ SOLN
20.0000 mg | Freq: Two times a day (BID) | INTRAMUSCULAR | Status: DC
  Administered 2019-10-15: 20 mg via INTRAVENOUS
  Filled 2019-10-15: qty 2

## 2019-10-15 MED ORDER — CYCLOSPORINE 0.05 % OP EMUL
1.0000 [drp] | Freq: Two times a day (BID) | OPHTHALMIC | Status: DC
  Administered 2019-10-15 – 2019-10-23 (×17): 1 [drp] via OPHTHALMIC
  Filled 2019-10-15 (×18): qty 1

## 2019-10-15 MED ORDER — ACETAMINOPHEN 325 MG PO TABS
650.0000 mg | ORAL_TABLET | Freq: Four times a day (QID) | ORAL | Status: DC | PRN
  Administered 2019-10-15 – 2019-10-22 (×4): 650 mg via ORAL
  Filled 2019-10-15 (×4): qty 2

## 2019-10-15 MED ORDER — ACETAMINOPHEN 650 MG RE SUPP: 650.0000 mg | Freq: Four times a day (QID) | RECTAL | Status: DC | PRN

## 2019-10-15 MED ORDER — INSULIN ASPART 100 UNIT/ML ~~LOC~~ SOLN
0.0000 [IU] | SUBCUTANEOUS | Status: DC
  Administered 2019-10-15: 2 [IU] via SUBCUTANEOUS
  Administered 2019-10-15: 7 [IU] via SUBCUTANEOUS
  Administered 2019-10-15 (×2): 9 [IU] via SUBCUTANEOUS
  Administered 2019-10-15: 1 [IU] via SUBCUTANEOUS
  Administered 2019-10-16: 5 [IU] via SUBCUTANEOUS
  Administered 2019-10-16: 8 [IU] via SUBCUTANEOUS
  Administered 2019-10-16: 7 [IU] via SUBCUTANEOUS
  Administered 2019-10-16 (×2): 5 [IU] via SUBCUTANEOUS
  Administered 2019-10-16: 7 [IU] via SUBCUTANEOUS
  Administered 2019-10-17: 5 [IU] via SUBCUTANEOUS
  Administered 2019-10-17: 2 [IU] via SUBCUTANEOUS
  Administered 2019-10-17: 1 [IU] via SUBCUTANEOUS
  Administered 2019-10-17 (×3): 3 [IU] via SUBCUTANEOUS
  Administered 2019-10-18: 5 [IU] via SUBCUTANEOUS
  Administered 2019-10-18: 3 [IU] via SUBCUTANEOUS
  Administered 2019-10-18: 2 [IU] via SUBCUTANEOUS
  Administered 2019-10-18: 7 [IU] via SUBCUTANEOUS
  Administered 2019-10-19 (×2): 3 [IU] via SUBCUTANEOUS
  Administered 2019-10-19: 2 [IU] via SUBCUTANEOUS
  Administered 2019-10-19: 5 [IU] via SUBCUTANEOUS
  Administered 2019-10-19 (×2): 3 [IU] via SUBCUTANEOUS
  Administered 2019-10-19: 9 [IU] via SUBCUTANEOUS
  Administered 2019-10-20: 1 [IU] via SUBCUTANEOUS
  Administered 2019-10-20: 5 [IU] via SUBCUTANEOUS
  Administered 2019-10-20 (×3): 9 [IU] via SUBCUTANEOUS
  Administered 2019-10-21: 7 [IU] via SUBCUTANEOUS
  Administered 2019-10-21: 5 [IU] via SUBCUTANEOUS
  Administered 2019-10-21: 9 [IU] via SUBCUTANEOUS
  Administered 2019-10-21: 1 [IU] via SUBCUTANEOUS
  Administered 2019-10-21: 5 [IU] via SUBCUTANEOUS
  Administered 2019-10-22: 2 [IU] via SUBCUTANEOUS
  Administered 2019-10-22: 9 [IU] via SUBCUTANEOUS
  Administered 2019-10-22: 1 [IU] via SUBCUTANEOUS

## 2019-10-15 MED ORDER — FUROSEMIDE 10 MG/ML IJ SOLN
40.0000 mg | Freq: Two times a day (BID) | INTRAMUSCULAR | Status: DC
  Administered 2019-10-15: 40 mg via INTRAVENOUS
  Filled 2019-10-15 (×2): qty 4

## 2019-10-15 MED ORDER — BUDESONIDE 0.5 MG/2ML IN SUSP
2.0000 mg | Freq: Four times a day (QID) | RESPIRATORY_TRACT | Status: DC
  Administered 2019-10-15 (×2): 2 mg via RESPIRATORY_TRACT
  Filled 2019-10-15 (×2): qty 8

## 2019-10-15 MED ORDER — INSULIN GLARGINE 100 UNIT/ML ~~LOC~~ SOLN
20.0000 [IU] | Freq: Every day | SUBCUTANEOUS | Status: DC
  Administered 2019-10-15: 20 [IU] via SUBCUTANEOUS
  Filled 2019-10-15 (×2): qty 0.2

## 2019-10-15 MED ORDER — METOPROLOL TARTRATE 5 MG/5ML IV SOLN
5.0000 mg | INTRAVENOUS | Status: AC | PRN
  Administered 2019-10-15: 5 mg via INTRAVENOUS
  Filled 2019-10-15: qty 5

## 2019-10-15 MED ORDER — PANTOPRAZOLE SODIUM 20 MG PO TBEC
20.0000 mg | DELAYED_RELEASE_TABLET | Freq: Two times a day (BID) | ORAL | Status: DC
  Administered 2019-10-15 – 2019-10-23 (×17): 20 mg via ORAL
  Filled 2019-10-15 (×17): qty 1

## 2019-10-15 MED ORDER — SODIUM CHLORIDE 0.9 % IV SOLN: 250.0000 mL | INTRAVENOUS | Status: DC | PRN

## 2019-10-15 MED ORDER — SODIUM CHLORIDE 0.9% FLUSH: 3.0000 mL | INTRAVENOUS | Status: DC | PRN

## 2019-10-15 MED ORDER — ALBUMIN HUMAN 25 % IV SOLN
12.5000 g | Freq: Once | INTRAVENOUS | Status: AC
  Administered 2019-10-15: 12.5 g via INTRAVENOUS
  Filled 2019-10-15 (×2): qty 50

## 2019-10-15 MED ORDER — SODIUM CHLORIDE 0.9% FLUSH: 3.0000 mL | Freq: Two times a day (BID) | INTRAVENOUS | Status: DC

## 2019-10-15 MED ORDER — LEVALBUTEROL HCL 0.63 MG/3ML IN NEBU: 0.6300 mg | INHALATION_SOLUTION | Freq: Four times a day (QID) | RESPIRATORY_TRACT | Status: DC | PRN

## 2019-10-15 MED ORDER — PREDNISONE 20 MG PO TABS
40.0000 mg | ORAL_TABLET | Freq: Every day | ORAL | Status: DC
  Administered 2019-10-15: 40 mg via ORAL
  Filled 2019-10-15: qty 2

## 2019-10-15 NOTE — Evaluation (Signed)
Physical Therapy Evaluation Patient Details Name: [name] MRN: [id number] DOB: 03-19-1934 Today's Date: 10/15/2019   History of Present Illness  86 y.o. female admitted on 10/14/19 for SOBDx with hypoxic respiratory failure due to cardiogenic pulmonary edema, diastolic heart failure exacerbation complicated by A-fib with RVR.  COVID 19 (-).  Pt with significant PMH of MI, LE edema, DM, CAD, non-alcoholic cirrhosis.    Clinical Impression  Pt's HR is better, but remains in the 110s-120s during mobility.  O2 stable on 2 L O2 [name] (her baseline at home).  She is a bit wobbly on her feet, needing min guard assist for mobility around the room.  Pt would benefit from some follow up therapy if she d/c soon to ensure return to baseline.   PT to follow acutely for deficits listed below.      Follow Up Recommendations Home health PT;Supervision - Intermittent    Equipment Recommendations  None recommended by PT    Recommendations for Other Services   NA    Precautions / Restrictions Precautions Precautions: Fall;Other (comment) Precaution Comments: monitor HR (A-fib with RVR)      Mobility  Bed Mobility Overal bed mobility: Modified Independent                Transfers Overall transfer level: Needs assistance Equipment used: None Transfers: Sit to/from Stand Sit to Stand: Min guard         General transfer comment: Min guard assist for safety  Ambulation/Gait Ambulation/Gait assistance: Min guard Gait Distance (Feet): 20 Feet Assistive device: None Gait Pattern/deviations: Step-through pattern;Staggering right;Staggering left     General Gait Details: Pt with mildly staggering gait pattern, min guard assist for safety and balance.  HR 110s-120s during gait, used 2 L O2 [name]         Balance Overall balance assessment: Needs assistance Sitting-balance support: Feet supported;No upper extremity supported Sitting balance-Leahy Scale: Good     Standing balance  support: Bilateral upper extremity supported;Single extremity supported Standing balance-Leahy Scale: Fair                               Pertinent Vitals/Pain Pain Assessment: No/denies pain    Home Living Family/patient expects to be discharged to:: Private residence Living Arrangements: Alone Available Help at Discharge: Family;Available 24 hours/day(sons can stay with her if needed at d/c) Type of Home: House Home Access: Ramped entrance     Home Layout: One level Home Equipment: [name] - 4 wheels;Cane - quad;Cane - single point;Shower seat;Grab bars - tub/shower      Prior Function Level of Independence: Independent         Comments: drives short distances, but not at night, and reports she needs to get new glasses.  On 2 L O2 [name] at home at baseline.      Hand Dominance   Dominant Hand: Right    Extremity/Trunk Assessment   Upper Extremity Assessment Upper Extremity Assessment: Defer to OT evaluation    Lower Extremity Assessment Lower Extremity Assessment: Generalized weakness    Cervical / Trunk Assessment Cervical / Trunk Assessment: Normal  Communication   Communication: HOH  Cognition Arousal/Alertness: Awake/alert Behavior During Therapy: WFL for tasks assessed/performed Overall Cognitive Status: Within Functional Limits for tasks assessed  General Comments: HOH             Assessment/Plan    PT Assessment Patient needs continued PT services  PT Problem List Decreased strength;Decreased activity tolerance;Decreased balance;Decreased mobility;Decreased knowledge of use of DME;Cardiopulmonary status limiting activity       PT Treatment Interventions DME instruction;Gait training;Functional mobility training;Therapeutic activities;Therapeutic exercise;Balance training;Patient/family education    PT Goals (Current goals can be found in the Care Plan section)  Acute Rehab PT  Goals Patient Stated Goal: to get stronger and go home.  PT Goal Formulation: With patient Time For Goal Achievement: 10/29/19 Potential to Achieve Goals: Good    Frequency Min 3X/week           AM-PAC PT "6 Clicks" Mobility  Outcome Measure Help needed turning from your back to your side while in a flat bed without using bedrails?: None Help needed moving from lying on your back to sitting on the side of a flat bed without using bedrails?: None Help needed moving to and from a bed to a chair (including a wheelchair)?: A Little Help needed standing up from a chair using your arms (e.g., wheelchair or bedside chair)?: A Little Help needed to walk in hospital room?: A Little Help needed climbing 3-5 steps with a railing? : A Little 6 Click Score: 20    End of Session Equipment Utilized During Treatment: Oxygen Activity Tolerance: Patient tolerated treatment well Patient left: in chair;with call bell/phone within reach;with chair alarm set Nurse Communication: Mobility status PT Visit Diagnosis: Muscle weakness (generalized) (M62.81);Difficulty in walking, not elsewhere classified (R26.2)    Time: 0881-1031 PT Time Calculation (min) (ACUTE ONLY): 34 min   Charges:       Verdene Lennert, PT, DPT  Acute Rehabilitation [phone] pager #[phone] office  @ [facility]: [phone]   PT Evaluation $PT Eval Moderate Complexity: 1 Mod PT Treatments $Gait Training: 8-22 mins       10/15/2019, 3:43 PM

## 2019-10-15 NOTE — ED Notes (Signed)
Pt in ultrasound

## 2019-10-15 NOTE — Progress Notes (Signed)
Received to room 2W35 from ER via stretcher. Assisted to bed and positioned for comfort. In no acute distress. O2 @ 2L via nasal cannula. Purewick in place.

## 2019-10-15 NOTE — ED Notes (Signed)
Reported to DR.WICKLINE POC COVID WAS NEGETIVE 

## 2019-10-15 NOTE — Progress Notes (Signed)
Inpatient Diabetes Program Recommendations  AACE/ADA: New Consensus Statement on Inpatient Glycemic Control (2015)  Target Ranges:  Prepandial:   less than 140 mg/dL      Peak postprandial:   less than 180 mg/dL (1-2 hours)      Critically ill patients:  140 - 180 mg/dL   Lab Results  Component Value Date   GLUCAP 319 (H) 10/15/2019   HGBA1C 7.9 (H) 10/15/2019    Review of Glycemic Control  Diabetes history: DM2 Outpatient Diabetes medications: Lantus 40 units bid (patient varied 20-60 units based on CBGs) Current orders for Inpatient glycemic control: Lantus 20 units + Novolog sensitive correction q 4 hrs.  Inpatient Diabetes Program Recommendations:   -Increase Lantus to 20 units bid  Thank you, Nani Gasser. Davanee Klinkner, RN, MSN, CDE  Diabetes Coordinator Inpatient Glycemic Control Team Team Pager [phone] (8am-5pm) 10/15/2019 1:36 PM

## 2019-10-15 NOTE — Progress Notes (Signed)
Patient had a Limited US Abdomen this AM which showed a very small periheptic fluid collection.  Brought to IR for possible paracentesis.  Patient with only a trace amount of of LLQ fluid identified by [place]. No  collection of fluid which can be safely accessed at this time.  No procedure performed.   Patient returned to unit.   Brynda Greathouse, MS RD PA-C 11:31 AM

## 2019-10-15 NOTE — Consult Note (Addendum)
Cardiology Consultation:   Patient ID: [name] MRN: [id number]; DOB: [birth year]  Admit date: 10/14/2019 Date of Consult: 10/15/2019  Primary Care Provider: Mellody Dance, DO Primary Cardiologist: Madison Moores, Hogan  Primary Electrophysiologist:  None    Patient Profile:   [name] is a 86 y.o. female with a hx of CAD s/p DES RCA and later the of LAD, COPD/ ILD, chronic combined CHF, morbid obesity, DM2, SVT, nonalcoholic cirrhosis, OSA not on CPAP, hypothyroidism, Paroxysmal afib not on anticoagulation who is being seen today for the evaluation of Afib RVR and hypotension at the request of [name].  History of Present Illness:   [name] has been followed by [name] for several years. She has a history of CAD s/p inferior wall MI in 1990s treated with stent to RCA, later stent to LAD. Last cath was 2010 showing patent stents and otherwise nonobstructive disease. Also has h/o of COPD on home oxygen and ILD. Patient has chronic combined heart failure. Last echo 09/27/18 showed EF 29-92%, diastolic dysfunction, akinesis of inferior LV segment, normal RV function, mild thicking of mitral valve and aortic valve. The patient was hospitalized 08/2018 for COPD exacerbation complicated by massive ascites (requiring paracenteses and required albumin), anemia, thrombocytopenia, and developed Afib not started on anticoagulation. She was started on a BB for rate control. Amiodarone was not an option given h/o of cirrhosis. In her 2 follow-up visits she was in NSR. Her last tele-visit was 01/05/19 and reported intermittent chest pain. Isosorbide 30 mg was called in.  The patient went to the ED 10/01/19 for SOB and fluid retention. She had called the GI office regarding symptoms and was recommended to be seen in the ED. BNP 234. US abdomen showed minimal ascites and CXR was clear. She was discharged with lasix 40 BID for 10 days and told to follow up in the office.   The patient presented  to the ED  10/14/19 for shortness of breath. Stated symptoms started about 2 weeks prior and have been getting worse. SOB worse with exertion. Yesterday SOB became acutely worse and she felt some mild chest pain. Her daughter who is an MA checked her HR and thought she was in afib. Patient does not normally use O2 at home but was requiring supplemental O2 to help with breathing as well. Patient gets intermittent leg edema but not significantly worse. No fever or chills.   In the ED BP 107/67, pulse 88, afebrile, RR 24, 99% O2 2-3L O2. Labs showed potassium 3.7, glucose 285, creatinine 0.92, albumin 2.7. WBC 3.9, Hgb 13.2. BNP 243. HS troponin 8 > 8. Platelets 75. COVID negative. CXR with cardiomegaly. EKG showed Afib RVR rate 123 bpm. CTA chest showed no PE, possible bronchitis, moderate to large volume of abdominal ascites. She was given IV dilt 10 mg in the ED.   Heart Pathway Score:     Past Medical History:  Diagnosis Date  . Cirrhosis, non-alcoholic ([place])   . Coronary artery disease    status post inferior wall myocardial infarction  . Diabetes mellitus   . Dyslipidemia   . Hypotension 08/02/2010   recent episodes of orthostatic hypotension  . Hypothyroidism   . Leg edema   . Myocardial infarct, old   . Neuropathy of hand    left hand numbness/neuropathy  . Obesity   . Sleep apnea     Past Surgical History:  Procedure Laterality Date  . ABDOMINAL HYSTERECTOMY    . CARDIAC CATHETERIZATION  The ejection fraction is around 50%  . CHOLECYSTECTOMY    . CORONARY STENT PLACEMENT    . ESOPHAGOGASTRODUODENOSCOPY N/A 12/12/2012   Procedure: ESOPHAGOGASTRODUODENOSCOPY (EGD);  Surgeon: Madison Nipper, Hogan;  Location: [name];  Service: Endoscopy;  Laterality: N/A;  . FLEXIBLE SIGMOIDOSCOPY N/A 12/12/2012   Procedure: FLEXIBLE SIGMOIDOSCOPY;  Surgeon: Madison Nipper, Hogan;  Location: [facility];  Service: Endoscopy;  Laterality: N/A;  . IR PARACENTESIS  09/29/2018  . IR PARACENTESIS   10/01/2018  . TUBAL LIGATION    . WRIST SURGERY       Home Medications:  Prior to Admission medications   Medication Sig Start Date End Date Taking? Authorizing Provider  albuterol (VENTOLIN HFA) 108 (90 Base) MCG/ACT inhaler INHALE 2 PUFFS BY MOUTH EVERY 6 HOURS AS NEEDED FOR WHEEZING OR SHORTNESS OF BREATH Patient taking differently: Inhale 1 puff into the lungs every 6 (six) hours as needed for wheezing or shortness of breath.  08/26/19  Yes [name], Deborah, DO  brimonidine (ALPHAGAN) 0.2 % ophthalmic solution Place 1 drop into both eyes daily. 03/04/19  Yes [name]  Carboxymeth-Glyc-Polysorb PF (REFRESH OPTIVE MEGA-3) 0.5-1-0.5 % SOLN Place 1 drop into both eyes 2 (two) times daily.    Yes [name]  Cholecalciferol (VITAMIN D3) 50 MCG (2000 UT) TABS Take 1 tablet by mouth daily.   Yes [name]  Continuous Blood Gluc Receiver (FREESTYLE LIBRE 14 DAY READER) DEVI USE TO MONITOR BLOOD SUGAR 3 TIMES DAILY 10/13/19  Yes [name], Madison Laming, DO  Continuous Blood Gluc Sensor ([place]) MISC Use to monitor blood sugars TID 08/17/19  Yes [name], Deborah, DO  escitalopram (LEXAPRO) 20 MG tablet TAKE 1 TABLET (20 MG TOTAL) BY MOUTH DAILY. 05/06/19  Yes [name], Madison Laming, DO  furosemide (LASIX) 40 MG tablet Take 1 tablet (40 mg total) by mouth 2 (two) times daily. Take 1 to 2 tablets daily as directed. 10/01/19 10/31/19 Yes Madison Chapel, Hogan  Insulin Aspart FlexPen 100 UNIT/ML SOPN INJECT 20 UNITS SUBCUTANEOUSLY THREE TIMES A DAY BEFORE MEALS; ADJUST AND INCREASE ACCORDINGLY. Patient taking differently: Inject 15 Units into the skin in the morning and at bedtime.  08/26/19  Yes [name], Madison Laming, DO  Insulin Glargine (LANTUS SOLOSTAR) 100 UNIT/ML Solostar Pen Inject 40 Units into the skin 2 (two) times daily. Patient uses sliding scale for Lantus 20-60 units against medical advice   Yes [name]  Lancets (ONETOUCH DELICA PLUS  [id number]) MISC USE TO CHECK BLOOD SUGAR THREE TIMES A DAY 11/10/18  Yes [name], Madison Laming, DO  levocetirizine (XYZAL) 5 MG tablet Take 1 tablet (5 mg total) by mouth every evening. During allergy season only 06/03/19  Yes [name], Madison Laming, DO  levothyroxine (SYNTHROID) 100 MCG tablet TAKE 1 TABLET BY MOUTH DAILY 07/01/19  Yes [name], Madison Laming, DO  metoprolol tartrate (LOPRESSOR) 25 MG tablet Take 0.5 tablets (12.5 mg total) by mouth 2 (two) times daily. 03/30/19  Yes Nahser, Wonda Cheng, Hogan  montelukast (SINGULAIR) 10 MG tablet TAKE 1 TABLET BY MOUTH EVERY EVENING. 08/26/19  Yes [name], Deborah, DO  Multiple Vitamins-Minerals (VISION FORMULA/LUTEIN) TABS Take 1 tablet by mouth daily.   Yes [name]  nitroGLYCERIN (NITROSTAT) 0.4 MG SL tablet Place 1 tab under the tongue every 5 minutes as needed for chest pain. 11/10/18  Yes Nahser, Wonda Cheng, Hogan  pantoprazole (PROTONIX) 20 MG tablet TAKE ONE TABLET BY MOUTH TWICE DAILY Patient taking differently: Take 20 mg by mouth 2 (two) times daily.  08/26/19  Yes [name], Madison Laming, DO  Propylene Glycol (SYSTANE BALANCE) 0.6 % SOLN Place 1 drop into both eyes 2 (two) times daily.    Yes [name]  RESTASIS 0.05 % ophthalmic emulsion Place 1 drop into both eyes 2 (two) times daily. 1 drop both eyes twice daily 12/31/18  Yes [name]  spironolactone (ALDACTONE) 100 MG tablet Take 1 tablet (100 mg total) by mouth daily. 04/19/19  Yes Mercy Riding, Hogan  UNIFINE PENTIPS 31G X 5 MM MISC USE TO INJECT INSULIN 5 TIMES A DAY 07/01/19  Yes [name], Deborah, DO  White Petrolatum-Mineral Oil (GENTEAL TEARS NIGHT-TIME) OINT Place 1 application into both eyes at bedtime.    Yes [name]  azelastine (ASTELIN) 0.1 % nasal spray 1 spray each nostril twice daily after sinus rinses 06/03/19   [name], Deborah, DO  Blood Glucose Monitoring Suppl (FREESTYLE LITE) DEVI Use to check blood sugars 2-3 times daily 01/07/17   Madison Dance, DO  fluconazole (DIFLUCAN) 150 MG tablet 1 p.o. now, then repeat 1 po in 7 days 08/19/19   Madison Dance, DO  MICONAZOLE NITRATE VAGINAL (MICONAZOLE 3) 4 % CREA Apply to perivaginal area nightly for 28days Patient not taking: Reported on 10/14/2019 08/19/19   Madison Dance, DO  ONETOUCH ULTRA test strip TEST BLOOD SUGAR 2-3 TIMES A DAY 04/06/19   Madison Dance, DO    Inpatient Medications: Scheduled Meds: . brimonidine  1 drop Both Eyes Daily  . budesonide (PULMICORT) nebulizer solution  2 mg Nebulization BID  . cycloSPORINE  1 drop Both Eyes BID  . escitalopram  20 mg Oral Daily  . furosemide  40 mg Intravenous Q12H  . hydroxypropyl methylcellulose / hypromellose  1 drop Both Eyes BID  . insulin aspart  0-9 Units Subcutaneous Q4H  . insulin glargine  20 Units Subcutaneous QHS  . levothyroxine  100 mcg Oral Daily  . metoprolol tartrate  12.5 mg Oral BID  . mometasone-formoterol  1 puff Inhalation BID  . montelukast  10 mg Oral QPM  . pantoprazole  20 mg Oral BID  . sodium chloride flush  3 mL Intravenous Q12H  . spironolactone  100 mg Oral Daily   Continuous Infusions: . sodium chloride    . diltiazem (CARDIZEM) infusion 5 mg/hr (10/15/19 1032)   PRN Meds: sodium chloride, acetaminophen **OR** acetaminophen, albuterol, HYDROcodone-acetaminophen, levalbuterol, ondansetron **OR** ondansetron (ZOFRAN) IV, sodium chloride flush  Allergies:    Allergies  Allergen Reactions  . Ace Inhibitors Cough  . Benadryl [Diphenhydramine Hcl (Sleep)] Other (See Comments)    hypotension  . Fexofenadine Hcl Other (See Comments)  . Penicillins Hives and Swelling    Swelling of arms Has patient had a PCN reaction causing immediate rash, facial/tongue/throat swelling, SOB or lightheadedness with hypotension: unknown Has patient had a PCN reaction causing severe rash involving mucus membranes or skin necrosis: no Has patient had a PCN reaction that required hospitalization:  unknown Has patient had a PCN reaction occurring within the last 10 years: no, childhood allergy If all of the above answers are "NO", then may proceed with Cephalosporin use.   . Sulfonamide Derivatives Swelling    Social History:   Social History   Socioeconomic History  . Marital status: Widowed    Spouse name: Not on file  . Number of children: 2  . Years of education: Not on file  . Highest education level: Not on file  Occupational History  . Not on file  Tobacco  Use  . Smoking status: Former Smoker    Quit date: 08/20/1973    Years since quitting: 46.1  . Smokeless tobacco: Never Used  Substance and Sexual Activity  . Alcohol use: No  . Drug use: No  . Sexual activity: Never  Other Topics Concern  . Not on file  Social History Narrative   Lives alone in one story home   Daughter [name] lives in [place] and she works as  [employer] for an internal medicine clinic   Son lives nearby - he has diabetes and wears a Solicitor   Social Determinants of Health   Financial Resource Strain: Low Risk   . Difficulty of Paying Living Expenses: Not very hard  Food Insecurity: No Food Insecurity  . Worried About Charity fundraiser in the Last Year: Never true  . Ran Out of Food in the Last Year: Never true  Transportation Needs: No Transportation Needs  . Lack of Transportation (Medical): No  . Lack of Transportation (Non-Medical): No  Physical Activity: Inactive  . Days of Exercise per Week: 0 days  . Minutes of Exercise per Session: 0 min  Stress: No Stress Concern Present  . Feeling of Stress : Only a little  Social Connections: Moderately Isolated  . Frequency of Communication with Friends and Family: More than three times a week  . Frequency of Social Gatherings with Friends and Family: More than three times a week  . Attends Religious Services: Never  . Active Member of Clubs or Organizations: No  . Attends Archivist Meetings: Never  . Marital  Status: Widowed  Intimate Partner Violence: Not At Risk  . Fear of Current or Ex-Partner: No  . Emotionally Abused: No  . Physically Abused: No  . Sexually Abused: No    Family History:  Family History  Problem Relation Age of Onset  . Emphysema Brother        non smoker  . Diabetes Brother   . Hyperlipidemia Brother   . Hypertension Brother   . Heart disease Father   . Heart attack Father   . Diabetes Father   . Hyperlipidemia Father   . Hypertension Father   . Diabetes Mother   . Diabetes Sister   . Hyperlipidemia Sister   . Hypertension Sister   . Heart attack Daughter   . Diabetes Daughter   . Diabetes Son   . Suicidality Son   . Alcohol abuse Son   . Alcohol abuse Maternal Uncle   . Heart attack Paternal Grandfather   . Diabetes Daughter   . Cancer Son        melanoma  . Diabetes Son   . Diabetes Son      ROS:  Please see the history of present illness.   All other ROS reviewed and negative.     Physical Exam/Data:   Vitals:   10/15/19 0813 10/15/19 0828 10/15/19 0830 10/15/19 0859  BP: (!) 127/91     Pulse: (!) 128   (!) 130  Resp:      Temp:      TempSrc:      SpO2:  99% 99%   Weight:      Height:       No intake or output data in the 24 hours ending 10/15/19 1134 Last 3 Weights 10/14/2019 10/01/2019 06/16/2019  Weight (lbs) 401 lb 3.8 oz 187 lb 157 lb  Weight (kg) 182 kg 84.823 kg 71.215 kg  Body mass index is 64.76 kg/m.  General:  Well nourished, well developed, in no acute distress HEENT: normal Lymph: no adenopathy Neck: no JVD Endocrine:  No thryomegaly Vascular: No carotid bruits; FA pulses 2+ bilaterally without bruits  Cardiac:  normal S1, S2; Irreg Irreg; tachycardic; no murmur  Lungs:  mild wheezing,no rhonchi or rales; 2-3 L O2 Abd: soft, nontender, no hepatomegaly  Ext: trace edema Musculoskeletal:  No deformities, BUE and BLE strength normal and equal Skin: warm and dry  Neuro:  CNs 2-12 intact, no focal abnormalities  noted Psych:  Normal affect   EKG:  The EKG was personally reviewed and demonstrates:  Afib 123bpm Telemetry:  Telemetry was personally reviewed and demonstrates:  Afib RVR, rates 140-150s now in the 120s, occasional PVCs Relevant CV Studies:  Echo pending  Echo 09/27/18 1. The left ventricle has mild-moderately reduced systolic function of  94-70%. The cavity size was normal. There is no increased left ventricular  wall thickness. Echo evidence of pseudonormalization in diastolic  relaxation Elevated left ventricular  end-diastolic pressure.  2. There is akinesis of the entire inferior left ventricular segment.  3. The right ventricle has normal systolic function. The cavity was  normal. There is no increase in right ventricular wall thickness. Right  ventricular systolic pressure could not be assessed.  4. The mitral valve is normal in structure. There is mild thickening and  moderate calcification.  5. The tricuspid valve is normal in structure.  6. The aortic valve is normal in structure. There is mild thickening and  mild calcification of the aortic valve.   Echo 10/15/19 1. Left ventricular ejection fraction, by estimation, is 55 to 60%. The  left ventricle has normal function. The left ventricle has no regional  wall motion abnormalities. Unable to assess LV diastolic filling due ot  underlying atrial fibrillation.  2. Right ventricular systolic function is normal. The right ventricular  size is mildly enlarged.  3. The mitral valve is degenerative. There is mild thickening of the  mitral valve leaflet(s). There is mild calcification of the mitral valve  leaflet(s). Normal mobility of the mitral valve leaflets. Moderate mitral  annular calcification. Mild mitral  valve regurgitation. Mild mitral valve regurgitation. No evidence of  mitral stenosis.  4. The aortic valve is normal in structure and function. Aortic valve  regurgitation is not visualized. No aortic  stenosis is present.   Laboratory Data:  High Sensitivity Troponin:   Recent Labs  Lab 10/14/19 1940 10/14/19 2128  TROPONINIHS 8 8     Chemistry Recent Labs  Lab 10/14/19 1940 10/15/19 0520  NA 136 137  K 3.7 4.0  CL 101 101  CO2 26 25  GLUCOSE 285* 174*  BUN 8 8  CREATININE 0.92 0.90  CALCIUM 8.3* 8.7*  GFRNONAA 58* 59*  GFRAA >60 >60  ANIONGAP 9 11    Recent Labs  Lab 10/14/19 1940 10/15/19 0520  PROT 6.2* 6.6  ALBUMIN 2.7* 3.2*  AST 33 29  ALT 19 20  ALKPHOS 159* 154*  BILITOT 1.1 1.4*   Hematology Recent Labs  Lab 10/14/19 1940 10/15/19 0520  WBC 3.9* 2.8*  RBC 4.29 4.42  HGB 13.2 13.4  HCT 40.9 42.0  MCV 95.3 95.0  MCH 30.8 30.3  MCHC 32.3 31.9  RDW 14.1 14.1  PLT 75* 59*   BNP Recent Labs  Lab 10/14/19 1944  BNP 243.2*    DDimer No results for input(s): DDIMER in the last 168 hours.  Radiology/Studies:  DG Abd 1 View  Result Date: 10/15/2019 CLINICAL DATA:  Abdominal distension EXAM: ABDOMEN - 1 VIEW COMPARISON:  10/15/2019 FINDINGS: Two supine frontal views of the abdomen and pelvis with demonstrate an unremarkable bowel gas pattern. No masses or abnormal calcifications. Excreted contrast within the bladder. IMPRESSION: 1. Unremarkable bowel gas pattern. Electronically Signed   By: Randa Ngo M.D.   On: 10/15/2019 02:41   CT Angio Chest PE W and/or Wo Contrast  Result Date: 10/14/2019 CLINICAL DATA:  Shortness of breath. History of sleep apnea and prior myocardial infarct. EXAM: CT ANGIOGRAPHY CHEST WITH CONTRAST TECHNIQUE: Multidetector CT imaging of the chest was performed using the standard protocol during bolus administration of intravenous contrast. Multiplanar CT image reconstructions and MIPs were obtained to evaluate the vascular anatomy. CONTRAST:  168m OMNIPAQUE IOHEXOL 350 MG/ML SOLN COMPARISON:  10/13/2012. FINDINGS: Cardiovascular: Contrast injection is sufficient to demonstrate satisfactory opacification of the  pulmonary arteries to the segmental level. There is no pulmonary embolus. There are atherosclerotic changes of the thoracic aorta without evidence for an aneurysm. The heart size is enlarged. Coronary artery calcifications are noted. There is no significant pericardial effusion. Mediastinum/Nodes: --again noted is mediastinal adenopathy. This is essentially stable since 2014. --No axillary lymphadenopathy. --No supraclavicular lymphadenopathy. --Normal thyroid gland. --The esophagus is unremarkable Lungs/Pleura: There is atelectasis at the lung bases. There is some mild bronchial wall thickening and mucus plugging at the lung bases bilaterally. There is no pneumothorax. No significant pleural effusion. The trachea is unremarkable. Again noted is subpleural reticulation consistent with a mild chronic interstitial lung disease. Upper Abdomen: There is a large volume of ascites in the partially visualized upper abdomen. The liver is cirrhotic in appearance. The spleen is enlarged. Musculoskeletal: No chest wall abnormality. No acute or significant osseous findings. Review of the MIP images confirms the above findings. IMPRESSION: 1. No evidence for acute pulmonary embolism. 2. Mild bronchial wall thickening and mucus plugging at the lung bases bilaterally, which may reflect bronchitis. 3. Cardiomegaly with coronary artery calcifications. 4. Stable mediastinal adenopathy. 5. Cirrhotic appearance of the liver with evidence of portal hypertension including a moderate to large volume of abdominal ascites. 6. Aortic Atherosclerosis (ICD10-I70.0). Electronically Signed   By: CConstance HolsterM.D.   On: 10/14/2019 22:58   DG Chest Portable 1 View  Result Date: 10/14/2019 CLINICAL DATA:  Shortness of breath EXAM: PORTABLE CHEST 1 VIEW COMPARISON:  10/01/2019 FINDINGS: Elevation of the right diaphragm. Atelectasis at the right base. Mild cardiomegaly with aortic atherosclerosis. No consolidation, pleural effusion or  pneumothorax. IMPRESSION: Cardiomegaly.  Elevated right diaphragm with basilar atelectasis. Electronically Signed   By: KDonavan FoilM.D.   On: 10/14/2019 20:00   UKoreaAbdomen Limited RUQ  Result Date: 10/15/2019 CLINICAL DATA:  Cirrhosis, ascites, epigastric pain EXAM: ULTRASOUND ABDOMEN LIMITED RIGHT UPPER QUADRANT COMPARISON:  10/14/2019, 07/03/2019 FINDINGS: Gallbladder: Surgically absent Common bile duct: Diameter: 6 mm Liver: Liver is heterogeneous in echotexture with nodular contour compatible with cirrhosis. No focal hepatic abnormality. No intrahepatic biliary duct dilation. Portal vein is patent on color Doppler imaging with normal direction of blood flow towards the liver. Other: Ascites is seen within the bilateral upper quadrants, with trace fluid in the right lower quadrant. IMPRESSION: 1. Cirrhosis. 2. Upper abdominal ascites. 3. Cholecystectomy. Electronically Signed   By: MRanda NgoM.D.   On: 10/15/2019 02:19   { Assessment and Plan:   Acute on chronic respiratory failure - Multifactorial in the setting of COPD, bronchitis, and  possible acute HF - CTA chest negative for PE but showed possible bronchitis, cardiomegaly, and portal hypertension - Patient is on treatment for acute HF and COPD exacerbation - IR with no plans for paracentesis due to small ascites on US abdomen today - Oxygen requirements 2-3L   COPD exacerbation - Not on O2 at home  - was started on steroids and albuterol inhaler  Paroxysmal Afib - known h/o of PAF not on anticoagulation due to thrombocytopenia - In the past no amiodarone 2/2 to cirrhosis - Was in NSR the last follow-up visits. Has been rate controlled with metoprolol 12.5 mg BID - Presented with Afib RVR and soft pressures initially limited rate control.  - Home BB continued on admission - Pressures improved and started on IV dilt and rates are improving.  Given no anticoagulation, rate control is only option at this time, will continue IV  diltiazem  Ascites of liver/Liver cirrhosis - per CTA  - ordered IR thoracentesis>>trace amount of LLQ fluid by [place]. No procedure performed - GI consulted - spiro 100 mg daily - albumin given  Chronic combined HF - Echo 09/2018 showed EF 59-56%, diastolic dysfunction. Echo this admission EF 55-60% mildly enlarged RV, mild calcification of mitral valve with mild MR - BNP 243. No lower leg edema but ascites noted on CTA>> IR do not plan on procedure - Was on Lasix 40 mg BID for 10 days prior to admission for SOB - Currently on IV lasix 40 mg BID - creatinine stable - Monitor I/Os, daily weights, creatinine with diuresis  Thrombocytopenia - platelets 75 on admission  CAD - Cath in 2010 showed patent stents and nonobstructive disease - HS troponin negative x 2 - Chest discomfort likely related to Afib  DM2 - SSI per IM  HTN - on metoprolol 12.44m mg daily and spironolactone 100 mg daily at baseline>>continued - pressure soft on admission. Most recent 116/61   For questions or updates, please contact CGretnaPlease consult [email or web address] for contact info under    Signed, Cadence HNinfa Meeker PA-C  10/15/2019 11:34 AM   Patient seen and examined.  Agree with above documentation.  Ms. HSwinfordis an 86year old woman with a history of CAD status post drug-eluting stent to the RCA and LAD, NASH cirrhosis, COPD, ILD, chronic combined CHF, morbid obesity, SVT, type 2 diabetes, OSA, paroxysmal A. fib.  Cardiology is consulted for evaluation of atrial fibrillation with RVR at the request of Dr. ACathlean Hogan  She had an episode of atrial fibrillation during a hospitalization in January 2020 for COPD exacerbation.  She was started on beta-blocker for rate control, but has not been on anticoagulation due to her chronic thrombocytopenia.  Most recent echo 09/27/2018 showed EF 40 to 45%, inferior akinesis, normal RV function.  She presented to the ED with shortness of breath on 10/01/2019.  Was  discharged from the ED on Lasix 40 mg twice daily.  She presented back to the ED on 10/14/2019 with worsening shortness of breath.  In the ED, BP 107/67, SPO2 99% on 2 to 3 L O2, labs notable for platelets 75, hemoglobin 13.2, BNP 243, albumin 2.7, high-sensitivity troponin 8->8, creatinine 0.9.  EKG personally reviewed, shows atrial fibrillation, rate 123, inferior Q waves, incomplete right bundle branch block.  Telemetry personally reviewed, shows AF initially in 140s, now in 120s.  On exam, patient is alert and oriented, irregular rate, tachycardic, no murmurs, no crackles in lungs, trace edema.  TTE showed EF 55 to 60%, normal  RV function, mild MR.  For her atrial fibrillation, as she has not been on anticoagulation due to issues with thrombocytopenia, rate control remains the only option.  Rates do appear improved on IV diltiazem.  Echo with normal systolic function today, can continue diltiazem and titrate as BP tolerates.  While she does not appear significantly volume overloaded on exam, it is difficult to assess volume status given body habitus.  Reasonable to continue IV Lasix for now.  Madison Heinz, Hogan

## 2019-10-15 NOTE — Patient Outreach (Addendum)
  [facility]) Care Management Chronic Special Needs Program    10/15/2019  Name: [name], DOB: 1934-01-06  MRN: [id number]   [name] is enrolled in a chronic special needs plan for Diabetes. Client admitted to [facility]  on 10/14/19 with symptomatic ascites with known history of liver cirrhosis, worsening respiratory failure, atrial fibrillation with RVR, ascities and hypotension.  Individualized care plan sent to [facility] utilization management department/ team.    PLAN; RNCM will notify [facility] care management hospital liaison of client's admission and request follow up on client's discharge disposition.  RNCM will continue to follow as client's C-SNP care management coordinator.   George Ina RN,BSN,CCM Chronic Care Management Coordinator [facility] Care Management [phone]

## 2019-10-15 NOTE — ED Notes (Signed)
Report called to janet rn on 2w

## 2019-10-15 NOTE — Progress Notes (Signed)
OT Cancellation Note  Patient Details Name: [name] MRN: [id number] DOB: 1933/12/21   Cancelled Treatment:    Reason Eval/Treat Not Completed: Medical issues which prohibited therapy. Pt in A-fib with RVR. HR 120s at rest. Will hold and return as schedule allows.   [place], OTR/L Acute Rehab Pager: [phone] Office: [phone] 10/15/2019, 8:11 AM

## 2019-10-15 NOTE — Plan of Care (Signed)
  Problem: Education: Goal: Knowledge of disease or condition will improve Outcome: Progressing   Problem: Education: Goal: Understanding of medication regimen will improve Outcome: Progressing  Provided education on side effects of cardizem (hypotension). Patient verbalizes understanding that afib can cause chest palpitations, pressure or pain.

## 2019-10-15 NOTE — Progress Notes (Signed)
  Echocardiogram 2D Echocardiogram has been performed.  [name] 10/15/2019, 12:22 PM

## 2019-10-15 NOTE — Progress Notes (Signed)
PROGRESS NOTE    [name]  BEE:[id number] DOB: April 27, 1934 DOA: 10/14/2019 PCP: Mellody Dance, DO    Brief Narrative:  Patient admitted to the hospital with the working diagnosis of acute hypoxic respiratory failure due to cardiogenic pulmonary edema, diastolic heart failure exacerbation complicated atrial fibrillation with rapid ventricular response and worsening ascites.  86 year old female who presented with dyspnea, she does have significant past medical history for COPD, chronic hypoxic respiratory failure, type 2 diabetes mellitus, [place], diastolic heart failure and obstructive sleep apnea.  Patient reported the last few days had noticed increased abdominal distention, edema and weight gain.  Positive dyspnea and wheezing.  1 week prior to hospitalization she was seen in the emergency department, noted a 20 pound weight gain, but no significant ascites.  On her initial physical examination blood pressure 100/61, pulse rate 86, respiratory rate 14, temperature 98.3, oxygen saturation 99% with supplemental oxygen.  Heart S1-S2 present, no gallops, rubs or murmurs.  Lungs with wheezing and rales bilaterally, abdomen distended, positive lower extremity edema.  Sodium 136, potassium 3.7, chloride 101, bicarb 26, glucose 285, BUN 8, creatinine 0.92, white count 3.9, hemoglobin 13.2, hematocrit 40.9, platelets 75.  SARS COVID-19 negative.  Chest radiograph without increased hilar vascular congestion, right base atelectasis.  CT chest negative for pulmonary embolism.  Positive cirrhosis with moderate to large volume abdominal ascites.  EKG 123 bpm, normal axis, normal QRS, atrial fibrillation rhythm, no ST segment or T wave changes.  Patient has been placed on diltiazem drip for rate control, IV diuresis with furosemide and requested paracentesis.   Assessment & Plan:   Active Problems:   Chronic combined systolic (congestive) and diastolic (congestive) heart failure (HCC)   Coronary artery  disease   Hypotension   Thrombocytopenia, unspecified (HCC)   Hypothyroidism   chronic Leukopenia   Morbidly obese (HCC)- BMI >er 35 w DM   Gastroesophageal reflux disease   COPD exacerbation (HCC)   Acute on chronic diastolic CHF (congestive heart failure) (HCC)   Acute on chronic respiratory failure with hypoxia (HCC)   Liver cirrhosis secondary to NASH (HCC)   Paroxysmal atrial fibrillation (HCC)   Decompensated hepatic cirrhosis (HCC)   Ascites of liver   Portal hypertension (HCC)   COPD with acute exacerbation ([place])   1. Acute hypoxic respiratory failure due to acute decompensation of chronic diastolic heart failure, in the setting of atrial fibrillation with RVR. Patient continue to be hypervolemic this am, positive ascites.   Started on diltiazem drip for rate control, holding anticoagulation for now due to paracentesis. Increase furosemide to 40 mg IV to keep a negative fluid balance. Consult IR for paracentesis. Follow with echocardiogram report.   2. Cirrhosis due to NASH/ thrombocytopenia. Patient with worsening ascites, requested ultrasound guided paracentesis. No signs of encephalopathy or coagulopathy.   Follow on cell count in am.   3, CAD. No chest pain, continue close monitoring.  4. Hypothyroid. Continue levothyroxine.  5. Morbid obesity. Calculated BMI more than 35, will continue to encourage mobility, patient has high risk for hospital complications.   6. COPD/ No signs of acute exacerbation. Continue budesonide.   7. T2DM. Will continue insulin therapy with a reduced dose of her home basal insulin and insulin sliding scale for glucose cover and monitoring.   DVT prophylaxis: enoxaparin   Code Status:  full Family Communication: no family at the bedside  Disposition Plan/ discharge barriers: patient continue acutely ill     Subjective: Patient is feeling better, but continue  to have dyspnea and abdominal distention, not yet back to baseline, no nausea  or vomiting, no abdominal pain.   Objective: Vitals:   10/15/19 0813 10/15/19 0828 10/15/19 0830 10/15/19 0859  BP: (!) 127/91     Pulse: (!) 128   (!) 130  Resp:      Temp:      TempSrc:      SpO2:  99% 99%   Weight:      Height:       No intake or output data in the 24 hours ending 10/15/19 0903 Filed Weights   10/14/19 1922  Weight: (!) 182 kg    Examination:   General: Not in pain or dyspnea. Deconditioned  Neurology: Awake and alert, non focal  E ENT: mild pallor, no icterus, oral mucosa moist Cardiovascular: No JVD. S1-S2 present, irregularly irregular, no gallops, rubs, or murmurs. Trace bilateral lower extremity edema. Pulmonary: positive breath sounds bilaterally, adequate air movement, no wheezing, rhonchi or rales. Gastrointestinal. Abdomen mild distended. Positive asictes, no organomegaly, non tender, no rebound or guarding Skin. No rashes Musculoskeletal: no joint deformities     Data Reviewed: I have personally reviewed following labs and imaging studies  CBC: Recent Labs  Lab 10/14/19 1940 10/15/19 0520  WBC 3.9* 2.8*  NEUTROABS 2.3  --   HGB 13.2 13.4  HCT 40.9 42.0  MCV 95.3 95.0  PLT 75* 59*   Basic Metabolic Panel: Recent Labs  Lab 10/14/19 1940 10/15/19 0520  NA 136 137  K 3.7 4.0  CL 101 101  CO2 26 25  GLUCOSE 285* 174*  BUN 8 8  CREATININE 0.92 0.90  CALCIUM 8.3* 8.7*  MG  --  1.7  PHOS  --  3.1   GFR: Estimated Creatinine Clearance: 81 mL/min (by C-G formula based on SCr of 0.9 mg/dL). Liver Function Tests: Recent Labs  Lab 10/14/19 1940 10/15/19 0520  AST 33 29  ALT 19 20  ALKPHOS 159* 154*  BILITOT 1.1 1.4*  PROT 6.2* 6.6  ALBUMIN 2.7* 3.2*   No results for input(s): LIPASE, AMYLASE in the last 168 hours. Recent Labs  Lab 10/15/19 0154  AMMONIA 44*   Coagulation Profile: Recent Labs  Lab 10/15/19 0154  INR 1.2   Cardiac Enzymes: No results for input(s): CKTOTAL, CKMB, CKMBINDEX, TROPONINI in the last  168 hours. BNP (last 3 results) No results for input(s): PROBNP in the last 8760 hours. HbA1C: Recent Labs    10/15/19 0520  HGBA1C 7.9*   CBG: Recent Labs  Lab 10/15/19 0459 10/15/19 0816  GLUCAP 149* 189*   Lipid Profile: No results for input(s): CHOL, HDL, LDLCALC, TRIG, CHOLHDL, LDLDIRECT in the last 72 hours. Thyroid Function Tests: Recent Labs    10/15/19 0520  TSH 1.662   Anemia Panel: No results for input(s): VITAMINB12, FOLATE, FERRITIN, TIBC, IRON, RETICCTPCT in the last 72 hours.    Radiology Studies: I have reviewed all of the imaging during this hospital visit personally     Scheduled Meds: . [START ON 10/16/2019] azithromycin  500 mg Oral Daily  . brimonidine  1 drop Both Eyes Daily  . budesonide (PULMICORT) nebulizer solution  2 mg Nebulization BID  . cycloSPORINE  1 drop Both Eyes BID  . escitalopram  20 mg Oral Daily  . furosemide  20 mg Intravenous Q12H  . insulin aspart  0-9 Units Subcutaneous Q4H  . insulin glargine  40 Units Subcutaneous QHS  . levothyroxine  100 mcg Oral Daily  .  metoprolol tartrate  12.5 mg Oral BID  . mometasone-formoterol  1 puff Inhalation BID  . montelukast  10 mg Oral QPM  . pantoprazole  20 mg Oral BID  . predniSONE  40 mg Oral Q breakfast  . sodium chloride flush  3 mL Intravenous Q12H   Continuous Infusions: . sodium chloride       LOS: 1 day        Madison Kuehnle Gerome Apley, MD

## 2019-10-15 NOTE — Progress Notes (Signed)
Nutrition Education Note  RD consulted for nutrition education regarding new onset heart failure. Patient says that she receives a frozen meal from a health agency every day. She thinks the meals are healthy and don't taste like they have much salt in them. She doesn't add salt to any foods. She tries to follow a low salt diet at home.   RD provided "Heart Healthy Low Sodium Nutrition Therapy" handout from the Academy of Nutrition and Dietetics. Reviewed patient's dietary recall. Provided examples on ways to decrease sodium intake in diet. Discouraged intake of processed foods and use of salt shaker. Encouraged fresh fruits and vegetables as well as whole grain sources of carbohydrates to maximize fiber intake.   Teach back method used.  Expect fair compliance.   Body mass index is 64.76 kg/m. Pt meets criteria for class 3, extreme/morbid obesity based on current BMI.  Current diet order is heart healthy CHO modified, patient is consuming approximately 75% of meals at this time. Labs and medications reviewed. No further nutrition interventions warranted at this time. RD contact information provided. If additional nutrition issues arise, please re-consult RD.    Molli Barrows, RD, LDN, CNSC Please refer to [facility] for contact information.

## 2019-10-16 DIAGNOSIS — J9621 Acute and chronic respiratory failure with hypoxia: Secondary | ICD-10-CM

## 2019-10-16 DIAGNOSIS — I5032 Chronic diastolic (congestive) heart failure: Secondary | ICD-10-CM

## 2019-10-16 LAB — CBC WITH DIFFERENTIAL/PLATELET
Abs Immature Granulocytes: 0.04 10*3/uL (ref 0.00–0.07)
Basophils Absolute: 0 10*3/uL (ref 0.0–0.1)
Basophils Relative: 0 %
Eosinophils Absolute: 0 10*3/uL (ref 0.0–0.5)
Eosinophils Relative: 0 %
HCT: 38.6 % (ref 36.0–46.0)
Hemoglobin: 12.5 g/dL (ref 12.0–15.0)
Immature Granulocytes: 1 %
Lymphocytes Relative: 6 %
Lymphs Abs: 0.5 10*3/uL — ABNORMAL LOW (ref 0.7–4.0)
MCH: 30.8 pg (ref 26.0–34.0)
MCHC: 32.4 g/dL (ref 30.0–36.0)
MCV: 95.1 fL (ref 80.0–100.0)
Monocytes Absolute: 1 10*3/uL (ref 0.1–1.0)
Monocytes Relative: 12 %
Neutro Abs: 6.7 10*3/uL (ref 1.7–7.7)
Neutrophils Relative %: 81 %
Platelets: 90 10*3/uL — ABNORMAL LOW (ref 150–400)
RBC: 4.06 MIL/uL (ref 3.87–5.11)
RDW: 14.1 % (ref 11.5–15.5)
WBC: 8.2 10*3/uL (ref 4.0–10.5)
nRBC: 0 % (ref 0.0–0.2)

## 2019-10-16 LAB — BASIC METABOLIC PANEL
Anion gap: 12 (ref 5–15)
BUN: 23 mg/dL (ref 8–23)
CO2: 22 mmol/L (ref 22–32)
Calcium: 8.5 mg/dL — ABNORMAL LOW (ref 8.9–10.3)
Chloride: 98 mmol/L (ref 98–111)
Creatinine, Ser: 1.34 mg/dL — ABNORMAL HIGH (ref 0.44–1.00)
GFR calc Af Amer: 42 mL/min — ABNORMAL LOW (ref >60)
GFR calc non Af Amer: 37 mL/min — ABNORMAL LOW (ref >60)
Glucose, Bld: 333 mg/dL — ABNORMAL HIGH (ref 70–99)
Potassium: 4.7 mmol/L (ref 3.5–5.1)
Sodium: 132 mmol/L — ABNORMAL LOW (ref 135–145)

## 2019-10-16 LAB — GLUCOSE, CAPILLARY
Glucose-Capillary: 260 mg/dL — ABNORMAL HIGH (ref 70–99)
Glucose-Capillary: 271 mg/dL — ABNORMAL HIGH (ref 70–99)
Glucose-Capillary: 283 mg/dL — ABNORMAL HIGH (ref 70–99)
Glucose-Capillary: 293 mg/dL — ABNORMAL HIGH (ref 70–99)
Glucose-Capillary: 325 mg/dL — ABNORMAL HIGH (ref 70–99)
Glucose-Capillary: 334 mg/dL — ABNORMAL HIGH (ref 70–99)

## 2019-10-16 MED ORDER — METOPROLOL TARTRATE 12.5 MG HALF TABLET
12.5000 mg | ORAL_TABLET | Freq: Once | ORAL | Status: AC
  Administered 2019-10-16: 12.5 mg via ORAL
  Filled 2019-10-16: qty 1

## 2019-10-16 MED ORDER — INSULIN GLARGINE 100 UNIT/ML ~~LOC~~ SOLN
20.0000 [IU] | Freq: Two times a day (BID) | SUBCUTANEOUS | Status: DC
  Administered 2019-10-16 – 2019-10-17 (×2): 20 [IU] via SUBCUTANEOUS
  Filled 2019-10-16 (×3): qty 0.2

## 2019-10-16 MED ORDER — METOPROLOL TARTRATE 25 MG PO TABS
25.0000 mg | ORAL_TABLET | Freq: Two times a day (BID) | ORAL | Status: DC
  Administered 2019-10-16: 25 mg via ORAL
  Filled 2019-10-16: qty 1

## 2019-10-16 MED ORDER — INSULIN GLARGINE 100 UNIT/ML ~~LOC~~ SOLN
10.0000 [IU] | Freq: Once | SUBCUTANEOUS | Status: AC
  Administered 2019-10-16: 10 [IU] via SUBCUTANEOUS
  Filled 2019-10-16: qty 0.1

## 2019-10-16 NOTE — TOC Initial Note (Signed)
Transition of Care [facility]) - Initial/Assessment Note    Patient Details  Name: [name] MRN: [id number] Date of Birth: 1933/12/13  Transition of Care [facility]) CM/SW Contact:    Maryclare Labrador, RN Phone Number: 10/16/2019, 2:03 PM  Clinical Narrative:     PTA independent from home alone, pt is blind in one eye but denies barriers with ADLs.    PTA is on 2 liters continuous oxygen at home supplied by [employer].  Per pt; family will pick her up at discharge with portable oxygen tank for transport home.   Pt confirms she has a PCP - pt declined for CM to arrange post discharge follow up appt.  Pt informed CM explained to pt that therapy recommends intermittent supervision when she discharges home. Pt told CM she understood and that she has adult children that will provide whatever she needs including necessary supervision.  Pt is interested in [facility] as recommended - CM provided medicare.gov HH list - pt chose [facility] accepts.   Orders requested.   Pt confirms she weighs herself daily and tries to adhere to low salt diet.              Expected Discharge Plan: [place] Barriers to Discharge: Continued Medical Work up   Patient Goals and CMS Choice Patient states their goals for this hospitalization and ongoing recovery are:: Pt would like to be able to stop wheezing , "I wasn't doing this before" CMS Medicare.gov Compare Post Acute Care list provided to:: Patient Choice offered to / list presented to : Patient  Expected Discharge Plan and Services Expected Discharge Plan: [place]       Living arrangements for the past 2 months: Single Family Home                           HH Arranged: PT HH Agency: [place] Date [facility] Agency Contacted: 10/16/19 Time HH Agency Contacted: 100 Representative spoke with at [place]: Tommi Rumps  Prior Living Arrangements/Services Living arrangements for the past 2 months: [place] with:: Self Patient language and need for interpreter reviewed:: Yes Do you feel safe going back to the place where you live?: Yes      Need for Family Participation in Patient Care: Yes (Comment) Care giver support system in place?: Yes (comment)   Criminal Activity/Legal Involvement Pertinent to Current Situation/Hospitalization: No - Comment as needed  Activities of Daily Living Home Assistive Devices/Equipment: None ADL Screening (condition at time of admission) Patient's cognitive ability adequate to safely complete daily activities?: Yes Is the patient deaf or have difficulty hearing?: Yes Does the patient have difficulty seeing, even when wearing glasses/contacts?: No Does the patient have difficulty concentrating, remembering, or making decisions?: No Patient able to express need for assistance with ADLs?: Yes Does the patient have difficulty dressing or bathing?: No Independently performs ADLs?: Yes (appropriate for developmental age) Does the patient have difficulty walking or climbing stairs?: Yes Weakness of Legs: Both Weakness of Arms/Hands: None  Permission Sought/Granted   Permission granted to share information with : Yes, Verbal Permission Granted              Emotional Assessment   Attitude/Demeanor/Rapport: Gracious, Charismatic, Self-Confident, Engaged Affect (typically observed): Accepting, Adaptable Orientation: : Oriented to Self, Oriented to Place, Oriented to  Time, Oriented to Situation   Psych Involvement: No (comment)  Admission diagnosis:  Abdominal distention [R14.0] SOB (shortness of breath) [R06.02] Hypoxia [R09.02] COPD with acute exacerbation ([place]) [J44.1] Ascites [R18.8] Atrial fibrillation, unspecified type [facility]) [I48.91] Patient Active Problem List   Diagnosis Date Noted  . Atrial fibrillation with RVR ([place])   . COPD with acute exacerbation ([place]) 10/14/2019  . Environmental and seasonal allergies 06/03/2019  . Ascites  of liver 05/05/2019  . Portal hypertension ([place]) 05/05/2019  . Decompensated hepatic cirrhosis ([place]) 04/17/2019  . Myocardial infarct, old 01/01/2019  . Paroxysmal atrial fibrillation ([place]) 10/28/2018  . Liver cirrhosis secondary to NASH ([place]) 10/16/2018  . Pancytopenia ([place]) 09/26/2018  . COPD exacerbation ([place]) 09/26/2018  . Acute on chronic diastolic CHF (congestive heart failure) ([place]) 09/26/2018  . Acute on chronic respiratory failure with hypoxia ([place]) 09/26/2018  . PNA (pneumonia) 09/15/2018  . Microalbuminuria due to type 2 diabetes mellitus ([place]) 08/26/2018  . Hypocalcemia 03/12/2018  . Gastroesophageal reflux disease 12/31/2017  . chronic Low platelet count 06/05/2017  . Hypertension associated with diabetes ([place]) 05/22/2017  . Morbidly obese ([place])- BMI >er 35 w DM 06/23/2016  . chronic Leukopenia 05/11/2016  . Vitamin D deficiency 05/11/2016  . Elevated serum alkaline phosphatase level- hepatocellular in origin 05/11/2016  . Depression 03/13/2016  . Hypothyroidism 03/13/2016  . Poorly controlled diabetes mellitus ([place]) 03/13/2016  . Mixed diabetic hyperlipidemia associated with type 1 diabetes mellitus ([place]) 03/13/2016  . Constipation, chronic/  IBS syndrome 03/13/2016  . At high risk for osteoporosis 03/13/2016  . Hypotension 12/13/2012  . Hypokalemia due to excessive renal loss of potassium 12/13/2012  . Thrombocytopenia, unspecified ([place]) 12/13/2012  . h/o Clostridium difficile colitis 12/12/2012  . History of GI bleed 12/12/2012  . ILD (interstitial lung disease) ([place]) 11/25/2012  . Leg edema 01/07/2012  . Coronary artery disease 04/11/2011  . COPD (chronic obstructive pulmonary disease) ([place]) 04/11/2011  . Obstructive sleep apnea 04/11/2011  . Chronic diastolic heart failure ([place]) 03/29/2008  . Atherosclerosis of carotid arteries 04/03/2006   PCP:  Mellody Dance, DO Pharmacy:   [facility], [address] [place] [address] Phone: [phone] Fax: [phone]  [place], [place] [address] [place] [address] Phone: [phone] Fax: [phone]  [facility] [phone] - [place], [address] AT [place] [address] [place] [address] Phone: [phone] Fax: [phone]     Social Determinants of Health (SDOH) Interventions    Readmission Risk Interventions No flowsheet data found.

## 2019-10-16 NOTE — Progress Notes (Signed)
PROGRESS NOTE    [name]  JIR:[id number] DOB: Oct 17, 1933 DOA: 10/14/2019 PCP: Mellody Dance, DO    Brief Narrative:  Patient admitted to the hospital with the working diagnosis of acute hypoxic respiratory failure due to cardiogenic pulmonary edema, diastolic heart failure exacerbation complicated atrial fibrillation with rapid ventricular response and worsening ascites.  86 year old female who presented with dyspnea, she does have significant past medical history for COPD, chronic hypoxic respiratory failure, type 2 diabetes mellitus, [place], diastolic heart failure and obstructive sleep apnea.  Patient reported the last few days had noticed increased abdominal distention, edema and weight gain.  Positive dyspnea and wheezing.  1 week prior to hospitalization she was seen in the emergency department, noted a 20 pound weight gain, but no significant ascites.  On her initial physical examination blood pressure 100/61, pulse rate 86, respiratory rate 14, temperature 98.3, oxygen saturation 99% with supplemental oxygen.  Heart S1-S2 present, no gallops, rubs or murmurs.  Lungs with wheezing and rales bilaterally, abdomen distended, positive lower extremity edema.  Sodium 136, potassium 3.7, chloride 101, bicarb 26, glucose 285, BUN 8, creatinine 0.92, white count 3.9, hemoglobin 13.2, hematocrit 40.9, platelets 75.  SARS COVID-19 negative.  Chest radiograph without increased hilar vascular congestion, right base atelectasis.  CT chest negative for pulmonary embolism.  Positive cirrhosis with moderate to large volume abdominal ascites.  EKG 123 bpm, normal axis, normal QRS, atrial fibrillation rhythm, no ST segment or T wave changes.  Patient has been placed on diltiazem drip for rate control, IV diuresis with furosemide and requested paracentesis.   Not enough fluid for paracentesis, diuresis on hold due to worsening renal function.    Assessment & Plan:   Active Problems:   Chronic  combined systolic (congestive) and diastolic (congestive) heart failure (HCC)   Coronary artery disease   Hypotension   Thrombocytopenia, unspecified (HCC)   Hypothyroidism   chronic Leukopenia   Morbidly obese (HCC)- BMI >er 35 w DM   Gastroesophageal reflux disease   COPD exacerbation (HCC)   Acute on chronic diastolic CHF (congestive heart failure) (HCC)   Acute on chronic respiratory failure with hypoxia (HCC)   Liver cirrhosis secondary to NASH (HCC)   Paroxysmal atrial fibrillation (HCC)   Decompensated hepatic cirrhosis (HCC)   Ascites of liver   Portal hypertension ([name])   COPD with acute exacerbation (HCC)   Atrial fibrillation with RVR ([place])   1. Acute hypoxic respiratory failure due to acute decompensation of chronic diastolic heart failure, in the setting of paroxysmal atrial fibrillation with RVR. Improve volume this am, heart rate still not controlled, no enough fluid for paracentesis. No documentation of urine output this am.   Will continue with diltiazem drip for rate control, target HR less than 130 bpm, will increase metoprolol to 25 mg po bid, and will hold on furosemide for now.  Keep MAP more than 60 mmHg. Continue telemetry monitoring, patient not on anticoagulation due to thrombocytopenia.   2. AKI with non gap metabolic acidosis Renal function with serum cr at 1,34 with K at 4,7 and serum bicarbonate at 22, will hold on diuresis for now, and will follow on renal panel in am.  Patient continue to be at high risk for worsening renal failure.   3. Cirrhosis due to NASH/ thrombocytopenia. No encephalopathy or coagulopathy. No enough fluid for paracentesis. Platelet up to 90 from 59.   Follow on cell count in am.   4, CAD. Stable. No antiplatelet therapy due to thrombocytopenia.  5. Hypothyroid. On levothyroxine.  6. Morbid obesity. Calculated BMI more than 35, follow with PT and OT recommendations.   7. COPD/ No signs of acute exacerbation. On  budesonide.   8. T2DM.  Fasting glucose up to 333, capillary 334, will increase her basal insulin to 20 units bid of glargine and continue sliding scale for glucose cover and monitoring.  DVT prophylaxis: enoxaparin   Code Status:  full Family Communication: no family at the bedside  Disposition Plan/ discharge barriers: patient continue acutely ill    Consultants:   Cardiology     Subjective: Patient continue to have dyspnea, and palpitations, her heart rate continue to be uncontrolled with worsening tachycardia this am, no nausea or vomiting, no abdominal pain.   Objective: Vitals:   10/16/19 0730 10/16/19 0800 10/16/19 0845 10/16/19 0921  BP: 107/76 111/71  (!) 97/50  Pulse:      Resp: 20 18  20   Temp: 97.7 F (36.5 C)     TempSrc: Oral     SpO2: 99% 99% 97% 100%  Weight:      Height:        Intake/Output Summary (Last 24 hours) at 10/16/2019 0929 Last data filed at 10/15/2019 1500 Gross per 24 hour  Intake 328.23 ml  Output --  Net 328.23 ml   Filed Weights   10/14/19 1922  Weight: (!) 182 kg    Examination:   General: Not in pain or dyspnea. Deconditioned  Neurology: Awake and alert, non focal  E ENT: no pallor, no icterus, oral mucosa moist Cardiovascular: No JVD. S1-S2 present, irregularly irregular with no gallops, rubs, or murmurs. No lower extremity edema. Pulmonary: positive breath sounds bilaterally, adequate air movement, no wheezing, rhonchi or rales. Gastrointestinal. Abdomen with distention, but no organomegaly, non tender, no rebound or guarding Skin. No rashes Musculoskeletal: no joint deformities     Data Reviewed: I have personally reviewed following labs and imaging studies  CBC: Recent Labs  Lab 10/14/19 1940 10/15/19 0520 10/16/19 0553  WBC 3.9* 2.8* 8.2  NEUTROABS 2.3  --  6.7  HGB 13.2 13.4 12.5  HCT 40.9 42.0 38.6  MCV 95.3 95.0 95.1  PLT 75* 59* 90*   Basic Metabolic Panel: Recent Labs  Lab 10/14/19 1940  10/15/19 0520 10/16/19 0252  NA 136 137 132*  K 3.7 4.0 4.7  CL 101 101 98  CO2 26 25 22   GLUCOSE 285* 174* 333*  BUN 8 8 23   CREATININE 0.92 0.90 1.34*  CALCIUM 8.3* 8.7* 8.5*  MG  --  1.7  --   PHOS  --  3.1  --    GFR: Estimated Creatinine Clearance: 54.4 mL/min (A) (by C-G formula based on SCr of 1.34 mg/dL (H)). Liver Function Tests: Recent Labs  Lab 10/14/19 1940 10/15/19 0520  AST 33 29  ALT 19 20  ALKPHOS 159* 154*  BILITOT 1.1 1.4*  PROT 6.2* 6.6  ALBUMIN 2.7* 3.2*   No results for input(s): LIPASE, AMYLASE in the last 168 hours. Recent Labs  Lab 10/15/19 0154  AMMONIA 44*   Coagulation Profile: Recent Labs  Lab 10/15/19 0154  INR 1.2   Cardiac Enzymes: No results for input(s): CKTOTAL, CKMB, CKMBINDEX, TROPONINI in the last 168 hours. BNP (last 3 results) No results for input(s): PROBNP in the last 8760 hours. HbA1C: Recent Labs    10/15/19 0520  HGBA1C 7.9*   CBG: Recent Labs  Lab 10/15/19 1959 10/15/19 2040 10/16/19 6256 10/16/19 0428 10/16/19 3893  GLUCAP 380* 385* 325* 283* 260*   Lipid Profile: No results for input(s): CHOL, HDL, LDLCALC, TRIG, CHOLHDL, LDLDIRECT in the last 72 hours. Thyroid Function Tests: Recent Labs    10/15/19 0520  TSH 1.662   Anemia Panel: No results for input(s): VITAMINB12, FOLATE, FERRITIN, TIBC, IRON, RETICCTPCT in the last 72 hours.    Radiology Studies: I have reviewed all of the imaging during this hospital visit personally     Scheduled Meds: . brimonidine  1 drop Both Eyes Daily  . budesonide (PULMICORT) nebulizer solution  2 mg Nebulization BID  . cycloSPORINE  1 drop Both Eyes BID  . escitalopram  20 mg Oral Daily  . hydroxypropyl methylcellulose / hypromellose  1 drop Both Eyes BID  . insulin aspart  0-9 Units Subcutaneous Q4H  . insulin glargine  20 Units Subcutaneous QHS  . levothyroxine  100 mcg Oral Daily  . metoprolol tartrate  12.5 mg Oral BID  . mometasone-formoterol  1  puff Inhalation BID  . montelukast  10 mg Oral QPM  . pantoprazole  20 mg Oral BID  . sodium chloride flush  3 mL Intravenous Q12H  . spironolactone  100 mg Oral Daily   Continuous Infusions: . sodium chloride    . diltiazem (CARDIZEM) infusion 7.5 mg/hr (10/16/19 0920)     LOS: 2 days        Philip Kotlyar Gerome Apley, MD

## 2019-10-16 NOTE — Progress Notes (Signed)
Progress Note  Patient Name: [name] Date of Encounter: 10/16/2019  Primary Cardiologist: Mertie Moores, MD   Subjective   Reports dyspnea improving.  Denies any chest pain  Inpatient Medications    Scheduled Meds: . brimonidine  1 drop Both Eyes Daily  . budesonide (PULMICORT) nebulizer solution  2 mg Nebulization BID  . cycloSPORINE  1 drop Both Eyes BID  . escitalopram  20 mg Oral Daily  . hydroxypropyl methylcellulose / hypromellose  1 drop Both Eyes BID  . insulin aspart  0-9 Units Subcutaneous Q4H  . insulin glargine  20 Units Subcutaneous QHS  . levothyroxine  100 mcg Oral Daily  . metoprolol tartrate  12.5 mg Oral BID  . mometasone-formoterol  1 puff Inhalation BID  . montelukast  10 mg Oral QPM  . pantoprazole  20 mg Oral BID  . sodium chloride flush  3 mL Intravenous Q12H  . spironolactone  100 mg Oral Daily   Continuous Infusions: . sodium chloride    . diltiazem (CARDIZEM) infusion 7.5 mg/hr (10/16/19 0920)   PRN Meds: sodium chloride, acetaminophen **OR** acetaminophen, albuterol, HYDROcodone-acetaminophen, levalbuterol, ondansetron **OR** ondansetron (ZOFRAN) IV, sodium chloride flush   Vital Signs    Vitals:   10/16/19 0800 10/16/19 0845 10/16/19 0921 10/16/19 0943  BP: 111/71  (!) 97/50 118/63  Pulse:    (!) 133  Resp: 18  20   Temp:      TempSrc:      SpO2: 99% 97% 100%   Weight:      Height:        Intake/Output Summary (Last 24 hours) at 10/16/2019 1020 Last data filed at 10/15/2019 1500 Gross per 24 hour  Intake 328.23 ml  Output --  Net 328.23 ml   Last 3 Weights 10/14/2019 10/01/2019 06/16/2019  Weight (lbs) 401 lb 3.8 oz 187 lb 157 lb  Weight (kg) 182 kg 84.823 kg 71.215 kg      Telemetry    Atrial fibrillation with rates 90s to 140s, currently 100s to 120s, PVCs- Personally Reviewed  ECG    No new ECG - Personally Reviewed  Physical Exam   GEN: No acute distress.   Neck: JVD difficult to assess given  habitus Cardiac: irregular, tachycardic, no murmurs Respiratory: Expiratory wheezing.  Bibasilar crackles GI: nontender, distended MS: No edema; Neuro:  Nonfocal  Psych: Normal affect   Labs    High Sensitivity Troponin:   Recent Labs  Lab 10/14/19 1940 10/14/19 2128  TROPONINIHS 8 8      Chemistry Recent Labs  Lab 10/14/19 1940 10/15/19 0520 10/16/19 0252  NA 136 137 132*  K 3.7 4.0 4.7  CL 101 101 98  CO2 26 25 22   GLUCOSE 285* 174* 333*  BUN 8 8 23   CREATININE 0.92 0.90 1.34*  CALCIUM 8.3* 8.7* 8.5*  PROT 6.2* 6.6  --   ALBUMIN 2.7* 3.2*  --   AST 33 29  --   ALT 19 20  --   ALKPHOS 159* 154*  --   BILITOT 1.1 1.4*  --   GFRNONAA 58* 59* 37*  GFRAA >60 >60 42*  ANIONGAP 9 11 12      Hematology Recent Labs  Lab 10/14/19 1940 10/15/19 0520 10/16/19 0553  WBC 3.9* 2.8* 8.2  RBC 4.29 4.42 4.06  HGB 13.2 13.4 12.5  HCT 40.9 42.0 38.6  MCV 95.3 95.0 95.1  MCH 30.8 30.3 30.8  MCHC 32.3 31.9 32.4  RDW 14.1 14.1 14.1  PLT 75* 59* 90*    BNP Recent Labs  Lab 10/14/19 1944  BNP 243.2*     DDimer No results for input(s): DDIMER in the last 168 hours.   Radiology    DG Abd 1 View  Result Date: 10/15/2019 CLINICAL DATA:  Abdominal distension EXAM: ABDOMEN - 1 VIEW COMPARISON:  10/15/2019 FINDINGS: Two supine frontal views of the abdomen and pelvis with demonstrate an unremarkable bowel gas pattern. No masses or abnormal calcifications. Excreted contrast within the bladder. IMPRESSION: 1. Unremarkable bowel gas pattern. Electronically Signed   By: Randa Ngo M.D.   On: 10/15/2019 02:41   CT Angio Chest PE W and/or Wo Contrast  Result Date: 10/14/2019 CLINICAL DATA:  Shortness of breath. History of sleep apnea and prior myocardial infarct. EXAM: CT ANGIOGRAPHY CHEST WITH CONTRAST TECHNIQUE: Multidetector CT imaging of the chest was performed using the standard protocol during bolus administration of intravenous contrast. Multiplanar CT image  reconstructions and MIPs were obtained to evaluate the vascular anatomy. CONTRAST:  194m OMNIPAQUE IOHEXOL 350 MG/ML SOLN COMPARISON:  10/13/2012. FINDINGS: Cardiovascular: Contrast injection is sufficient to demonstrate satisfactory opacification of the pulmonary arteries to the segmental level. There is no pulmonary embolus. There are atherosclerotic changes of the thoracic aorta without evidence for an aneurysm. The heart size is enlarged. Coronary artery calcifications are noted. There is no significant pericardial effusion. Mediastinum/Nodes: --again noted is mediastinal adenopathy. This is essentially stable since 2014. --No axillary lymphadenopathy. --No supraclavicular lymphadenopathy. --Normal thyroid gland. --The esophagus is unremarkable Lungs/Pleura: There is atelectasis at the lung bases. There is some mild bronchial wall thickening and mucus plugging at the lung bases bilaterally. There is no pneumothorax. No significant pleural effusion. The trachea is unremarkable. Again noted is subpleural reticulation consistent with a mild chronic interstitial lung disease. Upper Abdomen: There is a large volume of ascites in the partially visualized upper abdomen. The liver is cirrhotic in appearance. The spleen is enlarged. Musculoskeletal: No chest wall abnormality. No acute or significant osseous findings. Review of the MIP images confirms the above findings. IMPRESSION: 1. No evidence for acute pulmonary embolism. 2. Mild bronchial wall thickening and mucus plugging at the lung bases bilaterally, which may reflect bronchitis. 3. Cardiomegaly with coronary artery calcifications. 4. Stable mediastinal adenopathy. 5. Cirrhotic appearance of the liver with evidence of portal hypertension including a moderate to large volume of abdominal ascites. 6. Aortic Atherosclerosis (ICD10-I70.0). Electronically Signed   By: CConstance HolsterM.D.   On: 10/14/2019 22:58   DG Chest Portable 1 View  Result Date:  10/14/2019 CLINICAL DATA:  Shortness of breath EXAM: PORTABLE CHEST 1 VIEW COMPARISON:  10/01/2019 FINDINGS: Elevation of the right diaphragm. Atelectasis at the right base. Mild cardiomegaly with aortic atherosclerosis. No consolidation, pleural effusion or pneumothorax. IMPRESSION: Cardiomegaly.  Elevated right diaphragm with basilar atelectasis. Electronically Signed   By: KDonavan FoilM.D.   On: 10/14/2019 20:00   ECHOCARDIOGRAM COMPLETE  Result Date: 10/15/2019    ECHOCARDIOGRAM REPORT   [facility]   [facility] 10[facility]15/2019 Medical Rec #:  [id number]       Height:       66.0 in Accession #:    [id number]      Weight:       182.2 lb Date of Birth:  [birth year]       BSA:          1.922 m Patient Age:    89years  BP:           116/61 mmHg Patient Gender: F                HR:           124 bpm. Exam Location:  Inpatient Procedure: 2D Echo Indications:    CHF 428  History:        Patient has prior history of Echocardiogram examinations, most                 recent 09/27/2018. Previous Myocardial Infarction and CAD; Risk                 Factors:Dyslipidemia, Diabetes and Former Smoker.  Sonographer:    Jannett Celestine RDCS (AE) Referring Phys: [place]  Sonographer Comments: limited mobility IMPRESSIONS  1. Left ventricular ejection fraction, by estimation, is 55 to 60%. The left ventricle has normal function. The left ventricle has no regional wall motion abnormalities. Unable to assess LV diastolic filling due ot underlying atrial fibrillation.  2. Right ventricular systolic function is normal. The right ventricular size is mildly enlarged.  3. The mitral valve is degenerative. There is mild thickening of the mitral valve leaflet(s). There is mild calcification of the mitral valve leaflet(s). Normal mobility of the mitral valve leaflets. Moderate mitral annular calcification. Mild mitral valve regurgitation. Mild mitral valve regurgitation. No evidence of mitral  stenosis.  4. The aortic valve is normal in structure and function. Aortic valve regurgitation is not visualized. No aortic stenosis is present. FINDINGS  Left Ventricle: Left ventricular ejection fraction, by estimation, is 55 to 60%. The left ventricle has normal function. The left ventricle has no regional wall motion abnormalities. The left ventricular internal cavity size was normal in size. There is  no left ventricular hypertrophy. Unable to assess LV diastolic filling due ot underlying atrial fibrillation. Right Ventricle: The right ventricular size is mildly enlarged. No increase in right ventricular wall thickness. Right ventricular systolic function is normal. Left Atrium: Left atrial size was normal in size. Right Atrium: Right atrial size was normal in size. Pericardium: There is no evidence of pericardial effusion. Mitral Valve: The mitral valve is degenerative in appearance. There is mild thickening of the mitral valve leaflet(s). There is mild calcification of the mitral valve leaflet(s). Normal mobility of the mitral valve leaflets. Moderate mitral annular calcification. Mild mitral valve regurgitation. No evidence of mitral valve stenosis. Tricuspid Valve: The tricuspid valve is normal in structure. Tricuspid valve regurgitation is not demonstrated. No evidence of tricuspid stenosis. Aortic Valve: The aortic valve is normal in structure and function. Aortic valve regurgitation is not visualized. No aortic stenosis is present. Pulmonic Valve: The pulmonic valve was normal in structure. Pulmonic valve regurgitation is trivial. No evidence of pulmonic stenosis. Aorta: The aortic root is normal in size and structure. Venous: The inferior vena cava was not well visualized. IAS/Shunts: No atrial level shunt detected by color flow Doppler.  LEFT VENTRICLE PLAX 2D LVIDd:         3.50 cm LVIDs:         2.70 cm LV PW:         1.10 cm LV IVS:        1.10 cm LVOT diam:     1.80 cm LV SV:         27 LV SV  Index:   14 LVOT Area:     2.54 cm  LEFT ATRIUM  Index       RIGHT ATRIUM           Index LA diam:        3.90 cm 2.03 cm/m  RA Area:     17.60 cm LA Vol (A2C):   36.7 ml 19.09 ml/m RA Volume:   47.50 ml  24.71 ml/m LA Vol (A4C):   48.1 ml 25.02 ml/m LA Biplane Vol: 42.8 ml 22.26 ml/m  AORTIC VALVE LVOT Vmax:   67.50 cm/s LVOT Vmean:  50.700 cm/s LVOT VTI:    0.106 m  AORTA Ao Root diam: 2.80 cm MITRAL VALVE                TRICUSPID VALVE MV Area (PHT): 2.80 cm     TR Peak grad:   28.9 mmHg MV Decel Time: 271 msec     TR Vmax:        269.00 cm/s MV E velocity: 118.00 cm/s                             SHUNTS                             Systemic VTI:  0.11 m                             Systemic Diam: 1.80 cm Fransico Him MD Electronically signed by Fransico Him MD Signature Date/Time: 10/15/2019/12:49:03 PM    Final    IR ABDOMEN US LIMITED  Result Date: 10/15/2019 CLINICAL DATA:  86 year old female with a history ascites referred for possible paracentesis EXAM: LIMITED ABDOMEN ULTRASOUND FOR ASCITES TECHNIQUE: Limited ultrasound survey for ascites was performed in all four abdominal quadrants. COMPARISON:  None. FINDINGS: Limited ultrasound demonstrates scant ascites with no safe window for access. Paracentesis not performed. IMPRESSION: Scant ascites on ultrasound survey Electronically Signed   By: Corrie Mckusick D.O.   On: 10/15/2019 12:55   US Abdomen Limited RUQ  Result Date: 10/15/2019 CLINICAL DATA:  Cirrhosis, ascites, epigastric pain EXAM: ULTRASOUND ABDOMEN LIMITED RIGHT UPPER QUADRANT COMPARISON:  10/14/2019, 07/03/2019 FINDINGS: Gallbladder: Surgically absent Common bile duct: Diameter: 6 mm Liver: Liver is heterogeneous in echotexture with nodular contour compatible with cirrhosis. No focal hepatic abnormality. No intrahepatic biliary duct dilation. Portal vein is patent on color Doppler imaging with normal direction of blood flow towards the liver. Other: Ascites is seen within  the bilateral upper quadrants, with trace fluid in the right lower quadrant. IMPRESSION: 1. Cirrhosis. 2. Upper abdominal ascites. 3. Cholecystectomy. Electronically Signed   By: Randa Ngo M.D.   On: 10/15/2019 02:19    Cardiac Studies   TTE 10/15/19: 1. Left ventricular ejection fraction, by estimation, is 55 to 60%. The  left ventricle has normal function. The left ventricle has no regional  wall motion abnormalities. Unable to assess LV diastolic filling due ot  underlying atrial fibrillation.  2. Right ventricular systolic function is normal. The right ventricular  size is mildly enlarged.  3. The mitral valve is degenerative. There is mild thickening of the  mitral valve leaflet(s). There is mild calcification of the mitral valve  leaflet(s). Normal mobility of the mitral valve leaflets. Moderate mitral  annular calcification. Mild mitral  valve regurgitation. Mild mitral valve regurgitation. No evidence of  mitral stenosis.  4. The aortic valve is normal in  structure and function. Aortic valve  regurgitation is not visualized. No aortic stenosis is present.   Patient Profile     86 y.o. female with a hx of CAD s/p DES RCA and later the of LAD, COPD/ ILD, chronic combined CHF, morbid obesity, DM2, SVT, nonalcoholic cirrhosis, OSA not on CPAP, hypothyroidism, Paroxysmal afib not on anticoagulation who is being seen today for the evaluation of Afib RVR   Assessment & Plan    Acute on chronic respiratory failure: Multifactorial in the setting of COPD and possibly HFpEF contributing.  CTA chest negative for PE - Hold IV lasix in setting of worsening renal function with diuresis.  Can restart lasix as renal function normalized  COPD exacerbation: started on steroids and albuterol inhaler  Paroxysmal Afib: Due to chronic thrombocytopenia, liver cirrhosis, and history of GI bleeds she has not been considered a good candidate for anticoagulation.  Presented with Afib with RVR   - On metoprolol 12.5 mg BID.  Started on IV dilt.  Given no anticoagulation, rate control is only option at this time, will continue IV diltiazem, can wean as tolerated.  Expect rates to improve as recovers from acute illness.  Can titrate up metoprolol to help wean off gtt  Liver cirrhosis: paracentesis ordered but only trace amount of fluid by [place].  - On spiro 100 mg daily.  Dose given this morning, would hold for now given bump in Cr and to allow more room on BP for rate controlling agents.  Can restart as renal function normalizes  Chronic combined HF:  Echo 09/2018 showed EF 89-16%, diastolic dysfunction. Echo this admission EF 55-60%.   BNP 243. Difficult to assess volume status, but appears euvolemic.   - Worsening renal function with IV lasix, would hold lasix/aldactone for now, can restart as renal function improves - Monitor I/Os, daily weights, creatinine  Thrombocytopenia: platelets down to 56 this admission, improved to 90 today  CAD: Cath in 2010 showed patent stents and nonobstructive disease.  HS troponin negative x 2  DM2 - SSI per IM  HTN - on metoprolol 12.59m mg daily and spironolactone 100 mg daily at baseline>>continued.  Would hold spironolactone for now as above   For questions or updates, please contact CTrimontPlease consult [email or web address] for contact info under       Signed, CDonato Heinz MD  10/16/2019, 10:20 AM

## 2019-10-16 NOTE — Progress Notes (Signed)
Inpatient Diabetes Program Recommendations  AACE/ADA: New Consensus Statement on Inpatient Glycemic Control (2015)  Target Ranges:  Prepandial:   less than 140 mg/dL      Peak postprandial:   less than 180 mg/dL (1-2 hours)      Critically ill patients:  140 - 180 mg/dL   Lab Results  Component Value Date   GLUCAP 271 (H) 10/16/2019   HGBA1C 7.9 (H) 10/15/2019    Review of Glycemic Control Results for [name], [name] (MRN [id number]) as of 10/16/2019 13:00  Ref. Range 10/15/2019 20:40 10/16/2019 00:44 10/16/2019 04:28 10/16/2019 07:23 10/16/2019 12:12  Glucose-Capillary Latest Ref Range: 70 - 99 mg/dL 385 (H) 325 (H) 283 (H) 260 (H) 271 (H)   Diabetes history: DM2 Outpatient Diabetes medications: Lantus 40 units bid (patient varied 20-60 units based on CBGs) Current orders for Inpatient glycemic control: Lantus 20 units + Novolog sensitive correction q 4 hrs.  Inpatient Diabetes Program Recommendations:   -Increase Lantus to 20 units bid (50% home basal dose)  Thank you, Bethena Roys E. Aneita Kiger, RN, MSN, CDE  Diabetes Coordinator Inpatient Glycemic Control Team Team Pager [phone] (8am-5pm) 10/16/2019 1:00 PM

## 2019-10-16 NOTE — Progress Notes (Signed)
OT Cancellation Note  Patient Details Name: [name] MRN: [id number] DOB: 10/08/1933   Cancelled Treatment:    Reason Eval/Treat Not Completed: Medical issues which prohibited therapy. Pt with HR in 160s while eating breakfast, in Afib and having PVCs.   Evern Bio 10/16/2019, 9:31 AM  Martie Round, OTR/L Acute Rehabilitation Services Pager: [phone] Office: [phone]

## 2019-10-16 NOTE — Progress Notes (Signed)
   Vital Signs MEWS/VS Documentation      10/16/2019 1412 10/16/2019 1516 10/16/2019 1518 10/16/2019 1628   MEWS Score:  3  0  0  0   MEWS Score Color:  Yellow  Green  Green  Green   Resp:  18  19  --  20   Pulse:  --  --  90  --   BP:  --  103/61  --  (!) 109/42   Temp:  97.9 F (36.6 C)  --  --  97.6 F (36.4 C)   O2 Flow Rate (L/min):  3 L/min  3 L/min  --  --   Level of Consciousness:  Alert  Alert  --  --          [name]

## 2019-10-16 NOTE — Progress Notes (Signed)
PT Cancellation Note  Patient Details Name: [name] MRN: [id number] DOB: November 20, 1933   Cancelled Treatment:    Reason Eval/Treat Not Completed: Medical issues which prohibited therapy, pt having tachycardia (160s) with PVCs. Will follow-up for PT treatment.  Mabeline Caras, PT, DPT Acute Rehabilitation Services  Pager [phone] Office [phone]  Derry Lory 10/16/2019, 9:30 AM

## 2019-10-17 DIAGNOSIS — I4891 Unspecified atrial fibrillation: Secondary | ICD-10-CM

## 2019-10-17 DIAGNOSIS — I5033 Acute on chronic diastolic (congestive) heart failure: Secondary | ICD-10-CM

## 2019-10-17 LAB — BASIC METABOLIC PANEL
Anion gap: 7 (ref 5–15)
BUN: 27 mg/dL — ABNORMAL HIGH (ref 8–23)
CO2: 28 mmol/L (ref 22–32)
Calcium: 9 mg/dL (ref 8.9–10.3)
Chloride: 98 mmol/L (ref 98–111)
Creatinine, Ser: 1.14 mg/dL — ABNORMAL HIGH (ref 0.44–1.00)
GFR calc Af Amer: 51 mL/min — ABNORMAL LOW (ref >60)
GFR calc non Af Amer: 44 mL/min — ABNORMAL LOW (ref >60)
Glucose, Bld: 230 mg/dL — ABNORMAL HIGH (ref 70–99)
Potassium: 4.8 mmol/L (ref 3.5–5.1)
Sodium: 133 mmol/L — ABNORMAL LOW (ref 135–145)

## 2019-10-17 LAB — GLUCOSE, CAPILLARY
Glucose-Capillary: 147 mg/dL — ABNORMAL HIGH (ref 70–99)
Glucose-Capillary: 188 mg/dL — ABNORMAL HIGH (ref 70–99)
Glucose-Capillary: 206 mg/dL — ABNORMAL HIGH (ref 70–99)
Glucose-Capillary: 210 mg/dL — ABNORMAL HIGH (ref 70–99)
Glucose-Capillary: 225 mg/dL — ABNORMAL HIGH (ref 70–99)
Glucose-Capillary: 274 mg/dL — ABNORMAL HIGH (ref 70–99)

## 2019-10-17 MED ORDER — METOPROLOL TARTRATE 50 MG PO TABS
50.0000 mg | ORAL_TABLET | Freq: Two times a day (BID) | ORAL | Status: DC
  Administered 2019-10-17 – 2019-10-23 (×11): 50 mg via ORAL
  Filled 2019-10-17 (×13): qty 1

## 2019-10-17 MED ORDER — GUAIFENESIN-DM 100-10 MG/5ML PO SYRP
5.0000 mL | ORAL_SOLUTION | ORAL | Status: DC | PRN
  Administered 2019-10-17: 5 mL via ORAL
  Filled 2019-10-17: qty 5

## 2019-10-17 MED ORDER — INSULIN GLARGINE 100 UNIT/ML ~~LOC~~ SOLN
10.0000 [IU] | Freq: Once | SUBCUTANEOUS | Status: AC
  Administered 2019-10-17: 10 [IU] via SUBCUTANEOUS
  Filled 2019-10-17 (×2): qty 0.1

## 2019-10-17 MED ORDER — INSULIN GLARGINE 100 UNIT/ML ~~LOC~~ SOLN
30.0000 [IU] | Freq: Two times a day (BID) | SUBCUTANEOUS | Status: DC
  Administered 2019-10-17 – 2019-10-20 (×6): 30 [IU] via SUBCUTANEOUS
  Filled 2019-10-17 (×7): qty 0.3

## 2019-10-17 NOTE — Evaluation (Addendum)
Occupational Therapy Evaluation Patient Details Name: [name] MRN: [id number] DOB: August 02, 1934 Today's Date: 10/17/2019    History of Present Illness 86 y.o. female admitted on 10/14/19 for SOBDx with hypoxic respiratory failure due to cardiogenic pulmonary edema, diastolic heart failure exacerbation complicated by A-fib with RVR.  COVID 19 (-).  Pt with significant PMH of MI, LE edema, DM, CAD, non-alcoholic cirrhosis.     Clinical Impression   This 86 y/o female presents with the above. PTA pt reports independence with ADL and functional mobility. Pt currently completing room level mobility using RW and standing grooming ADL with overall minguard assist, requiring minA for LB ADL. Pt on 3L O2 with SpO2 >/=89%, HR fluctuating with max HR noted 136. Pt with audible wheezing most notably with transitioning to/from supine in bed, improved during seated/standing activity. Pt lives alone but reports her sons/daughter live very close by and can stay initially if needed. She will benefit from continued acute OT services and currently recommend follow up [facility] services to progress pt towards her PLOF. Will follow.     Follow Up Recommendations  Home health OT;Supervision/Assistance - 24 hour(24hr initially)    Equipment Recommendations  None recommended by OT           Precautions / Restrictions Precautions Precautions: Fall;Other (comment) Precaution Comments: monitor HR (A-fib with RVR) Restrictions Weight Bearing Restrictions: No      Mobility Bed Mobility Overal bed mobility: Needs Assistance Bed Mobility: Supine to Sit;Sit to Supine     Supine to sit: Min guard Sit to supine: Min guard   General bed mobility comments: close guarding for safety/lines, increased effort to get LEs onto EOB  Transfers Overall transfer level: Needs assistance Equipment used: Rolling walker (2 wheeled) Transfers: Sit to/from Stand Sit to Stand: Min guard         General transfer  comment: Min guard assist for safety, VCs for safe hand placement    Balance Overall balance assessment: Needs assistance Sitting-balance support: Feet supported;No upper extremity supported Sitting balance-Leahy Scale: Good     Standing balance support: Bilateral upper extremity supported;Single extremity supported Standing balance-Leahy Scale: Fair                             ADL either performed or assessed with clinical judgement   ADL Overall ADL's : Needs assistance/impaired Eating/Feeding: Modified independent;Sitting   Grooming: Oral care;Min guard;Standing   Upper Body Bathing: Min guard;Set up;Sitting   Lower Body Bathing: Minimal assistance;Sit to/from stand   Upper Body Dressing : Set up;Sitting   Lower Body Dressing: Minimal assistance;Sit to/from stand   Toilet Transfer: Min guard;Ambulation;RW Toilet Transfer Details (indicate cue type and reason): simulated via transfer to/from EOB, room level mobility  Toileting- Clothing Manipulation and Hygiene: Minimal assistance;Sit to/from stand       Functional mobility during ADLs: Min guard;Rolling walker        Baseline Vision/History: 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 ) Additional Comments: pt able to navigate in room and locate ADL items without sig difficulty                 Pertinent Vitals/Pain       Hand Dominance Right   Extremity/Trunk Assessment Upper Extremity Assessment Upper Extremity Assessment: Generalized weakness   Lower Extremity Assessment Lower Extremity Assessment: Defer to PT evaluation   Cervical / Trunk Assessment Cervical / Trunk Assessment: Normal   Communication Communication Communication: [facility]  Cognition Arousal/Alertness: Awake/alert Behavior During Therapy: WFL for tasks assessed/performed Overall Cognitive Status: Within Functional Limits for tasks assessed                                 General Comments: HOH   General  Comments       Exercises     Shoulder Instructions      Home Living Family/patient expects to be discharged to:: Private residence Living Arrangements: Alone Available Help at Discharge: Family;Available 24 hours/day(sons can stay with her if needed at d/c) Type of Home: House Home Access: Ramped entrance     Home Layout: One level     Bathroom Shower/Tub: Occupational psychologist: Standard     Home Equipment: Environmental consultant - 4 wheels;Cane - quad;Cane - single point;Shower seat;Grab bars - tub/shower          Prior Functioning/Environment Level of Independence: Independent        Comments: drives short distances, but not at night, and reports she needs to get new glasses.  On 2 L O2 [place] at home at baseline.         OT Problem List: Decreased strength;Decreased range of motion;Decreased activity tolerance;Impaired balance (sitting and/or standing);Decreased knowledge of use of DME or AE;Cardiopulmonary status limiting activity      OT Treatment/Interventions: Self-care/ADL training;Therapeutic exercise;Energy conservation;DME and/or AE instruction;Therapeutic activities;Patient/family education;Balance training    OT Goals(Current goals can be found in the care plan section) Acute Rehab OT Goals Patient Stated Goal: to get stronger and go home.  OT Goal Formulation: With patient Time For Goal Achievement: 10/31/19 Potential to Achieve Goals: Good  OT Frequency: Min 2X/week   Barriers to D/C:            Co-evaluation              AM-PAC OT "6 Clicks" Daily Activity     Outcome Measure Help from another person eating meals?: None Help from another person taking care of personal grooming?: A Little Help from another person toileting, which includes using toliet, bedpan, or urinal?: A Little Help from another person bathing (including washing, rinsing, drying)?: A Little Help from another person to put on and taking off regular upper body clothing?:  None Help from another person to put on and taking off regular lower body clothing?: A Little 6 Click Score: 20   End of Session Equipment Utilized During Treatment: Gait belt;Rolling walker;Oxygen Nurse Communication: Mobility status  Activity Tolerance: Patient tolerated treatment well Patient left: in bed;with call bell/phone within reach;with bed alarm set  OT Visit Diagnosis: Muscle weakness (generalized) (M62.81);Unsteadiness on feet (R26.81)                Time: 8101-7510 OT Time Calculation (min): 26 min Charges:  OT General Charges $OT Visit: 1 Visit OT Evaluation $OT Eval Moderate Complexity: 1 Mod OT Treatments $Self Care/Home Management : 8-22 mins  Lou Cal, OT Supplemental Rehabilitation Services Pager [phone] Office [phone]   Raymondo Band 10/17/2019, 2:06 PM

## 2019-10-17 NOTE — Progress Notes (Addendum)
PROGRESS NOTE    [name]  WER:[id number] DOB: 1933-10-17 DOA: 10/14/2019 PCP: Mellody Dance, DO    Brief Narrative:  Patient admitted to the hospital with the working diagnosis of acute hypoxic respiratory failure due to cardiogenic pulmonary edema, diastolic heart failure exacerbation complicatedatrial fibrillationwith rapid ventricular responseandworsening ascites.  86 year old female who presented with dyspnea, she does have significant past medical history for COPD, chronic hypoxic respiratory failure, type 2 diabetes mellitus, [place], diastolic heart failure and obstructive sleep apnea. Patient reported the last few days had noticed increased abdominal distention, edema and weight gain. Positive dyspnea and wheezing. 1 week prior to hospitalization she was seen in the emergency department, noted a20 pound weight gain,butno significant ascites. On her initial physical examination blood pressure 100/61, pulse rate 86, respiratory rate14, temperature 98.3, oxygen saturation 99%with supplemental oxygen.Heart S1-S2 present, no gallops,rubs ormurmurs. Lungs with wheezing and ralesbilaterally, abdomen distended, positive lower extremity edema. Sodium 136, potassium 3.7, chloride 101, bicarb 26, glucose 285,BUN 8, creatinine 0.92,white count 3.9, hemoglobin 13.2, hematocrit 40.9, platelets 75.SARS COVID-19 negative. Chest radiograph without increased hilar vascular congestion, right base atelectasis. CT chest negative for pulmonary embolism. Positive cirrhosis with moderate to large volume abdominal ascites.EKG 123 bpm, normal axis, normal QRS, atrial fibrillation rhythm, no ST segment or T wave changes.  Patient has been placed on diltiazem drip for rate control, IV diuresis with furosemide and requested paracentesis.  Not enough fluid for paracentesis, diuresis on hold due to worsening renal function.   Improved rate control, now off diltiazem drip.     Assessment & Plan:   Principal Problem:   Acute on chronic diastolic CHF (congestive heart failure) (HCC) Active Problems:   Coronary artery disease   Thrombocytopenia, unspecified (HCC)   Hypothyroidism   Morbidly obese (HCC)- BMI >er 35 w DM   Gastroesophageal reflux disease   COPD exacerbation (HCC)   Acute on chronic respiratory failure with hypoxia (HCC)   Liver cirrhosis secondary to NASH (HCC)   Paroxysmal atrial fibrillation (HCC)   Ascites of liver   Portal hypertension (HCC)   COPD with acute exacerbation (HCC)   Atrial fibrillation with RVR ([place])   1. Acute hypoxic respiratory failure due to acute decompensation of chronic diastolic heart failure, in the setting of paroxysmal atrial fibrillation with RVR. Telemetry personally reviewed noted HR in the 100 at rest up to 120 on exertion. Now off diltiazem drip. Patient clinically euvolemic. Echocardiogram with LV systolic function 55 to 19%  Increase metoprolol to 50 mg po bid, continue telemetry monitoring, not on anticoagulation due to chronic thrombocytopenia. OK to transfer to telemetry.   2. AKI with non gap metabolic acidosis Continue to trend down serum cr today down to 1,14 with K at 487 and serum bicarbonate at 28. Will continue to follow up on renal function in am, avoid hypotension and nephrotoxic medications.   3. Cirrhosis due to NASH/ thrombocytopenia. No encephalopathy or coagulopathy.  Last platelet 90,000.    4, CAD. Stable. Chest pain free.   5. Hypothyroid. Continue with levothyroxine.  6. Morbid obesity. Calculated BMI more than 35, will need home health services at discharge  7. COPD with chronic hypoxic respiratory failure. No clinical signs of acute exacerbation. Continue bronchodilators and inhaled steroids, patient on home 02 2 L/ min per [place].  8. Uncontrolled T2DM ()Hgb A1c 7,9).  Fasting glucose up to 230, capillary 206. Basal insulin at home is 40 units bid of glargine, will  increase to 30 units now and will  continue sliding scale for glucose cover and monitoring.  DVT prophylaxis:enoxaparin Code Status:full Family Communication:I spoke over the phone with the patient's daughter about patient's  condition, plan of care, prognosis and all questions were addressed. Disposition Plan/ discharge barriers:possible dc in am, if heart rate controlled.    Consultants:   Cardiology    Subjective: Patient feeling better, but not yet back to baseline, continue to have dyspnea on exertion. No chest pain or lower extremity edema. No nausea or vomiting. Now off diltiazem drip.   Objective: Vitals:   10/17/19 0357 10/17/19 0400 10/17/19 0500 10/17/19 0842  BP: 105/65 108/63  114/82  Pulse: (!) 112     Resp: 18 (!) 21 17   Temp: 97.8 F (36.6 C)     TempSrc: Oral     SpO2: 97% 96% 97%   Weight: 86.7 kg     Height:        Intake/Output Summary (Last 24 hours) at 10/17/2019 0911 Last data filed at 10/17/2019 6222 Gross per 24 hour  Intake 3 ml  Output 550 ml  Net -547 ml   Filed Weights   10/14/19 1922 10/17/19 0357  Weight: (!) 182 kg 86.7 kg    Examination:   General: deconditioned  Neurology: Awake and alert, non focal  E ENT: no pallor, no icterus, oral mucosa moist Cardiovascular: No JVD. S1-S2 present, irregularly irregular, tahcycardic, no gallops, rubs, or murmurs. Trace lower extremity edema. Pulmonary: vesicular breath sounds bilaterally, adequate air movement, no wheezing, rhonchi or rales. Gastrointestinal. Abdomen with no organomegaly, non tender, no rebound or guarding Skin. No rashes Musculoskeletal: no joint deformities     Data Reviewed: I have personally reviewed following labs and imaging studies  CBC: Recent Labs  Lab 10/14/19 1940 10/15/19 0520 10/16/19 0553  WBC 3.9* 2.8* 8.2  NEUTROABS 2.3  --  6.7  HGB 13.2 13.4 12.5  HCT 40.9 42.0 38.6  MCV 95.3 95.0 95.1  PLT 75* 59* 90*   Basic Metabolic Panel: Recent  Labs  Lab 10/14/19 1940 10/15/19 0520 10/16/19 0252 10/17/19 0238  NA 136 137 132* 133*  K 3.7 4.0 4.7 4.8  CL 101 101 98 98  CO2 26 25 22 28   GLUCOSE 285* 174* 333* 230*  BUN 8 8 23  27*  CREATININE 0.92 0.90 1.34* 1.14*  CALCIUM 8.3* 8.7* 8.5* 9.0  MG  --  1.7  --   --   PHOS  --  3.1  --   --    GFR: Estimated Creatinine Clearance: 41.5 mL/min (A) (by C-G formula based on SCr of 1.14 mg/dL (H)). Liver Function Tests: Recent Labs  Lab 10/14/19 1940 10/15/19 0520  AST 33 29  ALT 19 20  ALKPHOS 159* 154*  BILITOT 1.1 1.4*  PROT 6.2* 6.6  ALBUMIN 2.7* 3.2*   No results for input(s): LIPASE, AMYLASE in the last 168 hours. Recent Labs  Lab 10/15/19 0154  AMMONIA 44*   Coagulation Profile: Recent Labs  Lab 10/15/19 0154  INR 1.2   Cardiac Enzymes: No results for input(s): CKTOTAL, CKMB, CKMBINDEX, TROPONINI in the last 168 hours. BNP (last 3 results) No results for input(s): PROBNP in the last 8760 hours. HbA1C: Recent Labs    10/15/19 0520  HGBA1C 7.9*   CBG: Recent Labs  Lab 10/16/19 1625 10/16/19 1944 10/16/19 2352 10/17/19 0355 10/17/19 0846  GLUCAP 334* 293* 274* 210* 147*   Lipid Profile: No results for input(s): CHOL, HDL, LDLCALC, TRIG, CHOLHDL, LDLDIRECT in the last  72 hours. Thyroid Function Tests: Recent Labs    10/15/19 0520  TSH 1.662   Anemia Panel: No results for input(s): VITAMINB12, FOLATE, FERRITIN, TIBC, IRON, RETICCTPCT in the last 72 hours.    Radiology Studies: I have reviewed all of the imaging during this hospital visit personally     Scheduled Meds: . brimonidine  1 drop Both Eyes Daily  . budesonide (PULMICORT) nebulizer solution  2 mg Nebulization BID  . cycloSPORINE  1 drop Both Eyes BID  . escitalopram  20 mg Oral Daily  . hydroxypropyl methylcellulose / hypromellose  1 drop Both Eyes BID  . insulin aspart  0-9 Units Subcutaneous Q4H  . insulin glargine  20 Units Subcutaneous BID  . levothyroxine  100  mcg Oral Daily  . metoprolol tartrate  25 mg Oral BID  . mometasone-formoterol  1 puff Inhalation BID  . montelukast  10 mg Oral QPM  . pantoprazole  20 mg Oral BID  . sodium chloride flush  3 mL Intravenous Q12H   Continuous Infusions: . sodium chloride    . diltiazem (CARDIZEM) infusion Stopped (10/16/19 1419)     LOS: 3 days        Jesscia Imm Gerome Apley, MD

## 2019-10-17 NOTE — Progress Notes (Signed)
Progress Note  Patient Name: [name] Date of Encounter: 10/17/2019  Primary Cardiologist:   Mertie Moores, MD   Subjective   She is still SOB and not breathing at baseline.  No chest pain.  Feels palpitations.   Inpatient Medications    Scheduled Meds: . brimonidine  1 drop Both Eyes Daily  . cycloSPORINE  1 drop Both Eyes BID  . escitalopram  20 mg Oral Daily  . hydroxypropyl methylcellulose / hypromellose  1 drop Both Eyes BID  . insulin aspart  0-9 Units Subcutaneous Q4H  . insulin glargine  20 Units Subcutaneous BID  . levothyroxine  100 mcg Oral Daily  . metoprolol tartrate  50 mg Oral BID  . mometasone-formoterol  1 puff Inhalation BID  . montelukast  10 mg Oral QPM  . pantoprazole  20 mg Oral BID  . sodium chloride flush  3 mL Intravenous Q12H   Continuous Infusions: . sodium chloride     PRN Meds: sodium chloride, acetaminophen **OR** acetaminophen, albuterol, HYDROcodone-acetaminophen, levalbuterol, ondansetron **OR** ondansetron (ZOFRAN) IV, sodium chloride flush   Vital Signs    Vitals:   10/17/19 0400 10/17/19 0500 10/17/19 0842 10/17/19 0933  BP: 108/63  114/82   Pulse:      Resp: (!) 21 17    Temp:      TempSrc:      SpO2: 96% 97%  97%  Weight:      Height:        Intake/Output Summary (Last 24 hours) at 10/17/2019 0954 Last data filed at 10/17/2019 2500 Gross per 24 hour  Intake 3 ml  Output 550 ml  Net -547 ml   Filed Weights   10/14/19 1922 10/17/19 0357  Weight: (!) 182 kg 86.7 kg    Telemetry    Atrial fib with rapid rate - Personally Reviewed  ECG    NA - Personally Reviewed  Physical Exam   GEN: No acute distress.   Neck: No  JVD Cardiac: Irregular RR, no murmurs, rubs, or gallops.  Respiratory: Clear  to auscultation bilaterally. GI: Soft, nontender, non-distended  MS: No  edema; No deformity. Neuro:  Nonfocal  Psych: Normal affect   Labs    Chemistry Recent Labs  Lab 10/14/19 1940 10/14/19 1940  10/15/19 0520 10/16/19 0252 10/17/19 0238  NA 136   < > 137 132* 133*  K 3.7   < > 4.0 4.7 4.8  CL 101   < > 101 98 98  CO2 26   < > 25 22 28   GLUCOSE 285*   < > 174* 333* 230*  BUN 8   < > 8 23 27*  CREATININE 0.92   < > 0.90 1.34* 1.14*  CALCIUM 8.3*   < > 8.7* 8.5* 9.0  PROT 6.2*  --  6.6  --   --   ALBUMIN 2.7*  --  3.2*  --   --   AST 33  --  29  --   --   ALT 19  --  20  --   --   ALKPHOS 159*  --  154*  --   --   BILITOT 1.1  --  1.4*  --   --   GFRNONAA 58*   < > 59* 37* 44*  GFRAA >60   < > >60 42* 51*  ANIONGAP 9   < > 11 12 7    < > = values in this interval not displayed.  Hematology Recent Labs  Lab 10/14/19 1940 10/15/19 0520 10/16/19 0553  WBC 3.9* 2.8* 8.2  RBC 4.29 4.42 4.06  HGB 13.2 13.4 12.5  HCT 40.9 42.0 38.6  MCV 95.3 95.0 95.1  MCH 30.8 30.3 30.8  MCHC 32.3 31.9 32.4  RDW 14.1 14.1 14.1  PLT 75* 59* 90*    Cardiac EnzymesNo results for input(s): TROPONINI in the last 168 hours. No results for input(s): TROPIPOC in the last 168 hours.   BNP Recent Labs  Lab 10/14/19 1944  BNP 243.2*     DDimer No results for input(s): DDIMER in the last 168 hours.   Radiology    ECHOCARDIOGRAM COMPLETE  Result Date: 10/15/2019    ECHOCARDIOGRAM REPORT   Patient Name:   WINDA SUMMERALL [facility] Date of Exam: 10/15/2019 Medical Rec #:  [id number]        Height:       66.0 in Accession #:    [id number]       Weight:       182.2 lb Date of Birth:  09-01-1933        BSA:          1.922 m Patient Age:    10 years         BP:           116/61 mmHg Patient Gender: F                HR:           124 bpm. Exam Location:  Inpatient Procedure: 2D Echo Indications:    CHF 428  History:        Patient has prior history of Echocardiogram examinations, most                 recent 09/27/2018. Previous Myocardial Infarction and CAD; Risk                 Factors:Dyslipidemia, Diabetes and Former Smoker.  Sonographer:    Jannett Celestine RDCS (AE) Referring Phys: [place]   Sonographer Comments: limited mobility IMPRESSIONS  1. Left ventricular ejection fraction, by estimation, is 55 to 60%. The left ventricle has normal function. The left ventricle has no regional wall motion abnormalities. Unable to assess LV diastolic filling due ot underlying atrial fibrillation.  2. Right ventricular systolic function is normal. The right ventricular size is mildly enlarged.  3. The mitral valve is degenerative. There is mild thickening of the mitral valve leaflet(s). There is mild calcification of the mitral valve leaflet(s). Normal mobility of the mitral valve leaflets. Moderate mitral annular calcification. Mild mitral valve regurgitation. Mild mitral valve regurgitation. No evidence of mitral stenosis.  4. The aortic valve is normal in structure and function. Aortic valve regurgitation is not visualized. No aortic stenosis is present. FINDINGS  Left Ventricle: Left ventricular ejection fraction, by estimation, is 55 to 60%. The left ventricle has normal function. The left ventricle has no regional wall motion abnormalities. The left ventricular internal cavity size was normal in size. There is  no left ventricular hypertrophy. Unable to assess LV diastolic filling due ot underlying atrial fibrillation. Right Ventricle: The right ventricular size is mildly enlarged. No increase in right ventricular wall thickness. Right ventricular systolic function is normal. Left Atrium: Left atrial size was normal in size. Right Atrium: Right atrial size was normal in size. Pericardium: There is no evidence of pericardial effusion. Mitral Valve: The mitral valve is degenerative in appearance. There is mild thickening  of the mitral valve leaflet(s). There is mild calcification of the mitral valve leaflet(s). Normal mobility of the mitral valve leaflets. Moderate mitral annular calcification. Mild mitral valve regurgitation. No evidence of mitral valve stenosis. Tricuspid Valve: The tricuspid valve is normal  in structure. Tricuspid valve regurgitation is not demonstrated. No evidence of tricuspid stenosis. Aortic Valve: The aortic valve is normal in structure and function. Aortic valve regurgitation is not visualized. No aortic stenosis is present. Pulmonic Valve: The pulmonic valve was normal in structure. Pulmonic valve regurgitation is trivial. No evidence of pulmonic stenosis. Aorta: The aortic root is normal in size and structure. Venous: The inferior vena cava was not well visualized. IAS/Shunts: No atrial level shunt detected by color flow Doppler.  LEFT VENTRICLE PLAX 2D LVIDd:         3.50 cm LVIDs:         2.70 cm LV PW:         1.10 cm LV IVS:        1.10 cm LVOT diam:     1.80 cm LV SV:         27 LV SV Index:   14 LVOT Area:     2.54 cm  LEFT ATRIUM             Index       RIGHT ATRIUM           Index LA diam:        3.90 cm 2.03 cm/m  RA Area:     17.60 cm LA Vol (A2C):   36.7 ml 19.09 ml/m RA Volume:   47.50 ml  24.71 ml/m LA Vol (A4C):   48.1 ml 25.02 ml/m LA Biplane Vol: 42.8 ml 22.26 ml/m  AORTIC VALVE LVOT Vmax:   67.50 cm/s LVOT Vmean:  50.700 cm/s LVOT VTI:    0.106 m  AORTA Ao Root diam: 2.80 cm MITRAL VALVE                TRICUSPID VALVE MV Area (PHT): 2.80 cm     TR Peak grad:   28.9 mmHg MV Decel Time: 271 msec     TR Vmax:        269.00 cm/s MV E velocity: 118.00 cm/s                             SHUNTS                             Systemic VTI:  0.11 m                             Systemic Diam: 1.80 cm Fransico Him MD Electronically signed by Fransico Him MD Signature Date/Time: 10/15/2019/12:49:03 PM    Final    IR ABDOMEN US LIMITED  Result Date: 10/15/2019 CLINICAL DATA:  86 year old female with a history ascites referred for possible paracentesis EXAM: LIMITED ABDOMEN ULTRASOUND FOR ASCITES TECHNIQUE: Limited ultrasound survey for ascites was performed in all four abdominal quadrants. COMPARISON:  None. FINDINGS: Limited ultrasound demonstrates scant ascites with no safe window  for access. Paracentesis not performed. IMPRESSION: Scant ascites on ultrasound survey Electronically Signed   By: Corrie Mckusick D.O.   On: 10/15/2019 12:55    Cardiac Studies   ECHO:  TTE 10/15/19: 1. Left ventricular ejection fraction, by estimation, is  55 to 60%. The  left ventricle has normal function. The left ventricle has no regional  wall motion abnormalities. Unable to assess LV diastolic filling due ot  underlying atrial fibrillation.  2. Right ventricular systolic function is normal. The right ventricular  size is mildly enlarged.  3. The mitral valve is degenerative. There is mild thickening of the  mitral valve leaflet(s). There is mild calcification of the mitral valve  leaflet(s). Normal mobility of the mitral valve leaflets. Moderate mitral  annular calcification. Mild mitral  valve regurgitation. Mild mitral valve regurgitation. No evidence of  mitral stenosis.  4. The aortic valve is normal in structure and function. Aortic valve  regurgitation is not visualized. No aortic stenosis is present.    Patient Profile     86 y.o. female with a hx of CAD s/p DES RCA and latertheof LAD, COPD/ILD, chronic combined CHF, morbid obesity, DM2, SVT, nonalcoholic cirrhosis, OSA not on CPAP, hypothyroidism, Paroxysmal afib not on anticoagulationwho is being seen for the evaluation of Afib RVR   Assessment & Plan    Acute on chronic respiratory failure:   Held Lasix yesterday with worsening renal function.   See below.  I would continue to hold Lasix today.     COPD:  Per primary team  PAF:   Multiple reasons that she cannot have anticoagulation.  Rate is not well controlled but BP is soft.  BP will not allow med titration.  If BP allows will up titrate beta blocker.    CHRONIC DIASTOLIC HF:  Lasix held as above.  However creat is improved today.  I/O seems to be incomplete. Holding diuresis as above.  THROMBOCYTOPENIA:  Platelets yesterday up to 90.  Follow.       For questions or updates, please contact [place] Please consult [email or web address] for contact info under Cardiology/STEMI.   Signed, Minus Breeding, MD  10/17/2019, 9:54 AM

## 2019-10-18 ENCOUNTER — Inpatient Hospital Stay (HOSPITAL_COMMUNITY): Payer: HMO

## 2019-10-18 DIAGNOSIS — K219 Gastro-esophageal reflux disease without esophagitis: Secondary | ICD-10-CM

## 2019-10-18 LAB — BASIC METABOLIC PANEL
Anion gap: 9 (ref 5–15)
BUN: 20 mg/dL (ref 8–23)
CO2: 28 mmol/L (ref 22–32)
Calcium: 9.3 mg/dL (ref 8.9–10.3)
Chloride: 99 mmol/L (ref 98–111)
Creatinine, Ser: 0.95 mg/dL (ref 0.44–1.00)
GFR calc Af Amer: >60 mL/min (ref >60)
GFR calc non Af Amer: 55 mL/min — ABNORMAL LOW (ref >60)
Glucose, Bld: 198 mg/dL — ABNORMAL HIGH (ref 70–99)
Potassium: 5.1 mmol/L (ref 3.5–5.1)
Sodium: 136 mmol/L (ref 135–145)

## 2019-10-18 LAB — GLUCOSE, CAPILLARY
Glucose-Capillary: 101 mg/dL — ABNORMAL HIGH (ref 70–99)
Glucose-Capillary: 213 mg/dL — ABNORMAL HIGH (ref 70–99)
Glucose-Capillary: 226 mg/dL — ABNORMAL HIGH (ref 70–99)
Glucose-Capillary: 265 mg/dL — ABNORMAL HIGH (ref 70–99)
Glucose-Capillary: 265 mg/dL — ABNORMAL HIGH (ref 70–99)
Glucose-Capillary: 301 mg/dL — ABNORMAL HIGH (ref 70–99)
Glucose-Capillary: 79 mg/dL (ref 70–99)

## 2019-10-18 MED ORDER — IPRATROPIUM-ALBUTEROL 0.5-2.5 (3) MG/3ML IN SOLN
3.0000 mL | Freq: Three times a day (TID) | RESPIRATORY_TRACT | Status: DC
  Administered 2019-10-19 – 2019-10-23 (×13): 3 mL via RESPIRATORY_TRACT
  Filled 2019-10-18 (×13): qty 3

## 2019-10-18 MED ORDER — IPRATROPIUM-ALBUTEROL 0.5-2.5 (3) MG/3ML IN SOLN
3.0000 mL | Freq: Four times a day (QID) | RESPIRATORY_TRACT | Status: DC
  Administered 2019-10-18 (×2): 3 mL via RESPIRATORY_TRACT
  Filled 2019-10-18 (×2): qty 3

## 2019-10-18 MED ORDER — ALBUTEROL SULFATE (2.5 MG/3ML) 0.083% IN NEBU: 2.5000 mg | INHALATION_SOLUTION | RESPIRATORY_TRACT | Status: DC | PRN

## 2019-10-18 MED ORDER — RIFAXIMIN 200 MG PO TABS
200.0000 mg | ORAL_TABLET | Freq: Two times a day (BID) | ORAL | Status: DC
  Administered 2019-10-18 – 2019-10-23 (×11): 200 mg via ORAL
  Filled 2019-10-18 (×12): qty 1

## 2019-10-18 MED ORDER — PREDNISONE 20 MG PO TABS
40.0000 mg | ORAL_TABLET | Freq: Two times a day (BID) | ORAL | Status: DC
  Administered 2019-10-18 (×2): 40 mg via ORAL
  Filled 2019-10-18 (×2): qty 2

## 2019-10-18 NOTE — Progress Notes (Signed)
PROGRESS NOTE    [name]  NLZ:[id number] DOB: 1933-10-14 DOA: 10/14/2019 PCP: Mellody Dance, DO    Brief Narrative:  Patient admitted to the hospital with the working diagnosis of acute hypoxic respiratory failure due to cardiogenic pulmonary edema, diastolic heart failure exacerbation complicatedatrial fibrillationwith rapid ventricular responseandworsening ascites/ Acute copd exacerbation.   86 year old female who presented with dyspnea, she does have significant past medical history for COPD, chronic hypoxic respiratory failure, type 2 diabetes mellitus, [place], diastolic heart failure and obstructive sleep apnea. Patient reported the last few days had noticed increased abdominal distention, edema and weight gain. Positive dyspnea and wheezing. 1 week prior to hospitalization she was seen in the emergency department, noted a20 pound weight gain,butno significant ascites. On her initial physical examination blood pressure 100/61, pulse rate 86, respiratory rate14, temperature 98.3, oxygen saturation 99%with supplemental oxygen.Heart S1-S2 present, no gallops,rubs ormurmurs. Lungs with wheezing and ralesbilaterally, abdomen distended, positive lower extremity edema. Sodium 136, potassium 3.7, chloride 101, bicarb 26, glucose 285,BUN 8, creatinine 0.92,white count 3.9, hemoglobin 13.2, hematocrit 40.9, platelets 75.SARS COVID-19 negative. Chest radiograph without increased hilar vascular congestion, right base atelectasis. CT chest negative for pulmonary embolism. Positive cirrhosis with moderate to large volume abdominal ascites.EKG 123 bpm, normal axis, normal QRS, atrial fibrillation rhythm, no ST segment or T wave changes.  Patient has been placed on diltiazem drip for rate control, IV diuresis with furosemide and requested paracentesis.  Not enough fluid for paracentesis, diuresis on hold due to worsening renal function.  Improved rate control with  metoprolol, now off diltiazem drip. Renal function has been improving and her blood pressure has remained stable.     Assessment & Plan:   Principal Problem:   Acute on chronic diastolic CHF (congestive heart failure) (HCC) Active Problems:   Coronary artery disease   Thrombocytopenia, unspecified (HCC)   Hypothyroidism   Morbidly obese (HCC)- BMI >er 35 w DM   Gastroesophageal reflux disease   COPD exacerbation (HCC)   Acute on chronic respiratory failure with hypoxia (HCC)   Liver cirrhosis secondary to NASH (HCC)   Paroxysmal atrial fibrillation (HCC)   Ascites of liver   Portal hypertension (HCC)   COPD with acute exacerbation (HCC)   Atrial fibrillation with RVR ([place])   1. Acute hypoxic respiratory failure due to acute decompensation of chronic diastolic heart failure, in the setting ofparoxysmalatrial fibrillation with RVR.Echocardiogram with LV systolic function 55 to 90%. Improved rate control, continue clinically euvolemic.   Will continue metoprolol to 50 mg po bid with close telemetry and blood pressure monitoring. In the past patient had hypotension with up titration of B blockade. Not on anticoagulation due to thrombocytopenia.  2. COPD exacerbation. This am with diffuse wheezing, will add combivent, continue inhaled corticosteroids and will add systemic steroids with prednisone 40 mg po bid. Continue oxymetry monitoring.   3. AKI with non gap metabolic acidosis Renal function with serum cr down to 0,95 with K at 5,1 and serum, bicarbonate at 29. No clinical signs of hypervolemia, will continue to hold on diuretic therapy for now.  4. Cirrhosis due to NASH/ thrombocytopenia.Noencephalopathy or coagulopathy.  Last platelet 90,000. Per her daughter patient on rifaximin at home.   5, CAD.Stable. No chest pain.   6. Hypothyroid. On Levothyroxine.  7. Morbid obesity. Calculated BMI more than 35,out of bed to chair as tolerated.  8. Uncontrolled T2DM ()Hgb  A1c 7,9).Fasting glucose up to 198, continue with basal insulin 30 units bid with insulin sliding scale, now  with steroids ,may need further up titration of insulin therapy.  DVT prophylaxis:enoxaparin Code Status:full Family Communication:I spoke at the bedside with the patient's daughter about patient's  condition, plan of care, prognosis and all questions were addressed. Disposition Plan/ discharge barriers:patient continue acutely ill   Consultants:   Cardiology      Subjective: This am patient continue to have dyspnea on exertion and new wheezing with cough, no nausea or vomiting, no chest pain or dyspnea.   Objective: Vitals:   10/17/19 2300 10/18/19 0011 10/18/19 0759 10/18/19 0849  BP:  113/72 107/64   Pulse: 91 94 78   Resp: 20 (!) 21 18   Temp:  97.7 F (36.5 C) (!) 97.5 F (36.4 C)   TempSrc:  Oral    SpO2: 97%  99% 98%  Weight:      Height:       No intake or output data in the 24 hours ending 10/18/19 1028 Filed Weights   10/14/19 1922 10/17/19 0357  Weight: (!) 182 kg 86.7 kg    Examination:   General: deconditioned  Neurology: Awake and alert, non focal  E ENT: no pallor, no icterus, oral mucosa moist Cardiovascular: No JVD. S1-S2 present, rhythmic, no gallops, rubs, or murmurs. Trace lower extremity edema. Pulmonary: positive breath sounds bilaterally, decreased air movement, positive bilateral and diffuse wheezing, with no rhonchi or rales. Gastrointestinal. Abdomen protuberant with, no organomegaly, non tender, no rebound or guarding Skin. No rashes Musculoskeletal: no joint deformities     Data Reviewed: I have personally reviewed following labs and imaging studies  CBC: Recent Labs  Lab 10/14/19 1940 10/15/19 0520 10/16/19 0553  WBC 3.9* 2.8* 8.2  NEUTROABS 2.3  --  6.7  HGB 13.2 13.4 12.5  HCT 40.9 42.0 38.6  MCV 95.3 95.0 95.1  PLT 75* 59* 90*   Basic Metabolic Panel: Recent Labs  Lab 10/14/19 1940 10/15/19 0520  10/16/19 0252 10/17/19 0238  NA 136 137 132* 133*  K 3.7 4.0 4.7 4.8  CL 101 101 98 98  CO2 26 25 22 28   GLUCOSE 285* 174* 333* 230*  BUN 8 8 23  27*  CREATININE 0.92 0.90 1.34* 1.14*  CALCIUM 8.3* 8.7* 8.5* 9.0  MG  --  1.7  --   --   PHOS  --  3.1  --   --    GFR: Estimated Creatinine Clearance: 41.5 mL/min (A) (by C-G formula based on SCr of 1.14 mg/dL (H)). Liver Function Tests: Recent Labs  Lab 10/14/19 1940 10/15/19 0520  AST 33 29  ALT 19 20  ALKPHOS 159* 154*  BILITOT 1.1 1.4*  PROT 6.2* 6.6  ALBUMIN 2.7* 3.2*   No results for input(s): LIPASE, AMYLASE in the last 168 hours. Recent Labs  Lab 10/15/19 0154  AMMONIA 44*   Coagulation Profile: Recent Labs  Lab 10/15/19 0154  INR 1.2   Cardiac Enzymes: No results for input(s): CKTOTAL, CKMB, CKMBINDEX, TROPONINI in the last 168 hours. BNP (last 3 results) No results for input(s): PROBNP in the last 8760 hours. HbA1C: No results for input(s): HGBA1C in the last 72 hours. CBG: Recent Labs  Lab 10/17/19 1714 10/17/19 2001 10/18/19 0009 10/18/19 0525 10/18/19 0759  GLUCAP 225* 188* 213* 101* 79   Lipid Profile: No results for input(s): CHOL, HDL, LDLCALC, TRIG, CHOLHDL, LDLDIRECT in the last 72 hours. Thyroid Function Tests: No results for input(s): TSH, T4TOTAL, FREET4, T3FREE, THYROIDAB in the last 72 hours. Anemia Panel: No results for input(s):  VITAMINB12, FOLATE, FERRITIN, TIBC, IRON, RETICCTPCT in the last 72 hours.    Radiology Studies: I have reviewed all of the imaging during this hospital visit personally     Scheduled Meds: . brimonidine  1 drop Both Eyes Daily  . cycloSPORINE  1 drop Both Eyes BID  . escitalopram  20 mg Oral Daily  . hydroxypropyl methylcellulose / hypromellose  1 drop Both Eyes BID  . insulin aspart  0-9 Units Subcutaneous Q4H  . insulin glargine  30 Units Subcutaneous BID  . levothyroxine  100 mcg Oral Daily  . metoprolol tartrate  50 mg Oral BID  .  mometasone-formoterol  1 puff Inhalation BID  . montelukast  10 mg Oral QPM  . pantoprazole  20 mg Oral BID  . sodium chloride flush  3 mL Intravenous Q12H   Continuous Infusions: . sodium chloride       LOS: 4 days        Madison Heupel Gerome Apley, MD

## 2019-10-19 DIAGNOSIS — E669 Obesity, unspecified: Secondary | ICD-10-CM

## 2019-10-19 LAB — GLUCOSE, CAPILLARY
Glucose-Capillary: 192 mg/dL — ABNORMAL HIGH (ref 70–99)
Glucose-Capillary: 227 mg/dL — ABNORMAL HIGH (ref 70–99)
Glucose-Capillary: 230 mg/dL — ABNORMAL HIGH (ref 70–99)
Glucose-Capillary: 238 mg/dL — ABNORMAL HIGH (ref 70–99)
Glucose-Capillary: 290 mg/dL — ABNORMAL HIGH (ref 70–99)
Glucose-Capillary: 357 mg/dL — ABNORMAL HIGH (ref 70–99)

## 2019-10-19 LAB — CBC WITH DIFFERENTIAL/PLATELET
Abs Immature Granulocytes: 0.02 10*3/uL (ref 0.00–0.07)
Basophils Absolute: 0 10*3/uL (ref 0.0–0.1)
Basophils Relative: 0 %
Eosinophils Absolute: 0 10*3/uL (ref 0.0–0.5)
Eosinophils Relative: 0 %
HCT: 39.7 % (ref 36.0–46.0)
Hemoglobin: 12.8 g/dL (ref 12.0–15.0)
Immature Granulocytes: 0 %
Lymphocytes Relative: 6 %
Lymphs Abs: 0.3 10*3/uL — ABNORMAL LOW (ref 0.7–4.0)
MCH: 30.6 pg (ref 26.0–34.0)
MCHC: 32.2 g/dL (ref 30.0–36.0)
MCV: 95 fL (ref 80.0–100.0)
Monocytes Absolute: 0.1 10*3/uL (ref 0.1–1.0)
Monocytes Relative: 3 %
Neutro Abs: 4 10*3/uL (ref 1.7–7.7)
Neutrophils Relative %: 91 %
Platelets: 55 10*3/uL — ABNORMAL LOW (ref 150–400)
RBC: 4.18 MIL/uL (ref 3.87–5.11)
RDW: 13.6 % (ref 11.5–15.5)
WBC: 4.5 10*3/uL (ref 4.0–10.5)
nRBC: 0 % (ref 0.0–0.2)

## 2019-10-19 LAB — BASIC METABOLIC PANEL
Anion gap: 10 (ref 5–15)
BUN: 22 mg/dL (ref 8–23)
CO2: 27 mmol/L (ref 22–32)
Calcium: 9.4 mg/dL (ref 8.9–10.3)
Chloride: 98 mmol/L (ref 98–111)
Creatinine, Ser: 0.93 mg/dL (ref 0.44–1.00)
GFR calc Af Amer: >60 mL/min (ref >60)
GFR calc non Af Amer: 57 mL/min — ABNORMAL LOW (ref >60)
Glucose, Bld: 255 mg/dL — ABNORMAL HIGH (ref 70–99)
Potassium: 5.4 mmol/L — ABNORMAL HIGH (ref 3.5–5.1)
Sodium: 135 mmol/L (ref 135–145)

## 2019-10-19 MED ORDER — DILTIAZEM HCL 30 MG PO TABS
30.0000 mg | ORAL_TABLET | Freq: Four times a day (QID) | ORAL | Status: DC
  Administered 2019-10-19 – 2019-10-20 (×4): 30 mg via ORAL
  Filled 2019-10-19 (×4): qty 1

## 2019-10-19 MED ORDER — SODIUM ZIRCONIUM CYCLOSILICATE 10 G PO PACK
10.0000 g | PACK | Freq: Two times a day (BID) | ORAL | Status: AC
  Administered 2019-10-19 (×2): 10 g via ORAL
  Filled 2019-10-19 (×2): qty 1

## 2019-10-19 MED ORDER — PREDNISONE 20 MG PO TABS
20.0000 mg | ORAL_TABLET | Freq: Every day | ORAL | Status: DC
  Administered 2019-10-19 – 2019-10-22 (×4): 20 mg via ORAL
  Filled 2019-10-19 (×3): qty 1

## 2019-10-19 NOTE — Progress Notes (Addendum)
PROGRESS NOTE    [name]  WLN:[id number] DOB: 1934/04/22 DOA: 10/14/2019 PCP: Mellody Dance, DO    Brief Narrative:  Patient admitted to the hospital with the working diagnosis of acute hypoxic respiratory failure due to cardiogenic pulmonary edema, diastolic heart failure exacerbation complicatedatrial fibrillationwith rapid ventricular responseandworsening ascites/ Acute copd exacerbation.   86 year old female who presented with dyspnea, she does have significant past medical history for COPD, chronic hypoxic respiratory failure, type 2 diabetes mellitus, [place], diastolic heart failure and obstructive sleep apnea. Patient reported the last few days had noticed increased abdominal distention, edema and weight gain. Positive dyspnea and wheezing. 1 week prior to hospitalization she was seen in the emergency department, noted a20 pound weight gain,butno significant ascites. On her initial physical examination blood pressure 100/61, pulse rate 86, respiratory rate14, temperature 98.3, oxygen saturation 99%with supplemental oxygen.Heart S1-S2 present, no gallops,rubs ormurmurs. Lungs with wheezing and ralesbilaterally, abdomen distended, positive lower extremity edema. Sodium 136, potassium 3.7, chloride 101, bicarb 26, glucose 285,BUN 8, creatinine 0.92,white count 3.9, hemoglobin 13.2, hematocrit 40.9, platelets 75.SARS COVID-19 negative. Chest radiograph without increased hilar vascular congestion, right base atelectasis. CT chest negative for pulmonary embolism. Positive cirrhosis with moderate to large volume abdominal ascites.EKG 123 bpm, normal axis, normal QRS, atrial fibrillation rhythm, no ST segment or T wave changes.  Patient has been placed on diltiazem drip for rate control, IV diuresis with furosemide and requested paracentesis.  Not enough fluid for paracentesis, diuresis on hold due to worsening renal function.  Improved rate control with  metoprolol, now off diltiazem drip. Renal function has been improving and her blood pressure has remained stable.    Assessment & Plan:   Principal Problem:   Acute on chronic diastolic CHF (congestive heart failure) (HCC) Active Problems:   Coronary artery disease   Thrombocytopenia, unspecified (HCC)   Hypothyroidism   Gastroesophageal reflux disease   COPD exacerbation (HCC)   Acute on chronic respiratory failure with hypoxia (HCC)   Liver cirrhosis secondary to NASH (HCC)   Paroxysmal atrial fibrillation (HCC)   Ascites of liver   Portal hypertension (HCC)   COPD with acute exacerbation (HCC)   Atrial fibrillation with RVR (HCC)   Class 1 obesity   1. Acute hypoxic respiratory failure due to acute decompensation of chronic diastolic heart failure, in the setting ofparoxysmalatrial fibrillation with RVR.Echocardiogram with LV systolic function 55 to 74%. Improved rate control, continue clinically euvolemic.   This am continue to have elevated HR 120, added oral diltiazem and will continue with 50 mg bid metoprolol. Continue close telemetry monitoring. Patient not candidate for anticoagulation due to thrombocytopenia.  2. COPD exacerbation. Wheezing has improved this am, will reduce dose of prednisone to 20 mg daily, will continue with combivent, inhaled corticosteroids and oxymetry monitoring. Patient has supplemental 02.   3. AKI with hyperkalemiaStable renal function with serum cr down to 0,93 with K at 5,4 and serum, bicarbonate at 27. Will add 2 doses of sodium zirconium for hyperkalemia and will follow renal panel in am, hold on further diuresis.   4. Cirrhosis due to NASH/ thrombocytopenia.Noencephalopathy or coagulopathy.Platelet today at 50,000. No signs of bleeding.   5, CAD.Stable.  6. Hypothyroid. Continue with Levothyroxine.  7. Obesity class 1. Calculated BMI more than 35,continue out of bed to chair as  tolerated.  8.UncontrolledT2DM()Hgb A1c 7,9) with steroid induced hyperglycemia.Fasting glucose up to 255, on basal insulin 30 units bid plus insulin sliding scale. Will reduce dose of prednisone and continue close glucose  monitoring.   DVT prophylaxis:enoxaparin Code Status:full Family Communication:no family at the bedside Disposition Plan/ discharge barriers:patient continue acutely ill  Consultants:   Cardiology    Subjective: Patient is feeling better, wheezing has improved, no nausea or vomiting. Her heart rate this am is again more than 120 bpm, no chest pain, no nausea or vomiting.   Objective: Vitals:   10/18/19 2250 10/19/19 0300 10/19/19 0803 10/19/19 0835  BP: 105/67  119/74   Pulse: (!) 125  (!) 128   Resp: 20     Temp: 98.1 F (36.7 C)  98.5 F (36.9 C)   TempSrc: Oral     SpO2: 97%  98% 98%  Weight:  83.3 kg    Height:        Intake/Output Summary (Last 24 hours) at 10/19/2019 0932 Last data filed at 10/18/2019 2131 Gross per 24 hour  Intake 590 ml  Output --  Net 590 ml   Filed Weights   10/14/19 1922 10/17/19 0357 10/19/19 0300  Weight: (!) 182 kg 86.7 kg 83.3 kg    Examination:   General: Not in pain or dyspnea, deconditioned  Neurology: Awake and alert, non focal  E ENT: no pallor, no icterus, oral mucosa moist Cardiovascular: No JVD. S1-S2 present, irregularly irregular with no gallops, rubs, or murmurs. No lower extremity edema. Pulmonary: positive breath sounds bilaterally, adequate air movement, no wheezing, rhonchi or rales. Gastrointestinal. Abdomen distended with no organomegaly, non tender, no rebound or guarding Skin. No rashes Musculoskeletal: no joint deformities     Data Reviewed: I have personally reviewed following labs and imaging studies  CBC: Recent Labs  Lab 10/14/19 1940 10/15/19 0520 10/16/19 0553 10/19/19 0338  WBC 3.9* 2.8* 8.2 4.5  NEUTROABS 2.3  --  6.7 4.0  HGB 13.2 13.4 12.5 12.8  HCT 40.9  42.0 38.6 39.7  MCV 95.3 95.0 95.1 95.0  PLT 75* 59* 90* 55*   Basic Metabolic Panel: Recent Labs  Lab 10/15/19 0520 10/16/19 0252 10/17/19 0238 10/18/19 1101 10/19/19 0338  NA 137 132* 133* 136 135  K 4.0 4.7 4.8 5.1 5.4*  CL 101 98 98 99 98  CO2 25 22 28 28 27   GLUCOSE 174* 333* 230* 198* 255*  BUN 8 23 27* 20 22  CREATININE 0.90 1.34* 1.14* 0.95 0.93  CALCIUM 8.7* 8.5* 9.0 9.3 9.4  MG 1.7  --   --   --   --   PHOS 3.1  --   --   --   --    GFR: Estimated Creatinine Clearance: 49.9 mL/min (by C-G formula based on SCr of 0.93 mg/dL). Liver Function Tests: Recent Labs  Lab 10/14/19 1940 10/15/19 0520  AST 33 29  ALT 19 20  ALKPHOS 159* 154*  BILITOT 1.1 1.4*  PROT 6.2* 6.6  ALBUMIN 2.7* 3.2*   No results for input(s): LIPASE, AMYLASE in the last 168 hours. Recent Labs  Lab 10/15/19 0154  AMMONIA 44*   Coagulation Profile: Recent Labs  Lab 10/15/19 0154  INR 1.2   Cardiac Enzymes: No results for input(s): CKTOTAL, CKMB, CKMBINDEX, TROPONINI in the last 168 hours. BNP (last 3 results) No results for input(s): PROBNP in the last 8760 hours. HbA1C: No results for input(s): HGBA1C in the last 72 hours. CBG: Recent Labs  Lab 10/18/19 1703 10/18/19 2006 10/18/19 2340 10/19/19 0418 10/19/19 0815  GLUCAP 265* 301* 265* 238* 230*   Lipid Profile: No results for input(s): CHOL, HDL, LDLCALC, TRIG, CHOLHDL, LDLDIRECT in  the last 72 hours. Thyroid Function Tests: No results for input(s): TSH, T4TOTAL, FREET4, T3FREE, THYROIDAB in the last 72 hours. Anemia Panel: No results for input(s): VITAMINB12, FOLATE, FERRITIN, TIBC, IRON, RETICCTPCT in the last 72 hours.    Radiology Studies: I have reviewed all of the imaging during this hospital visit personally     Scheduled Meds: . brimonidine  1 drop Both Eyes Daily  . cycloSPORINE  1 drop Both Eyes BID  . escitalopram  20 mg Oral Daily  . hydroxypropyl methylcellulose / hypromellose  1 drop Both Eyes  BID  . insulin aspart  0-9 Units Subcutaneous Q4H  . insulin glargine  30 Units Subcutaneous BID  . ipratropium-albuterol  3 mL Nebulization TID  . levothyroxine  100 mcg Oral Daily  . metoprolol tartrate  50 mg Oral BID  . mometasone-formoterol  1 puff Inhalation BID  . montelukast  10 mg Oral QPM  . pantoprazole  20 mg Oral BID  . predniSONE  40 mg Oral BID WC  . rifaximin  200 mg Oral BID  . sodium chloride flush  3 mL Intravenous Q12H  . sodium zirconium cyclosilicate  10 g Oral BID   Continuous Infusions: . sodium chloride       LOS: 5 days        Keaundre Thelin Gerome Apley, MD

## 2019-10-19 NOTE — Progress Notes (Signed)
Occupational Therapy Treatment Patient Details Name: [name] MRN: [id number] DOB: [birth year] Today's Date: 10/19/2019    History of present illness 86 y.o. female admitted on 10/14/19 for SOBDx with hypoxic respiratory failure due to cardiogenic pulmonary edema, diastolic heart failure exacerbation complicated by A-fib with RVR.  COVID 19 (-).  Pt with significant PMH of MI, LE edema, DM, CAD, non-alcoholic cirrhosis.     OT comments  Pt progressing towards OT Goals, presents sitting up in recliner pleasant and willing to participate in therapy session. Pt completing bathing and grooming ADL during this session, requiring minA for LB bathing and completing standing grooming ADL with minguard assist. Pt reports no DOE during this session and tolerating activity well, HR 110s-130s with seated/standing activity. Will continue per POC at this time.   Follow Up Recommendations  Home health OT;Supervision/Assistance - 24 hour(24hr initially)    Equipment Recommendations  None recommended by OT          Precautions / Restrictions Precautions Precautions: Fall;Other (comment) Precaution Comments: monitor HR (A-fib with RVR) Restrictions Weight Bearing Restrictions: No       Mobility Bed Mobility Overal bed mobility: Needs Assistance Bed Mobility: Supine to Sit;Sit to Supine     Supine to sit: Min guard Sit to supine: Min guard   General bed mobility comments: received sitting in recliner  Transfers Overall transfer level: Needs assistance Equipment used: None Transfers: Sit to/from Stand Sit to Stand: Min guard         General transfer comment: sit<>stand from chair at sink with minguard for safety and balance    Balance Overall balance assessment: Needs assistance Sitting-balance support: Feet supported;No upper extremity supported Sitting balance-Leahy Scale: Good     Standing balance support: Bilateral upper extremity supported;Single extremity  supported;During functional activity Standing balance-Leahy Scale: Fair Standing balance comment: can stand statically without RW and without UE support if not challenged but needs RW with challenges.                             ADL either performed or assessed with clinical judgement   ADL Overall ADL's : Needs assistance/impaired     Grooming: Oral care;Min guard;Standing   Upper Body Bathing: Set up;Supervision/ safety;Sitting;Minimal assistance Upper Body Bathing Details (indicate cue type and reason): assist only to wash back Lower Body Bathing: Minimal assistance;Sit to/from stand Lower Body Bathing Details (indicate cue type and reason): requires assist to wash buttocks  Upper Body Dressing : Set up;Sitting Upper Body Dressing Details (indicate cue type and reason): assist to don new gown                 Functional mobility during ADLs: Min guard General ADL Comments: pt completing bathing and grooming ADL during session, positioned recliner at sink (vs mobilizing to sink) for completion of ADL tasks as pt with elevated HR (resting HR 120s and occasionally into 130s). HR maintaining in the 110s-130s with seated and standing activity      Vision       Perception     Praxis      Cognition Arousal/Alertness: Awake/alert Behavior During Therapy: WFL for tasks assessed/performed Overall Cognitive Status: Within Functional Limits for tasks assessed                                 General Comments: [facility]  Exercises     Shoulder Instructions       General Comments      Pertinent Vitals/ Pain       Pain Assessment: Faces Faces Pain Scale: Hurts a little bit Pain Location: RUE Pain Descriptors / Indicators: Sore Pain Intervention(s): Limited activity within patient's tolerance;Monitored during session;Repositioned  Home Living                                          Prior Functioning/Environment               Frequency  Min 2X/week        Progress Toward Goals  OT Goals(current goals can now be found in the care plan section)  Progress towards OT goals: Progressing toward goals  Acute Rehab OT Goals Patient Stated Goal: to get stronger and go home.  OT Goal Formulation: With patient Time For Goal Achievement: 10/31/19 Potential to Achieve Goals: Good ADL Goals Pt Will Perform Grooming: with modified independence;standing Pt Will Perform Lower Body Bathing: with modified independence;sit to/from stand Pt Will Perform Upper Body Dressing: with modified independence;sitting Pt Will Perform Lower Body Dressing: with modified independence;sit to/from stand Pt Will Transfer to Toilet: with modified independence;ambulating Pt Will Perform Toileting - Clothing Manipulation and hygiene: with modified independence;sit to/from stand  Plan Discharge plan remains appropriate    Co-evaluation                 AM-PAC OT "6 Clicks" Daily Activity     Outcome Measure   Help from another person eating meals?: None Help from another person taking care of personal grooming?: A Little Help from another person toileting, which includes using toliet, bedpan, or urinal?: A Little Help from another person bathing (including washing, rinsing, drying)?: A Little Help from another person to put on and taking off regular upper body clothing?: None Help from another person to put on and taking off regular lower body clothing?: A Little 6 Click Score: 20    End of Session Equipment Utilized During Treatment: Oxygen  OT Visit Diagnosis: Muscle weakness (generalized) (M62.81);Unsteadiness on feet (R26.81)   Activity Tolerance Patient tolerated treatment well   Patient Left in chair;with call bell/phone within reach;with chair alarm set   Nurse Communication Mobility status        Time: 1040-1103 OT Time Calculation (min): 23 min  Charges: OT General Charges $OT Visit: 1 Visit OT  Treatments $Self Care/Home Management : 23-37 mins  Lou Cal, [place] Pager [phone] Office [place] 10/19/2019, 1:05 PM

## 2019-10-19 NOTE — Progress Notes (Signed)
Progress Note  Patient Name: [name] Date of Encounter: 10/19/2019  Primary Cardiologist: Mertie Moores, MD   Subjective   Dyspnea improved; no CP  Inpatient Medications    Scheduled Meds: . brimonidine  1 drop Both Eyes Daily  . cycloSPORINE  1 drop Both Eyes BID  . escitalopram  20 mg Oral Daily  . hydroxypropyl methylcellulose / hypromellose  1 drop Both Eyes BID  . insulin aspart  0-9 Units Subcutaneous Q4H  . insulin glargine  30 Units Subcutaneous BID  . ipratropium-albuterol  3 mL Nebulization TID  . levothyroxine  100 mcg Oral Daily  . metoprolol tartrate  50 mg Oral BID  . mometasone-formoterol  1 puff Inhalation BID  . montelukast  10 mg Oral QPM  . pantoprazole  20 mg Oral BID  . predniSONE  20 mg Oral Q breakfast  . rifaximin  200 mg Oral BID  . sodium chloride flush  3 mL Intravenous Q12H  . sodium zirconium cyclosilicate  10 g Oral BID   Continuous Infusions: . sodium chloride     PRN Meds: sodium chloride, acetaminophen **OR** acetaminophen, guaiFENesin-dextromethorphan, HYDROcodone-acetaminophen, levalbuterol, ondansetron **OR** ondansetron (ZOFRAN) IV, sodium chloride flush   Vital Signs    Vitals:   10/18/19 2250 10/19/19 0300 10/19/19 0803 10/19/19 0835  BP: 105/67  119/74   Pulse: (!) 125  (!) 128   Resp: 20     Temp: 98.1 F (36.7 C)  98.5 F (36.9 C)   TempSrc: Oral     SpO2: 97%  98% 98%  Weight:  83.3 kg    Height:        Intake/Output Summary (Last 24 hours) at 10/19/2019 1019 Last data filed at 10/18/2019 2131 Gross per 24 hour  Intake 590 ml  Output --  Net 590 ml   Last 3 Weights 10/19/2019 10/17/2019 10/14/2019  Weight (lbs) 183 lb 10.3 oz 191 lb 2.2 oz 401 lb 3.8 oz  Weight (kg) 83.3 kg 86.7 kg 182 kg      Telemetry    Atrial fibrillation with mildly elevated rate- Personally Reviewed  Physical Exam   GEN: No acute distress.   Neck: No JVD Cardiac: irregular Respiratory: Clear to auscultation bilaterally. GI:  Soft, positive fluid wave MS: No edema Neuro:  Nonfocal  Psych: Normal affect   Labs    High Sensitivity Troponin:   Recent Labs  Lab 10/14/19 1940 10/14/19 2128  TROPONINIHS 8 8      Chemistry Recent Labs  Lab 10/14/19 1940 10/14/19 1940 10/15/19 0520 10/16/19 0252 10/17/19 0238 10/18/19 1101 10/19/19 0338  NA 136   < > 137   < > 133* 136 135  K 3.7   < > 4.0   < > 4.8 5.1 5.4*  CL 101   < > 101   < > 98 99 98  CO2 26   < > 25   < > 28 28 27   GLUCOSE 285*   < > 174*   < > 230* 198* 255*  BUN 8   < > 8   < > 27* 20 22  CREATININE 0.92   < > 0.90   < > 1.14* 0.95 0.93  CALCIUM 8.3*   < > 8.7*   < > 9.0 9.3 9.4  PROT 6.2*  --  6.6  --   --   --   --   ALBUMIN 2.7*  --  3.2*  --   --   --   --  AST 33  --  29  --   --   --   --   ALT 19  --  20  --   --   --   --   ALKPHOS 159*  --  154*  --   --   --   --   BILITOT 1.1  --  1.4*  --   --   --   --   GFRNONAA 58*   < > 59*   < > 44* 55* 57*  GFRAA >60   < > >60   < > 51* >60 >60  ANIONGAP 9   < > 11   < > 7 9 10    < > = values in this interval not displayed.     Hematology Recent Labs  Lab 10/15/19 0520 10/16/19 0553 10/19/19 0338  WBC 2.8* 8.2 4.5  RBC 4.42 4.06 4.18  HGB 13.4 12.5 12.8  HCT 42.0 38.6 39.7  MCV 95.0 95.1 95.0  MCH 30.3 30.8 30.6  MCHC 31.9 32.4 32.2  RDW 14.1 14.1 13.6  PLT 59* 90* 55*    BNP Recent Labs  Lab 10/14/19 1944  BNP 243.2*     Radiology    DG Chest 1 View  Result Date: 10/18/2019 CLINICAL DATA:  Wheezing EXAM: CHEST  1 VIEW COMPARISON:  10/14/2019 FINDINGS: Cardiac enlargement without heart failure. Elevated right hemidiaphragm with right lower lobe atelectasis unchanged from the prior study. Negative for pneumonia or effusion. IMPRESSION: Right lower lobe atelectasis.  No change from the prior study Electronically Signed   By: Franchot Gallo M.D.   On: 10/18/2019 13:36    Patient Profile     86 y.o. female with a hx of CAD s/p DES RCA and latertheof LAD,  COPD/ILD, chronic combined CHF, morbid obesity, DM2, SVT, nonalcoholic cirrhosis, OSA not on CPAP, hypothyroidism, Paroxysmal afib not on anticoagulationwho is being seen for the evaluation of Afib RVR.  Echocardiogram February 2021 showed normal LV function, mild right ventricular enlargement, mild mitral regurgitation.  Assessment & Plan    1 persistent atrial fibrillation-heart rate remains elevated.  Continue metoprolol at present dose.  Options for additional rate control limited.  Would avoid amiodarone given history of interstitial lung disease and cirrhosis.  We will try low-dose Cardizem.  Begin 30 mg every 6 and if patient tolerates will transition to 120 mg daily.  Follow blood pressure closely.  Patient is not a candidate for anticoagulation given history of cirrhosis, esophageal varices and thrombocytopenia.  2 history of chronic diastolic congestive heart failure/ascites-patient does not appear to be volume overloaded on examination but will need resumption of diuretics at some point due to history of cirrhosis/ascites.  We will likely try to resume tomorrow.  3 coronary artery disease-patient is not on aspirin; this will need to be reviewed by Dr. Acie Fredrickson following discharge.  Patient is not on a statin niacin due to history of cirrhosis.  For questions or updates, please contact [place] Please consult [email or web address] for contact info under        Signed, Kirk Ruths, MD  10/19/2019, 10:19 AM

## 2019-10-19 NOTE — Progress Notes (Signed)
Physical Therapy Treatment Patient Details Name: [name] MRN: [id number] DOB: 26-Aug-1933 Today's Date: 10/19/2019    History of Present Illness 86 y.o. female admitted on 10/14/19 for SOBDx with hypoxic respiratory failure due to cardiogenic pulmonary edema, diastolic heart failure exacerbation complicated by A-fib with RVR.  COVID 19 (-).  Pt with significant PMH of MI, LE edema, DM, CAD, non-alcoholic cirrhosis.      PT Comments    Pt admitted with above diagnosis.  Pt was able to ambulate in hallway with 3LO2 to maintain sats >90%.  Pt with good stability with RW.  Pt progressing.  Pt currently with functional limitations due to the deficits listed below (see PT Problem List). Pt will benefit from skilled PT to increase their independence and safety with mobility to allow discharge to the venue listed below.     Follow Up Recommendations  Home health PT;Supervision - Intermittent     Equipment Recommendations  None recommended by PT    Recommendations for Other Services       Precautions / Restrictions Precautions Precautions: Fall;Other (comment) Precaution Comments: monitor HR (A-fib with RVR) Restrictions Weight Bearing Restrictions: No    Mobility  Bed Mobility Overal bed mobility: Needs Assistance Bed Mobility: Supine to Sit;Sit to Supine     Supine to sit: Min guard Sit to supine: Min guard   General bed mobility comments: close guarding for safety but did not need assist to come to EOB.     Transfers Overall transfer level: Needs assistance Equipment used: Rolling walker (2 wheeled) Transfers: Sit to/from Stand Sit to Stand: Min guard         General transfer comment: Min guard assist for safety, VCs for safe hand placement  Ambulation/Gait Ambulation/Gait assistance: Min guard Gait Distance (Feet): 300 Feet Assistive device: Rolling walker (2 wheeled) Gait Pattern/deviations: Step-through pattern;Decreased stride length   Gait velocity  interpretation: 1.31 - 2.62 ft/sec, indicative of limited community ambulator General Gait Details: Pt with steady gait today with RW use.  HR stable during gait, Desat on RA to 86% therefore replaced O2 and needed 3L O2 [place] to keep sats >90%.     Stairs             Wheelchair Mobility    Modified Rankin (Stroke Patients Only)       Balance Overall balance assessment: Needs assistance Sitting-balance support: Feet supported;No upper extremity supported Sitting balance-Leahy Scale: Good     Standing balance support: Bilateral upper extremity supported;Single extremity supported;During functional activity Standing balance-Leahy Scale: Fair Standing balance comment: can stand statically without RW and without UE support if not challenged but needs RW with challenges.                              Cognition Arousal/Alertness: Awake/alert Behavior During Therapy: WFL for tasks assessed/performed Overall Cognitive Status: Within Functional Limits for tasks assessed                                 General Comments: HOH      Exercises      General Comments        Pertinent Vitals/Pain Pain Assessment: No/denies pain    Home Living                      Prior Function  PT Goals (current goals can now be found in the care plan section) Acute Rehab PT Goals Patient Stated Goal: to get stronger and go home.  Progress towards PT goals: Progressing toward goals    Frequency    Min 3X/week      PT Plan Current plan remains appropriate    Co-evaluation              AM-PAC PT "6 Clicks" Mobility   Outcome Measure  Help needed turning from your back to your side while in a flat bed without using bedrails?: None Help needed moving from lying on your back to sitting on the side of a flat bed without using bedrails?: None Help needed moving to and from a bed to a chair (including a wheelchair)?: A Little Help  needed standing up from a chair using your arms (e.g., wheelchair or bedside chair)?: A Little Help needed to walk in hospital room?: A Little Help needed climbing 3-5 steps with a railing? : A Little 6 Click Score: 20    End of Session Equipment Utilized During Treatment: Oxygen;Gait belt Activity Tolerance: Patient tolerated treatment well Patient left: in chair;with call bell/phone within reach;with chair alarm set Nurse Communication: Mobility status PT Visit Diagnosis: Muscle weakness (generalized) (M62.81);Difficulty in walking, not elsewhere classified (R26.2)     Time: 7622-6333 PT Time Calculation (min) (ACUTE ONLY): 14 min  Charges:  $Gait Training: 8-22 mins                     [name],PT [place] Pager:  [phone]  Office:  [place] 10/19/2019, 10:18 AM

## 2019-10-19 NOTE — Progress Notes (Signed)
Inpatient Diabetes Program Recommendations  AACE/ADA: New Consensus Statement on Inpatient Glycemic Control (2015)  Target Ranges:  Prepandial:   less than 140 mg/dL      Peak postprandial:   less than 180 mg/dL (1-2 hours)      Critically ill patients:  140 - 180 mg/dL   Lab Results  Component Value Date   GLUCAP 290 (H) 10/19/2019   HGBA1C 7.9 (H) 10/15/2019    Review of Glycemic Control Results for [name], [name] (MRN [id number]) as of 10/19/2019 13:12  Ref. Range 10/18/2019 20:06 10/18/2019 23:40 10/19/2019 04:18 10/19/2019 08:15 10/19/2019 12:22  Glucose-Capillary Latest Ref Range: 70 - 99 mg/dL 301 (H) 265 (H) 238 (H) 230 (H) 290 (H)   Diabetes history: DM 2 Outpatient Diabetes medications: Aspart Flexpen- 15 units bid with meals, Lantus 40 units bid Current orders for Inpatient glycemic control:  Lantus 30 units bid, Novolog sensitive q 4 hours Inpatient Diabetes Program Recommendations:    If appropriate, consider adding Novolog 4 units tid with meals (hold if patient eats less than 50%).   Thanks  Adah Perl, RN, BC-ADM Inpatient Diabetes Coordinator Pager [phone] (8a-5p)

## 2019-10-20 DIAGNOSIS — E669 Obesity, unspecified: Secondary | ICD-10-CM

## 2019-10-20 LAB — BASIC METABOLIC PANEL
Anion gap: 9 (ref 5–15)
BUN: 22 mg/dL (ref 8–23)
CO2: 28 mmol/L (ref 22–32)
Calcium: 9 mg/dL (ref 8.9–10.3)
Chloride: 97 mmol/L — ABNORMAL LOW (ref 98–111)
Creatinine, Ser: 1 mg/dL (ref 0.44–1.00)
GFR calc Af Amer: >60 mL/min (ref >60)
GFR calc non Af Amer: 52 mL/min — ABNORMAL LOW (ref >60)
Glucose, Bld: 251 mg/dL — ABNORMAL HIGH (ref 70–99)
Potassium: 4.7 mmol/L (ref 3.5–5.1)
Sodium: 134 mmol/L — ABNORMAL LOW (ref 135–145)

## 2019-10-20 LAB — GLUCOSE, CAPILLARY
Glucose-Capillary: 145 mg/dL — ABNORMAL HIGH (ref 70–99)
Glucose-Capillary: 149 mg/dL — ABNORMAL HIGH (ref 70–99)
Glucose-Capillary: 252 mg/dL — ABNORMAL HIGH (ref 70–99)
Glucose-Capillary: 352 mg/dL — ABNORMAL HIGH (ref 70–99)
Glucose-Capillary: 365 mg/dL — ABNORMAL HIGH (ref 70–99)
Glucose-Capillary: 392 mg/dL — ABNORMAL HIGH (ref 70–99)

## 2019-10-20 MED ORDER — INSULIN GLARGINE 100 UNIT/ML ~~LOC~~ SOLN
40.0000 [IU] | Freq: Two times a day (BID) | SUBCUTANEOUS | Status: DC
  Administered 2019-10-20 – 2019-10-21 (×2): 40 [IU] via SUBCUTANEOUS
  Filled 2019-10-20 (×4): qty 0.4

## 2019-10-20 MED ORDER — FUROSEMIDE 40 MG PO TABS: 40.0000 mg | ORAL_TABLET | Freq: Two times a day (BID) | ORAL | Status: DC

## 2019-10-20 MED ORDER — DILTIAZEM HCL ER COATED BEADS 120 MG PO CP24
120.0000 mg | ORAL_CAPSULE | Freq: Every day | ORAL | Status: DC
  Administered 2019-10-20 – 2019-10-23 (×4): 120 mg via ORAL
  Filled 2019-10-20 (×4): qty 1

## 2019-10-20 MED ORDER — INSULIN GLARGINE 100 UNIT/ML ~~LOC~~ SOLN
10.0000 [IU] | Freq: Once | SUBCUTANEOUS | Status: AC
  Administered 2019-10-20: 10 [IU] via SUBCUTANEOUS
  Filled 2019-10-20: qty 0.1

## 2019-10-20 MED ORDER — FUROSEMIDE 40 MG PO TABS
40.0000 mg | ORAL_TABLET | Freq: Two times a day (BID) | ORAL | Status: DC
  Administered 2019-10-20 – 2019-10-22 (×5): 40 mg via ORAL
  Filled 2019-10-20 (×5): qty 1

## 2019-10-20 MED ORDER — SPIRONOLACTONE 25 MG PO TABS
50.0000 mg | ORAL_TABLET | Freq: Every day | ORAL | Status: DC
  Administered 2019-10-20 – 2019-10-23 (×4): 50 mg via ORAL
  Filled 2019-10-20 (×4): qty 2

## 2019-10-20 NOTE — Plan of Care (Signed)
Plan of care reviewed with pt at bedside. Pt cooperative and pleasant. VSS, On tele in afib. Medications given per orders. Pt up with standby assist. On 2L [place]. Call bell in reach. Will continue to monitor. Fall bundle in place.  Problem: Education: Goal: Knowledge of disease or condition will improve Outcome: Progressing Goal: Understanding of medication regimen will improve Outcome: Progressing Goal: Individualized Educational Video(s) Outcome: Progressing   Problem: Activity: Goal: Ability to tolerate increased activity will improve Outcome: Progressing   Problem: Cardiac: Goal: Ability to achieve and maintain adequate cardiopulmonary perfusion will improve Outcome: Progressing   Problem: Health Behavior/Discharge Planning: Goal: Ability to safely manage health-related needs after discharge will improve Outcome: Progressing

## 2019-10-20 NOTE — Plan of Care (Signed)

## 2019-10-20 NOTE — Progress Notes (Signed)
Progress Note  Patient Name: [name] Date of Encounter: 10/20/2019  Primary Cardiologist: Mertie Moores, MD   Subjective   Pt denies CP or dyspnea  Inpatient Medications    Scheduled Meds: . brimonidine  1 drop Both Eyes Daily  . cycloSPORINE  1 drop Both Eyes BID  . diltiazem  30 mg Oral Q6H  . escitalopram  20 mg Oral Daily  . hydroxypropyl methylcellulose / hypromellose  1 drop Both Eyes BID  . insulin aspart  0-9 Units Subcutaneous Q4H  . insulin glargine  30 Units Subcutaneous BID  . ipratropium-albuterol  3 mL Nebulization TID  . levothyroxine  100 mcg Oral Daily  . metoprolol tartrate  50 mg Oral BID  . mometasone-formoterol  1 puff Inhalation BID  . montelukast  10 mg Oral QPM  . pantoprazole  20 mg Oral BID  . predniSONE  20 mg Oral Q breakfast  . rifaximin  200 mg Oral BID  . sodium chloride flush  3 mL Intravenous Q12H   Continuous Infusions: . sodium chloride     PRN Meds: sodium chloride, acetaminophen **OR** acetaminophen, guaiFENesin-dextromethorphan, HYDROcodone-acetaminophen, levalbuterol, ondansetron **OR** ondansetron (ZOFRAN) IV, sodium chloride flush   Vital Signs    Vitals:   10/19/19 2033 10/20/19 0500 10/20/19 0811 10/20/19 0816  BP:   (!) 95/55   Pulse:   74   Resp:   18   Temp:   98.1 F (36.7 C)   TempSrc:      SpO2: 97%  98% 98%  Weight:  82.1 kg    Height:        Intake/Output Summary (Last 24 hours) at 10/20/2019 1038 Last data filed at 10/20/2019 0909 Gross per 24 hour  Intake 440 ml  Output 1000 ml  Net -560 ml   Last 3 Weights 10/20/2019 10/19/2019 10/17/2019  Weight (lbs) 181 lb 183 lb 10.3 oz 191 lb 2.2 oz  Weight (kg) 82.1 kg 83.3 kg 86.7 kg      Telemetry    Atrial fibrillation rate controlled- Personally Reviewed  Physical Exam   GEN: No acute distress.  WD WN Neck: No JVD, supple Cardiac: irregular, no gallop Respiratory: CTA GI: Soft, positive fluid wave MS: No edema Neuro:  Grossly intact Psych:  Normal affect   Labs    High Sensitivity Troponin:   Recent Labs  Lab 10/14/19 1940 10/14/19 2128  TROPONINIHS 8 8      Chemistry Recent Labs  Lab 10/14/19 1940 10/14/19 1940 10/15/19 0520 10/16/19 0252 10/17/19 0238 10/18/19 1101 10/19/19 0338  NA 136   < > 137   < > 133* 136 135  K 3.7   < > 4.0   < > 4.8 5.1 5.4*  CL 101   < > 101   < > 98 99 98  CO2 26   < > 25   < > 28 28 27   GLUCOSE 285*   < > 174*   < > 230* 198* 255*  BUN 8   < > 8   < > 27* 20 22  CREATININE 0.92   < > 0.90   < > 1.14* 0.95 0.93  CALCIUM 8.3*   < > 8.7*   < > 9.0 9.3 9.4  PROT 6.2*  --  6.6  --   --   --   --   ALBUMIN 2.7*  --  3.2*  --   --   --   --   AST 33  --  29  --   --   --   --   ALT 19  --  20  --   --   --   --   ALKPHOS 159*  --  154*  --   --   --   --   BILITOT 1.1  --  1.4*  --   --   --   --   GFRNONAA 58*   < > 59*   < > 44* 55* 57*  GFRAA >60   < > >60   < > 51* >60 >60  ANIONGAP 9   < > 11   < > 7 9 10    < > = values in this interval not displayed.     Hematology Recent Labs  Lab 10/15/19 0520 10/16/19 0553 10/19/19 0338  WBC 2.8* 8.2 4.5  RBC 4.42 4.06 4.18  HGB 13.4 12.5 12.8  HCT 42.0 38.6 39.7  MCV 95.0 95.1 95.0  MCH 30.3 30.8 30.6  MCHC 31.9 32.4 32.2  RDW 14.1 14.1 13.6  PLT 59* 90* 55*    BNP Recent Labs  Lab 10/14/19 1944  BNP 243.2*     Radiology    DG Chest 1 View  Result Date: 10/18/2019 CLINICAL DATA:  Wheezing EXAM: CHEST  1 VIEW COMPARISON:  10/14/2019 FINDINGS: Cardiac enlargement without heart failure. Elevated right hemidiaphragm with right lower lobe atelectasis unchanged from the prior study. Negative for pneumonia or effusion. IMPRESSION: Right lower lobe atelectasis.  No change from the prior study Electronically Signed   By: Franchot Gallo M.D.   On: 10/18/2019 13:36    Patient Profile     86 y.o. female with a hx of CAD s/p DES RCA and latertheof LAD, COPD/ILD, chronic combined CHF, morbid obesity, DM2, SVT, nonalcoholic  cirrhosis, OSA not on CPAP, hypothyroidism, Paroxysmal afib not on anticoagulationwho is being seen for the evaluation of Afib RVR.  Echocardiogram February 2021 showed normal LV function, mild right ventricular enlargement, mild mitral regurgitation.  Assessment & Plan    1 persistent atrial fibrillation-heart rate improved this morning.  We will continue metoprolol.  Change Cardizem to CD.  Continue to follow blood pressure closely. Patient is not a candidate for anticoagulation given history of cirrhosis, esophageal varices and thrombocytopenia.  2 history of chronic diastolic congestive heart failure/ascites-patient with ascites on examination.  Will resume Lasix 40 mg twice daily and spironolactone 50 mg daily.  Follow renal function.  3 coronary artery disease-patient is not on aspirin; this will need to be reviewed by Dr. Acie Fredrickson following discharge.  Patient is not on a statin due to history of cirrhosis.  For questions or updates, please contact [place] Please consult [email or web address] for contact info under        Signed, Kirk Ruths, MD  10/20/2019, 10:38 AM

## 2019-10-20 NOTE — Progress Notes (Signed)
PROGRESS NOTE    [name]  WJX:[id number] DOB: 01/10/1934 DOA: 10/14/2019 PCP: Mellody Dance, DO    Brief Narrative:  Patient admitted to the hospital with the working diagnosis of acute hypoxic respiratory failure due to cardiogenic pulmonary edema, diastolic heart failure exacerbation complicatedatrial fibrillationwith rapid ventricular responseandworsening ascites/ Acute copd exacerbation.  86 year old female who presented with dyspnea, she does have significant past medical history for COPD, chronic hypoxic respiratory failure, type 2 diabetes mellitus, Nash, diastolic heart failure and obstructive sleep apnea. Patient reported the last few days had noticed increased abdominal distention, edema and weight gain. Positive dyspnea and wheezing. 1 week prior to hospitalization she was seen in the emergency department, noted a20 pound weight gain,butno significant ascites. On her initial physical examination blood pressure 100/61, pulse rate 86, respiratory rate14, temperature 98.3, oxygen saturation 99%with supplemental oxygen.Heart S1-S2 present, no gallops,rubs ormurmurs. Lungs with wheezing and ralesbilaterally, abdomen distended, positive lower extremity edema. Sodium 136, potassium 3.7, chloride 101, bicarb 26, glucose 285,BUN 8, creatinine 0.92,white count 3.9, hemoglobin 13.2, hematocrit 40.9, platelets 75.SARS COVID-19 negative. Chest radiograph without increased hilar vascular congestion, right base atelectasis. CT chest negative for pulmonary embolism. Positive cirrhosis with moderate to large volume abdominal ascites.EKG 123 bpm, normal axis, normal QRS, atrial fibrillation rhythm, no ST segment or T wave changes.  Patient has been placed on diltiazem drip for rate control, IV diuresis with furosemide and requested paracentesis.  Not enough fluid for paracentesis, diuresis on hold due to worsening renal function.  Improved rate controlwith  metoprolol, now off diltiazem drip.Renal function has been improving and her blood pressure has remained stable.Developed wheezing likely due to COPD exacerbation, started on systemic steroids and bronchodilators.   Add diltiazem po for better rate control.    Assessment & Plan:   Principal Problem:   Acute on chronic diastolic CHF (congestive heart failure) (HCC) Active Problems:   Coronary artery disease   Thrombocytopenia, unspecified (HCC)   Hypothyroidism   Gastroesophageal reflux disease   COPD exacerbation (HCC)   Acute on chronic respiratory failure with hypoxia (HCC)   Liver cirrhosis secondary to NASH (HCC)   Paroxysmal atrial fibrillation (HCC)   Ascites of liver   Portal hypertension (HCC)   COPD with acute exacerbation (HCC)   Atrial fibrillation with RVR (HCC)   Class 1 obesity    1. Acute hypoxic respiratory failure due to acute decompensation of chronic diastolic heart failure, in the setting ofparoxysmalatrial fibrillation with RVR.Echocardiogram with LV systolic function 55 to 21%. Patient clinically euvolemic, improved rate with oral diltiazem, persistent atrial fibrillation, personally reviewed telemetry monitoring. Blood pressure 308 systolic.   Continue with oral diltiazem, to consider consolidate with long acting formulation, continue with 50 mg bid metoprolol. Not candidate for anticoagulation due to thrombocytopenia. Continue to hold on furosemide for now.   2. COPD exacerbation. Will continue systemic steroids with prednisone 20 mg daily, bronchodilator therapy with combivent, inhaled corticosteroids and oxymetry monitoring.Patient has supplemental home 02, 2 L/ min.   3. AKI with hyperkalemiaStable renal function with serum cr at 1,0 with improved K at 4,7 and serum bicarbonate at 28. Will hold sodium zirconium and will follow up renal panel in am.  4. Cirrhosis due to NASH/ thrombocytopenia.Noencephalopathy or coagulopathy.Platelet  50,000. With no signs of bleeding. Continue with rifaximin.   5, CAD.Stable. No chest pain.   6. Hypothyroid.On Levothyroxine.  7. Obesity class 1. Calculated BMI more than 35,patient will need outpatient follow up.  8.UncontrolledT2DM()Hgb A1c 7,9) with steroid induced  hyperglycemia.Am glucose upto 251,capillary 352. Will resume continue to titrate basal insulin to 40  unitsbid and will continue with insulin sliding scale.    DVT prophylaxis:enoxaparin Code Status:full Family Communication:no family at the bedside Disposition Plan/ discharge barriers:patient from home, barrier to discharge uncontrolled atrial fibrillation, possible dc in am with home health services.   Consultants:   Cardiology    Subjective: Patient is feeling better, dyspnea has improved but continue to have dry cough, no further wheezing, no nausea or vomiting, no abdominal pain.   Objective: Vitals:   10/19/19 2033 10/20/19 0500 10/20/19 0811 10/20/19 0816  BP:   (!) 95/55   Pulse:   74   Resp:   18   Temp:   98.1 F (36.7 C)   TempSrc:      SpO2: 97%  98% 98%  Weight:  82.1 kg    Height:        Intake/Output Summary (Last 24 hours) at 10/20/2019 0944 Last data filed at 10/20/2019 4008 Gross per 24 hour  Intake 440 ml  Output 1000 ml  Net -560 ml   Filed Weights   10/17/19 0357 10/19/19 0300 10/20/19 0500  Weight: 86.7 kg 83.3 kg 82.1 kg    Examination:   General: Not in pain or dyspnea, deconditioned  Neurology: Awake and alert, non focal  E ENT: mild pallor, no icterus, oral mucosa moist Cardiovascular: No JVD. S1-S2 present, irregularly irregular, no gallops, rubs, or murmurs. No lower extremity edema. Pulmonary: positive breath sounds bilaterally, decreased bilateral air movement, no wheezing, scattered rhonchi and rales. Gastrointestinal. Abdomen protuberant with no organomegaly, non tender, no rebound or guarding Skin. No rashes Musculoskeletal: no joint  deformities     Data Reviewed: I have personally reviewed following labs and imaging studies  CBC: Recent Labs  Lab 10/14/19 1940 10/15/19 0520 10/16/19 0553 10/19/19 0338  WBC 3.9* 2.8* 8.2 4.5  NEUTROABS 2.3  --  6.7 4.0  HGB 13.2 13.4 12.5 12.8  HCT 40.9 42.0 38.6 39.7  MCV 95.3 95.0 95.1 95.0  PLT 75* 59* 90* 55*   Basic Metabolic Panel: Recent Labs  Lab 10/15/19 0520 10/16/19 0252 10/17/19 0238 10/18/19 1101 10/19/19 0338  NA 137 132* 133* 136 135  K 4.0 4.7 4.8 5.1 5.4*  CL 101 98 98 99 98  CO2 25 22 28 28 27   GLUCOSE 174* 333* 230* 198* 255*  BUN 8 23 27* 20 22  CREATININE 0.90 1.34* 1.14* 0.95 0.93  CALCIUM 8.7* 8.5* 9.0 9.3 9.4  MG 1.7  --   --   --   --   PHOS 3.1  --   --   --   --    GFR: Estimated Creatinine Clearance: 49.5 mL/min (by C-G formula based on SCr of 0.93 mg/dL). Liver Function Tests: Recent Labs  Lab 10/14/19 1940 10/15/19 0520  AST 33 29  ALT 19 20  ALKPHOS 159* 154*  BILITOT 1.1 1.4*  PROT 6.2* 6.6  ALBUMIN 2.7* 3.2*   No results for input(s): LIPASE, AMYLASE in the last 168 hours. Recent Labs  Lab 10/15/19 0154  AMMONIA 44*   Coagulation Profile: Recent Labs  Lab 10/15/19 0154  INR 1.2   Cardiac Enzymes: No results for input(s): CKTOTAL, CKMB, CKMBINDEX, TROPONINI in the last 168 hours. BNP (last 3 results) No results for input(s): PROBNP in the last 8760 hours. HbA1C: No results for input(s): HGBA1C in the last 72 hours. CBG: Recent Labs  Lab 10/19/19 1541 10/19/19 1941  10/19/19 2336 10/20/19 0313 10/20/19 0804  GLUCAP 357* 227* 192* 145* 149*   Lipid Profile: No results for input(s): CHOL, HDL, LDLCALC, TRIG, CHOLHDL, LDLDIRECT in the last 72 hours. Thyroid Function Tests: No results for input(s): TSH, T4TOTAL, FREET4, T3FREE, THYROIDAB in the last 72 hours. Anemia Panel: No results for input(s): VITAMINB12, FOLATE, FERRITIN, TIBC, IRON, RETICCTPCT in the last 72 hours.    Radiology Studies: I  have reviewed all of the imaging during this hospital visit personally     Scheduled Meds: . brimonidine  1 drop Both Eyes Daily  . cycloSPORINE  1 drop Both Eyes BID  . diltiazem  30 mg Oral Q6H  . escitalopram  20 mg Oral Daily  . hydroxypropyl methylcellulose / hypromellose  1 drop Both Eyes BID  . insulin aspart  0-9 Units Subcutaneous Q4H  . insulin glargine  30 Units Subcutaneous BID  . ipratropium-albuterol  3 mL Nebulization TID  . levothyroxine  100 mcg Oral Daily  . metoprolol tartrate  50 mg Oral BID  . mometasone-formoterol  1 puff Inhalation BID  . montelukast  10 mg Oral QPM  . pantoprazole  20 mg Oral BID  . predniSONE  20 mg Oral Q breakfast  . rifaximin  200 mg Oral BID  . sodium chloride flush  3 mL Intravenous Q12H   Continuous Infusions: . sodium chloride       LOS: 6 days        Latanza Pfefferkorn Gerome Apley, MD

## 2019-10-21 DIAGNOSIS — E11649 Type 2 diabetes mellitus with hypoglycemia without coma: Secondary | ICD-10-CM

## 2019-10-21 DIAGNOSIS — E1165 Type 2 diabetes mellitus with hyperglycemia: Secondary | ICD-10-CM

## 2019-10-21 LAB — BASIC METABOLIC PANEL
Anion gap: 12 (ref 5–15)
BUN: 24 mg/dL — ABNORMAL HIGH (ref 8–23)
CO2: 26 mmol/L (ref 22–32)
Calcium: 9.2 mg/dL (ref 8.9–10.3)
Chloride: 95 mmol/L — ABNORMAL LOW (ref 98–111)
Creatinine, Ser: 1.01 mg/dL — ABNORMAL HIGH (ref 0.44–1.00)
GFR calc Af Amer: 60 mL/min — ABNORMAL LOW (ref >60)
GFR calc non Af Amer: 51 mL/min — ABNORMAL LOW (ref >60)
Glucose, Bld: 109 mg/dL — ABNORMAL HIGH (ref 70–99)
Potassium: 4.2 mmol/L (ref 3.5–5.1)
Sodium: 133 mmol/L — ABNORMAL LOW (ref 135–145)

## 2019-10-21 LAB — GLUCOSE, CAPILLARY
Glucose-Capillary: 82 mg/dL (ref 70–99)
Glucose-Capillary: 47 mg/dL — ABNORMAL LOW (ref 70–99)
Glucose-Capillary: 227 mg/dL — ABNORMAL HIGH (ref 70–99)
Glucose-Capillary: 60 mg/dL — ABNORMAL LOW (ref 70–99)
Glucose-Capillary: 130 mg/dL — ABNORMAL HIGH (ref 70–99)
Glucose-Capillary: 273 mg/dL — ABNORMAL HIGH (ref 70–99)
Glucose-Capillary: 296 mg/dL — ABNORMAL HIGH (ref 70–99)
Glucose-Capillary: 321 mg/dL — ABNORMAL HIGH (ref 70–99)
Glucose-Capillary: 366 mg/dL — ABNORMAL HIGH (ref 70–99)

## 2019-10-21 LAB — MAGNESIUM: Magnesium: 2 mg/dL (ref 1.7–2.4)

## 2019-10-21 MED ORDER — INSULIN GLARGINE 100 UNIT/ML ~~LOC~~ SOLN
20.0000 [IU] | Freq: Every day | SUBCUTANEOUS | Status: DC
  Administered 2019-10-21 – 2019-10-22 (×2): 20 [IU] via SUBCUTANEOUS
  Filled 2019-10-21 (×3): qty 0.2

## 2019-10-21 MED ORDER — INSULIN GLARGINE 100 UNIT/ML ~~LOC~~ SOLN
40.0000 [IU] | Freq: Every day | SUBCUTANEOUS | Status: DC
  Administered 2019-10-22 – 2019-10-23 (×2): 40 [IU] via SUBCUTANEOUS
  Filled 2019-10-21 (×2): qty 0.4

## 2019-10-21 MED ORDER — DEXTROSE 50 % IV SOLN
1.0000 | Freq: Once | INTRAVENOUS | Status: AC
  Administered 2019-10-21: 50 mL via INTRAVENOUS
  Filled 2019-10-21: qty 50

## 2019-10-21 NOTE — Plan of Care (Signed)

## 2019-10-21 NOTE — Progress Notes (Signed)
Occupational Therapy Treatment Patient Details Name: [name] MRN: [id number] DOB: October 15, 1933 Today's Date: 10/21/2019    History of present illness 86 y.o. female admitted on 10/14/19 for SOBDx with hypoxic respiratory failure due to cardiogenic pulmonary edema, diastolic heart failure exacerbation complicated by A-fib with RVR.  COVID 19 (-).  Pt with significant PMH of MI, LE edema, DM, CAD, non-alcoholic cirrhosis.     OT comments  Patient supine in bed upon entry and agreeable to OT session.  Patient completing transfers with supervision, in room mobility using RW with min guard for safety, and grooming standing at sink with supervision.  Min cueing for attention to task and safety awareness.  HR maintained 90-100 throughout session, once noted up to 122 during grooming but recovered quickly.  On [place] upon entry, but O2 turned off and SpO2 maintained 93-34%.  Will follow acutely, dc plan remains appropriate.    Follow Up Recommendations  Home health OT;Supervision/Assistance - 24 hour(24/7 initally)    Equipment Recommendations  None recommended by OT    Recommendations for Other Services      Precautions / Restrictions Precautions Precautions: Fall;Other (comment) Precaution Comments: monitor HR (A-fib with RVR) Restrictions Weight Bearing Restrictions: No       Mobility Bed Mobility Overal bed mobility: Needs Assistance Bed Mobility: Supine to Sit     Supine to sit: Min assist     General bed mobility comments: pt reaching for UE support for trunk elevation  Transfers Overall transfer level: Needs assistance Equipment used: None Transfers: Sit to/from Stand Sit to Stand: Supervision         General transfer comment: basic sit to stand with supervision for safety     Balance Overall balance assessment: Needs assistance Sitting-balance support: Feet supported;No upper extremity supported Sitting balance-Leahy Scale: Good     Standing balance support:  Bilateral upper extremity supported;During functional activity;No upper extremity supported Standing balance-Leahy Scale: Fair Standing balance comment: no UE support during grooming with supervision, preference for 1-2 UE support during ambulation                           ADL either performed or assessed with clinical judgement   ADL Overall ADL's : Needs assistance/impaired     Grooming: Wash/dry hands;Brushing hair;Standing;Supervision/safety Grooming Details (indicate cue type and reason): min cueing to attend to tasks, turn off water after completion                 Toilet Transfer: Min guard;Ambulation;RW Toilet Transfer Details (indicate cue type and reason): simulated to recliner, cueing for RW safety          Functional mobility during ADLs: Min guard;Rolling walker General ADL Comments: pt completed grooming at sink, HR monitored with 1 increase to 122 but recovered quickly; overall ranging from 90-100.  Patient reports completing mobility without RW to restroom independently prior to session.      Vision       Perception     Praxis      Cognition Arousal/Alertness: Awake/alert Behavior During Therapy: WFL for tasks assessed/performed Overall Cognitive Status: Within Functional Limits for tasks assessed                                 General Comments: [facility]        Exercises     Shoulder Instructions  General Comments on with [place] on upon entry but noted turned off, SpO2 montiored during session with SpO2 93-94%    Pertinent Vitals/ Pain       Pain Assessment: No/denies pain  Home Living                                          Prior Functioning/Environment              Frequency  Min 2X/week        Progress Toward Goals  OT Goals(current goals can now be found in the care plan section)  Progress towards OT goals: Progressing toward goals  Acute Rehab OT Goals Patient Stated Goal:  to get stronger and go home.  OT Goal Formulation: With patient  Plan Discharge plan remains appropriate;Frequency remains appropriate    Co-evaluation                 AM-PAC OT "6 Clicks" Daily Activity     Outcome Measure   Help from another person eating meals?: None Help from another person taking care of personal grooming?: A Little Help from another person toileting, which includes using toliet, bedpan, or urinal?: A Little Help from another person bathing (including washing, rinsing, drying)?: A Little Help from another person to put on and taking off regular upper body clothing?: None Help from another person to put on and taking off regular lower body clothing?: A Little 6 Click Score: 20    End of Session Equipment Utilized During Treatment: Rolling walker  OT Visit Diagnosis: Muscle weakness (generalized) (M62.81);Unsteadiness on feet (R26.81)   Activity Tolerance Patient tolerated treatment well   Patient Left in chair;with call bell/phone within reach;with chair alarm set   Nurse Communication Mobility status        Time: 1027-2536 OT Time Calculation (min): 15 min  Charges: OT General Charges $OT Visit: 1 Visit OT Treatments $Self Care/Home Management : 8-22 mins  Jolaine Artist, OT [place] Pager [phone] Office [place] 10/21/2019, 9:02 AM

## 2019-10-21 NOTE — Consult Note (Signed)
   [facility] CM Inpatient Consult   10/21/2019  Caytlin Better [facility] 03-02-1934 [id number]   Patient is currently active with [facility] for chronic disease management services in the [place] with a [facility] RN Chronic Care Management Coordinator.  [facility] RN Care Coordinator made this writer aware of hospitalization.   Our community based plan of care has focused on disease management for diabetes and community resource support.    Patient will receive a post hospital call and will be evaluated for assessments and disease process education, if appropriate with disposition and transition of care.    Chart review reveals that patient was admitted 10/15/2019.  MD progress notes reviewed and reveals from narrative notes as follows which includes but not limited to:  86 year old female who presented with dyspnea, she does have significant past medical history for COPD, chronic hypoxic respiratory failure, type 2 diabetes mellitus, Nash, diastolic heart failure and obstructive sleep apnea. Patient reported the last few days had noticed increased abdominal distention, edema and weight gain. Positive dyspnea and wheezing. 1 week prior to hospitalization she was seen in the emergency department, noted a20 pound weight gain,butno significant ascites.  Plan: Follow up notes  Inpatient Transition Of Care [TOC] team member to make aware that [facility] following. Patient currently for home with home health {[facility]].  PT/OT recommending HH with 24 hour supervision noted.  Continue to follow for needs and progress and update [facility] RN Chronic Care Management Coordinator of any post hospital needs.  Of note, [facility] Care Management services does not replace or interfere with any services that are needed or arranged by inpatient [facility] care management team.  For additional questions or referrals please contact:  Natividad Brood,  RN BSN [facility] Liaison  [phone] business mobile phone Toll free office [phone]  Fax number: [phone] [email or web address]

## 2019-10-21 NOTE — Progress Notes (Signed)
PROGRESS NOTE  [name] ALP:[id number] DOB: [birth year]   PCP: Mellody Dance, DO  Patient is from: Home  DOA: 10/14/2019 LOS: 7  Brief Narrative / Interim history: 86 year old female with history of COPD, chronic respiratory failure, DM-2, 9 cirrhosis, diastolic CHF and OSA presenting with abdominal distention, edema, weight gain, dyspnea and wheezing and admitted with acute respiratory failure with hypoxia due to cardiogenic pulmonary edema, diastolic CHF complicated by A. fib with RVR and worsening ascites.   Patient was started on Cardizem drip for rate control and IV Lasix for fluid overload.  There was not enough fluid for paracentesis.  She developed wheezing and was started on systemic steroid and bronchodilator for COPD exacerbation.  Cardiology managing A. Fib.   Subjective: Patient has significant hypoglycemic event to 47 early this morning that has improved to 227 later.  No other issues overnight.  Remains in RVR with HR ranging from 130s to 150s.  No complaints.  She denies chest pain, dyspnea, palpitation, GI or UTI symptoms.  Objective: Vitals:   10/20/19 1612 10/20/19 2013 10/20/19 2055 10/21/19 0824  BP: 113/79  104/68 95/74  Pulse: (!) 146  (!) 151 (!) 154  Resp: 18  18   Temp:   97.7 F (36.5 C) 97.7 F (36.5 C)  TempSrc:   Oral   SpO2: 100% 98% 95% 96%  Weight:      Height:        Intake/Output Summary (Last 24 hours) at 10/21/2019 1315 Last data filed at 10/20/2019 2057 Gross per 24 hour  Intake 203 ml  Output 200 ml  Net 3 ml   Filed Weights   10/17/19 0357 10/19/19 0300 10/20/19 0500  Weight: 86.7 kg 83.3 kg 82.1 kg    Examination:  GENERAL: No acute distress.  Appears well.  HEENT: MMM.  Vision and hearing grossly intact.  NECK: Supple.  No apparent JVD.  RESP:  No IWOB. Good air movement bilaterally. CVS: IR IR. Heart sounds normal.  ABD/GI/GU: Bowel sounds present. Soft. Non tender.  MSK/EXT:  Moves extremities. No apparent  deformity.  Trace edema bilaterally SKIN: no apparent skin lesion or wound NEURO: Awake, alert and oriented appropriately.  No apparent focal neuro deficit. PSYCH: Calm. Normal affect.  Procedures:  None  Assessment & Plan: Acute respiratory failure with hypoxia-multifactorial including CHF, A. fib with RVR and possibly COPD -Treat treatable causes -Wean oxygen as able -Encourage incentive spirometry, OOB/PT/OT  A. fib with RVR: HR in the range of 110s to 120s. -Continue p.o. Cardizem CD and metoprolol -Not a candidate for anticoagulation given history of esophageal varices and thrombocytopenia  Acute on chronic diastolic CHF: Echo with EF of 55 to 60%.  Appears euvolemic.  I&O incomplete. -Continue Lasix and Aldactone -Monitor fluid status, renal function and electrolytes  COPD exacerbation -Continue low-dose prednisone 20 mg daily, bronchodilators and inhalers. -Encourage incentive spirometry  AKI: Resolved -Continue monitoring  Hyperkalemia: Resolved  NASH Cirrhosis: No significant ascites for paracentesis. -Diuretics as above -Continue rifaximin  Chronic CAD: Stable.  No anginal symptoms. -Beta-blocker as above  Uncontrolled DM-2 with hypoglycemia and hyperglycemia: A1c 7.9%. Recent Labs    10/21/19 0612 10/21/19 0826 10/21/19 1121  GLUCAP 227* 130* 273*  -Reduce nightly Lantus from 40 to 20 units -Continue morning Lantus at 40 units daily -Continue SSI-thin  Hypothyroidism -Continue home Synthroid               DVT prophylaxis: Subcu Lovenox Code Status: Full code Family Communication: Patient and/or  RN. Available if any question.  Discharge barrier: A. fib with RVR and labile blood glucose Patient is from: home Final disposition: Likely home in the next 24 hours once HR and CBG stable  Consultants: Cardiology   Microbiology summarized:  Sch Meds:  Scheduled Meds: . brimonidine  1 drop Both Eyes Daily  . cycloSPORINE  1 drop Both Eyes  BID  . diltiazem  120 mg Oral Daily  . escitalopram  20 mg Oral Daily  . furosemide  40 mg Oral BID  . hydroxypropyl methylcellulose / hypromellose  1 drop Both Eyes BID  . insulin aspart  0-9 Units Subcutaneous Q4H  . insulin glargine  40 Units Subcutaneous BID  . ipratropium-albuterol  3 mL Nebulization TID  . levothyroxine  100 mcg Oral Daily  . metoprolol tartrate  50 mg Oral BID  . mometasone-formoterol  1 puff Inhalation BID  . montelukast  10 mg Oral QPM  . pantoprazole  20 mg Oral BID  . predniSONE  20 mg Oral Q breakfast  . rifaximin  200 mg Oral BID  . sodium chloride flush  3 mL Intravenous Q12H  . spironolactone  50 mg Oral Daily   Continuous Infusions: . sodium chloride     PRN Meds:.sodium chloride, acetaminophen **OR** acetaminophen, guaiFENesin-dextromethorphan, HYDROcodone-acetaminophen, levalbuterol, ondansetron **OR** ondansetron (ZOFRAN) IV, sodium chloride flush  Antimicrobials: Anti-infectives (From admission, onward)   Start     Dose/Rate Route Frequency Ordered Stop   10/18/19 1515  rifaximin (XIFAXAN) tablet 200 mg     200 mg Oral 2 times daily 10/18/19 1411     10/16/19 1000  azithromycin (ZITHROMAX) tablet 500 mg  Status:  Discontinued     500 mg Oral Daily 10/15/19 0339 10/15/19 0938   10/15/19 0500  azithromycin (ZITHROMAX) 500 mg in sodium chloride 0.9 % 250 mL IVPB     500 mg 250 mL/hr over 60 Minutes Intravenous Every 24 hours 10/15/19 0339 10/15/19 1428       I have personally reviewed the following labs and images: CBC: Recent Labs  Lab 10/14/19 1940 10/15/19 0520 10/16/19 0553 10/19/19 0338  WBC 3.9* 2.8* 8.2 4.5  NEUTROABS 2.3  --  6.7 4.0  HGB 13.2 13.4 12.5 12.8  HCT 40.9 42.0 38.6 39.7  MCV 95.3 95.0 95.1 95.0  PLT 75* 59* 90* 55*   BMP &GFR Recent Labs  Lab 10/15/19 0520 10/16/19 0252 10/17/19 0238 10/18/19 1101 10/19/19 0338 10/20/19 0925 10/21/19 0910  NA 137   < > 133* 136 135 134* 133*  K 4.0   < > 4.8 5.1 5.4*  4.7 4.2  CL 101   < > 98 99 98 97* 95*  CO2 25   < > 28 28 27 28 26   GLUCOSE 174*   < > 230* 198* 255* 251* 109*  BUN 8   < > 27* 20 22 22  24*  CREATININE 0.90   < > 1.14* 0.95 0.93 1.00 1.01*  CALCIUM 8.7*   < > 9.0 9.3 9.4 9.0 9.2  MG 1.7  --   --   --   --   --  2.0  PHOS 3.1  --   --   --   --   --   --    < > = values in this interval not displayed.   Estimated Creatinine Clearance: 45.6 mL/min (A) (by C-G formula based on SCr of 1.01 mg/dL (H)). Liver & Pancreas: Recent Labs  Lab 10/14/19 1940 10/15/19 7903  AST 33 29  ALT 19 20  ALKPHOS 159* 154*  BILITOT 1.1 1.4*  PROT 6.2* 6.6  ALBUMIN 2.7* 3.2*   No results for input(s): LIPASE, AMYLASE in the last 168 hours. Recent Labs  Lab 10/15/19 0154  AMMONIA 44*   Diabetic: No results for input(s): HGBA1C in the last 72 hours. Recent Labs  Lab 10/21/19 0522 10/21/19 0539 10/21/19 0612 10/21/19 0826 10/21/19 1121  GLUCAP 47* 60* 227* 130* 273*   Cardiac Enzymes: No results for input(s): CKTOTAL, CKMB, CKMBINDEX, TROPONINI in the last 168 hours. No results for input(s): PROBNP in the last 8760 hours. Coagulation Profile: Recent Labs  Lab 10/15/19 0154  INR 1.2   Thyroid Function Tests: No results for input(s): TSH, T4TOTAL, FREET4, T3FREE, THYROIDAB in the last 72 hours. Lipid Profile: No results for input(s): CHOL, HDL, LDLCALC, TRIG, CHOLHDL, LDLDIRECT in the last 72 hours. Anemia Panel: No results for input(s): VITAMINB12, FOLATE, FERRITIN, TIBC, IRON, RETICCTPCT in the last 72 hours. Urine analysis:    Component Value Date/Time   COLORURINE YELLOW 04/17/2019 0051   APPEARANCEUR CLEAR 04/17/2019 0051   LABSPEC 1.005 04/17/2019 0051   PHURINE 7.0 04/17/2019 0051   GLUCOSEU NEGATIVE 04/17/2019 0051   HGBUR SMALL (A) 04/17/2019 0051   BILIRUBINUR neg 08/19/2019 [place] 04/17/2019 0051   PROTEINUR Negative 08/19/2019 1018   PROTEINUR NEGATIVE 04/17/2019 0051   UROBILINOGEN 1.0  08/19/2019 1018   UROBILINOGEN 0.2 12/12/2012 0920   NITRITE neg 08/19/2019 1018   NITRITE NEGATIVE 04/17/2019 0051   LEUKOCYTESUR Negative 08/19/2019 1018   LEUKOCYTESUR NEGATIVE 04/17/2019 0051   Sepsis Labs: Invalid input(s): PROCALCITONIN, [place]  Microbiology: No results found for this or any previous visit (from the past 240 hour(s)).  Radiology Studies: No results found.    Rishika Mccollom T. Higbee  If 7PM-7AM, please contact night-coverage [email or web address] Password [email or web address] 10/21/2019, 1:15 PM

## 2019-10-21 NOTE — Progress Notes (Signed)
Physical Therapy Treatment Patient Details Name: [name] MRN: [id number] DOB: Jan 13, 1934 Today's Date: 10/21/2019    History of Present Illness 86 y.o. female admitted on 10/14/19 for SOBDx with hypoxic respiratory failure due to cardiogenic pulmonary edema, diastolic heart failure exacerbation complicated by A-fib with RVR.  COVID 19 (-).  Pt with significant PMH of MI, LE edema, DM, CAD, non-alcoholic cirrhosis.      PT Comments    Pt demonstrating good progress.  She required min cues for transfers and RW use.  O2 sats were stable on 2 LPM O2 and HR was between 100-109 bpm with walking.    Follow Up Recommendations  Home health PT;Supervision - Intermittent     Equipment Recommendations  None recommended by PT    Recommendations for Other Services       Precautions / Restrictions Precautions Precautions: Fall Precaution Comments: monitor HR (A-fib with RVR)    Mobility  Bed Mobility Overal bed mobility: Needs Assistance Bed Mobility: Supine to Sit     Supine to sit: Min guard Sit to supine: Min guard   General bed mobility comments: increased time and effort from pt  Transfers Overall transfer level: Needs assistance Equipment used: None Transfers: Sit to/from Stand Sit to Stand: Supervision         General transfer comment: performed from bed and from toilet; performed ADLs with supervision for safety  Ambulation/Gait Ambulation/Gait assistance: Min guard Gait Distance (Feet): 300 Feet Assistive device: Rolling walker (2 wheeled) Gait Pattern/deviations: Step-through pattern;Decreased stride length     General Gait Details: Pt with steady gait today with RW use - cued for RW proximity.  HR stable during gait between 100-109 bpm,  On 2 LPM O2 with sats 94%   Stairs             Wheelchair Mobility    Modified Rankin (Stroke Patients Only)       Balance Overall balance assessment: Needs assistance Sitting-balance support: Feet  supported;No upper extremity supported Sitting balance-Leahy Scale: Good     Standing balance support: During functional activity;No upper extremity supported Standing balance-Leahy Scale: Good                              Cognition Arousal/Alertness: Awake/alert Behavior During Therapy: WFL for tasks assessed/performed Overall Cognitive Status: Within Functional Limits for tasks assessed                                        Exercises      General Comments        Pertinent Vitals/Pain Pain Assessment: No/denies pain    Home Living                      Prior Function            PT Goals (current goals can now be found in the care plan section) Progress towards PT goals: Progressing toward goals    Frequency    Min 3X/week      PT Plan Current plan remains appropriate    Co-evaluation              AM-PAC PT "6 Clicks" Mobility   Outcome Measure  Help needed turning from your back to your side while in a flat bed without using bedrails?: None Help needed moving  from lying on your back to sitting on the side of a flat bed without using bedrails?: None Help needed moving to and from a bed to a chair (including a wheelchair)?: None Help needed standing up from a chair using your arms (e.g., wheelchair or bedside chair)?: None Help needed to walk in hospital room?: None Help needed climbing 3-5 steps with a railing? : A Little 6 Click Score: 23    End of Session Equipment Utilized During Treatment: Oxygen;Gait belt Activity Tolerance: Patient tolerated treatment well Patient left: with call bell/phone within reach;in bed;with bed alarm set Nurse Communication: Mobility status PT Visit Diagnosis: Muscle weakness (generalized) (M62.81);Difficulty in walking, not elsewhere classified (R26.2)     Time: 0813-8871 PT Time Calculation (min) (ACUTE ONLY): 21 min  Charges:  $Gait Training: 8-22 mins                      Maggie Font, PT Acute Rehab Services Pager [phone] [facility] [phone] [facility] [place] 10/21/2019, 1:54 PM

## 2019-10-21 NOTE — Progress Notes (Signed)
Progress Note  Patient Name: [name] Date of Encounter: 10/21/2019  Primary Cardiologist: Mertie Moores, MD   Subjective   No CP or dyspnea; weak earlier from hypoglycemia  Inpatient Medications    Scheduled Meds: . brimonidine  1 drop Both Eyes Daily  . cycloSPORINE  1 drop Both Eyes BID  . diltiazem  120 mg Oral Daily  . escitalopram  20 mg Oral Daily  . furosemide  40 mg Oral BID  . hydroxypropyl methylcellulose / hypromellose  1 drop Both Eyes BID  . insulin aspart  0-9 Units Subcutaneous Q4H  . insulin glargine  40 Units Subcutaneous BID  . ipratropium-albuterol  3 mL Nebulization TID  . levothyroxine  100 mcg Oral Daily  . metoprolol tartrate  50 mg Oral BID  . mometasone-formoterol  1 puff Inhalation BID  . montelukast  10 mg Oral QPM  . pantoprazole  20 mg Oral BID  . predniSONE  20 mg Oral Q breakfast  . rifaximin  200 mg Oral BID  . sodium chloride flush  3 mL Intravenous Q12H  . spironolactone  50 mg Oral Daily   Continuous Infusions: . sodium chloride     PRN Meds: sodium chloride, acetaminophen **OR** acetaminophen, guaiFENesin-dextromethorphan, HYDROcodone-acetaminophen, levalbuterol, ondansetron **OR** ondansetron (ZOFRAN) IV, sodium chloride flush   Vital Signs    Vitals:   10/20/19 1612 10/20/19 2013 10/20/19 2055 10/21/19 0824  BP: 113/79  104/68 95/74  Pulse: (!) 146  (!) 151 (!) 154  Resp: 18  18   Temp:   97.7 F (36.5 C) 97.7 F (36.5 C)  TempSrc:   Oral   SpO2: 100% 98% 95% 96%  Weight:      Height:        Intake/Output Summary (Last 24 hours) at 10/21/2019 0908 Last data filed at 10/20/2019 2057 Gross per 24 hour  Intake 503 ml  Output 200 ml  Net 303 ml   Last 3 Weights 10/20/2019 10/19/2019 10/17/2019  Weight (lbs) 181 lb 183 lb 10.3 oz 191 lb 2.2 oz  Weight (kg) 82.1 kg 83.3 kg 86.7 kg      Telemetry    Atrial fibrillation with PVCs or aberrantly conducted beats; rate controlled- Personally Reviewed  Physical Exam    GEN: NAD Neck: supple Cardiac: irregular Respiratory: CTA; no wheeze GI: Soft, positive fluid wave, no masses MS: No edema Neuro:  No focal findings Psych: Normal affect   Labs    High Sensitivity Troponin:   Recent Labs  Lab 10/14/19 1940 10/14/19 2128  TROPONINIHS 8 8      Chemistry Recent Labs  Lab 10/14/19 1940 10/14/19 1940 10/15/19 0520 10/16/19 0252 10/18/19 1101 10/19/19 0338 10/20/19 0925  NA 136   < > 137   < > 136 135 134*  K 3.7   < > 4.0   < > 5.1 5.4* 4.7  CL 101   < > 101   < > 99 98 97*  CO2 26   < > 25   < > 28 27 28   GLUCOSE 285*   < > 174*   < > 198* 255* 251*  BUN 8   < > 8   < > 20 22 22   CREATININE 0.92   < > 0.90   < > 0.95 0.93 1.00  CALCIUM 8.3*   < > 8.7*   < > 9.3 9.4 9.0  PROT 6.2*  --  6.6  --   --   --   --  ALBUMIN 2.7*  --  3.2*  --   --   --   --   AST 33  --  29  --   --   --   --   ALT 19  --  20  --   --   --   --   ALKPHOS 159*  --  154*  --   --   --   --   BILITOT 1.1  --  1.4*  --   --   --   --   GFRNONAA 58*   < > 59*   < > 55* 57* 52*  GFRAA >60   < > >60   < > >60 >60 >60  ANIONGAP 9   < > 11   < > 9 10 9    < > = values in this interval not displayed.     Hematology Recent Labs  Lab 10/15/19 0520 10/16/19 0553 10/19/19 0338  WBC 2.8* 8.2 4.5  RBC 4.42 4.06 4.18  HGB 13.4 12.5 12.8  HCT 42.0 38.6 39.7  MCV 95.0 95.1 95.0  MCH 30.3 30.8 30.6  MCHC 31.9 32.4 32.2  RDW 14.1 14.1 13.6  PLT 59* 90* 55*    BNP Recent Labs  Lab 10/14/19 1944  BNP 243.2*     Patient Profile     86 y.o. female with a hx of CAD s/p DES RCA and latertheof LAD, COPD/ILD, chronic combined CHF, morbid obesity, DM2, SVT, nonalcoholic cirrhosis, OSA not on CPAP, hypothyroidism, Paroxysmal afib not on anticoagulationwho is being seen for the evaluation of Afib RVR.  Echocardiogram February 2021 showed normal LV function, mild right ventricular enlargement, mild mitral regurgitation.  Assessment & Plan    1 persistent  atrial fibrillation-I have reviewed the patient's telemetry.  Heart rate is controlled (upper normal).  Continue Cardizem and metoprolol at present dose.  Blood pressure appears to be tolerating.  Patient is not a candidate for anticoagulation given history of cirrhosis, esophageal varices and thrombocytopenia.  2 history of chronic diastolic congestive heart failure/ascites-Lasix and spironolactone resumed yesterday.  Spironolactone dose was reduced compared to admission.  We will continue to follow volume status and adjust regimen as needed.  Would check potassium and renal function 1 week following discharge.  3 coronary artery disease-patient is not on aspirin; this will need to be reviewed by Dr. Acie Fredrickson following discharge.  Patient is not on a statin due to history of cirrhosis.  [facility] will sign off.   Medication Recommendations: Meds as listed in [facility] Other recommendations (labs, testing, etc): No further cardiac testing Follow up as an outpatient: Follow-up with Dr. Acie Fredrickson 2 to 4 weeks after discharge.  For questions or updates, please contact [place] Please consult [email or web address] for contact info under        Signed, Kirk Ruths, MD  10/21/2019, 9:08 AM

## 2019-10-21 NOTE — Progress Notes (Signed)
Pt called RN into room stating "Feeling like my sugar is low" RN obtained CBG and resulted 47. 8oz. Of orange juice was given, hypoglycemic protocol was initiated. CBG re-check resulted 60. Patient is alert, but stating she feels weak. M. Katherina Right was notified and orders for given for 1 amp of D50%. Will recheck CBG.

## 2019-10-21 NOTE — Progress Notes (Signed)
Inpatient Diabetes Program Recommendations  AACE/ADA: New Consensus Statement on Inpatient Glycemic Control (2015)  Target Ranges:  Prepandial:   less than 140 mg/dL      Peak postprandial:   less than 180 mg/dL (1-2 hours)      Critically ill patients:  140 - 180 mg/dL   Lab Results  Component Value Date   GLUCAP 130 (H) 10/21/2019   HGBA1C 7.9 (H) 10/15/2019    Review of Glycemic Control  Results for DEBBI, STRANDBERG (MRN [id number]) as of 10/21/2019 09:52  Ref. Range 10/20/2019 19:46 10/20/2019 23:35 10/21/2019 03:16 10/21/2019 05:22 10/21/2019 05:39 10/21/2019 06:12 10/21/2019 08:26  Glucose-Capillary Latest Ref Range: 70 - 99 mg/dL 365 (H)  9 units Novolog @2100  + lantus 40 units 252 (H)  5 units Novolog @2344  82 47 (L) 60 (L) 227 (H) 130 (H)    Diabetes history: DM 2 Outpatient Diabetes medications: Aspart Flexpen- 15 units bid with meals, Lantus 40 units bid Current orders for Inpatient glycemic control:  Lantus 40 units bid, Novolog sensitive q 4 hours  Inpatient Diabetes Program Recommendations:     Noted that patient was hypoglycemic this morning 47/60 mg/dl likely due to receiving 9 units of novolog at 2100 and 5 units novolog at 2344 last night.  Patient is eating meals.  MD please consider  -Novolog 0-9 TID with meals instead of Q 4 hours  Thank you, Reche Dixon, RN, BSN Diabetes Coordinator Inpatient Diabetes Program [phone] (team pager from 8a-5p)

## 2019-10-22 LAB — GLUCOSE, CAPILLARY
Glucose-Capillary: 181 mg/dL — ABNORMAL HIGH (ref 70–99)
Glucose-Capillary: 135 mg/dL — ABNORMAL HIGH (ref 70–99)
Glucose-Capillary: 332 mg/dL — ABNORMAL HIGH (ref 70–99)
Glucose-Capillary: 315 mg/dL — ABNORMAL HIGH (ref 70–99)
Glucose-Capillary: 276 mg/dL — ABNORMAL HIGH (ref 70–99)
Glucose-Capillary: 192 mg/dL — ABNORMAL HIGH (ref 70–99)

## 2019-10-22 LAB — RENAL FUNCTION PANEL
Albumin: 3 g/dL — ABNORMAL LOW (ref 3.5–5.0)
Anion gap: 12 (ref 5–15)
BUN: 24 mg/dL — ABNORMAL HIGH (ref 8–23)
CO2: 27 mmol/L (ref 22–32)
Calcium: 9 mg/dL (ref 8.9–10.3)
Chloride: 95 mmol/L — ABNORMAL LOW (ref 98–111)
Creatinine, Ser: 1.15 mg/dL — ABNORMAL HIGH (ref 0.44–1.00)
GFR calc Af Amer: 51 mL/min — ABNORMAL LOW (ref >60)
GFR calc non Af Amer: 44 mL/min — ABNORMAL LOW (ref >60)
Glucose, Bld: 266 mg/dL — ABNORMAL HIGH (ref 70–99)
Phosphorus: 3.8 mg/dL (ref 2.5–4.6)
Potassium: 4.8 mmol/L (ref 3.5–5.1)
Sodium: 134 mmol/L — ABNORMAL LOW (ref 135–145)

## 2019-10-22 LAB — CBC
HCT: 38.6 % (ref 36.0–46.0)
Hemoglobin: 12.8 g/dL (ref 12.0–15.0)
MCH: 31 pg (ref 26.0–34.0)
MCHC: 33.2 g/dL (ref 30.0–36.0)
MCV: 93.5 fL (ref 80.0–100.0)
Platelets: 64 10*3/uL — ABNORMAL LOW (ref 150–400)
RBC: 4.13 MIL/uL (ref 3.87–5.11)
RDW: 13.8 % (ref 11.5–15.5)
WBC: 7.3 10*3/uL (ref 4.0–10.5)
nRBC: 0 % (ref 0.0–0.2)

## 2019-10-22 LAB — MAGNESIUM: Magnesium: 2 mg/dL (ref 1.7–2.4)

## 2019-10-22 MED ORDER — INSULIN ASPART 100 UNIT/ML ~~LOC~~ SOLN
0.0000 [IU] | Freq: Every day | SUBCUTANEOUS | Status: DC
  Administered 2019-10-22: 3 [IU] via SUBCUTANEOUS

## 2019-10-22 MED ORDER — FUROSEMIDE 40 MG PO TABS
40.0000 mg | ORAL_TABLET | Freq: Every day | ORAL | Status: DC
  Administered 2019-10-23: 40 mg via ORAL
  Filled 2019-10-22: qty 1

## 2019-10-22 MED ORDER — DIGOXIN 0.25 MG/ML IJ SOLN
0.5000 mg | Freq: Once | INTRAMUSCULAR | Status: AC
  Administered 2019-10-22: 0.5 mg via INTRAVENOUS
  Filled 2019-10-22: qty 2

## 2019-10-22 MED ORDER — INSULIN ASPART 100 UNIT/ML ~~LOC~~ SOLN
0.0000 [IU] | Freq: Three times a day (TID) | SUBCUTANEOUS | Status: DC
  Administered 2019-10-22: 11 [IU] via SUBCUTANEOUS

## 2019-10-22 MED ORDER — DIGOXIN 125 MCG PO TABS
0.1250 mg | ORAL_TABLET | Freq: Every day | ORAL | Status: DC
  Administered 2019-10-23: 0.125 mg via ORAL
  Filled 2019-10-22: qty 1

## 2019-10-22 NOTE — Progress Notes (Signed)
Telemetry called RN at this time to report patient in afib with rates between 130-160. Dr. Stanford Breed with cardiology at bedside. Plan to add another medication for better HR control. Pt had also just changed positions from lying to sitting on the edge of the bed. Pt appears to be in no distress and denies chest pain. Will further monitor.

## 2019-10-22 NOTE — Progress Notes (Signed)
PROGRESS NOTE  [name] DXA:[id number] DOB: 1934/05/17   PCP: Mellody Dance, DO  Patient is from: Home  DOA: 10/14/2019 LOS: 8  Brief Narrative / Interim history: 85 year old female with history of COPD, chronic respiratory failure, DM-2, 9 cirrhosis, diastolic CHF and OSA presenting with abdominal distention, edema, weight gain, dyspnea and wheezing and admitted with acute respiratory failure with hypoxia due to cardiogenic pulmonary edema, diastolic CHF complicated by A. fib with RVR and worsening ascites.   Patient was started on Cardizem drip for rate control and IV Lasix for fluid overload.  There was not enough fluid for paracentesis.  She developed wheezing and was started on systemic steroid and bronchodilator for COPD exacerbation.  Cardiology managing A. Fib.   Subjective: Patient went into RVR with heart rate in 130s.  Also with soft blood pressure.  She has no complaints.  Denies chest pain, dyspnea, dizziness, GI or UTI symptoms.   Objective: Vitals:   10/21/19 2202 10/22/19 0728 10/22/19 0736 10/22/19 1205  BP:  (!) 117/96  (!) 103/50  Pulse:  (!) 131  71  Resp:  18    Temp:  (!) 96.7 F (35.9 C)  97.8 F (36.6 C)  TempSrc:    Oral  SpO2: 98% 99% 99% 99%  Weight:      Height:        Intake/Output Summary (Last 24 hours) at 10/22/2019 1228 Last data filed at 10/22/2019 1025 Gross per 24 hour  Intake 480 ml  Output 300 ml  Net 180 ml   Filed Weights   10/17/19 0357 10/19/19 0300 10/20/19 0500  Weight: 86.7 kg 83.3 kg 82.1 kg    Examination:  GENERAL: No apparent distress.  Sitting on bedside chair. HEENT: MMM.  Vision and hearing grossly intact.  NECK: Supple.  No apparent JVD.  RESP:  No IWOB.  Aeration bilaterally. CVS: IR IR. Heart sounds normal.  ABD/GI/GU: Bowel sounds present. Soft. Non tender.  MSK/EXT:  Moves extremities. No apparent deformity. No edema.  SKIN: no apparent skin lesion or wound NEURO: Awake, alert and oriented  appropriately.  No apparent focal neuro deficit. PSYCH: Calm. Normal affect.  Procedures:  None  Assessment & Plan: A. fib with RVR: HR in 130s this morning.  Soft blood pressures. -Digoxin 0.25 mg once, then 0.125 mg daily per cardiology -Continue p.o. Cardizem CD and metoprolol -Not a candidate for anticoagulation given history of esophageal varices and thrombocytopenia  Acute on chronic respiratory failure with hypoxia-multifactorial including CHF, A. fib with RVR and possibly COPD.  Stable on home 2 L by [place]. -Treat treatable causes -Encourage incentive spirometry, OOB/PT/OT  Acute on chronic diastolic CHF: Echo with EF of 55 to 60%.  Appears euvolemic.  I&O incomplete.  Creatinine slightly up. -Decrease Lasix to 40 mg daily -Continue Aldactone 50 mg daily -Monitor fluid status, renal function and electrolytes  COPD exacerbation: Exacerbation resolved. -Discontinue prednisone -Continue breathing treatments -Encourage incentive spirometry  AKI-initially resolved. Cr slightly up this morning. -Adjust diuretics as above -Continue monitoring  Hyperkalemia: Resolved  NASH Cirrhosis: No significant ascites for paracentesis. -Diuretics as above -Continue rifaximin  Chronic CAD: Stable.  No anginal symptoms. -Beta-blocker as above  Uncontrolled DM-2 with hypoglycemia and hyperglycemia: A1c 7.9%.  Hyperglycemia likely due to steroids. Recent Labs    10/22/19 0347 10/22/19 0738 10/22/19 1204  GLUCAP 181* 135* 332*  -Stop steroid -Continue Lantus 40 units a.m. and 20 units HS -Increase SSI to moderate  Hypothyroidism -Continue home Synthroid  Debility/physical deconditioning -PT/OT-HH PT               DVT prophylaxis: Subcu Lovenox Code Status: Full code Family Communication: Updated patient's daughter, [name] over the phone.  Discharge barrier: A. fib with RVR, soft blood pressures and labile CBG.  Needs close monitoring on tele as we make adjustments to  her medications Patient is from: home Final disposition: Likely home in the next 24 hours once HR, BP and CBG stable, and cardiology signed off.  Consultants: Cardiology   Microbiology summarized:  Sch Meds:  Scheduled Meds: . brimonidine  1 drop Both Eyes Daily  . cycloSPORINE  1 drop Both Eyes BID  . [START ON 10/23/2019] digoxin  0.125 mg Oral Daily  . diltiazem  120 mg Oral Daily  . escitalopram  20 mg Oral Daily  . [START ON 10/23/2019] furosemide  40 mg Oral Daily  . hydroxypropyl methylcellulose / hypromellose  1 drop Both Eyes BID  . insulin aspart  0-15 Units Subcutaneous TID WC  . insulin aspart  0-5 Units Subcutaneous QHS  . insulin glargine  20 Units Subcutaneous QHS  . insulin glargine  40 Units Subcutaneous Daily  . ipratropium-albuterol  3 mL Nebulization TID  . levothyroxine  100 mcg Oral Daily  . metoprolol tartrate  50 mg Oral BID  . mometasone-formoterol  1 puff Inhalation BID  . montelukast  10 mg Oral QPM  . pantoprazole  20 mg Oral BID  . rifaximin  200 mg Oral BID  . sodium chloride flush  3 mL Intravenous Q12H  . spironolactone  50 mg Oral Daily   Continuous Infusions: . sodium chloride     PRN Meds:.sodium chloride, acetaminophen **OR** acetaminophen, guaiFENesin-dextromethorphan, HYDROcodone-acetaminophen, levalbuterol, ondansetron **OR** ondansetron (ZOFRAN) IV, sodium chloride flush  Antimicrobials: Anti-infectives (From admission, onward)   Start     Dose/Rate Route Frequency Ordered Stop   10/18/19 1515  rifaximin (XIFAXAN) tablet 200 mg     200 mg Oral 2 times daily 10/18/19 1411     10/16/19 1000  azithromycin (ZITHROMAX) tablet 500 mg  Status:  Discontinued     500 mg Oral Daily 10/15/19 0339 10/15/19 0938   10/15/19 0500  azithromycin (ZITHROMAX) 500 mg in sodium chloride 0.9 % 250 mL IVPB     500 mg 250 mL/hr over 60 Minutes Intravenous Every 24 hours 10/15/19 0339 10/15/19 1428       I have personally reviewed the following labs and  images: CBC: Recent Labs  Lab 10/16/19 0553 10/19/19 0338 10/22/19 0158  WBC 8.2 4.5 7.3  NEUTROABS 6.7 4.0  --   HGB 12.5 12.8 12.8  HCT 38.6 39.7 38.6  MCV 95.1 95.0 93.5  PLT 90* 55* 64*   BMP &GFR Recent Labs  Lab 10/18/19 1101 10/19/19 0338 10/20/19 0925 10/21/19 0910 10/22/19 0158  NA 136 135 134* 133* 134*  K 5.1 5.4* 4.7 4.2 4.8  CL 99 98 97* 95* 95*  CO2 28 27 28 26 27   GLUCOSE 198* 255* 251* 109* 266*  BUN 20 22 22  24* 24*  CREATININE 0.95 0.93 1.00 1.01* 1.15*  CALCIUM 9.3 9.4 9.0 9.2 9.0  MG  --   --   --  2.0 2.0  PHOS  --   --   --   --  3.8   Estimated Creatinine Clearance: 40 mL/min (A) (by C-G formula based on SCr of 1.15 mg/dL (H)). Liver & Pancreas: Recent Labs  Lab 10/22/19 0158  ALBUMIN 3.0*  No results for input(s): LIPASE, AMYLASE in the last 168 hours. No results for input(s): AMMONIA in the last 168 hours. Diabetic: No results for input(s): HGBA1C in the last 72 hours. Recent Labs  Lab 10/21/19 1924 10/21/19 2230 10/22/19 0347 10/22/19 0738 10/22/19 1204  GLUCAP 321* 366* 181* 135* 332*   Cardiac Enzymes: No results for input(s): CKTOTAL, CKMB, CKMBINDEX, TROPONINI in the last 168 hours. No results for input(s): PROBNP in the last 8760 hours. Coagulation Profile: No results for input(s): INR, PROTIME in the last 168 hours. Thyroid Function Tests: No results for input(s): TSH, T4TOTAL, FREET4, T3FREE, THYROIDAB in the last 72 hours. Lipid Profile: No results for input(s): CHOL, HDL, LDLCALC, TRIG, CHOLHDL, LDLDIRECT in the last 72 hours. Anemia Panel: No results for input(s): VITAMINB12, FOLATE, FERRITIN, TIBC, IRON, RETICCTPCT in the last 72 hours. Urine analysis:    Component Value Date/Time   COLORURINE YELLOW 04/17/2019 0051   APPEARANCEUR CLEAR 04/17/2019 0051   LABSPEC 1.005 04/17/2019 0051   PHURINE 7.0 04/17/2019 0051   GLUCOSEU NEGATIVE 04/17/2019 0051   HGBUR SMALL (A) 04/17/2019 0051   BILIRUBINUR neg  08/19/2019 [place] 04/17/2019 0051   PROTEINUR Negative 08/19/2019 1018   PROTEINUR NEGATIVE 04/17/2019 0051   UROBILINOGEN 1.0 08/19/2019 1018   UROBILINOGEN 0.2 12/12/2012 0920   NITRITE neg 08/19/2019 1018   NITRITE NEGATIVE 04/17/2019 0051   LEUKOCYTESUR Negative 08/19/2019 1018   LEUKOCYTESUR NEGATIVE 04/17/2019 0051   Sepsis Labs: Invalid input(s): PROCALCITONIN, [place]  Microbiology: No results found for this or any previous visit (from the past 240 hour(s)).  Radiology Studies: No results found.    Arie Powell T. [place]  If 7PM-7AM, please contact night-coverage [email or web address] Password [facility] 10/22/2019, 12:28 PM

## 2019-10-22 NOTE — Progress Notes (Signed)
Progress Note  Patient Name: [name] Date of Encounter: 10/22/2019  Primary Cardiologist: Mertie Moores, MD   Subjective   Pt denies CP or dyspnea  Inpatient Medications    Scheduled Meds: . brimonidine  1 drop Both Eyes Daily  . cycloSPORINE  1 drop Both Eyes BID  . diltiazem  120 mg Oral Daily  . escitalopram  20 mg Oral Daily  . furosemide  40 mg Oral BID  . hydroxypropyl methylcellulose / hypromellose  1 drop Both Eyes BID  . insulin aspart  0-9 Units Subcutaneous Q4H  . insulin glargine  20 Units Subcutaneous QHS  . insulin glargine  40 Units Subcutaneous Daily  . ipratropium-albuterol  3 mL Nebulization TID  . levothyroxine  100 mcg Oral Daily  . metoprolol tartrate  50 mg Oral BID  . mometasone-formoterol  1 puff Inhalation BID  . montelukast  10 mg Oral QPM  . pantoprazole  20 mg Oral BID  . predniSONE  20 mg Oral Q breakfast  . rifaximin  200 mg Oral BID  . sodium chloride flush  3 mL Intravenous Q12H  . spironolactone  50 mg Oral Daily   Continuous Infusions: . sodium chloride     PRN Meds: sodium chloride, acetaminophen **OR** acetaminophen, guaiFENesin-dextromethorphan, HYDROcodone-acetaminophen, levalbuterol, ondansetron **OR** ondansetron (ZOFRAN) IV, sodium chloride flush   Vital Signs    Vitals:   10/21/19 2014 10/21/19 2202 10/22/19 0728 10/22/19 0736  BP: (!) 83/69  (!) 117/96   Pulse: 92  (!) 131   Resp: 18  18   Temp: (!) 97.4 F (36.3 C)  (!) 96.7 F (35.9 C)   TempSrc: Oral     SpO2: 96% 98% 99% 99%  Weight:      Height:        Intake/Output Summary (Last 24 hours) at 10/22/2019 0759 Last data filed at 10/21/2019 1300 Gross per 24 hour  Intake 480 ml  Output -  Net 480 ml   Last 3 Weights 10/20/2019 10/19/2019 10/17/2019  Weight (lbs) 181 lb 183 lb 10.3 oz 191 lb 2.2 oz  Weight (kg) 82.1 kg 83.3 kg 86.7 kg      Telemetry    Atrial fibrillation with RVR- Personally Reviewed  Physical Exam   GEN: WD NAD Neck: supple, no  JVD Cardiac: irregular, tachycardic Respiratory: CTA; no rhonchi GI: Soft, NT, positive fluid wave, no masses MS: No edema Neuro:  Grossly intact Psych: Normal affect   Labs    High Sensitivity Troponin:   Recent Labs  Lab 10/14/19 1940 10/14/19 2128  TROPONINIHS 8 8      Chemistry Recent Labs  Lab 10/20/19 0925 10/21/19 0910 10/22/19 0158  NA 134* 133* 134*  K 4.7 4.2 4.8  CL 97* 95* 95*  CO2 28 26 27   GLUCOSE 251* 109* 266*  BUN 22 24* 24*  CREATININE 1.00 1.01* 1.15*  CALCIUM 9.0 9.2 9.0  ALBUMIN  --   --  3.0*  GFRNONAA 52* 51* 44*  GFRAA >60 60* 51*  ANIONGAP 9 12 12      Hematology Recent Labs  Lab 10/16/19 0553 10/19/19 0338 10/22/19 0158  WBC 8.2 4.5 7.3  RBC 4.06 4.18 4.13  HGB 12.5 12.8 12.8  HCT 38.6 39.7 38.6  MCV 95.1 95.0 93.5  MCH 30.8 30.6 31.0  MCHC 32.4 32.2 33.2  RDW 14.1 13.6 13.8  PLT 90* 55* 64*    Patient Profile     86 y.o. female with a hx  of CAD s/p DES RCA and latertheof LAD, COPD/ILD, chronic combined CHF, morbid obesity, DM2, SVT, nonalcoholic cirrhosis, OSA not on CPAP, hypothyroidism, Paroxysmal afib not on anticoagulationwho is being seen for the evaluation of Afib RVR.  Echocardiogram February 2021 showed normal LV function, mild right ventricular enlargement, mild mitral regurgitation.  Assessment & Plan    1 persistent atrial fibrillation-heart rate is increased this morning.  Continue metoprolol and Cardizem.  Blood pressure is borderline and I do not think she will tolerate higher doses of beta-blocker or calcium blocker.  I will add digoxin.  Given history of cirrhosis I would like to avoid amiodarone.  Patient is not a candidate for anticoagulation given history of cirrhosis, esophageal varices and thrombocytopenia.  2 history of chronic diastolic congestive heart failure/ascites-continue Lasix and spironolactone at present dose.  We will continue to follow volume status and adjust regimen as needed.  Follow  renal function.  3 coronary artery disease-patient is not on aspirin; this will need to be reviewed by Dr. Acie Fredrickson following discharge.  Patient is not on a statin due to history of cirrhosis.  Signed, Kirk Ruths, MD  10/22/2019, 7:59 AM

## 2019-10-22 NOTE — Plan of Care (Signed)
  Problem: Activity: Goal: Ability to tolerate increased activity will improve Outcome: Progressing  Pt has been moving about her room independently with no reported shortness of breath. She reports she is feeling good. Pt was able to stand for extended period of time to perform her own bath this morning.  Problem: Cardiac: Goal: Ability to achieve and maintain adequate cardiopulmonary perfusion will improve Outcome: Progressing  Pt heart rate was elevated this morning but digoxin was given and her heart rate returned to within normal limits. Pt denied chest pain or shortness of breath. She is still having an irregular rhythm of afib.

## 2019-10-22 NOTE — Plan of Care (Signed)

## 2019-10-23 ENCOUNTER — Telehealth: Payer: Self-pay | Admitting: Cardiovascular Disease

## 2019-10-23 DIAGNOSIS — R5381 Other malaise: Secondary | ICD-10-CM

## 2019-10-23 LAB — RENAL FUNCTION PANEL
Albumin: 3.2 g/dL — ABNORMAL LOW (ref 3.5–5.0)
Anion gap: 11 (ref 5–15)
BUN: 19 mg/dL (ref 8–23)
CO2: 27 mmol/L (ref 22–32)
Calcium: 9.3 mg/dL (ref 8.9–10.3)
Chloride: 97 mmol/L — ABNORMAL LOW (ref 98–111)
Creatinine, Ser: 1.02 mg/dL — ABNORMAL HIGH (ref 0.44–1.00)
GFR calc Af Amer: 59 mL/min — ABNORMAL LOW (ref >60)
GFR calc non Af Amer: 51 mL/min — ABNORMAL LOW (ref >60)
Glucose, Bld: 147 mg/dL — ABNORMAL HIGH (ref 70–99)
Phosphorus: 4.1 mg/dL (ref 2.5–4.6)
Potassium: 4.6 mmol/L (ref 3.5–5.1)
Sodium: 135 mmol/L (ref 135–145)

## 2019-10-23 LAB — CBC
HCT: 40 % (ref 36.0–46.0)
Hemoglobin: 13.3 g/dL (ref 12.0–15.0)
MCH: 30.6 pg (ref 26.0–34.0)
MCHC: 33.3 g/dL (ref 30.0–36.0)
MCV: 92 fL (ref 80.0–100.0)
Platelets: 66 10*3/uL — ABNORMAL LOW (ref 150–400)
RBC: 4.35 MIL/uL (ref 3.87–5.11)
RDW: 13.6 % (ref 11.5–15.5)
WBC: 7.3 10*3/uL (ref 4.0–10.5)
nRBC: 0 % (ref 0.0–0.2)

## 2019-10-23 LAB — GLUCOSE, CAPILLARY
Glucose-Capillary: 116 mg/dL — ABNORMAL HIGH (ref 70–99)
Glucose-Capillary: 98 mg/dL (ref 70–99)

## 2019-10-23 LAB — MAGNESIUM: Magnesium: 1.9 mg/dL (ref 1.7–2.4)

## 2019-10-23 MED ORDER — DIGOXIN 125 MCG PO TABS: 0.1250 mg | ORAL_TABLET | Freq: Every day | ORAL | 1 refills | Status: DC

## 2019-10-23 MED ORDER — ANORO ELLIPTA 62.5-25 MCG/INH IN AEPB: 1.0000 | INHALATION_SPRAY | Freq: Every day | RESPIRATORY_TRACT | 1 refills | Status: DC

## 2019-10-23 MED ORDER — DILTIAZEM HCL ER COATED BEADS 120 MG PO CP24: 120.0000 mg | ORAL_CAPSULE | Freq: Every day | ORAL | 1 refills | Status: DC

## 2019-10-23 MED ORDER — INSULIN ASPART FLEXPEN 100 UNIT/ML ~~LOC~~ SOPN: 15.0000 [IU] | PEN_INJECTOR | Freq: Two times a day (BID) | SUBCUTANEOUS | Status: DC

## 2019-10-23 MED ORDER — FUROSEMIDE 40 MG PO TABS: ORAL_TABLET | ORAL | 1 refills | Status: DC

## 2019-10-23 MED ORDER — METOPROLOL TARTRATE 50 MG PO TABS: 50.0000 mg | ORAL_TABLET | Freq: Two times a day (BID) | ORAL | 1 refills | Status: DC

## 2019-10-23 MED FILL — FUROSEMIDE 40 MG TABLET: 40 | 90 days supply | Qty: 180 | Fill #0

## 2019-10-23 MED FILL — ANORO ELLIPTA 62.5-25 MCG I: 62.5-25 | 30 days supply | Qty: 60 | Fill #0

## 2019-10-23 MED FILL — DIGOXIN 0.125 MG TABLET: 125 | 30 days supply | Qty: 30 | Fill #0

## 2019-10-23 MED FILL — METOPROLOL TARTRATE 50 MG T: 50 | 90 days supply | Qty: 180 | Fill #0

## 2019-10-23 MED FILL — CARTIA XT 120 MG CP24: 120 | 90 days supply | Qty: 90 | Fill #0

## 2019-10-23 NOTE — Telephone Encounter (Signed)
**Note De-Identified  Obfuscation** The pt is currently in the hospital. We will monitor her chart and call her once she has been discharged.

## 2019-10-23 NOTE — Plan of Care (Signed)
Care plan goals met. Pt adequate for discharge.  

## 2019-10-23 NOTE — Progress Notes (Addendum)
Progress Note  Patient Name: [name] Date of Encounter: 10/23/2019  Primary Cardiologist: Mertie Moores, MD   Subjective   No chest pain or SOB, walking around room   Inpatient Medications    Scheduled Meds: . brimonidine  1 drop Both Eyes Daily  . cycloSPORINE  1 drop Both Eyes BID  . digoxin  0.125 mg Oral Daily  . diltiazem  120 mg Oral Daily  . escitalopram  20 mg Oral Daily  . furosemide  40 mg Oral Daily  . hydroxypropyl methylcellulose / hypromellose  1 drop Both Eyes BID  . insulin aspart  0-15 Units Subcutaneous TID WC  . insulin aspart  0-5 Units Subcutaneous QHS  . insulin glargine  20 Units Subcutaneous QHS  . insulin glargine  40 Units Subcutaneous Daily  . ipratropium-albuterol  3 mL Nebulization TID  . levothyroxine  100 mcg Oral Daily  . metoprolol tartrate  50 mg Oral BID  . mometasone-formoterol  1 puff Inhalation BID  . montelukast  10 mg Oral QPM  . pantoprazole  20 mg Oral BID  . rifaximin  200 mg Oral BID  . sodium chloride flush  3 mL Intravenous Q12H  . spironolactone  50 mg Oral Daily   Continuous Infusions: . sodium chloride     PRN Meds: sodium chloride, acetaminophen **OR** acetaminophen, guaiFENesin-dextromethorphan, HYDROcodone-acetaminophen, levalbuterol, ondansetron **OR** ondansetron (ZOFRAN) IV, sodium chloride flush   Vital Signs    Vitals:   10/22/19 2032 10/22/19 2108 10/22/19 2312 10/23/19 0740  BP:  100/77 (!) 105/51   Pulse:   78   Resp:   18   Temp:   97.8 F (36.6 C)   TempSrc:   Oral   SpO2: 97%  98% 98%  Weight:      Height:        Intake/Output Summary (Last 24 hours) at 10/23/2019 0805 Last data filed at 10/22/2019 1025 Gross per 24 hour  Intake -  Output 300 ml  Net -300 ml   Last 3 Weights 10/20/2019 10/19/2019 10/17/2019  Weight (lbs) 181 lb 183 lb 10.3 oz 191 lb 2.2 oz  Weight (kg) 82.1 kg 83.3 kg 86.7 kg      Telemetry    A fib elevated last pm around 9-10 pm but has been controlled since -  Personally Reviewed   Physical Exam   GEN: No acute distress.   Neck: No JVD sitting up in chair Cardiac: irreg irreg, no murmurs, rubs, or gallops.  Respiratory: Clear to auscultation bilaterally. GI: Soft, nontender, non-distended  MS: No edema; No deformity. Neuro:  Nonfocal  Psych: Normal affect   Labs    High Sensitivity Troponin:   Recent Labs  Lab 10/14/19 1940 10/14/19 2128  TROPONINIHS 8 8      Chemistry Recent Labs  Lab 10/21/19 0910 10/22/19 0158 10/23/19 0209  NA 133* 134* 135  K 4.2 4.8 4.6  CL 95* 95* 97*  CO2 26 27 27   GLUCOSE 109* 266* 147*  BUN 24* 24* 19  CREATININE 1.01* 1.15* 1.02*  CALCIUM 9.2 9.0 9.3  ALBUMIN  --  3.0* 3.2*  GFRNONAA 51* 44* 51*  GFRAA 60* 51* 59*  ANIONGAP 12 12 11      Hematology Recent Labs  Lab 10/19/19 0338 10/22/19 0158 10/23/19 0209  WBC 4.5 7.3 7.3  RBC 4.18 4.13 4.35  HGB 12.8 12.8 13.3  HCT 39.7 38.6 40.0  MCV 95.0 93.5 92.0  MCH 30.6 31.0 30.6  MCHC 32.2  33.2 33.3  RDW 13.6 13.8 13.6  PLT 55* 64* 66*    Cardiac Studies   ECHO 10/15/19 IMPRESSIONS    1. Left ventricular ejection fraction, by estimation, is 55 to 60%. The  left ventricle has normal function. The left ventricle has no regional  wall motion abnormalities. Unable to assess LV diastolic filling due ot  underlying atrial fibrillation.  2. Right ventricular systolic function is normal. The right ventricular  size is mildly enlarged.  3. The mitral valve is degenerative. There is mild thickening of the  mitral valve leaflet(s). There is mild calcification of the mitral valve  leaflet(s). Normal mobility of the mitral valve leaflets. Moderate mitral  annular calcification. Mild mitral  valve regurgitation. Mild mitral valve regurgitation. No evidence of  mitral stenosis.  4. The aortic valve is normal in structure and function. Aortic valve  regurgitation is not visualized. No aortic stenosis is present.   FINDINGS  Left  Ventricle: Left ventricular ejection fraction, by estimation, is 55  to 60%. The left ventricle has normal function. The left ventricle has no  regional wall motion abnormalities. The left ventricular internal cavity  size was normal in size. There is  no left ventricular hypertrophy. Unable to assess LV diastolic filling  due ot underlying atrial fibrillation.   Right Ventricle: The right ventricular size is mildly enlarged. No  increase in right ventricular wall thickness. Right ventricular systolic  function is normal.   Left Atrium: Left atrial size was normal in size.   Right Atrium: Right atrial size was normal in size.   Pericardium: There is no evidence of pericardial effusion.   Mitral Valve: The mitral valve is degenerative in appearance. There is  mild thickening of the mitral valve leaflet(s). There is mild  calcification of the mitral valve leaflet(s). Normal mobility of the  mitral valve leaflets. Moderate mitral annular  calcification. Mild mitral valve regurgitation. No evidence of mitral  valve stenosis.   Tricuspid Valve: The tricuspid valve is normal in structure. Tricuspid  valve regurgitation is not demonstrated. No evidence of tricuspid  stenosis.   Aortic Valve: The aortic valve is normal in structure and function. Aortic  valve regurgitation is not visualized. No aortic stenosis is present.   Pulmonic Valve: The pulmonic valve was normal in structure. Pulmonic valve  regurgitation is trivial. No evidence of pulmonic stenosis.   Aorta: The aortic root is normal in size and structure.   Venous: The inferior vena cava was not well visualized.   IAS/Shunts: No atrial level shunt detected by color flow Doppler.     LEFT VENTRICLE  PLAX 2D  LVIDd:     3.50 cm  LVIDs:     2.70 cm  LV PW:     1.10 cm  LV IVS:    1.10 cm  LVOT diam:   1.80 cm  LV SV:     27  LV SV Index:  14  LVOT Area:   2.54 cm     LEFT ATRIUM        Index    RIGHT ATRIUM      Index  LA diam:    3.90 cm 2.03 cm/m RA Area:   17.60 cm  LA Vol (A2C):  36.7 ml 19.09 ml/m RA Volume:  47.50 ml 24.71 ml/m  LA Vol (A4C):  48.1 ml 25.02 ml/m  LA Biplane Vol: 42.8 ml 22.26 ml/m  AORTIC VALVE  LVOT Vmax:  67.50 cm/s  LVOT Vmean: 50.700 cm/s  LVOT VTI:  0.106 m    AORTA  Ao Root diam: 2.80 cm   MITRAL VALVE        TRICUSPID VALVE  MV Area (PHT): 2.80 cm   TR Peak grad:  28.9 mmHg  MV Decel Time: 271 msec   TR Vmax:    269.00 cm/s  MV E velocity: 118.00 cm/s               SHUNTS               Systemic VTI: 0.11 m               Systemic Diam: 1.80 cm   Patient Profile     86 y.o. female with a hx of CAD s/p DES RCA and latertheof LAD, COPD/ILD, chronic combined CHF, morbid obesity, DM2, SVT, nonalcoholic cirrhosis, OSA not on CPAP, hypothyroidism, Paroxysmal afib not on anticoagulationwho is being seen for the evaluation of Afib RVR.  Echocardiogram February 2021 showed normal LV function, mild right ventricular enlargement, mild mitral regurgitation.  Assessment & Plan    1 persistent atrial fibrillation-patient's heart rate is much improved today.  Continue Cardizem, metoprolol and digoxin at present dose.  As outlined previously I have avoided amiodarone given history of cirrhosis.  She is not a candidate for anticoagulation given history of cirrhosis, esophageal varices and thrombocytopenia.    2 history of chronic diastolic congestive heart failure/ascites-continue Lasix and spironolactone at present dose.  Check potassium and renal function 1 week following discharge.  3 coronary artery disease-patient is not on aspirin; this will need to be reviewed by Dr. Acie Fredrickson following discharge.  Patient is not on a statin due to history of cirrhosis.  Patient can be discharged from a cardiac standpoint. [facility] will sign off.    Medication Recommendations: Continue present medications as listed in MAR. Other recommendations (labs, testing, etc): Check potassium, renal function and digoxin level (prior to taking digoxin that day) in 1 week. Follow up as an outpatient:  appt with Richardson Dopp, [place] 11/03/19 at 1115  For questions or updates, please contact [place] Please consult [email or web address] for contact info under        Signed, Cecilie Kicks, NP  10/23/2019, 8:05 AM

## 2019-10-23 NOTE — TOC Transition Note (Signed)
Transition of Care [facility]) - CM/SW Discharge Note   Patient Details  Name: [name] MRN: [id number] Date of Birth: 17-Jul-1934  [facility] CM/SW Contact:  Maryclare Labrador, RN Phone Number: 10/23/2019, 8:11 AM   Clinical Narrative:    Pt deemed stable for discharge home today.  Pt confirms her family will pick her up at discharge and will also provide the 24 hour supervision initially as recommended.  CM informed Alvis Lemmings that pt will discharge home today and that [facility] order is written for OT and PT.  Pt confirms she will make her up follow up appt at PCP.  NO other CM needs - CM signing off   Final next level of care: [place] Barriers to Discharge: Barriers Resolved   Patient Goals and CMS Choice Patient states their goals for this hospitalization and ongoing recovery are:: Pt would like to be able to stop wheezing , "I wasn't doing this before" CMS Medicare.gov Compare Post Acute Care list provided to:: Patient Choice offered to / list presented to : Patient  Discharge Placement                       Discharge Plan and Services                          HH Arranged: PT [facility] Agency: [facility] Date [place]: 10/16/19 Time [place]: [id number] Representative spoke with at [place]: [place] ([place]) Interventions     Readmission Risk Interventions Readmission Risk Prevention Plan 10/16/2019  Transportation Screening Complete  PCP or Specialist Appt within 3-5 Days Patient refused  [place] or Home Care Consult Complete  Some recent data might be hidden

## 2019-10-23 NOTE — Discharge Summary (Signed)
Physician Discharge Summary  [name] Un OZD:[id number] DOB: [birth year] DOA: 10/14/2019  PCP: Mellody Dance, DO  Admit date: 10/14/2019 Discharge date: 10/23/2019  Admitted From: Home Disposition: Home  Recommendations for Outpatient Follow-up:  1. Follow ups as below. 2. Please obtain CBC/BMP/Mag in 1 week 3. Please follow up on the following pending results: None  Home Health: PT/OT Equipment/Devices: Patient has appropriate DME at home  Discharge Condition: Stable CODE STATUS: Full code  Follow-up Information    Mellody Dance, DO. Schedule an appointment as soon as possible for a visit in 1 week(s).   Specialty: Family Medicine Contact information: [place] [address] [phone]        Nahser, Wonda Cheng, MD. Schedule an appointment as soon as possible for a visit in 2 week(s).   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 fib with RVR.   Cardiology consulted.  Patient was started on Cardizem drip for rate control, and IV Lasix for fluid overload.  Later, metoprolol and digoxin added for better rate control.  She was not a candidate for amiodarone and anticoagulation due to cirrhosis. There was not enough fluid for paracentesis.    Patient developed wheezing and was started on systemic steroid and bronchodilator for COPD exacerbation with improvement in his symptoms.   On the day of discharge, she was rate controlled, and stable on home 2 L by nasal cannula.  See individual problem list below for more hospital  course.  Discharge Diagnoses:  A. fib with RVR: HR in 70s and 80s -Continue p.o. Cardizem CD, metoprolol and digoxin -Not a candidate for anticoagulation given history of esophageal varices and thrombocytopenia -Recheck BMP and magnesium in 1 week -Outpatient follow-up with cardiology in about 2 weeks  Acute on chronic diastolic CHF: Echo with EF of 55 to 60%.  Appears euvolemic.  I&O incomplete.   Weight down from 191 pounds to 181 pounds.  Creatinine improved. -Discharged on oral Lasix 40 mg daily with an option to take additional 40 mg as needed -Continue Aldactone 100 mg daily -Counseled on sodium and fluid restriction as below -Outpatient follow-up with PCP and cardiology as above -Reassess fluid status and check  renal function and magnesium at follow-up  COPD exacerbation: Exacerbation resolved. -Completed prednisone course. -Anoro Ellipta with as needed bronchodilator's  Acute on chronic respiratory failure with hypoxia-multifactorial including diastolic CHF, A. fib with RVR and COPD exacerbation.   Treated accordingly as above.  Now Stable on home 2 L by [place].  AKI on CKD-3a- baseline Cr ~1.0> 0.92 (admit)> 1.34> 1.02 -Diuretics as above. -Check renal function in 1 week.  Hyperkalemia: Resolved  NASH Cirrhosis: No significant ascites for paracentesis.  No concern for SBP -Diuretics as above  Chronic CAD: Stable.  No anginal symptoms. -Beta-blocker as above  Uncontrolled DM-2 with hypoglycemia and hyperglycemia: A1c 7.9%.  Hyperglycemia likely due to steroids. Required 70 to 80 units in the last 24 hours. Recent Labs    10/22/19 2324 10/23/19 0445 10/23/19 0851  GLUCAP 192* 116* 98  -Discharged on home insulin as below.  Hypothyroidism -Continue home Synthroid  Debility/physical deconditioning -PT/OT-HH PT     Discharge Instructions  Discharge Instructions    (HEART FAILURE PATIENTS) Call MD:  Anytime you have any of the following symptoms: 1) 3  pound weight gain in 24 hours or 5 pounds in 1 week 2) shortness of breath, with or without a dry hacking cough 3) swelling in the hands, feet or stomach 4) if you have to sleep on extra pillows at night in order to breathe.   Complete by: As directed    (HEART FAILURE PATIENTS) Call MD:  Anytime you have any of the following symptoms: 1) 3 pound weight gain in 24 hours or 5 pounds in 1 week 2) shortness of breath, with or without a dry hacking cough 3) swelling in the hands, feet or stomach 4) if you have to sleep on extra pillows at night in order to breathe.   Complete by: As directed    Call MD for:  difficulty breathing, headache or visual disturbances   Complete by: As directed    Call MD for:  difficulty breathing, headache or visual disturbances   Complete by: As directed    Call MD for:  extreme fatigue   Complete by: As directed    Call MD for:  extreme fatigue   Complete by: As directed    Call MD for:  persistant dizziness or light-headedness   Complete by: As directed    Call MD for:  persistant dizziness or light-headedness   Complete by: As directed    Call MD for:  persistant nausea and vomiting   Complete by: As directed    Call MD for:  persistant nausea and vomiting   Complete by: As directed    Call MD for:  severe uncontrolled pain   Complete by: As directed    Call MD for:  temperature >100.4   Complete by: As directed    Diet - low sodium heart healthy   Complete by: As directed    Diet Carb Modified   Complete by: As directed    Discharge instructions   Complete by: As directed    It has been a pleasure taking care of you! You were hospitalized and treated for atrial fibrillation (irregular heartbeat), heart failure and COPD.  We have added some new medications and/or made adjustments to your old medications. Please review your new medication list and the directions before you take your medications.  Please follow-up with your primary care doctor and  cardiologist in 1 to 2 weeks.  Take care,   Discharge instructions   Complete by: As directed    It has been a pleasure taking care of you! You were hospitalized and treated for atrial fibrillation (irregular and fast heart rate), COPD exacerbation and congestive heart failure exacerbation. We have started you on new medications and/or made adjustments to your home medications. Please review your new medication list and the directions before you take your medications.  We encourage you to limit your fluid and salt (sodium) intake to less than 1500 cc (6 cups) and 2 g (2000 mg) a day respectively. We also encourage you to weigh yourself daily about the same time, and keep your weight log.    Please follow-up with your primary care doctor and cardiologist in 1 to 2 weeks. Take care,   Increase activity slowly   Complete by: As directed    Increase activity slowly   Complete by: As directed      Allergies as of 10/23/2019  Reactions   Ace Inhibitors Cough   Benadryl [diphenhydramine Hcl (sleep)] Other (See Comments)   hypotension   Fexofenadine Hcl Other (See Comments)   Penicillins Hives, Swelling   Swelling of arms Has patient had a PCN reaction causing immediate rash, facial/tongue/throat swelling, SOB or lightheadedness with hypotension: unknown Has patient had a PCN reaction causing severe rash involving mucus membranes or skin necrosis: no Has patient had a PCN reaction that required hospitalization: unknown Has patient had a PCN reaction occurring within the last 10 years: no, childhood allergy If all of the above answers are "NO", then may proceed with Cephalosporin use.   Sulfonamide Derivatives Swelling      Medication List    STOP taking these medications   Miconazole 3 4 % Crea Generic drug: MICONAZOLE NITRATE VAGINAL     TAKE these medications   albuterol 108 (90 Base) MCG/ACT inhaler Commonly known as: VENTOLIN HFA INHALE 2 PUFFS BY MOUTH EVERY 6 HOURS AS  NEEDED FOR WHEEZING OR SHORTNESS OF BREATH What changed: See the new instructions.   Anoro Ellipta 62.5-25 MCG/INH Aepb Generic drug: umeclidinium-vilanterol Inhale 1 puff into the lungs daily.   azelastine 0.1 % nasal spray Commonly known as: ASTELIN 1 spray each nostril twice daily after sinus rinses   brimonidine 0.2 % ophthalmic solution Commonly known as: ALPHAGAN Place 1 drop into both eyes daily.   digoxin 0.125 MG tablet Commonly known as: LANOXIN Take 1 tablet (0.125 mg total) by mouth daily.   diltiazem 120 MG 24 hr capsule Commonly known as: CARDIZEM CD Take 1 capsule (120 mg total) by mouth daily.   escitalopram 20 MG tablet Commonly known as: LEXAPRO TAKE 1 TABLET (20 MG TOTAL) BY MOUTH DAILY.   fluconazole 150 MG tablet Commonly known as: DIFLUCAN 1 p.o. now, then repeat 1 po in 7 days   FreeStyle Libre 14 Day Reader Kerrin Mo USE TO MONITOR BLOOD SUGAR 3 TIMES DAILY   FreeStyle Libre Sensor System Misc Use to monitor blood sugars TID   FreeStyle Lite Devi Use to check blood sugars 2-3 times daily   furosemide 40 MG tablet Commonly known as: LASIX Take 1 tablet by mouth in the morning daily.  You may take additional dose in the afternoon for edema/swelling What changed:   how much to take  how to take this  when to take this  additional instructions   GenTeal Tears Night-Time Oint Place 1 application into both eyes at bedtime.   Insulin Aspart FlexPen 100 UNIT/ML Sopn Inject 15 Units into the skin in the morning and at bedtime. What changed: See the new instructions.   Lantus SoloStar 100 UNIT/ML Solostar Pen Generic drug: insulin glargine Inject 40 Units into the skin 2 (two) times daily. Patient uses sliding scale for Lantus 20-60 units against medical advice   levocetirizine 5 MG tablet Commonly known as: Xyzal Take 1 tablet (5 mg total) by mouth every evening. During allergy season only   levothyroxine 100 MCG tablet Commonly known as:  SYNTHROID TAKE 1 TABLET BY MOUTH DAILY   metoprolol tartrate 50 MG tablet Commonly known as: LOPRESSOR Take 1 tablet (50 mg total) by mouth 2 (two) times daily. What changed:   medication strength  how much to take   montelukast 10 MG tablet Commonly known as: SINGULAIR TAKE 1 TABLET BY MOUTH EVERY EVENING.   nitroGLYCERIN 0.4 MG SL tablet Commonly known as: NITROSTAT Place 1 tab under the tongue every 5 minutes as needed for chest pain.  OneTouch Delica Plus [id number] Misc USE TO CHECK BLOOD SUGAR THREE TIMES A DAY   OneTouch Ultra test strip Generic drug: glucose blood TEST BLOOD SUGAR 2-3 TIMES A DAY   pantoprazole 20 MG tablet Commonly known as: PROTONIX TAKE ONE TABLET BY MOUTH TWICE DAILY   Refresh Optive Mega-3 0.5-1-0.5 % Soln Generic drug: Carboxymeth-Glyc-Polysorb PF Place 1 drop into both eyes 2 (two) times daily.   Restasis 0.05 % ophthalmic emulsion Generic drug: cycloSPORINE Place 1 drop into both eyes 2 (two) times daily. 1 drop both eyes twice daily   spironolactone 100 MG tablet Commonly known as: ALDACTONE Take 1 tablet (100 mg total) by mouth daily.   Systane Balance 0.6 % Soln Generic drug: Propylene Glycol Place 1 drop into both eyes 2 (two) times daily.   Unifine Pentips 31G X 5 MM Misc Generic drug: Insulin Pen Needle USE TO INJECT INSULIN 5 TIMES A DAY   Vision Formula/Lutein Tabs Take 1 tablet by mouth daily.   Vitamin D3 50 MCG (2000 UT) Tabs Take 1 tablet by mouth daily.       Consultations:  Cardiology  Procedures/Studies:  2D Echo on 10/15/2019 1. Left ventricular ejection fraction, by estimation, is 55 to 60%. The  left ventricle has normal function. The left ventricle has no regional  wall motion abnormalities. Unable to assess LV diastolic filling due ot  underlying atrial fibrillation.  2. Right ventricular systolic function is normal. The right ventricular  size is mildly enlarged.  3. The mitral valve is  degenerative. There is mild thickening of the  mitral valve leaflet(s). There is mild calcification of the mitral valve  leaflet(s). Normal mobility of the mitral valve leaflets. Moderate mitral  annular calcification. Mild mitral  valve regurgitation. Mild mitral valve regurgitation. No evidence of  mitral stenosis.  4. The aortic valve is normal in structure and function. Aortic valve  regurgitation is not visualized. No aortic stenosis is present.    DG Chest 1 View  Result Date: 10/18/2019 CLINICAL DATA:  Wheezing EXAM: CHEST  1 VIEW COMPARISON:  10/14/2019 FINDINGS: Cardiac enlargement without heart failure. Elevated right hemidiaphragm with right lower lobe atelectasis unchanged from the prior study. Negative for pneumonia or effusion. IMPRESSION: Right lower lobe atelectasis.  No change from the prior study Electronically Signed   By: Franchot Gallo M.D.   On: 10/18/2019 13:36   DG Chest 2 View  Result Date: 10/01/2019 CLINICAL DATA:  Shortness of breath. Weight change, again 20 pounds in the past 2 weeks. EXAM: CHEST - 2 VIEW COMPARISON:  Most recent radiograph 04/17/2019. FINDINGS: Mild cardiomegaly, similar to prior exam allowing for differences in technique. Aortic atherosclerosis. Vascular congestion. No evidence of pulmonary edema or Kerley B-lines. No pleural fluid. No focal airspace disease or pneumothorax. Degenerative change in the spine. No acute osseous abnormalities are seen. IMPRESSION: Stable cardiomegaly with mild vascular congestion. Electronically Signed   By: Keith Rake M.D.   On: 10/01/2019 13:12   DG Abd 1 View  Result Date: 10/15/2019 CLINICAL DATA:  Abdominal distension EXAM: ABDOMEN - 1 VIEW COMPARISON:  10/15/2019 FINDINGS: Two supine frontal views of the abdomen and pelvis with demonstrate an unremarkable bowel gas pattern. No masses or abnormal calcifications. Excreted contrast within the bladder. IMPRESSION: 1. Unremarkable bowel gas pattern.  Electronically Signed   By: Randa Ngo M.D.   On: 10/15/2019 02:41   CT Angio Chest PE W and/or Wo Contrast  Result Date: 10/14/2019 CLINICAL DATA:  Shortness of breath.  History of sleep apnea and prior myocardial infarct. EXAM: CT ANGIOGRAPHY CHEST WITH CONTRAST TECHNIQUE: Multidetector CT imaging of the chest was performed using the standard protocol during bolus administration of intravenous contrast. Multiplanar CT image reconstructions and MIPs were obtained to evaluate the vascular anatomy. CONTRAST:  14m OMNIPAQUE IOHEXOL 350 MG/ML SOLN COMPARISON:  10/13/2012. FINDINGS: Cardiovascular: Contrast injection is sufficient to demonstrate satisfactory opacification of the pulmonary arteries to the segmental level. There is no pulmonary embolus. There are atherosclerotic changes of the thoracic aorta without evidence for an aneurysm. The heart size is enlarged. Coronary artery calcifications are noted. There is no significant pericardial effusion. Mediastinum/Nodes: --again noted is mediastinal adenopathy. This is essentially stable since 2014. --No axillary lymphadenopathy. --No supraclavicular lymphadenopathy. --Normal thyroid gland. --The esophagus is unremarkable Lungs/Pleura: There is atelectasis at the lung bases. There is some mild bronchial wall thickening and mucus plugging at the lung bases bilaterally. There is no pneumothorax. No significant pleural effusion. The trachea is unremarkable. Again noted is subpleural reticulation consistent with a mild chronic interstitial lung disease. Upper Abdomen: There is a large volume of ascites in the partially visualized upper abdomen. The liver is cirrhotic in appearance. The spleen is enlarged. Musculoskeletal: No chest wall abnormality. No acute or significant osseous findings. Review of the MIP images confirms the above findings. IMPRESSION: 1. No evidence for acute pulmonary embolism. 2. Mild bronchial wall thickening and mucus plugging at the lung  bases bilaterally, which may reflect bronchitis. 3. Cardiomegaly with coronary artery calcifications. 4. Stable mediastinal adenopathy. 5. Cirrhotic appearance of the liver with evidence of portal hypertension including a moderate to large volume of abdominal ascites. 6. Aortic Atherosclerosis (ICD10-I70.0). Electronically Signed   By: CConstance HolsterM.D.   On: 10/14/2019 22:58   DG Chest Portable 1 View  Result Date: 10/14/2019 CLINICAL DATA:  Shortness of breath EXAM: PORTABLE CHEST 1 VIEW COMPARISON:  10/01/2019 FINDINGS: Elevation of the right diaphragm. Atelectasis at the right base. Mild cardiomegaly with aortic atherosclerosis. No consolidation, pleural effusion or pneumothorax. IMPRESSION: Cardiomegaly.  Elevated right diaphragm with basilar atelectasis. Electronically Signed   By: KDonavan FoilM.D.   On: 10/14/2019 20:00   ECHOCARDIOGRAM COMPLETE  Result Date: 10/15/2019    ECHOCARDIOGRAM REPORT   [facility] Name[facility]   [facility] 10/15/2019 Medical Rec #:  [id number]       Height:       66.0 in Accession #:    [id number]      Weight:       182.2 lb Date of Birth:  1October 06, 1935       BSA:          1.922 m Patient Age:    89years         BP:           116/61 mmHg Patient Gender: F                HR:           124 bpm. Exam Location:  Inpatient Procedure: 2D Echo Indications:    CHF 428  History:        Patient has prior history of Echocardiogram examinations, most                 recent 09/27/2018. Previous Myocardial Infarction and CAD; Risk                 Factors:Dyslipidemia, Diabetes and Former Smoker.  Sonographer:  Jannett Celestine RDCS (AE) Referring Phys: [id number] ANASTASSIA DOUTOVA  Sonographer Comments: limited mobility IMPRESSIONS  1. Left ventricular ejection fraction, by estimation, is 55 to 60%. The left ventricle has normal function. The left ventricle has no regional wall motion abnormalities. Unable to assess LV diastolic filling due ot underlying atrial fibrillation.  2.  Right ventricular systolic function is normal. The right ventricular size is mildly enlarged.  3. The mitral valve is degenerative. There is mild thickening of the mitral valve leaflet(s). There is mild calcification of the mitral valve leaflet(s). Normal mobility of the mitral valve leaflets. Moderate mitral annular calcification. Mild mitral valve regurgitation. Mild mitral valve regurgitation. No evidence of mitral stenosis.  4. The aortic valve is normal in structure and function. Aortic valve regurgitation is not visualized. No aortic stenosis is present. FINDINGS  Left Ventricle: Left ventricular ejection fraction, by estimation, is 55 to 60%. The left ventricle has normal function. The left ventricle has no regional wall motion abnormalities. The left ventricular internal cavity size was normal in size. There is  no left ventricular hypertrophy. Unable to assess LV diastolic filling due ot underlying atrial fibrillation. Right Ventricle: The right ventricular size is mildly enlarged. No increase in right ventricular wall thickness. Right ventricular systolic function is normal. Left Atrium: Left atrial size was normal in size. Right Atrium: Right atrial size was normal in size. Pericardium: There is no evidence of pericardial effusion. Mitral Valve: The mitral valve is degenerative in appearance. There is mild thickening of the mitral valve leaflet(s). There is mild calcification of the mitral valve leaflet(s). Normal mobility of the mitral valve leaflets. Moderate mitral annular calcification. Mild mitral valve regurgitation. No evidence of mitral valve stenosis. Tricuspid Valve: The tricuspid valve is normal in structure. Tricuspid valve regurgitation is not demonstrated. No evidence of tricuspid stenosis. Aortic Valve: The aortic valve is normal in structure and function. Aortic valve regurgitation is not visualized. No aortic stenosis is present. Pulmonic Valve: The pulmonic valve was normal in structure.  Pulmonic valve regurgitation is trivial. No evidence of pulmonic stenosis. Aorta: The aortic root is normal in size and structure. Venous: The inferior vena cava was not well visualized. IAS/Shunts: No atrial level shunt detected by color flow Doppler.  LEFT VENTRICLE PLAX 2D LVIDd:         3.50 cm LVIDs:         2.70 cm LV PW:         1.10 cm LV IVS:        1.10 cm LVOT diam:     1.80 cm LV SV:         27 LV SV Index:   14 LVOT Area:     2.54 cm  LEFT ATRIUM             Index       RIGHT ATRIUM           Index LA diam:        3.90 cm 2.03 cm/m  RA Area:     17.60 cm LA Vol (A2C):   36.7 ml 19.09 ml/m RA Volume:   47.50 ml  24.71 ml/m LA Vol (A4C):   48.1 ml 25.02 ml/m LA Biplane Vol: 42.8 ml 22.26 ml/m  AORTIC VALVE LVOT Vmax:   67.50 cm/s LVOT Vmean:  50.700 cm/s LVOT VTI:    0.106 m  AORTA Ao Root diam: 2.80 cm MITRAL VALVE  TRICUSPID VALVE MV Area (PHT): 2.80 cm     TR Peak grad:   28.9 mmHg MV Decel Time: 271 msec     TR Vmax:        269.00 cm/s MV E velocity: 118.00 cm/s                             SHUNTS                             Systemic VTI:  0.11 m                             Systemic Diam: 1.80 cm Fransico Him MD Electronically signed by Fransico Him MD Signature Date/Time: 10/15/2019/12:49:03 PM    Final    IR ABDOMEN US LIMITED  Result Date: 10/15/2019 CLINICAL DATA:  86 year old female with a history ascites referred for possible paracentesis EXAM: LIMITED ABDOMEN ULTRASOUND FOR ASCITES TECHNIQUE: Limited ultrasound survey for ascites was performed in all four abdominal quadrants. COMPARISON:  None. FINDINGS: Limited ultrasound demonstrates scant ascites with no safe window for access. Paracentesis not performed. IMPRESSION: Scant ascites on ultrasound survey Electronically Signed   By: Corrie Mckusick D.O.   On: 10/15/2019 12:55   US Abdomen Limited RUQ  Result Date: 10/15/2019 CLINICAL DATA:  Cirrhosis, ascites, epigastric pain EXAM: ULTRASOUND ABDOMEN LIMITED RIGHT UPPER  QUADRANT COMPARISON:  10/14/2019, 07/03/2019 FINDINGS: Gallbladder: Surgically absent Common bile duct: Diameter: 6 mm Liver: Liver is heterogeneous in echotexture with nodular contour compatible with cirrhosis. No focal hepatic abnormality. No intrahepatic biliary duct dilation. Portal vein is patent on color Doppler imaging with normal direction of blood flow towards the liver. Other: Ascites is seen within the bilateral upper quadrants, with trace fluid in the right lower quadrant. IMPRESSION: 1. Cirrhosis. 2. Upper abdominal ascites. 3. Cholecystectomy. Electronically Signed   By: Randa Ngo M.D.   On: 10/15/2019 02:19       Discharge Exam: Vitals:   10/22/19 2108 10/22/19 2312  BP: 100/77 (!) 105/51  Pulse:  78  Resp:  18  Temp:  97.8 F (36.6 C)  SpO2:  98%    GENERAL: No acute distress.  Appears well.  HEENT: MMM.  Vision and hearing grossly intact.  NECK: Supple.  No apparent JVD.  RESP: On 2 L by [place].  No IWOB. Good air movement bilaterally. CVS: Regular rhythm.  Normal rate.Marland Kitchen Heart sounds normal.  ABD/GI/GU: Bowel sounds present. Soft. Non tender.  MSK/EXT:  Moves extremities. No apparent deformity or edema.  SKIN: no apparent skin lesion or wound NEURO: Awake, alert and oriented appropriately.  No apparent focal neuro deficit. PSYCH: Calm. Normal affect.    The results of significant diagnostics from this hospitalization (including imaging, microbiology, ancillary and laboratory) are listed below for reference.     Microbiology: No results found for this or any previous visit (from the past 240 hour(s)).   Labs: BNP (last 3 results) Recent Labs    04/17/19 1624 10/01/19 1258 10/14/19 1944  BNP 150.2* 234.6* 494.4*   Basic Metabolic Panel: Recent Labs  Lab 10/19/19 0338 10/20/19 0925 10/21/19 0910 10/22/19 0158 10/23/19 0209  NA 135 134* 133* 134* 135  K 5.4* 4.7 4.2 4.8 4.6  CL 98 97* 95* 95* 97*  CO2 27 28 26 27 27   GLUCOSE 255* 251* 109* 266*  147*  BUN 22 22 24* 24* 19  CREATININE 0.93 1.00 1.01* 1.15* 1.02*  CALCIUM 9.4 9.0 9.2 9.0 9.3  MG  --   --  2.0 2.0 1.9  PHOS  --   --   --  3.8 4.1   Liver Function Tests: Recent Labs  Lab 10/22/19 0158 10/23/19 0209  ALBUMIN 3.0* 3.2*   No results for input(s): LIPASE, AMYLASE in the last 168 hours. No results for input(s): AMMONIA in the last 168 hours. CBC: Recent Labs  Lab 10/19/19 0338 10/22/19 0158 10/23/19 0209  WBC 4.5 7.3 7.3  NEUTROABS 4.0  --   --   HGB 12.8 12.8 13.3  HCT 39.7 38.6 40.0  MCV 95.0 93.5 92.0  PLT 55* 64* 66*   Cardiac Enzymes: No results for input(s): CKTOTAL, CKMB, CKMBINDEX, TROPONINI in the last 168 hours. BNP: Invalid input(s): POCBNP CBG: Recent Labs  Lab 10/22/19 1204 10/22/19 1618 10/22/19 1954 10/22/19 2324 10/23/19 0445  GLUCAP 332* 315* 276* 192* 116*   D-Dimer No results for input(s): DDIMER in the last 72 hours. Hgb A1c No results for input(s): HGBA1C in the last 72 hours. Lipid Profile No results for input(s): CHOL, HDL, LDLCALC, TRIG, CHOLHDL, LDLDIRECT in the last 72 hours. Thyroid function studies No results for input(s): TSH, T4TOTAL, T3FREE, THYROIDAB in the last 72 hours.  Invalid input(s): FREET3 Anemia work up No results for input(s): VITAMINB12, FOLATE, FERRITIN, TIBC, IRON, RETICCTPCT in the last 72 hours. Urinalysis    Component Value Date/Time   COLORURINE YELLOW 04/17/2019 0051   APPEARANCEUR CLEAR 04/17/2019 0051   LABSPEC 1.005 04/17/2019 0051   PHURINE 7.0 04/17/2019 0051   GLUCOSEU NEGATIVE 04/17/2019 0051   HGBUR SMALL (A) 04/17/2019 0051   BILIRUBINUR neg 08/19/2019 1018   KETONESUR NEGATIVE 04/17/2019 0051   PROTEINUR Negative 08/19/2019 1018   PROTEINUR NEGATIVE 04/17/2019 0051   UROBILINOGEN 1.0 08/19/2019 1018   UROBILINOGEN 0.2 12/12/2012 0920   NITRITE neg 08/19/2019 1018   NITRITE NEGATIVE 04/17/2019 0051   LEUKOCYTESUR Negative 08/19/2019 1018   LEUKOCYTESUR NEGATIVE  04/17/2019 0051   Sepsis Labs Invalid input(s): PROCALCITONIN,  WBC,  LACTICIDVEN   Time coordinating discharge: 45 minutes  SIGNED:  Mercy Riding, MD  [facility] 10/23/2019, 7:07 AM  If 7PM-7AM, please contact night-coverage [email or web address] Password [email or web address]

## 2019-10-23 NOTE — Progress Notes (Signed)
Occupational Therapy Treatment Patient Details Name: [name] MRN: [id number] DOB: 01-Nov-1933 Today's Date: 10/23/2019    History of present illness 86 y.o. female admitted on 10/14/19 for SOBDx with hypoxic respiratory failure due to cardiogenic pulmonary edema, diastolic heart failure exacerbation complicated by A-fib with RVR.  COVID 19 (-).  Pt with significant PMH of MI, LE edema, DM, CAD, non-alcoholic cirrhosis.     OT comments  Patient walking around room and organizing her personal items on arrival.  She was on 2L O2 throughout session and had no shortness of breath.  She completed standing grooming at sink as well as UB and LB bathing/dressing with modified independence.  Patient ready to leave today to discharge home.  Follow Up Recommendations  Home health OT;Supervision/Assistance - 24 hour    Equipment Recommendations  None recommended by OT    Recommendations for Other Services      Precautions / Restrictions Precautions Precautions: Fall Restrictions Weight Bearing Restrictions: No       Mobility Bed Mobility Overal bed mobility: Modified Independent                Transfers Overall transfer level: Modified independent                    Balance Overall balance assessment: Needs assistance Sitting-balance support: Feet supported;No upper extremity supported Sitting balance-Leahy Scale: Good     Standing balance support: During functional activity;No upper extremity supported Standing balance-Leahy Scale: Good                             ADL either performed or assessed with clinical judgement   ADL Overall ADL's : Needs assistance/impaired Eating/Feeding: Modified independent;Sitting   Grooming: Wash/dry hands;Wash/dry face;Oral care;Applying deodorant;Brushing hair;Modified independent;Standing   Upper Body Bathing: Modified independent;Standing   Lower Body Bathing: Modified independent;Sit to/from stand        Lower Body Dressing: Modified independent;Sit to/from stand               Functional mobility during ADLs: Supervision/safety General ADL Comments: Patient on 2L O2 with 1 extensio     Vision       Perception     Praxis      Cognition Arousal/Alertness: Awake/alert Behavior During Therapy: WFL for tasks assessed/performed Overall Cognitive Status: Within Functional Limits for tasks assessed                                 General Comments: [facility]        Exercises     Shoulder Instructions       General Comments      Pertinent Vitals/ Pain       Pain Assessment: No/denies pain  Home Living                                          Prior Functioning/Environment              Frequency  Min 2X/week        Progress Toward Goals  OT Goals(current goals can now be found in the care plan section)  Progress towards OT goals: Progressing toward goals  Acute Rehab OT Goals Patient Stated Goal: to get stronger and go home.  OT Goal Formulation:  With patient Time For Goal Achievement: 10/31/19 Potential to Achieve Goals: Good  Plan Discharge plan remains appropriate;Frequency remains appropriate    Co-evaluation                 AM-PAC OT "6 Clicks" Daily Activity     Outcome Measure   Help from another person eating meals?: None Help from another person taking care of personal grooming?: None Help from another person toileting, which includes using toliet, bedpan, or urinal?: None Help from another person bathing (including washing, rinsing, drying)?: None Help from another person to put on and taking off regular upper body clothing?: None Help from another person to put on and taking off regular lower body clothing?: None 6 Click Score: 24    End of Session    OT Visit Diagnosis: Muscle weakness (generalized) (M62.81);Unsteadiness on feet (R26.81)   Activity Tolerance Patient tolerated treatment well    Patient Left in chair;with call bell/phone within reach   Nurse Communication Mobility status        Time: 7619-5093 OT Time Calculation (min): 16 min  Charges: OT General Charges $OT Visit: 1 Visit OT Treatments $Self Care/Home Management : 8-22 mins  August Luz, OTR/L   Phylliss Bob 10/23/2019, 10:15 AM

## 2019-10-23 NOTE — Progress Notes (Signed)
Patient was stable at discharge. I removed her IV. We reviewed the discharge education. Patient verbalized understanding and had no further questions. Patient left with prescriptions and belongings in hand.

## 2019-10-23 NOTE — Telephone Encounter (Signed)
New Message  Per Lattie Haw, set up [facility] visit for 11/03/19 at 11:15 am with Margaret Pyle

## 2019-10-23 NOTE — Progress Notes (Signed)
Physical Therapy Treatment Patient Details Name: [name] MRN: [id number] DOB: Apr 24, 1934 Today's Date: 10/23/2019    History of Present Illness 86 y.o. female admitted on 10/14/19 for SOBDx with hypoxic respiratory failure due to cardiogenic pulmonary edema, diastolic heart failure exacerbation complicated by A-fib with RVR.  COVID 19 (-).  Pt with significant PMH of MI, LE edema, DM, CAD, non-alcoholic cirrhosis.      PT Comments    Pt making good progress.  Advanced to ambulation without AD but pt had mild instability with head turns and in tight areas.  Recommend use of RW at home.     Follow Up Recommendations  Home health PT;Supervision - Intermittent     Equipment Recommendations  None recommended by PT    Recommendations for Other Services       Precautions / Restrictions Precautions Precautions: Fall Restrictions Weight Bearing Restrictions: No    Mobility  Bed Mobility Overal bed mobility: Modified Independent             General bed mobility comments: pt at EOB upon arrival  Transfers Overall transfer level: Needs assistance Equipment used: None Transfers: Sit to/from Stand Sit to Stand: Supervision            Ambulation/Gait Ambulation/Gait assistance: Supervision;Min guard Gait Distance (Feet): 350 Feet Assistive device: None Gait Pattern/deviations: Step-through pattern;Decreased stride length Gait velocity: decreased   General Gait Details: Had pt ambulate in hall with head turns, object searching, changing speeds, and turns.  Additionally, pt navigated in room in tights spaces. Pt had no LOB but did demonstrate mild unsteadiness when navigating around objects and with head turns.  Recommend use of RW at home.   Stairs             Wheelchair Mobility    Modified Rankin (Stroke Patients Only)       Balance Overall balance assessment: Needs assistance Sitting-balance support: Feet supported;No upper extremity  supported Sitting balance-Leahy Scale: Normal     Standing balance support: During functional activity;No upper extremity supported Standing balance-Leahy Scale: Good Standing balance comment: Pt was able to peform funcitional activities in standing (finding items in purse, placing items in closet) without LOB                            Cognition Arousal/Alertness: Awake/alert Behavior During Therapy: WFL for tasks assessed/performed Overall Cognitive Status: Within Functional Limits for tasks assessed                                 General Comments: HOH      Exercises      General Comments        Pertinent Vitals/Pain Pain Assessment: No/denies pain    Home Living                      Prior Function            PT Goals (current goals can now be found in the care plan section) Acute Rehab PT Goals Patient Stated Goal: to get stronger and go home.  Progress towards PT goals: Progressing toward goals    Frequency    Min 3X/week      PT Plan Current plan remains appropriate    Co-evaluation              AM-PAC PT "6 Clicks" Mobility  Outcome Measure  Help needed turning from your back to your side while in a flat bed without using bedrails?: None Help needed moving from lying on your back to sitting on the side of a flat bed without using bedrails?: None Help needed moving to and from a bed to a chair (including a wheelchair)?: None Help needed standing up from a chair using your arms (e.g., wheelchair or bedside chair)?: None Help needed to walk in hospital room?: None Help needed climbing 3-5 steps with a railing? : A Little 6 Click Score: 23    End of Session Equipment Utilized During Treatment: Oxygen;Gait belt Activity Tolerance: Patient tolerated treatment well Patient left: with call bell/phone within reach;in bed(RN in room) Nurse Communication: Mobility status PT Visit Diagnosis: Muscle weakness  (generalized) (M62.81);Difficulty in walking, not elsewhere classified (R26.2)     Time: 8638-1771 PT Time Calculation (min) (ACUTE ONLY): 12 min  Charges:  $Gait Training: 8-22 mins                     Maggie Font, PT Acute Rehab Services Pager [phone] [facility] [phone]    Karlton Lemon 10/23/2019, 11:03 AM

## 2019-10-24 DIAGNOSIS — R161 Splenomegaly, not elsewhere classified: Secondary | ICD-10-CM | POA: Diagnosis not present

## 2019-10-24 DIAGNOSIS — G930 Cerebral cysts: Secondary | ICD-10-CM | POA: Diagnosis not present

## 2019-10-24 DIAGNOSIS — I252 Old myocardial infarction: Secondary | ICD-10-CM | POA: Diagnosis not present

## 2019-10-24 DIAGNOSIS — I0981 Rheumatic heart failure: Secondary | ICD-10-CM | POA: Diagnosis not present

## 2019-10-24 DIAGNOSIS — H5461 Unqualified visual loss, right eye, normal vision left eye: Secondary | ICD-10-CM | POA: Diagnosis not present

## 2019-10-24 DIAGNOSIS — K7581 Nonalcoholic steatohepatitis (NASH): Secondary | ICD-10-CM | POA: Diagnosis not present

## 2019-10-24 DIAGNOSIS — I8500 Esophageal varices without bleeding: Secondary | ICD-10-CM | POA: Diagnosis not present

## 2019-10-24 DIAGNOSIS — D61818 Other pancytopenia: Secondary | ICD-10-CM | POA: Diagnosis not present

## 2019-10-24 DIAGNOSIS — I4819 Other persistent atrial fibrillation: Secondary | ICD-10-CM | POA: Diagnosis not present

## 2019-10-24 DIAGNOSIS — D72819 Decreased white blood cell count, unspecified: Secondary | ICD-10-CM | POA: Diagnosis not present

## 2019-10-24 DIAGNOSIS — J9621 Acute and chronic respiratory failure with hypoxia: Secondary | ICD-10-CM | POA: Diagnosis not present

## 2019-10-24 DIAGNOSIS — I2510 Atherosclerotic heart disease of native coronary artery without angina pectoris: Secondary | ICD-10-CM | POA: Diagnosis not present

## 2019-10-24 DIAGNOSIS — I951 Orthostatic hypotension: Secondary | ICD-10-CM | POA: Diagnosis not present

## 2019-10-24 DIAGNOSIS — I088 Other rheumatic multiple valve diseases: Secondary | ICD-10-CM | POA: Diagnosis not present

## 2019-10-24 DIAGNOSIS — I110 Hypertensive heart disease with heart failure: Secondary | ICD-10-CM | POA: Diagnosis not present

## 2019-10-24 DIAGNOSIS — K766 Portal hypertension: Secondary | ICD-10-CM | POA: Diagnosis not present

## 2019-10-24 DIAGNOSIS — K7460 Unspecified cirrhosis of liver: Secondary | ICD-10-CM | POA: Diagnosis not present

## 2019-10-24 DIAGNOSIS — D696 Thrombocytopenia, unspecified: Secondary | ICD-10-CM | POA: Diagnosis not present

## 2019-10-24 DIAGNOSIS — I700 Atherosclerosis of aorta: Secondary | ICD-10-CM | POA: Diagnosis not present

## 2019-10-24 DIAGNOSIS — R188 Other ascites: Secondary | ICD-10-CM | POA: Diagnosis not present

## 2019-10-24 DIAGNOSIS — J441 Chronic obstructive pulmonary disease with (acute) exacerbation: Secondary | ICD-10-CM | POA: Diagnosis not present

## 2019-10-24 DIAGNOSIS — E1140 Type 2 diabetes mellitus with diabetic neuropathy, unspecified: Secondary | ICD-10-CM | POA: Diagnosis not present

## 2019-10-24 DIAGNOSIS — I5043 Acute on chronic combined systolic (congestive) and diastolic (congestive) heart failure: Secondary | ICD-10-CM | POA: Diagnosis not present

## 2019-10-24 DIAGNOSIS — J849 Interstitial pulmonary disease, unspecified: Secondary | ICD-10-CM | POA: Diagnosis not present

## 2019-10-24 DIAGNOSIS — I493 Ventricular premature depolarization: Secondary | ICD-10-CM | POA: Diagnosis not present

## 2019-10-26 ENCOUNTER — Other Ambulatory Visit: Payer: Self-pay

## 2019-10-26 NOTE — Telephone Encounter (Signed)
**Note De-Identified  Obfuscation** TCM Call 1st attempt. No answer so I left a message asking the pt to call me back.

## 2019-10-26 NOTE — Telephone Encounter (Signed)
Follow up  ° ° °Patient is returning call.  °

## 2019-10-26 NOTE — Patient Outreach (Signed)
  [facility]) Care Management Chronic Special Needs Program  10/26/2019  Name: [name] DOB: 09-Sep-1933  MRN: [id number]  [name] is enrolled in a chronic special needs plan for Diabetes.  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  Per chart review client was admitted on 10/14/19 to [facility] with acute on chronic respiratory failure with hypoxia due to cardiogenic pulmonary edema, diastolic congestive heart failure complicated by Atrial fibrillation with RVR and COPD exacerbation.  Client was discharged on 10/23/19 and was rate controlled and stable on home O2 at 2L by nasal cannula. Client was discharged to home with [facility] home health for PT/ OT, home oxygen at 2L per nasal cannula and instructed to follow up with cardiologist and primary care provider. Per chart review client has a follow up with cardiologist scheduled for 11/03/19 and primary care provider follow up scheduled for 10/27/19.  Clients individualized care plan reviewed and updated. Transition calls to be complete by [facility] general discharge.   Goals Addressed            This Visit's Progress   . Client verbalize knowledge of Heart Failure disease self management skills within 6 months       RN case manager will send client education article: Heart failure: Keeping tract of your weight each day, Heart Failure: Working with your doctor, What is heart failure,  Heart failure: How to be salt smart. Contact your doctor for mild to moderate symptoms.  Call 911 for severe symptoms Weigh and record weights daily Adhere to low salt and fluid restrictions as recommended.  Review hospital discharge information     . General - Client will not be readmitted within 30 days (C-SNP)       Client discharged on 10/23/19 Client  will be assigned EMMI general discharge calls RN case manager will mail client education article, " Going home from the hospital" Please follow discharge instructions and call provider if you have any questions. Please attend all follow up appointments as scheduled. Please take your medications as prescribed. Please call 24 Hour nurse advice line as needed [phone]).      .   Plan:  RNCM will send updated individualized care plan to client and primary care provider. RNCM will follow up on any EMMI general discharge red flags  RNCM will outreach to client within 1-2 months or as indicated.  RNCM will send client EMMI education articles: Heart failure: Keeping tract of your weight each day, Heart failure: Working with your doctor, What is heart failure, and Heart failure How to be salt smart.    Quinn Plowman RN,BSN,CCM Chronic Care Management Coordinator [facility] [phone]    .

## 2019-10-27 ENCOUNTER — Ambulatory Visit (INDEPENDENT_AMBULATORY_CARE_PROVIDER_SITE_OTHER): Payer: HMO | Admitting: Family Medicine

## 2019-10-27 ENCOUNTER — Encounter: Payer: Self-pay | Admitting: Family Medicine

## 2019-10-27 ENCOUNTER — Other Ambulatory Visit: Payer: Self-pay

## 2019-10-27 VITALS — BP 114/70 | HR 72 | Temp 98.0°F | Resp 16 | Ht 60.0 in | Wt 171.2 lb

## 2019-10-27 DIAGNOSIS — E1165 Type 2 diabetes mellitus with hyperglycemia: Secondary | ICD-10-CM | POA: Diagnosis not present

## 2019-10-27 DIAGNOSIS — J441 Chronic obstructive pulmonary disease with (acute) exacerbation: Secondary | ICD-10-CM | POA: Diagnosis not present

## 2019-10-27 DIAGNOSIS — Z09 Encounter for follow-up examination after completed treatment for conditions other than malignant neoplasm: Secondary | ICD-10-CM | POA: Diagnosis not present

## 2019-10-27 DIAGNOSIS — I509 Heart failure, unspecified: Secondary | ICD-10-CM | POA: Diagnosis not present

## 2019-10-27 DIAGNOSIS — I4891 Unspecified atrial fibrillation: Secondary | ICD-10-CM

## 2019-10-27 DIAGNOSIS — E782 Mixed hyperlipidemia: Secondary | ICD-10-CM

## 2019-10-27 DIAGNOSIS — K7460 Unspecified cirrhosis of liver: Secondary | ICD-10-CM

## 2019-10-27 DIAGNOSIS — E1129 Type 2 diabetes mellitus with other diabetic kidney complication: Secondary | ICD-10-CM

## 2019-10-27 DIAGNOSIS — D696 Thrombocytopenia, unspecified: Secondary | ICD-10-CM

## 2019-10-27 DIAGNOSIS — R809 Proteinuria, unspecified: Secondary | ICD-10-CM

## 2019-10-27 DIAGNOSIS — E039 Hypothyroidism, unspecified: Secondary | ICD-10-CM

## 2019-10-27 DIAGNOSIS — R7989 Other specified abnormal findings of blood chemistry: Secondary | ICD-10-CM

## 2019-10-27 DIAGNOSIS — E1069 Type 1 diabetes mellitus with other specified complication: Secondary | ICD-10-CM | POA: Diagnosis not present

## 2019-10-27 DIAGNOSIS — J9620 Acute and chronic respiratory failure, unspecified whether with hypoxia or hypercapnia: Secondary | ICD-10-CM | POA: Diagnosis not present

## 2019-10-27 DIAGNOSIS — K7581 Nonalcoholic steatohepatitis (NASH): Secondary | ICD-10-CM

## 2019-10-27 NOTE — Patient Instructions (Signed)
Please ask your gastroenterologist and cardiologist about the COVID-19 vaccination, and if there are any contraindications to obtaining this during your current treatment.  For your COVID-19 vaccination, you may visit the  Department of Health and [employer] online (with the assistance of a family member if preferred), and from there you can click on [place], [place], or any other county in which you desire to receive your vaccination.  You may also sign up for COVID-19 vaccination through [place].  Please watch the news at 8 AM in the morning for further information regarding vaccine scheduling.

## 2019-10-27 NOTE — Progress Notes (Signed)
Impression and Recommendations:    1. Hospital discharge follow-up   2. Acute on chronic congestive heart failure, unspecified heart failure type (HCC)-diastolic dysfunction   3. Atrial fibrillation, unspecified type (HCC)-with RVR   4. History of recent acute on chronic respiratory failure, unspecified whether with hypoxia or hypercapnia ([place])   5. COPD exacerbation ([place])   6. Liver cirrhosis secondary to NASH ([place])   7. Poorly controlled diabetes mellitus (HCC)-management per Endo, Dr. Soyla Murphy   8. Mixed diabetic hyperlipidemia associated with type 1 diabetes mellitus ([place])   9. Microalbuminuria due to type 2 diabetes mellitus ([place])   10. Hypothyroidism, unspecified type   11. chronic Low platelet count   12. Hypocalcemia     - A1c 12 days ago was 7.9. - Patient is followed by [name] of endocrinology for diabetes management.   Hospital Discharge Follow-Up - Regarding pt's recent hospitalization and/or ED visit: reviewed in great detail recent hospitalization notes, clinical lab tests, tests in the radiology section of CPT, tests in the medicine testing of CPT, and obtained history from patient.  - Patient with no active symptoms at this time. Stable condition  - Patient tolerating new medications well without S-E or complaint.   Patient knows to follow up with cardiology and GI in near future as scheduled for follow-up of new meds/ monitoring. - Patient is currently seeing PT, OT - home therapy  - Labs obtained today to evaluate progress on medication changes since hospital discharge (including lasix).  - reviewed with patient that with CHF, swelling of the LE may occur, PND, inc DOE etc.  - reviewed with patient that with liver cirrhosis, swelling of the abdomen may occur. - Advised patient to watch for accumulation of weight in a short period of time - Patient knows to monitor for this and she will call cardiology if she gains 5 lbs within a 48 hour period.  -  Advised patient to transition very slowly from positions such as sitting to standing, and allow her blood pressure/balance to adjust.  Encouraged patient to use her walker at all times.   End-of-life counseling:  - Advised patient to work on getting her paperwork, living will, resuscitation desires in order, and have her paperwork organized sooner rather than later.    - Extensive education and counseling provided during OV today.  - Will continue to monitor alongside specialists.    Left Arm discomfort started while in [place] patient's symptoms extensively during appointment. - Advised patient that being in unusual positions in the hospital, an IV in the arm etc, w/o having any inc swelling, temp or vasc changes is likely due to muscle strain.  - told pt to address with her current PT and OT therapists as well since they are currently treating pt  - advised use of heat (such as heating pad) to inc bld flow and help relax the muscles. - Avoid activities that might aggravate the area, such as lifting heavy objects or using the affected arm to continually push up from surfaces such as bed or the couch.   --> Again, advised to work with her therapists on transfers etc.  - Patient knows to monitor for red flag cardiac symptoms as discussed during appointment today, and call 911 if she notices any of these.  - Advised patient that if needed, we may prescribe Voltaren gel, but otherwise do not advise additional medications given patient's hx - For additional relief, encouraged patient that she may  use IcyHot over the counter, or lidocaine patches.  - Advised patient that her symptoms should slowly improve, esp with txmnt and working with PT/OT - If the pain and symptoms do not slowly improve, patient will call the clinic to let us know.   - Will continue to monitor.   Need for COVID-19 Vaccination - Patient with need for COVID-19 vaccination. - Advised patient to consult  with GI and cardiology specialists regarding contraindications and safety of obtaining vaccine at this time, given her CHF and cirrhosis of the liver, as I do not see a C/I for it but I would like her to check with them - To obtain COVID-19 vaccination, advised patient to enlist her daughter or other family member for assistance and go on [employer] or the [place] [employer] [facility] website, and seek open appointments in any available county. - Patient knows to call with further concerns or questions as needed.   Recommendations - Return in 6 weeks for follow-up regarding CHF, afib, chronic respiratory failure, etc.    Orders Placed This Encounter  Procedures  . CBC  . Magnesium  . Basic Metabolic Panel (BMET)     Medications Discontinued During This Encounter  Medication Reason  . ONETOUCH ULTRA test strip Error  . Insulin Glargine (LANTUS SOLOSTAR) 100 UNIT/ML Solostar Pen Error     Expresses verbal understanding and consents to current therapy and treatment regimen.  No barriers to understanding were identified.  Red flag symptoms and signs discussed in detail.  Patient expressed understanding regarding what to do in case of emergency\urgent symptoms  Please see AVS handed out to patient at the end of our visit for further patient instructions/ counseling done pertaining to today's office visit.   Return for f/up in 6wks for CHF, Afib, [place] resp failure etc.     Note:  This note was prepared with assistance of Dragon voice recognition software. Occasional wrong-word or sound-a-like substitutions may have occurred due to the inherent limitations of voice recognition software.   The [place] was signed into law in 2016 which includes the topic of electronic health records.  This provides immediate access to information in MyChart.  This includes consultation notes, operative notes, office notes, lab results and pathology reports.  If you have any questions  about what you read please let us know at your next visit or call us at the office.  We are right here with you.   This document serves as a record of services personally performed by Mellody Dance, DO. It was created on her behalf by [name], a trained medical scribe. The creation of this record is based on the scribe's personal observations and the provider's statements to them.   This case required medical decision making of at least moderate to significant complexity. The above documentation from [name], medical scribe, has been reviewed by Marjory Sneddon, D.O.        --------------------------------------------------------------------------------------------------------------------------------------------------------------------------------------------------------------------------------------------  Subjective:     Phillips Odor, am serving as scribe for Dr.Ellin Fitzgibbons.   HPI: [name] is a 86 y.o. female who presents to [facility] today for issues as discussed below.  Admission and Discharge summaries reviewed as well as all testing, labs etc done while in hosp.   Here today for hospital follow-up.  She is currently on oxygen. She feels her breathing has improved since her discharge, and follows up with cardiology next week.  Since her discharge,  in general, say she's "doing fairly well."  Notes "still weak, but I'm able to get up."  Says "I was told not to do anything, so I'm not doing anything."  She is tolerating all new medications well.  She confirms that physical therapy and occupational therapy are coming to her house as recommended.  In terms of how many days she is following up with PT, "I think the physical therapist said two [days per week]."  Confirms she feels safe, steady on her feet at home, and able to get around on her own.  For care and assistance at home, her daughter and granddaughter  are staying with her part-time.  Notes her four children "take turns."  Someone is with her most of the day, and the evening too.  Denies concerns with CHF.  Denies new difficulty lying flat at night.  She currently uses two pillows at night which is no increase from prior and uses her oxygen- which is new.     Denies new swelling in her lower extremities.  Has not noticed her belly/abdomen swelling up or putting on more liquid like in the past.  Says "it's staying pretty flat, thank goodness."  Denies concerns with heart palpitations or atrial fibrillation.  Denies concerns with her blood pressure at home.  Denies dizziness.  Denies issues moving from sitting to standing.  Says "I don't jump up, I just take my time."  No diff with eating, urination or BM's.   She has a walker at home and uses this as needed.    - Mood since Discharge Says she still feels her mood is good.   - Sleep since Discharge Says she's sleeping okay; "probably sleeping too much."   - Eating Habits since Discharge Notes they have her on a special diet; "a sheet they gave me."  She's been following her diet.  Denies intolerances or upset stomach from anything she's eaten.  Says she's had some stomach upset/nausea "but I don't think it came from the food."  Notes her symptoms are only "a little bit, not bad enough to need something."   - Digestive Health since Discharge Confirms urinating and passing bowels normally.   - Left Arm Pain since Discharge Says she started having discomfort in her left arm while in the hosp.  Says "it just hurts" from the shoulder down into forearm.  Feels the symptoms bother her most if she tries to get up using the arm.  Says the rest of the time, she thinks the area is " just sore."    This pain has occurred only since she was admitted in the hospital.  Notes she told them that her arm was hurting then, "but they never did address it."  Her arm was not hurting before she went into  the hospital.  Thinks that her hospital visit, sleeping in strange positions, having IV's placed, etc may have contributed to her arm pain.  Does not believe the pain is located in her joints; feels it's more in the muscles- which are sore with use.  Describes the quality of pain as an "aching pain."  Denies associated chest tightness or jaw tightness etc.  She has never had this discomfort prior. She is right-handed.  In the past, when she experienced MI, notes that the pain was in her left chest, not necessarily her left arm.   - Paperwork, Living Will Patient notes she does not have her will/such paperwork in order yet, but has plans to seek  the assistance of a friend to get everything arranged.    Wt Readings from Last 3 Encounters:  10/27/19 171 lb 3.2 oz (77.7 kg)  10/20/19 181 lb (82.1 kg)  10/01/19 187 lb (84.8 kg)   BP Readings from Last 3 Encounters:  10/27/19 114/70  10/23/19 (!) 116/58  10/01/19 134/63   Pulse Readings from Last 3 Encounters:  10/27/19 72  10/23/19 (!) 53  10/01/19 64   BMI Readings from Last 3 Encounters:  10/27/19 33.44 kg/m  10/20/19 29.21 kg/m  10/01/19 36.52 kg/m     Patient Care Team    Relationship Specialty Notifications Start End  Mellody Dance, DO PCP - General Family Medicine  03/12/16   Nahser, Wonda Cheng, MD PCP - Cardiology Cardiology Admissions 10/28/18   Brand Males, MD Attending Physician Pulmonary Disease Abnormal results only, Admissions 12/07/12    Comment: severe COPD  Jacelyn Pi, MD Consulting Physician Endocrinology  05/22/17   Marice Potter, MD Consulting Physician Hematology and Oncology  10/16/18   Hayden Pedro, MD Consulting Physician Ophthalmology  11/25/18    Comment: DM retinop specialist  Isaiah Serge, NP Nurse Practitioner Cardiology  12/31/18    Comment: works with dr. Patriciaann Clan, Jeneen Rinks, MD (Inactive) Consulting Physician Gastroenterology  05/05/19    Comment: Howie Ill ;  I believe    Dannielle Karvonen, [place] [facility] Management  Admissions 10/14/19    Comment: Chronic Special Needs Plan Chronic Care Management Coordinator      Patient Active Problem List   Diagnosis Date Noted  . Paroxysmal atrial fibrillation ([place]) 10/28/2018  . Liver cirrhosis secondary to NASH ([place]) 10/16/2018  . Pancytopenia ([place]) 09/26/2018  . Acute on chronic diastolic CHF (congestive heart failure) ([place]) 09/26/2018  . Microalbuminuria due to type 2 diabetes mellitus ([place]) 08/26/2018  . chronic Low platelet count 06/05/2017  . Hypertension associated with diabetes ([place]) 05/22/2017  . Morbidly obese ([place])- BMI >er 35 w DM 06/23/2016  . chronic Leukopenia 05/11/2016  . Poorly controlled diabetes mellitus ([place]) 03/13/2016  . Mixed diabetic hyperlipidemia associated with type 1 diabetes mellitus ([place]) 03/13/2016  . ILD (interstitial lung disease) ([place]) 11/25/2012  . Elevated serum alkaline phosphatase level- hepatocellular in origin 05/11/2016  . Depression 03/13/2016  . Hypothyroidism 03/13/2016  . Thrombocytopenia, unspecified ([place]) 12/13/2012  . Coronary artery disease 04/11/2011  . COPD (chronic obstructive pulmonary disease) ([place]) 04/11/2011  . Obstructive sleep apnea 04/11/2011  . Chronic diastolic heart failure ([place]) 03/29/2008  . Gastroesophageal reflux disease 12/31/2017  . Vitamin D deficiency 05/11/2016  . Constipation, chronic/  IBS syndrome 03/13/2016  . h/o Clostridium difficile colitis 12/12/2012  . History of GI bleed 12/12/2012  . Atherosclerosis of carotid arteries 04/03/2006  . Class 1 obesity 10/19/2019  . Atrial fibrillation with RVR ([place])   . COPD with acute exacerbation ([place]) 10/14/2019  . Environmental and seasonal allergies 06/03/2019  . Ascites of liver 05/05/2019  . Portal hypertension ([place]) 05/05/2019  . Decompensated hepatic cirrhosis ([place]) 04/17/2019  . Myocardial infarct, old 01/01/2019  . COPD exacerbation ([place]) 09/26/2018  . Acute on  chronic respiratory failure with hypoxia ([place]) 09/26/2018  . PNA (pneumonia) 09/15/2018  . Hypocalcemia 03/12/2018  . At high risk for osteoporosis 03/13/2016  . Hypotension 12/13/2012  . Hypokalemia due to excessive renal loss of potassium 12/13/2012  . Leg edema 01/07/2012    Past Medical history, Surgical history, Family history, Social history, Allergies and Medications have been entered into the medical  record, reviewed and changed as needed.    Current Meds  Medication Sig  . albuterol (VENTOLIN HFA) 108 (90 Base) MCG/ACT inhaler INHALE 2 PUFFS BY MOUTH EVERY 6 HOURS AS NEEDED FOR WHEEZING OR SHORTNESS OF BREATH (Patient taking differently: Inhale 1 puff into the lungs every 6 (six) hours as needed for wheezing or shortness of breath. )  . azelastine (ASTELIN) 0.1 % nasal spray 1 spray each nostril twice daily after sinus rinses  . Blood Glucose Monitoring Suppl (FREESTYLE LITE) DEVI Use to check blood sugars 2-3 times daily  . brimonidine (ALPHAGAN) 0.2 % ophthalmic solution Place 1 drop into both eyes daily.  . Carboxymeth-Glyc-Polysorb PF (REFRESH OPTIVE MEGA-3) 0.5-1-0.5 % SOLN Place 1 drop into both eyes 2 (two) times daily.   . Cholecalciferol (VITAMIN D3) 50 MCG (2000 UT) TABS Take 1 tablet by mouth daily.  . Continuous Blood Gluc Receiver (FREESTYLE LIBRE 14 DAY READER) DEVI USE TO MONITOR BLOOD SUGAR 3 TIMES DAILY  . Continuous Blood Gluc Sensor (FREESTYLE LIBRE SENSOR SYSTEM) MISC Use to monitor blood sugars TID  . digoxin (LANOXIN) 0.125 MG tablet Take 1 tablet (0.125 mg total) by mouth daily.  Marland Kitchen diltiazem (CARDIZEM CD) 120 MG 24 hr capsule Take 1 capsule (120 mg total) by mouth daily.  Marland Kitchen escitalopram (LEXAPRO) 20 MG tablet TAKE 1 TABLET (20 MG TOTAL) BY MOUTH DAILY.  . fluconazole (DIFLUCAN) 150 MG tablet 1 p.o. now, then repeat 1 po in 7 days  . furosemide (LASIX) 40 MG tablet Take 1 tablet by mouth in the morning daily.  You may take additional dose in the afternoon for  edema/swelling  . Insulin Aspart FlexPen 100 UNIT/ML SOPN Inject 15 Units into the skin in the morning and at bedtime.  . Lancets (ONETOUCH DELICA PLUS TMHDQQ22L) MISC USE TO CHECK BLOOD SUGAR THREE TIMES A DAY  . levocetirizine (XYZAL) 5 MG tablet Take 1 tablet (5 mg total) by mouth every evening. During allergy season only  . levothyroxine (SYNTHROID) 100 MCG tablet TAKE 1 TABLET BY MOUTH DAILY  . metoprolol tartrate (LOPRESSOR) 50 MG tablet Take 1 tablet (50 mg total) by mouth 2 (two) times daily.  . montelukast (SINGULAIR) 10 MG tablet TAKE 1 TABLET BY MOUTH EVERY EVENING.  . Multiple Vitamins-Minerals (VISION FORMULA/LUTEIN) TABS Take 1 tablet by mouth daily.  . nitroGLYCERIN (NITROSTAT) 0.4 MG SL tablet Place 1 tab under the tongue every 5 minutes as needed for chest pain.  . pantoprazole (PROTONIX) 20 MG tablet TAKE ONE TABLET BY MOUTH TWICE DAILY (Patient taking differently: Take 20 mg by mouth daily. Per Dr. Darden Palmer reported)  . Propylene Glycol (SYSTANE BALANCE) 0.6 % SOLN Place 1 drop into both eyes 2 (two) times daily.   . RESTASIS 0.05 % ophthalmic emulsion Place 1 drop into both eyes 2 (two) times daily. 1 drop both eyes twice daily  . spironolactone (ALDACTONE) 100 MG tablet Take 1 tablet (100 mg total) by mouth daily.  Marland Kitchen umeclidinium-vilanterol (ANORO ELLIPTA) 62.5-25 MCG/INH AEPB Inhale 1 puff into the lungs daily.  Marland Kitchen UNIFINE PENTIPS 31G X 5 MM MISC USE TO INJECT INSULIN 5 TIMES A DAY  . White Petrolatum-Mineral Oil (GENTEAL TEARS NIGHT-TIME) OINT Place 1 application into both eyes at bedtime.     Allergies:  Allergies  Allergen Reactions  . Ace Inhibitors Cough  . Benadryl [Diphenhydramine Hcl (Sleep)] Other (See Comments)    hypotension  . Fexofenadine Hcl Other (See Comments)  . Penicillins Hives and Swelling  Swelling of arms Has patient had a PCN reaction causing immediate rash, facial/tongue/throat swelling, SOB or lightheadedness with hypotension:  unknown Has patient had a PCN reaction causing severe rash involving mucus membranes or skin necrosis: no Has patient had a PCN reaction that required hospitalization: unknown Has patient had a PCN reaction occurring within the last 10 years: no, childhood allergy If all of the above answers are "NO", then may proceed with Cephalosporin use.   . Sulfonamide Derivatives Swelling     Review of Systems:  A fourteen system review of systems was performed and found to be positive as per HPI.   Objective:   Blood pressure 114/70, pulse 72, temperature 98 F (36.7 C), temperature source Oral, resp. rate 16, height 5' (1.524 m), weight 171 lb 3.2 oz (77.7 kg), SpO2 98 %. Body mass index is 33.44 kg/m. General: Nasal cannula in place.  Well Developed, well nourished, appropriate for stated age.  Neuro:  Alert and oriented,  extra-ocular muscles intact  HEENT:  Normocephalic, atraumatic, neck supple, no carotid bruits appreciated  Skin:  no gross rash, warm, pink. Cardiac: Irregularly irregular. Respiratory: bilateral rales and crackles at the bases in the back of posterior lungs, Not using accessory muscles, speaking in full sentences- unlabored. Vascular:  Ext warm, no cyanosis apprec.; cap RF less 2 sec. Psych:  No HI/SI, judgement and insight good, Euthymic mood. Full Affect. Left Arm:  good capillary refill distally, pulses equal b/l, no edema/ ecchymosis etc, localized sorenss to palpation of biceps muscles, skin pink, dry; 4+/5 strength B/L UExt and Equal.

## 2019-10-27 NOTE — Telephone Encounter (Signed)
**Note De-Identified  Obfuscation** 2nd TCM Call attempt: I called the pt today at 2:15 but got no answer so I left a detailed VM stating that I would call her back today at 3 pm before I clock out so she would not have to call back and wait for the operator.   I called back at 3:00 and again got no answer so I left a message asking her to call me back and reminding her that she has a hospital f/u appt scheduled with Tereso Newcomer, PA-c on 3/16 at 11:15 at Dr Namon Cirri office at [address] in [place].  I did leave my name and the office phone number so she can call me back.

## 2019-10-28 ENCOUNTER — Telehealth: Payer: Self-pay | Admitting: Cardiovascular Disease

## 2019-10-28 ENCOUNTER — Other Ambulatory Visit: Payer: Self-pay

## 2019-10-28 LAB — CBC
Hematocrit: 46.6 % (ref 34.0–46.6)
Hemoglobin: 15.5 g/dL (ref 11.1–15.9)
MCH: 30.3 pg (ref 26.6–33.0)
MCHC: 33.3 g/dL (ref 31.5–35.7)
MCV: 91 fL (ref 79–97)
Platelets: 69 10*3/uL — CL (ref 150–450)
RBC: 5.12 x10E6/uL (ref 3.77–5.28)
RDW: 12.7 % (ref 11.7–15.4)
WBC: 7.8 10*3/uL (ref 3.4–10.8)

## 2019-10-28 LAB — MAGNESIUM: Magnesium: 2 mg/dL (ref 1.6–2.3)

## 2019-10-28 LAB — BASIC METABOLIC PANEL
BUN/Creatinine Ratio: 14 (ref 12–28)
BUN: 15 mg/dL (ref 8–27)
CO2: 23 mmol/L (ref 20–29)
Calcium: 8.9 mg/dL (ref 8.7–10.3)
Chloride: 96 mmol/L (ref 96–106)
Creatinine, Ser: 1.05 mg/dL — ABNORMAL HIGH (ref 0.57–1.00)
GFR calc Af Amer: 57 mL/min/{1.73_m2} — ABNORMAL LOW (ref >59)
GFR calc non Af Amer: 49 mL/min/{1.73_m2} — ABNORMAL LOW (ref >59)
Glucose: 235 mg/dL — ABNORMAL HIGH (ref 65–99)
Potassium: 4.1 mmol/L (ref 3.5–5.2)
Sodium: 136 mmol/L (ref 134–144)

## 2019-10-28 NOTE — Telephone Encounter (Signed)
[name] is calling to inform Dr. Acie Fredrickson a fax is being sent over in regards to the two medications prescribed by Dr. Therisa Doyne that needed to be added to the patient's medical records. She states she will need a callback to verify this has been completed. Please advise.

## 2019-10-28 NOTE — Patient Outreach (Signed)
  [facility]) Care Management Chronic Special Needs Program   10/28/2019  Name: [name], DOB: [birth year]  MRN: [id number]  The client was discussed in today's interdisciplinary care team meeting.  The following issues were discussed:  [name] needs, Changes in health status, Key risk triggers/risk stratification, Care Plan, Coordination of care, Care transitions and Issues/barriers to care  Participants present:  Bary Castilla, RN, BSN, MS, CCM  Quinn Plowman, BSN, CCM   [employer], BSN, CCM, CDE  Thea Silversmith, BSN, MSN, CCM  Jacqlyn Larsen, BSN, CCM   [facility], CBCC/ CMAA   Dr. Maryella Shivers   Roney Mans, [place], [place] (HTA)  Karrie Meres, Pharm D (HTA)  Babs Bertin, RN ([place])   Teresa Pelton, RN ([place])   Linus Orn, Government social research officer, CSNP (HTA)   Recommendations:  No recommendations  Plan: RNCM will continue to follow and reach out to client at next scheduled outreach.   Quinn Plowman RN,BSN,CCM [place] [facility] [phone]

## 2019-10-29 NOTE — Telephone Encounter (Signed)
Due to patient's age, please call her and let her know recent labs are wnl/ unchanged from proior and no need for change in txmnt plan.   F/up as instructed last ov

## 2019-10-29 NOTE — Telephone Encounter (Signed)
I spoke to [name] from [place] and said that she spoke to some one yesterday and the medication list was faxed to the patient's PCP.

## 2019-10-29 NOTE — Telephone Encounter (Signed)
Spoke with patients daughter, [name] and advised labs were normal. AS, CMA

## 2019-11-03 ENCOUNTER — Ambulatory Visit (INDEPENDENT_AMBULATORY_CARE_PROVIDER_SITE_OTHER): Payer: HMO | Admitting: Family Medicine

## 2019-11-03 ENCOUNTER — Encounter: Payer: Self-pay | Admitting: Physician Assistant

## 2019-11-03 ENCOUNTER — Other Ambulatory Visit: Payer: Self-pay

## 2019-11-03 VITALS — BP 110/46 | HR 65 | Ht 60.0 in | Wt 163.4 lb

## 2019-11-03 DIAGNOSIS — K7460 Unspecified cirrhosis of liver: Secondary | ICD-10-CM | POA: Diagnosis not present

## 2019-11-03 DIAGNOSIS — R188 Other ascites: Secondary | ICD-10-CM

## 2019-11-03 DIAGNOSIS — K7469 Other cirrhosis of liver: Secondary | ICD-10-CM | POA: Diagnosis not present

## 2019-11-03 DIAGNOSIS — I5032 Chronic diastolic (congestive) heart failure: Secondary | ICD-10-CM | POA: Diagnosis not present

## 2019-11-03 DIAGNOSIS — I2510 Atherosclerotic heart disease of native coronary artery without angina pectoris: Secondary | ICD-10-CM

## 2019-11-03 DIAGNOSIS — I480 Paroxysmal atrial fibrillation: Secondary | ICD-10-CM | POA: Diagnosis not present

## 2019-11-03 DIAGNOSIS — N1831 Chronic kidney disease, stage 3a: Secondary | ICD-10-CM | POA: Diagnosis not present

## 2019-11-03 MED ORDER — ISOSORBIDE MONONITRATE ER 30 MG PO TB24: 30.0000 mg | ORAL_TABLET | Freq: Every day | ORAL | 1 refills | Status: DC

## 2019-11-03 MED ORDER — NITROGLYCERIN 0.4 MG SL SUBL: SUBLINGUAL_TABLET | SUBLINGUAL | 3 refills | Status: DC

## 2019-11-03 MED ORDER — ISOSORBIDE MONONITRATE ER 30 MG PO TB24: 15.0000 mg | ORAL_TABLET | Freq: Every day | ORAL | 1 refills | Status: DC

## 2019-11-03 NOTE — Addendum Note (Signed)
Addended by: Mady Haagensen on: 11/03/2019 02:36 PM   Modules accepted: Orders

## 2019-11-03 NOTE — Progress Notes (Addendum)
Cardiology Office Note  Date: 11/03/2019   ID: [name], DOB 08-26-1933, MRN [id number]  PCP:  Mellody Dance, DO  Cardiologist:  Mertie Moores, MD Electrophysiologist:  None   Chief Complaint: Hospital follow-up CAD, atrial fibrillation RVR, COPD exacerbation, chronic diastolic heart failure  History of Present Illness: [name] is a 86 y.o. female with a history of CAD status post inferior wall MI, status post stenting of her right coronary artery and later stenting of LAD, COPD, interstitial lung disease, morbid obesity, insulin-dependent diabetes, PAF, diastolic heart failure, morbid obesity, hypothyroidism, OSA not on CPAP, nonalcoholic cirrhosis, history of SVT, GERD.  Previous hospitalization in January 2020 for COPD exacerbation and pneumonia and 10/03/2018 for acute on chronic respiratory failure with massive ascites recurrent paracentesis and was given albumin.  She takes Lasix and Aldactone.  She also has associated anemia and thrombocytopenia.  atrial fibrillation during hospitalization.  She is a poor candidate for anticoagulation due to anemia and thrombocytopenia as well as esophageal varices.  Recent admission on 10/14/2019; (Dc'd 10/23/2019) for presentation of abdominal distention, edema, weight gain, dyspnea, wheezing with admission diagnosis of acute on chronic respiratory failure due to cardiogenic pulmonary edema, diastolic CHF complicated by A. fib RVR.  Cardiology consult; patient was started on Cardizem drip for rate control and IV Lasix for fluid overload.  Metoprolol and digoxin were added for better rate control.  She did not require paracentesis.  She was placed on systemic steroids and bronchodilators for COPD exacerbation with improvement.  She was discharged on home O2.    Her weight is down to 163 pounds.  She lost approximately 18 pounds during and after hospital stay.  She has chronic lower extremity edema.   Past Medical History:  Diagnosis  Date  . Cirrhosis, non-alcoholic ([place])   . COPD (chronic obstructive pulmonary disease) ([place])   . Coronary artery disease    status post inferior wall myocardial infarction  . Diabetes mellitus   . Dyslipidemia   . Hypotension 08/02/2010   recent episodes of orthostatic hypotension  . Hypothyroidism   . Leg edema   . Myocardial infarct, old   . Neuropathy of hand    left hand numbness/neuropathy  . Obesity   . Sleep apnea     Past Surgical History:  Procedure Laterality Date  . ABDOMINAL HYSTERECTOMY    . CARDIAC CATHETERIZATION     The ejection fraction is around 50%  . CHOLECYSTECTOMY    . CORONARY STENT PLACEMENT    . ESOPHAGOGASTRODUODENOSCOPY N/A 12/12/2012   Procedure: ESOPHAGOGASTRODUODENOSCOPY (EGD);  Surgeon: [name], MD;  Location: [name];  Service: Endoscopy;  Laterality: N/A;  . FLEXIBLE SIGMOIDOSCOPY N/A 12/12/2012   Procedure: FLEXIBLE SIGMOIDOSCOPY;  Surgeon: [name], MD;  Location: [facility];  Service: Endoscopy;  Laterality: N/A;  . IR PARACENTESIS  09/29/2018  . IR PARACENTESIS  10/01/2018  . TUBAL LIGATION    . WRIST SURGERY      Current Outpatient Medications  Medication Sig Dispense Refill  . albuterol (VENTOLIN HFA) 108 (90 Base) MCG/ACT inhaler INHALE 2 PUFFS BY MOUTH EVERY 6 HOURS AS NEEDED FOR WHEEZING OR SHORTNESS OF BREATH 8.5 g 1  . Blood Glucose Monitoring Suppl (FREESTYLE LITE) DEVI Use to check blood sugars 2-3 times daily 1 each 0  . brimonidine (ALPHAGAN) 0.2 % ophthalmic solution Place 1 drop into both eyes daily.    . Carboxymeth-Glyc-Polysorb PF (REFRESH OPTIVE MEGA-3) 0.5-1-0.5 % SOLN Place 1 drop into both  eyes 2 (two) times daily.     . Cholecalciferol (VITAMIN D3) 50 MCG (2000 UT) TABS Take 1 tablet by mouth daily.    . Continuous Blood Gluc Receiver (FREESTYLE LIBRE 14 DAY READER) DEVI USE TO MONITOR BLOOD SUGAR 3 TIMES DAILY 1 each 0  . Continuous Blood Gluc Sensor (FREESTYLE LIBRE SENSOR SYSTEM) MISC Use to  monitor blood sugars TID 6 each 1  . digoxin (LANOXIN) 0.125 MG tablet Take 1 tablet (0.125 mg total) by mouth daily. 30 tablet 1  . diltiazem (CARDIZEM CD) 120 MG 24 hr capsule Take 1 capsule (120 mg total) by mouth daily. 90 capsule 1  . escitalopram (LEXAPRO) 20 MG tablet TAKE 1 TABLET (20 MG TOTAL) BY MOUTH DAILY. 90 tablet 2  . furosemide (LASIX) 40 MG tablet Take 1 tablet by mouth in the morning daily.  You may take additional dose in the afternoon for edema/swelling 180 tablet 1  . Insulin Aspart FlexPen 100 UNIT/ML SOPN Inject 15 Units into the skin in the morning and at bedtime.    . Lancets (ONETOUCH DELICA PLUS [id number]) MISC USE TO CHECK BLOOD SUGAR THREE TIMES A DAY 100 each 9  . levocetirizine (XYZAL) 5 MG tablet Take 1 tablet (5 mg total) by mouth every evening. During allergy season only 90 tablet 1  . levothyroxine (SYNTHROID) 100 MCG tablet TAKE 1 TABLET BY MOUTH DAILY 90 tablet 0  . metoprolol tartrate (LOPRESSOR) 50 MG tablet Take 1 tablet (50 mg total) by mouth 2 (two) times daily. 180 tablet 1  . montelukast (SINGULAIR) 10 MG tablet TAKE 1 TABLET BY MOUTH EVERY EVENING. 90 tablet 0  . Multiple Vitamins-Minerals (VISION FORMULA/LUTEIN) TABS Take 1 tablet by mouth daily.    . nitroGLYCERIN (NITROSTAT) 0.4 MG SL tablet Place 1 tab under the tongue every 5 minutes as needed for chest pain. 25 tablet 3  . pantoprazole (PROTONIX) 20 MG tablet TAKE ONE TABLET BY MOUTH TWICE DAILY 180 tablet 0  . Propylene Glycol (SYSTANE BALANCE) 0.6 % SOLN Place 1 drop into both eyes 2 (two) times daily.     . RESTASIS 0.05 % ophthalmic emulsion Place 1 drop into both eyes 2 (two) times daily. 1 drop both eyes twice daily    . rifaximin (XIFAXAN) 550 MG TABS tablet Take 550 mg by mouth 2 (two) times daily.    Marland Kitchen spironolactone (ALDACTONE) 100 MG tablet Take 1 tablet (100 mg total) by mouth daily. 90 tablet 1  . umeclidinium-vilanterol (ANORO ELLIPTA) 62.5-25 MCG/INH AEPB Inhale 1 puff into the lungs  daily. 1 each 1  . UNIFINE PENTIPS 31G X 5 MM MISC USE TO INJECT INSULIN 5 TIMES A DAY 200 each 11  . ursodiol (ACTIGALL) 500 MG tablet Take 500 mg by mouth in the morning and at bedtime.    . White Petrolatum-Mineral Oil (GENTEAL TEARS NIGHT-TIME) OINT Place 1 application into both eyes at bedtime.      No current facility-administered medications for this visit.   Allergies:  Ace inhibitors, Benadryl [diphenhydramine hcl (sleep)], Fexofenadine hcl, Penicillins, and Sulfonamide derivatives   Social History: The patient  reports that she quit smoking about 46 years ago. She has never used smokeless tobacco. She reports that she does not drink alcohol or use drugs.   Family History: The patient's family history includes Alcohol abuse in her maternal uncle and son; Cancer in her son; Diabetes in her brother, daughter, daughter, father, mother, sister, son, son, and son; Emphysema in her brother; Heart  attack in her daughter, father, and paternal grandfather; Heart disease in her father; Hyperlipidemia in her brother, father, and sister; Hypertension in her brother, father, and sister; Suicidality in her son.   ROS:  Please see the history of present illness. Otherwise, complete review of systems is positive for none.  All other systems are reviewed and negative.  ROS  Physical Exam: VS:  BP (!) 110/46   Pulse 65   Ht 5' (1.524 m)   Wt 163 lb 6.4 oz (74.1 kg)   SpO2 97%   BMI 31.91 kg/m , BMI Body mass index is 31.91 kg/m.  Wt Readings from Last 3 Encounters:  11/03/19 163 lb 6.4 oz (74.1 kg)  10/27/19 171 lb 3.2 oz (77.7 kg)  10/20/19 181 lb (82.1 kg)    General: Patient appears comfortable at rest. Lungs: Clear to auscultation, nonlabored breathing at rest. Cardiac: Regular rate and rhythm, no S3 or significant systolic murmur, no pericardial rub. Extremities: No pitting edema, distal pulses 2+. Skin: Warm and dry. Musculoskeletal: No kyphosis. Neuropsychiatric: Alert and oriented  x3, affect grossly appropriate.  ECG:  An ECG dated 11/03/2019 was personally reviewed today and demonstrated:  atrial fibrillation rate of 65   Recent Labwork: 10/14/2019: B Natriuretic Peptide 243.2 10/15/2019: ALT 20; AST 29; TSH 1.662 10/27/2019: BUN 15; Creatinine, Ser 1.05; Hemoglobin 15.5; Magnesium 2.0; Platelets 69; Potassium 4.1; Sodium 136     Component Value Date/Time   CHOL 270 (H) 06/19/2019 0906   TRIG 169 (H) 06/19/2019 0906   HDL 17 (L) 06/19/2019 0906   CHOLHDL 15.9 (H) 06/19/2019 0906   CHOLHDL 2.5 05/02/2016 0758   VLDL 25 05/02/2016 0758   LDLCALC 220 (H) 06/19/2019 0906    Other Studies Reviewed Today: Echocardiogram 10/15/2019  1. Left ventricular ejection fraction, by estimation, is 55 to 60%. The left ventricle has normal function. The left ventricle has no regional wall motion abnormalities. Unable to assess LV diastolic filling due ot underlying atrial fibrillation. 2. Right ventricular systolic function is normal. The right ventricular size is mildly enlarged. 3. The mitral valve is degenerative. There is mild thickening of the mitral valve leaflet(s). There is mild calcification of the mitral valve leaflet(s). Normal mobility of the mitral valve leaflets. Moderate mitral annular calcification. Mild mitral valve regurgitation. Mild mitral valve regurgitation. No evidence of mitral stenosis. 4. The aortic valve is normal in structure and function. Aortic valve regurgitation is not visualized. No aortic stenosis is present.  Assessment and Plan: No thanks she can she can do is she cannot take any statin medications  1. Paroxysmal atrial fibrillation (HCC)   2. Chronic diastolic heart failure ([place])   3. Cirrhosis of liver with ascites, unspecified hepatic cirrhosis type ([place])   4. Coronary artery disease involving native coronary artery of native heart without angina pectoris   5. Stage 3a chronic kidney disease    1. Paroxysmal atrial fibrillation  ([place]) Recent hospital admission EKG showed atrial fibrillation with a rate of 138 RVR on 10/15/2019  EKG  today shows atrial; fibrillation rate of 65 (i.e rate is controlled) on current therapy. She denies any palpitations or fluttering sensation .  Continue metoprolol 50 mg p.o. twice daily.  Continue and digoxin 0.125.  Get a digoxin level.  2. Chronic diastolic heart failure ([place]) Recent hospitalization with presentation of increased weight gain, abdominal ascites, wheezing. shortness of breath.  She received IV Lasix for volume overload and lost approximately 10 pounds prior to discharge.  She has lost  approximately 18 pounds since discharge from the hospital.  Her symptoms are currently NYHA class II.  Recent EF on echocardiogram 55 to 60%  3. Cirrhosis of liver with ascites, unspecified hepatic cirrhosis type ([place]) History of NASH with portal hypertension, chronic ascites with history of paracentesis. Portal HTN with esophageal varices. Associated thrombocytopenia.  No evidence of ascites, lower extremity edema, Rales, wheezing.  She has lost a significant amount of weight  since being discharged from the hospital.  Not on statin medication due to liver cirrhosis.  Last calculated LDL was 220.  4. Coronary artery disease involving native coronary artery of native heart without angina pectoris History of inferior wall MI with stenting to RCA and  LAD.  Patient complaining of some mild anginal chest pain when performing ADLs at home and walking in her house.  She does not get outside perform any significant activities.  Start Imdur 15 mg p.o. daily due to grade 2 CCS symptoms.  Refill nitroglycerin 0.4 mg sublingual as needed for chest pain.  Due to thrombocytopenia and multiple comorbidities she is not a candidate for anticoagulation at this point and stress test would not add much to her plan and risks of cardiac intervention are increased   5.CKD stage III  Recent creatinine 1.05 and GFR 49.     6.  Thrombocytopenia Recent platelets were 69 on October 27, 2019 otherwise CBC was normal.    Medication Adjustments/Labs and Tests Ordered: Current medicines are reviewed at length with the patient today.  Concerns regarding medicines are outlined above.   Disposition: Follow-up with Dr. Acie Fredrickson or APP  Signed, Levell July, NP 11/03/2019 11:49 AM    Reader

## 2019-11-03 NOTE — Patient Instructions (Addendum)
Medication Instructions:   Your physician has recommended you make the following change in your medication:   1) Start Imdur 15 mg, 0.5 tablet by mouth once a day  *If you need a refill on your cardiac medications before your next appointment, please call your pharmacy*  Lab Work:  None ordered today  Testing/Procedures:  None ordered today  Follow-Up: At [facility], you and your health needs are our priority.  As part of our continuing mission to provide you with exceptional heart care, we have created designated Provider Care Teams.  These Care Teams include your primary Cardiologist (physician) and Advanced Practice Providers (APPs -  Physician Assistants and Nurse Practitioners) who all work together to provide you with the care you need, when you need it.  We recommend signing up for the patient portal called "MyChart".  Sign up information is provided on this After Visit Summary.  MyChart is used to connect with patients for Virtual Visits (Telemedicine).  Patients are able to view lab/test results, encounter notes, upcoming appointments, etc.  Non-urgent messages can be sent to your provider as well.   To learn more about what you can do with MyChart, go to [email or web address].    Your next appointment:    On 11/20/19 at 9:15AM with Richardson Dopp, PA-C. Do not take your Digoxin on the morning of this appointment, we will check your lab work at this visit.

## 2019-11-04 DIAGNOSIS — K766 Portal hypertension: Secondary | ICD-10-CM | POA: Diagnosis not present

## 2019-11-04 DIAGNOSIS — I2510 Atherosclerotic heart disease of native coronary artery without angina pectoris: Secondary | ICD-10-CM | POA: Diagnosis not present

## 2019-11-04 DIAGNOSIS — I4819 Other persistent atrial fibrillation: Secondary | ICD-10-CM | POA: Diagnosis not present

## 2019-11-04 DIAGNOSIS — I8500 Esophageal varices without bleeding: Secondary | ICD-10-CM | POA: Diagnosis not present

## 2019-11-04 DIAGNOSIS — I493 Ventricular premature depolarization: Secondary | ICD-10-CM | POA: Diagnosis not present

## 2019-11-04 DIAGNOSIS — G930 Cerebral cysts: Secondary | ICD-10-CM | POA: Diagnosis not present

## 2019-11-04 DIAGNOSIS — J441 Chronic obstructive pulmonary disease with (acute) exacerbation: Secondary | ICD-10-CM | POA: Diagnosis not present

## 2019-11-04 DIAGNOSIS — H5461 Unqualified visual loss, right eye, normal vision left eye: Secondary | ICD-10-CM | POA: Diagnosis not present

## 2019-11-04 DIAGNOSIS — E1140 Type 2 diabetes mellitus with diabetic neuropathy, unspecified: Secondary | ICD-10-CM | POA: Diagnosis not present

## 2019-11-04 DIAGNOSIS — R188 Other ascites: Secondary | ICD-10-CM | POA: Diagnosis not present

## 2019-11-04 DIAGNOSIS — R161 Splenomegaly, not elsewhere classified: Secondary | ICD-10-CM | POA: Diagnosis not present

## 2019-11-04 DIAGNOSIS — D72819 Decreased white blood cell count, unspecified: Secondary | ICD-10-CM | POA: Diagnosis not present

## 2019-11-04 DIAGNOSIS — K7460 Unspecified cirrhosis of liver: Secondary | ICD-10-CM | POA: Diagnosis not present

## 2019-11-04 DIAGNOSIS — I252 Old myocardial infarction: Secondary | ICD-10-CM | POA: Diagnosis not present

## 2019-11-04 DIAGNOSIS — I951 Orthostatic hypotension: Secondary | ICD-10-CM | POA: Diagnosis not present

## 2019-11-04 DIAGNOSIS — J849 Interstitial pulmonary disease, unspecified: Secondary | ICD-10-CM | POA: Diagnosis not present

## 2019-11-04 DIAGNOSIS — J9621 Acute and chronic respiratory failure with hypoxia: Secondary | ICD-10-CM | POA: Diagnosis not present

## 2019-11-04 DIAGNOSIS — K7469 Other cirrhosis of liver: Secondary | ICD-10-CM | POA: Diagnosis not present

## 2019-11-04 DIAGNOSIS — D61818 Other pancytopenia: Secondary | ICD-10-CM | POA: Diagnosis not present

## 2019-11-04 DIAGNOSIS — I110 Hypertensive heart disease with heart failure: Secondary | ICD-10-CM | POA: Diagnosis not present

## 2019-11-04 DIAGNOSIS — I088 Other rheumatic multiple valve diseases: Secondary | ICD-10-CM | POA: Diagnosis not present

## 2019-11-04 DIAGNOSIS — D696 Thrombocytopenia, unspecified: Secondary | ICD-10-CM | POA: Diagnosis not present

## 2019-11-04 DIAGNOSIS — K7581 Nonalcoholic steatohepatitis (NASH): Secondary | ICD-10-CM | POA: Diagnosis not present

## 2019-11-04 DIAGNOSIS — I0981 Rheumatic heart failure: Secondary | ICD-10-CM | POA: Diagnosis not present

## 2019-11-04 DIAGNOSIS — I5043 Acute on chronic combined systolic (congestive) and diastolic (congestive) heart failure: Secondary | ICD-10-CM | POA: Diagnosis not present

## 2019-11-04 DIAGNOSIS — I700 Atherosclerosis of aorta: Secondary | ICD-10-CM | POA: Diagnosis not present

## 2019-11-09 ENCOUNTER — Other Ambulatory Visit: Payer: Self-pay

## 2019-11-09 NOTE — Patient Outreach (Signed)
  [facility]) Care Management Chronic Special Needs Program    11/09/2019  Name: [name], DOB: 06-23-1934  MRN: [id number]   [name] is enrolled in a chronic special needs plan for Diabetes. Telephone call to client for health risk assessment completion and assessment follow up.  Unable to reach. HIPAA compliant voice message left with call back phone number and return call request.   PLAN; RNCM will attempt 2nd  telephone call to client within 1 week.   Quinn Plowman RN,BSN,CCM [facility] [phone]

## 2019-11-12 ENCOUNTER — Other Ambulatory Visit: Payer: Self-pay | Admitting: Cardiovascular Disease

## 2019-11-12 ENCOUNTER — Ambulatory Visit: Payer: Self-pay

## 2019-11-12 ENCOUNTER — Other Ambulatory Visit: Payer: Self-pay | Admitting: Cardiology

## 2019-11-12 ENCOUNTER — Other Ambulatory Visit: Payer: Self-pay | Admitting: Family Medicine

## 2019-11-12 DIAGNOSIS — E039 Hypothyroidism, unspecified: Secondary | ICD-10-CM

## 2019-11-12 MED ORDER — FUROSEMIDE 40 MG PO TABS: ORAL_TABLET | ORAL | 3 refills | Status: AC

## 2019-11-16 MED ORDER — METOPROLOL TARTRATE 50 MG PO TABS: 50.0000 mg | ORAL_TABLET | Freq: Two times a day (BID) | ORAL | 3 refills | Status: DC

## 2019-11-16 MED ORDER — ISOSORBIDE MONONITRATE ER 30 MG PO TB24: 15.0000 mg | ORAL_TABLET | Freq: Every day | ORAL | 3 refills | Status: DC

## 2019-11-16 MED ORDER — SPIRONOLACTONE 100 MG PO TABS: 100.0000 mg | ORAL_TABLET | Freq: Every day | ORAL | 3 refills | Status: DC

## 2019-11-16 MED ORDER — DIGOXIN 125 MCG PO TABS: 0.1250 mg | ORAL_TABLET | Freq: Every day | ORAL | 3 refills | Status: DC

## 2019-11-17 ENCOUNTER — Other Ambulatory Visit: Payer: Self-pay

## 2019-11-17 ENCOUNTER — Other Ambulatory Visit: Payer: Self-pay | Admitting: Family Medicine

## 2019-11-17 NOTE — Patient Outreach (Signed)
[facility]) Care Management Chronic Special Needs Program  11/17/2019  Name: [name] DOB: [birth year]  MRN: [id number]  [name] is enrolled in a chronic special needs plan for Diabetes. Reviewed and updated care plan.  Subjective: Telephone call to client's daughter/ designated party release, [name]. Daughter states client has been doing well since discharge from the hospital.  She reports client is tolerating her new medications well post hospital discharge and her Atrial fibrillation has been stable. She states clients heart rate is ranging from the 60's to 80's.   Daughter states clients blood pressure ranges on the low end. She reports client has a talking blood pressure monitor. Daughter states client has a follow up with her cardiologist on 11/20/19.  She states client saw her gastroenterologist last on 11/04/19 and will follow up again in 6 months. Daughter confirms [facility] continues to see client. She reports clients home health services with [name] have ended. Daughter states client had a telephone appointment with her primary MD on 11/17/19 and next follow up with be in May 2021.  Daughter states she is pleased that client's A1c has come down considerably to  7.9 from 10.6.  She states she contributes this to setting up client with  Dietary restrictions with meals on wheels . Daughter states she has completed the Advanced directive packet with client but she has not had the documents notarized. She states she attempted to have the documents notorized at a bank but they would not do it. She states she is not sure where to have it done. RNCM offered to refer client/ daughter to Child psychotherapist for assistance. Daughter verbally agreed.      Goals Addressed            This Visit's Progress   . COMPLETED:  Acknowledge receipt of Advanced Directive package       Daughter acknowledges receipt of Advanced directive packet.     . "write a new advance directive"  (pt-stated)   On track    RN case manager referred client / daughter to social worker to assist with completion of Advanced directive.     . Advanced Care Planning complete by 2021   On track    Daughter report Advanced directive has been filled out but not notarized. RN case manager will refer [facility] care management social worker to client/daughter to assist/ advise on completing Advanced directive.  Client / daughter will have Advanced directive notarized.    . Client understands the importance of follow-up with providers by attending scheduled visits   On track    Primary care provider telephone appointment 11/17/19 Continue to maintain and keep follow up visits with your providers.      . Client verbalize knowledge of Heart Failure disease self management skills within 6 months   On track    Heart failure signs/ symptoms reviewed Contact your doctor for mild to moderate symptoms.  Call 911 for severe symptoms Continue to Weigh and record weights daily Continue to Adhere to low salt and fluid restrictions as recommended.      . COMPLETED: Client will report abillity to obtain Medications within next 4-5 months       Daughter reports client has obtained her medications and takes them as prescribed.     . Client will report no worsening of symptoms of Atrial Fibrillation within the next 4-5 months   Not on track    Client admitted to the hospital 10/14/19 with atrial fibrillation.     Marland Kitchen  Client will report no worsening of symptoms related to heart disease within the next 4-5 months       RN case manager will send client education article: Heart disease in diabetics Take you rmedication     . Client will verbalize knowledge of chronic lung disease as evidenced by no ED visits or Inpatient stays related to chronic lung disease    Not on track    Client admitted to the hospital on 10/14/19 with COPD exacerbation.    . Client will verbalize knowledge of self management of Hypertension as  evidences by BP reading of 140/90 or less; or as defined by provider   On track    RN case manager will send client education article: High blood pressure: What you can do. Assessed high blood pressure self management skills, Ensured client has home blood pressure monitor Take medications as prescribed.  Follow up with your provider as recommended  Monitor your blood pressure and take results to your doctor appointment Ask your doctor, "What is my blood pressure range."     . Decrease inpatient diabetes admissions/readmissions with in the year       Discussed diabetes self management actions:  Glucose monitoring per provider recommendation  Visit provider every 3-6 months as directed  Hbg A1C level every 3-6 months.  Carbohydrate controlled meal planning  Taking diabetes medication as prescribed by provider  Physical activity     . General - Client will not be readmitted within 30 days (C-SNP)   On track    Client discharged on 10/23/19 Attend all follow up appointments as scheduled. Take your medications as prescribed. Call 24 Hour nurse advice line as needed ([phone]).  Contact your doctor if you have questions.    Marland Kitchen HEMOGLOBIN A1C < 7.0       Daughter states client obtained Free style [place]. Discussed diabetes self management actions:  Glucose monitoring per provider recommendation  Visit provider every 3-6 months as directed  Hbg A1C level every 3-6 months.  Carbohydrate controlled meal planning  Taking diabetes medication as prescribed by provider  Physical activity     . Maintain timely refills of diabetic medication as prescribed within the year .       Review of clients medical record indicates client maintains timely refills of diabetic medications.  Continue to take your medications as prescribed.  Follow up with your doctor if you have questions  Contact your assigned RN case manager [phone] if you have difficulty obtaining your medications.       . COMPLETED: Obtain annual  Lipid Profile, LDL-C       Lipid profile completed on 06/19/19 The goal for LDL is less than 70 mg/ dl as you are at high risk for complications try to avoid saturated fats, trans-fats, and eat more fiber. RN case manager will send client education article: Heart healthy diet.     . COMPLETED: Obtain Annual Eye (retinal)  Exam        Diabetic eye exam completed 03/04/19 Plan to schedule your eye exam yearly    . Obtain Annual Foot Exam   On track    Annual foot exam 03/12/18.  Unable to find documented foot exam in medical record for 2020. Diabetes foot care - Check feet daily at home (look for skin color changes, cuts, sores or cracks in the skin, swelling of feet or ankles, ingrown or fungal toenails, corn or calluses). Report these findings to your doctor - Wash feet with soap and  water, dry feet well especially between toes - Moisturize your feet but not between the toes - Always wear shoes that protect your whole feet.      . Visit Primary Care Provider or Endocrinologist at least 2 times per year        Primary care provider visit 11/17/19, 10/27/19      Assessment: Clients A1c has improved from 10.6 to recent reading of 7.9. Client actively involved with [facility] for community care services. Next scheduled appointment 12/03/19. Client has good understanding of:  COVID-19 cause, symptoms, precautions (social distancing, stay at home order, hand washing, wear face covering when unable to maintain or ensure 6 foot social distancing), and symptoms requiring provider notification.  Plan:  Send successful outreach letter with updated individualized care plan, send update individualized care plan to provider and send education articles.   Chronic care management coordinator will outreach in:  3 Months  Will refer to:  Social Work to assist with Advanced directive completion.  RNCM will continue care coordination/ collaboration with [facility] for client.     Quinn Plowman RN,BSN,CCM Chronic Care Management Coordinator [facility] [phone]    .

## 2019-11-17 NOTE — Telephone Encounter (Signed)
This encounter was created in error - please disregard.

## 2019-11-17 NOTE — Patient Outreach (Signed)
  [facility]) Care Management Chronic Special Needs Program    11/17/2019  Name: [name], DOB: [birth year]  MRN: [id number]   [name] is enrolled in a chronic special needs plan for Diabetes. Second telephone attempt to client for assessment follow up. Unable to reach or leave voice message.  Phone only rang.  Call attempted to client's daughter/ designated party release, [name]. Unable to reach. HIPAA compliant voice message left with call back phone number and return call request.   PLAN; RNCM will attempt 3rd telephone outreach to client within 1 week.   Quinn Plowman RN,BSN,CCM [facility] [phone]

## 2019-11-18 NOTE — Telephone Encounter (Signed)
See MyChart messages from the pt's daughter and authorize refill if appropriate.  Charyl Bigger, CMA

## 2019-11-18 NOTE — Telephone Encounter (Signed)
LVM for pt's daughter to call to discuss correct dosage.  Charyl Bigger, CMA

## 2019-11-18 NOTE — Addendum Note (Signed)
Addended by: Quinn Plowman E on: 11/18/2019 12:21 PM   Modules accepted: Orders

## 2019-11-18 NOTE — Telephone Encounter (Signed)
MyChart message sent to confirm dosage.  Charyl Bigger, CMA

## 2019-11-18 NOTE — Telephone Encounter (Signed)
This encounter was created in error - please disregard.

## 2019-11-19 ENCOUNTER — Other Ambulatory Visit: Payer: Self-pay | Admitting: Family Medicine

## 2019-11-19 DIAGNOSIS — D696 Thrombocytopenia, unspecified: Secondary | ICD-10-CM | POA: Diagnosis not present

## 2019-11-19 DIAGNOSIS — I0981 Rheumatic heart failure: Secondary | ICD-10-CM | POA: Diagnosis not present

## 2019-11-19 DIAGNOSIS — I8500 Esophageal varices without bleeding: Secondary | ICD-10-CM | POA: Diagnosis not present

## 2019-11-19 DIAGNOSIS — J9621 Acute and chronic respiratory failure with hypoxia: Secondary | ICD-10-CM | POA: Diagnosis not present

## 2019-11-19 DIAGNOSIS — D61818 Other pancytopenia: Secondary | ICD-10-CM | POA: Diagnosis not present

## 2019-11-19 DIAGNOSIS — K7581 Nonalcoholic steatohepatitis (NASH): Secondary | ICD-10-CM | POA: Diagnosis not present

## 2019-11-19 DIAGNOSIS — I700 Atherosclerosis of aorta: Secondary | ICD-10-CM | POA: Diagnosis not present

## 2019-11-19 DIAGNOSIS — K766 Portal hypertension: Secondary | ICD-10-CM | POA: Diagnosis not present

## 2019-11-19 DIAGNOSIS — J441 Chronic obstructive pulmonary disease with (acute) exacerbation: Secondary | ICD-10-CM | POA: Diagnosis not present

## 2019-11-19 DIAGNOSIS — G930 Cerebral cysts: Secondary | ICD-10-CM | POA: Diagnosis not present

## 2019-11-19 DIAGNOSIS — R161 Splenomegaly, not elsewhere classified: Secondary | ICD-10-CM | POA: Diagnosis not present

## 2019-11-19 DIAGNOSIS — I088 Other rheumatic multiple valve diseases: Secondary | ICD-10-CM | POA: Diagnosis not present

## 2019-11-19 DIAGNOSIS — H5461 Unqualified visual loss, right eye, normal vision left eye: Secondary | ICD-10-CM | POA: Diagnosis not present

## 2019-11-19 DIAGNOSIS — I4819 Other persistent atrial fibrillation: Secondary | ICD-10-CM | POA: Diagnosis not present

## 2019-11-19 DIAGNOSIS — D72819 Decreased white blood cell count, unspecified: Secondary | ICD-10-CM | POA: Diagnosis not present

## 2019-11-19 DIAGNOSIS — I252 Old myocardial infarction: Secondary | ICD-10-CM | POA: Diagnosis not present

## 2019-11-19 DIAGNOSIS — E1140 Type 2 diabetes mellitus with diabetic neuropathy, unspecified: Secondary | ICD-10-CM | POA: Diagnosis not present

## 2019-11-19 DIAGNOSIS — I5043 Acute on chronic combined systolic (congestive) and diastolic (congestive) heart failure: Secondary | ICD-10-CM | POA: Diagnosis not present

## 2019-11-19 DIAGNOSIS — R188 Other ascites: Secondary | ICD-10-CM | POA: Diagnosis not present

## 2019-11-19 DIAGNOSIS — I493 Ventricular premature depolarization: Secondary | ICD-10-CM | POA: Diagnosis not present

## 2019-11-19 DIAGNOSIS — I2510 Atherosclerotic heart disease of native coronary artery without angina pectoris: Secondary | ICD-10-CM | POA: Diagnosis not present

## 2019-11-19 DIAGNOSIS — I951 Orthostatic hypotension: Secondary | ICD-10-CM | POA: Diagnosis not present

## 2019-11-19 DIAGNOSIS — K7460 Unspecified cirrhosis of liver: Secondary | ICD-10-CM | POA: Diagnosis not present

## 2019-11-19 DIAGNOSIS — J849 Interstitial pulmonary disease, unspecified: Secondary | ICD-10-CM | POA: Diagnosis not present

## 2019-11-19 DIAGNOSIS — I110 Hypertensive heart disease with heart failure: Secondary | ICD-10-CM | POA: Diagnosis not present

## 2019-11-19 NOTE — Telephone Encounter (Signed)
See chart, she has endo doc who manages her DM,   we do not rf this med

## 2019-11-19 NOTE — Progress Notes (Signed)
Cardiology Office Note:    Date:  11/20/2019   ID:  [name], DOB May 19, 1934, MRN [id number]  PCP:  Mellody Dance, DO  Cardiologist:  Mertie Moores, MD  Electrophysiologist:  None   Referring MD: Mellody Dance, DO   Chief Complaint:  Follow-up (CAD, CHF, AFib)    Patient Profile:    [name] is a 86 y.o. female with:   Coronary artery disease   S/p inferior MI >> PCI: stent to RCA  S/p PCI with stent to the LAD  COPD, home O2  Interstitial Lung Dz  Chronic combined systolic and diastolic CHF  Echocardiogram 09/2019: EF 55-60  Echocardiogram 09/2018: EF 40-45, Gr 2 DD  Echocardiogram 11/2012: EF 50-55  Morbid obesity   Diabetes mellitus 2, Insulin Dependent   Paroxysmal atrial fibrillation   Not a candidate for anticoagulation   Non-alcoholic cirrhosis   admx 09/2018 w/ respiratory failure; underwent paracentesis due to ascites   Hx of SVT   Prior CV studies: Echocardiogram 10/15/2019 EF 55-60, no RWMA, normal RVSF, mild RVE, mod MAC, mild MR  Echocardiogram 09/27/2018 EF 40-45, Gr 2 DD, inf AK  Carotid US 09/10/17 Bilat ICA 1-39  Echocardiogram 12/12/12 EF 50-55  Myoview 06/20/11 Inf infarct, small ant-lat mild ischemia  Cardiac catheterization 05/10/2009 LM 10-20 LAD prox 30, mid 30-40; D1 prox 30 LCx 10-20 RI 100 RCA prox stent patent EF 50  History of Present Illness:    [name] was admitted 2/24-3/5 with acute on chronic hypoxic respiratory failure due to decompensated heart failure in the setting of atrial fibrillation with RVR.  She was started on Diltiazem, beta-blocker and Digoxin for rate control.  An echocardiogram demonstrated improved LVF with EF 55-65.  Of note, she is not a candidate for anticoagulation or Amiodarone due to her hx of cirrhosis.  She was last seen in follow up 11/03/2019 by Katina Dung, NP.  Her volume status was stable.  But, she did complain of some exertional chest pain.  She was previously on  Isosorbide.  This had been DC'd in the hospital.  Isosorbide 15 mg once daily was started.  She returns for follow up.    She returns for follow-up.  She is here with her daughter.  Since last seen, she has done well.  She has not had further chest discomfort.  Her breathing is overall stable.  She sleeps on one pillow.  She has not had lower extremity swelling.  She has not had syncope.  She does have some dizziness with abrupt standing.  This is a chronic symptom without change.    Past Medical History:  Diagnosis Date  . Cirrhosis, non-alcoholic ([place])   . COPD (chronic obstructive pulmonary disease) ([place])   . Coronary artery disease    status post inferior wall myocardial infarction  . Diabetes mellitus   . Dyslipidemia   . Hypotension 08/02/2010   recent episodes of orthostatic hypotension  . Hypothyroidism   . Leg edema   . Myocardial infarct, old   . Neuropathy of hand    left hand numbness/neuropathy  . Obesity   . Sleep apnea     Current Medications: Current Meds  Medication Sig  . albuterol (VENTOLIN HFA) 108 (90 Base) MCG/ACT inhaler INHALE 2 PUFFS BY MOUTH EVERY 6 HOURS AS NEEDED FOR WHEEZING OR SHORTNESS OF BREATH  . Blood Glucose Monitoring Suppl (FREESTYLE LITE) DEVI Use to check blood sugars 2-3 times daily  . brimonidine (ALPHAGAN) 0.2 % ophthalmic solution Place  1 drop into both eyes daily.  . Carboxymeth-Glyc-Polysorb PF (REFRESH OPTIVE MEGA-3) 0.5-1-0.5 % SOLN Place 1 drop into both eyes 2 (two) times daily.   . Cholecalciferol (VITAMIN D3) 50 MCG (2000 UT) TABS Take by mouth daily.   . Continuous Blood Gluc Receiver (FREESTYLE LIBRE 14 DAY READER) DEVI USE TO MONITOR BLOOD SUGAR 3 TIMES DAILY  . Continuous Blood Gluc Sensor (FREESTYLE LIBRE SENSOR SYSTEM) MISC Use to monitor blood sugars TID  . digoxin (LANOXIN) 0.125 MG tablet Take 1 tablet (0.125 mg total) by mouth daily.  Marland Kitchen diltiazem (CARDIZEM CD) 120 MG 24 hr capsule Take 1 capsule (120 mg total) by mouth  daily.  Marland Kitchen escitalopram (LEXAPRO) 20 MG tablet TAKE 1 TABLET (20 MG TOTAL) BY MOUTH DAILY.  . furosemide (LASIX) 40 MG tablet Take 1 tablet by mouth in the morning daily.  You may take additional dose in the afternoon for edema/swelling  . Insulin Aspart FlexPen 100 UNIT/ML SOPN INJECT 20 UNITS SUBCUTANEOUSLY THREE TIMES A DAY BEFORE MEALS; ADJUST AND INCREASE ACCORDINGLY.  Marland Kitchen isosorbide mononitrate (IMDUR) 30 MG 24 hr tablet Take 0.5 tablets (15 mg total) by mouth daily.  . Lancets (ONETOUCH DELICA PLUS [id number]) MISC USE TO CHECK BLOOD SUGAR THREE TIMES A DAY  . levocetirizine (XYZAL) 5 MG tablet Take 1 tablet (5 mg total) by mouth every evening. During allergy season only  . levothyroxine (SYNTHROID) 100 MCG tablet TAKE 1 TABLET BY MOUTH DAILY  . metoprolol tartrate (LOPRESSOR) 50 MG tablet Take 1 tablet (50 mg total) by mouth 2 (two) times daily.  . montelukast (SINGULAIR) 10 MG tablet TAKE 1 TABLET BY MOUTH EVERY EVENING.  . Multiple Vitamins-Minerals (VISION FORMULA/LUTEIN) TABS Take 1 tablet by mouth daily.  . nitroGLYCERIN (NITROSTAT) 0.4 MG SL tablet PLACE 1 TABLET UNDER THE TONGUE EVERY 5 MINUTES AS NEEDED FOR CHEST PAIN  . pantoprazole (PROTONIX) 20 MG tablet TAKE ONE TABLET BY MOUTH TWICE DAILY  . Propylene Glycol (SYSTANE BALANCE) 0.6 % SOLN Place 1 drop into both eyes 2 (two) times daily.   . RESTASIS 0.05 % ophthalmic emulsion Place 1 drop into both eyes 2 (two) times daily. 1 drop both eyes twice daily  . rifaximin (XIFAXAN) 550 MG TABS tablet Take 550 mg by mouth 2 (two) times daily.  Marland Kitchen spironolactone (ALDACTONE) 100 MG tablet Take 1 tablet (100 mg total) by mouth daily.  Marland Kitchen umeclidinium-vilanterol (ANORO ELLIPTA) 62.5-25 MCG/INH AEPB Inhale 1 puff into the lungs daily.  Marland Kitchen UNIFINE PENTIPS 31G X 5 MM MISC USE TO INJECT INSULIN 5 TIMES A DAY  . ursodiol (ACTIGALL) 500 MG tablet Take 500 mg by mouth in the morning and at bedtime.  . White Petrolatum-Mineral Oil (GENTEAL TEARS  NIGHT-TIME) OINT Place 1 application into both eyes at bedtime.      Allergies:   Ace inhibitors, Benadryl [diphenhydramine hcl (sleep)], Fexofenadine hcl, Penicillins, and Sulfonamide derivatives   Social History   Tobacco Use  . Smoking status: Former Smoker    Quit date: 08/20/1973    Years since quitting: 46.2  . Smokeless tobacco: Never Used  Substance Use Topics  . Alcohol use: No  . Drug use: No     Family Hx: The patient's family history includes Alcohol abuse in her maternal uncle and son; Cancer in her son; Diabetes in her brother, daughter, daughter, father, mother, sister, son, son, and son; Emphysema in her brother; Heart attack in her daughter, father, and paternal grandfather; Heart disease in her father; Hyperlipidemia in  her brother, father, and sister; Hypertension in her brother, father, and sister; Suicidality in her son.  ROS   EKGs/Labs/Other Test Reviewed:    EKG:  EKG is not ordered today.  The ekg ordered today demonstrates n/a  Recent Labs: 10/14/2019: B Natriuretic Peptide 243.2 10/15/2019: ALT 20; TSH 1.662 10/27/2019: BUN 15; Creatinine, Ser 1.05; Hemoglobin 15.5; Magnesium 2.0; Platelets 69; Potassium 4.1; Sodium 136   Recent Lipid Panel Lab Results  Component Value Date/Time   CHOL 270 (H) 06/19/2019 09:06 AM   TRIG 169 (H) 06/19/2019 09:06 AM   HDL 17 (L) 06/19/2019 09:06 AM   CHOLHDL 15.9 (H) 06/19/2019 09:06 AM   CHOLHDL 2.5 05/02/2016 07:58 AM   LDLCALC 220 (H) 06/19/2019 09:06 AM    Physical Exam:    VS:  BP (!) 116/58   Pulse 75   Ht 5' (1.524 m)   Wt 161 lb (73 kg)   SpO2 97%   BMI 31.44 kg/m     Wt Readings from Last 3 Encounters:  11/20/19 161 lb (73 kg)  11/03/19 163 lb 6.4 oz (74.1 kg)  10/27/19 171 lb 3.2 oz (77.7 kg)     Constitutional:      Appearance: Healthy appearance. Not in distress.  Eyes:     Conjunctiva/sclera: Conjunctivae normal.  Pulmonary:     Breath sounds: No wheezing. No rales.  Cardiovascular:      Normal rate. Irregularly irregular rhythm.  Edema:    Peripheral edema absent.  Abdominal:     Palpations: Abdomen is soft.  Musculoskeletal:     Cervical back: Neck supple. Skin:    General: Skin is cool and dry.  Neurological:     General: No focal deficit present.     Mental Status: Alert and oriented to person, place and time.      ASSESSMENT & PLAN:    1. Paroxysmal atrial fibrillation (HCC) Heart rate seems to be well controlled.  She is not on anticoagulation secondary to cirrhosis and portal hypertension as well as thrombocytopenia.  Continue rate control with diltiazem, digoxin and metoprolol tartrate.  Obtain digoxin level today.  2. Chronic combined systolic (congestive) and diastolic (congestive) heart failure (HCC) EF has been as low as 40-45%.  Most recent echocardiogram demonstrates normal LV function with EF of 55-60%.  She is NYHA III.  She is mainly limited by her interstitial lung disease.  Overall, her volume status appears stable.  Continue metoprolol tartrate, isosorbide, spironolactone.  As her ejection fraction has returned to normal and she has good rate control on diltiazem, it is acceptable to continue this medication.  3. Coronary artery disease involving native coronary artery of native heart without angina pectoris History of prior inferior myocardial infarction treated with PCI to the RCA.  Myoview in 2012 demonstrated inferior scar and small area of mild anterolateral ischemia.  She recently complained of anginal symptoms.  Her long-acting nitrates were resumed.  Since then, she has been doing well without further angina.  Continue isosorbide mononitrate 15 mg daily, diltiazem 120 mg daily, metoprolol tartrate 50 mg twice daily.  She is not on statin therapy.  I suspect that this is related to her liver disease.  Her LDL was in the 200 range back in November.  Her diet has improved and she has lost some weight.  I will obtain a follow-up lipid panel  today.   Dispo:  Return in about 6 months (around 05/21/2020) for Routine Follow Up, w/ Dr. Acie Fredrickson, in person.  Medication Adjustments/Labs and Tests Ordered: Current medicines are reviewed at length with the patient today.  Concerns regarding medicines are outlined above.  Tests Ordered: Orders Placed This Encounter  Procedures  . Lipid panel  . Digoxin level   Medication Changes: No orders of the defined types were placed in this encounter.   Signed, Richardson Dopp, PA-C  11/20/2019 9:32 AM    [facility] [place], [place], [place]  [address] Phone: [phone]; Fax: [phone]

## 2019-11-20 ENCOUNTER — Other Ambulatory Visit: Payer: Self-pay | Admitting: *Deleted

## 2019-11-20 ENCOUNTER — Other Ambulatory Visit: Payer: Self-pay

## 2019-11-20 ENCOUNTER — Ambulatory Visit (INDEPENDENT_AMBULATORY_CARE_PROVIDER_SITE_OTHER): Payer: HMO | Admitting: Physician Assistant

## 2019-11-20 ENCOUNTER — Encounter: Payer: Self-pay | Admitting: *Deleted

## 2019-11-20 ENCOUNTER — Encounter: Payer: Self-pay | Admitting: Physician Assistant

## 2019-11-20 VITALS — BP 116/58 | HR 75 | Ht 60.0 in | Wt 161.0 lb

## 2019-11-20 DIAGNOSIS — I480 Paroxysmal atrial fibrillation: Secondary | ICD-10-CM | POA: Diagnosis not present

## 2019-11-20 DIAGNOSIS — Z79899 Other long term (current) drug therapy: Secondary | ICD-10-CM

## 2019-11-20 DIAGNOSIS — I2510 Atherosclerotic heart disease of native coronary artery without angina pectoris: Secondary | ICD-10-CM

## 2019-11-20 DIAGNOSIS — I5042 Chronic combined systolic (congestive) and diastolic (congestive) heart failure: Secondary | ICD-10-CM | POA: Diagnosis not present

## 2019-11-20 NOTE — Patient Instructions (Signed)
Medication Instructions:   Your physician recommends that you continue on your current medications as directed. Please refer to the Current Medication list given to you today.  *If you need a refill on your cardiac medications before your next appointment, please call your pharmacy*  Lab Work:  You will have labs drawn today: Lipid panel/Digoxin level  If you have labs (blood work) drawn today and your tests are completely normal, you will receive your results only by: Marland Kitchen MyChart Message (if you have MyChart) OR . A paper copy in the mail If you have any lab test that is abnormal or we need to change your treatment, we will call you to review the results.  Testing/Procedures:  None ordered today  Follow-Up: At [facility], you and your health needs are our priority.  As part of our continuing mission to provide you with exceptional heart care, we have created designated Provider Care Teams.  These Care Teams include your primary Cardiologist (physician) and Advanced Practice Providers (APPs -  Physician Assistants and Nurse Practitioners) who all work together to provide you with the care you need, when you need it.  We recommend signing up for the patient portal called "MyChart".  Sign up information is provided on this After Visit Summary.  MyChart is used to connect with patients for Virtual Visits (Telemedicine).  Patients are able to view lab/test results, encounter notes, upcoming appointments, etc.  Non-urgent messages can be sent to your provider as well.   To learn more about what you can do with MyChart, go to [email or web address].    Your next appointment:   6 month(s)  The format for your next appointment:   In Person  Provider:   Mertie Moores, MD

## 2019-11-20 NOTE — Patient Outreach (Signed)
[place] [facility]) Care Management  11/20/2019  [name] [facility] [birth year] [id number]   Duplicate Encounter.  [name], BSW, MSW, LCSW  Licensed Education officer, environmental Health System  Mailing [place] [address], [place], [place] [address] Physical Address-[address], [place], [place] [address] Toll Free Main # [phone] Fax # [phone] Cell # [phone]  Office # [phone] [name]

## 2019-11-20 NOTE — Patient Outreach (Signed)
[place] [facility]) Care Management  11/20/2019  Ajia Chadderdon [facility] [birth year] [id number]   Duplicate Encounter.  Nat Christen, BSW, MSW, LCSW  Licensed Education officer, environmental Health System  Mailing [place] [address], [place], [place] [address] Physical Address-[address], [place], [place] [address] Toll Free Main # [phone] Fax # [phone] Cell # [phone]  Office # [phone] [email or web address]

## 2019-11-20 NOTE — Patient Outreach (Signed)
[place] [facility]) Care Management  11/20/2019  [name] [facility] 1933-11-10 [id number]   CSW made an initial attempt to try and contact patient's daughter, [name] today to perform the initial phone assessment on patient, as well as assess and assist with social work needs and services, without success.  A HIPAA compliant message was left for Mrs. [name] on [employer].  CSW is currently awaiting a return call.  CSW will make a second outreach attempt within the next 3-4 business days, if a return call is not received from Mrs. [name] in the meantime.  CSW will also mail an Outreach Letter to patient's home requesting that patient contact CSW if she is interested in receiving social work services through [place] with Scientist, clinical (histocompatibility and immunogenetics).  Nat Christen, BSW, MSW, LCSW  Licensed Education officer, environmental Health System  Mailing [place] [address], [place], [place] [address] Physical Address-[address], [place], [place] [address] Toll Free Main # [phone] Fax # [phone] Cell # [phone]  Office # [phone] [email or web address]

## 2019-11-21 ENCOUNTER — Telehealth: Payer: Self-pay | Admitting: Cardiovascular Disease

## 2019-11-21 LAB — LIPID PANEL
Chol/HDL Ratio: 4.2 ratio (ref 0.0–4.4)
Cholesterol, Total: 143 mg/dL (ref 100–199)
HDL: 34 mg/dL — ABNORMAL LOW (ref >39)
LDL Chol Calc (NIH): 81 mg/dL (ref 0–99)
Triglycerides: 159 mg/dL — ABNORMAL HIGH (ref 0–149)
VLDL Cholesterol Cal: 28 mg/dL (ref 5–40)

## 2019-11-21 LAB — DIGOXIN LEVEL: Digoxin, Serum: 3.2 ng/mL (ref 0.5–0.9)

## 2019-11-21 NOTE — Telephone Encounter (Signed)
[place] called to report critical value: Digoxin Level 3.2.   Will alert PA Kathlen Mody.   Jonay Hitchcock K. Marletta Lor, MD

## 2019-11-21 NOTE — Telephone Encounter (Signed)
See result note in lab section. Patient told to stop Digoxin. Will get ECG and repeat level Monday 4/5. She knows to go to the ED if she develops symptoms. Richardson Dopp, PA-C    11/21/2019 3:26 PM

## 2019-11-21 NOTE — Telephone Encounter (Signed)
I discussed w CMA Delton See) this pt's hx, and per family, despite what pt has told me continuously during her OV's, apparently, she has not been following with Endo regularly as she has reported to me  - Cornell Barman- only RF for 30d and to tell family/ daughter pt needs to f/up with Dr. Talmage Nap re: DM mgt since BS issues.

## 2019-11-23 ENCOUNTER — Other Ambulatory Visit: Payer: Self-pay

## 2019-11-23 ENCOUNTER — Other Ambulatory Visit: Payer: Self-pay | Admitting: Nurse Practitioner

## 2019-11-23 ENCOUNTER — Ambulatory Visit (INDEPENDENT_AMBULATORY_CARE_PROVIDER_SITE_OTHER): Payer: HMO | Admitting: Nurse Practitioner

## 2019-11-23 VITALS — BP 98/56 | HR 66 | Ht 60.0 in | Wt 166.0 lb

## 2019-11-23 DIAGNOSIS — E039 Hypothyroidism, unspecified: Secondary | ICD-10-CM | POA: Diagnosis not present

## 2019-11-23 DIAGNOSIS — T460X1A Poisoning by cardiac-stimulant glycosides and drugs of similar action, accidental (unintentional), initial encounter: Secondary | ICD-10-CM

## 2019-11-23 DIAGNOSIS — I10 Essential (primary) hypertension: Secondary | ICD-10-CM | POA: Diagnosis not present

## 2019-11-23 DIAGNOSIS — M8580 Other specified disorders of bone density and structure, unspecified site: Secondary | ICD-10-CM | POA: Diagnosis not present

## 2019-11-23 DIAGNOSIS — K7460 Unspecified cirrhosis of liver: Secondary | ICD-10-CM | POA: Diagnosis not present

## 2019-11-23 DIAGNOSIS — E1165 Type 2 diabetes mellitus with hyperglycemia: Secondary | ICD-10-CM | POA: Diagnosis not present

## 2019-11-23 DIAGNOSIS — E7800 Pure hypercholesterolemia, unspecified: Secondary | ICD-10-CM | POA: Diagnosis not present

## 2019-11-23 LAB — DIGOXIN LEVEL: Digoxin, Serum: 1.7 ng/mL — ABNORMAL HIGH (ref 0.5–0.9)

## 2019-11-23 NOTE — Telephone Encounter (Signed)
Left message for Rosey Bath regarding an appointment for patient today. Asked her to call me back at the office.

## 2019-11-23 NOTE — Progress Notes (Signed)
1.) Reason for visit: elevated digoxin level  2.) Name of MD requesting visit: Richardson Dopp, PA/Dr. Acie Fredrickson  3.) H&P: Patient presents for EKG due to elevated digoxin level found on lab work from Friday 4/2. Patient was called by Richardson Dopp, PA on Saturday and advised to discontinue her digoxin. He advised her of ER precautions and scheduled her for nurse visit today.   4.) ROS related to problem: Patient denies concerns, states she is feeling well. She took her last dose of digoxin on Saturday.  5.) Assessment and plan per MD: Do not restart digoxin. EKG reviewed and deemed stable. Digoxin level ordered and will advise further if that remains elevated. Otherwise, patient may keep follow-up for 6 months.

## 2019-11-23 NOTE — Telephone Encounter (Signed)
[name] is calling due to [name] requesting [name] come in today 11/23/19 for a ECG and repeat of labs. [name] states she is at work, but [name] is with [name]. Please call [name] at [phone] to inform [name] on when to come in today.

## 2019-11-23 NOTE — Patient Instructions (Signed)
Medication Instructions:  Your physician recommends that you continue on your current medications as directed. Please refer to the Current Medication list given to you today.  STAY OFF DIGOXIN (Lanoxin) *If you need a refill on your cardiac medications before your next appointment, please call your pharmacy*   Lab Work: TODAY - digoxin level If you have labs (blood work) drawn today and your tests are completely normal, you will receive your results only by: Marland Kitchen MyChart Message (if you have MyChart) OR . A paper copy in the mail If you have any lab test that is abnormal or we need to change your treatment, we will call you to review the results.   Testing/Procedures: None Ordered   Follow-Up: At [facility], you and your health needs are our priority.  As part of our continuing mission to provide you with exceptional heart care, we have created designated Provider Care Teams.  These Care Teams include your primary Cardiologist (physician) and Advanced Practice Providers (APPs -  Physician Assistants and Nurse Practitioners) who all work together to provide you with the care you need, when you need it.  We recommend signing up for the patient portal called "MyChart".  Sign up information is provided on this After Visit Summary.  MyChart is used to connect with patients for Virtual Visits (Telemedicine).  Patients are able to view lab/test results, encounter notes, upcoming appointments, etc.  Non-urgent messages can be sent to your provider as well.   To learn more about what you can do with MyChart, go to [email or web address].    Your next appointment:   6 month(s)  The format for your next appointment:   Either In Person or Virtual  Provider:   You may see Mertie Moores, MD or one of the following Advanced Practice Providers on your designated Care Team:    Richardson Dopp, PA-C  [place], [place]  Daune Perch, [place]

## 2019-11-23 NOTE — Addendum Note (Signed)
Addended by: Emmaline Life on: 11/23/2019 03:23 PM   Modules accepted: Orders

## 2019-11-25 ENCOUNTER — Encounter: Payer: Self-pay | Admitting: *Deleted

## 2019-11-25 ENCOUNTER — Other Ambulatory Visit: Payer: Self-pay | Admitting: *Deleted

## 2019-11-25 NOTE — Patient Outreach (Signed)
[place] [facility]) Care Management  11/25/2019  [name] [facility] 17-May-1934 [id number]   CSW was able to make initial contact with patient's daughter, [name] today to perform the initial phone assessment on patient, as well as assess and assist with social work needs and services.  CSW introduced self, explained role and types of services provided through [place] [employer] ([place] [employer]).  CSW further explained to Mrs. Aida Puffer that CSW works with patient's Telephonic RNCM, also with [place] [employer], Quinn Plowman.  CSW then explained the reason for the call, indicating that Mrs. Nyoka Cowden thought that patient would benefit from social work services and resources to assist with providing Mrs. Aida Puffer with a list of agencies that are able to notarize patient's Advanced Directives ([place] documents).  CSW obtained two HIPAA compliant identifiers from Mrs. Aida Puffer, which included patient's name and date of birth.  CSW explained to Mrs. Aida Puffer that she is able to take patient to any [place] affiliated facility to have her [place] documents witnessed and notarized, as long as the Garment/textile technologist was provided by [employer].  CSW further explained to Mrs. Aida Puffer that most primary care physician offices, also affiliated with [facility], have a notary on staff, and are willing to assist with notarization of the documents.  CSW last explained to Mrs. Aida Puffer that most banks, as long as patient is a member, are willing to notarize the documents for free.  Mrs. Aida Puffer was encouraged to complete the documents with patient, minus the signature and date, which will need to be completed in front of the witnesses and notary, then take patient, along with the completed documents, to an agency of their choice to have them witnessed and notarized.    Mrs. Aida Puffer was then encouraged to make copies of the  completed documents to give to all beneficiaries, keeping the original for patient, providing Dr. Raliegh Scarlet with a copy to scan into patient's EMR (Electronic Medical Record).  Mrs. Aida Puffer was most appreciative of the call and of CSW's knowledge on the topic.  CSW will perform a case closure on patient, as all goals of treatment have been met from social work standpoint and no additional social work needs have been identified at this time.  CSW will notify Mrs. Green of CSW's plans to close patient's case.  CSW will fax an update to Dr. Mellody Dance to ensure that they are aware of CSW's involvement with patient's plan of care.  CSW was able to confirm that Mrs. Aida Puffer has the correct contact information for CSW, encouraging Mrs. Aida Puffer to contact CSW directly if additional social work needs arise in the near future or assistance is needed with Advanced Directives completion.    Nat Christen, BSW, MSW, LCSW  Licensed Education officer, environmental Health System  Mailing [place] [address] [place], [place] [address] Physical Address-[address], [place], [place] [address] Toll Free Main # [phone] Fax # [phone] Cell # [phone]  Office # [phone] [email or web address]

## 2019-11-26 ENCOUNTER — Ambulatory Visit: Payer: HMO | Admitting: *Deleted

## 2019-12-07 DIAGNOSIS — H02831 Dermatochalasis of right upper eyelid: Secondary | ICD-10-CM | POA: Diagnosis not present

## 2019-12-07 DIAGNOSIS — H401132 Primary open-angle glaucoma, bilateral, moderate stage: Secondary | ICD-10-CM | POA: Diagnosis not present

## 2019-12-07 DIAGNOSIS — H0102B Squamous blepharitis left eye, upper and lower eyelids: Secondary | ICD-10-CM | POA: Diagnosis not present

## 2019-12-07 DIAGNOSIS — Z961 Presence of intraocular lens: Secondary | ICD-10-CM | POA: Diagnosis not present

## 2019-12-07 DIAGNOSIS — H04123 Dry eye syndrome of bilateral lacrimal glands: Secondary | ICD-10-CM | POA: Diagnosis not present

## 2019-12-07 DIAGNOSIS — H0102A Squamous blepharitis right eye, upper and lower eyelids: Secondary | ICD-10-CM | POA: Diagnosis not present

## 2019-12-07 DIAGNOSIS — H35033 Hypertensive retinopathy, bilateral: Secondary | ICD-10-CM | POA: Diagnosis not present

## 2019-12-07 DIAGNOSIS — E113553 Type 2 diabetes mellitus with stable proliferative diabetic retinopathy, bilateral: Secondary | ICD-10-CM | POA: Diagnosis not present

## 2019-12-07 DIAGNOSIS — H02834 Dermatochalasis of left upper eyelid: Secondary | ICD-10-CM | POA: Diagnosis not present

## 2019-12-07 DIAGNOSIS — Z794 Long term (current) use of insulin: Secondary | ICD-10-CM | POA: Diagnosis not present

## 2019-12-08 ENCOUNTER — Ambulatory Visit: Payer: HMO | Admitting: Family Medicine

## 2019-12-11 ENCOUNTER — Ambulatory Visit: Payer: Self-pay

## 2019-12-29 ENCOUNTER — Other Ambulatory Visit: Payer: Self-pay | Admitting: Gastroenterology

## 2019-12-29 DIAGNOSIS — K7469 Other cirrhosis of liver: Secondary | ICD-10-CM

## 2020-01-07 ENCOUNTER — Telehealth: Payer: Self-pay | Admitting: Cardiovascular Disease

## 2020-01-07 NOTE — Telephone Encounter (Signed)
New Message   Mariann Laster with [facility] is calling in reference to home health orders. She said that they faxed in the orders and she needs them sent back to her.   Home health certification for the dates of 3/6 - 5/4 Add on order for skill nursing to evaluate

## 2020-01-11 ENCOUNTER — Encounter (INDEPENDENT_AMBULATORY_CARE_PROVIDER_SITE_OTHER): Payer: Self-pay | Admitting: Family Medicine

## 2020-01-11 ENCOUNTER — Other Ambulatory Visit: Payer: Self-pay | Admitting: Family Medicine

## 2020-01-11 DIAGNOSIS — E039 Hypothyroidism, unspecified: Secondary | ICD-10-CM

## 2020-01-11 MED ORDER — ISOSORBIDE MONONITRATE ER 30 MG PO TB24: 15.0000 mg | ORAL_TABLET | Freq: Every day | ORAL | 11 refills | Status: DC

## 2020-01-11 NOTE — Telephone Encounter (Signed)
Please review

## 2020-01-12 NOTE — Telephone Encounter (Signed)
Left detailed message on [name]'s voice mail and advised that home health was not ordered by Dr. Elease Hashimoto. I advised her to call back with questions.

## 2020-01-13 ENCOUNTER — Other Ambulatory Visit: Payer: Self-pay

## 2020-01-13 DIAGNOSIS — F32A Depression, unspecified: Secondary | ICD-10-CM

## 2020-01-13 MED ORDER — ESCITALOPRAM OXALATE 20 MG PO TABS: 20.0000 mg | ORAL_TABLET | Freq: Every day | ORAL | 0 refills | Status: DC

## 2020-01-13 MED ORDER — MONTELUKAST SODIUM 10 MG PO TABS: 10.0000 mg | ORAL_TABLET | Freq: Every evening | ORAL | 0 refills | Status: DC

## 2020-01-15 ENCOUNTER — Ambulatory Visit: Payer: Self-pay

## 2020-01-21 NOTE — Telephone Encounter (Signed)
New message   Per Mariann Laster is calling back to check on home health orders per the previous message. Please call to discuss.

## 2020-01-21 NOTE — Telephone Encounter (Signed)
Attempted to return call to [place] but I did not have her extension and was unable to get a connection through the voice mail system. Will try again later.

## 2020-01-27 MED ORDER — DILTIAZEM HCL ER COATED BEADS 120 MG PO CP24: 120.0000 mg | ORAL_CAPSULE | Freq: Every day | ORAL | 2 refills | Status: DC

## 2020-01-29 ENCOUNTER — Other Ambulatory Visit: Payer: HMO

## 2020-01-29 NOTE — Telephone Encounter (Signed)
Follow up    Mariann Laster from [facility] Is calling regarding orders    Please call her back at [phone], she says to ask for her and a rep will transfer the call, if she is unavailable, she asks to leave a voicemail

## 2020-01-29 NOTE — Telephone Encounter (Signed)
Left voicemail message for [name] stating according to Dr Elmarie Shiley RN's previous note 01/12/2020, Dr Acie Fredrickson did not order home health for this pt.  If needed please contact Martie Round next week at [phone].

## 2020-02-03 ENCOUNTER — Ambulatory Visit: Payer: Self-pay

## 2020-02-03 ENCOUNTER — Other Ambulatory Visit: Payer: Self-pay | Admitting: Physician Assistant

## 2020-02-03 MED ORDER — MONTELUKAST SODIUM 10 MG PO TABS: 10.0000 mg | ORAL_TABLET | Freq: Every evening | ORAL | 0 refills | Status: DC

## 2020-02-05 NOTE — Telephone Encounter (Signed)
Received home health orders addressed to Dr. Acie Fredrickson to sign. Called and spoke with Mariann Laster at [facility] and advised that Dr. Acie Fredrickson did not order home health for this pt. She verbalized understanding and thanked me for the call.

## 2020-02-12 ENCOUNTER — Other Ambulatory Visit: Payer: Self-pay

## 2020-02-12 NOTE — Patient Outreach (Signed)
  [facility]) Care Management Chronic Special Needs Program    02/12/2020  Name: [name], DOB: February 28, 1934  MRN: [id number]   [name] is enrolled in a chronic special needs plan for Diabetes. Telephone call to client for CSNP follow up assessment. Unable to reach. HIPAA compliant voice message left with call back phone number and return call request.   PLAN; RNCM will attempt 2nd telephone call to client within 1 week.   George Ina RN,BSN,CCM Chronic Care Management Coordinator [facility] Care Management [phone]

## 2020-02-15 ENCOUNTER — Other Ambulatory Visit: Payer: Self-pay

## 2020-02-15 NOTE — Patient Outreach (Signed)
  [facility] [place] [facility]) Care Management Chronic Special Needs Program    02/15/2020  Name: [name], DOB: 10/25/1933  MRN: [id number]   [name] is enrolled in a chronic special needs plan for Diabetes. Telephone call to client/ daughter [name] for CSNP assessment follow up. Unable to reach. HIPAA compliant voice message left with call back phone number and return call request.   PLAN; RNCM will attempt 3rd telephone call to client and/ or daughter within 1 week.   Quinn Plowman RN,BSN,CCM [facility] Care Management [phone]

## 2020-02-16 ENCOUNTER — Other Ambulatory Visit: Payer: Self-pay

## 2020-02-17 ENCOUNTER — Other Ambulatory Visit: Payer: Self-pay

## 2020-02-17 NOTE — Patient Outreach (Signed)
  [facility]) Care Management Chronic Special Needs Program    02/17/2020  Name: [name], DOB: 1934-04-21  MRN: [id number]  Late entry for 02/16/20 [name] is enrolled in a chronic special needs plan for Diabetes. Telephone call to client's daughter/ designated party release, [name]. Unable to reach. HIPAA compliant voice message left with call back phone number.   PLAN; RNCM will attempt 3rd telephone outreach in 1 week.   George Ina RN,BSN,CCM Chronic Care Management Coordinator [facility] Care Management [phone]

## 2020-02-17 NOTE — Patient Outreach (Signed)
[facility]) Care Management Chronic Special Needs Program  02/17/2020  Name: [name] DOB: 01-Aug-1934  MRN: [id number]  [name] is enrolled in a chronic special needs plan for Diabetes.Telephone call to client's daughter/ designated party release,  [name] for CSNP assessment follow up. HIPAA verified by daughter for client. Daughter states client is doing really well overall. 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 She reports client continues to live alone with great family support that lives nearby. She states client receives meals on wheels.  Daughter states she manages clien'ts medications and assists with accompanying patient to her doctor appointments. Daughter reports client is able to manage her blood sugar checks with the freestyle libre.  She reports client is able to afford her medications and takes them as prescribed.   Goals Addressed              This Visit's Progress   .  "write a new advance directive" (pt-stated)   On track     Daughter states she and client have completed the Advanced Directive but she is unable to find a place that will notarize. She reports attempting several places to have notarized without success.   RN case [employer] and confirmed they have notaries.  Contact your local [employer] and / or postal services for notary.     .  Advanced Care Planning complete by 2021   On track     Daughter states she and client have completed the Advanced Directive but she is unable to find a place that will notarize. She reports attempting several places to have notarized without success.   RN case [employer] and confirmed they have notaries.  Contact your local [employer] and / or postal services for notary.    .  Client understands the importance of follow-up with providers by attending scheduled visits   On track     Primary care provider telephone  appointment 10/27/19. Daughter reports clients Annual wellness visit is scheduled for September 2021. Cardiology visit 11/20/19 Continue to maintain and keep follow up visits with your providers.      .  Client verbalize knowledge of Heart Failure disease self management skills within 6 months   On track     Review [employer] calendar sent in the mail for Heart failure information and action plan.  Continue to take your medications as prescribed.  Contact your doctor if you have questions / concerns.      .  Client will report no worsening of symptoms of Atrial Fibrillation within the next 4-5 months   On track     Daughter reports client's atrial fibrillation has been stable with doctor adjusting medications.  Continue to take your medications as prescribed.  Review [employer] calendar sent in the mail for Atrial fibrillation information and action plan.  Follow up with your cardiologist as recommended.     .  COMPLETED: Client will report no worsening of symptoms related to heart disease within the next 4-5 months        Daughter states client has not reported any worsening symptoms related to heart disease.     .  client will report obtaining a new primary care provider in 3 months.   On track     Contact your health team advantage concierge for questions regarding participating plan primary care providers.  Arrange follow up visit  with new plan provider.  Contact your RN case manager if assistance is needed.     .  Client will verbalize knowledge of chronic lung disease as evidenced by no ED visits or Inpatient stays related to chronic lung disease    On track     Review [employer] calendar sent in the mail for COPD information and action plan.  RN case manager will mail client education article: COPD exacerbation, Adult ED.  Contact your provider for any signs and symptoms  of mild shortness of breath.      .  Client will verbalize knowledge of self  management of Hypertension as evidences by BP reading of 140/90 or less; or as defined by provider   On track     Assessed daughters hypertension self management skills for client Plan to check blood pressure regularly.  Write results in your [employer] calendar Continue to take your medications as prescribed.  Continue to adhere to a low salt diet.     .  Decrease inpatient diabetes admissions/readmissions with in the year   On track     Continue diabetes self management actions:  Glucose monitoring per provider recommendation  Visit provider every 3-6 months as directed  Hbg A1C level every 3-6 months.  Carbohydrate controlled meal planning  Taking diabetes medication as prescribed by provider  Physical activity  Contact your doctor if you have questions/ concerns.     .  COMPLETED: General - Client will not be readmitted within 30 days (C-SNP)        Daughter reports client has not had any hospital readmissions.     Marland Kitchen  HEMOGLOBIN A1C < 7        Daughter states client is enjoying the ease of using her free style libre to monitor her blood sugars.  Continue to eat low carbohydrate and low salt meals, watch portion sizes and avoid sugar sweetened drinks. Daughter report client receives meals on wheels.  Continue diabetes self management actions:  Glucose monitoring per provider recommendation  Visit provider every 3-6 months as directed  Hbg A1C level every 3-6 months.  Carbohydrate controlled meal planning  Taking diabetes medication as prescribed by provider  Physical activity     .  Maintain timely refills of diabetic medication as prescribed within the year .   On track     Daughter reports client maintains timely refills of her diabetic medications.  Daughter reports she assists client with medication management.  Medication review completed with daughter for client from medical record.  Continue to take your medication as prescribed.  Contact your  doctor if you have questions regarding your medicine.     .  COMPLETED: Obtain annual  Lipid Profile, LDL-C   On track     Lipid profile completed on 11/20/19 Getting your cholesterol levels checked is an important part of staying healthy. High Cholesterol increases your risk for heart disease and stroke.       .  COMPLETED: Obtain Annual Eye (retinal)  Exam         Diabetic eye exam completed June 2021  Continue to schedule your eye exam yearly     .  Obtain Annual Foot Exam   On track     Annual foot exam 03/12/18.   Your doctor should check your bare feet at each visit It is important that your doctor check your feet regularly. Discuss this with your doctor at your next visit.     Marland Kitchen  Obtain annual screen for micro albuminuria (urine) , nephropathy (kidney problems)   On track     Urine tested for protein on 08/26/18 Diabetes can affect your kidneys. It is important for your doctor to check your urine at least once a year. These test show how your kidney's  are working. Discuss this with your doctor at your next visit.     Clydene Pugh Hemoglobin A1C at least 2 times per year   On track     Hgb A1C assessed on 06/19/19 and 10/15/19 Continue to keep your follow up appointments with your provider and have lab work completed as recommended.      .  patient will achieve optimal functioning within limits of visual impairment as evidenced by ability to care for self, navigate enviornment safely, and engage in meaningful activity   On track     Remove environmental barriers to ensure safety( avoid leaving doors partially open, do not make unnecessary changes in the environment) Provide adequate lighting Placed frequently used items within the Clients range of vision or reach Use visual aids when appropriate ( magnifying glass, or large type printed books/ magazines. Encourage use of touch  Hold both arms of chair before sitting and to feel for the seat on chairs or sofas without arms.      .   Visit Primary Care Provider or Endocrinologist at least 2 times per year    On track     Primary care provider visit 10/27/19 and 08/19/19 Complete your annual wellness visit every year, attend follow up visits and complete lab work as recommended by your providers.         Plan:  Send successful outreach letter with a copy of their individualized care plan, Send individual care plan to provider and Send educational material  Chronic care management coordinator will outreach in:  3 Months    George Ina RN,BSN,CCM Chronic Care Management Coordinator [facility] Management [phone]    .

## 2020-02-19 ENCOUNTER — Ambulatory Visit: Payer: Self-pay

## 2020-02-26 ENCOUNTER — Ambulatory Visit: Payer: Self-pay

## 2020-03-02 ENCOUNTER — Other Ambulatory Visit (INDEPENDENT_AMBULATORY_CARE_PROVIDER_SITE_OTHER): Payer: Self-pay | Admitting: Family Medicine

## 2020-03-02 DIAGNOSIS — F32A Depression, unspecified: Secondary | ICD-10-CM

## 2020-03-03 ENCOUNTER — Other Ambulatory Visit: Payer: Self-pay

## 2020-03-03 MED ORDER — MONTELUKAST SODIUM 10 MG PO TABS: 10.0000 mg | ORAL_TABLET | Freq: Every evening | ORAL | 0 refills | Status: DC

## 2020-03-03 NOTE — Telephone Encounter (Signed)
Pt's daughter, [name], informed that pt needs f/u appt prior to any further refills.  Charyl Bigger, CMA

## 2020-03-07 ENCOUNTER — Ambulatory Visit
Admission: RE | Admit: 2020-03-07 | Discharge: 2020-03-07 | Disposition: A | Discharge status: Home / Self Care | Payer: HMO | Source: Ambulatory Visit | Attending: Gastroenterology | Admitting: Gastroenterology

## 2020-03-07 DIAGNOSIS — H35033 Hypertensive retinopathy, bilateral: Secondary | ICD-10-CM | POA: Diagnosis not present

## 2020-03-07 DIAGNOSIS — Z961 Presence of intraocular lens: Secondary | ICD-10-CM | POA: Diagnosis not present

## 2020-03-07 DIAGNOSIS — H0102A Squamous blepharitis right eye, upper and lower eyelids: Secondary | ICD-10-CM | POA: Diagnosis not present

## 2020-03-07 DIAGNOSIS — H401132 Primary open-angle glaucoma, bilateral, moderate stage: Secondary | ICD-10-CM | POA: Diagnosis not present

## 2020-03-07 DIAGNOSIS — H16223 Keratoconjunctivitis sicca, not specified as Sjogren's, bilateral: Secondary | ICD-10-CM | POA: Diagnosis not present

## 2020-03-07 DIAGNOSIS — Z794 Long term (current) use of insulin: Secondary | ICD-10-CM | POA: Diagnosis not present

## 2020-03-07 DIAGNOSIS — E113553 Type 2 diabetes mellitus with stable proliferative diabetic retinopathy, bilateral: Secondary | ICD-10-CM | POA: Diagnosis not present

## 2020-03-07 DIAGNOSIS — R188 Other ascites: Secondary | ICD-10-CM | POA: Diagnosis not present

## 2020-03-07 DIAGNOSIS — K7469 Other cirrhosis of liver: Secondary | ICD-10-CM

## 2020-03-07 DIAGNOSIS — H0102B Squamous blepharitis left eye, upper and lower eyelids: Secondary | ICD-10-CM | POA: Diagnosis not present

## 2020-03-07 DIAGNOSIS — N281 Cyst of kidney, acquired: Secondary | ICD-10-CM | POA: Diagnosis not present

## 2020-03-07 DIAGNOSIS — H02834 Dermatochalasis of left upper eyelid: Secondary | ICD-10-CM | POA: Diagnosis not present

## 2020-03-07 DIAGNOSIS — K7460 Unspecified cirrhosis of liver: Secondary | ICD-10-CM | POA: Diagnosis not present

## 2020-03-07 DIAGNOSIS — H02831 Dermatochalasis of right upper eyelid: Secondary | ICD-10-CM | POA: Diagnosis not present

## 2020-03-09 DIAGNOSIS — M8580 Other specified disorders of bone density and structure, unspecified site: Secondary | ICD-10-CM | POA: Diagnosis not present

## 2020-03-09 DIAGNOSIS — E039 Hypothyroidism, unspecified: Secondary | ICD-10-CM | POA: Diagnosis not present

## 2020-03-09 DIAGNOSIS — K7460 Unspecified cirrhosis of liver: Secondary | ICD-10-CM | POA: Diagnosis not present

## 2020-03-09 DIAGNOSIS — I10 Essential (primary) hypertension: Secondary | ICD-10-CM | POA: Diagnosis not present

## 2020-03-09 DIAGNOSIS — E1165 Type 2 diabetes mellitus with hyperglycemia: Secondary | ICD-10-CM | POA: Diagnosis not present

## 2020-03-09 DIAGNOSIS — E7800 Pure hypercholesterolemia, unspecified: Secondary | ICD-10-CM | POA: Diagnosis not present

## 2020-03-15 ENCOUNTER — Other Ambulatory Visit: Payer: Self-pay

## 2020-03-15 DIAGNOSIS — E039 Hypothyroidism, unspecified: Secondary | ICD-10-CM

## 2020-03-15 MED ORDER — LEVOTHYROXINE SODIUM 100 MCG PO TABS: 100.0000 ug | ORAL_TABLET | Freq: Every day | ORAL | 0 refills | Status: DC

## 2020-03-24 DIAGNOSIS — R001 Bradycardia, unspecified: Secondary | ICD-10-CM | POA: Diagnosis not present

## 2020-03-24 DIAGNOSIS — T1490XA Injury, unspecified, initial encounter: Secondary | ICD-10-CM | POA: Diagnosis not present

## 2020-03-24 DIAGNOSIS — R531 Weakness: Secondary | ICD-10-CM | POA: Diagnosis not present

## 2020-04-05 ENCOUNTER — Other Ambulatory Visit: Payer: Self-pay

## 2020-04-05 ENCOUNTER — Encounter: Payer: Self-pay | Admitting: Physician Assistant

## 2020-04-05 ENCOUNTER — Ambulatory Visit: Payer: HMO | Admitting: Physician Assistant

## 2020-04-05 DIAGNOSIS — K219 Gastro-esophageal reflux disease without esophagitis: Secondary | ICD-10-CM

## 2020-04-05 MED ORDER — PANTOPRAZOLE SODIUM 20 MG PO TBEC: 20.0000 mg | DELAYED_RELEASE_TABLET | Freq: Two times a day (BID) | ORAL | 0 refills | Status: DC

## 2020-04-05 MED ORDER — METOPROLOL TARTRATE 50 MG PO TABS: 25.0000 mg | ORAL_TABLET | Freq: Every morning | ORAL | 3 refills | Status: DC

## 2020-04-05 MED ORDER — SPIRONOLACTONE 100 MG PO TABS: 50.0000 mg | ORAL_TABLET | Freq: Every morning | ORAL | 3 refills | Status: AC

## 2020-04-15 DIAGNOSIS — R188 Other ascites: Secondary | ICD-10-CM | POA: Diagnosis not present

## 2020-04-15 DIAGNOSIS — K7581 Nonalcoholic steatohepatitis (NASH): Secondary | ICD-10-CM | POA: Diagnosis not present

## 2020-04-15 DIAGNOSIS — K7460 Unspecified cirrhosis of liver: Secondary | ICD-10-CM | POA: Diagnosis not present

## 2020-04-15 DIAGNOSIS — K7290 Hepatic failure, unspecified without coma: Secondary | ICD-10-CM | POA: Diagnosis not present

## 2020-04-15 DIAGNOSIS — D696 Thrombocytopenia, unspecified: Secondary | ICD-10-CM | POA: Diagnosis not present

## 2020-04-21 ENCOUNTER — Encounter: Payer: Self-pay | Admitting: Physician Assistant

## 2020-05-02 ENCOUNTER — Ambulatory Visit: Payer: Self-pay

## 2020-05-03 ENCOUNTER — Other Ambulatory Visit: Payer: Self-pay

## 2020-05-03 NOTE — Patient Outreach (Signed)
  [place] [facility]) Care Management Chronic Special Needs Program    05/03/2020  Name: [name], DOB: [birth year]  MRN: [id number]   [name] is enrolled in a chronic special needs plan for Diabetes. Telephone call to client's daughter, [name].  Unable to reach. HIPAA compliant voice message left with call back phone number and return call request.   PLAN; RNCM will attempt 2nd telephone call to client in 2 weeks.   Quinn Plowman RN,BSN,CCM [facility] Care Management [phone]

## 2020-05-09 ENCOUNTER — Other Ambulatory Visit: Payer: Self-pay

## 2020-05-09 NOTE — Patient Outreach (Signed)
  [place] [facility]) Care Management Chronic Special Needs Program    05/09/2020  Name: [name], DOB: 12/12/1933  MRN: [id number]   [name] is enrolled in a chronic special needs plan for Diabetes. Telephone call to client's daughter [name] for CSNP assessment follow up. Unable to reach. HIPAA compliant voice message left with call back phone number and return call request.   PLAN:  RNCM will attempt 3rd telephone outreach in 2 weeks.   Quinn Plowman RN,BSN,CCM [facility] Care Management [phone]

## 2020-05-10 ENCOUNTER — Ambulatory Visit (INDEPENDENT_AMBULATORY_CARE_PROVIDER_SITE_OTHER): Payer: HMO | Admitting: Physician Assistant

## 2020-05-10 ENCOUNTER — Other Ambulatory Visit: Payer: Self-pay

## 2020-05-10 ENCOUNTER — Encounter: Payer: Self-pay | Admitting: Physician Assistant

## 2020-05-10 VITALS — Ht 60.0 in | Wt 157.0 lb

## 2020-05-10 DIAGNOSIS — R52 Pain, unspecified: Secondary | ICD-10-CM | POA: Diagnosis not present

## 2020-05-10 DIAGNOSIS — E1069 Type 1 diabetes mellitus with other specified complication: Secondary | ICD-10-CM | POA: Diagnosis not present

## 2020-05-10 DIAGNOSIS — E1165 Type 2 diabetes mellitus with hyperglycemia: Secondary | ICD-10-CM | POA: Diagnosis not present

## 2020-05-10 DIAGNOSIS — J069 Acute upper respiratory infection, unspecified: Secondary | ICD-10-CM

## 2020-05-10 DIAGNOSIS — E1159 Type 2 diabetes mellitus with other circulatory complications: Secondary | ICD-10-CM | POA: Diagnosis not present

## 2020-05-10 DIAGNOSIS — Z9181 History of falling: Secondary | ICD-10-CM | POA: Diagnosis not present

## 2020-05-10 DIAGNOSIS — I10 Essential (primary) hypertension: Secondary | ICD-10-CM | POA: Diagnosis not present

## 2020-05-10 DIAGNOSIS — E782 Mixed hyperlipidemia: Secondary | ICD-10-CM | POA: Diagnosis not present

## 2020-05-10 NOTE — Progress Notes (Signed)
Telehealth office visit note for Madison Reid, PA-C- at Primary Care at [facility]   I connected with current patient today by telephone and verified that I am speaking with the correct person   . Location of the patient: Home . Location of the provider: Office - This visit type was conducted due to national recommendations for restrictions regarding the COVID-19 Pandemic (e.g. social distancing) in an effort to limit this patient's exposure and mitigate transmission in our community.    - No physical exam could be performed with this format, beyond that communicated to [place] by the patient/ family members as noted.   - Additionally my office staff/ schedulers were to discuss with the patient that there may be a monetary charge related to this service, depending on their medical insurance.  My understanding is that patient understood and consented to proceed.     _________________________________________________________________________________   History of Present Illness: Pt calls in to follow up on hypertension and diabetes mellitus. Pt also has complaints of maxillary pressure, runny nose, and postnasal drainage x more than 1 week. She has been using nasal spray with minimal relief. Denies fever, cough, shortness of breath or chills. Also reports a fall that occurred about 2 weeks ago where she slipped on carpet and fell down. Denies head injury and reports bruising. She did go to the ED for evaluation. Also reports intermittent left side pain x 2-3 days and has been applying heat which has provided relief. Denies abdominal pain or other GI symptoms, bowel or bladder dysfunction.   Diabetes: Followed by endocrinology. Pt denies increased urination or thirst. Pt reports medication compliance. No hypoglycemic events. Checking glucose at home. On average FBS range in 120s.   HTN: Pt denies new onset chest pain, palpitations, dizziness or leg swelling. Taking medication as directed without  side effects. Checks BP at home and readings range in around or less than 130s/80.       GAD 7 : Generalized Anxiety Score 10/27/2019 12/31/2018  Nervous, Anxious, on Edge 0 0  Control/stop worrying 0 0  Worry too much - different things 0 0  Trouble relaxing 1 0  Restless 0 0  Easily annoyed or irritable 0 0  Afraid - awful might happen 0 0  Total GAD 7 Score 1 0  Anxiety Difficulty Not difficult at all Not difficult at all    Depression screen [facility] 2/9 05/13/2020 05/10/2020 11/25/2019 10/27/2019 08/19/2019  Decreased Interest (No Data) 0 1 3 0  Down, Depressed, Hopeless (No Data) 0 0 0 0  PHQ - 2 Score - 0 1 3 0  Altered sleeping - 0 - 0 0  Tired, decreased energy - 0 - 3 1  Change in appetite - 0 - 1 0  Feeling bad or failure about yourself  - 0 - 0 0  Trouble concentrating - 0 - 0 0  Moving slowly or fidgety/restless - 0 - 1 0  Suicidal thoughts - 0 - 0 0  PHQ-9 Score - 0 - 8 1  Difficult doing work/chores - - - Somewhat difficult -  Some recent data might be hidden      Impression and Recommendations:     1. Poorly controlled diabetes mellitus (HCC)-management per Endo, Dr. Soyla Murphy   2. Hypertension associated with diabetes ([place])   3. Mixed diabetic hyperlipidemia associated with type 1 diabetes mellitus ([place])   4. Upper respiratory tract infection, unspecified type   5. Pain of left side  of body   6. History of fall     Diabetes mellitus: -Followed by Endocrinology. -Continue current medication regimen. -Continue ambulatory glucose monitoring. -Follow a low carbohydrate and glucose diet.  Hypertension associated with diabetes: -BP stable -Continue current medication regimen. -Follow a low sodium diet and stay well hydrated. -Will continue to monitor.  Mixed diabetic hyperlipidemia associated with diabetes: -Last lipid panel: total cholesterol 143, triglycerides 159, HDL 34, LDL 81 -Pt has liver cirrhosis so not on statin therapy. -Follow a heart healthy diet low  in saturated and trans fats. -Will continue to monitor.  URI: -Symptoms have been ongoing for >10 days so will start Azithromycin (pt has allergy to penicillins). -Recommend to continue with home supportive therapy, nasal spray and nasal rinses especially before using spray.  Left side pain, history of fall: -Etiology unclear and could be muscular related since heat helps. Reviewed imaging done at [facility] ED and no acute bony abnormalities were found of chest, cervical and lumbar, so less likely rib fracture. -Recommend to continue with applying heat and if symptoms fail to improve or worsen then recommend further evaluation.   - As part of my medical decision making, I reviewed the following data within the [place] History obtained from pt /family, CMA notes reviewed and incorporated if applicable, Labs reviewed, Radiograph/ tests reviewed if applicable and OV notes from prior OV's with me, as well as any other specialists she/he has seen since seeing me last, were all reviewed and used in my medical decision making process today.    - Additionally, when appropriate, discussion had with patient regarding our treatment plan, and their biases/concerns about that plan were used in my medical decision making today.    - The patient agreed with the plan and demonstrated an understanding of the instructions.   No barriers to understanding were identified.     - The patient was advised to call back or seek an in-person evaluation if the symptoms worsen or if the condition fails to improve as anticipated.   Return for DM, mood, hypothyroid and FBW.    No orders of the defined types were placed in this encounter.   Meds ordered this encounter  Medications  . azithromycin (ZITHROMAX) 250 MG tablet    Sig: Take 2 tablets by mouth on day 1, then take 1 tablet by mouth once daily x 4 days.    Dispense:  6 each    Refill:  0    Order Specific Question:   Supervising  Provider    Answer:   Beatrice Lecher D [[id number]]    There are no discontinued medications.     Time spent on visit including pre-visit chart review and post-visit care was 23 minutes.      The [place] was signed into law in 2016 which includes the topic of electronic health records.  This provides immediate access to information in MyChart.  This includes consultation notes, operative notes, office notes, lab results and pathology reports.  If you have any questions about what you read please let us know at your next visit or call us at the office.  We are right here with you.   __________________________________________________________________________________     Patient Care Team    Relationship Specialty Notifications Start End  [name], [place] PCP - General   12/20/19   Nahser, Wonda Cheng, MD PCP - Cardiology Cardiology Admissions 10/28/18   Brand Males, MD Attending Physician Pulmonary Disease Abnormal results  only, Admissions 12/07/12    Comment: severe COPD  Jacelyn Pi, MD Consulting Physician Endocrinology  05/22/17   Marice Potter, MD Consulting Physician Hematology and Oncology  10/16/18   Hayden Pedro, MD Consulting Physician Ophthalmology  11/25/18    Comment: DM retinop specialist  Isaiah Serge, NP Nurse Practitioner Cardiology  12/31/18    Comment: works with dr. Patriciaann Clan, Jeneen Rinks, MD (Inactive) Consulting Physician Gastroenterology  05/05/19    Comment: Howie Ill ;  I believe  Dannielle Karvonen, [place] Management  Admissions 10/14/19    Comment: Chronic Special Needs Plan Chronic Care Management Coordinator      -Vitals obtained; medications/ allergies reconciled;  personal medical, social, Sx etc.histories were updated by CMA, reviewed by me and are reflected in chart   Patient Active Problem List   Diagnosis Date Noted  . Class 1 obesity 10/19/2019  . Atrial fibrillation with RVR ([place])   .  COPD with acute exacerbation ([place]) 10/14/2019  . Environmental and seasonal allergies 06/03/2019  . Ascites of liver 05/05/2019  . Portal hypertension ([place]) 05/05/2019  . Decompensated hepatic cirrhosis ([place]) 04/17/2019  . Myocardial infarct, old 01/01/2019  . Paroxysmal atrial fibrillation ([place]) 10/28/2018  . Liver cirrhosis secondary to NASH ([place]) 10/16/2018  . Pancytopenia ([place]) 09/26/2018  . COPD exacerbation ([place]) 09/26/2018  . Acute on chronic diastolic CHF (congestive heart failure) ([place]) 09/26/2018  . Acute on chronic respiratory failure with hypoxia ([place]) 09/26/2018  . PNA (pneumonia) 09/15/2018  . Microalbuminuria due to type 2 diabetes mellitus ([place]) 08/26/2018  . Hypocalcemia 03/12/2018  . Gastroesophageal reflux disease 12/31/2017  . chronic Low platelet count 06/05/2017  . Hypertension associated with diabetes ([place]) 05/22/2017  . Morbidly obese ([place])- BMI >er 35 w DM 06/23/2016  . chronic Leukopenia 05/11/2016  . Vitamin D deficiency 05/11/2016  . Elevated serum alkaline phosphatase level- hepatocellular in origin 05/11/2016  . Depression 03/13/2016  . Hypothyroidism 03/13/2016  . Poorly controlled diabetes mellitus ([place]) 03/13/2016  . Mixed diabetic hyperlipidemia associated with type 1 diabetes mellitus ([place]) 03/13/2016  . Constipation, chronic/  IBS syndrome 03/13/2016  . At high risk for osteoporosis 03/13/2016  . Hypotension 12/13/2012  . Hypokalemia due to excessive renal loss of potassium 12/13/2012  . Thrombocytopenia, unspecified ([place]) 12/13/2012  . h/o Clostridium difficile colitis 12/12/2012  . History of GI bleed 12/12/2012  . ILD (interstitial lung disease) ([place]) 11/25/2012  . Leg edema 01/07/2012  . Coronary artery disease 04/11/2011  . COPD (chronic obstructive pulmonary disease) ([place]) 04/11/2011  . Obstructive sleep apnea 04/11/2011  . Chronic diastolic heart failure ([place]) 03/29/2008  . Atherosclerosis of carotid arteries 04/03/2006     Current  Meds  Medication Sig  . albuterol (VENTOLIN HFA) 108 (90 Base) MCG/ACT inhaler INHALE 2 PUFFS BY MOUTH EVERY 6 HOURS AS NEEDED FOR WHEEZING OR SHORTNESS OF BREATH  . brimonidine (ALPHAGAN) 0.2 % ophthalmic solution Place 1 drop into both eyes daily.  . Cholecalciferol (VITAMIN D3) 50 MCG (2000 UT) TABS Take by mouth daily.   Marland Kitchen diltiazem (CARDIZEM CD) 120 MG 24 hr capsule Take 1 capsule (120 mg total) by mouth daily.  Marland Kitchen escitalopram (LEXAPRO) 20 MG tablet Take 1 tablet (20 mg total) by mouth daily.  . furosemide (LASIX) 40 MG tablet Take 1 tablet by mouth in the morning daily.  You may take additional dose in the afternoon for edema/swelling  . insulin glargine (LANTUS) 100 UNIT/ML injection Inject 30 Units into the  skin at bedtime.  . isosorbide mononitrate (IMDUR) 30 MG 24 hr tablet Take 0.5 tablets (15 mg total) by mouth daily.  Marland Kitchen levocetirizine (XYZAL) 5 MG tablet Take 1 tablet (5 mg total) by mouth every evening. During allergy season only  . levothyroxine (SYNTHROID) 100 MCG tablet Take 1 tablet (100 mcg total) by mouth daily.  . metoprolol tartrate (LOPRESSOR) 50 MG tablet Take 0.5 tablets (25 mg total) by mouth in the morning.  . montelukast (SINGULAIR) 10 MG tablet Take 1 tablet (10 mg total) by mouth every evening. **PATIENT NEEDS APT FOR FURTHER REFILLS**  . Multiple Vitamins-Minerals (VISION FORMULA/LUTEIN) TABS Take 1 tablet by mouth daily.  . nitroGLYCERIN (NITROSTAT) 0.4 MG SL tablet PLACE 1 TABLET UNDER THE TONGUE EVERY 5 MINUTES AS NEEDED FOR CHEST PAIN  . pantoprazole (PROTONIX) 20 MG tablet Take 1 tablet (20 mg total) by mouth 2 (two) times daily. I po qam can add I po qpm prn  . RESTASIS 0.05 % ophthalmic emulsion Place 1 drop into both eyes 2 (two) times daily. 1 drop both eyes twice daily  . rifaximin (XIFAXAN) 550 MG TABS tablet Take 550 mg by mouth 2 (two) times daily.  Marland Kitchen spironolactone (ALDACTONE) 100 MG tablet Take 0.5 tablets (50 mg total) by mouth in the morning.      Allergies:  Allergies  Allergen Reactions  . Ace Inhibitors Cough  . Benadryl [Diphenhydramine Hcl (Sleep)] Other (See Comments)    hypotension  . Fexofenadine Hcl Other (See Comments)  . Penicillins Hives and Swelling    Swelling of arms Has patient had a PCN reaction causing immediate rash, facial/tongue/throat swelling, SOB or lightheadedness with hypotension: unknown Has patient had a PCN reaction causing severe rash involving mucus membranes or skin necrosis: no Has patient had a PCN reaction that required hospitalization: unknown Has patient had a PCN reaction occurring within the last 10 years: no, childhood allergy If all of the above answers are "NO", then may proceed with Cephalosporin use.   . Sulfonamide Derivatives Swelling  . Sulfa Antibiotics Other (See Comments)    hypotension     ROS:  See above HPI for pertinent positives and negatives   Objective:   Height 5' (1.524 m), weight 157 lb (71.2 kg).  (if some vitals are omitted, this means that patient was UNABLE to obtain them even though they were asked to get them prior to OV today.  They were asked to call us at their earliest convenience with these once obtained. ) General: A & O * 3; sounds in no acute distress;  Respiratory: speaking in full sentences, no conversational dyspnea Psych: insight appears good, mood- appears full

## 2020-05-11 ENCOUNTER — Telehealth: Payer: Self-pay | Admitting: Physician Assistant

## 2020-05-11 MED ORDER — AZITHROMYCIN 250 MG PO TABS: ORAL_TABLET | ORAL | 0 refills | Status: DC

## 2020-05-11 NOTE — Telephone Encounter (Signed)
Medication has been sent to pharmacy. AS, CMA

## 2020-05-11 NOTE — Telephone Encounter (Signed)
Patient called in stating her Z-pack was not sent to [facility] yet. I do not see it listed in her med list.

## 2020-05-13 ENCOUNTER — Other Ambulatory Visit: Payer: Self-pay

## 2020-05-13 DIAGNOSIS — R188 Other ascites: Secondary | ICD-10-CM | POA: Diagnosis not present

## 2020-05-13 DIAGNOSIS — K7460 Unspecified cirrhosis of liver: Secondary | ICD-10-CM | POA: Diagnosis not present

## 2020-05-13 DIAGNOSIS — K7581 Nonalcoholic steatohepatitis (NASH): Secondary | ICD-10-CM | POA: Diagnosis not present

## 2020-05-14 NOTE — Patient Outreach (Signed)
[facility]) Care Management Chronic Special Needs Program  05/14/2020    Late entry for 05/13/20  Name: [name] DOB: 04/30/1934  MRN: [id number]   [name] is enrolled in a chronic special needs plan for Diabetes. Telephone call to client's daughter [name] for CSNP assessment follow up. HIPAA verified by daughter for client. Daughter states client is doing well.  She states client is currently in the process of changing her primary care provider due to her previous provider leaving the practice. Daughter reports client has a follow up appointment with her gastroenterologist later today.  Daughter reports client continues to monitor her blood sugars using her free style libre several times per day. She states client's blood sugars having been running a little high. She reports client's blood sugars range from 85 to 437.  Daughter reports client's doctor is aware. Daughter reports client had an ED visit on 03/24/20 due to a fall. No injury due to fall per daughter.   Reviewed and updated care plan.   Goals Addressed              This Visit's Progress   .  "write a new advance directive" (pt-stated)        Contact your RN case manager if you need further assistance with your Advance Directive.     .  Advanced Care Planning complete by 2021   On track     Contact your RN case manager if you need further assistance regarding your Advance Directive.     .  COMPLETED: Client understands the importance of follow-up with providers by attending scheduled visits   On track     Primary care provider telephone appointment 10/27/19 and 05/10/20.   Daughter reports clients Annual wellness visit is scheduled for September 2021. Cardiology visit 11/20/19 Continue to maintain and keep follow up visits with your providers.      .  Client verbalize knowledge of Heart Failure disease self management skills within 6 months   On track     Daughter verbalized knowledge of  heart failure symptoms for client  Continue to take your medications as prescribed.  Contact your doctor if you have questions / concerns.  RN case manager will send client education article: Heart failure and atrial fibrillation     .  Client will report no worsening of symptoms of Atrial Fibrillation within the next 4-5 months   On track     RN case manager will send client education article: Heart failure and atrial fibrillation.  Continue to take your medications as prescribed.  Follow up with your cardiologist as recommended.     .  client will report obtaining a new primary care provider in 3 months.   On track     Client recently had follow up visit on 05/10/20 by physicians assistance in current provider practice.  Daughter reports client awaiting new primary provider assignment.  Contact your health team advantage concierge for questions regarding participating plan primary care providers.  Contact your RN case manager if assistance is needed.     .  Client will verbalize knowledge of chronic lung disease as evidenced by no ED visits or Inpatient stays related to chronic lung disease    On track     Review [facility] team advantage calendar sent in the mail for COPD information and action plan.  Contact your provider for any signs and symptoms  of mild shortness of breath.  Call 911 for severe  symptoms.      .  Client will verbalize knowledge of self management of Hypertension as evidences by BP reading of 140/90 or less; or as defined by provider   On track     Blood pressure readings:  127/60 and 116/58 Continue to take your medications as prescribed.  Continue to adhere to a low salt diet.  RN case manager will send client education article: High blood pressure in adults.    .  Daughter will verbalize how client will manage hyperglycemic events in 6 months.   On track     RN case manager will send client education articles: High Blood sugar, Adult and How to prevent high blood  sugar emergencies in diabetes.  Managing hyperglycemia:  Take your medications as directed Follow your diabetes eating plan, Check your blood sugars regularly Notify your doctor of consistently high blood pressures for treatment advisement    .  Decrease inpatient diabetes admissions/readmissions with in the year   On track     Daughter denies client having hospital admission due to diabetes Continue diabetes self management actions:  Glucose monitoring per provider recommendation  Visit provider every 3-6 months as directed  Hbg A1C level every 3-6 months.  Carbohydrate controlled meal planning  Taking diabetes medication as prescribed by provider  Physical activity  Contact your doctor if you have questions/ concerns.     Marland Kitchen  HEMOGLOBIN A1C < 7        Daughter states client Continues to enjoy the ease of using her free style libre to monitor her blood sugars.  Hgb A1c on 03/09/20 was 8.3 Continue diabetes self management actions:  Glucose monitoring per provider recommendation  Visit provider every 3-6 months as directed  Hbg A1C level every 3-6 months.  Carbohydrate controlled meal planning  Taking diabetes medication as prescribed by provider  Physical activity     .  Maintain timely refills of diabetic medication as prescribed within the year .   On track     Contact your RN case manager if you have difficulty obtaining your medications. Daughter reports client maintains timely refills of her diabetic medications.  Daughter reports she assists client with medication management.  Medication review completed with daughter for client from medical record.  Continue to take your medication as prescribed.  Contact your doctor if you have questions regarding your medicine.     .  COMPLETED: Obtain Annual Eye (retinal)  Exam    On track     Diabetic eye exam completed 12/07/19 Continue to schedule your eye exam yearly     .  Obtain Annual Foot Exam   Not on track      Annual foot exam 03/12/18.   It is important that your doctor check your feet regularly. Discuss this with your doctor at your next visit.  Diabetes foot care - Check feet daily at home (look for skin color changes, cuts, sores or cracks in the skin, swelling of feet or ankles, ingrown or fungal toenails, corn or calluses). Report these findings to your doctor - Wash feet with soap and water, dry feet well especially between toes - Moisturize your feet but not between the toes - Always wear shoes that protect your whole feet.      .  Obtain annual screen for micro albuminuria (urine) , nephropathy (kidney problems)   Not on track     Urine tested for protein on 08/26/18 Discuss with your doctor need for yearly urine micro albuminuria  screening RN case manager will send client education article: Urine albumin    .  Obtain Hemoglobin A1C at least 2 times per year   On track     Hgb A1C assessed on  10/15/19 and 03/09/20 Continue to keep your follow up appointments with your provider and have lab work completed as recommended.      .  patient will achieve optimal functioning within limits of visual impairment as evidenced by ability to care for self, navigate enviornment safely, and engage in meaningful activity   On track     Continue to adhere to below recommendations: Remove environmental barriers to ensure safety( avoid leaving doors partially open, do not make unnecessary changes in the environment) Provide adequate lighting Placed frequently used items within the Clients range of vision or reach Use visual aids when appropriate ( magnifying glass, or large type printed books/ magazines. Encourage use of touch  Hold both arms of chair before sitting and to feel for the seat on chairs or sofas without arms.      .  Visit Primary Care Provider or Endocrinologist at least 2 times per year    On track     Primary care provider visit 10/27/19 and 05/10/20 Endocrinologist 11/23/19 Follow up with  gastroenterologist as recommended.  Complete your annual wellness visit every year, attend follow up visits and complete lab work as recommended by your providers.        Assessment:  Client continues follow up with her providers, daughter closely monitors and assists client with care. Client has not had any hospital admission in 6 months. Client had 1 ED visit in 6 months. [name] CNSP tier level will be changed to tier 2.   Plan:  Send successful outreach letter with a copy of their individualized care plan, Send individual care plan to provider and Send educational material  Chronic care management coordinator will outreach in:  6 Months     Quinn Plowman RN,BSN,CCM [place] Management [phone]     .

## 2020-05-14 NOTE — Patient Outreach (Deleted)
[facility]) Care Management   05/14/2020  [name] 1934/07/14 [id number]  [name] is an 86 y.o. female  Subjective:   Objective:   Review of Systems  Physical Exam  Encounter Medications:   Outpatient Encounter Medications as of 05/13/2020  Medication Sig Note  . albuterol (VENTOLIN HFA) 108 (90 Base) MCG/ACT inhaler INHALE 2 PUFFS BY MOUTH EVERY 6 HOURS AS NEEDED FOR WHEEZING OR SHORTNESS OF BREATH   . azithromycin (ZITHROMAX) 250 MG tablet Take 2 tablets by mouth on day 1, then take 1 tablet by mouth once daily x 4 days.   . brimonidine (ALPHAGAN) 0.2 % ophthalmic solution Place 1 drop into both eyes daily.   . Cholecalciferol (VITAMIN D3) 50 MCG (2000 UT) TABS Take by mouth daily.    Marland Kitchen diltiazem (CARDIZEM CD) 120 MG 24 hr capsule Take 1 capsule (120 mg total) by mouth daily.   Marland Kitchen escitalopram (LEXAPRO) 20 MG tablet Take 1 tablet (20 mg total) by mouth daily.   . furosemide (LASIX) 40 MG tablet Take 1 tablet by mouth in the morning daily.  You may take additional dose in the afternoon for edema/swelling 05/13/2020: Client can also increase to 2 x per day for weight gain (over 5 lbs.)  . insulin glargine (LANTUS) 100 UNIT/ML injection Inject 30 Units into the skin at bedtime.   . isosorbide mononitrate (IMDUR) 30 MG 24 hr tablet Take 0.5 tablets (15 mg total) by mouth daily.   Marland Kitchen levocetirizine (XYZAL) 5 MG tablet Take 1 tablet (5 mg total) by mouth every evening. During allergy season only   . levothyroxine (SYNTHROID) 100 MCG tablet Take 1 tablet (100 mcg total) by mouth daily.   . metoprolol tartrate (LOPRESSOR) 50 MG tablet Take 0.5 tablets (25 mg total) by mouth in the morning.   . montelukast (SINGULAIR) 10 MG tablet Take 1 tablet (10 mg total) by mouth every evening. **PATIENT NEEDS APT FOR FURTHER REFILLS**   . Multiple Vitamins-Minerals (VISION FORMULA/LUTEIN) TABS Take 1 tablet by mouth daily.   . nitroGLYCERIN (NITROSTAT) 0.4 MG SL tablet  PLACE 1 TABLET UNDER THE TONGUE EVERY 5 MINUTES AS NEEDED FOR CHEST PAIN   . pantoprazole (PROTONIX) 20 MG tablet Take 1 tablet (20 mg total) by mouth 2 (two) times daily. I po qam can add I po qpm prn   . RESTASIS 0.05 % ophthalmic emulsion Place 1 drop into both eyes 2 (two) times daily. 1 drop both eyes twice daily   . rifaximin (XIFAXAN) 550 MG TABS tablet Take 550 mg by mouth 2 (two) times daily.   Marland Kitchen spironolactone (ALDACTONE) 100 MG tablet Take 0.5 tablets (50 mg total) by mouth in the morning. 05/13/2020: Client takes 1 tablet daily  . ursodiol (ACTIGALL) 500 MG tablet Take 500 mg by mouth in the morning and at bedtime.   . Blood Glucose Monitoring Suppl (FREESTYLE LITE) DEVI Use to check blood sugars 2-3 times daily   . Continuous Blood Gluc Receiver (FREESTYLE LIBRE 14 DAY READER) DEVI USE TO MONITOR BLOOD SUGAR 3 TIMES DAILY   . Continuous Blood Gluc Sensor ([place]) MISC Use to monitor blood sugars TID   . Insulin Aspart FlexPen 100 UNIT/ML SOPN INJECT 20 UNITS SUBCUTANEOUSLY THREE TIMES A DAY BEFORE MEALS; ADJUST AND INCREASE ACCORDINGLY. 05/13/2020: Daughter states she does 20-30 units depending on blood sugars 300 or above  . Lancets (ONETOUCH DELICA PLUS [id number]) MISC USE TO CHECK BLOOD SUGAR THREE TIMES A DAY   .  UNIFINE PENTIPS 31G X 5 MM MISC USE TO INJECT INSULIN 5 TIMES A DAY    No facility-administered encounter medications on file as of 05/13/2020.    Functional Status:   In your present state of health, do you have any difficulty performing the following activities: 05/13/2020 05/10/2020  Hearing? Y Y  Comment - -  Vision? Y Y  Difficulty concentrating or making decisions? Y Y  Comment - -  Walking or climbing stairs? N N  Dressing or bathing? N N  Doing errands, shopping? Tempie Donning  Preparing Food and eating ? N -  Using the Toilet? N -  In the past six months, have you accidently leaked urine? Y -  Do you have problems with loss of bowel control? Y -   Managing your Medications? Y -  Comment - -  Managing your Finances? Y -  Housekeeping or managing your Housekeeping? Y -  Some recent data might be hidden    Fall/Depression Screening:    Fall Risk  05/10/2020 11/25/2019 08/26/2018  Falls in the past year? 1 0 0  Number falls in past yr: 0 0 -  Injury with Fall? 1 0 -  Risk for fall due to : - History of fall(s);Impaired balance/gait;Impaired mobility -  Follow up Falls evaluation completed Education provided;Falls prevention discussed -   PHQ 2/9 Scores 05/10/2020 11/25/2019 10/27/2019 08/19/2019 05/05/2019 03/09/2019 12/31/2018  PHQ - 2 Score 0 1 3 0 3 0 0  PHQ- 9 Score 0 - 8 1 5  - 4    Assessment:    Plan:

## 2020-05-18 ENCOUNTER — Encounter: Payer: Self-pay | Admitting: Physician Assistant

## 2020-05-18 DIAGNOSIS — K219 Gastro-esophageal reflux disease without esophagitis: Secondary | ICD-10-CM

## 2020-05-18 DIAGNOSIS — E039 Hypothyroidism, unspecified: Secondary | ICD-10-CM

## 2020-05-18 DIAGNOSIS — F32A Depression, unspecified: Secondary | ICD-10-CM

## 2020-05-19 MED ORDER — METOPROLOL TARTRATE 50 MG PO TABS: 25.0000 mg | ORAL_TABLET | Freq: Every morning | ORAL | 0 refills | Status: DC

## 2020-05-19 MED ORDER — LEVOTHYROXINE SODIUM 100 MCG PO TABS: 100.0000 ug | ORAL_TABLET | Freq: Every day | ORAL | 0 refills | Status: DC

## 2020-05-19 MED ORDER — INSULIN GLARGINE 100 UNIT/ML ~~LOC~~ SOLN: 30.0000 [IU] | Freq: Every day | SUBCUTANEOUS | 2 refills | Status: AC

## 2020-05-19 MED ORDER — ISOSORBIDE MONONITRATE ER 30 MG PO TB24: 15.0000 mg | ORAL_TABLET | Freq: Every day | ORAL | 1 refills | Status: DC

## 2020-05-19 MED ORDER — DILTIAZEM HCL ER COATED BEADS 120 MG PO CP24: 120.0000 mg | ORAL_CAPSULE | Freq: Every day | ORAL | 1 refills | Status: AC

## 2020-05-19 MED ORDER — ESCITALOPRAM OXALATE 20 MG PO TABS: 20.0000 mg | ORAL_TABLET | Freq: Every day | ORAL | 0 refills | Status: AC

## 2020-05-19 MED ORDER — PANTOPRAZOLE SODIUM 20 MG PO TBEC: 20.0000 mg | DELAYED_RELEASE_TABLET | Freq: Two times a day (BID) | ORAL | 0 refills | Status: AC

## 2020-05-19 MED ORDER — LEVOTHYROXINE SODIUM 100 MCG PO TABS: 100.0000 ug | ORAL_TABLET | Freq: Every day | ORAL | 0 refills | Status: AC

## 2020-05-19 MED ORDER — MONTELUKAST SODIUM 10 MG PO TABS: 10.0000 mg | ORAL_TABLET | Freq: Every evening | ORAL | 0 refills | Status: AC

## 2020-05-19 NOTE — Addendum Note (Signed)
Addended by: Sylvester Harder on: 05/19/2020 08:35 AM   Modules accepted: Orders

## 2020-06-15 ENCOUNTER — Telehealth: Payer: Self-pay | Admitting: Physician Assistant

## 2020-06-15 NOTE — Telephone Encounter (Signed)
Patient's home health certification was faxed over and it was cut off. I am not aware of this. Order numbers are [id number] and [id number]. Thanks

## 2020-06-15 NOTE — Telephone Encounter (Signed)
No record of these forms scanned in chart.  Please call home health back and ask them to fax to [place] again for completion.  Tiajuana Amass, CMA

## 2020-06-30 ENCOUNTER — Other Ambulatory Visit: Payer: Self-pay | Admitting: Family Medicine

## 2020-07-04 DIAGNOSIS — E7800 Pure hypercholesterolemia, unspecified: Secondary | ICD-10-CM | POA: Diagnosis not present

## 2020-07-04 DIAGNOSIS — E039 Hypothyroidism, unspecified: Secondary | ICD-10-CM | POA: Diagnosis not present

## 2020-07-04 DIAGNOSIS — E1165 Type 2 diabetes mellitus with hyperglycemia: Secondary | ICD-10-CM | POA: Diagnosis not present

## 2020-07-04 DIAGNOSIS — M8580 Other specified disorders of bone density and structure, unspecified site: Secondary | ICD-10-CM | POA: Diagnosis not present

## 2020-07-04 DIAGNOSIS — I10 Essential (primary) hypertension: Secondary | ICD-10-CM | POA: Diagnosis not present

## 2020-07-04 DIAGNOSIS — K7460 Unspecified cirrhosis of liver: Secondary | ICD-10-CM | POA: Diagnosis not present

## 2020-07-13 ENCOUNTER — Inpatient Hospital Stay (HOSPITAL_COMMUNITY): Payer: HMO

## 2020-07-13 ENCOUNTER — Inpatient Hospital Stay (HOSPITAL_COMMUNITY)
Admission: EM | Admit: 2020-07-13 | Discharge: 2020-08-20 | DRG: 405 | Disposition: E | Payer: HMO | Attending: Pulmonary Disease | Admitting: Pulmonary Disease

## 2020-07-13 ENCOUNTER — Encounter (HOSPITAL_COMMUNITY): Admission: EM | Disposition: E | Payer: Self-pay | Source: Home / Self Care | Attending: Internal Medicine

## 2020-07-13 ENCOUNTER — Encounter (HOSPITAL_COMMUNITY): Payer: Self-pay

## 2020-07-13 DIAGNOSIS — E039 Hypothyroidism, unspecified: Secondary | ICD-10-CM | POA: Diagnosis not present

## 2020-07-13 DIAGNOSIS — R42 Dizziness and giddiness: Secondary | ICD-10-CM | POA: Diagnosis not present

## 2020-07-13 DIAGNOSIS — R578 Other shock: Secondary | ICD-10-CM | POA: Diagnosis not present

## 2020-07-13 DIAGNOSIS — R188 Other ascites: Secondary | ICD-10-CM | POA: Diagnosis not present

## 2020-07-13 DIAGNOSIS — Z515 Encounter for palliative care: Secondary | ICD-10-CM | POA: Diagnosis not present

## 2020-07-13 DIAGNOSIS — E871 Hypo-osmolality and hyponatremia: Secondary | ICD-10-CM | POA: Diagnosis present

## 2020-07-13 DIAGNOSIS — E870 Hyperosmolality and hypernatremia: Secondary | ICD-10-CM | POA: Diagnosis not present

## 2020-07-13 DIAGNOSIS — Z808 Family history of malignant neoplasm of other organs or systems: Secondary | ICD-10-CM

## 2020-07-13 DIAGNOSIS — J42 Unspecified chronic bronchitis: Secondary | ICD-10-CM | POA: Diagnosis not present

## 2020-07-13 DIAGNOSIS — Z794 Long term (current) use of insulin: Secondary | ICD-10-CM

## 2020-07-13 DIAGNOSIS — D62 Acute posthemorrhagic anemia: Secondary | ICD-10-CM | POA: Diagnosis present

## 2020-07-13 DIAGNOSIS — I8511 Secondary esophageal varices with bleeding: Secondary | ICD-10-CM | POA: Diagnosis not present

## 2020-07-13 DIAGNOSIS — N179 Acute kidney failure, unspecified: Secondary | ICD-10-CM | POA: Diagnosis present

## 2020-07-13 DIAGNOSIS — Z833 Family history of diabetes mellitus: Secondary | ICD-10-CM

## 2020-07-13 DIAGNOSIS — K766 Portal hypertension: Secondary | ICD-10-CM | POA: Diagnosis not present

## 2020-07-13 DIAGNOSIS — Z955 Presence of coronary angioplasty implant and graft: Secondary | ICD-10-CM

## 2020-07-13 DIAGNOSIS — I10 Essential (primary) hypertension: Secondary | ICD-10-CM | POA: Diagnosis not present

## 2020-07-13 DIAGNOSIS — J9690 Respiratory failure, unspecified, unspecified whether with hypoxia or hypercapnia: Secondary | ICD-10-CM | POA: Diagnosis not present

## 2020-07-13 DIAGNOSIS — R161 Splenomegaly, not elsewhere classified: Secondary | ICD-10-CM | POA: Diagnosis not present

## 2020-07-13 DIAGNOSIS — K254 Chronic or unspecified gastric ulcer with hemorrhage: Secondary | ICD-10-CM | POA: Diagnosis not present

## 2020-07-13 DIAGNOSIS — K7460 Unspecified cirrhosis of liver: Secondary | ICD-10-CM | POA: Diagnosis present

## 2020-07-13 DIAGNOSIS — Z880 Allergy status to penicillin: Secondary | ICD-10-CM

## 2020-07-13 DIAGNOSIS — K922 Gastrointestinal hemorrhage, unspecified: Secondary | ICD-10-CM | POA: Diagnosis not present

## 2020-07-13 DIAGNOSIS — R0902 Hypoxemia: Secondary | ICD-10-CM | POA: Diagnosis not present

## 2020-07-13 DIAGNOSIS — Z66 Do not resuscitate: Secondary | ICD-10-CM | POA: Diagnosis not present

## 2020-07-13 DIAGNOSIS — Z6832 Body mass index (BMI) 32.0-32.9, adult: Secondary | ICD-10-CM

## 2020-07-13 DIAGNOSIS — I252 Old myocardial infarction: Secondary | ICD-10-CM

## 2020-07-13 DIAGNOSIS — I110 Hypertensive heart disease with heart failure: Secondary | ICD-10-CM | POA: Diagnosis not present

## 2020-07-13 DIAGNOSIS — I864 Gastric varices: Secondary | ICD-10-CM | POA: Diagnosis present

## 2020-07-13 DIAGNOSIS — J849 Interstitial pulmonary disease, unspecified: Secondary | ICD-10-CM | POA: Diagnosis present

## 2020-07-13 DIAGNOSIS — K7290 Hepatic failure, unspecified without coma: Secondary | ICD-10-CM | POA: Diagnosis present

## 2020-07-13 DIAGNOSIS — E7220 Disorder of urea cycle metabolism, unspecified: Secondary | ICD-10-CM | POA: Diagnosis not present

## 2020-07-13 DIAGNOSIS — Z4659 Encounter for fitting and adjustment of other gastrointestinal appliance and device: Secondary | ICD-10-CM

## 2020-07-13 DIAGNOSIS — Z7189 Other specified counseling: Secondary | ICD-10-CM | POA: Diagnosis not present

## 2020-07-13 DIAGNOSIS — H548 Legal blindness, as defined in USA: Secondary | ICD-10-CM | POA: Diagnosis present

## 2020-07-13 DIAGNOSIS — R579 Shock, unspecified: Secondary | ICD-10-CM | POA: Diagnosis not present

## 2020-07-13 DIAGNOSIS — E1165 Type 2 diabetes mellitus with hyperglycemia: Secondary | ICD-10-CM | POA: Diagnosis not present

## 2020-07-13 DIAGNOSIS — I5033 Acute on chronic diastolic (congestive) heart failure: Secondary | ICD-10-CM | POA: Diagnosis not present

## 2020-07-13 DIAGNOSIS — I959 Hypotension, unspecified: Secondary | ICD-10-CM | POA: Diagnosis not present

## 2020-07-13 DIAGNOSIS — E785 Hyperlipidemia, unspecified: Secondary | ICD-10-CM | POA: Diagnosis present

## 2020-07-13 DIAGNOSIS — R4182 Altered mental status, unspecified: Secondary | ICD-10-CM | POA: Diagnosis not present

## 2020-07-13 DIAGNOSIS — K7469 Other cirrhosis of liver: Secondary | ICD-10-CM | POA: Diagnosis not present

## 2020-07-13 DIAGNOSIS — I4891 Unspecified atrial fibrillation: Secondary | ICD-10-CM | POA: Diagnosis not present

## 2020-07-13 DIAGNOSIS — Z20822 Contact with and (suspected) exposure to covid-19: Secondary | ICD-10-CM | POA: Diagnosis present

## 2020-07-13 DIAGNOSIS — J9601 Acute respiratory failure with hypoxia: Secondary | ICD-10-CM | POA: Diagnosis not present

## 2020-07-13 DIAGNOSIS — I700 Atherosclerosis of aorta: Secondary | ICD-10-CM | POA: Diagnosis not present

## 2020-07-13 DIAGNOSIS — Z79890 Hormone replacement therapy (postmenopausal): Secondary | ICD-10-CM

## 2020-07-13 DIAGNOSIS — Z882 Allergy status to sulfonamides status: Secondary | ICD-10-CM

## 2020-07-13 DIAGNOSIS — E119 Type 2 diabetes mellitus without complications: Secondary | ICD-10-CM | POA: Diagnosis not present

## 2020-07-13 DIAGNOSIS — E1169 Type 2 diabetes mellitus with other specified complication: Secondary | ICD-10-CM | POA: Diagnosis not present

## 2020-07-13 DIAGNOSIS — K920 Hematemesis: Secondary | ICD-10-CM | POA: Diagnosis not present

## 2020-07-13 DIAGNOSIS — Z8719 Personal history of other diseases of the digestive system: Secondary | ICD-10-CM

## 2020-07-13 DIAGNOSIS — I5032 Chronic diastolic (congestive) heart failure: Secondary | ICD-10-CM | POA: Diagnosis present

## 2020-07-13 DIAGNOSIS — R55 Syncope and collapse: Secondary | ICD-10-CM | POA: Diagnosis not present

## 2020-07-13 DIAGNOSIS — I2510 Atherosclerotic heart disease of native coronary artery without angina pectoris: Secondary | ICD-10-CM | POA: Diagnosis not present

## 2020-07-13 DIAGNOSIS — Z781 Physical restraint status: Secondary | ICD-10-CM

## 2020-07-13 DIAGNOSIS — Z9289 Personal history of other medical treatment: Secondary | ICD-10-CM

## 2020-07-13 DIAGNOSIS — K317 Polyp of stomach and duodenum: Secondary | ICD-10-CM | POA: Diagnosis not present

## 2020-07-13 DIAGNOSIS — I8501 Esophageal varices with bleeding: Secondary | ICD-10-CM

## 2020-07-13 DIAGNOSIS — E873 Alkalosis: Secondary | ICD-10-CM | POA: Diagnosis present

## 2020-07-13 DIAGNOSIS — Z83438 Family history of other disorder of lipoprotein metabolism and other lipidemia: Secondary | ICD-10-CM

## 2020-07-13 DIAGNOSIS — Z01818 Encounter for other preprocedural examination: Secondary | ICD-10-CM | POA: Diagnosis not present

## 2020-07-13 DIAGNOSIS — R110 Nausea: Secondary | ICD-10-CM | POA: Diagnosis not present

## 2020-07-13 DIAGNOSIS — I152 Hypertension secondary to endocrine disorders: Secondary | ICD-10-CM | POA: Diagnosis present

## 2020-07-13 DIAGNOSIS — E1159 Type 2 diabetes mellitus with other circulatory complications: Secondary | ICD-10-CM | POA: Diagnosis present

## 2020-07-13 DIAGNOSIS — Z79899 Other long term (current) drug therapy: Secondary | ICD-10-CM

## 2020-07-13 DIAGNOSIS — K7581 Nonalcoholic steatohepatitis (NASH): Secondary | ICD-10-CM | POA: Diagnosis present

## 2020-07-13 DIAGNOSIS — I480 Paroxysmal atrial fibrillation: Secondary | ICD-10-CM | POA: Diagnosis present

## 2020-07-13 DIAGNOSIS — D696 Thrombocytopenia, unspecified: Secondary | ICD-10-CM | POA: Diagnosis present

## 2020-07-13 DIAGNOSIS — I491 Atrial premature depolarization: Secondary | ICD-10-CM | POA: Diagnosis not present

## 2020-07-13 DIAGNOSIS — K921 Melena: Secondary | ICD-10-CM | POA: Diagnosis not present

## 2020-07-13 DIAGNOSIS — Z825 Family history of asthma and other chronic lower respiratory diseases: Secondary | ICD-10-CM

## 2020-07-13 DIAGNOSIS — G4733 Obstructive sleep apnea (adult) (pediatric): Secondary | ICD-10-CM | POA: Diagnosis not present

## 2020-07-13 DIAGNOSIS — K274 Chronic or unspecified peptic ulcer, site unspecified, with hemorrhage: Secondary | ICD-10-CM | POA: Diagnosis not present

## 2020-07-13 DIAGNOSIS — Z978 Presence of other specified devices: Secondary | ICD-10-CM

## 2020-07-13 DIAGNOSIS — Z8249 Family history of ischemic heart disease and other diseases of the circulatory system: Secondary | ICD-10-CM

## 2020-07-13 DIAGNOSIS — K219 Gastro-esophageal reflux disease without esophagitis: Secondary | ICD-10-CM | POA: Diagnosis not present

## 2020-07-13 DIAGNOSIS — J449 Chronic obstructive pulmonary disease, unspecified: Secondary | ICD-10-CM | POA: Diagnosis present

## 2020-07-13 DIAGNOSIS — H9190 Unspecified hearing loss, unspecified ear: Secondary | ICD-10-CM | POA: Diagnosis present

## 2020-07-13 DIAGNOSIS — Z4682 Encounter for fitting and adjustment of non-vascular catheter: Secondary | ICD-10-CM | POA: Diagnosis not present

## 2020-07-13 DIAGNOSIS — Z87891 Personal history of nicotine dependence: Secondary | ICD-10-CM

## 2020-07-13 DIAGNOSIS — J90 Pleural effusion, not elsewhere classified: Secondary | ICD-10-CM | POA: Diagnosis not present

## 2020-07-13 HISTORY — PX: ESOPHAGOGASTRODUODENOSCOPY (EGD) WITH PROPOFOL: SHX5813

## 2020-07-13 LAB — CBC WITH DIFFERENTIAL/PLATELET
Abs Immature Granulocytes: 0.08 10*3/uL — ABNORMAL HIGH (ref 0.00–0.07)
Basophils Absolute: 0.1 10*3/uL (ref 0.0–0.1)
Basophils Relative: 1 %
Eosinophils Absolute: 0.2 10*3/uL (ref 0.0–0.5)
Eosinophils Relative: 1 %
HCT: 35.7 % — ABNORMAL LOW (ref 36.0–46.0)
Hemoglobin: 11.4 g/dL — ABNORMAL LOW (ref 12.0–15.0)
Immature Granulocytes: 1 %
Lymphocytes Relative: 9 %
Lymphs Abs: 1 10*3/uL (ref 0.7–4.0)
MCH: 29.5 pg (ref 26.0–34.0)
MCHC: 31.9 g/dL (ref 30.0–36.0)
MCV: 92.5 fL (ref 80.0–100.0)
Monocytes Absolute: 1.1 10*3/uL — ABNORMAL HIGH (ref 0.1–1.0)
Monocytes Relative: 10 %
Neutro Abs: 8.8 10*3/uL — ABNORMAL HIGH (ref 1.7–7.7)
Neutrophils Relative %: 78 %
Platelets: 100 10*3/uL — ABNORMAL LOW (ref 150–400)
RBC: 3.86 MIL/uL — ABNORMAL LOW (ref 3.87–5.11)
RDW: 14.5 % (ref 11.5–15.5)
WBC: 11.2 10*3/uL — ABNORMAL HIGH (ref 4.0–10.5)
nRBC: 0 % (ref 0.0–0.2)

## 2020-07-13 LAB — RESP PANEL BY RT-PCR (FLU A&B, COVID) ARPGX2
Influenza A by PCR: NEGATIVE
Influenza B by PCR: NEGATIVE
SARS Coronavirus 2 by RT PCR: NEGATIVE

## 2020-07-13 LAB — PROTIME-INR
INR: 1.2 (ref 0.8–1.2)
Prothrombin Time: 14.6 seconds (ref 11.4–15.2)

## 2020-07-13 LAB — COMPREHENSIVE METABOLIC PANEL
ALT: 19 U/L (ref 0–44)
AST: 26 U/L (ref 15–41)
Albumin: 2.7 g/dL — ABNORMAL LOW (ref 3.5–5.0)
Alkaline Phosphatase: 129 U/L — ABNORMAL HIGH (ref 38–126)
Anion gap: 13 (ref 5–15)
BUN: 28 mg/dL — ABNORMAL HIGH (ref 8–23)
CO2: 19 mmol/L — ABNORMAL LOW (ref 22–32)
Calcium: 8.4 mg/dL — ABNORMAL LOW (ref 8.9–10.3)
Chloride: 98 mmol/L (ref 98–111)
Creatinine, Ser: 0.93 mg/dL (ref 0.44–1.00)
GFR, Estimated: >60 mL/min (ref >60)
Glucose, Bld: 183 mg/dL — ABNORMAL HIGH (ref 70–99)
Potassium: 3.8 mmol/L (ref 3.5–5.1)
Sodium: 130 mmol/L — ABNORMAL LOW (ref 135–145)
Total Bilirubin: 1 mg/dL (ref 0.3–1.2)
Total Protein: 5.5 g/dL — ABNORMAL LOW (ref 6.5–8.1)

## 2020-07-13 LAB — CBG MONITORING, ED: Glucose-Capillary: 104 mg/dL — ABNORMAL HIGH (ref 70–99)

## 2020-07-13 LAB — FOLATE: Folate: 13.5 ng/mL (ref >5.9)

## 2020-07-13 LAB — RETICULOCYTES
Immature Retic Fract: 13.7 % (ref 2.3–15.9)
RBC.: 3.84 MIL/uL — ABNORMAL LOW (ref 3.87–5.11)
Retic Count, Absolute: 94.5 10*3/uL (ref 19.0–186.0)
Retic Ct Pct: 2.5 % (ref 0.4–3.1)

## 2020-07-13 LAB — IRON AND TIBC
Iron: 86 ug/dL (ref 28–170)
Saturation Ratios: 30 % (ref 10.4–31.8)
TIBC: 283 ug/dL (ref 250–450)
UIBC: 197 ug/dL

## 2020-07-13 LAB — PREPARE RBC (CROSSMATCH)

## 2020-07-13 LAB — VITAMIN B12: Vitamin B-12: 188 pg/mL (ref 180–914)

## 2020-07-13 LAB — FERRITIN: Ferritin: 48 ng/mL (ref 11–307)

## 2020-07-13 LAB — APTT: aPTT: 34 seconds (ref 24–36)

## 2020-07-13 SURGERY — ESOPHAGOGASTRODUODENOSCOPY (EGD) WITH PROPOFOL
Anesthesia: Monitor Anesthesia Care

## 2020-07-13 MED ORDER — SUCCINYLCHOLINE CHLORIDE 200 MG/10ML IV SOSY
PREFILLED_SYRINGE | INTRAVENOUS | Status: AC
  Filled 2020-07-13: qty 10

## 2020-07-13 MED ORDER — SODIUM CHLORIDE 0.9 % IV SOLN
50.0000 ug/h | INTRAVENOUS | Status: DC
  Administered 2020-07-13 – 2020-07-15 (×3): 50 ug/h via INTRAVENOUS
  Filled 2020-07-13 (×5): qty 1

## 2020-07-13 MED ORDER — PANTOPRAZOLE SODIUM 40 MG IV SOLR
40.0000 mg | Freq: Two times a day (BID) | INTRAVENOUS | Status: DC
  Administered 2020-07-17 – 2020-07-19 (×6): 40 mg via INTRAVENOUS
  Filled 2020-07-13 (×6): qty 40

## 2020-07-13 MED ORDER — PANTOPRAZOLE SODIUM 40 MG IV SOLR
40.0000 mg | INTRAVENOUS | Status: AC
  Administered 2020-07-13: 40 mg via INTRAVENOUS
  Filled 2020-07-13: qty 40

## 2020-07-13 MED ORDER — FENTANYL CITRATE (PF) 100 MCG/2ML IJ SOLN
100.0000 ug | Freq: Once | INTRAMUSCULAR | Status: DC
  Filled 2020-07-13: qty 2

## 2020-07-13 MED ORDER — SODIUM CHLORIDE 0.9 % IV SOLN
8.0000 mg/h | INTRAVENOUS | Status: AC
  Administered 2020-07-14 – 2020-07-16 (×7): 8 mg/h via INTRAVENOUS
  Filled 2020-07-13 (×8): qty 80

## 2020-07-13 MED ORDER — SODIUM CHLORIDE 0.9% IV SOLUTION: Freq: Once | INTRAVENOUS | Status: DC

## 2020-07-13 MED ORDER — FENTANYL CITRATE (PF) 100 MCG/2ML IJ SOLN
INTRAMUSCULAR | Status: AC
  Filled 2020-07-13: qty 2

## 2020-07-13 MED ORDER — SODIUM CHLORIDE 0.9 % IV SOLN: INTRAVENOUS | Status: DC

## 2020-07-13 MED ORDER — SODIUM CHLORIDE 0.9% FLUSH
3.0000 mL | Freq: Two times a day (BID) | INTRAVENOUS | Status: DC
  Administered 2020-07-14 – 2020-07-18 (×4): 3 mL via INTRAVENOUS
  Administered 2020-07-18: 10 mL via INTRAVENOUS
  Administered 2020-07-19: 3 mL via INTRAVENOUS

## 2020-07-13 MED ORDER — PROPOFOL 1000 MG/100ML IV EMUL: 5.0000 ug/kg/min | INTRAVENOUS | Status: DC

## 2020-07-13 MED ORDER — ROCURONIUM BROMIDE 10 MG/ML (PF) SYRINGE
PREFILLED_SYRINGE | INTRAVENOUS | Status: AC
  Filled 2020-07-13: qty 10

## 2020-07-13 MED ORDER — SODIUM CHLORIDE 0.9 % IV SOLN
1.0000 g | INTRAVENOUS | Status: DC
  Filled 2020-07-13: qty 10

## 2020-07-13 MED ORDER — FENTANYL CITRATE (PF) 100 MCG/2ML IJ SOLN
INTRAMUSCULAR | Status: AC
  Filled 2020-07-13: qty 4

## 2020-07-13 MED ORDER — FAMOTIDINE IN NACL 20-0.9 MG/50ML-% IV SOLN
20.0000 mg | INTRAVENOUS | Status: AC
  Administered 2020-07-13: 20 mg via INTRAVENOUS
  Filled 2020-07-13: qty 50

## 2020-07-13 MED ORDER — ONDANSETRON HCL 4 MG PO TABS: 4.0000 mg | ORAL_TABLET | Freq: Four times a day (QID) | ORAL | Status: DC | PRN

## 2020-07-13 MED ORDER — NOREPINEPHRINE 4 MG/250ML-% IV SOLN
INTRAVENOUS | Status: AC
  Administered 2020-07-13: 2 ug/min via INTRAVENOUS
  Filled 2020-07-13: qty 250

## 2020-07-13 MED ORDER — MIDAZOLAM HCL 2 MG/2ML IJ SOLN
INTRAMUSCULAR | Status: AC
  Filled 2020-07-13: qty 4

## 2020-07-13 MED ORDER — ONDANSETRON HCL 4 MG/2ML IJ SOLN
4.0000 mg | Freq: Once | INTRAMUSCULAR | Status: AC
  Administered 2020-07-13: 4 mg via INTRAVENOUS
  Filled 2020-07-13: qty 2

## 2020-07-13 MED ORDER — INSULIN ASPART 100 UNIT/ML ~~LOC~~ SOLN: 0.0000 [IU] | Freq: Every day | SUBCUTANEOUS | Status: DC

## 2020-07-13 MED ORDER — PROPOFOL 1000 MG/100ML IV EMUL
INTRAVENOUS | Status: AC
  Filled 2020-07-13: qty 100

## 2020-07-13 MED ORDER — POLYETHYLENE GLYCOL 3350 17 G PO PACK: 17.0000 g | PACK | Freq: Every day | ORAL | Status: DC | PRN

## 2020-07-13 MED ORDER — OCTREOTIDE LOAD VIA INFUSION
50.0000 ug | Freq: Once | INTRAVENOUS | Status: AC
  Administered 2020-07-13: 50 ug via INTRAVENOUS
  Filled 2020-07-13: qty 25

## 2020-07-13 MED ORDER — MIDAZOLAM HCL (PF) 5 MG/ML IJ SOLN
INTRAMUSCULAR | Status: AC
  Filled 2020-07-13: qty 2

## 2020-07-13 MED ORDER — SODIUM CHLORIDE 0.9 % IV SOLN
80.0000 mg | Freq: Once | INTRAVENOUS | Status: DC
  Filled 2020-07-13: qty 80

## 2020-07-13 MED ORDER — ONDANSETRON HCL 4 MG/2ML IJ SOLN
4.0000 mg | Freq: Four times a day (QID) | INTRAMUSCULAR | Status: DC | PRN
  Filled 2020-07-13: qty 2

## 2020-07-13 MED ORDER — PROPOFOL 10 MG/ML IV BOLUS
1.0000 mg/kg | Freq: Once | INTRAVENOUS | Status: DC
  Filled 2020-07-13: qty 20

## 2020-07-13 MED ORDER — SODIUM CHLORIDE 0.9 % IV SOLN: 1.0000 g | Freq: Once | INTRAVENOUS | Status: DC

## 2020-07-13 MED ORDER — LACTATED RINGERS IV SOLN: INTRAVENOUS | Status: DC

## 2020-07-13 MED ORDER — MIDAZOLAM HCL 2 MG/2ML IJ SOLN
4.0000 mg | Freq: Once | INTRAMUSCULAR | Status: AC
  Administered 2020-07-14: 2 mg via INTRAVENOUS
  Filled 2020-07-13: qty 4

## 2020-07-13 MED ORDER — NOREPINEPHRINE 4 MG/250ML-% IV SOLN
0.0000 ug/min | INTRAVENOUS | Status: DC
  Administered 2020-07-14: 22 ug/min via INTRAVENOUS
  Filled 2020-07-13: qty 250

## 2020-07-13 MED ORDER — ETOMIDATE 2 MG/ML IV SOLN
INTRAVENOUS | Status: AC
  Filled 2020-07-13: qty 20

## 2020-07-13 MED ORDER — INSULIN ASPART 100 UNIT/ML ~~LOC~~ SOLN: 0.0000 [IU] | Freq: Three times a day (TID) | SUBCUTANEOUS | Status: DC

## 2020-07-13 MED ORDER — DOCUSATE SODIUM 100 MG PO CAPS: 100.0000 mg | ORAL_CAPSULE | Freq: Two times a day (BID) | ORAL | Status: DC | PRN

## 2020-07-13 SURGICAL SUPPLY — 15 items

## 2020-07-13 NOTE — ED Notes (Signed)
Patients bp 95K systolic. Provider made aware. Will continue to monitor the patient at this time.

## 2020-07-13 NOTE — H&P (Signed)
NAME:  [name], MRN:  [id number], DOB:  [birth year], LOS: 0 ADMISSION DATE:  06/24/2020, CONSULTATION DATE:  07/07/2020 REFERRING MD: EDP  , CHIEF COMPLAINT:  GIB   Brief History   86 year old female with a past medical history of [name] cirrhosis and esophageal varices COPD, CAD diabetes hyperlipidemia presented with syncope and hematemesis.  She became increasingly hypotensive with repeated hematemesis, so was intubated and plan for emergent EGD and transfusion.  History of present illness   [name] is an 86 year old female with a past medical history of [name] cirrhosis and esophageal varices COPD, CAD,  Diabetes,  hyperlipidemia who was in her usual state of health until she went to the bathroom and became lightheaded and syncopized.  When she woke up she began vomiting bright red blood.  EMS found patient hypotensive and weak.  She states she has recently had dark stools, but takes iron so this is normal for her.  Patient's last EGD was in 2016 and was diagnosed with grade 2 esophageal varices and portal hypertensive gastropathy.  She is not on aspirin, blood thinners or PPIs.  He is not sure if she is taking any OTC NSAIDs.   In the ED, patient's blood pressure initially improved with 500 cc bolus.  Hemoglobin was 11.4, this down trended to 9.7.   She also had several episodes of large-volume hematemesis and worsening hypotension so was intubated and started on Levophed.  PCCM consulted for admission and gastroenterology present to perform emergent EGD.   Past Medical History   has a past medical history of Cirrhosis, non-alcoholic ([place]), COPD (chronic obstructive pulmonary disease) ([place]), Coronary artery disease, Diabetes mellitus, Dyslipidemia, Fall, Hypotension (08/02/2010), Hypothyroidism, Leg edema, Legally blind, Myocardial infarct, old, Neuropathy of hand, Obesity, and Sleep apnea.   Significant Hospital Events   11/25 Admit to PCCM  Consults:  Gastroenterology  Procedures:   11/25 CVC by0 EDP 11/25 ETT  Significant Diagnostic Tests:  11/24 CXR>> lungs clear and ETT in good position  Micro Data:  11/24 Covid-19 and flu>> negative  Antimicrobials:  Ceftriaxone 11/25-   Interim history/subjective:  As above  Objective   Blood pressure (!) 89/78, pulse 94, resp. rate (!) 21, weight 71.2 kg, SpO2 100 %.       No intake or output data in the 24 hours ending 07/09/2020 2355 Filed Weights   06/27/2020 2300  Weight: 71.2 kg   General: Elderly female, intubated and sedated HEENT: MM pink/moist, copious bright red blood suctioned, ETT in place Neuro: Awake and responsive prior to intubation, examined while unresponsive after RSI medications and then propofol CV: s1s2 tachycardic, regular rhythm, no m/r/g PULM: Mechanical vent sounds, no rhonchi or wheezing GI: soft, bsx4 active  Extremities: warm/dry, no edema  Skin: no rashes or lesions   Resolved Hospital Problem list     Assessment & Plan:   Upper GI bleed with hemorrhagic shock in the setting of known Nash cirrhosis and esophageal varices and chronic thrombocytopenia Initial hemoglobin 11 downtrending to 9 prior to transfusion, not on any antiplatelet therapy or anticoagulation P: -Emergent EGD tonight, receiving initial 2 units PRBCs -Transfuse as needed to maintain hemoglobin greater than 8, platelets initially 100 K, may need platelet transfusion, monitor serial CBC -Octreotide and Protonix gtt -Continue Levophed to maintain MAP greater than 65 -Start ceftriaxone for SBP prophylaxis -resume home Rifaximin and Aldactone once patient stabilized   AKI Creatinine 1.3, baseline ~1.0, likely secondary to hypoperfusion P: -Blood pressure support in the setting of  shock, monitor UOP, renal indices and avoid nephrotoxins     History CAD, atrial fibrillation, HTN  last Echo 5 months ago with EF 55-60%  P: -Monitor for RVR and volume overload -Holding Cardizem and Lasix in the setting of  hypotension   Hypothyroidism -Continue Synthroid IV while patient is NPO   Type 2 DM -SSI, hold home Lantus for now  Best practice (evaluated daily)   Diet: NPO Pain/Anxiety/Delirium protocol (if indicated): Fentanyl, propofol VAP protocol (if indicated): HOB 30 degrees, suction prn DVT prophylaxis: SCD's GI prophylaxis: protonix Glucose control: SSI Mobility: bed rest last date of multidisciplinary goals of care discussion: Son updated at the bedside 11/25, wants full code and aggressive care Family and staff present  Summary of discussion  Follow up goals of care discussion due Code Status: Full code Disposition: ICU  Labs   CBC: Recent Labs  Lab 07/02/2020 1929  WBC 11.2*  NEUTROABS 8.8*  HGB 11.4*  HCT 35.7*  MCV 92.5  PLT 100*    Basic Metabolic Panel: Recent Labs  Lab 06/30/2020 1929  NA 130*  K 3.8  CL 98  CO2 19*  GLUCOSE 183*  BUN 28*  CREATININE 0.93  CALCIUM 8.4*   GFR: Estimated Creatinine Clearance: 40.4 mL/min (by C-G formula based on SCr of 0.93 mg/dL). Recent Labs  Lab 06/29/2020 1929  WBC 11.2*    Liver Function Tests: Recent Labs  Lab 07/11/2020 1929  AST 26  ALT 19  ALKPHOS 129*  BILITOT 1.0  PROT 5.5*  ALBUMIN 2.7*   No results for input(s): LIPASE, AMYLASE in the last 168 hours. No results for input(s): AMMONIA in the last 168 hours.  ABG    Component Value Date/Time   PHART 7.403 (H) 08/22/2008 0440   PCO2ART 46.4 (H) 08/22/2008 0440   PO2ART 59.2 (L) 08/22/2008 0440   HCO3 28.3 (H) 08/22/2008 0440   TCO2 24 01/30/2012 1842   ACIDBASEDEF 2.6 (H) 03/04/2008 1525   O2SAT 90.0 08/22/2008 0440     Coagulation Profile: Recent Labs  Lab 06/30/2020 1929  INR 1.2    Cardiac Enzymes: No results for input(s): CKTOTAL, CKMB, CKMBINDEX, TROPONINI in the last 168 hours.  HbA1C: Hemoglobin A1C  Date/Time Value Ref Range Status  10/28/2018 12:00 AM 7.7  Final  05/05/2018 12:00 AM 7.0  Final   Hgb A1c MFr Bld   Date/Time Value Ref Range Status  10/15/2019 05:20 AM 7.9 (H) 4.8 - 5.6 % Final    Comment:    (NOTE) Pre diabetes:          5.7%-6.4% Diabetes:              >6.4% Glycemic control for   <7.0% adults with diabetes   06/19/2019 09:06 AM 10.6 (H) 4.8 - 5.6 % Final    Comment:             Prediabetes: 5.7 - 6.4          Diabetes: >6.4          Glycemic control for adults with diabetes: <7.0     CBG: Recent Labs  Lab 07/19/2020 2148  GLUCAP 104*    Review of Systems:   Unable to obtain  Past Medical History  She,  has a past medical history of Cirrhosis, non-alcoholic ([place]), COPD (chronic obstructive pulmonary disease) ([place]), Coronary artery disease, Diabetes mellitus, Dyslipidemia, Fall, Hypotension (08/02/2010), Hypothyroidism, Leg edema, Legally blind, Myocardial infarct, old, Neuropathy of hand, Obesity, and Sleep apnea.   Surgical  History    Past Surgical History:  Procedure Laterality Date  . ABDOMINAL HYSTERECTOMY    . CARDIAC CATHETERIZATION     The ejection fraction is around 50%  . CHOLECYSTECTOMY    . CORONARY STENT PLACEMENT    . ESOPHAGOGASTRODUODENOSCOPY N/A 12/12/2012   Procedure: ESOPHAGOGASTRODUODENOSCOPY (EGD);  Surgeon: [name], MD;  Location: [name];  Service: Endoscopy;  Laterality: N/A;  . FLEXIBLE SIGMOIDOSCOPY N/A 12/12/2012   Procedure: FLEXIBLE SIGMOIDOSCOPY;  Surgeon: [name], MD;  Location: [facility];  Service: Endoscopy;  Laterality: N/A;  . IR PARACENTESIS  09/29/2018  . IR PARACENTESIS  10/01/2018  . TUBAL LIGATION    . WRIST SURGERY       Social History   reports that she quit smoking about 46 years ago. She has never used smokeless tobacco. She reports that she does not drink alcohol and does not use drugs.   Family History   Her family history includes Alcohol abuse in her maternal uncle and son; Cancer in her son; Diabetes in her brother, daughter, daughter, father, mother, sister, son, son, and son; Emphysema  in her brother; Heart attack in her daughter, father, and paternal grandfather; Heart disease in her father; Hyperlipidemia in her brother, father, and sister; Hypertension in her brother, father, and sister; Suicidality in her son.   Allergies Allergies  Allergen Reactions  . Ace Inhibitors Cough  . Benadryl [Diphenhydramine Hcl (Sleep)] Other (See Comments)    hypotension  . Fexofenadine Hcl Other (See Comments)  . Penicillins Hives and Swelling    Swelling of arms Has patient had a PCN reaction causing immediate rash, facial/tongue/throat swelling, SOB or lightheadedness with hypotension: unknown Has patient had a PCN reaction causing severe rash involving mucus membranes or skin necrosis: no Has patient had a PCN reaction that required hospitalization: unknown Has patient had a PCN reaction occurring within the last 10 years: no, childhood allergy If all of the above answers are "NO", then may proceed with Cephalosporin use.   . Sulfonamide Derivatives Swelling  . Sulfa Antibiotics Other (See Comments)    hypotension     Home Medications  Prior to Admission medications   Medication Sig Start Date End Date Taking? Authorizing Provider  albuterol (VENTOLIN HFA) 108 (90 Base) MCG/ACT inhaler INHALE 2 PUFFS BY MOUTH EVERY 6 HOURS AS NEEDED FOR WHEEZING OR SHORTNESS OF BREATH Patient taking differently: Inhale 2 puffs into the lungs every 6 (six) hours as needed for wheezing or shortness of breath.  08/26/19  Yes Opalski, Deborah, DO  brimonidine (ALPHAGAN) 0.2 % ophthalmic solution Place 1 drop into both eyes daily. 03/04/19  Yes [provider]  Cholecalciferol (VITAMIN D3) 50 MCG (2000 UT) TABS Take 2,000 Units by mouth daily.    Yes [provider]  diltiazem (CARDIZEM CD) 120 MG 24 hr capsule Take 1 capsule (120 mg total) by mouth daily. 05/19/20  Yes Nahser, Wonda Cheng, MD  escitalopram (LEXAPRO) 20 MG tablet Take 1 tablet (20 mg total) by mouth daily. 05/19/20  Yes  Abonza, Maritza, PA-C  furosemide (LASIX) 40 MG tablet Take 1 tablet by mouth in the morning daily.  You may take additional dose in the afternoon for edema/swelling 11/12/19  Yes Nahser, Wonda Cheng, MD  Insulin Aspart FlexPen 100 UNIT/ML SOPN INJECT 20 UNITS SUBCUTANEOUSLY THREE TIMES A DAY BEFORE MEALS; ADJUST AND INCREASE ACCORDINGLY. Patient taking differently: Inject 15 Units into the skin in the morning and at bedtime.  11/19/19  Yes Opalski, [name],  DO  insulin glargine (LANTUS) 100 UNIT/ML injection Inject 0.3 mLs (30 Units total) into the skin at bedtime. 05/19/20  Yes Abonza, Maritza, PA-C  levocetirizine (XYZAL) 5 MG tablet Take 1 tablet (5 mg total) by mouth every evening. During allergy season only 06/03/19  Yes Opalski, [name], DO  levothyroxine (SYNTHROID) 100 MCG tablet Take 1 tablet (100 mcg total) by mouth daily. 05/19/20  Yes Abonza, Maritza, PA-C  montelukast (SINGULAIR) 10 MG tablet Take 1 tablet (10 mg total) by mouth every evening. 05/19/20  Yes Abonza, Maritza, PA-C  Multiple Vitamins-Minerals (VISION FORMULA/LUTEIN) TABS Take 1 tablet by mouth daily.   Yes [provider]  nitroGLYCERIN (NITROSTAT) 0.4 MG SL tablet PLACE 1 TABLET UNDER THE TONGUE EVERY 5 MINUTES AS NEEDED FOR CHEST PAIN Patient taking differently: Place 0.4 mg under the tongue every 5 (five) minutes as needed for chest pain.  11/12/19  Yes Nahser, Wonda Cheng, MD  pantoprazole (PROTONIX) 20 MG tablet Take 1 tablet (20 mg total) by mouth 2 (two) times daily. I po qam can add I po qpm prn 05/19/20  Yes Abonza, Maritza, PA-C  RESTASIS 0.05 % ophthalmic emulsion Place 1 drop into both eyes 2 (two) times daily.  12/31/18  Yes [provider]  rifaximin (XIFAXAN) 550 MG TABS tablet Take 550 mg by mouth 2 (two) times daily.   Yes [provider]  spironolactone (ALDACTONE) 100 MG tablet Take 0.5 tablets (50 mg total) by mouth in the morning. Patient taking differently: Take 50 mg by mouth in the  morning. 1/2 tab on m, w, f 04/05/20  Yes Abonza, Maritza, PA-C  ursodiol (ACTIGALL) 500 MG tablet Take 500 mg by mouth in the morning and at bedtime.   Yes [provider]  Blood Glucose Monitoring Suppl (FREESTYLE LITE) DEVI Use to check blood sugars 2-3 times daily 01/07/17   Mellody Dance, DO  Continuous Blood Gluc Receiver (FREESTYLE LIBRE 14 DAY READER) DEVI USE TO MONITOR BLOOD SUGAR 3 TIMES DAILY 10/13/19   Opalski, [name], DO  Continuous Blood Gluc Sensor ([place]) MISC Use to monitor blood sugars TID 08/17/19   Opalski, [name], DO  isosorbide mononitrate (IMDUR) 30 MG 24 hr tablet Take 0.5 tablets (15 mg total) by mouth daily. Patient not taking: Reported on 06/25/2020 05/19/20   Nahser, Wonda Cheng, MD  Lancets [facility] DELICA PLUS [id number]) MISC USE TO CHECK BLOOD SUGAR THREE TIMES A DAY 11/12/19   Opalski, [name], DO  metoprolol tartrate (LOPRESSOR) 50 MG tablet Take 0.5 tablets (25 mg total) by mouth in the morning. Patient not taking: Reported on 07/09/2020 05/19/20   Lorrene Reid, PA-C  UNIFINE PENTIPS 31G X 5 MM MISC USE TO INJECT INSULIN 5 TIMES A DAY 07/01/19   Mellody Dance, DO     Critical care time: 45 minutes    CRITICAL CARE Performed by: Otilio Carpen [place] Grindle   Total critical care time: 45 minutes  Critical care time was exclusive of separately billable procedures and treating other patients.  Critical care was necessary to treat or prevent imminent or life-threatening deterioration.  Critical care was time spent personally by me on the following activities: development of treatment plan with patient and/or surrogate as well as nursing, discussions with consultants, evaluation of patient's response to treatment, examination of patient, obtaining history from patient or surrogate, ordering and performing treatments and interventions, ordering and review of laboratory studies, ordering and review of radiographic studies, pulse oximetry  and re-evaluation of patient's condition.  Mickel Baas  R Alaira Level, PA-C [place] [facility]  Pager# [phone], if no answer [phone]

## 2020-07-13 NOTE — ED Provider Notes (Signed)
[facility] EMERGENCY DEPARTMENT Provider Note   CSN: [id number] Arrival date & time:        History Chief Complaint  Patient presents with  . Loss of Consciousness  . Hematemesis    [name] is a 86 y.o. female.  HPI   This patient is an 87 year old female, she has a history of nonalcoholic cirrhosis of the liver as well as a history of COPD, she has had a prior myocardial infarction, she is a diabetic and has dyslipidemia.  Has recently had some difficulty with syncope and was seen at her doctor's office where they had taken her off her blood pressure medications as she was orthostatic and having positional syncope.  This evening she was feeling in her usual state of health very good however she went to the bathroom and while she was going to the bathroom she became acutely lightheaded and passed out to the ground.  When she came around she started to vomit up bright red blood and blood clot.  She was eventually able to make it to the bathroom to finish vomiting.  The paramedics found the patient to be hypotensive with a blood pressure of 82 systolic, weak and pale.  In route to the hospital she was given about 500 cc of IV fluids and states that she feels a little bit better though she feels generally weak diffusely.  She has had black stools but states that is not unusual as she takes iron.  She sees Dr. Therisa Doyne, Dr. Therisa Doyne had recently diagnosed her with cirrhosis, she has not had a recent colonoscopy or endoscopy according to the patient's report.  The patient did undergo an endoscopy in 2016 by Dr. Lyndel Safe during which time it was found that she had 2 esophageal varices both of which were too small to treat and her stomach had portal hypertensive gastropathy.  She had multiple polyps in the stomach in the antrum, the duodenum was also evaluated with a polyp.  The patient has atrial fibrillation but is not anticoagulated secondary to the history of esophageal  varices, cirrhosis and potential for bleeding.  The patient denies to me that she takes any ibuprofen or aspirin-containing products.  She does not drink alcohol.  She does take Tylenol about twice a day for her back pain  Past Medical History:  Diagnosis Date  . Cirrhosis, non-alcoholic ([place])   . COPD (chronic obstructive pulmonary disease) ([place])   . Coronary artery disease    status post inferior wall myocardial infarction  . Diabetes mellitus   . Dyslipidemia   . Fall   . Hypotension 08/02/2010   recent episodes of orthostatic hypotension  . Hypothyroidism   . Leg edema   . Legally blind    left eye, partial blindness right eye per daughter  . Myocardial infarct, old   . Neuropathy of hand    left hand numbness/neuropathy  . Obesity   . Sleep apnea     Patient Active Problem List   Diagnosis Date Noted  . Diabetes ([place]) 07/10/2020  . Syncope and collapse 07/15/2020  . Hyponatremia 06/24/2020  . GI bleed 06/23/2020  . GIB (gastrointestinal bleeding) 06/20/2020  . Class 1 obesity 10/19/2019  . Atrial fibrillation with RVR ([place])   . COPD with acute exacerbation ([place]) 10/14/2019  . Environmental and seasonal allergies 06/03/2019  . Ascites of liver 05/05/2019  . Portal hypertension ([place]) 05/05/2019  . Decompensated hepatic cirrhosis ([place]) 04/17/2019  . Myocardial infarct, old 01/01/2019  .  Paroxysmal atrial fibrillation ([place]) 10/28/2018  . Liver cirrhosis secondary to NASH ([place]) 10/16/2018  . Pancytopenia ([place]) 09/26/2018  . COPD exacerbation ([place]) 09/26/2018  . Acute on chronic diastolic CHF (congestive heart failure) ([place]) 09/26/2018  . Acute on chronic respiratory failure with hypoxia ([place]) 09/26/2018  . PNA (pneumonia) 09/15/2018  . Microalbuminuria due to type 2 diabetes mellitus ([place]) 08/26/2018  . Hypocalcemia 03/12/2018  . Gastroesophageal reflux disease 12/31/2017  . chronic Low platelet count 06/05/2017  . Hypertension associated with diabetes ([place])  05/22/2017  . Morbidly obese ([place])- BMI >er 35 w DM 06/23/2016  . chronic Leukopenia 05/11/2016  . Vitamin D deficiency 05/11/2016  . Elevated serum alkaline phosphatase level- hepatocellular in origin 05/11/2016  . Depression 03/13/2016  . Hypothyroidism 03/13/2016  . Poorly controlled diabetes mellitus ([place]) 03/13/2016  . Mixed diabetic hyperlipidemia associated with type 1 diabetes mellitus ([place]) 03/13/2016  . Constipation, chronic/  IBS syndrome 03/13/2016  . At high risk for osteoporosis 03/13/2016  . Hypotension 12/13/2012  . Hypokalemia due to excessive renal loss of potassium 12/13/2012  . Thrombocytopenia, unspecified ([place]) 12/13/2012  . h/o Clostridium difficile colitis 12/12/2012  . History of GI bleed 12/12/2012  . ILD (interstitial lung disease) ([place]) 11/25/2012  . Leg edema 01/07/2012  . Coronary artery disease 04/11/2011  . COPD (chronic obstructive pulmonary disease) ([place]) 04/11/2011  . Obstructive sleep apnea 04/11/2011  . Chronic diastolic heart failure ([place]) 03/29/2008  . Atherosclerosis of carotid arteries 04/03/2006    Past Surgical History:  Procedure Laterality Date  . ABDOMINAL HYSTERECTOMY    . CARDIAC CATHETERIZATION     The ejection fraction is around 50%  . CHOLECYSTECTOMY    . CORONARY STENT PLACEMENT    . ESOPHAGOGASTRODUODENOSCOPY N/A 12/12/2012   Procedure: ESOPHAGOGASTRODUODENOSCOPY (EGD);  Surgeon: [name], MD;  Location: [name];  Service: Endoscopy;  Laterality: N/A;  . FLEXIBLE SIGMOIDOSCOPY N/A 12/12/2012   Procedure: FLEXIBLE SIGMOIDOSCOPY;  Surgeon: [name], MD;  Location: [facility];  Service: Endoscopy;  Laterality: N/A;  . IR PARACENTESIS  09/29/2018  . IR PARACENTESIS  10/01/2018  . TUBAL LIGATION    . WRIST SURGERY       OB History   No obstetric history on file.     Family History  Problem Relation Age of Onset  . Emphysema Brother        non smoker  . Diabetes Brother   . Hyperlipidemia Brother     . Hypertension Brother   . Heart disease Father   . Heart attack Father   . Diabetes Father   . Hyperlipidemia Father   . Hypertension Father   . Diabetes Mother   . Diabetes Sister   . Hyperlipidemia Sister   . Hypertension Sister   . Heart attack Daughter   . Diabetes Daughter   . Diabetes Son   . Suicidality Son   . Alcohol abuse Son   . Alcohol abuse Maternal Uncle   . Heart attack Paternal Grandfather   . Diabetes Daughter   . Cancer Son        melanoma  . Diabetes Son   . Diabetes Son     Social History   Tobacco Use  . Smoking status: Former Smoker    Quit date: 08/20/1973    Years since quitting: 46.9  . Smokeless tobacco: Never Used  Vaping Use  . Vaping Use: Never used  Substance Use Topics  . Alcohol use: No  . Drug use: No  Home Medications Prior to Admission medications   Medication Sig Start Date End Date Taking? Authorizing Provider  albuterol (VENTOLIN HFA) 108 (90 Base) MCG/ACT inhaler INHALE 2 PUFFS BY MOUTH EVERY 6 HOURS AS NEEDED FOR WHEEZING OR SHORTNESS OF BREATH Patient taking differently: Inhale 2 puffs into the lungs every 6 (six) hours as needed for wheezing or shortness of breath.  08/26/19  Yes Opalski, Deborah, DO  brimonidine (ALPHAGAN) 0.2 % ophthalmic solution Place 1 drop into both eyes daily. 03/04/19  Yes [provider]  Cholecalciferol (VITAMIN D3) 50 MCG (2000 UT) TABS Take 2,000 Units by mouth daily.    Yes [provider]  diltiazem (CARDIZEM CD) 120 MG 24 hr capsule Take 1 capsule (120 mg total) by mouth daily. 05/19/20  Yes Nahser, Wonda Cheng, MD  escitalopram (LEXAPRO) 20 MG tablet Take 1 tablet (20 mg total) by mouth daily. 05/19/20  Yes Abonza, Maritza, PA-C  furosemide (LASIX) 40 MG tablet Take 1 tablet by mouth in the morning daily.  You may take additional dose in the afternoon for edema/swelling 11/12/19  Yes Nahser, Wonda Cheng, MD  Insulin Aspart FlexPen 100 UNIT/ML SOPN INJECT 20 UNITS SUBCUTANEOUSLY THREE  TIMES A DAY BEFORE MEALS; ADJUST AND INCREASE ACCORDINGLY. Patient taking differently: Inject 15 Units into the skin in the morning and at bedtime.  11/19/19  Yes Opalski, [name], DO  insulin glargine (LANTUS) 100 UNIT/ML injection Inject 0.3 mLs (30 Units total) into the skin at bedtime. 05/19/20  Yes Abonza, Maritza, PA-C  levocetirizine (XYZAL) 5 MG tablet Take 1 tablet (5 mg total) by mouth every evening. During allergy season only 06/03/19  Yes Opalski, [name], DO  levothyroxine (SYNTHROID) 100 MCG tablet Take 1 tablet (100 mcg total) by mouth daily. 05/19/20  Yes Abonza, Maritza, PA-C  montelukast (SINGULAIR) 10 MG tablet Take 1 tablet (10 mg total) by mouth every evening. 05/19/20  Yes Abonza, Maritza, PA-C  Multiple Vitamins-Minerals (VISION FORMULA/LUTEIN) TABS Take 1 tablet by mouth daily.   Yes [provider]  nitroGLYCERIN (NITROSTAT) 0.4 MG SL tablet PLACE 1 TABLET UNDER THE TONGUE EVERY 5 MINUTES AS NEEDED FOR CHEST PAIN Patient taking differently: Place 0.4 mg under the tongue every 5 (five) minutes as needed for chest pain.  11/12/19  Yes Nahser, Wonda Cheng, MD  pantoprazole (PROTONIX) 20 MG tablet Take 1 tablet (20 mg total) by mouth 2 (two) times daily. I po qam can add I po qpm prn 05/19/20  Yes Abonza, Maritza, PA-C  RESTASIS 0.05 % ophthalmic emulsion Place 1 drop into both eyes 2 (two) times daily.  12/31/18  Yes [provider]  rifaximin (XIFAXAN) 550 MG TABS tablet Take 550 mg by mouth 2 (two) times daily.   Yes [provider]  spironolactone (ALDACTONE) 100 MG tablet Take 0.5 tablets (50 mg total) by mouth in the morning. Patient taking differently: Take 50 mg by mouth in the morning. 1/2 tab on m, w, f 04/05/20  Yes Abonza, Maritza, PA-C  ursodiol (ACTIGALL) 500 MG tablet Take 500 mg by mouth in the morning and at bedtime.   Yes [provider]  Blood Glucose Monitoring Suppl (FREESTYLE LITE) DEVI Use to check blood sugars 2-3 times daily 01/07/17    Mellody Dance, DO  Continuous Blood Gluc Receiver (FREESTYLE LIBRE 14 DAY READER) DEVI USE TO MONITOR BLOOD SUGAR 3 TIMES DAILY 10/13/19   Opalski, [name], DO  Continuous Blood Gluc Sensor ([place]) MISC Use to monitor blood sugars TID 08/17/19  Mellody Dance, DO  isosorbide mononitrate (IMDUR) 30 MG 24 hr tablet Take 0.5 tablets (15 mg total) by mouth daily. Patient not taking: Reported on 07/09/2020 05/19/20   Nahser, Wonda Cheng, MD  Lancets [facility] DELICA PLUS [id number]) MISC USE TO CHECK BLOOD SUGAR THREE TIMES A DAY 11/12/19   Opalski, [name], DO  metoprolol tartrate (LOPRESSOR) 50 MG tablet Take 0.5 tablets (25 mg total) by mouth in the morning. Patient not taking: Reported on 07/09/2020 05/19/20   Lorrene Reid, PA-C  UNIFINE PENTIPS 31G X 5 MM MISC USE TO INJECT INSULIN 5 TIMES A DAY 07/01/19   Opalski, Deborah, DO    Allergies    Ace inhibitors, Benadryl [diphenhydramine hcl (sleep)], Fexofenadine hcl, Penicillins, Sulfonamide derivatives, and Sulfa antibiotics  Review of Systems   Review of Systems  All other systems reviewed and are negative.   Physical Exam Updated Vital Signs BP (!) 141/71   Pulse (!) 101   Resp 16   Ht 1.524 m (5')   Wt 71.2 kg   SpO2 100%   BMI 30.66 kg/m   Physical Exam Vitals and nursing note reviewed.  Constitutional:      General: She is not in acute distress.    Appearance: She is well-developed.  HENT:     Head: Normocephalic and atraumatic.     Mouth/Throat:     Pharynx: No oropharyngeal exudate.  Eyes:     General: No scleral icterus.       Right eye: No discharge.        Left eye: No discharge.     Conjunctiva/sclera: Conjunctivae normal.     Pupils: Pupils are equal, round, and reactive to light.  Neck:     Thyroid: No thyromegaly.     Vascular: No JVD.  Cardiovascular:     Rate and Rhythm: Normal rate. Rhythm irregular.     Heart sounds: Normal heart sounds. No murmur heard.  No friction rub.  No gallop.   Pulmonary:     Effort: Pulmonary effort is normal. No respiratory distress.     Breath sounds: Normal breath sounds. No wheezing or rales.  Abdominal:     General: Bowel sounds are normal. There is no distension.     Palpations: Abdomen is soft. There is no mass.     Tenderness: There is no abdominal tenderness.  Musculoskeletal:        General: No tenderness. Normal range of motion.     Cervical back: Normal range of motion and neck supple.     Right lower leg: No edema.     Left lower leg: No edema.  Lymphadenopathy:     Cervical: No cervical adenopathy.  Skin:    General: Skin is warm and dry.     Findings: No erythema or rash.  Neurological:     Mental Status: She is alert.     Coordination: Coordination normal.  Psychiatric:        Behavior: Behavior normal.     ED Results / Procedures / Treatments   Labs (all labs ordered are listed, but only abnormal results are displayed) Labs Reviewed  CBC WITH DIFFERENTIAL/PLATELET - Abnormal; Notable for the following components:      Result Value   WBC 11.2 (*)    RBC 3.86 (*)    Hemoglobin 11.4 (*)    HCT 35.7 (*)    Platelets 100 (*)    Neutro Abs 8.8 (*)    Monocytes Absolute 1.1 (*)    Abs  Immature Granulocytes 0.08 (*)    All other components within normal limits  COMPREHENSIVE METABOLIC PANEL - Abnormal; Notable for the following components:   Sodium 130 (*)    CO2 19 (*)    Glucose, Bld 183 (*)    BUN 28 (*)    Calcium 8.4 (*)    Total Protein 5.5 (*)    Albumin 2.7 (*)    Alkaline Phosphatase 129 (*)    All other components within normal limits  RETICULOCYTES - Abnormal; Notable for the following components:   RBC. 3.84 (*)    All other components within normal limits  CBG MONITORING, ED - Abnormal; Notable for the following components:   Glucose-Capillary 104 (*)    All other components within normal limits  RESP PANEL BY RT-PCR (FLU A&B, COVID) ARPGX2  APTT  PROTIME-INR  VITAMIN B12    FOLATE  IRON AND TIBC  FERRITIN  OCCULT BLOOD GASTRIC / DUODENUM (SPECIMEN CUP)  HEMOGLOBIN A1C  COMPREHENSIVE METABOLIC PANEL  CBC  CBC  CBC  CBC  BLOOD GAS, ARTERIAL  BASIC METABOLIC PANEL  BLOOD GAS, ARTERIAL  MAGNESIUM  PHOSPHORUS  TYPE AND SCREEN  PREPARE RBC (CROSSMATCH)    EKG EKG Interpretation  Date/Time:  Wednesday July 13 2020 19:23:34 EST Ventricular Rate:  88 PR Interval:    QRS Duration: 97 QT Interval:  398 QTC Calculation: 482 R Axis:   58 Text Interpretation: Sinus rhythm Multiform ventricular premature complexes Inferior infarct, age indeterminate Anterior infarct, old Since last tracing rate slower Confirmed by Noemi Chapel [phone]) on 07/09/2020 8:19:04 PM   Radiology No results found.  Procedures .Critical Care Performed by: Noemi Chapel, MD Authorized by: Noemi Chapel, MD   Critical care provider statement:    Critical care time (minutes):  75   Critical care time was exclusive of:  Separately billable procedures and treating other patients and teaching time   Critical care was necessary to treat or prevent imminent or life-threatening deterioration of the following conditions: Upper GI Bleeding.   Critical care was time spent personally by me on the following activities:  Blood draw for specimens, development of treatment plan with patient or surrogate, discussions with consultants, evaluation of patient's response to treatment, examination of patient, obtaining history from patient or surrogate, ordering and performing treatments and interventions, ordering and review of laboratory studies, ordering and review of radiographic studies, pulse oximetry, re-evaluation of patient's condition and review of old charts Comments:        .Central Line  Date/Time: 07/12/2020 11:11 PM Performed by: Noemi Chapel, MD Authorized by: Noemi Chapel, MD   Consent:    Consent obtained:  Verbal   Consent given by:  Patient   Risks discussed:   Arterial puncture, incorrect placement, bleeding and nerve damage   Alternatives discussed:  Alternative treatment Universal protocol:    Procedure explained and questions answered to patient or proxy's satisfaction: yes     Relevant documents present and verified: yes     Test results available and properly labeled: yes     Imaging studies available: yes     Required blood products, implants, devices, and special equipment available: yes     Site/side marked: yes     Immediately prior to procedure, a time out was called: yes     Patient identity confirmed:  Verbally with patient Pre-procedure details:    Hand hygiene: Hand hygiene performed prior to insertion     Sterile barrier technique: All elements of maximal sterile  technique followed     Skin preparation:  ChloraPrep   Skin preparation agent: Skin preparation agent completely dried prior to procedure   Anesthesia (see MAR for exact dosages):    Anesthesia method:  None Procedure details:    Location:  R femoral   Patient position:  Flat   Procedural supplies:  Triple lumen   Catheter size:  7 Fr   Landmarks identified: yes     Ultrasound guidance: no     Number of attempts:  1   Successful placement: yes   Post-procedure details:    Post-procedure:  Dressing applied and line sutured   Assessment:  Blood return through all ports and free fluid flow   Patient tolerance of procedure:  Tolerated well, no immediate complications Comments:           Procedure Name: Intubation Date/Time: 06/22/2020 11:57 PM Performed by: Noemi Chapel, MD Pre-anesthesia Checklist: Patient identified, Patient being monitored, Emergency Drugs available, Timeout performed and Suction available Oxygen Delivery Method: Non-rebreather mask Preoxygenation: Pre-oxygenation with 100% oxygen Induction Type: Rapid sequence Ventilation: Mask ventilation without difficulty Laryngoscope Size: Mac and 4 Tube size: 7.0 mm Number of attempts: 1 Airway  Equipment and Method: Stylet Placement Confirmation: ETT inserted through vocal cords under direct vision,  CO2 detector and Breath sounds checked- equal and bilateral Secured at: 21 cm Tube secured with: ETT holder Dental Injury: Teeth and Oropharynx as per pre-operative assessment  Difficulty Due To: Difficulty was anticipated Comments:         (including critical care time)  Medications Ordered in ED Medications  octreotide (SANDOSTATIN) 2 mcg/mL load via infusion 50 mcg (50 mcg Intravenous Bolus from Bag 07/04/2020 2151)    And  octreotide (SANDOSTATIN) 500 mcg in sodium chloride 0.9 % 250 mL (2 mcg/mL) infusion (50 mcg/hr Intravenous New Bag/Given 06/24/2020 2151)  sodium chloride flush (NS) 0.9 % injection 3 mL (has no administration in time range)  lactated ringers infusion (has no administration in time range)  ondansetron (ZOFRAN) tablet 4 mg (has no administration in time range)    Or  ondansetron (ZOFRAN) injection 4 mg (has no administration in time range)  pantoprazole (PROTONIX) 80 mg in sodium chloride 0.9 % 100 mL IVPB (has no administration in time range)  pantoprazole (PROTONIX) 80 mg in sodium chloride 0.9 % 100 mL (0.8 mg/mL) infusion (has no administration in time range)  pantoprazole (PROTONIX) injection 40 mg (has no administration in time range)  insulin aspart (novoLOG) injection 0-9 Units (has no administration in time range)  insulin aspart (novoLOG) injection 0-5 Units (0 Units Subcutaneous Not Given 06/22/2020 2214)  0.9 %  sodium chloride infusion (has no administration in time range)  norepinephrine (LEVOPHED) 26m in 2539mpremix infusion (3 mcg/min Intravenous Rate/Dose Change 06/28/2020 2302)  midazolam (VERSED) injection 4 mg (has no administration in time range)  fentaNYL (SUBLIMAZE) injection 100 mcg (has no administration in time range)  propofol (DIPRIVAN) 10 mg/mL bolus/IV push 71.2 mg (has no administration in time range)  0.9 %  sodium chloride  infusion (Manually program via Guardrails IV Fluids) (has no administration in time range)  cefTRIAXone (ROCEPHIN) 1 g in sodium chloride 0.9 % 100 mL IVPB (has no administration in time range)  cefTRIAXone (ROCEPHIN) 1 g in sodium chloride 0.9 % 100 mL IVPB (has no administration in time range)  etomidate (AMIDATE) 2 MG/ML injection (has no administration in time range)  succinylcholine (ANECTINE) 200 MG/10ML syringe (has no administration in time range)  docusate  sodium (COLACE) capsule 100 mg (has no administration in time range)  polyethylene glycol (MIRALAX / GLYCOLAX) packet 17 g (has no administration in time range)  propofol (DIPRIVAN) 1000 MG/100ML infusion (has no administration in time range)  famotidine (PEPCID) IVPB 20 mg premix (0 mg Intravenous Stopped 06/25/2020 2021)  pantoprazole (PROTONIX) injection 40 mg (40 mg Intravenous Given 07/17/2020 1941)  ondansetron (ZOFRAN) injection 4 mg (4 mg Intravenous Given 06/29/2020 2212)    ED Course  I have reviewed the triage vital signs and the nursing notes.  Pertinent labs & imaging results that were available during my care of the patient were reviewed by me and considered in my medical decision making (see chart for details).    MDM Rules/Calculators/A&P                          Not tachycardic but has sig history of esophageal varices and likely needs admission with GI consultation - she is hypotensive at 100/67, has had some fluid - will start PPI, pepcid, NPO, and have hospitalist admit.  The patient has had ongoing borderline hypotension which had gradually gotten worse.  She then became nauseated and vomited approximately 150 cc of bright red blood.  Platelets are slightly low at 100,000, hemoglobin is slightly low at just over 11, BUN is elevated in the upper 20s  EKG is rather unremarkable  I discussed the case with gastroenterology prior to this occurring, they had said that they would see the patient and recommended  octreotide.  The octreotide had been started but then the patient vomited.  The patient has been admitted to Dr. Jonelle Sidle of the hospitalist service, IV fluids are running to help with blood pressure  I will repage gastroenterology for more emergent evaluation given the recurrent acute bleeding.  This patient is critically ill with acute upper gastrointestinal hemorrhage.  Millie the patient persisted and her hypotension in the 60s, Levophed was started, central line was placed in the right femoral vein on first attempt after the patient gave me verbal consent.  Medications for sedation ordered and are at the bedside.  Blood transfusion has been ordered by hospitalist to help with hypotension and blood loss.  Critical care has been paged as the patient will likely need ICU level care overnight.  Discussed with Mickel Baas with CC - they will put her on the list to see and admit to ICU tonight.  Dr. Rosalie Gums at the bedside with me, EGD to be done at bedside.    Pt continued to hemorrhage - decision was made with GI and CCM, that pt needed to be intubated - this was done on the first attempt - pt currently getting endoscopy, blood, octreotide, levophed and protonix gtt -   Final Clinical Impression(s) / ED Diagnoses Final diagnoses:  Hematemesis with nausea  Upper GI bleeding      Noemi Chapel, MD 06/28/2020 0000

## 2020-07-13 NOTE — H&P (Signed)
History and Physical   [name] OAC:[id number] DOB: [birth year] DOA: 07/01/2020  Referring MD/NP/PA: Dr. Sabra Heck  PCP: System, Provider Not In   Outpatient Specialists: [name]  Patient coming from: Home  Chief Complaint: Passing out  HPI: [name] is a 86 y.o. female with medical history significant of [name] cirrhosis, coronary artery disease, COPD, diabetes, hyperlipidemia, obstructive sleep apnea, morbid obesity who presents with passing out at home. She was apparently walking to the bathroom when she passed out. She also vomited a lot of blood. She came in weak and dizzy. She has prior history of GI bleed. Patient noted to have significant melena. Also apparently has had hematemesis as well. Her hemoglobin is stable at this point about 11 g. EMS noted the weakness. Patient has received fluid boluses and apparently started on octreotide in the ER. She has previous history of esophageal varices from her cirrhosis. She is currently stable. She is being admitted with upper GI bleed with syncope as a result.. She denied any fever or chills.  ED Course: Temperature 98.4 blood pressure 85/53 pulse 100 respirate of 18 oxygen sat 97% on room air. White count 11.2,  hemoglobin 11.4 with platelets 100 sodium 130 potassium 3.8 chloride 90 CO2 of 19 BUN 28 creatinine 0.19 calcium 8.4 INR 1.2 glucose 183. Respiratory panel acid is negative. Patient is type and crossmatch and GI consulted.  Review of Systems: As per HPI otherwise 10 point review of systems negative.    Past Medical History:  Diagnosis Date  . Cirrhosis, non-alcoholic ([place])   . COPD (chronic obstructive pulmonary disease) ([place])   . Coronary artery disease    status post inferior wall myocardial infarction  . Diabetes mellitus   . Dyslipidemia   . Fall   . Hypotension 08/02/2010   recent episodes of orthostatic hypotension  . Hypothyroidism   . Leg edema   . Legally blind    left eye, partial blindness right eye  per daughter  . Myocardial infarct, old   . Neuropathy of hand    left hand numbness/neuropathy  . Obesity   . Sleep apnea     Past Surgical History:  Procedure Laterality Date  . ABDOMINAL HYSTERECTOMY    . CARDIAC CATHETERIZATION     The ejection fraction is around 50%  . CHOLECYSTECTOMY    . CORONARY STENT PLACEMENT    . ESOPHAGOGASTRODUODENOSCOPY N/A 12/12/2012   Procedure: ESOPHAGOGASTRODUODENOSCOPY (EGD);  Surgeon: [name], MD;  Location: [name];  Service: Endoscopy;  Laterality: N/A;  . FLEXIBLE SIGMOIDOSCOPY N/A 12/12/2012   Procedure: FLEXIBLE SIGMOIDOSCOPY;  Surgeon: [name], MD;  Location: [facility];  Service: Endoscopy;  Laterality: N/A;  . IR PARACENTESIS  09/29/2018  . IR PARACENTESIS  10/01/2018  . TUBAL LIGATION    . WRIST SURGERY       reports that she quit smoking about 46 years ago. She has never used smokeless tobacco. She reports that she does not drink alcohol and does not use drugs.  Allergies  Allergen Reactions  . Ace Inhibitors Cough  . Benadryl [Diphenhydramine Hcl (Sleep)] Other (See Comments)    hypotension  . Fexofenadine Hcl Other (See Comments)  . Penicillins Hives and Swelling    Swelling of arms Has patient had a PCN reaction causing immediate rash, facial/tongue/throat swelling, SOB or lightheadedness with hypotension: unknown Has patient had a PCN reaction causing severe rash involving mucus membranes or skin necrosis: no Has patient had a PCN reaction that  required hospitalization: unknown Has patient had a PCN reaction occurring within the last 10 years: no, childhood allergy If all of the above answers are "NO", then may proceed with Cephalosporin use.   . Sulfonamide Derivatives Swelling  . Sulfa Antibiotics Other (See Comments)    hypotension    Family History  Problem Relation Age of Onset  . Emphysema Brother        non smoker  . Diabetes Brother   . Hyperlipidemia Brother   . Hypertension Brother    . Heart disease Father   . Heart attack Father   . Diabetes Father   . Hyperlipidemia Father   . Hypertension Father   . Diabetes Mother   . Diabetes Sister   . Hyperlipidemia Sister   . Hypertension Sister   . Heart attack Daughter   . Diabetes Daughter   . Diabetes Son   . Suicidality Son   . Alcohol abuse Son   . Alcohol abuse Maternal Uncle   . Heart attack Paternal Grandfather   . Diabetes Daughter   . Cancer Son        melanoma  . Diabetes Son   . Diabetes Son      Prior to Admission medications   Medication Sig Start Date End Date Taking? Authorizing Provider  albuterol (VENTOLIN HFA) 108 (90 Base) MCG/ACT inhaler INHALE 2 PUFFS BY MOUTH EVERY 6 HOURS AS NEEDED FOR WHEEZING OR SHORTNESS OF BREATH 08/26/19   Opalski, [name], DO  azithromycin (ZITHROMAX) 250 MG tablet Take 2 tablets by mouth on day 1, then take 1 tablet by mouth once daily x 4 days. 05/11/20   Lorrene Reid, PA-C  Blood Glucose Monitoring Suppl (FREESTYLE LITE) DEVI Use to check blood sugars 2-3 times daily 01/07/17   Opalski, Deborah, DO  brimonidine (ALPHAGAN) 0.2 % ophthalmic solution Place 1 drop into both eyes daily. 03/04/19   [provider]  Cholecalciferol (VITAMIN D3) 50 MCG (2000 UT) TABS Take by mouth daily.     [provider]  Continuous Blood Gluc Receiver (FREESTYLE LIBRE 14 DAY READER) DEVI USE TO MONITOR BLOOD SUGAR 3 TIMES DAILY 10/13/19   Opalski, [name], DO  Continuous Blood Gluc Sensor ([place]) MISC Use to monitor blood sugars TID 08/17/19   Opalski, [name], DO  diltiazem (CARDIZEM CD) 120 MG 24 hr capsule Take 1 capsule (120 mg total) by mouth daily. 05/19/20   Nahser, Wonda Cheng, MD  escitalopram (LEXAPRO) 20 MG tablet Take 1 tablet (20 mg total) by mouth daily. 05/19/20   Lorrene Reid, PA-C  furosemide (LASIX) 40 MG tablet Take 1 tablet by mouth in the morning daily.  You may take additional dose in the afternoon for edema/swelling 11/12/19    Nahser, Wonda Cheng, MD  Insulin Aspart FlexPen 100 UNIT/ML SOPN INJECT 20 UNITS SUBCUTANEOUSLY THREE TIMES A DAY BEFORE MEALS; ADJUST AND INCREASE ACCORDINGLY. 11/19/19   Opalski, [name], DO  insulin glargine (LANTUS) 100 UNIT/ML injection Inject 0.3 mLs (30 Units total) into the skin at bedtime. 05/19/20   Lorrene Reid, PA-C  isosorbide mononitrate (IMDUR) 30 MG 24 hr tablet Take 0.5 tablets (15 mg total) by mouth daily. 05/19/20   Nahser, Wonda Cheng, MD  Lancets [facility] DELICA PLUS [id number]) MISC USE TO CHECK BLOOD SUGAR THREE TIMES A DAY 11/12/19   Opalski, [name], DO  levocetirizine (XYZAL) 5 MG tablet Take 1 tablet (5 mg total) by mouth every evening. During allergy season only 06/03/19   Mellody Dance, DO  levothyroxine (  SYNTHROID) 100 MCG tablet Take 1 tablet (100 mcg total) by mouth daily. 05/19/20   Lorrene Reid, PA-C  metoprolol tartrate (LOPRESSOR) 50 MG tablet Take 0.5 tablets (25 mg total) by mouth in the morning. 05/19/20   Abonza, Maritza, PA-C  montelukast (SINGULAIR) 10 MG tablet Take 1 tablet (10 mg total) by mouth every evening. 05/19/20   Lorrene Reid, PA-C  Multiple Vitamins-Minerals (VISION FORMULA/LUTEIN) TABS Take 1 tablet by mouth daily.    [provider]  nitroGLYCERIN (NITROSTAT) 0.4 MG SL tablet PLACE 1 TABLET UNDER THE TONGUE EVERY 5 MINUTES AS NEEDED FOR CHEST PAIN 11/12/19   Nahser, Wonda Cheng, MD  pantoprazole (PROTONIX) 20 MG tablet Take 1 tablet (20 mg total) by mouth 2 (two) times daily. I po qam can add I po qpm prn 05/19/20   Abonza, Maritza, PA-C  RESTASIS 0.05 % ophthalmic emulsion Place 1 drop into both eyes 2 (two) times daily. 1 drop both eyes twice daily 12/31/18   [provider]  rifaximin (XIFAXAN) 550 MG TABS tablet Take 550 mg by mouth 2 (two) times daily.    [provider]  spironolactone (ALDACTONE) 100 MG tablet Take 0.5 tablets (50 mg total) by mouth in the morning. 04/05/20   Abonza, Maritza, PA-C  UNIFINE PENTIPS 31G X 5  MM MISC USE TO INJECT INSULIN 5 TIMES A DAY 07/01/19   Opalski, Deborah, DO  ursodiol (ACTIGALL) 500 MG tablet Take 500 mg by mouth in the morning and at bedtime.    [provider]    Physical Exam: Vitals:   07/16/2020 2045 07/19/2020 2115 06/20/2020 2130 07/08/2020 2145  BP: (!) 104/43 (!) 97/42 (!) 95/42 (!) 85/53  Pulse: 95 96 94 100  Resp: 14 12 16 18   SpO2: 99% 99% 99% 99%      Constitutional: Acutely ill in mild distress Vitals:   07/19/2020 2045 06/25/2020 2115 07/09/2020 2130 07/02/2020 2145  BP: (!) 104/43 (!) 97/42 (!) 95/42 (!) 85/53  Pulse: 95 96 94 100  Resp: 14 12 16 18   SpO2: 99% 99% 99% 99%   Eyes: PERRL, lids and conjunctivae normal ENMT: Mucous membranes are dry. Posterior pharynx clear of any exudate or lesions.Normal dentition.  Neck: normal, supple, no masses, no thyromegaly Respiratory: clear to auscultation bilaterally, no wheezing, no crackles. Normal respiratory effort. No accessory muscle use.  Cardiovascular: Sinus tachycardia, no murmurs / rubs / gallops. No extremity edema. 2+ pedal pulses. No carotid bruits.  Abdomen: no tenderness, no masses palpated. No hepatosplenomegaly. Bowel sounds positive.  Musculoskeletal: no clubbing / cyanosis. No joint deformity upper and lower extremities. Good ROM, no contractures. Normal muscle tone.  Skin: no rashes, lesions, ulcers. No induration Neurologic: CN 2-12 grossly intact. Sensation intact, DTR normal. Strength 5/5 in all 4.  Psychiatric: Normal judgment and insight. Alert and oriented x 3. Normal mood.     Labs on Admission: I have personally reviewed following labs and imaging studies  CBC: Recent Labs  Lab 06/28/2020 1929  WBC 11.2*  NEUTROABS 8.8*  HGB 11.4*  HCT 35.7*  MCV 92.5  PLT 462*   Basic Metabolic Panel: Recent Labs  Lab 06/29/2020 1929  NA 130*  K 3.8  CL 98  CO2 19*  GLUCOSE 183*  BUN 28*  CREATININE 0.93  CALCIUM 8.4*   GFR: CrCl cannot be calculated (Unknown ideal  weight.). Liver Function Tests: Recent Labs  Lab 06/20/2020 1929  AST 26  ALT 19  ALKPHOS 129*  BILITOT 1.0  PROT  5.5*  ALBUMIN 2.7*   No results for input(s): LIPASE, AMYLASE in the last 168 hours. No results for input(s): AMMONIA in the last 168 hours. Coagulation Profile: Recent Labs  Lab 07/17/2020 1929  INR 1.2   Cardiac Enzymes: No results for input(s): CKTOTAL, CKMB, CKMBINDEX, TROPONINI in the last 168 hours. BNP (last 3 results) No results for input(s): PROBNP in the last 8760 hours. HbA1C: No results for input(s): HGBA1C in the last 72 hours. CBG: Recent Labs  Lab 06/30/2020 2148  GLUCAP 104*   Lipid Profile: No results for input(s): CHOL, HDL, LDLCALC, TRIG, CHOLHDL, LDLDIRECT in the last 72 hours. Thyroid Function Tests: No results for input(s): TSH, T4TOTAL, FREET4, T3FREE, THYROIDAB in the last 72 hours. Anemia Panel: Recent Labs    07/04/2020 1925 06/28/2020 1929  VITAMINB12  --  188  FOLATE 13.5  --   FERRITIN  --  48  TIBC  --  283  IRON  --  86  RETICCTPCT 2.5  --    Urine analysis:    Component Value Date/Time   COLORURINE YELLOW 04/17/2019 0051   APPEARANCEUR CLEAR 04/17/2019 0051   LABSPEC 1.005 04/17/2019 0051   PHURINE 7.0 04/17/2019 0051   GLUCOSEU NEGATIVE 04/17/2019 0051   HGBUR SMALL (A) 04/17/2019 0051   BILIRUBINUR neg 08/19/2019 [place] 04/17/2019 0051   PROTEINUR Negative 08/19/2019 1018   PROTEINUR NEGATIVE 04/17/2019 0051   UROBILINOGEN 1.0 08/19/2019 1018   UROBILINOGEN 0.2 12/12/2012 0920   NITRITE neg 08/19/2019 1018   NITRITE NEGATIVE 04/17/2019 0051   LEUKOCYTESUR Negative 08/19/2019 1018   LEUKOCYTESUR NEGATIVE 04/17/2019 0051   Sepsis Labs: @LABRCNTIP (procalcitonin:4,lacticidven:4) ) Recent Results (from the past 240 hour(s))  Resp Panel by RT-PCR (Flu A&B, Covid) Nasopharyngeal Swab     Status: None   Collection Time: 06/21/2020  7:29 PM   Specimen: Nasopharyngeal Swab; Nasopharyngeal(NP) swabs in  vial transport medium  Result Value Ref Range Status   SARS Coronavirus 2 by RT PCR NEGATIVE NEGATIVE Final    Comment: (NOTE) SARS-CoV-2 target nucleic acids are NOT DETECTED.  The SARS-CoV-2 RNA is generally detectable in upper respiratory specimens during the acute phase of infection. The lowest concentration of SARS-CoV-2 viral copies this assay can detect is 138 copies/mL. A negative result does not preclude SARS-Cov-2 infection and should not be used as the sole basis for treatment or other patient management decisions. A negative result may occur with  improper specimen collection/handling, submission of specimen other than nasopharyngeal swab, presence of viral mutation(s) within the areas targeted by this assay, and inadequate number of viral copies(<138 copies/mL). A negative result must be combined with clinical observations, patient history, and epidemiological information. The expected result is Negative.  Fact Sheet for Patients:  EntrepreneurPulse.com.au  Fact Sheet for Healthcare Providers:  IncredibleEmployment.be  This test is no t yet approved or cleared by the [place] FDA and  has been authorized for detection and/or diagnosis of SARS-CoV-2 by FDA under an Emergency Use Authorization (EUA). This EUA will remain  in effect (meaning this test can be used) for the duration of the COVID-19 declaration under Section 564(b)(1) of the Act, 21 U.S.C.section 360bbb-3(b)(1), unless the authorization is terminated  or revoked sooner.       Influenza A by PCR NEGATIVE NEGATIVE Final   Influenza B by PCR NEGATIVE NEGATIVE Final    Comment: (NOTE) The Xpert Xpress SARS-CoV-2/FLU/RSV plus assay is intended as an aid in the diagnosis of influenza from Nasopharyngeal swab  specimens and should not be used as a sole basis for treatment. Nasal washings and aspirates are unacceptable for Xpert Xpress SARS-CoV-2/FLU/RSV testing.  Fact  Sheet for Patients: EntrepreneurPulse.com.au  Fact Sheet for Healthcare Providers: IncredibleEmployment.be  This test is not yet approved or cleared by the [place] FDA and has been authorized for detection and/or diagnosis of SARS-CoV-2 by FDA under an Emergency Use Authorization (EUA). This EUA will remain in effect (meaning this test can be used) for the duration of the COVID-19 declaration under Section 564(b)(1) of the Act, 21 U.S.C. section 360bbb-3(b)(1), unless the authorization is terminated or revoked.  Performed at [facility], [place] [address], [place],  [address]      Radiological Exams on Admission: No results found.  EKG: Independently reviewed. It shows normal sinus rhythm with a rate of 88, no significant ST changes  Assessment/Plan Principal Problem:   GI bleed Active Problems:   Coronary artery disease   COPD (chronic obstructive pulmonary disease) ([facility])   Obstructive sleep apnea   ILD (interstitial lung disease) ([facility])   History of GI bleed   Hypotension   Hypothyroidism   Hypertension associated with diabetes ([place])   Gastroesophageal reflux disease   Liver cirrhosis secondary to NASH ([facility])   Paroxysmal atrial fibrillation ([facility])   Diabetes ([place])   Syncope and collapse   Hyponatremia     #1 syncope: Secondary to GI bleed. Patient still dizzy. Appears she is still having active GI bleed. Will address the GI bleed hydrate and monitor  #2 upper GI bleed: Most likely decompensated cirrhosis with varices.  Patient vomited 200 cc of bright red blood now.  Initiate IV Protonix initiated serial CBCs. GI consulted and will likely need emergent EGD tonight. Octreotide given in the ER. Transfuse if hemoglobin drops  #3 hypotension: Secondary to GI bleed. Patient has known history of CHF but we will use fluids and blood transfusion when needed.  #4 COPD: No exacerbation.  #5 coronary artery disease: Avoid  demand ischemia. Will transfuse promptly if hemoglobin drops.  #6 diabetes: Initiate sliding scale insulin.  #7 hyponatremia: Probably due to liver cirrhosis. Continue monitoring  #8 nonalcoholic cirrhosis: Followed by GI.   DVT prophylaxis: SCD Code Status: Full code Family Communication: Son at bedside Disposition Plan: Home Consults called: Dr. Alessandra Bevels, GI Admission status: Inpatient  Severity of Illness: The appropriate patient status for this patient is INPATIENT. Inpatient status is judged to be reasonable and necessary in order to provide the required intensity of service to ensure the patient's safety. The patient's presenting symptoms, physical exam findings, and initial radiographic and laboratory data in the context of their chronic comorbidities is felt to place them at high risk for further clinical deterioration. Furthermore, it is not anticipated that the patient will be medically stable for discharge from the hospital within 2 midnights of admission. The following factors support the patient status of inpatient.   " The patient's presenting symptoms include vomiting blood and passing out. " The worrisome physical exam findings include tachycardia and hypotension. " The initial radiographic and laboratory data are worrisome because of evidence of GI bleed. " The chronic co-morbidities include nonalcoholic cirrhosis.   * I certify that at the point of admission it is my clinical judgment that the patient will require inpatient hospital care spanning beyond 2 midnights from the point of admission due to high intensity of service, high risk for further deterioration and high frequency of surveillance required.Barbette Merino MD  [facility] Pager [phone]  If 7PM-7AM, please contact night-coverage [email or web address] Password [facility]  06/24/2020, 9:59 PM

## 2020-07-13 NOTE — ED Triage Notes (Addendum)
Per ems pt was walking to go to the bathroom and had a syncopal episode. Pt also vomited up blood per ems. Pt c/o weakness and dizziness when she stands up. Pt has hx of GI bleeds. Pt alert and oriented x4. Pt denies abdominal pain.

## 2020-07-13 NOTE — Consult Note (Addendum)
Referring Provider:  EDP Primary Care Physician:  System, Provider Not In Primary Gastroenterologist:  [facility]  Reason for Consultation: Upper GI bleed  HPI: [name] is a 86 y.o. female with past medical history of nonalcoholic cirrhosis, history of esophageal varices based on last EGD in 2018 presented to the hospital with syncopal episodes as well as with hematemesis.  Patient started noticing hematemesis today.  She has been having dark stool but she is also taking iron supplements.  She denies seeing any black tarry stools.  While in the emergency room, she had another episode of large hematemesis.  She dropped her blood pressure.  Currently on pressure support.  GI is consulted for urgent consult.  Patient seen and examined at bedside.  Discussed with family at bedside.  Past Medical History:  Diagnosis Date  . Cirrhosis, non-alcoholic ([place])   . COPD (chronic obstructive pulmonary disease) ([place])   . Coronary artery disease    status post inferior wall myocardial infarction  . Diabetes mellitus   . Dyslipidemia   . Fall   . Hypotension 08/02/2010   recent episodes of orthostatic hypotension  . Hypothyroidism   . Leg edema   . Legally blind    left eye, partial blindness right eye per daughter  . Myocardial infarct, old   . Neuropathy of hand    left hand numbness/neuropathy  . Obesity   . Sleep apnea     Past Surgical History:  Procedure Laterality Date  . ABDOMINAL HYSTERECTOMY    . CARDIAC CATHETERIZATION     The ejection fraction is around 50%  . CHOLECYSTECTOMY    . CORONARY STENT PLACEMENT    . ESOPHAGOGASTRODUODENOSCOPY N/A 12/12/2012   Procedure: ESOPHAGOGASTRODUODENOSCOPY (EGD);  Surgeon: [name], MD;  Location: [name];  Service: Endoscopy;  Laterality: N/A;  . FLEXIBLE SIGMOIDOSCOPY N/A 12/12/2012   Procedure: FLEXIBLE SIGMOIDOSCOPY;  Surgeon: [name], MD;  Location: [facility];  Service: Endoscopy;  Laterality: N/A;  . IR  PARACENTESIS  09/29/2018  . IR PARACENTESIS  10/01/2018  . TUBAL LIGATION    . WRIST SURGERY      Prior to Admission medications   Medication Sig Start Date End Date Taking? Authorizing Provider  albuterol (VENTOLIN HFA) 108 (90 Base) MCG/ACT inhaler INHALE 2 PUFFS BY MOUTH EVERY 6 HOURS AS NEEDED FOR WHEEZING OR SHORTNESS OF BREATH Patient taking differently: Inhale 2 puffs into the lungs every 6 (six) hours as needed for wheezing or shortness of breath.  08/26/19  Yes Opalski, Deborah, DO  brimonidine (ALPHAGAN) 0.2 % ophthalmic solution Place 1 drop into both eyes daily. 03/04/19  Yes [provider]  Cholecalciferol (VITAMIN D3) 50 MCG (2000 UT) TABS Take 2,000 Units by mouth daily.    Yes [provider]  diltiazem (CARDIZEM CD) 120 MG 24 hr capsule Take 1 capsule (120 mg total) by mouth daily. 05/19/20  Yes Nahser, Wonda Cheng, MD  escitalopram (LEXAPRO) 20 MG tablet Take 1 tablet (20 mg total) by mouth daily. 05/19/20  Yes Abonza, Maritza, PA-C  furosemide (LASIX) 40 MG tablet Take 1 tablet by mouth in the morning daily.  You may take additional dose in the afternoon for edema/swelling 11/12/19  Yes Nahser, Wonda Cheng, MD  Insulin Aspart FlexPen 100 UNIT/ML SOPN INJECT 20 UNITS SUBCUTANEOUSLY THREE TIMES A DAY BEFORE MEALS; ADJUST AND INCREASE ACCORDINGLY. Patient taking differently: Inject 15 Units into the skin in the morning and at bedtime.  11/19/19  Yes Mellody Dance, DO  insulin glargine (LANTUS) 100 UNIT/ML injection Inject 0.3 mLs (30 Units total) into the skin at bedtime. 05/19/20  Yes Abonza, Maritza, PA-C  levocetirizine (XYZAL) 5 MG tablet Take 1 tablet (5 mg total) by mouth every evening. During allergy season only 06/03/19  Yes Opalski, [name], DO  levothyroxine (SYNTHROID) 100 MCG tablet Take 1 tablet (100 mcg total) by mouth daily. 05/19/20  Yes Abonza, Maritza, PA-C  montelukast (SINGULAIR) 10 MG tablet Take 1 tablet (10 mg total) by mouth every evening. 05/19/20  Yes  Abonza, Maritza, PA-C  Multiple Vitamins-Minerals (VISION FORMULA/LUTEIN) TABS Take 1 tablet by mouth daily.   Yes [provider]  nitroGLYCERIN (NITROSTAT) 0.4 MG SL tablet PLACE 1 TABLET UNDER THE TONGUE EVERY 5 MINUTES AS NEEDED FOR CHEST PAIN Patient taking differently: Place 0.4 mg under the tongue every 5 (five) minutes as needed for chest pain.  11/12/19  Yes Nahser, Wonda Cheng, MD  pantoprazole (PROTONIX) 20 MG tablet Take 1 tablet (20 mg total) by mouth 2 (two) times daily. I po qam can add I po qpm prn 05/19/20  Yes Abonza, Maritza, PA-C  RESTASIS 0.05 % ophthalmic emulsion Place 1 drop into both eyes 2 (two) times daily.  12/31/18  Yes [provider]  rifaximin (XIFAXAN) 550 MG TABS tablet Take 550 mg by mouth 2 (two) times daily.   Yes [provider]  spironolactone (ALDACTONE) 100 MG tablet Take 0.5 tablets (50 mg total) by mouth in the morning. Patient taking differently: Take 50 mg by mouth in the morning. 1/2 tab on m, w, f 04/05/20  Yes Abonza, Maritza, PA-C  ursodiol (ACTIGALL) 500 MG tablet Take 500 mg by mouth in the morning and at bedtime.   Yes [provider]  Blood Glucose Monitoring Suppl (FREESTYLE LITE) DEVI Use to check blood sugars 2-3 times daily 01/07/17   Mellody Dance, DO  Continuous Blood Gluc Receiver (FREESTYLE LIBRE 14 DAY READER) DEVI USE TO MONITOR BLOOD SUGAR 3 TIMES DAILY 10/13/19   Opalski, [name], DO  Continuous Blood Gluc Sensor ([place]) MISC Use to monitor blood sugars TID 08/17/19   Opalski, [name], DO  isosorbide mononitrate (IMDUR) 30 MG 24 hr tablet Take 0.5 tablets (15 mg total) by mouth daily. Patient not taking: Reported on 07/06/2020 05/19/20   Nahser, Wonda Cheng, MD  Lancets [facility] DELICA PLUS [id number]) MISC USE TO CHECK BLOOD SUGAR THREE TIMES A DAY 11/12/19   Opalski, [name], DO  metoprolol tartrate (LOPRESSOR) 50 MG tablet Take 0.5 tablets (25 mg total) by mouth in the  morning. Patient not taking: Reported on 06/22/2020 05/19/20   Lorrene Reid, PA-C  UNIFINE PENTIPS 31G X 5 MM MISC USE TO INJECT INSULIN 5 TIMES A DAY 07/01/19   Opalski, Deborah, DO    Scheduled Meds: . sodium chloride   Intravenous Once  . fentaNYL (SUBLIMAZE) injection  100 mcg Intravenous Once  . insulin aspart  0-5 Units Subcutaneous QHS  . [START ON 07/09/2020] insulin aspart  0-9 Units Subcutaneous TID WC  . midazolam  4 mg Intravenous Once  . [START ON 07/17/2020] pantoprazole  40 mg Intravenous Q12H  . propofol  1 mg/kg Intravenous Once  . sodium chloride flush  3 mL Intravenous Q12H   Continuous Infusions: . sodium chloride    . lactated ringers    . norepinephrine (LEVOPHED) Adult infusion 3 mcg/min (07/18/2020 2302)  . octreotide  (SANDOSTATIN)    IV infusion 50 mcg/hr (06/29/2020 2151)  . pantoprozole (PROTONIX) infusion    .  pantoprazole (PROTONIX) 80 mg IVPB     PRN Meds:.ondansetron **OR** ondansetron (ZOFRAN) IV  Allergies as of 07/15/2020 - Review Complete 06/20/2020  Allergen Reaction Noted  . Ace inhibitors Cough 04/03/2011  . Benadryl [diphenhydramine hcl (sleep)] Other (See Comments) 03/13/2016  . Fexofenadine hcl Other (See Comments) 03/11/2019  . Penicillins Hives and Swelling   . Sulfonamide derivatives Swelling   . Sulfa antibiotics Other (See Comments) 03/24/2020    Family History  Problem Relation Age of Onset  . Emphysema Brother        non smoker  . Diabetes Brother   . Hyperlipidemia Brother   . Hypertension Brother   . Heart disease Father   . Heart attack Father   . Diabetes Father   . Hyperlipidemia Father   . Hypertension Father   . Diabetes Mother   . Diabetes Sister   . Hyperlipidemia Sister   . Hypertension Sister   . Heart attack Daughter   . Diabetes Daughter   . Diabetes Son   . Suicidality Son   . Alcohol abuse Son   . Alcohol abuse Maternal Uncle   . Heart attack Paternal Grandfather   . Diabetes Daughter   . Cancer  Son        melanoma  . Diabetes Son   . Diabetes Son     Social History   Socioeconomic History  . Marital status: Widowed    Spouse name: Not on file  . Number of children: 3  . Years of education: 3  . Highest education level: 12th grade  Occupational History  . Occupation: Retired  Tobacco Use  . Smoking status: Former Smoker    Quit date: 08/20/1973    Years since quitting: 46.9  . Smokeless tobacco: Never Used  Vaping Use  . Vaping Use: Never used  Substance and Sexual Activity  . Alcohol use: No  . Drug use: No  . Sexual activity: Not Currently  Other Topics Concern  . Not on file  Social History Narrative   Lives alone in one story home   Daughter [name] lives in [place] and she works as  [employer] for an internal medicine clinic   Son lives nearby - he has diabetes and wears a Solicitor   Social Determinants of Health   Financial Resource Strain: Low Risk   . Difficulty of Paying Living Expenses: Not hard at all  Food Insecurity: No Food Insecurity  . Worried About Charity fundraiser in the Last Year: Never true  . Ran Out of Food in the Last Year: Never true  Transportation Needs: No Transportation Needs  . Lack of Transportation (Medical): No  . Lack of Transportation (Non-Medical): No  Physical Activity: Inactive  . Days of Exercise per Week: 0 days  . Minutes of Exercise per Session: 0 min  Stress: No Stress Concern Present  . Feeling of Stress : Only a little  Social Connections: Moderately Integrated  . Frequency of Communication with Friends and Family: More than three times a week  . Frequency of Social Gatherings with Friends and Family: More than three times a week  . Attends Religious Services: More than 4 times per year  . Active Member of Clubs or Organizations: No  . Attends Archivist Meetings: More than 4 times per year  . Marital Status: Widowed  Intimate Partner Violence: Not At Risk  . Fear of Current or Ex-Partner:  No  . Emotionally Abused: No  . Physically  Abused: No  . Sexually Abused: No    Review of Systems: Review of Systems  Constitutional: Negative for chills and fever.  HENT: Negative for hearing loss and tinnitus.   Eyes: Negative for blurred vision and double vision.  Respiratory: Negative for cough and hemoptysis.   Cardiovascular: Negative for chest pain and palpitations.  Gastrointestinal: Positive for vomiting. Negative for abdominal pain, constipation, diarrhea, heartburn and nausea.  Genitourinary: Negative for dysuria and urgency.  Musculoskeletal: Negative for myalgias and neck pain.  Skin: Negative for itching and rash.  Neurological: Positive for dizziness and weakness. Negative for seizures.  Endo/Heme/Allergies: Does not bruise/bleed easily.  Psychiatric/Behavioral: Negative for substance abuse. The patient is nervous/anxious.     Physical Exam: Vital signs: Vitals:   07/04/2020 2200 07/15/2020 2230  BP: (!) 113/57 (!) 78/61  Pulse: (!) 110 94  Resp: 19 (!) 23  SpO2: 99% 98%     Physical Exam Vitals reviewed.  Constitutional:      General: She is not in acute distress.    Appearance: Normal appearance.  HENT:     Head: Normocephalic and atraumatic.     Nose: Nose normal.     Mouth/Throat:     Mouth: Mucous membranes are moist.     Pharynx: Oropharynx is clear.  Eyes:     General: No scleral icterus.    Extraocular Movements: Extraocular movements intact.  Cardiovascular:     Rate and Rhythm: Regular rhythm. Tachycardia present.     Heart sounds: No murmur heard.   Pulmonary:     Effort: Pulmonary effort is normal.  Abdominal:     General: Bowel sounds are normal. There is no distension.     Palpations: Abdomen is soft.     Tenderness: There is no abdominal tenderness. There is no guarding.  Musculoskeletal:        General: No swelling. Normal range of motion.     Cervical back: Normal range of motion and neck supple.     Right lower leg: No edema.      Left lower leg: No edema.  Neurological:     Mental Status: She is alert and oriented to person, place, and time.  Psychiatric:        Thought Content: Thought content normal.        Judgment: Judgment normal.     GI:  Lab Results: Recent Labs    07/01/2020 1929  WBC 11.2*  HGB 11.4*  HCT 35.7*  PLT 100*   BMET Recent Labs    06/29/2020 1929  NA 130*  K 3.8  CL 98  CO2 19*  GLUCOSE 183*  BUN 28*  CREATININE 0.93  CALCIUM 8.4*   LFT Recent Labs    07/12/2020 1929  PROT 5.5*  ALBUMIN 2.7*  AST 26  ALT 19  ALKPHOS 129*  BILITOT 1.0   PT/INR Recent Labs    06/24/2020 1929  LABPROT 14.6  INR 1.2     Studies/Results: No results found.  Impression/Plan: -Hematemesis in a patient with cirrhosis complicated by history of esophageal varices.  Most likely variceal bleed. -Hemorrhagic shock.  Currently on pressor support.  Recommendations ----------------------- -Case discussed with ER physician multiple times today.  Patient will need urgent EGD.  Unfortunately, anesthesia staff not available for another several hours.  We will do bedside EGD with moderate sedation. -She has been started on octreotide and Protonix drip -Start Rocephin -She will be intubated by ER physician for airway protection.  Patient is critically  ill with hypotension,tachycardia/ hemorrhagic shock requiring pressor support. Patient was seen as a stat consult.   Need for urgent EGD was discussed with patient as well as patient's son at bedside.  Risks (bleeding, infection, bowel perforation that could require surgery, sedation-related changes in cardiopulmonary systems), benefits (identification and possible treatment of source of symptoms, exclusion of certain causes of symptoms), and alternatives (watchful waiting, radiographic imaging studies, empiric medical treatment)  were explained to patient and family in detail and patient wishes to proceed.   LOS: 0 days   [name]  MD,  FACP 06/22/2020, 11:26 PM  Contact #  [phone]

## 2020-07-14 ENCOUNTER — Inpatient Hospital Stay (HOSPITAL_COMMUNITY): Payer: HMO | Admitting: Certified Registered Nurse Anesthetist

## 2020-07-14 ENCOUNTER — Encounter (HOSPITAL_COMMUNITY): Payer: Self-pay | Admitting: Pulmonary Disease

## 2020-07-14 ENCOUNTER — Inpatient Hospital Stay (HOSPITAL_COMMUNITY): Payer: HMO

## 2020-07-14 ENCOUNTER — Encounter (HOSPITAL_COMMUNITY): Admission: EM | Disposition: E | Payer: Self-pay | Source: Home / Self Care | Attending: Internal Medicine

## 2020-07-14 DIAGNOSIS — K920 Hematemesis: Secondary | ICD-10-CM

## 2020-07-14 DIAGNOSIS — R578 Other shock: Secondary | ICD-10-CM

## 2020-07-14 DIAGNOSIS — I8501 Esophageal varices with bleeding: Secondary | ICD-10-CM | POA: Diagnosis not present

## 2020-07-14 DIAGNOSIS — K921 Melena: Secondary | ICD-10-CM | POA: Diagnosis not present

## 2020-07-14 HISTORY — PX: RADIOLOGY WITH ANESTHESIA: SHX6223

## 2020-07-14 HISTORY — PX: IR PARACENTESIS: IMG2679

## 2020-07-14 HISTORY — PX: IR TIPS: IMG2295

## 2020-07-14 LAB — POCT I-STAT 7, (LYTES, BLD GAS, ICA,H+H)
Acid-base deficit: 5 mmol/L — ABNORMAL HIGH (ref 0.0–2.0)
Acid-base deficit: 8 mmol/L — ABNORMAL HIGH (ref 0.0–2.0)
Acid-base deficit: 7 mmol/L — ABNORMAL HIGH (ref 0.0–2.0)
Bicarbonate: 21.9 mmol/L (ref 20.0–28.0)
Bicarbonate: 18.4 mmol/L — ABNORMAL LOW (ref 20.0–28.0)
Bicarbonate: 19 mmol/L — ABNORMAL LOW (ref 20.0–28.0)
Calcium, Ion: 1.1 mmol/L — ABNORMAL LOW (ref 1.15–1.40)
Calcium, Ion: 1.19 mmol/L (ref 1.15–1.40)
Calcium, Ion: 1.17 mmol/L (ref 1.15–1.40)
HCT: 37 % (ref 36.0–46.0)
HCT: 37 % (ref 36.0–46.0)
HCT: 34 % — ABNORMAL LOW (ref 36.0–46.0)
Hemoglobin: 12.6 g/dL (ref 12.0–15.0)
Hemoglobin: 12.6 g/dL (ref 12.0–15.0)
Hemoglobin: 11.6 g/dL — ABNORMAL LOW (ref 12.0–15.0)
O2 Saturation: 100 %
O2 Saturation: 99 %
O2 Saturation: 98 %
Patient temperature: 86.7
Patient temperature: 99.8
Patient temperature: 97.9
Potassium: 5 mmol/L (ref 3.5–5.1)
Potassium: 4.3 mmol/L (ref 3.5–5.1)
Potassium: 4.1 mmol/L (ref 3.5–5.1)
Sodium: 131 mmol/L — ABNORMAL LOW (ref 135–145)
Sodium: 133 mmol/L — ABNORMAL LOW (ref 135–145)
Sodium: 137 mmol/L (ref 135–145)
TCO2: 23 mmol/L (ref 22–32)
TCO2: 20 mmol/L — ABNORMAL LOW (ref 22–32)
TCO2: 20 mmol/L — ABNORMAL LOW (ref 22–32)
pCO2 arterial: 33.7 mmHg (ref 32.0–48.0)
pCO2 arterial: 43.2 mmHg (ref 32.0–48.0)
pCO2 arterial: 36.8 mmHg (ref 32.0–48.0)
pH, Arterial: 7.387 (ref 7.350–7.450)
pH, Arterial: 7.24 — ABNORMAL LOW (ref 7.350–7.450)
pH, Arterial: 7.319 — ABNORMAL LOW (ref 7.350–7.450)
pO2, Arterial: 460 mmHg — ABNORMAL HIGH (ref 83.0–108.0)
pO2, Arterial: 180 mmHg — ABNORMAL HIGH (ref 83.0–108.0)
pO2, Arterial: 117 mmHg — ABNORMAL HIGH (ref 83.0–108.0)

## 2020-07-14 LAB — GLUCOSE, CAPILLARY
Glucose-Capillary: 258 mg/dL — ABNORMAL HIGH (ref 70–99)
Glucose-Capillary: 351 mg/dL — ABNORMAL HIGH (ref 70–99)
Glucose-Capillary: 319 mg/dL — ABNORMAL HIGH (ref 70–99)
Glucose-Capillary: 287 mg/dL — ABNORMAL HIGH (ref 70–99)
Glucose-Capillary: 271 mg/dL — ABNORMAL HIGH (ref 70–99)
Glucose-Capillary: 262 mg/dL — ABNORMAL HIGH (ref 70–99)

## 2020-07-14 LAB — CBC
HCT: 30 % — ABNORMAL LOW (ref 36.0–46.0)
HCT: 39.8 % (ref 36.0–46.0)
HCT: 38.7 % (ref 36.0–46.0)
HCT: 35.3 % — ABNORMAL LOW (ref 36.0–46.0)
Hemoglobin: 9.7 g/dL — ABNORMAL LOW (ref 12.0–15.0)
Hemoglobin: 13.1 g/dL (ref 12.0–15.0)
Hemoglobin: 12.7 g/dL (ref 12.0–15.0)
Hemoglobin: 11.8 g/dL — ABNORMAL LOW (ref 12.0–15.0)
MCH: 29.9 pg (ref 26.0–34.0)
MCH: 29.8 pg (ref 26.0–34.0)
MCH: 30.1 pg (ref 26.0–34.0)
MCH: 30.3 pg (ref 26.0–34.0)
MCHC: 32.3 g/dL (ref 30.0–36.0)
MCHC: 32.9 g/dL (ref 30.0–36.0)
MCHC: 32.8 g/dL (ref 30.0–36.0)
MCHC: 33.4 g/dL (ref 30.0–36.0)
MCV: 92.6 fL (ref 80.0–100.0)
MCV: 90.7 fL (ref 80.0–100.0)
MCV: 91.7 fL (ref 80.0–100.0)
MCV: 90.5 fL (ref 80.0–100.0)
Platelets: 156 10*3/uL (ref 150–400)
Platelets: 156 10*3/uL (ref 150–400)
Platelets: 169 10*3/uL (ref 150–400)
Platelets: 131 10*3/uL — ABNORMAL LOW (ref 150–400)
RBC: 3.24 MIL/uL — ABNORMAL LOW (ref 3.87–5.11)
RBC: 4.39 MIL/uL (ref 3.87–5.11)
RBC: 4.22 MIL/uL (ref 3.87–5.11)
RBC: 3.9 MIL/uL (ref 3.87–5.11)
RDW: 14.6 % (ref 11.5–15.5)
RDW: 14.7 % (ref 11.5–15.5)
RDW: 15.6 % — ABNORMAL HIGH (ref 11.5–15.5)
RDW: 15.5 % (ref 11.5–15.5)
WBC: 14.2 10*3/uL — ABNORMAL HIGH (ref 4.0–10.5)
WBC: 25.5 10*3/uL — ABNORMAL HIGH (ref 4.0–10.5)
WBC: 31.9 10*3/uL — ABNORMAL HIGH (ref 4.0–10.5)
WBC: 26.3 10*3/uL — ABNORMAL HIGH (ref 4.0–10.5)
nRBC: 0 % (ref 0.0–0.2)
nRBC: 0 % (ref 0.0–0.2)
nRBC: 0.1 % (ref 0.0–0.2)
nRBC: 0.1 % (ref 0.0–0.2)

## 2020-07-14 LAB — BASIC METABOLIC PANEL
Anion gap: 16 — ABNORMAL HIGH (ref 5–15)
Anion gap: 14 (ref 5–15)
BUN: 33 mg/dL — ABNORMAL HIGH (ref 8–23)
BUN: 36 mg/dL — ABNORMAL HIGH (ref 8–23)
CO2: 16 mmol/L — ABNORMAL LOW (ref 22–32)
CO2: 17 mmol/L — ABNORMAL LOW (ref 22–32)
Calcium: 8.4 mg/dL — ABNORMAL LOW (ref 8.9–10.3)
Calcium: 8.7 mg/dL — ABNORMAL LOW (ref 8.9–10.3)
Chloride: 99 mmol/L (ref 98–111)
Chloride: 105 mmol/L (ref 98–111)
Creatinine, Ser: 1.17 mg/dL — ABNORMAL HIGH (ref 0.44–1.00)
Creatinine, Ser: 1.37 mg/dL — ABNORMAL HIGH (ref 0.44–1.00)
GFR, Estimated: 46 mL/min — ABNORMAL LOW (ref >60)
GFR, Estimated: 38 mL/min — ABNORMAL LOW (ref >60)
Glucose, Bld: 393 mg/dL — ABNORMAL HIGH (ref 70–99)
Glucose, Bld: 333 mg/dL — ABNORMAL HIGH (ref 70–99)
Potassium: 4.8 mmol/L (ref 3.5–5.1)
Potassium: 4.4 mmol/L (ref 3.5–5.1)
Sodium: 131 mmol/L — ABNORMAL LOW (ref 135–145)
Sodium: 136 mmol/L (ref 135–145)

## 2020-07-14 LAB — HEMOGLOBIN A1C
Hgb A1c MFr Bld: 8.2 % — ABNORMAL HIGH (ref 4.8–5.6)
Mean Plasma Glucose: 188.64 mg/dL

## 2020-07-14 LAB — COMPREHENSIVE METABOLIC PANEL
ALT: 18 U/L (ref 0–44)
AST: 23 U/L (ref 15–41)
Albumin: 2.4 g/dL — ABNORMAL LOW (ref 3.5–5.0)
Alkaline Phosphatase: 113 U/L (ref 38–126)
Anion gap: 13 (ref 5–15)
BUN: 31 mg/dL — ABNORMAL HIGH (ref 8–23)
CO2: 19 mmol/L — ABNORMAL LOW (ref 22–32)
Calcium: 8.5 mg/dL — ABNORMAL LOW (ref 8.9–10.3)
Chloride: 100 mmol/L (ref 98–111)
Creatinine, Ser: 1.32 mg/dL — ABNORMAL HIGH (ref 0.44–1.00)
GFR, Estimated: 40 mL/min — ABNORMAL LOW (ref >60)
Glucose, Bld: 329 mg/dL — ABNORMAL HIGH (ref 70–99)
Potassium: 4.6 mmol/L (ref 3.5–5.1)
Sodium: 132 mmol/L — ABNORMAL LOW (ref 135–145)
Total Bilirubin: 1.4 mg/dL — ABNORMAL HIGH (ref 0.3–1.2)
Total Protein: 5 g/dL — ABNORMAL LOW (ref 6.5–8.1)

## 2020-07-14 LAB — MAGNESIUM: Magnesium: 1.7 mg/dL (ref 1.7–2.4)

## 2020-07-14 LAB — PREPARE RBC (CROSSMATCH)

## 2020-07-14 LAB — PROTIME-INR
INR: 1.5 — ABNORMAL HIGH (ref 0.8–1.2)
Prothrombin Time: 17.1 seconds — ABNORMAL HIGH (ref 11.4–15.2)

## 2020-07-14 LAB — MRSA PCR SCREENING: MRSA by PCR: NEGATIVE

## 2020-07-14 LAB — PHOSPHORUS: Phosphorus: 5 mg/dL — ABNORMAL HIGH (ref 2.5–4.6)

## 2020-07-14 LAB — TRIGLYCERIDES: Triglycerides: 218 mg/dL — ABNORMAL HIGH (ref <150)

## 2020-07-14 SURGERY — IR WITH ANESTHESIA
Anesthesia: General

## 2020-07-14 MED ORDER — ORAL CARE MOUTH RINSE
15.0000 mL | OROMUCOSAL | Status: DC
  Administered 2020-07-14 – 2020-07-20 (×61): 15 mL via OROMUCOSAL

## 2020-07-14 MED ORDER — CHLORHEXIDINE GLUCONATE 0.12% ORAL RINSE (MEDLINE KIT)
15.0000 mL | Freq: Two times a day (BID) | OROMUCOSAL | Status: DC
  Administered 2020-07-14 – 2020-07-19 (×12): 15 mL via OROMUCOSAL

## 2020-07-14 MED ORDER — CHLORHEXIDINE GLUCONATE CLOTH 2 % EX PADS
6.0000 | MEDICATED_PAD | Freq: Every day | CUTANEOUS | Status: DC
  Administered 2020-07-14 – 2020-07-19 (×7): 6 via TOPICAL

## 2020-07-14 MED ORDER — SODIUM CHLORIDE 0.9 % IV SOLN
2.0000 g | INTRAVENOUS | Status: AC
  Administered 2020-07-14 – 2020-07-18 (×5): 2 g via INTRAVENOUS
  Filled 2020-07-14 (×6): qty 20

## 2020-07-14 MED ORDER — VASOPRESSIN 20 UNIT/ML IV SOLN
INTRAVENOUS | Status: AC
  Filled 2020-07-14: qty 1

## 2020-07-14 MED ORDER — PROPOFOL 10 MG/ML IV BOLUS
INTRAVENOUS | Status: AC
  Filled 2020-07-14: qty 20

## 2020-07-14 MED ORDER — DOCUSATE SODIUM 50 MG/5ML PO LIQD: 100.0000 mg | Freq: Two times a day (BID) | ORAL | Status: DC

## 2020-07-14 MED ORDER — SODIUM CHLORIDE 0.9% FLUSH
10.0000 mL | Freq: Two times a day (BID) | INTRAVENOUS | Status: DC
  Administered 2020-07-14 – 2020-07-16 (×4): 10 mL
  Administered 2020-07-17: 20 mL
  Administered 2020-07-17 – 2020-07-20 (×5): 10 mL

## 2020-07-14 MED ORDER — NOREPINEPHRINE 16 MG/250ML-% IV SOLN
0.0000 ug/min | INTRAVENOUS | Status: DC
  Administered 2020-07-14: 22 ug/min via INTRAVENOUS
  Administered 2020-07-14: 28 ug/min via INTRAVENOUS
  Administered 2020-07-15: 16 ug/min via INTRAVENOUS
  Administered 2020-07-15: 27 ug/min via INTRAVENOUS
  Administered 2020-07-17: 9 ug/min via INTRAVENOUS
  Administered 2020-07-19: 4 ug/min via INTRAVENOUS
  Filled 2020-07-14 (×6): qty 250

## 2020-07-14 MED ORDER — ALBUMIN HUMAN 5 % IV SOLN: INTRAVENOUS | Status: DC | PRN

## 2020-07-14 MED ORDER — SODIUM CHLORIDE 0.9% IV SOLUTION: Freq: Once | INTRAVENOUS | Status: AC

## 2020-07-14 MED ORDER — PHENYLEPHRINE HCL-NACL 10-0.9 MG/250ML-% IV SOLN
INTRAVENOUS | Status: AC
  Filled 2020-07-14: qty 250

## 2020-07-14 MED ORDER — ROCURONIUM BROMIDE 10 MG/ML (PF) SYRINGE
PREFILLED_SYRINGE | INTRAVENOUS | Status: DC | PRN
  Administered 2020-07-14: 100 mg via INTRAVENOUS

## 2020-07-14 MED ORDER — NOREPINEPHRINE 4 MG/250ML-% IV SOLN: INTRAVENOUS | Status: DC | PRN

## 2020-07-14 MED ORDER — PROPOFOL 1000 MG/100ML IV EMUL
INTRAVENOUS | Status: DC | PRN
  Administered 2020-07-13: 30 ug/kg/min via INTRAVENOUS

## 2020-07-14 MED ORDER — FENTANYL CITRATE (PF) 100 MCG/2ML IJ SOLN
25.0000 ug | Freq: Once | INTRAMUSCULAR | Status: AC
  Administered 2020-07-14: 25 ug via INTRAVENOUS

## 2020-07-14 MED ORDER — INSULIN ASPART 100 UNIT/ML ~~LOC~~ SOLN
0.0000 [IU] | SUBCUTANEOUS | Status: DC
  Administered 2020-07-14: 11 [IU] via SUBCUTANEOUS
  Administered 2020-07-14: 15 [IU] via SUBCUTANEOUS
  Administered 2020-07-14 (×3): 8 [IU] via SUBCUTANEOUS
  Administered 2020-07-15: 11 [IU] via SUBCUTANEOUS
  Administered 2020-07-15: 5 [IU] via SUBCUTANEOUS
  Administered 2020-07-15: 11 [IU] via SUBCUTANEOUS
  Administered 2020-07-15 (×3): 5 [IU] via SUBCUTANEOUS
  Administered 2020-07-16: 15 [IU] via SUBCUTANEOUS

## 2020-07-14 MED ORDER — IOHEXOL 240 MG/ML SOLN
INTRAMUSCULAR | Status: AC
  Administered 2020-07-14: 80 mL via INTRAVENOUS
  Filled 2020-07-14: qty 100

## 2020-07-14 MED ORDER — SODIUM CHLORIDE 0.9% FLUSH: 10.0000 mL | INTRAVENOUS | Status: DC | PRN

## 2020-07-14 MED ORDER — SODIUM BICARBONATE 8.4 % IV SOLN
INTRAVENOUS | Status: DC | PRN
  Administered 2020-07-14 (×2): 50 meq via INTRAVENOUS

## 2020-07-14 MED ORDER — CALCIUM CHLORIDE 10 % IV SOLN
INTRAVENOUS | Status: DC | PRN
  Administered 2020-07-14: 500 mg via INTRAVENOUS

## 2020-07-14 MED ORDER — ETOMIDATE 2 MG/ML IV SOLN
INTRAVENOUS | Status: DC | PRN
  Administered 2020-07-13: 20 mg via INTRAVENOUS

## 2020-07-14 MED ORDER — VASOPRESSIN 20 UNITS/100 ML INFUSION FOR SHOCK
0.0000 [IU]/min | INTRAVENOUS | Status: DC
  Administered 2020-07-14 – 2020-07-15 (×4): 0.03 [IU]/min via INTRAVENOUS
  Administered 2020-07-16 – 2020-07-18 (×4): 0.02 [IU]/min via INTRAVENOUS
  Administered 2020-07-19: 0.03 [IU]/min via INTRAVENOUS
  Administered 2020-07-19: 0.02 [IU]/min via INTRAVENOUS
  Administered 2020-07-20: 0.03 [IU]/min via INTRAVENOUS
  Filled 2020-07-14 (×11): qty 100

## 2020-07-14 MED ORDER — FENTANYL 2500MCG IN NS 250ML (10MCG/ML) PREMIX INFUSION
25.0000 ug/h | INTRAVENOUS | Status: DC
  Administered 2020-07-14: 25 ug/h via INTRAVENOUS
  Filled 2020-07-14 (×2): qty 250

## 2020-07-14 MED ORDER — INSULIN GLARGINE 100 UNIT/ML ~~LOC~~ SOLN
20.0000 [IU] | Freq: Once | SUBCUTANEOUS | Status: AC
  Administered 2020-07-14: 20 [IU] via SUBCUTANEOUS
  Filled 2020-07-14: qty 0.2

## 2020-07-14 MED ORDER — ROCURONIUM BROMIDE 10 MG/ML (PF) SYRINGE
PREFILLED_SYRINGE | INTRAVENOUS | Status: AC
  Filled 2020-07-14: qty 10

## 2020-07-14 MED ORDER — LACTATED RINGERS IV SOLN: INTRAVENOUS | Status: DC | PRN

## 2020-07-14 MED ORDER — EPINEPHRINE PF 1 MG/ML IJ SOLN
INTRAMUSCULAR | Status: DC | PRN
  Administered 2020-07-14: 1 mg via INTRAVENOUS

## 2020-07-14 MED ORDER — STERILE WATER FOR INJECTION IV SOLN
INTRAVENOUS | Status: AC
  Filled 2020-07-14: qty 850

## 2020-07-14 MED ORDER — SUCCINYLCHOLINE CHLORIDE 20 MG/ML IJ SOLN
INTRAMUSCULAR | Status: DC | PRN
  Administered 2020-07-13: 100 mg via INTRAVENOUS

## 2020-07-14 MED ORDER — PROPOFOL 1000 MG/100ML IV EMUL
0.0000 ug/kg/min | INTRAVENOUS | Status: DC
  Administered 2020-07-14: 50 ug/kg/min via INTRAVENOUS
  Administered 2020-07-14: 35 ug/kg/min via INTRAVENOUS
  Administered 2020-07-14: 50 ug/kg/min via INTRAVENOUS
  Administered 2020-07-14: 30 ug/kg/min via INTRAVENOUS
  Administered 2020-07-15 (×2): 40 ug/kg/min via INTRAVENOUS
  Administered 2020-07-16: 50 ug/kg/min via INTRAVENOUS
  Administered 2020-07-16: 5 ug/kg/min via INTRAVENOUS
  Administered 2020-07-16 – 2020-07-17 (×4): 50 ug/kg/min via INTRAVENOUS
  Administered 2020-07-18: 45 ug/kg/min via INTRAVENOUS
  Administered 2020-07-18: 35 ug/kg/min via INTRAVENOUS
  Filled 2020-07-14 (×9): qty 100
  Filled 2020-07-14: qty 200
  Filled 2020-07-14 (×4): qty 100

## 2020-07-14 MED ORDER — CALCIUM GLUCONATE-NACL 1-0.675 GM/50ML-% IV SOLN
1.0000 g | Freq: Once | INTRAVENOUS | Status: AC
  Administered 2020-07-14: 1000 mg via INTRAVENOUS
  Filled 2020-07-14: qty 50

## 2020-07-14 MED ORDER — FENTANYL BOLUS VIA INFUSION
25.0000 ug | INTRAVENOUS | Status: DC | PRN
  Administered 2020-07-14 (×2): 25 ug via INTRAVENOUS
  Filled 2020-07-14: qty 25

## 2020-07-14 MED ORDER — SODIUM BICARBONATE 8.4 % IV SOLN
50.0000 meq | Freq: Once | INTRAVENOUS | Status: AC
  Administered 2020-07-14: 50 meq via INTRAVENOUS
  Filled 2020-07-14: qty 50

## 2020-07-14 MED ORDER — IOHEXOL 300 MG/ML  SOLN
100.0000 mL | Freq: Once | INTRAMUSCULAR | Status: AC | PRN
  Administered 2020-07-14: 100 mL via INTRAVENOUS

## 2020-07-14 MED ORDER — IOHEXOL 300 MG/ML  SOLN: 150.0000 mL | Freq: Once | INTRAMUSCULAR | Status: DC | PRN

## 2020-07-14 MED ORDER — FENTANYL CITRATE (PF) 250 MCG/5ML IJ SOLN
INTRAMUSCULAR | Status: AC
  Filled 2020-07-14: qty 5

## 2020-07-14 MED ORDER — VASOPRESSIN 20 UNITS/100 ML INFUSION FOR SHOCK
INTRAVENOUS | Status: DC | PRN
  Administered 2020-07-14: .03 [IU]/min via INTRAVENOUS

## 2020-07-14 MED ORDER — POLYETHYLENE GLYCOL 3350 17 G PO PACK: 17.0000 g | PACK | Freq: Every day | ORAL | Status: DC

## 2020-07-14 MED ORDER — PHENYLEPHRINE 40 MCG/ML (10ML) SYRINGE FOR IV PUSH (FOR BLOOD PRESSURE SUPPORT)
PREFILLED_SYRINGE | INTRAVENOUS | Status: AC
  Filled 2020-07-14: qty 10

## 2020-07-14 NOTE — Op Note (Signed)
[facility] Patient Name: [name] Procedure Date : 06/22/2020 MRN: [id number] Attending MD: [name] , MD Date of Birth: 1934-06-09 CSN: [id number] Age: 86 Admit Type: Inpatient Procedure:                Upper GI endoscopy Indications:              Cirrhosis with UGI bleeding suspected esophageal                            varices Providers:                [name], MD Referring MD:              Medicines:                Sedation Administered by ED staff Complications:            No immediate complications. Estimated Blood Loss:     Estimated blood loss was minimal. Procedure:                Pre-Anesthesia Assessment:                           - Prior to the procedure, a History and Physical                            was performed, and patient medications and                            allergies were reviewed. The patient's tolerance of                            previous anesthesia was also reviewed. The risks                            and benefits of the procedure and the sedation                            options and risks were discussed with the patient.                            All questions were answered, and informed consent                            was obtained. Prior Anticoagulants: The patient has                            taken no previous anticoagulant or antiplatelet                            agents. ASA Grade Assessment: III - A patient with                            severe systemic disease. After reviewing the risks  and benefits, the patient was deemed in                            satisfactory condition to undergo the procedure.                           After obtaining informed consent, the endoscope was                            passed under direct vision. Throughout the                            procedure, the patient's blood pressure, pulse, and                            oxygen saturations  were monitored continuously. The                            GIF-H190 ([id number]) Olympus gastroscope was                            introduced through the mouth, and advanced to the                            second part of duodenum. The upper GI endoscopy was                            performed with moderate difficulty due to the                            patient's agitation. Successful completion of the                            procedure was aided by increasing the dose of                            sedation medication. The patient tolerated the                            procedure well. Scope In: Scope Out: Findings:      There was evidence of fresh blood in the oropharynx as well as in the       esophagus. Large amount of fresh blood noted in the fundus along with       some blood clots. Blood clots limited visualization of fundus. There was       active bleeding varices at GE junction. 5 bands placed on esophageal       varices. Patient had ongoing bleeding at the end of procedure.      Two columns of large (> 5 mm) varices with stigmata of recent bleeding       were found in the mid esophagus and in the distal esophagus,. Red wale       signs were present. Five bands were successfully placed with incomplete       eradication of varices.  Red blood was found in the gastric fundus and in the prepyloric region       of the stomach.      Multiple sessile polyps were found in the cardia and in the gastric body.      The duodenal bulb, first portion of the duodenum and second portion of       the duodenum were normal. Impression:               - Large (> 5 mm) esophageal varices with stigmata                            of recent bleeding. Incompletely eradicated. Banded.                           - Red blood in the prepyloric region of the stomach                            and in the gastric fundus.                           - Multiple gastric polyps.                           -  Normal duodenal bulb, first portion of the                            duodenum and second portion of the duodenum.                           - No specimens collected. Recommendation:           - Return patient to ICU for ongoing care.                           - NPO.                           - Continue present medications.                           - Refer to an interventional radiologist today. Procedure Code(s):        --- Professional ---                           [phone], Esophagogastroduodenoscopy, flexible,                            transoral; with band ligation of esophageal/gastric                            varices Diagnosis Code(s):        --- Professional ---                           K74.60, Unspecified cirrhosis of liver  I85.11, Secondary esophageal varices with bleeding                           K31.7, Polyp of stomach and duodenum CPT copyright 2019 American Medical Association. All rights reserved. The codes documented in this report are preliminary and upon coder review may  be revised to meet current compliance requirements. [name], MD [name], MD 07/08/2020 1:25:28 AM Number of Addenda: 0

## 2020-07-14 NOTE — Code Documentation (Addendum)
Patient pale and hypotensive. Patient continuing to vomit blood despite the Zofran given. Provider at bedside.

## 2020-07-14 NOTE — Progress Notes (Signed)
Bedside EGD completed with Dr Alessandra Bevels in emergency room, Endo rn and tech at bedside, ER RN, Daija remained in room during procedure, monitoring propofol gtt, see mar for meds given by er rn, see flowsheet for VS, safety maintained

## 2020-07-14 NOTE — Code Documentation (Signed)
38mg Bolus of propofol given by BIllene SilverRN  Per MSabra HeckMD

## 2020-07-14 NOTE — Progress Notes (Signed)
More hematemesis, pressor requirements rapidly escalating. Unfortunately probably TIPS is only option. [place] tube placed as temporizing measure. Daughter updated. Appreciate GI/IR help. Guarded prognosis  Additional 55 minutes critical care time not including any separately billable procedures.  Erskine Emery MD PCCM

## 2020-07-14 NOTE — Progress Notes (Signed)
Patient transported on vent to IR.  Vent placed on standby as the CRNA connected patient to the IR vent.

## 2020-07-14 NOTE — Transfer of Care (Signed)
Immediate Anesthesia Transfer of Care Note  Patient: [name]  Procedure(s) Performed: IR WITH ANESTHESIA- TIPPS (N/A )  Patient Location: ICU  Anesthesia Type:General  Level of Consciousness: sedated and Patient remains intubated per anesthesia plan  Airway & Oxygen Therapy: Patient remains intubated per anesthesia plan and Patient placed on Ventilator (see vital sign flow sheet for setting)  Post-op Assessment: Report given to RN, Post -op Vital signs reviewed and stable and remains on pressor support.  Post vital signs: Reviewed and stable  Last Vitals:  Vitals Value Taken Time  BP 107/43 07/15/2020 1428  Temp    Pulse 107 06/30/2020 1433  Resp 26 07/06/2020 1433  SpO2 100 % 06/29/2020 1433  Vitals shown include unvalidated device data.  Last Pain:  Vitals:   07/13/2020 1036  TempSrc: Axillary         Complications: No complications documented.

## 2020-07-14 NOTE — Sedation Documentation (Signed)
Pt went into vtach with pulse at 1342.  HR >200 SBP 50's and dropiing.  Compressions started by Dr Serafina Royals, pads placed. 32m epi given by CRNA at 1343.  1343 HR 146, BP 204/108, cardiac rhythm afi b vs sinus tach arrhythmia.  Dr STamala Julianand Dr HVernard Gambles at bedside.

## 2020-07-14 NOTE — Procedures (Signed)
  Procedure: TIPS, Paracentesis   Operators:  Vernard Gambles, [place] EBL:   minimal Complications:  Transient arrhythmia and hypotension, responded to interventions  See full dictation in [employer].  Dillard Cannon MD Main # [phone] Pager  [phone] Mobile [phone]

## 2020-07-14 NOTE — Progress Notes (Signed)
eLink Physician-Brief Progress Note Patient Name: [name] DOB: 1934/03/10 MRN: [id number]   Date of Service  06/22/2020  HPI/Events of Note  86 yr old woman with GI bleed. Admitted by PCCM. Is on Levophed and getting serial CBC. RN requested sedation orders , agitated while on propofol. Also requested foley and restraints.   eICU Interventions  Propofol, fentanyl , foley cath, restraints ordered Please call for assistance     Intervention Category Major Interventions: Hemorrhage - evaluation and management;Respiratory failure - evaluation and management;Other:  Margaretmary Lombard 07/06/2020, 2:03 AM

## 2020-07-14 NOTE — Procedures (Signed)
[place] tube placement  Indication: refractory esophageal varux bleed  Consent: emergent  Sedation: in place for mechanical ventilation  Procedure description:  Patient placed upright, all balloons checked for patency and deflated.  [place] tube advance into stomach with 50 cc air placed into gastric balloon.  Placement confirmed on KUB then additional 200cc of air placed into gastric cuff.  Tube then placed in traction.  Complications: none immediate  Patient to IR for emergent TIPS

## 2020-07-14 NOTE — Code Documentation (Signed)
Patient calling out that she is nauseous. Patient vomiting 200cc of bright red blood. Provider notified.

## 2020-07-14 NOTE — Progress Notes (Signed)
RT note. Pt. Transported from ED to 35M Rm3 without any complications.

## 2020-07-14 NOTE — Sedation Documentation (Addendum)
PRE TIPS Pressures  PV pressure mean 45 RA pressure 25/18 (21)

## 2020-07-14 NOTE — Anesthesia Preprocedure Evaluation (Addendum)
Anesthesia Evaluation  Patient identified by MRN, date of birth, ID band Patient unresponsive    Reviewed: Allergy & Precautions, Patient's Chart, lab work & pertinent test results, Unable to perform ROS - Chart review onlyPreop documentation limited or incomplete due to emergent nature of procedure.  History of Anesthesia Complications Negative for: history of anesthetic complications  Airway Mallampati: Intubated       Dental   Pulmonary sleep apnea , COPD, former smoker,     + decreased breath sounds      Cardiovascular hypertension, Pt. on medications + Past MI and +CHF   Rhythm:Regular Rate:Tachycardia  1. Left ventricular ejection fraction, by estimation, is 55 to 60%. The  left ventricle has normal function. The left ventricle has no regional  wall motion abnormalities. Unable to assess LV diastolic filling due ot  underlying atrial fibrillation.  2. Right ventricular systolic function is normal. The right ventricular  size is mildly enlarged.  3. The mitral valve is degenerative. There is mild thickening of the  mitral valve leaflet(s). There is mild calcification of the mitral valve  leaflet(s). Normal mobility of the mitral valve leaflets. Moderate mitral  annular calcification. Mild mitral  valve regurgitation. Mild mitral valve regurgitation. No evidence of  mitral stenosis.  4. The aortic valve is normal in structure and function. Aortic valve  regurgitation is not visualized. No aortic stenosis is present.    Neuro/Psych PSYCHIATRIC DISORDERS Depression  Neuromuscular disease    GI/Hepatic GERD  ,(+) Cirrhosis   Esophageal Varices    , Variceal bleed   Endo/Other  diabetesHypothyroidism   Renal/GU Renal InsufficiencyRenal diseaseLab Results      Component                Value               Date                      CREATININE               1.17 (H)            06/20/2020                 Musculoskeletal   Abdominal   Peds  Hematology  (+) Blood dyscrasia, anemia , Lab Results      Component                Value               Date                      WBC                      31.9 (H)            06/22/2020                HGB                      12.6                06/20/2020                HCT                      37.0  07/10/2020                MCV                      91.7                07/09/2020                PLT                      169                 06/22/2020              Anesthesia Other Findings   Reproductive/Obstetrics                            Anesthesia Physical Anesthesia Plan  ASA: IV and emergent  Anesthesia Plan: General   Post-op Pain Management:    Induction: Intravenous, Rapid sequence and Cricoid pressure planned  PONV Risk Score and Plan: 3 and Ondansetron and Dexamethasone  Airway Management Planned: Oral ETT  Additional Equipment: Arterial line, CVP and Ultrasound Guidance Line Placement  Intra-op Plan:   Post-operative Plan: Possible Post-op intubation/ventilation  Informed Consent: I have reviewed the patients History and Physical, chart, labs and discussed the procedure including the risks, benefits and alternatives for the proposed anesthesia with the patient or authorized representative who has indicated his/her understanding and acceptance.     Dental advisory given  Plan Discussed with: CRNA and Surgeon  Anesthesia Plan Comments:         Anesthesia Quick Evaluation

## 2020-07-14 NOTE — Consult Note (Signed)
Chief Complaint: Patient was seen in consultation today for  Chief Complaint  Patient presents with   Loss of Consciousness   Hematemesis   at the request of [name], MD  Referring Physician(s): [name], MD  Supervising Physician: [name]  Patient Status: [name] - In-pt  History of Present Illness: [name] is a 86 y.o. female history of [name] cirrhosis and varices, presented yesterday to the ED with syncope, hematemesis GI bleed.  Patient was intubated and had a   EGD.  Endoscopy revealed active variceal bleeding at the GE junction with banding x5, large amount of blood noted in the fundus as well such that gastric varices cannot be confidently excluded.  Patient currently on octreotide drip.  Past medical history significant for diabetes, coronary artery disease.  She had been diagnosed with esophageal varices on EGD in 2016 as well as portal hypertensive gastropathy.  Not currently on any blood thinners. Currently no active hematemesis or hematochezia; hypotensive on pressors.  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   Myocardial infarct, old    Neuropathy of hand    left hand numbness/neuropathy   Obesity    Sleep apnea     Past Surgical History:  Procedure Laterality Date   ABDOMINAL HYSTERECTOMY     CARDIAC CATHETERIZATION     The ejection fraction is around 50%   CHOLECYSTECTOMY     CORONARY STENT PLACEMENT     ESOPHAGOGASTRODUODENOSCOPY N/A 12/12/2012   Procedure: ESOPHAGOGASTRODUODENOSCOPY (EGD);  Surgeon: [name], MD;  Location: [name];  Service: Endoscopy;  Laterality: N/A;   FLEXIBLE SIGMOIDOSCOPY N/A 12/12/2012   Procedure: FLEXIBLE SIGMOIDOSCOPY;  Surgeon: [name], MD;  Location: [facility];  Service: Endoscopy;  Laterality: N/A;   IR PARACENTESIS  09/29/2018   IR PARACENTESIS  10/01/2018   TUBAL LIGATION     WRIST SURGERY      Allergies: Ace inhibitors, Benadryl [diphenhydramine hcl (sleep)], Fexofenadine hcl, Penicillins, Sulfonamide derivatives, and Sulfa antibiotics  Medications: Prior to Admission medications   Medication Sig Start Date End Date Taking? Authorizing Provider  albuterol (VENTOLIN HFA) 108 (90 Base) MCG/ACT inhaler INHALE 2 PUFFS BY MOUTH EVERY 6 HOURS AS NEEDED FOR WHEEZING OR SHORTNESS OF BREATH Patient taking differently: Inhale 2 puffs into the lungs every 6 (six) hours as needed for wheezing or shortness of breath.  08/26/19  Yes Opalski, Deborah, DO  brimonidine (ALPHAGAN) 0.2 % ophthalmic solution Place 1 drop into both eyes daily. 03/04/19  Yes [provider]  Cholecalciferol (VITAMIN D3) 50 MCG (2000 UT) TABS Take 2,000 Units by mouth daily.    Yes [provider]  diltiazem (CARDIZEM CD) 120 MG 24 hr capsule Take 1 capsule (120 mg total) by mouth daily. 05/19/20  Yes Nahser, Wonda Cheng, MD  escitalopram (LEXAPRO) 20 MG tablet Take 1 tablet (20 mg total) by mouth daily. 05/19/20  Yes Abonza, Maritza, PA-C  furosemide (LASIX) 40 MG tablet Take 1 tablet by mouth in the morning daily.  You may take additional dose in the  afternoon for edema/swelling 11/12/19  Yes Nahser, Wonda Cheng, MD  Insulin Aspart FlexPen 100 UNIT/ML SOPN INJECT 20 UNITS SUBCUTANEOUSLY THREE TIMES A DAY BEFORE MEALS; ADJUST AND INCREASE ACCORDINGLY. Patient taking differently: Inject 15 Units into the skin in the morning and at bedtime.  11/19/19  Yes Opalski, [name], DO  insulin glargine (LANTUS) 100 UNIT/ML injection Inject 0.3 mLs (30 Units total) into the skin at bedtime. 05/19/20  Yes Abonza,  Maritza, PA-C  levocetirizine (XYZAL) 5 MG tablet Take 1 tablet (5 mg total) by mouth every evening. During allergy season only 06/03/19  Yes Opalski, [name], DO  levothyroxine (SYNTHROID) 100 MCG tablet Take 1 tablet (100 mcg total) by mouth daily. 05/19/20  Yes Abonza, Maritza, PA-C  montelukast (SINGULAIR) 10 MG tablet Take 1 tablet (10 mg total) by mouth every evening. 05/19/20  Yes Abonza, Maritza, PA-C  Multiple Vitamins-Minerals (VISION FORMULA/LUTEIN) TABS Take 1 tablet by mouth daily.   Yes [provider]  nitroGLYCERIN (NITROSTAT) 0.4 MG SL tablet PLACE 1 TABLET UNDER THE TONGUE EVERY 5 MINUTES AS NEEDED FOR CHEST PAIN Patient taking differently: Place 0.4 mg under the tongue every 5 (five) minutes as needed for chest pain.  11/12/19  Yes Nahser, Wonda Cheng, MD  pantoprazole (PROTONIX) 20 MG tablet Take 1 tablet (20 mg total) by mouth 2 (two) times daily. I po qam can add I po qpm prn 05/19/20  Yes Abonza, Maritza, PA-C  RESTASIS 0.05 % ophthalmic emulsion Place 1 drop into both eyes 2 (two) times daily.  12/31/18  Yes [provider]  rifaximin (XIFAXAN) 550 MG TABS tablet Take 550 mg by mouth 2 (two) times daily.   Yes [provider]  spironolactone (ALDACTONE) 100 MG tablet Take 0.5 tablets (50 mg total) by mouth in the morning. Patient taking differently: Take 50 mg by mouth in the morning. 1/2 tab on m, w, f 04/05/20  Yes Abonza, Maritza, PA-C  ursodiol (ACTIGALL) 500 MG tablet Take 500 mg by mouth in the morning and at bedtime.   Yes [provider]  Blood Glucose Monitoring Suppl (FREESTYLE LITE) DEVI Use to check blood sugars 2-3 times daily 01/07/17   Opalski, [name], DO  Continuous Blood Gluc Receiver (FREESTYLE LIBRE 56 DAY READER) [place] USE TO MONITOR BLOOD SUGAR 3 TIMES DAILY 10/13/19   Opalski, [name], DO  Continuous Blood Gluc Sensor ([place]) MISC Use to monitor blood sugars TID 08/17/19   Opalski, [name], DO  Lancets  (ONETOUCH DELICA PLUS [id number]) MISC USE TO CHECK BLOOD SUGAR THREE TIMES A DAY 11/12/19   Opalski, Deborah, DO  UNIFINE PENTIPS 31G X 5 MM MISC USE TO INJECT INSULIN 5 TIMES A DAY 07/01/19   Mellody Dance, DO     Family History  Problem Relation Age of Onset   Emphysema Brother        non smoker   Diabetes Brother    Hyperlipidemia Brother    Hypertension Brother    Heart disease Father    Heart attack Father    Diabetes Father    Hyperlipidemia Father    Hypertension Father    Diabetes Mother    Diabetes Sister    Hyperlipidemia Sister    Hypertension Sister    Heart attack Daughter    Diabetes Daughter    Diabetes Son    Suicidality Son    Alcohol abuse Son    Alcohol abuse Maternal Uncle    Heart attack Paternal Grandfather    Diabetes Daughter  Cancer Son        melanoma   Diabetes Son    Diabetes Son     Social History   Socioeconomic History   Marital status: Widowed    Spouse name: Not on file   Number of children: 3   Years of education: 95   Highest education level: 12th grade  Occupational History   Occupation: Retired  Tobacco Use   Smoking status: Former Smoker    Quit date: 08/20/1973    Years since quitting: 46.9   Smokeless tobacco: Never Used  Vaping Use   Vaping Use: Never used  Substance and Sexual Activity   Alcohol use: No   Drug use: No   Sexual activity: Not Currently  Other Topics Concern   Not on file  Social History Narrative   Lives alone in one story home   Daughter [name] lives in [place] and she works as  [employer] for an internal medicine clinic   Son lives nearby - he has diabetes and wears a Solicitor   Social Determinants of Health   Financial Resource Strain: Low Risk    Difficulty of Paying Living Expenses: Not hard at all  Food Insecurity: No Food Insecurity   Worried About Charity fundraiser in the Last Year: Never true   Arboriculturist in the Last Year: Never true  Transportation Needs: No  Transportation Needs   Lack of Transportation (Medical): No   Lack of Transportation (Non-Medical): No  Physical Activity: Inactive   Days of Exercise per Week: 0 days   Minutes of Exercise per Session: 0 min  Stress: No Stress Concern Present   Feeling of Stress : Only a little  Social Connections: Moderately Integrated   Frequency of Communication with Friends and Family: More than three times a week   Frequency of Social Gatherings with Friends and Family: More than three times a week   Attends Religious Services: More than 4 times per year   Active Member of Clubs or Organizations: No   Attends Music therapist: More than 4 times per year   Marital Status: Widowed    ECOG Status: 4 - Bedbound  Review of Systems: A 12 point ROS discussed and pertinent positives are indicated in the HPI above.  All other systems are negative.  Review of Systems  Vital Signs: BP (!) 102/47   Pulse 90   Temp (!) 86.7 F (30.4 C)   Resp 20   Ht 5' (1.524 m)   Wt 72.5 kg   SpO2 100%   BMI 31.22 kg/m   Physical Exam Constitutional: Sedated, intubated. Well-developed and well-nourished. No evident distress.  Last Weight  Most recent update: 06/23/2020  2:00 AM    Weight  72.5 kg (159 lb 13.3 oz)            HENT:  Head: Normocephalic and atraumatic.   Eyes: Closed, sedated. Right eye exhibits no discharge. Left eye exhibits no discharge. No scleral icterus.  Neck: No JVD present.  Pulmonary/Chest: Intubated on vent Abdomen: soft, non distended Neurological:  deferred Skin: Skin is warm and dry.  not diaphoretic.     Imaging: DG Chest Port 1 View  Result Date: 06/20/2020 CLINICAL DATA:  Post intubation EXAM: PORTABLE CHEST 1 VIEW COMPARISON:  10/18/2019 FINDINGS: An endotracheal tube has been placed with tip measuring 2.8 cm above the carina. Shallow inspiration. Heart size and pulmonary vascularity are normal. Lungs are clear. No pleural effusions.  No pneumothorax.  Mediastinal contours appear intact. Calcification of the aorta. Degenerative changes in the spine and shoulders. IMPRESSION: Endotracheal tube tip measures 2.8 cm above the carina. No evidence of active pulmonary disease. Electronically Signed   By: Lucienne Capers M.D.   On: 07/12/2020 00:10   DUPLEX ULTRASOUND OF LIVER  TECHNIQUE: Color and duplex Doppler ultrasound was performed to evaluate the hepatic in-flow and out-flow vessels.  COMPARISON: 03/07/2020 and previous  FINDINGS: Liver: Nodular contour. No focal lesion or biliary ductal dilatation.  Main Portal Vein size: 0.9 cm  Portal Vein Velocities  Main Prox: 23 cm/sec  Main Mid: 35 cm/sec  Main Dist: 34 cm/sec Right: 39 cm/sec Left: 25 cm/sec  Hepatic Vein Velocities  Right: 25 cm/sec  Middle: 44 cm/sec  Left: 27 cm/sec  IVC: Patent, velocity 46 cm/sec  Hepatic Artery Velocity: 180 cm/sec  Splenic Vein Velocity: 43 cm/sec  Spleen: 8.2 cm x 11.4 cm x 11.4 cm with a total volume of 555 cm^3 (411 cm^3 is upper limit normal)  Portal Vein Occlusion/Thrombus: None seen  Splenic Vein Occlusion/Thrombus: None seen  Ascites: Small volume  Varices: None identified  IMPRESSION: 1. Patent portal and hepatic veins. 2. Cirrhosis with ascites and splenomegaly.  CT ANGIOGRAPHY ABDOMEN  TECHNIQUE: Multidetector CT imaging of the abdomen was performed using the standard protocol before and during bolus administration of intravenous contrast. Multiplanar reconstructed images and MIPs were obtained and reviewed to evaluate the vascular anatomy.  CONTRAST: 169m OMNIPAQUE IOHEXOL 300 MG/ML SOLN  COMPARISON: 04/24/2012  FINDINGS: VASCULAR  Aorta: Moderate calcified atheromatous plaque particularly in the infrarenal segment. No aneurysm, dissection, or stenosis.  Celiac: Calcified ostial plaque without stenosis. Distal branching unremarkable.  SMA: Patent. Replaced right hepatic arterial supply, an  anatomic variant.  Renals: Single left, mildly atheromatous without high-grade stenosis. Single right, patent.  IMA: Patent without evidence of aneurysm, dissection, vasculitis or significant stenosis.  Inflow: Visualized proximal common iliac arteries mildly atheromatous, patent without aneurysm.  Veins: Patent hepatic veins, portal vein, SMV, IMV, splenic vein. Enlarged coronary vein supplies partially thrombosed esophageal varices which extend to the GE junction. No significant gastric fundal varices. No significant splenorenal shunt.  Review of the MIP images confirms the above findings.  NON-VASCULAR  Lower chest: No pleural or pericardial effusion.  Hepatobiliary: Nodule hepatic contour without focal lesion or biliary ductal dilatation. Previous cholecystectomy. Ectatic CBD measured up to 1.1 cm diameter, seen down to the ampulla.  Pancreas: Mild diffuse atrophy without mass or ductal dilatation.  Spleen: Splenomegaly measured up to 13.4 cm. No focal lesion.  Adrenals/Urinary Tract: Mild bilateral adrenal hyperplasia. Bilateral renal cysts. No hydronephrosis or urolithiasis.  Stomach/Bowel: Stomach is partially distended, with hyperdense intraluminal material in the fundus consistent with thrombus. Visualized small bowel and colon are nondilated.  Lymphatic: Stable prominent subcentimeter aortocaval and periportal lymph nodes. No mesenteric adenopathy.  Other: Small volume perihepatic and perisplenic ascites. No free air.  Musculoskeletal: Small paraumbilical hernia containing only mesenteric fat. No fracture or worrisome bone lesion.  IMPRESSION: 1. Cirrhosis with splenomegaly and small volume ascites. 2. Partially thrombosed esophageal varices. No significant gastric fundal varices or splenorenal shunt.  Aortic Atherosclerosis (ICD10-I70.0).    Labs:  CBC: Recent Labs    10/23/19 0209 10/27/19 1109 07/10/2020 1929 06/30/2020 2256 06/24/2020 0129    WBC 7.3 7.8 11.2* 14.2*  --   HGB 13.3 15.5 11.4* 9.7* 12.6  HCT 40.0 46.6 35.7* 30.0* 37.0  PLT 66* 69* 100* 156  --  COAGS: Recent Labs    10/15/19 0154 06/22/2020 1929  INR 1.2 1.2  APTT  --  34    BMP: Recent Labs    10/21/19 0910 10/21/19 0910 10/22/19 0158 10/22/19 0158 10/23/19 0209 10/23/19 0209 10/27/19 1108 07/02/2020 1929 06/30/2020 2256 07/10/2020 0129  NA 133*   < > 134*   < > 135  --  136 130* 132* 131*  K 4.2   < > 4.8   < > 4.6   < > 4.1 3.8 4.6 5.0  CL 95*   < > 95*   < > 97*  --  96 98 100  --   CO2 26   < > 27   < > 27  --  23 19* 19*  --   GLUCOSE 109*   < > 266*   < > 147*  --  235* 183* 329*  --   BUN 24*   < > 24*   < > 19  --  15 28* 31*  --   CALCIUM 9.2   < > 9.0   < > 9.3  --  8.9 8.4* 8.5*  --   CREATININE 1.01*   < > 1.15*   < > 1.02*  --  1.05* 0.93 1.32*  --   GFRNONAA 51*   < > 44*   < > 51*  --  49* >60 40*  --   GFRAA 60*  --  51*  --  59*  --  57*  --   --   --    < > = values in this interval not displayed.    LIVER FUNCTION TESTS: Recent Labs    10/14/19 1940 10/14/19 1940 10/15/19 0520 10/15/19 0520 10/22/19 0158 10/23/19 0209 07/03/2020 1929 07/03/2020 2256  BILITOT 1.1  --  1.4*  --   --   --  1.0 1.4*  AST 33  --  29  --   --   --  26 23  ALT 19  --  20  --   --   --  19 18  ALKPHOS 159*  --  154*  --   --   --  129* 113  PROT 6.2*  --  6.6  --   --   --  5.5* 5.0*  ALBUMIN 2.7*   < > 3.2*   < > 3.0* 3.2* 2.7* 2.4*   < > = values in this interval not displayed.    TUMOR MARKERS: No results for input(s): AFPTM, CEA, CA199, CHROMGRNA in the last 8760 hours.  MELD score 18 Assessment and Plan: Cirrhosis with esophageal varices, currently the bleeding seems to have tapered off post endo intervention. H/H remain good although patient still on pressors with mild hypotension.  She is a borderline candidate for TIPS due to her elevated MELD score which would predict an elevated 90-day mortality risk. However, she is  anatomically approachable for TIPS creation should bleeding persist uncontrolled, which does not currently appear to be the case. WIll discuss with family, but currently on hold for intervention. IR remains available should situation deteriorate with  recurrent bleeding and family wish to proceed.  Thank you for this interesting consult.  I greatly enjoyed meeting [employer] and look forward to participating in their care.  A copy of this report was sent to the requesting provider on this date.  Electronically Signed: Rickard Rhymes, MD 07/15/2020, 2:14 AM   I spent a total of 40 Minutes  in face to face  clinical consultation, greater than 50% of which was counseling/coordinating care for bleeding esophageal varices.

## 2020-07-14 NOTE — Sedation Documentation (Signed)
POST TIPS Pressures  RA Pressure 26/19 (22) PV Pressure 29 mean

## 2020-07-14 NOTE — Progress Notes (Signed)
[facility] Gastroenterology Progress Note  [name] 86 y.o. 1934/06/07  CC: Active variceal bleeding   Subjective: Patient seen and examined at bedside.  Remains in ICU.  Discussed with ICU staff.  No melena but had another episode of hematemesis followed by hypotension.  She remains tachycardic.  ROS : Not able to obtain.   Objective: Vital signs in last 24 hours: Vitals:   06/28/2020 0734 07/19/2020 0800  BP:  (!) 89/41  Pulse:  (!) 124  Resp:  (!) 28  Temp: 97.7 F (36.5 C)   SpO2:  99%    Physical Exam:  General:   Intubated.  Sedated.  Head:  Normocephalic, without obvious abnormality, atraumatic     Lungs:    Coarse breath sounds.  Heart:  Regular rate and rhythm, S1, S2 normal  Abdomen:   Soft, nondistended, nontender, bowel sounds present.  No peritoneal signs  Extremities: Extremities normal, atraumatic, no  edema  Pulses: 2+ and symmetric    Lab Results: Recent Labs    06/29/2020 2256 07/18/2020 2256 06/24/2020 0129 06/30/2020 0341  NA 132*   < > 131* 131*  K 4.6   < > 5.0 4.8  CL 100  --   --  99  CO2 19*  --   --  16*  GLUCOSE 329*  --   --  393*  BUN 31*  --   --  33*  CREATININE 1.32*  --   --  1.17*  CALCIUM 8.5*  --   --  8.4*  MG  --   --   --  1.7  PHOS  --   --   --  5.0*   < > = values in this interval not displayed.   Recent Labs    07/05/2020 1929 06/22/2020 2256  AST 26 23  ALT 19 18  ALKPHOS 129* 113  BILITOT 1.0 1.4*  PROT 5.5* 5.0*  ALBUMIN 2.7* 2.4*   Recent Labs    07/09/2020 1929 07/10/2020 1929 07/19/2020 2256 07/17/2020 2256 07/12/2020 0129 07/16/2020 0341  WBC 11.2*   < > 14.2*  --   --  25.5*  NEUTROABS 8.8*  --   --   --   --   --   HGB 11.4*   < > 9.7*   < > 12.6 13.1  HCT 35.7*   < > 30.0*   < > 37.0 39.8  MCV 92.5   < > 92.6  --   --  90.7  PLT 100*   < > 156  --   --  156   < > = values in this interval not displayed.   Recent Labs    07/12/2020 1929  LABPROT 14.6  INR 1.2      Assessment/Plan: -Active variceal  bleeding.  EGD last night showed active bleeding at GE junction.  5 bands were placed.  Large amount of blood in the fundus and gastric variceal bleeding cannot be ruled out. -Acute blood loss anemia -Nonalcoholic cirrhosis  Recommendations ------------------------ -Continue octreotide, Protonix and antibiotics -May need TIPS or BRTO because of ongoing bleeding -Repeat CBC.  Current CBC appears to be hemoconcentrated -Patient remains critically ill.  GI will follow   [name] MD, FACP 07/03/2020, 9:24 AM  Contact #  [phone]

## 2020-07-14 NOTE — Progress Notes (Signed)
Interventional Radiology Brief Note:  Called and spoke with daughter, [name].  [name] reports that she has had several prior discussions with [name] who would want medical teams to attempt to save her life.  She states her mother would want a TIPS procedure if indicated.   Risks and benefits of TIPS, BRTO and/or additional variceal embolization were discussed with the patient and/or the patient's family including, but not limited to, infection, bleeding, damage to adjacent structures, worsening hepatic and/or cardiac function, worsening and/or the development of altered mental status/encephalopathy, non-target embolization and death.   We specifically discussed the risk of hepatic encephalopathy post procedure and the fact that her degree of cirrhosis and systemic dysfunction (represented by her MELD score of 18) puts her at greater risk.    This interventional procedure involves the use of X-rays and because of the nature of the planned procedure, it is possible that we will have prolonged use of X-ray fluoroscopy.  Potential radiation risks to you include (but are not limited to) the following: - A slightly elevated risk for cancer  several years later in life. This risk is typically less than 0.5% percent. This risk is low in comparison to the normal incidence of human cancer, which is 33% for women and 50% for men according to the [employer]. - Radiation induced injury can include skin redness, resembling a rash, tissue breakdown / ulcers and hair loss (which can be temporary or permanent).   The likelihood of either of these occurring depends on the difficulty of the procedure and whether you are sensitive to radiation due to previous procedures, disease, or genetic conditions.   IF your procedure requires a prolonged use of radiation, you will be notified and given written instructions for further action.  It is your responsibility to monitor the irradiated area for the 2 weeks  following the procedure and to notify your physician if you are concerned that you have suffered a radiation induced injury.    All of the patient's questions were answered, patient is agreeable to proceed.  Consent signed and in chart.  Madison Greathouse, MS RD PA-C

## 2020-07-14 NOTE — Brief Op Note (Addendum)
07/02/2020 - 06/27/2020  1:02 AM  PATIENT:  [name]  86 y.o. female  PRE-OPERATIVE DIAGNOSIS:  GI bleed  POST-OPERATIVE DIAGNOSIS:  EGD: Variceal bleeding, 5 bands placed  PROCEDURE:  Procedure(s): ESOPHAGOGASTRODUODENOSCOPY (EGD) WITH PROPOFOL (N/A)  SURGEON:  Surgeon(s) and Role:    * Chara Marquard, MD - Primary  Findings ----------- -Large esophageal varices with possible active bleeding at GE junction.  Large amount of blood in the fundus.  Cannot rule out active bleeding from gastric varices.  5 bands placed on esophageal varices.  Incomplete eradication  Recommendations ------------------------ -Recommend intervention radiology consult for TIPS for  ongoing bleeding -Case discussed with Dr. Vernard Gambles from interventional radiology. - Pre- TIPS ultrasound liver Doppler ordered -Continue octreotide drip.  Continue supportive care. -She will be transferred to ICU for close monitoring -Discussed with patient's son at bedside -Prognosis guarded - D/W Dr. Halford Chessman.   [name] MD, [place] 07/04/2020, 1:04 AM  Contact #  [phone]

## 2020-07-14 NOTE — Anesthesia Postprocedure Evaluation (Signed)
Anesthesia Post Note  Patient: [name]  Procedure(s) Performed: IR WITH ANESTHESIA- TIPPS (N/A )     Patient location during evaluation: SICU Anesthesia Type: General Level of consciousness: sedated Pain management: pain level controlled Vital Signs Assessment: post-procedure vital signs reviewed and stable Respiratory status: patient remains intubated per anesthesia plan Cardiovascular status: stable Postop Assessment: no apparent nausea or vomiting Anesthetic complications: no Comments: Remains vent dependent on pressors post procedure.    No complications documented.  Last Vitals:  Vitals:   07/07/2020 1155 07/06/2020 1425  BP:  (!) 117/49  Pulse:  (!) 105  Resp:  (!) 26  Temp: 36.6 C   SpO2:  100%    Last Pain:  Vitals:   07/08/2020 1155  TempSrc: Axillary                 [name]

## 2020-07-14 NOTE — Progress Notes (Signed)
RT assisted with vent patient transport from IR to 8E03 without complications.

## 2020-07-14 NOTE — Procedures (Signed)
Arterial Catheter Insertion Procedure Note  [name]  [id number]  Oct 19, 1933  Date:07/19/2020  Time:10:52 AM    Provider Performing: Candee Furbish    Procedure: Insertion of Arterial Line [phone]) with US guidance ([id number])   Indication(s) Blood pressure monitoring and/or need for frequent ABGs  Consent Risks of the procedure as well as the alternatives and risks of each were explained to the patient and/or caregiver.  Consent for the procedure was obtained and is signed in the bedside chart  Anesthesia None   Time Out Verified patient identification, verified procedure, site/side was marked, verified correct patient position, special equipment/implants available, medications/allergies/relevant history reviewed, required imaging and test results available.   Sterile Technique Maximal sterile technique including full sterile barrier drape, hand hygiene, sterile gown, sterile gloves, mask, hair covering, sterile ultrasound probe cover (if used).   Procedure Description Area of catheter insertion was cleaned with chlorhexidine and draped in sterile fashion. With real-time ultrasound guidance an arterial catheter was placed into the right femoral artery.  Appropriate arterial tracings confirmed on monitor.     Complications/Tolerance None; patient tolerated the procedure well.   EBL Minimal   Specimen(s) None

## 2020-07-14 NOTE — Progress Notes (Signed)
Seen and examined, trend H/H, if stable SAT/SBT later today.  Erskine Emery MD PCCM

## 2020-07-15 ENCOUNTER — Encounter (HOSPITAL_COMMUNITY): Payer: Self-pay | Admitting: Radiology

## 2020-07-15 ENCOUNTER — Inpatient Hospital Stay (HOSPITAL_COMMUNITY): Payer: HMO

## 2020-07-15 DIAGNOSIS — K921 Melena: Secondary | ICD-10-CM | POA: Diagnosis not present

## 2020-07-15 DIAGNOSIS — R4182 Altered mental status, unspecified: Secondary | ICD-10-CM

## 2020-07-15 LAB — HEPATIC FUNCTION PANEL
ALT: 49 U/L — ABNORMAL HIGH (ref 0–44)
AST: 80 U/L — ABNORMAL HIGH (ref 15–41)
Albumin: 2.6 g/dL — ABNORMAL LOW (ref 3.5–5.0)
Alkaline Phosphatase: 88 U/L (ref 38–126)
Bilirubin, Direct: 0.5 mg/dL — ABNORMAL HIGH (ref 0.0–0.2)
Indirect Bilirubin: 0.9 mg/dL (ref 0.3–0.9)
Total Bilirubin: 1.4 mg/dL — ABNORMAL HIGH (ref 0.3–1.2)
Total Protein: 4.8 g/dL — ABNORMAL LOW (ref 6.5–8.1)

## 2020-07-15 LAB — BASIC METABOLIC PANEL
Anion gap: 14 (ref 5–15)
Anion gap: 17 — ABNORMAL HIGH (ref 5–15)
BUN: 45 mg/dL — ABNORMAL HIGH (ref 8–23)
BUN: 45 mg/dL — ABNORMAL HIGH (ref 8–23)
CO2: 20 mmol/L — ABNORMAL LOW (ref 22–32)
CO2: 18 mmol/L — ABNORMAL LOW (ref 22–32)
Calcium: 8.3 mg/dL — ABNORMAL LOW (ref 8.9–10.3)
Calcium: 8.6 mg/dL — ABNORMAL LOW (ref 8.9–10.3)
Chloride: 104 mmol/L (ref 98–111)
Chloride: 107 mmol/L (ref 98–111)
Creatinine, Ser: 1.6 mg/dL — ABNORMAL HIGH (ref 0.44–1.00)
Creatinine, Ser: 1.56 mg/dL — ABNORMAL HIGH (ref 0.44–1.00)
GFR, Estimated: 32 mL/min — ABNORMAL LOW (ref >60)
GFR, Estimated: 33 mL/min — ABNORMAL LOW (ref >60)
Glucose, Bld: 253 mg/dL — ABNORMAL HIGH (ref 70–99)
Glucose, Bld: 305 mg/dL — ABNORMAL HIGH (ref 70–99)
Potassium: 4.3 mmol/L (ref 3.5–5.1)
Potassium: 4.5 mmol/L (ref 3.5–5.1)
Sodium: 138 mmol/L (ref 135–145)
Sodium: 142 mmol/L (ref 135–145)

## 2020-07-15 LAB — CBC
HCT: 33.2 % — ABNORMAL LOW (ref 36.0–46.0)
Hemoglobin: 11.5 g/dL — ABNORMAL LOW (ref 12.0–15.0)
MCH: 31.1 pg (ref 26.0–34.0)
MCHC: 34.6 g/dL (ref 30.0–36.0)
MCV: 89.7 fL (ref 80.0–100.0)
Platelets: UNDETERMINED 10*3/uL (ref 150–400)
RBC: 3.7 MIL/uL — ABNORMAL LOW (ref 3.87–5.11)
RDW: 15.6 % — ABNORMAL HIGH (ref 11.5–15.5)
WBC: 26.3 10*3/uL — ABNORMAL HIGH (ref 4.0–10.5)
nRBC: 0.3 % — ABNORMAL HIGH (ref 0.0–0.2)

## 2020-07-15 LAB — TRIGLYCERIDES: Triglycerides: 367 mg/dL — ABNORMAL HIGH (ref <150)

## 2020-07-15 LAB — GLUCOSE, CAPILLARY
Glucose-Capillary: 220 mg/dL — ABNORMAL HIGH (ref 70–99)
Glucose-Capillary: 219 mg/dL — ABNORMAL HIGH (ref 70–99)
Glucose-Capillary: 227 mg/dL — ABNORMAL HIGH (ref 70–99)
Glucose-Capillary: 233 mg/dL — ABNORMAL HIGH (ref 70–99)
Glucose-Capillary: 309 mg/dL — ABNORMAL HIGH (ref 70–99)
Glucose-Capillary: 323 mg/dL — ABNORMAL HIGH (ref 70–99)

## 2020-07-15 LAB — AMMONIA: Ammonia: 382 umol/L — ABNORMAL HIGH (ref 9–35)

## 2020-07-15 MED ORDER — LEVOTHYROXINE SODIUM 100 MCG PO TABS
100.0000 ug | ORAL_TABLET | Freq: Every day | ORAL | Status: DC
  Administered 2020-07-15 – 2020-07-20 (×7): 100 ug
  Filled 2020-07-15 (×6): qty 1

## 2020-07-15 MED ORDER — MONTELUKAST SODIUM 10 MG PO TABS
10.0000 mg | ORAL_TABLET | Freq: Every day | ORAL | Status: DC
  Administered 2020-07-15 – 2020-07-19 (×5): 10 mg
  Filled 2020-07-15 (×7): qty 1

## 2020-07-15 MED ORDER — ALBUMIN HUMAN 25 % IV SOLN
25.0000 g | Freq: Four times a day (QID) | INTRAVENOUS | Status: AC
  Administered 2020-07-15 – 2020-07-16 (×4): 25 g via INTRAVENOUS
  Filled 2020-07-15 (×4): qty 100

## 2020-07-15 MED ORDER — ADULT MULTIVITAMIN W/MINERALS CH
1.0000 | ORAL_TABLET | Freq: Every day | ORAL | Status: DC
  Administered 2020-07-15 – 2020-07-19 (×5): 1
  Filled 2020-07-15 (×5): qty 1

## 2020-07-15 MED ORDER — PROSOURCE TF PO LIQD
45.0000 mL | Freq: Four times a day (QID) | ORAL | Status: DC
  Administered 2020-07-15 – 2020-07-19 (×19): 45 mL
  Filled 2020-07-15 (×17): qty 45

## 2020-07-15 MED ORDER — RIFAXIMIN 550 MG PO TABS
550.0000 mg | ORAL_TABLET | Freq: Two times a day (BID) | ORAL | Status: DC
  Administered 2020-07-15 – 2020-07-19 (×9): 550 mg
  Filled 2020-07-15 (×9): qty 1

## 2020-07-15 MED ORDER — LACTULOSE 10 GM/15ML PO SOLN
30.0000 g | Freq: Three times a day (TID) | ORAL | Status: DC
  Administered 2020-07-15: 30 g via ORAL
  Filled 2020-07-15: qty 45

## 2020-07-15 MED ORDER — ONDANSETRON HCL 4 MG PO TABS: 4.0000 mg | ORAL_TABLET | Freq: Four times a day (QID) | ORAL | Status: DC | PRN

## 2020-07-15 MED ORDER — DOCUSATE SODIUM 50 MG/5ML PO LIQD: 100.0000 mg | Freq: Two times a day (BID) | ORAL | Status: DC | PRN

## 2020-07-15 MED ORDER — LORATADINE 10 MG PO TABS
10.0000 mg | ORAL_TABLET | Freq: Every day | ORAL | Status: DC
  Administered 2020-07-15 – 2020-07-19 (×5): 10 mg
  Filled 2020-07-15 (×5): qty 1

## 2020-07-15 MED ORDER — INSULIN GLARGINE 100 UNIT/ML ~~LOC~~ SOLN
35.0000 [IU] | Freq: Every day | SUBCUTANEOUS | Status: DC
  Administered 2020-07-15: 35 [IU] via SUBCUTANEOUS
  Filled 2020-07-15 (×3): qty 0.35

## 2020-07-15 MED ORDER — LACTULOSE 10 GM/15ML PO SOLN
30.0000 g | Freq: Three times a day (TID) | ORAL | Status: DC
  Administered 2020-07-15 – 2020-07-19 (×14): 30 g
  Filled 2020-07-15 (×15): qty 45

## 2020-07-15 MED ORDER — ESCITALOPRAM OXALATE 10 MG PO TABS
20.0000 mg | ORAL_TABLET | Freq: Every day | ORAL | Status: DC
  Administered 2020-07-15 – 2020-07-19 (×5): 20 mg
  Filled 2020-07-15 (×5): qty 2

## 2020-07-15 MED ORDER — ONDANSETRON HCL 4 MG/2ML IJ SOLN: 4.0000 mg | Freq: Four times a day (QID) | INTRAMUSCULAR | Status: DC | PRN

## 2020-07-15 MED ORDER — RIFAXIMIN 550 MG PO TABS
550.0000 mg | ORAL_TABLET | Freq: Two times a day (BID) | ORAL | Status: DC
  Administered 2020-07-15: 550 mg via ORAL
  Filled 2020-07-15: qty 1

## 2020-07-15 MED ORDER — VITAL 1.5 CAL PO LIQD
1000.0000 mL | ORAL | Status: DC
  Administered 2020-07-15 – 2020-07-19 (×5): 1000 mL
  Filled 2020-07-15 (×4): qty 1000

## 2020-07-15 MED ORDER — VITAL HIGH PROTEIN PO LIQD
1000.0000 mL | ORAL | Status: DC
  Administered 2020-07-15: 1000 mL

## 2020-07-15 MED ORDER — URSODIOL 60 MG/ML SUSP
250.0000 mg | Freq: Three times a day (TID) | ORAL | Status: DC
  Filled 2020-07-15: qty 8.3

## 2020-07-15 MED ORDER — POLYETHYLENE GLYCOL 3350 17 G PO PACK: 17.0000 g | PACK | Freq: Every day | ORAL | Status: DC | PRN

## 2020-07-15 NOTE — Progress Notes (Signed)
NAME:  [name], MRN:  [id number], DOB:  08/24/1933, LOS: 2 ADMISSION DATE:  07/06/2020, CONSULTATION DATE:  07/15/20 REFERRING MD: EDP  , CHIEF COMPLAINT:  GIB   Brief History   86 year old female with a past medical history of [name] cirrhosis and esophageal varices COPD, CAD diabetes hyperlipidemia presented with syncope and hematemesis.  She became increasingly hypotensive with repeated hematemesis, so was intubated and plan for emergent EGD and transfusion.  History of present illness   [name] is an 86 year old female with a past medical history of [name] cirrhosis and esophageal varices COPD, CAD,  Diabetes,  hyperlipidemia who was in her usual state of health until she went to the bathroom and became lightheaded and syncopized.  When she woke up she began vomiting bright red blood.  EMS found patient hypotensive and weak.  She states she has recently had dark stools, but takes iron so this is normal for her.  Patient's last EGD was in 2016 and was diagnosed with grade 2 esophageal varices and portal hypertensive gastropathy.  She is not on aspirin, blood thinners or PPIs.  He is not sure if she is taking any OTC NSAIDs.   In the ED, patient's blood pressure initially improved with 500 cc bolus.  Hemoglobin was 11.4, this down trended to 9.7.   She also had several episodes of large-volume hematemesis and worsening hypotension so was intubated and started on Levophed.  PCCM consulted for admission and gastroenterology present to perform emergent EGD.   Past Medical History   has a past medical history of Cirrhosis, non-alcoholic ([place]), COPD (chronic obstructive pulmonary disease) ([place]), Coronary artery disease, Diabetes mellitus, Dyslipidemia, Fall, Hypotension (08/02/2010), Hypothyroidism, Leg edema, Legally blind, Myocardial infarct, old, Neuropathy of hand, Obesity, and Sleep apnea.   Significant Hospital Events   11/25 Admit to PCCM  Consults:  Gastroenterology  Procedures:    11/25 CVC by EDP 11/25 ETT, EGD/banding 11/25 [place] (since removed), emergent TIPS  Significant Diagnostic Tests:  11/24 CXR>> lungs clear and ETT in good position  Micro Data:  11/24 Covid-19 and flu>> negative  Antimicrobials:  Ceftriaxone 11/25-   Interim history/subjective:  No events. Still on pressors but also fair bit of sedation. Ammonia > 300  Objective   Blood pressure (!) 117/59, pulse 95, temperature 99.7 F (37.6 C), temperature source Oral, resp. rate (!) 26, height 5' (1.524 m), weight 72.3 kg, SpO2 97 %.    Vent Mode: PRVC FiO2 (%):  [40 %-100 %] 40 % Set Rate:  [26 bmp] 26 bmp Vt Set:  [360 mL] 360 mL PEEP:  [5 cmH20] 5 cmH20 Plateau Pressure:  [12 cmH20-15 cmH20] 12 cmH20   Intake/Output Summary (Last 24 hours) at 07/15/2020 0734 Last data filed at 07/15/2020 0600 Gross per 24 hour  Intake 4490.19 ml  Output 1350 ml  Net 3140.19 ml   Filed Weights   07/07/2020 2300 07/18/2020 0200 07/15/20 0500  Weight: 71.2 kg 72.5 kg 72.3 kg   Constitutional: elderly woman in NAD  Eyes: pupils pinpoint, equal, reactive to light Ears, nose, mouth, and throat: tongue dry, dried blood around mouth and nares, ETT with small thick secretions Cardiovascular: RRR, ext warm Respiratory: scattered rhonci, triggering vent Gastrointestinal: soft, hypoactive BS Skin: No rashes, normal turgor Neurologic: extremely sedated, will have to see what she does with SAT Psychiatric: as above  Ammonia 380 Cr worse H/H stable TG up   Resolved Hospital Problem list     Assessment & Plan:  Hemorrhagic  Shock secondary to variceal bleed, NASH cirrhosis- s/p emergent EGD and TIPS.  H/H stable, no signs of ongoing bleeding.  Lingering shock likely from sedation, will lighten this. - Trend H/H, INR, plts, usual transfusion thresholds - Will discuss d/c of octreotide with GI - Continue PPI - Start TF  Sedation-associated and hepatic encephalopathy- will lighten sedation and see  where we stand - Start lactulose, rifaxamin  Acute hypoxemic respiratory failure- on vent - CXR PRN - VAP prevention bundle - Extubation will depend on mental status  Acute kidney injury- albumin 100g 25% over 24h, avoid nephrotoxins  DM2 with hyperglycemia- SSI, lantus, goal 140-180  Best practice (evaluated daily)   Diet: start trickles Pain/Anxiety/Delirium protocol (if indicated): wean VAP protocol (if indicated): in place DVT prophylaxis: SCD's GI prophylaxis: protonix Glucose control: SSI Mobility: bed rest last date of multidisciplinary goals of care discussion: Son updated at the bedside 11/25, wants full code and aggressive care Family and staff present: RN Summary of discussion  Follow up goals of care discussion due 12/2 Code Status: Full code Disposition: ICU   Patient critically ill due to respiratory failure, encephalopathy Interventions to address this today lactulose, rifaxamin, sedation weaning, vent weaning Risk of deterioration without these interventions is high  I personally spent 35 minutes providing critical care not including any separately billable procedures  Erskine Emery MD [facility] 07/15/2020 8:07 AM Personal pager: [phone] If unanswered, please page CCM On-call: [phone]

## 2020-07-15 NOTE — Progress Notes (Signed)
Referring Physician(s): Dr. Serafina Royals  Supervising Physician: Dr. Ruthann Cancer  Patient Status:  [facility] - In-pt  Chief Complaint: GI bleed Esophageal varices NASH cirrhosis  Subjective: Patient intubated.  Sedation currently turned off for EEG. Patient does not arouse On pressors.  Ammonia elevated this AM, hepatic function panel added  Allergies: Ace inhibitors, Benadryl [diphenhydramine hcl (sleep)], Fexofenadine hcl, Penicillins, Sulfonamide derivatives, and Sulfa antibiotics  Medications: Prior to Admission medications   Medication Sig Start Date End Date Taking? Authorizing Provider  albuterol (VENTOLIN HFA) 108 (90 Base) MCG/ACT inhaler INHALE 2 PUFFS BY MOUTH EVERY 6 HOURS AS NEEDED FOR WHEEZING OR SHORTNESS OF BREATH Patient taking differently: Inhale 2 puffs into the lungs every 6 (six) hours as needed for wheezing or shortness of breath.  08/26/19  Yes Opalski, Deborah, DO  brimonidine (ALPHAGAN) 0.2 % ophthalmic solution Place 1 drop into both eyes daily. 03/04/19  Yes [provider]  Cholecalciferol (VITAMIN D3) 50 MCG (2000 UT) TABS Take 2,000 Units by mouth daily.    Yes [provider]  diltiazem (CARDIZEM CD) 120 MG 24 hr capsule Take 1 capsule (120 mg total) by mouth daily. 05/19/20  Yes Nahser, Wonda Cheng, MD  escitalopram (LEXAPRO) 20 MG tablet Take 1 tablet (20 mg total) by mouth daily. 05/19/20  Yes Abonza, Maritza, PA-C  furosemide (LASIX) 40 MG tablet Take 1 tablet by mouth in the morning daily.  You may take additional dose in the afternoon for edema/swelling 11/12/19  Yes Nahser, Wonda Cheng, MD  Insulin Aspart FlexPen 100 UNIT/ML SOPN INJECT 20 UNITS SUBCUTANEOUSLY THREE TIMES A DAY BEFORE MEALS; ADJUST AND INCREASE ACCORDINGLY. Patient taking differently: Inject 15 Units into the skin in the morning and at bedtime.  11/19/19  Yes Opalski, [name], DO  insulin glargine (LANTUS) 100 UNIT/ML injection Inject 0.3 mLs (30 Units total) into the skin at  bedtime. 05/19/20  Yes Abonza, Maritza, PA-C  levocetirizine (XYZAL) 5 MG tablet Take 1 tablet (5 mg total) by mouth every evening. During allergy season only 06/03/19  Yes Opalski, [name], DO  levothyroxine (SYNTHROID) 100 MCG tablet Take 1 tablet (100 mcg total) by mouth daily. 05/19/20  Yes Abonza, Maritza, PA-C  montelukast (SINGULAIR) 10 MG tablet Take 1 tablet (10 mg total) by mouth every evening. 05/19/20  Yes Abonza, Maritza, PA-C  Multiple Vitamins-Minerals (VISION FORMULA/LUTEIN) TABS Take 1 tablet by mouth daily.   Yes [provider]  nitroGLYCERIN (NITROSTAT) 0.4 MG SL tablet PLACE 1 TABLET UNDER THE TONGUE EVERY 5 MINUTES AS NEEDED FOR CHEST PAIN Patient taking differently: Place 0.4 mg under the tongue every 5 (five) minutes as needed for chest pain.  11/12/19  Yes Nahser, Wonda Cheng, MD  pantoprazole (PROTONIX) 20 MG tablet Take 1 tablet (20 mg total) by mouth 2 (two) times daily. I po qam can add I po qpm prn 05/19/20  Yes Abonza, Maritza, PA-C  RESTASIS 0.05 % ophthalmic emulsion Place 1 drop into both eyes 2 (two) times daily.  12/31/18  Yes [provider]  rifaximin (XIFAXAN) 550 MG TABS tablet Take 550 mg by mouth 2 (two) times daily.   Yes [provider]  spironolactone (ALDACTONE) 100 MG tablet Take 0.5 tablets (50 mg total) by mouth in the morning. Patient taking differently: Take 50 mg by mouth in the morning. 1/2 tab on m, w, f 04/05/20  Yes Abonza, Maritza, PA-C  ursodiol (ACTIGALL) 500 MG tablet Take 500 mg by mouth in the morning and at bedtime.  Yes [provider]  Blood Glucose Monitoring Suppl (FREESTYLE LITE) DEVI Use to check blood sugars 2-3 times daily 01/07/17   Mellody Dance, DO  Continuous Blood Gluc Receiver (FREESTYLE LIBRE 14 DAY READER) DEVI USE TO MONITOR BLOOD SUGAR 3 TIMES DAILY 10/13/19   Opalski, [name], DO  Continuous Blood Gluc Sensor (FREESTYLE LIBRE SENSOR SYSTEM) MISC Use to monitor blood sugars TID 08/17/19    Opalski, [name], DO  Lancets (ONETOUCH DELICA PLUS [id number]) MISC USE TO CHECK BLOOD SUGAR THREE TIMES A DAY 11/12/19   Opalski, [name], DO  UNIFINE PENTIPS 31G X 5 MM MISC USE TO INJECT INSULIN 5 TIMES A DAY 07/01/19   Mellody Dance, DO     Vital Signs: BP 126/88   Pulse 96   Temp 98.8 F (37.1 C)   Resp (!) 23   Ht 5' (1.524 m)   Wt 159 lb 6.3 oz (72.3 kg)   SpO2 98%   BMI 31.13 kg/m   Physical Exam  Intubated, not alert, not interactive, does not arouse with stimulation Neck: procedure site intact, no bleeding Mouth: old blood present, no indication of current bleeding Abdomen: soft, no distention.  Procedure site intact, clean, and dry.   Imaging: EEG  Result Date: 07/15/2020 Lora Havens, MD     07/15/2020  9:41 AM Patient Name: [name] MRN: [id number] Epilepsy Attending: Lora Havens Referring Physician/Provider: Dr. Ina Homes Date: 07/15/2020 Duration: 24.37 minutes Patient history: 86 year old female with altered mental status.  EEG to evaluate for seizures. Level of alertness: Comatose AEDs during EEG study: Propofol Technical aspects: This EEG study was done with scalp electrodes positioned according to the 10-20 International system of electrode placement. Electrical activity was acquired at a sampling rate of 500Hz  and reviewed with a high frequency filter of 70Hz  and a low frequency filter of 1Hz . EEG data were recorded continuously and digitally stored. Description: EEG showed continuous generalized background attenuation.  EEG was not reactive to tactile stimulation.  Hyperventilation and photic stimulation were not performed.   ABNORMALITY -Background attenuation, generalized IMPRESSION: This study is suggestive of profound diffuse encephalopathy, nonspecific etiology. No seizures or epileptiform discharges were seen throughout the recording. Lora Havens   DG Abd 1 View  Result Date: 07/06/2020 CLINICAL DATA:  NG tube placement EXAM:  ABDOMEN - 1 VIEW COMPARISON:  07/03/2020 FINDINGS: Interval placement of nasogastric tube with tip below the GE junction in the expected location of the body of stomach. No dilated loops of bowel identified. IMPRESSION: Interval placement of nasogastric tube with tip below the GE junction. Electronically Signed   By: Kerby Moors M.D.   On: 07/05/2020 09:55   CT ANGIO ABDOMEN W &/OR WO CONTRAST  Result Date: 06/26/2020 CLINICAL DATA:  Cirrhosis, variceal bleeding, evaluate for gastric varices, preop planning TIPS EXAM: CT ANGIOGRAPHY ABDOMEN TECHNIQUE: Multidetector CT imaging of the abdomen was performed using the standard protocol before and during bolus administration of intravenous contrast. Multiplanar reconstructed images and MIPs were obtained and reviewed to evaluate the vascular anatomy. CONTRAST:  143m OMNIPAQUE IOHEXOL 300 MG/ML  SOLN COMPARISON:  04/24/2012 FINDINGS: VASCULAR Aorta: Moderate calcified atheromatous plaque particularly in the infrarenal segment. No aneurysm, dissection, or stenosis. Celiac: Calcified ostial plaque without stenosis. Distal branching unremarkable. SMA: Patent. Replaced right hepatic arterial supply, an anatomic variant. Renals: Single left, mildly atheromatous without high-grade stenosis. Single right, patent. IMA: Patent without evidence of aneurysm, dissection, vasculitis or significant stenosis. Inflow: Visualized proximal common iliac arteries mildly atheromatous,  patent without aneurysm. Veins: Patent hepatic veins, portal vein, SMV, IMV, splenic vein. Enlarged coronary vein supplies partially thrombosed esophageal varices which extend to the GE junction. No significant gastric fundal varices. No significant splenorenal shunt. Review of the MIP images confirms the above findings. NON-VASCULAR Lower chest: No pleural or pericardial effusion. Hepatobiliary: Nodule hepatic contour without focal lesion or biliary ductal dilatation. Previous cholecystectomy. Ectatic  CBD measured up to 1.1 cm diameter, seen down to the ampulla. Pancreas: Mild diffuse atrophy without mass or ductal dilatation. Spleen: Splenomegaly measured up to 13.4 cm.  No focal lesion. Adrenals/Urinary Tract: Mild bilateral adrenal hyperplasia. Bilateral renal cysts. No hydronephrosis or urolithiasis. Stomach/Bowel: Stomach is partially distended, with hyperdense intraluminal material in the fundus consistent with thrombus. Visualized small bowel and colon are nondilated. Lymphatic: Stable prominent subcentimeter aortocaval and periportal lymph nodes. No mesenteric adenopathy. Other: Small volume perihepatic and perisplenic ascites. No free air. Musculoskeletal: Small paraumbilical hernia containing only mesenteric fat. No fracture or worrisome bone lesion. IMPRESSION: 1. Cirrhosis with splenomegaly and small volume ascites. 2. Partially thrombosed esophageal varices. No significant gastric fundal varices or splenorenal shunt. Aortic Atherosclerosis (ICD10-I70.0). Electronically Signed   By: Lucrezia Europe M.D.   On: 06/22/2020 08:09   IR Tips  Result Date: 07/04/2020 CLINICAL DATA:  Cirrhosis, portal venous hypertension, recurrent variceal bleeding despite endoscopic intervention, requiring transfusion and pressor support due to hypotension. EXAM: 1. TIPS CREATION 6 2. ULTRASOUND-GUIDED VENOUS ACCESS 3. PARACENTESIS WITH ULTRASOUND GUIDANCE ANESTHESIA/SEDATION: General - as administered by the Anesthesia department MEDICATIONS: See anesthesia record FLUOROSCOPY TIME:  25 minutes; 591 mGy COMPLICATIONS: Transient arrhythmia and hypotension during right atrial traversal, responded to interventions. SIR level B: Nominal therapy (including overnight admission for observation), no consequence. PROCEDURE: Informed written consent was obtained from the daughter (as the patient was sedated and intubated) after a thorough discussion of the procedural risks, benefits and alternatives. All questions were addressed. See  previous consultation. Maximal Sterile Barrier Technique was utilized including caps, mask, sterile gowns, sterile gloves, sterile drape, hand hygiene and skin antiseptic. A timeout was performed prior to the initiation of the procedure. The skin overlying the right upper abdominal quadrant as well as the right neck and femoral region were prepped and draped in usual sterile fashion. Initial ultrasound scanning confirms small volume perihepatic ascites. Operators: Shawnie Dapper Under ultrasound guidance, a 5 French 7 cm Yueh sheath needle was advanced into the peritoneal cavity from a right lateral approach for paracentesis removing yellow ascites. Next, the right internal jugular vein was accessed under direct ultrasound using micropuncture set. Ultrasound image was saved for procedural documentation purposes. This allowed for placement of the 10 French TIPS vascular sheath. Subsequently, the right IJ vein was accessed again under ultrasound guidance with a micropuncture set or inferiorly for placement of an 8 French long vascular sheath through which the ICE catheter was advanced into the IVC for intravascular ultrasound. With the aid of angiographic guidewires, an angled angiographic catheter was utilized to select the right middle vein and a hepatic venogram was performed. Position confirmed with IVUS. Sheath advanced into the right hepatic vein. Under IVUS  guidance, the right portal vein was accessed with a Rsch-Uchida TIPS needle. Ultimately, the right portal vein was accessed centrally at a desirable location allowing advancement of a stiff Glidewire into the main portal vein. A 4 French glide catheter was advanced over the stiff Glidewire and a portal venogram was performed. Pressure measurements were obtained: Right atrium 25/18 (21) mmHg, portal vein 45  mmHg mean. Next,  advancement of the measuring pigtail into the main portal vein. A portal venogram was performed with a measuring Omni Flush catheter.  The parenchymal track was dilated with an 8 mm Mustang Balloon, allowing advancement of the sheath into the main portal vein. Next, a 2 cm (un covered) x 6 cm (covered) x 10 mm diameter GORE VIATORR TIPS Endoprosthesis was advanced through the sheath across the intra hepatic track and Deployed. Follow-up portal venogram demonstrated good stent deployment and flow, no significant residual stenosis. No significant varices or collateral channels were identified. Pressure measurements were obtained: Right atrium 26/19 (22), portal vein 73mHg mean. Discussion with primary service regarding deflation and possible removal of Blakemore tube. During traversal of the right atrium to catheterize the splenic vein via tips, patient developed arrhythmia with hypotension. This responded to intervention. After catheter was advanced into the splenic vein, the gastric catheter was deflated and removed. Follow-up portal venography again showed good stent flow, no significant stenosis, no continued perfusion to the esophageal varices. All wires, catheters and sheaths were removed from the patient. Hemostasis was achieved at the right IJ access sites with manual compression. A dressing was placed. The patient tolerated the procedure well. Patient transferred to the PACU. IMPRESSION: 1. Technically successful TIPS creation with decrease in mean portosystemic pressure from 24 mmHg to 7 mmHg. 2. Technically successful paracentesis under ultrasound guidance. Electronically Signed   By: DLucrezia EuropeM.D.   On: 06/27/2020 15:21   DG Chest Port 1 View  Result Date: 06/23/2020 CLINICAL DATA:  Respiratory failure EXAM: PORTABLE CHEST 1 VIEW COMPARISON:  06/27/2020 FINDINGS: Endotracheal tube tip measures 1.9 cm above the carina. Shallow inspiration. Mild cardiac enlargement. No vascular congestion, edema, or consolidation. No pleural effusions. No pneumothorax. Mediastinal contours appear intact. IMPRESSION: Endotracheal tube tip measures  1.9 cm above the carina. No evidence of active pulmonary disease. Electronically Signed   By: WLucienne CapersM.D.   On: 07/18/2020 06:38   DG Chest Port 1 View  Result Date: 07/10/2020 CLINICAL DATA:  Post intubation EXAM: PORTABLE CHEST 1 VIEW COMPARISON:  10/18/2019 FINDINGS: An endotracheal tube has been placed with tip measuring 2.8 cm above the carina. Shallow inspiration. Heart size and pulmonary vascularity are normal. Lungs are clear. No pleural effusions. No pneumothorax. Mediastinal contours appear intact. Calcification of the aorta. Degenerative changes in the spine and shoulders. IMPRESSION: Endotracheal tube tip measures 2.8 cm above the carina. No evidence of active pulmonary disease. Electronically Signed   By: WLucienne CapersM.D.   On: 06/24/2020 00:10   [place]IVER DOPPLER  Result Date: 07/17/2020 CLINICAL DATA:  Cirrhosis, bleeding esophageal varices, preop planning TIPS EXAM: DUPLEX ULTRASOUND OF LIVER TECHNIQUE: Color and duplex Doppler ultrasound was performed to evaluate the hepatic in-flow and out-flow vessels. COMPARISON:  03/07/2020 and previous FINDINGS: Liver: Nodular contour. No focal lesion or biliary ductal dilatation. Main Portal Vein size: 0.9 cm Portal Vein Velocities Main Prox:  23 cm/sec Main Mid: 35 cm/sec Main Dist:    34 cm/sec Right: 39 cm/sec Left: 25 cm/sec Hepatic Vein Velocities Right:  25 cm/sec Middle:  44 cm/sec Left:  27 cm/sec IVC: Patent, velocity 46 cm/sec Hepatic Artery Velocity:  180 cm/sec Splenic Vein Velocity:  43 cm/sec Spleen: 8.2 cm x 11.4 cm x 11.4 cm with a total volume of 555 cm^3 (411 cm^3 is upper limit normal) Portal Vein Occlusion/Thrombus: None seen Splenic Vein Occlusion/Thrombus: None seen Ascites: Small volume Varices: None identified IMPRESSION: 1. Patent portal  and hepatic veins. 2. Cirrhosis with ascites and splenomegaly. Electronically Signed   By: Lucrezia Europe M.D.   On: 07/16/2020 08:14   IR Paracentesis  Result Date:  06/25/2020 CLINICAL DATA:  Cirrhosis, portal venous hypertension, recurrent variceal bleeding despite endoscopic intervention, requiring transfusion and pressor support due to hypotension. EXAM: 1. TIPS CREATION 6 2. ULTRASOUND-GUIDED VENOUS ACCESS 3. PARACENTESIS WITH ULTRASOUND GUIDANCE ANESTHESIA/SEDATION: General - as administered by the Anesthesia department MEDICATIONS: See anesthesia record FLUOROSCOPY TIME:  25 minutes; 371 mGy COMPLICATIONS: Transient arrhythmia and hypotension during right atrial traversal, responded to interventions. SIR level B: Nominal therapy (including overnight admission for observation), no consequence. PROCEDURE: Informed written consent was obtained from the daughter (as the patient was sedated and intubated) after a thorough discussion of the procedural risks, benefits and alternatives. All questions were addressed. See previous consultation. Maximal Sterile Barrier Technique was utilized including caps, mask, sterile gowns, sterile gloves, sterile drape, hand hygiene and skin antiseptic. A timeout was performed prior to the initiation of the procedure. The skin overlying the right upper abdominal quadrant as well as the right neck and femoral region were prepped and draped in usual sterile fashion. Initial ultrasound scanning confirms small volume perihepatic ascites. Operators: Shawnie Dapper Under ultrasound guidance, a 5 French 7 cm Yueh sheath needle was advanced into the peritoneal cavity from a right lateral approach for paracentesis removing yellow ascites. Next, the right internal jugular vein was accessed under direct ultrasound using micropuncture set. Ultrasound image was saved for procedural documentation purposes. This allowed for placement of the 10 French TIPS vascular sheath. Subsequently, the right IJ vein was accessed again under ultrasound guidance with a micropuncture set or inferiorly for placement of an 8 French long vascular sheath through which the ICE  catheter was advanced into the IVC for intravascular ultrasound. With the aid of angiographic guidewires, an angled angiographic catheter was utilized to select the right middle vein and a hepatic venogram was performed. Position confirmed with IVUS. Sheath advanced into the right hepatic vein. Under IVUS  guidance, the right portal vein was accessed with a Rsch-Uchida TIPS needle. Ultimately, the right portal vein was accessed centrally at a desirable location allowing advancement of a stiff Glidewire into the main portal vein. A 4 French glide catheter was advanced over the stiff Glidewire and a portal venogram was performed. Pressure measurements were obtained: Right atrium 25/18 (21) mmHg, portal vein 45 mmHg mean. Next,  advancement of the measuring pigtail into the main portal vein. A portal venogram was performed with a measuring Omni Flush catheter. The parenchymal track was dilated with an 8 mm Mustang Balloon, allowing advancement of the sheath into the main portal vein. Next, a 2 cm (un covered) x 6 cm (covered) x 10 mm diameter GORE VIATORR TIPS Endoprosthesis was advanced through the sheath across the intra hepatic track and Deployed. Follow-up portal venogram demonstrated good stent deployment and flow, no significant residual stenosis. No significant varices or collateral channels were identified. Pressure measurements were obtained: Right atrium 26/19 (22), portal vein 41mHg mean. Discussion with primary service regarding deflation and possible removal of Blakemore tube. During traversal of the right atrium to catheterize the splenic vein via tips, patient developed arrhythmia with hypotension. This responded to intervention. After catheter was advanced into the splenic vein, the gastric catheter was deflated and removed. Follow-up portal venography again showed good stent flow, no significant stenosis, no continued perfusion to the esophageal varices. All wires, catheters and sheaths were removed  from the  patient. Hemostasis was achieved at the right IJ access sites with manual compression. A dressing was placed. The patient tolerated the procedure well. Patient transferred to the PACU. IMPRESSION: 1. Technically successful TIPS creation with decrease in mean portosystemic pressure from 24 mmHg to 7 mmHg. 2. Technically successful paracentesis under ultrasound guidance. Electronically Signed   By: Lucrezia Europe M.D.   On: 06/27/2020 15:21    Labs:  CBC: Recent Labs    06/30/2020 0341 07/12/2020 0341 07/06/2020 0950 07/02/2020 0950 07/06/2020 1001 06/20/2020 1643 07/15/2020 1654 07/15/20 0224  WBC 25.5*  --  31.9*  --   --  26.3*  --  26.3*  HGB 13.1   < > 12.7   < > 12.6 11.8* 11.6* 11.5*  HCT 39.8   < > 38.7   < > 37.0 35.3* 34.0* 33.2*  PLT 156  --  169  --   --  131*  --  PLATELET CLUMPS NOTED ON SMEAR, UNABLE TO ESTIMATE   < > = values in this interval not displayed.    COAGS: Recent Labs    10/15/19 0154 07/18/2020 1929 07/03/2020 1741  INR 1.2 1.2 1.5*  APTT  --  34  --     BMP: Recent Labs    10/21/19 0910 10/21/19 0910 10/22/19 0158 10/22/19 0158 10/23/19 0209 10/23/19 0209 10/27/19 1108 06/29/2020 1929 07/15/2020 2256 06/28/2020 0129 07/08/2020 0341 06/24/2020 0341 07/07/2020 1001 06/22/2020 1643 07/19/2020 1654 07/15/20 0225  NA 133*   < > 134*   < > 135  --  136   < > 132*   < > 131*   < > 133* 136 137 138  K 4.2   < > 4.8   < > 4.6   < > 4.1   < > 4.6   < > 4.8   < > 4.3 4.4 4.1 4.3  CL 95*   < > 95*   < > 97*   < > 96   < > 100  --  99  --   --  105  --  104  CO2 26   < > 27   < > 27   < > 23   < > 19*  --  16*  --   --  17*  --  20*  GLUCOSE 109*   < > 266*   < > 147*  --  235*   < > 329*  --  393*  --   --  333*  --  253*  BUN 24*   < > 24*   < > 19  --  15   < > 31*  --  33*  --   --  36*  --  45*  CALCIUM 9.2   < > 9.0   < > 9.3   < > 8.9   < > 8.5*  --  8.4*  --   --  8.7*  --  8.3*  CREATININE 1.01*   < > 1.15*   < > 1.02*   < > 1.05*   < > 1.32*  --  1.17*  --   --   1.37*  --  1.60*  GFRNONAA 51*   < > 44*   < > 51*   < > 49*   < > 40*  --  46*  --   --  38*  --  32*  GFRAA 60*  --  51*  --  59*  --  57*  --   --   --   --   --   --   --   --   --    < > = values in this interval not displayed.    LIVER FUNCTION TESTS: Recent Labs    10/14/19 1940 10/14/19 1940 10/15/19 0520 10/15/19 0520 10/22/19 0158 10/23/19 0209 07/07/2020 1929 06/23/2020 2256  BILITOT 1.1  --  1.4*  --   --   --  1.0 1.4*  AST 33  --  29  --   --   --  26 23  ALT 19  --  20  --   --   --  19 18  ALKPHOS 159*  --  154*  --   --   --  129* 113  PROT 6.2*  --  6.6  --   --   --  5.5* 5.0*  ALBUMIN 2.7*   < > 3.2*   < > 3.0* 3.2* 2.7* 2.4*   < > = values in this interval not displayed.    Assessment and Plan: Esophageal varices, hemorrhagic s/p TIPS procedure yesterday with Dr. Serafina Royals and Dr. Vernard Gambles. Patient assessed this AM. Remains intubated. Does not arouse even with no sedation running.  EEG completed.  Hepatic function panel obtained.   Ammonia 382.  Getting scheduled lactulose.  No evidence of rebleeding, however Hgb trending downward.   HgB 13.7  12.6  11.5 Following trends.   Electronically Signed: Docia Barrier, PA 07/15/2020, 10:13 AM   I spent a total of 15 Minutes at the the patient's bedside AND on the patient's hospital floor or unit, greater than 50% of which was counseling/coordinating care for esophageal varices.

## 2020-07-15 NOTE — Progress Notes (Addendum)
Initial Nutrition Assessment  DOCUMENTATION CODES:   Not applicable  INTERVENTION:   Initiate tube feeding via OG tube: Vital 1.5 at 40 ml/h (960 ml per day) Prosource TF 45 ml QID  Provides 1600 kcal, 109 gm protein, 733 ml free water daily  MVI with minerals tablet, crushed, via tube daily  NUTRITION DIAGNOSIS:   Increased nutrient needs related to acute illness, chronic illness (NASH cirrhosis with esophageal varices) as evidenced by estimated needs.  GOAL:   Patient will meet greater than or equal to 90% of their needs  MONITOR:   Vent status, Labs, TF tolerance  REASON FOR ASSESSMENT:   Ventilator, Consult Enteral/tube feeding initiation and management  ASSESSMENT:   86 yo female admitted with syncope, hematemesis. PMH includes NASH cirrhosis, esophageal varices, COPD, CAD, DM, HLD.  11/25: S/P [place] drain placement (since removed), then emergent TIPS on admission. Intubated and had EGD with banding.  Received MD Consult for TF initiation and management. OG tube in place.   Patient is currently intubated on ventilator support MV: 11.5 L/min Temp (24hrs), Avg:99 F (37.2 C), Min:97.9 F (36.6 C), Max:100.6 F (38.1 C)  Propofol: currently off  Labs reviewed.  CBG: 220-219  Medications reviewed and include novolog, lantus, lactulose, levophed, protonix, vasopressin, octreotide.  Recent weights reviewed. No significant weight changes noted.  Spoke with daughter at bedside. Patient usually weighs ~160-175 lbs. Weight fluctuates with fluid status r/t liver disease. Patient usually has a good appetite and eats well, but does have times where she eats poorly.   NUTRITION - FOCUSED PHYSICAL EXAM:    Most Recent Value  Orbital Region No depletion  Upper Arm Region No depletion  Thoracic and Lumbar Region No depletion  Buccal Region No depletion  Temple Region No depletion  Clavicle Bone Region No depletion  Clavicle and Acromion Bone Region No  depletion  Scapular Bone Region Unable to assess  Dorsal Hand No depletion  Patellar Region No depletion  Anterior Thigh Region No depletion  Posterior Calf Region No depletion  Edema (RD Assessment) Mild  Hair Reviewed  Eyes Unable to assess  Mouth Unable to assess  Skin Reviewed  Nails Reviewed       Diet Order:   Diet Order            Diet NPO time specified  Diet effective now                 EDUCATION NEEDS:   Not appropriate for education at this time  Skin:  Skin Assessment: Reviewed RN Assessment  Last BM:  11/25  Height:   Ht Readings from Last 1 Encounters:  06/23/2020 5' (1.524 m)    Weight:   Wt Readings from Last 1 Encounters:  07/15/20 72.3 kg    Ideal Body Weight:  45.5 kg  BMI:  Body mass index is 31.13 kg/m.  Estimated Nutritional Needs:   Kcal:  1560  Protein:  100-120 gm  Fluid:  1.5-1.7 L    Lucas Mallow, RD, LDN, CNSC Please refer to Amion for contact information.

## 2020-07-15 NOTE — Progress Notes (Signed)
Subjective: TIPS yesterday. No further hematemesis or blood in stool.  Objective: Vital signs in last 24 hours: Temp:  [97.9 F (36.6 C)-100.6 F (38.1 C)] 99.1 F (37.3 C) (11/26 1200) Pulse Rate:  [92-114] 103 (11/26 1200) Resp:  [13-26] 22 (11/26 1200) BP: (106-146)/(32-88) 146/47 (11/26 1200) SpO2:  [97 %-100 %] 98 % (11/26 1200) Arterial Line BP: (94-142)/(14-61) 125/61 (11/26 1200) FiO2 (%):  [40 %-100 %] 40 % (11/26 1108) Weight:  [72.3 kg] 72.3 kg (11/26 0500) Weight change: 1.085 kg Last BM Date: 07/04/2020  PE: GEN:  Intubated, sedated ABD:  Soft, mild distended  Lab Results: CBC    Component Value Date/Time   WBC 26.3 (H) 07/15/2020 0224   RBC 3.70 (L) 07/15/2020 0224   HGB 11.5 (L) 07/15/2020 0224   HGB 15.5 10/27/2019 1109   HCT 33.2 (L) 07/15/2020 0224   HCT 46.6 10/27/2019 1109   PLT PLATELET CLUMPS NOTED ON SMEAR, UNABLE TO ESTIMATE 07/15/2020 0224   PLT 69 (LL) 10/27/2019 1109   MCV 89.7 07/15/2020 0224   MCV 91 10/27/2019 1109   MCH 31.1 07/15/2020 0224   MCHC 34.6 07/15/2020 0224   RDW 15.6 (H) 07/15/2020 0224   RDW 12.7 10/27/2019 1109   LYMPHSABS 1.0 07/10/2020 1929   LYMPHSABS 0.8 06/19/2019 0906   MONOABS 1.1 (H) 07/09/2020 1929   EOSABS 0.2 07/19/2020 1929   EOSABS 0.1 06/19/2019 0906   BASOSABS 0.1 07/07/2020 1929   BASOSABS 0.1 06/19/2019 0906   CMP     Component Value Date/Time   NA 138 07/15/2020 0225   NA 136 10/27/2019 1108   K 4.3 07/15/2020 0225   CL 104 07/15/2020 0225   CO2 20 (L) 07/15/2020 0225   GLUCOSE 253 (H) 07/15/2020 0225   BUN 45 (H) 07/15/2020 0225   BUN 15 10/27/2019 1108   CREATININE 1.60 (H) 07/15/2020 0225   CREATININE 0.82 05/02/2016 0758   CALCIUM 8.3 (L) 07/15/2020 0225   PROT 4.8 (L) 07/15/2020 1009   PROT 7.4 06/19/2019 0906   ALBUMIN 2.6 (L) 07/15/2020 1009   ALBUMIN 3.8 06/19/2019 0906   AST 80 (H) 07/15/2020 1009   ALT 49 (H) 07/15/2020 1009   ALKPHOS 88 07/15/2020 1009   BILITOT 1.4 (H)  07/15/2020 1009   BILITOT 1.2 06/19/2019 0906   GFRNONAA 32 (L) 07/15/2020 0225   GFRNONAA 68 05/02/2016 0758   GFRAA 57 (L) 10/27/2019 1108   GFRAA 79 05/02/2016 0758   Assessment:  1.  Acute esophageal variceal bleeding, s/p (failed) EGD, s/p TIPS placement. 2.  Cirrhosis, suspected NAFLD. 3.  Anemia, acute blood loss. 4.  COPD/CAD.  Plan:  1.  Supportive care (PPI, IVF, serial CBCs, transfuse if needed). 2.  No need for octreotide s/p TIPS. 3.  [facility] will revisit Monday.   Landry Dyke 07/15/2020, 12:27 PM   Cell [phone] If no answer or after 5 PM call [phone]

## 2020-07-15 NOTE — Progress Notes (Signed)
EEG Completed; Results Pending  

## 2020-07-15 NOTE — Procedures (Signed)
Patient Name: [name]  MRN: [id number]  Epilepsy Attending: Lora Havens  Referring Physician/Provider: Dr. Ina Homes Date: 07/15/2020 Duration: 24.37 minutes  Patient history: 86 year old female with altered mental status.  EEG to evaluate for seizures.  Level of alertness: Comatose  AEDs during EEG study: Propofol  Technical aspects: This EEG study was done with scalp electrodes positioned according to the 10-20 International system of electrode placement. Electrical activity was acquired at a sampling rate of 500Hz  and reviewed with a high frequency filter of 70Hz  and a low frequency filter of 1Hz . EEG data were recorded continuously and digitally stored.   Description: EEG showed continuous generalized background attenuation.  EEG was not reactive to tactile stimulation.  Hyperventilation and photic stimulation were not performed.     ABNORMALITY -Background attenuation, generalized  IMPRESSION: This study is suggestive of profound diffuse encephalopathy, nonspecific etiology. No seizures or epileptiform discharges were seen throughout the recording.  [name] [name]

## 2020-07-15 NOTE — Progress Notes (Addendum)
Inpatient Diabetes Program Recommendations  AACE/ADA: New Consensus Statement on Inpatient Glycemic Control (2015)  Target Ranges:  Prepandial:   less than 140 mg/dL      Peak postprandial:   less than 180 mg/dL (1-2 hours)      Critically ill patients:  140 - 180 mg/dL   Results for ERRICKA, FALKNER (MRN [id number]) as of 07/15/2020 07:25  Ref. Range 07/17/2020 02:09 07/15/2020 03:10 06/21/2020 07:31 06/25/2020 15:49 07/11/2020 19:10  Glucose-Capillary Latest Ref Range: 70 - 99 mg/dL 258 (H) 351 (H)  15 units NOVOLOG  319 (H)  11 units NOVOLOG  20 units LANTUS @10 :16am  287 (H)  8 units NOVOLOG  271 (H)  8 units NOVOLOG    Results for DEJANA, PUGSLEY (MRN [id number]) as of 07/15/2020 07:25  Ref. Range 07/18/2020 23:05 07/15/2020 03:06  Glucose-Capillary Latest Ref Range: 70 - 99 mg/dL 262 (H)  8 units NOVOLOG  220 (H)  5 units NOVOLOG    Results for LEVA, BAINE (MRN [id number]) as of 07/15/2020 07:25  Ref. Range 10/15/2019 05:20 07/11/2020 22:09  Hemoglobin A1C Latest Ref Range: 4.8 - 5.6 % 7.9 (H) 8.2 (H)    Admit with: Syncope and hematemesis/ large esophageal varices with bleeding at GE junction  History: DM, NASH cirrhosis and varices   Home DM Meds: Lantus 30 units QHS       Novolog 15 units BID  Current Orders: Novolog Moderate Correction Scale/ SSI (0-15 units) Q4 hours    MD- Note patient takes Lantus 30 units QHS at home.  Was given 20 units Lantus X 1 dose yesterday at 10am.  Lantus has since been discontinued.  CBGs >200 since Midnight.  Please consider adding back Lantus to inpatient insulin regimen.  If concern for Hypoglycemia since pt NPO, can split the dose so later dose can be held if pt develops issues with hypoglycemia: Lantus 15 units BID    --Will follow patient during hospitalization--  Wyn Quaker RN, MSN, CDE Diabetes Coordinator Inpatient Glycemic Control Team Team Pager: [phone] (8a-5p)

## 2020-07-16 DIAGNOSIS — R578 Other shock: Secondary | ICD-10-CM | POA: Diagnosis not present

## 2020-07-16 DIAGNOSIS — Z515 Encounter for palliative care: Secondary | ICD-10-CM

## 2020-07-16 DIAGNOSIS — I8501 Esophageal varices with bleeding: Secondary | ICD-10-CM

## 2020-07-16 DIAGNOSIS — K274 Chronic or unspecified peptic ulcer, site unspecified, with hemorrhage: Secondary | ICD-10-CM | POA: Diagnosis not present

## 2020-07-16 LAB — CBC
HCT: 27.2 % — ABNORMAL LOW (ref 36.0–46.0)
HCT: 26.1 % — ABNORMAL LOW (ref 36.0–46.0)
HCT: 27.5 % — ABNORMAL LOW (ref 36.0–46.0)
Hemoglobin: 8.8 g/dL — ABNORMAL LOW (ref 12.0–15.0)
Hemoglobin: 8.3 g/dL — ABNORMAL LOW (ref 12.0–15.0)
Hemoglobin: 8.8 g/dL — ABNORMAL LOW (ref 12.0–15.0)
MCH: 30.7 pg (ref 26.0–34.0)
MCH: 30.1 pg (ref 26.0–34.0)
MCH: 30.3 pg (ref 26.0–34.0)
MCHC: 32.4 g/dL (ref 30.0–36.0)
MCHC: 31.8 g/dL (ref 30.0–36.0)
MCHC: 32 g/dL (ref 30.0–36.0)
MCV: 94.8 fL (ref 80.0–100.0)
MCV: 94.6 fL (ref 80.0–100.0)
MCV: 94.8 fL (ref 80.0–100.0)
Platelets: 45 10*3/uL — ABNORMAL LOW (ref 150–400)
Platelets: 37 10*3/uL — ABNORMAL LOW (ref 150–400)
Platelets: 49 10*3/uL — ABNORMAL LOW (ref 150–400)
RBC: 2.87 MIL/uL — ABNORMAL LOW (ref 3.87–5.11)
RBC: 2.76 MIL/uL — ABNORMAL LOW (ref 3.87–5.11)
RBC: 2.9 MIL/uL — ABNORMAL LOW (ref 3.87–5.11)
RDW: 16.6 % — ABNORMAL HIGH (ref 11.5–15.5)
RDW: 16.8 % — ABNORMAL HIGH (ref 11.5–15.5)
RDW: 16.7 % — ABNORMAL HIGH (ref 11.5–15.5)
WBC: 9.2 10*3/uL (ref 4.0–10.5)
WBC: 6.6 10*3/uL (ref 4.0–10.5)
WBC: 8.1 10*3/uL (ref 4.0–10.5)
nRBC: 0 % (ref 0.0–0.2)
nRBC: 0 % (ref 0.0–0.2)
nRBC: 0 % (ref 0.0–0.2)

## 2020-07-16 LAB — POCT I-STAT 7, (LYTES, BLD GAS, ICA,H+H)
Acid-base deficit: 9 mmol/L — ABNORMAL HIGH (ref 0.0–2.0)
Acid-base deficit: 7 mmol/L — ABNORMAL HIGH (ref 0.0–2.0)
Acid-base deficit: 4 mmol/L — ABNORMAL HIGH (ref 0.0–2.0)
Acid-base deficit: 6 mmol/L — ABNORMAL HIGH (ref 0.0–2.0)
Acid-base deficit: 2 mmol/L (ref 0.0–2.0)
Bicarbonate: 19.7 mmol/L — ABNORMAL LOW (ref 20.0–28.0)
Bicarbonate: 19.1 mmol/L — ABNORMAL LOW (ref 20.0–28.0)
Bicarbonate: 20.5 mmol/L (ref 20.0–28.0)
Bicarbonate: 20 mmol/L (ref 20.0–28.0)
Bicarbonate: 20.4 mmol/L (ref 20.0–28.0)
Calcium, Ion: 1.15 mmol/L (ref 1.15–1.40)
Calcium, Ion: 1.12 mmol/L — ABNORMAL LOW (ref 1.15–1.40)
Calcium, Ion: 1.1 mmol/L — ABNORMAL LOW (ref 1.15–1.40)
Calcium, Ion: 1.12 mmol/L — ABNORMAL LOW (ref 1.15–1.40)
Calcium, Ion: 1.18 mmol/L (ref 1.15–1.40)
HCT: 41 % (ref 36.0–46.0)
HCT: 38 % (ref 36.0–46.0)
HCT: 34 % — ABNORMAL LOW (ref 36.0–46.0)
HCT: 34 % — ABNORMAL LOW (ref 36.0–46.0)
HCT: 26 % — ABNORMAL LOW (ref 36.0–46.0)
Hemoglobin: 13.9 g/dL (ref 12.0–15.0)
Hemoglobin: 12.9 g/dL (ref 12.0–15.0)
Hemoglobin: 11.6 g/dL — ABNORMAL LOW (ref 12.0–15.0)
Hemoglobin: 11.6 g/dL — ABNORMAL LOW (ref 12.0–15.0)
Hemoglobin: 8.8 g/dL — ABNORMAL LOW (ref 12.0–15.0)
O2 Saturation: 100 %
O2 Saturation: 100 %
O2 Saturation: 100 %
O2 Saturation: 100 %
O2 Saturation: 98 %
Patient temperature: 35.2
Patient temperature: 100
Potassium: 4.4 mmol/L (ref 3.5–5.1)
Potassium: 4.3 mmol/L (ref 3.5–5.1)
Potassium: 4.2 mmol/L (ref 3.5–5.1)
Potassium: 4.7 mmol/L (ref 3.5–5.1)
Potassium: 4.3 mmol/L (ref 3.5–5.1)
Sodium: 135 mmol/L (ref 135–145)
Sodium: 136 mmol/L (ref 135–145)
Sodium: 137 mmol/L (ref 135–145)
Sodium: 137 mmol/L (ref 135–145)
Sodium: 149 mmol/L — ABNORMAL HIGH (ref 135–145)
TCO2: 21 mmol/L — ABNORMAL LOW (ref 22–32)
TCO2: 20 mmol/L — ABNORMAL LOW (ref 22–32)
TCO2: 22 mmol/L (ref 22–32)
TCO2: 21 mmol/L — ABNORMAL LOW (ref 22–32)
TCO2: 21 mmol/L — ABNORMAL LOW (ref 22–32)
pCO2 arterial: 51.4 mmHg — ABNORMAL HIGH (ref 32.0–48.0)
pCO2 arterial: 39.7 mmHg (ref 32.0–48.0)
pCO2 arterial: 36.2 mmHg (ref 32.0–48.0)
pCO2 arterial: 38 mmHg (ref 32.0–48.0)
pCO2 arterial: 27.3 mmHg — ABNORMAL LOW (ref 32.0–48.0)
pH, Arterial: 7.192 — CL (ref 7.350–7.450)
pH, Arterial: 7.291 — ABNORMAL LOW (ref 7.350–7.450)
pH, Arterial: 7.361 (ref 7.350–7.450)
pH, Arterial: 7.322 — ABNORMAL LOW (ref 7.350–7.450)
pH, Arterial: 7.484 — ABNORMAL HIGH (ref 7.350–7.450)
pO2, Arterial: 568 mmHg — ABNORMAL HIGH (ref 83.0–108.0)
pO2, Arterial: 284 mmHg — ABNORMAL HIGH (ref 83.0–108.0)
pO2, Arterial: 255 mmHg — ABNORMAL HIGH (ref 83.0–108.0)
pO2, Arterial: 416 mmHg — ABNORMAL HIGH (ref 83.0–108.0)
pO2, Arterial: 105 mmHg (ref 83.0–108.0)

## 2020-07-16 LAB — GLUCOSE, CAPILLARY
Glucose-Capillary: 189 mg/dL — ABNORMAL HIGH (ref 70–99)
Glucose-Capillary: 192 mg/dL — ABNORMAL HIGH (ref 70–99)
Glucose-Capillary: 251 mg/dL — ABNORMAL HIGH (ref 70–99)
Glucose-Capillary: 275 mg/dL — ABNORMAL HIGH (ref 70–99)
Glucose-Capillary: 282 mg/dL — ABNORMAL HIGH (ref 70–99)
Glucose-Capillary: 372 mg/dL — ABNORMAL HIGH (ref 70–99)
Glucose-Capillary: 376 mg/dL — ABNORMAL HIGH (ref 70–99)
Glucose-Capillary: 387 mg/dL — ABNORMAL HIGH (ref 70–99)
Glucose-Capillary: 389 mg/dL — ABNORMAL HIGH (ref 70–99)
Glucose-Capillary: 443 mg/dL — ABNORMAL HIGH (ref 70–99)

## 2020-07-16 LAB — BASIC METABOLIC PANEL
Anion gap: 19 — ABNORMAL HIGH (ref 5–15)
BUN: 47 mg/dL — ABNORMAL HIGH (ref 8–23)
CO2: 18 mmol/L — ABNORMAL LOW (ref 22–32)
Calcium: 8.8 mg/dL — ABNORMAL LOW (ref 8.9–10.3)
Chloride: 109 mmol/L (ref 98–111)
Creatinine, Ser: 1.42 mg/dL — ABNORMAL HIGH (ref 0.44–1.00)
GFR, Estimated: 37 mL/min — ABNORMAL LOW (ref >60)
Glucose, Bld: 440 mg/dL — ABNORMAL HIGH (ref 70–99)
Potassium: 4 mmol/L (ref 3.5–5.1)
Sodium: 146 mmol/L — ABNORMAL HIGH (ref 135–145)

## 2020-07-16 LAB — HEPATIC FUNCTION PANEL
ALT: 78 U/L — ABNORMAL HIGH (ref 0–44)
AST: 109 U/L — ABNORMAL HIGH (ref 15–41)
Albumin: 3.7 g/dL (ref 3.5–5.0)
Alkaline Phosphatase: 86 U/L (ref 38–126)
Bilirubin, Direct: 1 mg/dL — ABNORMAL HIGH (ref 0.0–0.2)
Indirect Bilirubin: 1 mg/dL — ABNORMAL HIGH (ref 0.3–0.9)
Total Bilirubin: 2 mg/dL — ABNORMAL HIGH (ref 0.3–1.2)
Total Protein: 5.5 g/dL — ABNORMAL LOW (ref 6.5–8.1)

## 2020-07-16 LAB — TRIGLYCERIDES: Triglycerides: 162 mg/dL — ABNORMAL HIGH (ref <150)

## 2020-07-16 MED ORDER — INSULIN ASPART 100 UNIT/ML ~~LOC~~ SOLN
0.0000 [IU] | SUBCUTANEOUS | Status: DC
  Administered 2020-07-16 (×3): 20 [IU] via SUBCUTANEOUS

## 2020-07-16 MED ORDER — FENTANYL CITRATE (PF) 100 MCG/2ML IJ SOLN
50.0000 ug | INTRAMUSCULAR | Status: DC | PRN
  Administered 2020-07-18: 50 ug via INTRAVENOUS
  Filled 2020-07-16: qty 2

## 2020-07-16 MED ORDER — INSULIN ASPART 100 UNIT/ML ~~LOC~~ SOLN
6.0000 [IU] | SUBCUTANEOUS | Status: DC
  Administered 2020-07-16 (×3): 6 [IU] via SUBCUTANEOUS

## 2020-07-16 MED ORDER — DEXMEDETOMIDINE HCL IN NACL 400 MCG/100ML IV SOLN: 0.4000 ug/kg/h | INTRAVENOUS | Status: DC

## 2020-07-16 MED ORDER — DEXTROSE 50 % IV SOLN: 0.0000 mL | INTRAVENOUS | Status: DC | PRN

## 2020-07-16 MED ORDER — LACTATED RINGERS IV SOLN: INTRAVENOUS | Status: DC

## 2020-07-16 MED ORDER — INSULIN DETEMIR 100 UNIT/ML ~~LOC~~ SOLN
45.0000 [IU] | Freq: Two times a day (BID) | SUBCUTANEOUS | Status: DC
  Administered 2020-07-16: 45 [IU] via SUBCUTANEOUS
  Filled 2020-07-16 (×2): qty 0.45

## 2020-07-16 MED ORDER — INSULIN REGULAR(HUMAN) IN NACL 100-0.9 UT/100ML-% IV SOLN
INTRAVENOUS | Status: DC
  Administered 2020-07-16: 9.5 [IU]/h via INTRAVENOUS
  Administered 2020-07-17: 4.6 [IU]/h via INTRAVENOUS
  Filled 2020-07-16 (×3): qty 100

## 2020-07-16 MED ORDER — FREE WATER
200.0000 mL | Status: DC
  Administered 2020-07-16 – 2020-07-17 (×5): 200 mL

## 2020-07-16 MED ORDER — DEXTROSE IN LACTATED RINGERS 5 % IV SOLN: INTRAVENOUS | Status: DC

## 2020-07-16 NOTE — Progress Notes (Signed)
NAME:  [name], MRN:  [id number], DOB:  09-Jan-1934, LOS: 3 ADMISSION DATE:  06/25/2020, CONSULTATION DATE:  07/16/20 REFERRING MD: EDP  , CHIEF COMPLAINT:  GIB   Brief History   86 year old female with a past medical history of [name] cirrhosis and esophageal varices COPD, CAD diabetes hyperlipidemia presented with syncope and hematemesis.  She became increasingly hypotensive with repeated hematemesis, so was intubated and plan for emergent EGD and transfusion.  History of present illness   [name] is an 86 year old female with a past medical history of [name] cirrhosis and esophageal varices COPD, CAD,  Diabetes,  hyperlipidemia who was in her usual state of health until she went to the bathroom and became lightheaded and syncopized.  When she woke up she began vomiting bright red blood.  EMS found patient hypotensive and weak.  She states she has recently had dark stools, but takes iron so this is normal for her.  Patient's last EGD was in 2016 and was diagnosed with grade 2 esophageal varices and portal hypertensive gastropathy.  She is not on aspirin, blood thinners or PPIs.  He is not sure if she is taking any OTC NSAIDs.   In the ED, patient's blood pressure initially improved with 500 cc bolus.  Hemoglobin was 11.4, this down trended to 9.7.   She also had several episodes of large-volume hematemesis and worsening hypotension so was intubated and started on Levophed.  PCCM consulted for admission and gastroenterology present to perform emergent EGD.   Past Medical History   has a past medical history of Cirrhosis, non-alcoholic ([place]), COPD (chronic obstructive pulmonary disease) ([place]), Coronary artery disease, Diabetes mellitus, Dyslipidemia, Fall, Hypotension (08/02/2010), Hypothyroidism, Leg edema, Legally blind, Myocardial infarct, old, Neuropathy of hand, Obesity, and Sleep apnea.   Significant Hospital Events   11/25 Admit to PCCM  Consults:  Gastroenterology  Procedures:    11/25 CVC by EDP 11/25 ETT, EGD/banding 11/25 [place] (since removed), emergent TIPS  Significant Diagnostic Tests:  11/24 CXR>> lungs clear and ETT in good position  Micro Data:  11/24 Covid-19 and flu>> negative  Antimicrobials:  Ceftriaxone 11/25-   Interim history/subjective:  No events. EEG w/o seizures.  Objective   Blood pressure (!) 130/57, pulse 94, temperature 99.1 F (37.3 C), temperature source Oral, resp. rate (!) 32, height 5' (1.524 m), weight 74.6 kg, SpO2 98 %.    Vent Mode: PRVC FiO2 (%):  [40 %] 40 % Set Rate:  [26 bmp] 26 bmp Vt Set:  [360 mL] 360 mL PEEP:  [5 cmH20] 5 cmH20 Plateau Pressure:  [15 cmH20-21 cmH20] 15 cmH20   Intake/Output Summary (Last 24 hours) at 07/16/2020 0657 Last data filed at 07/16/2020 0600 Gross per 24 hour  Intake 2238.8 ml  Output 1865 ml  Net 373.8 ml   Filed Weights   06/29/2020 0200 07/15/20 0500 07/16/20 0430  Weight: 72.5 kg 72.3 kg 74.6 kg   Constitutional: no acute distress on vent  Eyes: pupils reactive not tracking Ears, nose, mouth, and throat: ETT in place, minimal secretions Cardiovascular: RRR, ext warm Respiratory: tachypneic, triggering vent Gastrointestinal: Soft, +BS, rectal tube in place Skin: No rashes, normal turgor Neurologic: unresponsive Psychiatric: RASS -5  Na up Cr stable High sugars   Resolved Hospital Problem list     Assessment & Plan:  Hemorrhagic Shock secondary to variceal bleed, NASH cirrhosis- s/p emergent EGD and TIPS.  H/H stable, no signs of ongoing bleeding. - Monitor H/H - PPI  Severe hepatic encephalopathy-  ammonia 380 after TIPS - Continue lactulose, rifaximin - Titrate to 3+ loose BM a day  Acute hypoxemic respiratory failure- on vent - AM CXR - VAP prevention bundle - Mental status precludes extubation  Acute kidney injury, hypernatremia - Avoid nephrotoxins, try to keep fluid neutral - Start FWF  DM2 with hyperglycemia- still an issue, strengthen basal  bolus  GOC- discussed frankly with family, we will work on treating hepatic encephalopathy, if she does not wake up in a few days we will consider compassionate extubation; appreciate palliative establishing relationship to guide this process  Best practice (evaluated daily)   Diet: increase to goal Pain/Anxiety/Delirium protocol (if indicated): wean VAP protocol (if indicated): in place DVT prophylaxis: SCD's GI prophylaxis: protonix Glucose control: SSI Mobility: bed rest last date of multidisciplinary goals of care discussion: Son updated at the bedside 11/25, wants full code and aggressive care Family and staff present: RN Summary of discussion  Follow up goals of care discussion due 12/2 Code Status: Full code Disposition: ICU   Patient critically ill due to respiratory failure, encephalopathy Interventions to address this today lactulose, rifaxamin, sedation weaning, vent weaning Risk of deterioration without these interventions is high  I personally spent 37 minutes providing critical care not including any separately billable procedures  Erskine Emery MD [facility] 07/16/2020 6:57 AM Personal pager: [phone] If unanswered, please page CCM On-call: [phone]

## 2020-07-16 NOTE — Progress Notes (Signed)
Wasted Fentanyl 178m with SWaymon Budge RN.

## 2020-07-16 NOTE — Consult Note (Signed)
Consultation Note Date: 07/16/2020   Patient Name: [name]  DOB: 1934-02-15  MRN: [id number]  Age / Sex: 86 y.o., female  PCP: System, Provider Not In Referring Physician: Candee Furbish, MD  Reason for Consultation: Establishing goals of care  HPI/Patient Profile: 86 y.o. female  with past medical history of NASH cirrhosis with esophageal varices, COPD, CAD, blindness, DM who was admitted on 07/06/2020 with syncope and hematemesis.  Shortly after admission the patient developed further hematemesis and required intubation.  EGD with banding was done but did not stop the bleeding.  Emergent TIPS procedure was performed.  Patient suffered hemorrhagic shock and went to the ICU intubated, on pressors, and sedated.  Ammonia level was 382.  WBC 26.3.  Clinical Assessment and Goals of Care:  I have reviewed medical records including EPIC notes, labs and imaging, received report from Dr. Tamala Julian of CCM and the ICU RN, examined the patient and spoke briefly on the phone to [name] (brother).  He directed me to his sister, [name], as the primary surrogate decision maker.  This morning I met with [name] and we had a good conversation about her Mom.  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  She has also been the primary care taker for both of her parents for many years.  Her father died with Parkinsons and dementia years ago.  Her mother has suffered with DM for many years and NASH over the last 4-5 years.    I introduced Palliative Medicine as specialized medical care for people living with serious illness. It focuses on providing relief from the symptoms and stress of a serious illness.   We discussed a brief life review of the patient. Mrs. Reichardt is an independent lady.  Despite being legally blind and hard of hearing she lived on her own  and thrived.  [name] manages her finances and assists but her mother has remained independent with ADLs.  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   When the children were growing up, Mrs. Valvo traveled with their father who was a Garment/textile technologist.   Mrs. Citron is a spiritual woman of the [place] and still loves [place] gospel.  Finally [name] shares that her mother loves her two dogs - they bring her great joy.  We discussed her current illness and what it means in the larger context of her on-going co-morbidities.  Natural disease trajectory and expectations at EOL were discussed.  [name] is very realistic.  She and her mother had discussions and [name] has a good understanding of her mother's wishes.  She explains that her mother would want a trial of heroic measures to save her life if possible.  However she would not want to linger on life support if she was not improving or if she  was suffering.  [name] feels her mother would want to be a DNR at this point - if she arrested despite all current measures she would not want chest compressions or aggressive resuscitation.  The difference between aggressive medical intervention and comfort care was considered in light of the patient's goals of care. Advanced directives, concepts specific to code status, artifical feeding and hydration, and rehospitalization were considered and discussed.  [name] completed a pink MOST form.  We discussed hopes for improvement with continued full scope treatment.  We also discussed what comfort measures may look like.     If improvements such as being able to wean pressors and starting to become awake are not seen the next 2-3 days, perhaps it will be time for further discussion and to consider comfort at that point.  Questions and concerns were addressed.  The family  was encouraged to call with questions or concerns.    Primary Decision Maker:  NEXT OF KIN  [name] is the Health Care decision making surrogate.    SUMMARY OF RECOMMENDATIONS    Code Status/Advance Care Planning:  DNR   Symptom Management:   Continue full scope treatment for now.  [name] describes her mother's chronic low back pain and arthritis pain in her right ring finger.  Additional Recommendations (Limitations, Scope, Preferences):  Full Scope Treatment  Palliative Prophylaxis:   Frequent Pain Assessment and Turn Reposition  Psycho-social/Spiritual:   Desire for further Chaplaincy support: welcomed.  Prognosis:  To be determined.    Discharge Planning: To Be Determined      Primary Diagnoses: Present on Admission: . COPD (chronic obstructive pulmonary disease) ([place]) . Coronary artery disease . Gastroesophageal reflux disease . Hypertension associated with diabetes ([place]) . Hypothyroidism . ILD (interstitial lung disease) ([place]) . Liver cirrhosis secondary to NASH ([place]) . Obstructive sleep apnea . Paroxysmal atrial fibrillation ([facility]) . Syncope and collapse . Hypotension . Hyponatremia . GI bleed . GIB (gastrointestinal bleeding)   I have reviewed the medical record, interviewed the patient and family, and examined the patient. The following aspects are pertinent.  Past Medical History:  Diagnosis Date  . Cirrhosis, non-alcoholic ([place])   . COPD (chronic obstructive pulmonary disease) ([place])   . Coronary artery disease    status post inferior wall myocardial infarction  . Diabetes mellitus   . Dyslipidemia   . Fall   . Hypotension 08/02/2010   recent episodes of orthostatic hypotension  . Hypothyroidism   . Leg edema   . Legally blind    left eye, partial blindness right eye per daughter  . Myocardial infarct, old   . Neuropathy of hand    left hand numbness/neuropathy  . Obesity   . Sleep apnea    Social History   Socioeconomic  History  . Marital status: Widowed    Spouse name: Not on file  . Number of children: 3  . Years of education: 72  . Highest education level: 12th grade  Occupational History  . Occupation: Retired  Tobacco Use  . Smoking status: Former Smoker    Quit date: 08/20/1973    Years since quitting: 46.9  . Smokeless tobacco: Never Used  Vaping Use  . Vaping Use: Never used  Substance and Sexual Activity  . Alcohol use: No  . Drug use: No  . Sexual activity: Not Currently  Other Topics Concern  . Not on file  Social History Narrative   Lives alone in one story home   Daughter [name] lives in  [place] and she works as  Technical brewer for an internal medicine clinic   Son lives nearby - he has diabetes and wears a Solicitor   Social Determinants of Health   Financial Resource Strain: Low Risk   . Difficulty of Paying Living Expenses: Not hard at all  Food Insecurity: No Food Insecurity  . Worried About Charity fundraiser in the Last Year: Never true  . Ran Out of Food in the Last Year: Never true  Transportation Needs: No Transportation Needs  . Lack of Transportation (Medical): No  . Lack of Transportation (Non-Medical): No  Physical Activity: Inactive  . Days of Exercise per Week: 0 days  . Minutes of Exercise per Session: 0 min  Stress: No Stress Concern Present  . Feeling of Stress : Only a little  Social Connections: Moderately Integrated  . Frequency of Communication with Friends and Family: More than three times a week  . Frequency of Social Gatherings with Friends and Family: More than three times a week  . Attends Religious Services: More than 4 times per year  . Active Member of Clubs or Organizations: No  . Attends Archivist Meetings: More than 4 times per year  . Marital Status: Widowed   Family History  Problem Relation Age of Onset  . Emphysema Brother        non smoker  . Diabetes Brother   . Hyperlipidemia Brother   . Hypertension Brother   .  Heart disease Father   . Heart attack Father   . Diabetes Father   . Hyperlipidemia Father   . Hypertension Father   . Diabetes Mother   . Diabetes Sister   . Hyperlipidemia Sister   . Hypertension Sister   . Heart attack Daughter   . Diabetes Daughter   . Diabetes Son   . Suicidality Son   . Alcohol abuse Son   . Alcohol abuse Maternal Uncle   . Heart attack Paternal Grandfather   . Diabetes Daughter   . Cancer Son        melanoma  . Diabetes Son   . Diabetes Son     Allergies  Allergen Reactions  . Ace Inhibitors Cough  . Benadryl [Diphenhydramine Hcl (Sleep)] Other (See Comments)    hypotension  . Fexofenadine Hcl Other (See Comments)  . Penicillins Hives and Swelling    Swelling of arms Has patient had a PCN reaction causing immediate rash, facial/tongue/throat swelling, SOB or lightheadedness with hypotension: unknown Has patient had a PCN reaction causing severe rash involving mucus membranes or skin necrosis: no Has patient had a PCN reaction that required hospitalization: unknown Has patient had a PCN reaction occurring within the last 10 years: no, childhood allergy If all of the above answers are "NO", then may proceed with Cephalosporin use.   . Sulfonamide Derivatives Swelling  . Sulfa Antibiotics Other (See Comments)    hypotension     Vital Signs: BP (!) 128/56   Pulse (!) 109   Temp 100.2 F (37.9 C) (Axillary)   Resp (!) 37   Ht 5' (1.524 m)   Wt 72.3 kg   SpO2 100%   BMI 31.13 kg/m  Pain Scale: CPOT   Pain Score: 0-No pain   SpO2: SpO2: 100 % O2 Device:SpO2: 100 % O2 Flow Rate: .     Palliative Assessment/Data: 10%     Time In: 10:20 Time Out: 11:30 Time Total: 70 min Visit consisted of counseling and education  dealing with the complex and emotionally intense issues surrounding the need for palliative care and symptom management in the setting of serious and potentially life-threatening illness. Greater than 50%  of this time was  spent counseling and coordinating care related to the above assessment and plan.  Signed by: Florentina Jenny, PA-C Palliative Medicine  Please contact Palliative Medicine Team phone at [phone] for questions and concerns.  For individual provider: See Shea Evans

## 2020-07-16 NOTE — Progress Notes (Addendum)
[place] Progress Note Patient Name: [name] DOB: [birth year] MRN: [id number]   Date of Service  07/16/2020  HPI/Events of Note  Called for RR 37/min on ventilator. Patient is on PRVC 360x26, 5, 40%. There is a marked pressure drop at the end of exhalation and her exhaled VTs are only 290-300cc (I.e. not meeting her set target VT).   eICU Interventions  I suspect the ventilator  is autotriggering due to cuff leak, resulting in findings of high delivered RR and low VT. Will have RT troubleshoot circuit and if not due to autotriggring we will obtain ABG to assess acid-base status.   ADDENDUM:  - ABG 7.48/27/105/21. - Recommended starting propofol for sedation given primary respiratory alkalosis.  Intervention Category Major Interventions: Respiratory failure - evaluation and management  Charlott Rakes 07/16/2020, 2:44 AM

## 2020-07-16 NOTE — Progress Notes (Signed)
Referring Physician(s): Dr. Serafina Royals  Supervising Physician: Dr. Ruthann Cancer  Patient Status:  [facility] - In-pt  Chief Complaint: GI bleed Esophageal varices NASH cirrhosis  Subjective: Patient intubated.   Patient does not arouse, encephalopathic On pressors.   Allergies: Ace inhibitors, Benadryl [diphenhydramine hcl (sleep)], Fexofenadine hcl, Penicillins, Sulfonamide derivatives, and Sulfa antibiotics  Medications: Prior to Admission medications   Medication Sig Start Date End Date Taking? Authorizing Provider  albuterol (VENTOLIN HFA) 108 (90 Base) MCG/ACT inhaler INHALE 2 PUFFS BY MOUTH EVERY 6 HOURS AS NEEDED FOR WHEEZING OR SHORTNESS OF BREATH Patient taking differently: Inhale 2 puffs into the lungs every 6 (six) hours as needed for wheezing or shortness of breath.  08/26/19  Yes Opalski, Deborah, DO  brimonidine (ALPHAGAN) 0.2 % ophthalmic solution Place 1 drop into both eyes daily. 03/04/19  Yes [provider]  Cholecalciferol (VITAMIN D3) 50 MCG (2000 UT) TABS Take 2,000 Units by mouth daily.    Yes [provider]  diltiazem (CARDIZEM CD) 120 MG 24 hr capsule Take 1 capsule (120 mg total) by mouth daily. 05/19/20  Yes Nahser, Wonda Cheng, MD  escitalopram (LEXAPRO) 20 MG tablet Take 1 tablet (20 mg total) by mouth daily. 05/19/20  Yes Abonza, Maritza, PA-C  furosemide (LASIX) 40 MG tablet Take 1 tablet by mouth in the morning daily.  You may take additional dose in the afternoon for edema/swelling 11/12/19  Yes Nahser, Wonda Cheng, MD  Insulin Aspart FlexPen 100 UNIT/ML SOPN INJECT 20 UNITS SUBCUTANEOUSLY THREE TIMES A DAY BEFORE MEALS; ADJUST AND INCREASE ACCORDINGLY. Patient taking differently: Inject 15 Units into the skin in the morning and at bedtime.  11/19/19  Yes Opalski, [name], DO  insulin glargine (LANTUS) 100 UNIT/ML injection Inject 0.3 mLs (30 Units total) into the skin at bedtime. 05/19/20  Yes Abonza, Maritza, PA-C  levocetirizine (XYZAL) 5 MG tablet  Take 1 tablet (5 mg total) by mouth every evening. During allergy season only 06/03/19  Yes Opalski, [name], DO  levothyroxine (SYNTHROID) 100 MCG tablet Take 1 tablet (100 mcg total) by mouth daily. 05/19/20  Yes Abonza, Maritza, PA-C  montelukast (SINGULAIR) 10 MG tablet Take 1 tablet (10 mg total) by mouth every evening. 05/19/20  Yes Abonza, Maritza, PA-C  Multiple Vitamins-Minerals (VISION FORMULA/LUTEIN) TABS Take 1 tablet by mouth daily.   Yes [provider]  nitroGLYCERIN (NITROSTAT) 0.4 MG SL tablet PLACE 1 TABLET UNDER THE TONGUE EVERY 5 MINUTES AS NEEDED FOR CHEST PAIN Patient taking differently: Place 0.4 mg under the tongue every 5 (five) minutes as needed for chest pain.  11/12/19  Yes Nahser, Wonda Cheng, MD  pantoprazole (PROTONIX) 20 MG tablet Take 1 tablet (20 mg total) by mouth 2 (two) times daily. I po qam can add I po qpm prn 05/19/20  Yes Abonza, Maritza, PA-C  RESTASIS 0.05 % ophthalmic emulsion Place 1 drop into both eyes 2 (two) times daily.  12/31/18  Yes [provider]  rifaximin (XIFAXAN) 550 MG TABS tablet Take 550 mg by mouth 2 (two) times daily.   Yes [provider]  spironolactone (ALDACTONE) 100 MG tablet Take 0.5 tablets (50 mg total) by mouth in the morning. Patient taking differently: Take 50 mg by mouth in the morning. 1/2 tab on m, w, f 04/05/20  Yes Abonza, Maritza, PA-C  ursodiol (ACTIGALL) 500 MG tablet Take 500 mg by mouth in the morning and at bedtime.   Yes [provider]  Blood Glucose Monitoring Suppl (FREESTYLE LITE) DEVI  Use to check blood sugars 2-3 times daily 01/07/17   Mellody Dance, DO  Continuous Blood Gluc Receiver (FREESTYLE LIBRE 14 DAY READER) DEVI USE TO MONITOR BLOOD SUGAR 3 TIMES DAILY 10/13/19   Opalski, [name], DO  Continuous Blood Gluc Sensor (FREESTYLE LIBRE SENSOR SYSTEM) MISC Use to monitor blood sugars TID 08/17/19   Opalski, [name], DO  Lancets (ONETOUCH DELICA PLUS [id number]) MISC USE TO CHECK  BLOOD SUGAR THREE TIMES A DAY 11/12/19   Opalski, [name], DO  UNIFINE PENTIPS 31G X 5 MM MISC USE TO INJECT INSULIN 5 TIMES A DAY 07/01/19   Opalski, Deborah, DO     Vital Signs: BP (!) 112/40   Pulse 93   Temp 98.5 F (36.9 C) (Axillary)   Resp (!) 32   Ht 5' (1.524 m)   Wt 164 lb 7.4 oz (74.6 kg)   SpO2 95%   BMI 32.12 kg/m   Physical Exam  Intubated, not alert, not interactive, does not arouse with stimulation Neck: procedure site intact, no bleeding Mouth: no old blood present Abdomen: soft, no distention.  Procedure site intact, clean, and dry. Rectal tube with some reddish discoloration   Imaging: EEG  Result Date: 07/15/2020 Lora Havens, MD     07/15/2020  9:41 AM Patient Name: [name] MRN: [id number] Epilepsy Attending: Lora Havens Referring Physician/Provider: Dr. Ina Homes Date: 07/15/2020 Duration: 24.37 minutes Patient history: 86 year old female with altered mental status.  EEG to evaluate for seizures. Level of alertness: Comatose AEDs during EEG study: Propofol Technical aspects: This EEG study was done with scalp electrodes positioned according to the 10-20 International system of electrode placement. Electrical activity was acquired at a sampling rate of 500Hz  and reviewed with a high frequency filter of 70Hz  and a low frequency filter of 1Hz . EEG data were recorded continuously and digitally stored. Description: EEG showed continuous generalized background attenuation.  EEG was not reactive to tactile stimulation.  Hyperventilation and photic stimulation were not performed.   ABNORMALITY -Background attenuation, generalized IMPRESSION: This study is suggestive of profound diffuse encephalopathy, nonspecific etiology. No seizures or epileptiform discharges were seen throughout the recording. Lora Havens   DG Abd 1 View  Result Date: 07/19/2020 CLINICAL DATA:  NG tube placement EXAM: ABDOMEN - 1 VIEW COMPARISON:  07/07/2020 FINDINGS:  Interval placement of nasogastric tube with tip below the GE junction in the expected location of the body of stomach. No dilated loops of bowel identified. IMPRESSION: Interval placement of nasogastric tube with tip below the GE junction. Electronically Signed   By: Kerby Moors M.D.   On: 06/24/2020 09:55   CT ANGIO ABDOMEN W &/OR WO CONTRAST  Result Date: 06/29/2020 CLINICAL DATA:  Cirrhosis, variceal bleeding, evaluate for gastric varices, preop planning TIPS EXAM: CT ANGIOGRAPHY ABDOMEN TECHNIQUE: Multidetector CT imaging of the abdomen was performed using the standard protocol before and during bolus administration of intravenous contrast. Multiplanar reconstructed images and MIPs were obtained and reviewed to evaluate the vascular anatomy. CONTRAST:  126m OMNIPAQUE IOHEXOL 300 MG/ML  SOLN COMPARISON:  04/24/2012 FINDINGS: VASCULAR Aorta: Moderate calcified atheromatous plaque particularly in the infrarenal segment. No aneurysm, dissection, or stenosis. Celiac: Calcified ostial plaque without stenosis. Distal branching unremarkable. SMA: Patent. Replaced right hepatic arterial supply, an anatomic variant. Renals: Single left, mildly atheromatous without high-grade stenosis. Single right, patent. IMA: Patent without evidence of aneurysm, dissection, vasculitis or significant stenosis. Inflow: Visualized proximal common iliac arteries mildly atheromatous, patent without aneurysm. Veins: Patent hepatic veins, portal  vein, SMV, IMV, splenic vein. Enlarged coronary vein supplies partially thrombosed esophageal varices which extend to the GE junction. No significant gastric fundal varices. No significant splenorenal shunt. Review of the MIP images confirms the above findings. NON-VASCULAR Lower chest: No pleural or pericardial effusion. Hepatobiliary: Nodule hepatic contour without focal lesion or biliary ductal dilatation. Previous cholecystectomy. Ectatic CBD measured up to 1.1 cm diameter, seen down to  the ampulla. Pancreas: Mild diffuse atrophy without mass or ductal dilatation. Spleen: Splenomegaly measured up to 13.4 cm.  No focal lesion. Adrenals/Urinary Tract: Mild bilateral adrenal hyperplasia. Bilateral renal cysts. No hydronephrosis or urolithiasis. Stomach/Bowel: Stomach is partially distended, with hyperdense intraluminal material in the fundus consistent with thrombus. Visualized small bowel and colon are nondilated. Lymphatic: Stable prominent subcentimeter aortocaval and periportal lymph nodes. No mesenteric adenopathy. Other: Small volume perihepatic and perisplenic ascites. No free air. Musculoskeletal: Small paraumbilical hernia containing only mesenteric fat. No fracture or worrisome bone lesion. IMPRESSION: 1. Cirrhosis with splenomegaly and small volume ascites. 2. Partially thrombosed esophageal varices. No significant gastric fundal varices or splenorenal shunt. Aortic Atherosclerosis (ICD10-I70.0). Electronically Signed   By: Lucrezia Europe M.D.   On: 06/20/2020 08:09   IR Tips  Result Date: 07/10/2020 CLINICAL DATA:  Cirrhosis, portal venous hypertension, recurrent variceal bleeding despite endoscopic intervention, requiring transfusion and pressor support due to hypotension. EXAM: 1. TIPS CREATION 6 2. ULTRASOUND-GUIDED VENOUS ACCESS 3. PARACENTESIS WITH ULTRASOUND GUIDANCE ANESTHESIA/SEDATION: General - as administered by the Anesthesia department MEDICATIONS: See anesthesia record FLUOROSCOPY TIME:  25 minutes; 161 mGy COMPLICATIONS: Transient arrhythmia and hypotension during right atrial traversal, responded to interventions. SIR level B: Nominal therapy (including overnight admission for observation), no consequence. PROCEDURE: Informed written consent was obtained from the daughter (as the patient was sedated and intubated) after a thorough discussion of the procedural risks, benefits and alternatives. All questions were addressed. See previous consultation. Maximal Sterile Barrier  Technique was utilized including caps, mask, sterile gowns, sterile gloves, sterile drape, hand hygiene and skin antiseptic. A timeout was performed prior to the initiation of the procedure. The skin overlying the right upper abdominal quadrant as well as the right neck and femoral region were prepped and draped in usual sterile fashion. Initial ultrasound scanning confirms small volume perihepatic ascites. Operators: Shawnie Dapper Under ultrasound guidance, a 5 French 7 cm Yueh sheath needle was advanced into the peritoneal cavity from a right lateral approach for paracentesis removing yellow ascites. Next, the right internal jugular vein was accessed under direct ultrasound using micropuncture set. Ultrasound image was saved for procedural documentation purposes. This allowed for placement of the 10 French TIPS vascular sheath. Subsequently, the right IJ vein was accessed again under ultrasound guidance with a micropuncture set or inferiorly for placement of an 8 French long vascular sheath through which the ICE catheter was advanced into the IVC for intravascular ultrasound. With the aid of angiographic guidewires, an angled angiographic catheter was utilized to select the right middle vein and a hepatic venogram was performed. Position confirmed with IVUS. Sheath advanced into the right hepatic vein. Under IVUS  guidance, the right portal vein was accessed with a Rsch-Uchida TIPS needle. Ultimately, the right portal vein was accessed centrally at a desirable location allowing advancement of a stiff Glidewire into the main portal vein. A 4 French glide catheter was advanced over the stiff Glidewire and a portal venogram was performed. Pressure measurements were obtained: Right atrium 25/18 (21) mmHg, portal vein 45 mmHg mean. Next,  advancement of the measuring  pigtail into the main portal vein. A portal venogram was performed with a measuring Omni Flush catheter. The parenchymal track was dilated with an 8 mm  Mustang Balloon, allowing advancement of the sheath into the main portal vein. Next, a 2 cm (un covered) x 6 cm (covered) x 10 mm diameter GORE VIATORR TIPS Endoprosthesis was advanced through the sheath across the intra hepatic track and Deployed. Follow-up portal venogram demonstrated good stent deployment and flow, no significant residual stenosis. No significant varices or collateral channels were identified. Pressure measurements were obtained: Right atrium 26/19 (22), portal vein 29mHg mean. Discussion with primary service regarding deflation and possible removal of Blakemore tube. During traversal of the right atrium to catheterize the splenic vein via tips, patient developed arrhythmia with hypotension. This responded to intervention. After catheter was advanced into the splenic vein, the gastric catheter was deflated and removed. Follow-up portal venography again showed good stent flow, no significant stenosis, no continued perfusion to the esophageal varices. All wires, catheters and sheaths were removed from the patient. Hemostasis was achieved at the right IJ access sites with manual compression. A dressing was placed. The patient tolerated the procedure well. Patient transferred to the PACU. IMPRESSION: 1. Technically successful TIPS creation with decrease in mean portosystemic pressure from 24 mmHg to 7 mmHg. 2. Technically successful paracentesis under ultrasound guidance. Electronically Signed   By: DLucrezia EuropeM.D.   On: 06/27/2020 15:21   DG Chest Port 1 View  Result Date: 07/19/2020 CLINICAL DATA:  Respiratory failure EXAM: PORTABLE CHEST 1 VIEW COMPARISON:  06/30/2020 FINDINGS: Endotracheal tube tip measures 1.9 cm above the carina. Shallow inspiration. Mild cardiac enlargement. No vascular congestion, edema, or consolidation. No pleural effusions. No pneumothorax. Mediastinal contours appear intact. IMPRESSION: Endotracheal tube tip measures 1.9 cm above the carina. No evidence of active  pulmonary disease. Electronically Signed   By: WLucienne CapersM.D.   On: 07/04/2020 06:38   DG Chest Port 1 View  Result Date: 07/03/2020 CLINICAL DATA:  Post intubation EXAM: PORTABLE CHEST 1 VIEW COMPARISON:  10/18/2019 FINDINGS: An endotracheal tube has been placed with tip measuring 2.8 cm above the carina. Shallow inspiration. Heart size and pulmonary vascularity are normal. Lungs are clear. No pleural effusions. No pneumothorax. Mediastinal contours appear intact. Calcification of the aorta. Degenerative changes in the spine and shoulders. IMPRESSION: Endotracheal tube tip measures 2.8 cm above the carina. No evidence of active pulmonary disease. Electronically Signed   By: WLucienne CapersM.D.   On: 07/09/2020 00:10   [place]IVER DOPPLER  Result Date: 07/01/2020 CLINICAL DATA:  Cirrhosis, bleeding esophageal varices, preop planning TIPS EXAM: DUPLEX ULTRASOUND OF LIVER TECHNIQUE: Color and duplex Doppler ultrasound was performed to evaluate the hepatic in-flow and out-flow vessels. COMPARISON:  03/07/2020 and previous FINDINGS: Liver: Nodular contour. No focal lesion or biliary ductal dilatation. Main Portal Vein size: 0.9 cm Portal Vein Velocities Main Prox:  23 cm/sec Main Mid: 35 cm/sec Main Dist:    34 cm/sec Right: 39 cm/sec Left: 25 cm/sec Hepatic Vein Velocities Right:  25 cm/sec Middle:  44 cm/sec Left:  27 cm/sec IVC: Patent, velocity 46 cm/sec Hepatic Artery Velocity:  180 cm/sec Splenic Vein Velocity:  43 cm/sec Spleen: 8.2 cm x 11.4 cm x 11.4 cm with a total volume of 555 cm^3 (411 cm^3 is upper limit normal) Portal Vein Occlusion/Thrombus: None seen Splenic Vein Occlusion/Thrombus: None seen Ascites: Small volume Varices: None identified IMPRESSION: 1. Patent portal and hepatic veins. 2. Cirrhosis with ascites and  splenomegaly. Electronically Signed   By: Lucrezia Europe M.D.   On: 06/23/2020 08:14   IR Paracentesis  Result Date: 07/09/2020 CLINICAL DATA:  Cirrhosis, portal venous  hypertension, recurrent variceal bleeding despite endoscopic intervention, requiring transfusion and pressor support due to hypotension. EXAM: 1. TIPS CREATION 6 2. ULTRASOUND-GUIDED VENOUS ACCESS 3. PARACENTESIS WITH ULTRASOUND GUIDANCE ANESTHESIA/SEDATION: General - as administered by the Anesthesia department MEDICATIONS: See anesthesia record FLUOROSCOPY TIME:  25 minutes; 628 mGy COMPLICATIONS: Transient arrhythmia and hypotension during right atrial traversal, responded to interventions. SIR level B: Nominal therapy (including overnight admission for observation), no consequence. PROCEDURE: Informed written consent was obtained from the daughter (as the patient was sedated and intubated) after a thorough discussion of the procedural risks, benefits and alternatives. All questions were addressed. See previous consultation. Maximal Sterile Barrier Technique was utilized including caps, mask, sterile gowns, sterile gloves, sterile drape, hand hygiene and skin antiseptic. A timeout was performed prior to the initiation of the procedure. The skin overlying the right upper abdominal quadrant as well as the right neck and femoral region were prepped and draped in usual sterile fashion. Initial ultrasound scanning confirms small volume perihepatic ascites. Operators: Shawnie Dapper Under ultrasound guidance, a 5 French 7 cm Yueh sheath needle was advanced into the peritoneal cavity from a right lateral approach for paracentesis removing yellow ascites. Next, the right internal jugular vein was accessed under direct ultrasound using micropuncture set. Ultrasound image was saved for procedural documentation purposes. This allowed for placement of the 10 French TIPS vascular sheath. Subsequently, the right IJ vein was accessed again under ultrasound guidance with a micropuncture set or inferiorly for placement of an 8 French long vascular sheath through which the ICE catheter was advanced into the IVC for intravascular  ultrasound. With the aid of angiographic guidewires, an angled angiographic catheter was utilized to select the right middle vein and a hepatic venogram was performed. Position confirmed with IVUS. Sheath advanced into the right hepatic vein. Under IVUS  guidance, the right portal vein was accessed with a Rsch-Uchida TIPS needle. Ultimately, the right portal vein was accessed centrally at a desirable location allowing advancement of a stiff Glidewire into the main portal vein. A 4 French glide catheter was advanced over the stiff Glidewire and a portal venogram was performed. Pressure measurements were obtained: Right atrium 25/18 (21) mmHg, portal vein 45 mmHg mean. Next,  advancement of the measuring pigtail into the main portal vein. A portal venogram was performed with a measuring Omni Flush catheter. The parenchymal track was dilated with an 8 mm Mustang Balloon, allowing advancement of the sheath into the main portal vein. Next, a 2 cm (un covered) x 6 cm (covered) x 10 mm diameter GORE VIATORR TIPS Endoprosthesis was advanced through the sheath across the intra hepatic track and Deployed. Follow-up portal venogram demonstrated good stent deployment and flow, no significant residual stenosis. No significant varices or collateral channels were identified. Pressure measurements were obtained: Right atrium 26/19 (22), portal vein 88mHg mean. Discussion with primary service regarding deflation and possible removal of Blakemore tube. During traversal of the right atrium to catheterize the splenic vein via tips, patient developed arrhythmia with hypotension. This responded to intervention. After catheter was advanced into the splenic vein, the gastric catheter was deflated and removed. Follow-up portal venography again showed good stent flow, no significant stenosis, no continued perfusion to the esophageal varices. All wires, catheters and sheaths were removed from the patient. Hemostasis was achieved at the right  IJ  access sites with manual compression. A dressing was placed. The patient tolerated the procedure well. Patient transferred to the PACU. IMPRESSION: 1. Technically successful TIPS creation with decrease in mean portosystemic pressure from 24 mmHg to 7 mmHg. 2. Technically successful paracentesis under ultrasound guidance. Electronically Signed   By: Lucrezia Europe M.D.   On: 07/15/2020 15:21    Labs:  CBC: Recent Labs    07/06/2020 0950 07/09/2020 1001 06/21/2020 1643 06/27/2020 1643 07/17/2020 1654 07/15/20 0224 07/16/20 0258 07/16/20 0839  WBC 31.9*  --  26.3*  --   --  26.3*  --  9.2  HGB 12.7   < > 11.8*   < > 11.6* 11.5* 8.8* 8.8*  HCT 38.7   < > 35.3*   < > 34.0* 33.2* 26.0* 27.2*  PLT 169  --  131*  --   --  PLATELET CLUMPS NOTED ON SMEAR, UNABLE TO ESTIMATE  --  45*   < > = values in this interval not displayed.    COAGS: Recent Labs    10/15/19 0154 06/28/2020 1929 07/12/2020 1741  INR 1.2 1.2 1.5*  APTT  --  34  --     BMP: Recent Labs    10/21/19 0910 10/21/19 0910 10/22/19 0158 10/22/19 0158 10/23/19 0209 10/23/19 0209 10/27/19 1108 06/21/2020 1929 06/28/2020 1643 06/21/2020 1654 07/15/20 0225 07/15/20 1758 07/16/20 0258 07/16/20 0427  NA 133*   < > 134*   < > 135  --  136   < > 136   < > 138 142 149* 146*  K 4.2   < > 4.8   < > 4.6   < > 4.1   < > 4.4   < > 4.3 4.5 4.3 4.0  CL 95*   < > 95*   < > 97*   < > 96   < > 105  --  104 107  --  109  CO2 26   < > 27   < > 27   < > 23   < > 17*  --  20* 18*  --  18*  GLUCOSE 109*   < > 266*   < > 147*  --  235*   < > 333*  --  253* 305*  --  440*  BUN 24*   < > 24*   < > 19  --  15   < > 36*  --  45* 45*  --  47*  CALCIUM 9.2   < > 9.0   < > 9.3   < > 8.9   < > 8.7*  --  8.3* 8.6*  --  8.8*  CREATININE 1.01*   < > 1.15*   < > 1.02*   < > 1.05*   < > 1.37*  --  1.60* 1.56*  --  1.42*  GFRNONAA 51*   < > 44*   < > 51*   < > 49*   < > 38*  --  32* 33*  --  37*  GFRAA 60*  --  51*  --  59*  --  57*  --   --   --   --   --   --    --    < > = values in this interval not displayed.    LIVER FUNCTION TESTS: Recent Labs    10/15/19 0520 10/22/19 0158 10/23/19 0209 06/25/2020 1929 07/12/2020 2256 07/15/20 1009  BILITOT 1.4*  --   --  1.0 1.4* 1.4*  AST 29  --   --  26 23 80*  ALT 20  --   --  19 18 49*  ALKPHOS 154*  --   --  129* 113 88  PROT 6.6  --   --  5.5* 5.0* 4.8*  ALBUMIN 3.2*   < > 3.2* 2.7* 2.4* 2.6*   < > = values in this interval not displayed.    Assessment and Plan: Esophageal varices, hemorrhagic s/p TIPS procedure 11/25 with Dr. Serafina Royals and Dr. Vernard Gambles. Remains intubated. Remains encephalopathic.    HgB 13.7  12.6  11.5  8.8 Now with reddish discoloration in rectal tube which could represent small amounts of blood. Palliative care team involved.    Electronically Signed: Docia Barrier, PA 07/16/2020, 12:12 PM   I spent a total of 15 Minutes at the the patient's bedside AND on the patient's hospital floor or unit, greater than 50% of which was counseling/coordinating care for esophageal varices.

## 2020-07-16 NOTE — Progress Notes (Signed)
Noted increasing temp, ice packs applied.

## 2020-07-17 ENCOUNTER — Inpatient Hospital Stay (HOSPITAL_COMMUNITY): Payer: HMO

## 2020-07-17 ENCOUNTER — Encounter (HOSPITAL_COMMUNITY): Payer: Self-pay | Admitting: Gastroenterology

## 2020-07-17 DIAGNOSIS — Z66 Do not resuscitate: Secondary | ICD-10-CM

## 2020-07-17 DIAGNOSIS — K254 Chronic or unspecified gastric ulcer with hemorrhage: Secondary | ICD-10-CM | POA: Diagnosis not present

## 2020-07-17 DIAGNOSIS — K922 Gastrointestinal hemorrhage, unspecified: Secondary | ICD-10-CM

## 2020-07-17 LAB — TYPE AND SCREEN
ABO/RH(D): O POS
Antibody Screen: NEGATIVE
Unit division: 0
Unit division: 0
Unit division: 0
Unit division: 0

## 2020-07-17 LAB — CBC
HCT: 28.4 % — ABNORMAL LOW (ref 36.0–46.0)
HCT: 28.7 % — ABNORMAL LOW (ref 36.0–46.0)
Hemoglobin: 9.1 g/dL — ABNORMAL LOW (ref 12.0–15.0)
Hemoglobin: 9 g/dL — ABNORMAL LOW (ref 12.0–15.0)
MCH: 30.8 pg (ref 26.0–34.0)
MCH: 30.3 pg (ref 26.0–34.0)
MCHC: 32 g/dL (ref 30.0–36.0)
MCHC: 31.4 g/dL (ref 30.0–36.0)
MCV: 96.3 fL (ref 80.0–100.0)
MCV: 96.6 fL (ref 80.0–100.0)
Platelets: 63 10*3/uL — ABNORMAL LOW (ref 150–400)
Platelets: 66 10*3/uL — ABNORMAL LOW (ref 150–400)
RBC: 2.95 MIL/uL — ABNORMAL LOW (ref 3.87–5.11)
RBC: 2.97 MIL/uL — ABNORMAL LOW (ref 3.87–5.11)
RDW: 17.4 % — ABNORMAL HIGH (ref 11.5–15.5)
RDW: 17.5 % — ABNORMAL HIGH (ref 11.5–15.5)
WBC: 12.5 10*3/uL — ABNORMAL HIGH (ref 4.0–10.5)
WBC: 14.6 10*3/uL — ABNORMAL HIGH (ref 4.0–10.5)
nRBC: 0.2 % (ref 0.0–0.2)
nRBC: 0.5 % — ABNORMAL HIGH (ref 0.0–0.2)

## 2020-07-17 LAB — BASIC METABOLIC PANEL
Anion gap: 13 (ref 5–15)
Anion gap: 10 (ref 5–15)
Anion gap: 8 (ref 5–15)
BUN: 42 mg/dL — ABNORMAL HIGH (ref 8–23)
BUN: 37 mg/dL — ABNORMAL HIGH (ref 8–23)
BUN: 34 mg/dL — ABNORMAL HIGH (ref 8–23)
CO2: 21 mmol/L — ABNORMAL LOW (ref 22–32)
CO2: 22 mmol/L (ref 22–32)
CO2: 20 mmol/L — ABNORMAL LOW (ref 22–32)
Calcium: 8.9 mg/dL (ref 8.9–10.3)
Calcium: 8.8 mg/dL — ABNORMAL LOW (ref 8.9–10.3)
Calcium: 8.8 mg/dL — ABNORMAL LOW (ref 8.9–10.3)
Chloride: 117 mmol/L — ABNORMAL HIGH (ref 98–111)
Chloride: 116 mmol/L — ABNORMAL HIGH (ref 98–111)
Chloride: 121 mmol/L — ABNORMAL HIGH (ref 98–111)
Creatinine, Ser: 1.01 mg/dL — ABNORMAL HIGH (ref 0.44–1.00)
Creatinine, Ser: 0.89 mg/dL (ref 0.44–1.00)
Creatinine, Ser: 0.82 mg/dL (ref 0.44–1.00)
GFR, Estimated: 55 mL/min — ABNORMAL LOW (ref >60)
GFR, Estimated: >60 mL/min (ref >60)
GFR, Estimated: >60 mL/min (ref >60)
Glucose, Bld: 231 mg/dL — ABNORMAL HIGH (ref 70–99)
Glucose, Bld: 285 mg/dL — ABNORMAL HIGH (ref 70–99)
Glucose, Bld: 254 mg/dL — ABNORMAL HIGH (ref 70–99)
Potassium: 3.1 mmol/L — ABNORMAL LOW (ref 3.5–5.1)
Potassium: 3.7 mmol/L (ref 3.5–5.1)
Potassium: 3.5 mmol/L (ref 3.5–5.1)
Sodium: 151 mmol/L — ABNORMAL HIGH (ref 135–145)
Sodium: 148 mmol/L — ABNORMAL HIGH (ref 135–145)
Sodium: 149 mmol/L — ABNORMAL HIGH (ref 135–145)

## 2020-07-17 LAB — GLUCOSE, CAPILLARY
Glucose-Capillary: 182 mg/dL — ABNORMAL HIGH (ref 70–99)
Glucose-Capillary: 184 mg/dL — ABNORMAL HIGH (ref 70–99)
Glucose-Capillary: 185 mg/dL — ABNORMAL HIGH (ref 70–99)
Glucose-Capillary: 197 mg/dL — ABNORMAL HIGH (ref 70–99)
Glucose-Capillary: 203 mg/dL — ABNORMAL HIGH (ref 70–99)
Glucose-Capillary: 205 mg/dL — ABNORMAL HIGH (ref 70–99)
Glucose-Capillary: 205 mg/dL — ABNORMAL HIGH (ref 70–99)
Glucose-Capillary: 206 mg/dL — ABNORMAL HIGH (ref 70–99)
Glucose-Capillary: 208 mg/dL — ABNORMAL HIGH (ref 70–99)
Glucose-Capillary: 210 mg/dL — ABNORMAL HIGH (ref 70–99)
Glucose-Capillary: 219 mg/dL — ABNORMAL HIGH (ref 70–99)
Glucose-Capillary: 220 mg/dL — ABNORMAL HIGH (ref 70–99)
Glucose-Capillary: 228 mg/dL — ABNORMAL HIGH (ref 70–99)
Glucose-Capillary: 238 mg/dL — ABNORMAL HIGH (ref 70–99)
Glucose-Capillary: 246 mg/dL — ABNORMAL HIGH (ref 70–99)
Glucose-Capillary: 247 mg/dL — ABNORMAL HIGH (ref 70–99)
Glucose-Capillary: 267 mg/dL — ABNORMAL HIGH (ref 70–99)
Glucose-Capillary: 269 mg/dL — ABNORMAL HIGH (ref 70–99)

## 2020-07-17 LAB — BETA-HYDROXYBUTYRIC ACID
Beta-Hydroxybutyric Acid: 0.14 mmol/L (ref 0.05–0.27)
Beta-Hydroxybutyric Acid: 0.14 mmol/L (ref 0.05–0.27)
Beta-Hydroxybutyric Acid: 0.12 mmol/L (ref 0.05–0.27)

## 2020-07-17 LAB — BPAM RBC
Blood Product Expiration Date: 202111252359
Blood Product Expiration Date: 202112252359
Blood Product Expiration Date: 202112252359
Blood Product Expiration Date: 202112262359
ISSUE DATE / TIME: 202111242354
ISSUE DATE / TIME: 202111242354
ISSUE DATE / TIME: 202111251016
ISSUE DATE / TIME: 202111251016
Unit Type and Rh: 5100
Unit Type and Rh: 5100
Unit Type and Rh: 5100
Unit Type and Rh: 9500

## 2020-07-17 LAB — AMMONIA: Ammonia: 66 umol/L — ABNORMAL HIGH (ref 9–35)

## 2020-07-17 MED ORDER — INSULIN DETEMIR 100 UNIT/ML ~~LOC~~ SOLN
38.0000 [IU] | Freq: Two times a day (BID) | SUBCUTANEOUS | Status: DC
  Administered 2020-07-17: 38 [IU] via SUBCUTANEOUS
  Filled 2020-07-17 (×2): qty 0.38

## 2020-07-17 MED ORDER — ACETAMINOPHEN 325 MG PO TABS
650.0000 mg | ORAL_TABLET | Freq: Four times a day (QID) | ORAL | Status: DC | PRN
  Administered 2020-07-17: 650 mg via ORAL
  Filled 2020-07-17: qty 2

## 2020-07-17 MED ORDER — POTASSIUM CHLORIDE 10 MEQ/50ML IV SOLN
10.0000 meq | INTRAVENOUS | Status: AC
  Administered 2020-07-17 (×4): 10 meq via INTRAVENOUS
  Filled 2020-07-17 (×3): qty 50

## 2020-07-17 MED ORDER — AMIODARONE LOAD VIA INFUSION
150.0000 mg | Freq: Once | INTRAVENOUS | Status: AC
  Filled 2020-07-17: qty 83.34

## 2020-07-17 MED ORDER — INSULIN ASPART 100 UNIT/ML ~~LOC~~ SOLN
13.0000 [IU] | SUBCUTANEOUS | Status: DC
  Administered 2020-07-17 (×3): 13 [IU] via SUBCUTANEOUS

## 2020-07-17 MED ORDER — INSULIN ASPART 100 UNIT/ML ~~LOC~~ SOLN
2.0000 [IU] | SUBCUTANEOUS | Status: DC
  Administered 2020-07-17 (×2): 6 [IU] via SUBCUTANEOUS

## 2020-07-17 MED ORDER — AMIODARONE HCL IN DEXTROSE 360-4.14 MG/200ML-% IV SOLN
60.0000 mg/h | INTRAVENOUS | Status: AC
  Administered 2020-07-17 (×2): 60 mg/h via INTRAVENOUS

## 2020-07-17 MED ORDER — DEXTROSE 5 % IV SOLN: INTRAVENOUS | Status: DC

## 2020-07-17 MED ORDER — FREE WATER
200.0000 mL | Status: DC
  Administered 2020-07-17 – 2020-07-20 (×38): 200 mL

## 2020-07-17 MED ORDER — AMIODARONE HCL IN DEXTROSE 360-4.14 MG/200ML-% IV SOLN
30.0000 mg/h | INTRAVENOUS | Status: DC
  Administered 2020-07-18 – 2020-07-20 (×5): 30 mg/h via INTRAVENOUS
  Filled 2020-07-17 (×5): qty 200

## 2020-07-17 MED ORDER — ACETAMINOPHEN 160 MG/5ML PO SOLN
650.0000 mg | Freq: Four times a day (QID) | ORAL | Status: DC | PRN
  Administered 2020-07-19: 650 mg
  Filled 2020-07-17: qty 20.3

## 2020-07-17 MED ORDER — AMIODARONE HCL IN DEXTROSE 360-4.14 MG/200ML-% IV SOLN
INTRAVENOUS | Status: AC
  Administered 2020-07-17: 150 mg via INTRAVENOUS
  Filled 2020-07-17: qty 200

## 2020-07-17 NOTE — Progress Notes (Signed)
Daily Progress Note   Patient Name: [name]       Date: 07/17/2020 DOB: 07/17/1934  Age: 86 y.o. MRN#: [id number] Attending Physician: Candee Furbish, MD Primary Care Physician: System, Provider Not In Admit Date: 07/11/2020  Reason for Consultation/Follow-up: To discuss complex medical decision making related to patient's goals of care  Ammonia level 66 today.  Per CCM MD notes will trial easing off on sedation to evaluate mental status.   Subjective: On 11/27 Dtr completed a MOST form checking comfort measures  - However this is only to be put in place if patient is not going to improve. Spoke with [name] briefly this morning.  Her brother Darnelle Maffucci is going to visit today.  Made her aware that Dr. Tamala Julian will be decreasing sedation.  Assessment: 86 yo with NASH cirrhosis.  Severe UGIB, failed banding, s/p emergent TIPs.  Intubated, sedated.  Waiting to be able to evaluate mental status   Patient Profile/HPI:  86 y.o. female  with past medical history of NASH cirrhosis with esophageal varices, COPD, CAD, blindness, DM who was admitted on 07/09/2020 with syncope and hematemesis.  Shortly after admission the patient developed further hematemesis and required intubation.  EGD with banding was done but did not stop the bleeding.  Emergent TIPS procedure was performed.  Patient suffered hemorrhagic shock and went to the ICU intubated, on pressors, and sedated.  Ammonia level was 382.  WBC 26.3.  Length of Stay: 4   Vital Signs: BP 134/85   Pulse 90   Temp 98.4 F (36.9 C) (Axillary)   Resp (!) 35   Ht 5' (1.524 m)   Wt 74.6 kg   SpO2 94%   BMI 32.12 kg/m  SpO2: SpO2: 94 % O2 Device: O2 Device: Ventilator O2 Flow Rate:         Palliative Assessment/Data:  10%     Palliative Care Plan    Recommendations/Plan:  Continue current care plan.  Family hopeful for improvement.  PMT will continue to follow with you.  If patient remains severely encephalopathic family will shift to comfort.  Given liver function it would not be surprising for it to take a few days to allow sedation to metabolize out of her system.  She is DNR with full scope treatment.  Family does not want her  coded if she arrests but does want to give her every opportunity to recover.  Code Status:  DNR  Prognosis:   Unable to determine.   She is at high risk for acute decline and rehospitalization given severe NASH and TIPs procedure.  Discharge Planning:  To Be Determined  Care plan was discussed with [name]  Thank you for allowing the Palliative Medicine Team to assist in the care of this patient.  Total time spent:  15 min.     Greater than 50%  of this time was spent counseling and coordinating care related to the above assessment and plan.  Florentina Jenny, PA-C Palliative Medicine  Please contact Palliative MedicineTeam phone at [phone] for questions and concerns between 7 am - 7 pm.   Please see AMION for individual provider pager numbers.

## 2020-07-17 NOTE — Progress Notes (Signed)
NAME:  [name], MRN:  [id number], DOB:  Jan 16, 1934, LOS: 4 ADMISSION DATE:  07/09/2020, CONSULTATION DATE:  07/17/20 REFERRING MD: EDP  , CHIEF COMPLAINT:  GIB   Brief History   86 year old female with a past medical history of [name] cirrhosis and esophageal varices COPD, CAD diabetes hyperlipidemia presented with syncope and hematemesis.  She became increasingly hypotensive with repeated hematemesis, so was intubated and plan for emergent EGD and transfusion.  History of present illness   [name] is an 86 year old female with a past medical history of [name] cirrhosis and esophageal varices COPD, CAD,  Diabetes,  hyperlipidemia who was in her usual state of health until she went to the bathroom and became lightheaded and syncopized.  When she woke up she began vomiting bright red blood.  EMS found patient hypotensive and weak.  She states she has recently had dark stools, but takes iron so this is normal for her.  Patient's last EGD was in 2016 and was diagnosed with grade 2 esophageal varices and portal hypertensive gastropathy.  She is not on aspirin, blood thinners or PPIs.  He is not sure if she is taking any OTC NSAIDs.   In the ED, patient's blood pressure initially improved with 500 cc bolus.  Hemoglobin was 11.4, this down trended to 9.7.   She also had several episodes of large-volume hematemesis and worsening hypotension so was intubated and started on Levophed.  PCCM consulted for admission and gastroenterology present to perform emergent EGD.   Past Medical History   has a past medical history of Cirrhosis, non-alcoholic ([place]), COPD (chronic obstructive pulmonary disease) ([place]), Coronary artery disease, Diabetes mellitus, Dyslipidemia, Fall, Hypotension (08/02/2010), Hypothyroidism, Leg edema, Legally blind, Myocardial infarct, old, Neuropathy of hand, Obesity, and Sleep apnea.   Significant Hospital Events   11/25 Admit to PCCM  Consults:  Gastroenterology  Procedures:    11/25 CVC by EDP 11/25 ETT, EGD/banding 11/25 [place] (since removed), emergent TIPS  Significant Diagnostic Tests:  11/24 CXR>> lungs clear and ETT in good position  Micro Data:  11/24 Covid-19 and flu>> negative  Antimicrobials:  Ceftriaxone 11/25-   Interim history/subjective:  No events. Remains on sedation.  Objective   Blood pressure (!) 113/44, pulse 78, temperature 97.9 F (36.6 C), temperature source Axillary, resp. rate (!) 30, height 5' (1.524 m), weight 74.6 kg, SpO2 96 %.    Vent Mode: PRVC FiO2 (%):  [30 %] 30 % Set Rate:  [26 bmp] 26 bmp Vt Set:  [360 mL] 360 mL PEEP:  [5 cmH20] 5 cmH20 Plateau Pressure:  [14 cmH20-16 cmH20] 14 cmH20   Intake/Output Summary (Last 24 hours) at 07/17/2020 0720 Last data filed at 07/17/2020 3086 Gross per 24 hour  Intake 3810.08 ml  Output 1200 ml  Net 2610.08 ml   Filed Weights   07/15/20 0500 07/16/20 0430 07/17/20 0500  Weight: 72.3 kg 74.6 kg 74.6 kg   Constitutional: ill appearing woman on vent  Eyes: pupils pinpoint, reactive light Ears, nose, mouth, and throat: ETT in place, minimal secretions Cardiovascular: RRR, ext warm Respiratory: Clear, no wheezing, triggering vent Gastrointestinal: Soft, +BS Skin: No rashes, normal turgor Neurologic: GCS3 Psychiatric: RASS -5   Na up Cr stable Sugars improved with insulin gtt   Resolved Hospital Problem list     Assessment & Plan:  Hemorrhagic Shock secondary to variceal bleed, NASH cirrhosis- s/p emergent EGD and TIPS.  H/H stable, having melena not unexpected  - Monitor H/H, usual transfusion threshold -  PPI  Severe hepatic encephalopathy- ammonia 380 after TIPS - Continue lactulose, rifaximin - Recheck ammonia - Lighten sedation today to see where we stand  Acute hypoxemic respiratory failure- on vent - f/u CXR - VAP prevention bundle - Mental status precludes extubation  Acute kidney injury, hypernatremia- improving - Switch D5LR to D5W  DM2  with hyperglycemia- had to be put back on insulin gtt 11/27, will need a very large basal bolus regimen if we are to get her off the drip, will discuss with pharmD  GOC- discussed daily with family daily, lighten sedation today, check ammonia and see where we are.  Grim prognosis  Best practice (evaluated daily)   Diet: increase to goal Pain/Anxiety/Delirium protocol (if indicated): wean VAP protocol (if indicated): in place DVT prophylaxis: SCD's GI prophylaxis: protonix Glucose control: SSI Mobility: bed rest Last date of multidisciplinary goals of care discussion: Son updated at the bedside 11/27, wants full code and aggressive care Family and staff present: RN, palliative Summary of discussion: aggressive care, if does not wake up, switch to comfort Follow up goals of care discussion due 12/4 Code Status: Full code Disposition: ICU   Patient critically ill due to respiratory failure, encephalopathy Interventions to address this today lactulose, rifaxamin, sedation weaning, vent weaning Risk of deterioration without these interventions is high  I personally spent 35 minutes providing critical care not including any separately billable procedures  Erskine Emery MD [facility] 07/17/2020 7:20 AM Personal pager: [phone] If unanswered, please page CCM On-call: [phone]

## 2020-07-17 NOTE — Progress Notes (Signed)
[place] Progress Note Patient Name: [name] DOB: [birth year] MRN: [id number]   Date of Service  07/17/2020  HPI/Events of Note  Na increased to 151. K 3.1.   eICU Interventions  Double free water to 253m Q2H per tube.  Give 40 mEq KCl IV.     Intervention Category Major Interventions: Electrolyte abnormality - evaluation and management  RMarily LenteStretch 07/17/2020, 3:41 AM

## 2020-07-17 NOTE — Progress Notes (Signed)
[place] Progress Note Patient Name: Yoko Mcgahee DOB: Dec 24, 1933 MRN: [id number]   Date of Service  07/17/2020  HPI/Events of Note  CBG 200-210 on insulin drip + D5 LR @ 125cc/hr.  eICU Interventions  Decrease D5 LR to 100cc/hr. Continue on insulin drip.     Intervention Category Major Interventions: Hyperglycemia - active titration of insulin therapy  Charlott Rakes 07/17/2020, 5:15 AM

## 2020-07-17 NOTE — Progress Notes (Signed)
  Amiodarone Drug - Drug Interaction Consult Note  Recommendations: Patient is on both Amiodarone and Lexapro - risk of QT prolongation. Last EKG on 11/26 with QTc 442. Continue to monitor.   Amiodarone is metabolized by the cytochrome P450 system and therefore has the potential to cause many drug interactions. Amiodarone has an average plasma half-life of 50 days (range 20 to 100 days).   There is potential for drug interactions to occur several weeks or months after stopping treatment and the onset of drug interactions may be slow after initiating amiodarone.   []  Statins: Increased risk of myopathy. Simvastatin- restrict dose to 23m daily. Other statins: counsel patients to report any muscle pain or weakness immediately.  []  Anticoagulants: Amiodarone can increase anticoagulant effect. Consider warfarin dose reduction. Patients should be monitored closely and the dose of anticoagulant altered accordingly, remembering that amiodarone levels take several weeks to stabilize.  []  Antiepileptics: Amiodarone can increase plasma concentration of phenytoin, the dose should be reduced. Note that small changes in phenytoin dose can result in large changes in levels. Monitor patient and counsel on signs of toxicity.  []  Beta blockers: increased risk of bradycardia, AV block and myocardial depression. Sotalol - avoid concomitant use.  []   Calcium channel blockers (diltiazem and verapamil): increased risk of bradycardia, AV block and myocardial depression.  []   Cyclosporine: Amiodarone increases levels of cyclosporine. Reduced dose of cyclosporine is recommended.  []  Digoxin dose should be halved when amiodarone is started.  []  Diuretics: increased risk of cardiotoxicity if hypokalemia occurs.  []  Oral hypoglycemic agents (glyburide, glipizide, glimepiride): increased risk of hypoglycemia. Patient's glucose levels should be monitored closely when initiating amiodarone therapy.   [x]  Drugs that  prolong the QT interval:  Torsades de pointes risk may be increased with concurrent use - avoid if possible.  Monitor QTc, also keep magnesium/potassium WNL if concurrent therapy can't be avoided. .Marland KitchenAntibiotics: e.g. fluoroquinolones, erythromycin. . Antiarrhythmics: e.g. quinidine, procainamide, disopyramide, sotalol. . Antipsychotics: e.g. phenothiazines, haloperidol.  . Lithium, tricyclic antidepressants, and methadone. Thank You,  MBrain Hilts 07/17/2020 2:36 PM

## 2020-07-18 ENCOUNTER — Other Ambulatory Visit: Payer: Self-pay

## 2020-07-18 DIAGNOSIS — I8511 Secondary esophageal varices with bleeding: Secondary | ICD-10-CM

## 2020-07-18 LAB — BASIC METABOLIC PANEL
Anion gap: 11 (ref 5–15)
BUN: 34 mg/dL — ABNORMAL HIGH (ref 8–23)
CO2: 19 mmol/L — ABNORMAL LOW (ref 22–32)
Calcium: 8.2 mg/dL — ABNORMAL LOW (ref 8.9–10.3)
Chloride: 109 mmol/L (ref 98–111)
Creatinine, Ser: 0.86 mg/dL (ref 0.44–1.00)
GFR, Estimated: >60 mL/min (ref >60)
Glucose, Bld: 384 mg/dL — ABNORMAL HIGH (ref 70–99)
Potassium: 3.8 mmol/L (ref 3.5–5.1)
Sodium: 139 mmol/L (ref 135–145)

## 2020-07-18 LAB — GLUCOSE, CAPILLARY
Glucose-Capillary: 308 mg/dL — ABNORMAL HIGH (ref 70–99)
Glucose-Capillary: 319 mg/dL — ABNORMAL HIGH (ref 70–99)
Glucose-Capillary: 299 mg/dL — ABNORMAL HIGH (ref 70–99)
Glucose-Capillary: 262 mg/dL — ABNORMAL HIGH (ref 70–99)
Glucose-Capillary: 231 mg/dL — ABNORMAL HIGH (ref 70–99)
Glucose-Capillary: 190 mg/dL — ABNORMAL HIGH (ref 70–99)
Glucose-Capillary: 190 mg/dL — ABNORMAL HIGH (ref 70–99)
Glucose-Capillary: 192 mg/dL — ABNORMAL HIGH (ref 70–99)
Glucose-Capillary: 166 mg/dL — ABNORMAL HIGH (ref 70–99)
Glucose-Capillary: 162 mg/dL — ABNORMAL HIGH (ref 70–99)
Glucose-Capillary: 167 mg/dL — ABNORMAL HIGH (ref 70–99)
Glucose-Capillary: 208 mg/dL — ABNORMAL HIGH (ref 70–99)
Glucose-Capillary: 292 mg/dL — ABNORMAL HIGH (ref 70–99)
Glucose-Capillary: 307 mg/dL — ABNORMAL HIGH (ref 70–99)

## 2020-07-18 LAB — BETA-HYDROXYBUTYRIC ACID: Beta-Hydroxybutyric Acid: 0.12 mmol/L (ref 0.05–0.27)

## 2020-07-18 MED ORDER — INSULIN ASPART 100 UNIT/ML ~~LOC~~ SOLN: 23.0000 [IU] | Freq: Once | SUBCUTANEOUS | Status: DC

## 2020-07-18 MED ORDER — INSULIN REGULAR(HUMAN) IN NACL 100-0.9 UT/100ML-% IV SOLN
INTRAVENOUS | Status: DC
  Administered 2020-07-18: 4 [IU]/h via INTRAVENOUS
  Administered 2020-07-18: 13 [IU]/h via INTRAVENOUS
  Filled 2020-07-18 (×2): qty 100

## 2020-07-18 MED ORDER — INSULIN DETEMIR 100 UNIT/ML ~~LOC~~ SOLN
40.0000 [IU] | Freq: Two times a day (BID) | SUBCUTANEOUS | Status: DC
  Administered 2020-07-18 (×2): 40 [IU] via SUBCUTANEOUS
  Filled 2020-07-18 (×4): qty 0.4

## 2020-07-18 MED ORDER — INSULIN ASPART 100 UNIT/ML ~~LOC~~ SOLN
0.0000 [IU] | SUBCUTANEOUS | Status: DC
  Administered 2020-07-18: 7 [IU] via SUBCUTANEOUS
  Administered 2020-07-18: 11 [IU] via SUBCUTANEOUS
  Administered 2020-07-19: 7 [IU] via SUBCUTANEOUS
  Administered 2020-07-19: 15 [IU] via SUBCUTANEOUS
  Administered 2020-07-19: 7 [IU] via SUBCUTANEOUS
  Administered 2020-07-19: 11 [IU] via SUBCUTANEOUS
  Administered 2020-07-19: 4 [IU] via SUBCUTANEOUS
  Administered 2020-07-19 (×2): 11 [IU] via SUBCUTANEOUS
  Administered 2020-07-20: 7 [IU] via SUBCUTANEOUS

## 2020-07-18 MED ORDER — DEXTROSE 50 % IV SOLN: 0.0000 mL | INTRAVENOUS | Status: DC | PRN

## 2020-07-18 MED ORDER — INSULIN ASPART 100 UNIT/ML ~~LOC~~ SOLN
10.0000 [IU] | Freq: Once | SUBCUTANEOUS | Status: AC
  Administered 2020-07-18: 10 [IU] via SUBCUTANEOUS

## 2020-07-18 NOTE — Patient Outreach (Signed)
  [facility]) Care Management Chronic Special Needs Program    07/18/2020  Name: [name], DOB: 08-Dec-1933  MRN: [id number]   [name] is enrolled in a chronic special needs plan for Diabetes. Notification received that client was admitted to  [facility] on 07/02/2020 with Loss of consciousness and  Hematemesis with nausea .    PLAN; RNCM will send individualized care plan to [facility] hospital utilization management department.   Quinn Plowman RN,BSN,CCM [facility] Care Management [phone]

## 2020-07-18 NOTE — Progress Notes (Signed)
[place] Progress Note Patient Name: [name] DOB: 1934/01/07 MRN: [id number]   Date of Service  07/18/2020  HPI/Events of Note  CBG 267. Patient was transitioned from insulin drip to [place] insulin earlier today but timing of the first dose of Lantus was delayed until this evening. Trend of glycemic control is worsening.   eICU Interventions  Will give Aspart 23u [place] now (13u tube feed dose + 10u correctional).  If next CBG does not show an improving trend, would consider putting her back on insulin drip.     Intervention Category Intermediate Interventions: Hyperglycemia - evaluation and treatment  Charlott Rakes 07/18/2020, 12:04 AM

## 2020-07-18 NOTE — Progress Notes (Addendum)
[facility] Gastroenterology Progress Note  [name] 86 y.o. [birth year]  CC:  Variceal bleed s/p TIPS  Subjective: Patient remains intubated, sedation being weaned but patient unresponsive and unable to provide any subjective data.  ROS : Review of Systems  Unable to perform ROS: Intubated   Objective: Vital signs in last 24 hours: Vitals:   07/18/20 0726 07/18/20 0748  BP:    Pulse:    Resp:  (!) 26  Temp: 99.3 F (37.4 C)   SpO2:  95%    Physical Exam:  General:  Intubated and unresponsive  Head:  Normocephalic, without obvious abnormality, atraumatic  Eyes:  Anicteric sclera, EOMs intact  Lungs:   Mechanically ventilated, tacypneic  Heart:  Mildly tachycardic with regular rhythm  Abdomen:   Soft, non-tender (no grimace), non-distended, bowel sounds active all four quadrants, no guarding or peritoneal signs; rectal tube with brown stool  Extremities: SCDs in place  Pulses: 2+ and symmetric    Lab Results: Recent Labs    07/17/20 1251 07/18/20 0448  NA 149* 139  K 3.5 3.8  CL 121* 109  CO2 20* 19*  GLUCOSE 254* 384*  BUN 34* 34*  CREATININE 0.82 0.86  CALCIUM 8.8* 8.2*   Recent Labs    07/16/20 0427  AST 109*  ALT 78*  ALKPHOS 86  BILITOT 2.0*  PROT 5.5*  ALBUMIN 3.7   Recent Labs    07/17/20 0659 07/17/20 1903  WBC 12.5* 14.6*  HGB 9.1* 9.0*  HCT 28.4* 28.7*  MCV 96.3 96.6  PLT 63* 66*   No results for input(s): LABPROT, INR in the last 72 hours.    Assessment: Variceal bleeding s/p EGD, followed by TIPS.  Patient having brown stool output. -Hemoglobin 9.0 yesterday, stable -Currently intubated and sedated  Cirrhosis, suspected NAFLD. Hepatic encephalopathy.  Plan: Continue supportive care.    Continue Protonix 40 mg IV twice daily.  Continue Lactulose, titrated for 2-3 soft stools/day.  Continue rifaximin.  Continue to monitor H&H with transfusion as needed to maintain hemoglobin greater than 7.  Patient should follow up  with Dr. Therisa Doyne after discharge, our office will call to arrange, though contact information also listed in AVS if patient needs to reach [place] sooner.  [facility] will sign off. Please contact us if we can be of any further assistance during this hospital stay.  Salley Slaughter PA-C 07/18/2020, 11:28 AM  Contact #  [phone]

## 2020-07-18 NOTE — Progress Notes (Signed)
[place] Progress Note Patient Name: Tateanna Bach DOB: 20-Sep-1933 MRN: [id number]   Date of Service  07/18/2020  HPI/Events of Note  CBG 308. Continues to increase. Due to her hypernatremia she is on both 200cc Q2H free water flushes and 125cc/hr D5W. This is obviously contributing to the difficulty in controlling her hyperglycemia, in addition to her considerable insulin resistance.   eICU Interventions  D/c ISS + levemir. Start insulin drip.     Intervention Category Major Interventions: Hyperglycemia - active titration of insulin therapy  Charlott Rakes 07/18/2020, 3:48 AM

## 2020-07-18 NOTE — Progress Notes (Signed)
NAME:  [name], MRN:  [id number], DOB:  [birth year], LOS: 5 ADMISSION DATE:  06/26/2020, CONSULTATION DATE:  07/18/20 REFERRING MD: EDP  , CHIEF COMPLAINT:  GIB   Brief History   86 year old female with a past medical history of [name] cirrhosis and esophageal varices COPD, CAD diabetes hyperlipidemia presented with syncope and hematemesis.  She became increasingly hypotensive with repeated hematemesis, so was intubated and plan for emergent EGD and transfusion.  History of present illness   Ms. Gilchrest is an 86 year old female with a past medical history of [name] cirrhosis and esophageal varices COPD, CAD,  Diabetes,  hyperlipidemia who was in her usual state of health until she went to the bathroom and became lightheaded and syncopized.  When she woke up she began vomiting bright red blood.  EMS found patient hypotensive and weak.  She states she has recently had dark stools, but takes iron so this is normal for her.  Patient's last EGD was in 2016 and was diagnosed with grade 2 esophageal varices and portal hypertensive gastropathy.  She is not on aspirin, blood thinners or PPIs.  He is not sure if she is taking any OTC NSAIDs.   In the ED, patient's blood pressure initially improved with 500 cc bolus.  Hemoglobin was 11.4, this down trended to 9.7.   She also had several episodes of large-volume hematemesis and worsening hypotension so was intubated and started on Levophed.  PCCM consulted for admission and gastroenterology present to perform emergent EGD.   Past Medical History   has a past medical history of Cirrhosis, non-alcoholic ([place]), COPD (chronic obstructive pulmonary disease) ([place]), Coronary artery disease, Diabetes mellitus, Dyslipidemia, Fall, Hypotension (08/02/2010), Hypothyroidism, Leg edema, Legally blind, Myocardial infarct, old, Neuropathy of hand, Obesity, and Sleep apnea.   Significant Hospital Events   11/25 Admit to PCCM  Consults:  Gastroenterology  Procedures:    11/25 CVC by EDP 11/25 ETT, EGD/banding 11/25 [place] (since [place]), emergent TIPS  Significant Diagnostic Tests:  11/24 CXR>> lungs clear and ETT in good position  Micro Data:  11/24 Covid-19 and flu>> negative  Antimicrobials:  Ceftriaxone 11/25-   Interim history/subjective:  Blood sugars difficult to control so re-started insulin drip overnight after trying to wean off.  Ammonia level 66 yesterday down from 382.   No acute events overnight per nursing.  Objective   Blood pressure (!) 105/53, pulse (!) 123, temperature 99.3 F (37.4 C), temperature source Oral, resp. rate (!) 26, height 5' (1.524 m), weight 77.5 kg, SpO2 95 %.    Vent Mode: PRVC FiO2 (%):  [30 %] 30 % Set Rate:  [26 bmp] 26 bmp Vt Set:  [360 mL] 360 mL PEEP:  [5 cmH20] 5 cmH20 Plateau Pressure:  [14 cmH20] 14 cmH20   Intake/Output Summary (Last 24 hours) at 07/18/2020 0754 Last data filed at 07/18/2020 0600 Gross per 24 hour  Intake 4355.81 ml  Output 3005 ml  Net 1350.81 ml   Filed Weights   07/16/20 0430 07/17/20 0500 07/18/20 0500  Weight: 74.6 kg 74.6 kg 77.5 kg   Constitutional: ill appearing woman on vent  Eyes: pupils pinpoint, reactive light Ears, nose, mouth, and throat: ETT in place, minimal secretions Cardiovascular: RRR, ext warm Respiratory: Clear, no wheezing, triggering vent Gastrointestinal: Soft, +BS Skin: No rashes, normal turgor Neurologic: GCS3 Psychiatric: RASS -5  Resolved Hospital Problem list     Assessment & Plan:  Hemorrhagic Shock secondary to variceal bleed, NASH cirrhosis- s/p emergent EGD and TIPS.  H/H stable, having melena not unexpected  - Monitor H/H, usual transfusion threshold - PPI  Severe hepatic encephalopathy - Continue lactulose, rifaximin - Ammonia down trending 382 to 66 on 11/28 - Stop sedation today  Acute hypoxemic respiratory failure- on vent - f/u CXR - VAP prevention bundle - Mental status precludes extubation  Acute kidney  injury, hypernatremia- improved - D5W discontinued today  DM2 with hyperglycemia- had to be put back on insulin gtt 11/27 - Transition off insulin drip today - Give 40units levemir BID - Aggressive sliding scale insulin  Best practice (evaluated daily)   Diet: increase to goal Pain/Anxiety/Delirium protocol (if indicated): wean VAP protocol (if indicated): in place DVT prophylaxis: SCD's GI prophylaxis: protonix Glucose control: SSI Mobility: bed rest Last date of multidisciplinary goals of care discussion: Son updated at the bedside 11/27, wants full code and aggressive care Family and staff present: RN, palliative Summary of discussion: aggressive care, if does not wake up, switch to comfort Follow up goals of care discussion due 12/4 Code Status: Full code Disposition: ICU   I personally spent 45 minutes providing critical care not including any separately billable procedures  Freda Jackson, MD [name] Office: [phone]   See Amion for Pager Details

## 2020-07-18 NOTE — Progress Notes (Signed)
Supervising Physician: Dr. Annamaria Boots  Patient Status:  [facility] - In-pt  Chief Complaint: GI bleed Esophageal varices NASH cirrhosis  Subjective: Patient intubated.   Patient does not arouse, encephalopathic On pressors.  Hgb stable, though no labs today.  Allergies: Ace inhibitors, Benadryl [diphenhydramine hcl (sleep)], Fexofenadine hcl, Penicillins, Sulfonamide derivatives, and Sulfa antibiotics  Medications:  Current Facility-Administered Medications:  .  0.9 %  sodium chloride infusion, , Intravenous, Continuous, Gleason, Otilio Carpen, PA-C .  acetaminophen (TYLENOL) 160 MG/5ML solution 650 mg, 650 mg, Per Tube, Q6H PRN, Candee Furbish, MD .  [EXPIRED] amiodarone (NEXTERONE PREMIX) 360-4.14 MG/200ML-% (1.8 mg/mL) IV infusion, 60 mg/hr, Intravenous, Continuous, Last Rate: 33.3 mL/hr at 07/17/20 2100, 60 mg/hr at 07/17/20 2100 **FOLLOWED BY** amiodarone (NEXTERONE PREMIX) 360-4.14 MG/200ML-% (1.8 mg/mL) IV infusion, 30 mg/hr, Intravenous, Continuous, Candee Furbish, MD, Last Rate: 16.67 mL/hr at 07/18/20 0600, 30 mg/hr at 07/18/20 0600 .  cefTRIAXone (ROCEPHIN) 2 g in sodium chloride 0.9 % 100 mL IVPB, 2 g, Intravenous, Q24H, Freddi Starr, MD, Stopped at 07/17/20 2202 .  chlorhexidine gluconate (MEDLINE KIT) (PERIDEX) 0.12 % solution 15 mL, 15 mL, Mouth Rinse, BID, Halford Chessman, Vineet, MD, 15 mL at 07/18/20 0814 .  Chlorhexidine Gluconate Cloth 2 % PADS 6 each, 6 each, Topical, Daily, Chesley Mires, MD, 6 each at 07/18/20 0951 .  dextrose 50 % solution 0-50 mL, 0-50 mL, Intravenous, PRN, Stretch, Marily Lente, MD .  docusate (COLACE) 50 MG/5ML liquid 100 mg, 100 mg, Per Tube, BID PRN, Candee Furbish, MD .  escitalopram (LEXAPRO) tablet 20 mg, 20 mg, Per Tube, QHS, Candee Furbish, MD, 20 mg at 07/17/20 2120 .  feeding supplement (PROSource TF) liquid 45 mL, 45 mL, Per Tube, QID, Candee Furbish, MD, 45 mL at 07/18/20 0941 .  feeding supplement (VITAL 1.5 CAL) liquid 1,000 mL, 1,000 mL, Per  Tube, Q24H, Candee Furbish, MD, Last Rate: 40 mL/hr at 07/18/20 0952, 1,000 mL at 07/18/20 0952 .  free water 200 mL, 200 mL, Per Tube, Q2H, Stretch, Marily Lente, MD, 200 mL at 07/18/20 1131 .  insulin regular, human (MYXREDLIN) 100 units/ 100 mL infusion, , Intravenous, Continuous, Stretch, Marily Lente, MD, Last Rate: 14 mL/hr at 07/18/20 0600, Rate Verify at 07/18/20 0600 .  iohexol (OMNIPAQUE) 300 MG/ML solution 150 mL, 150 mL, Intravenous, Once PRN, [name], MD .  lactulose (CHRONULAC) 10 GM/15ML solution 30 g, 30 g, Per Tube, TID, Candee Furbish, MD, 30 g at 07/18/20 0940 .  levothyroxine (SYNTHROID) tablet 100 mcg, 100 mcg, Per Tube, Q0600, Candee Furbish, MD, 100 mcg at 07/18/20 0518 .  loratadine (CLARITIN) tablet 10 mg, 10 mg, Per Tube, Daily, Candee Furbish, MD, 10 mg at 07/18/20 0940 .  MEDLINE mouth rinse, 15 mL, Mouth Rinse, 10 times per day, Chesley Mires, MD, 15 mL at 07/18/20 1131 .  montelukast (SINGULAIR) tablet 10 mg, 10 mg, Per Tube, QHS, Candee Furbish, MD, 10 mg at 07/17/20 2120 .  multivitamin with minerals tablet 1 tablet, 1 tablet, Per Tube, Daily, Candee Furbish, MD, 1 tablet at 07/18/20 0940 .  norepinephrine (LEVOPHED) 16 mg in 247m premix infusion, 0-40 mcg/min, Intravenous, Continuous, Sood, Vineet, MD, Last Rate: 5.63 mL/hr at 07/18/20 0600, 6 mcg/min at 07/18/20 0600 .  ondansetron (ZOFRAN) tablet 4 mg, 4 mg, Per Tube, Q6H PRN **OR** ondansetron (ZOFRAN) injection 4 mg, 4 mg, Intravenous, Q6H PRN, SCandee Furbish MD .  pantoprazole (PROTONIX)  injection 40 mg, 40 mg, Intravenous, Q12H, Gleason, Otilio Carpen, PA-C, 40 mg at 07/18/20 0941 .  polyethylene glycol (MIRALAX / GLYCOLAX) packet 17 g, 17 g, Per Tube, Daily PRN, Candee Furbish, MD .  propofol (DIPRIVAN) 1000 MG/100ML infusion, 0-50 mcg/kg/min, Intravenous, Continuous, Kamat, Sunil G, MD, Last Rate: 19.58 mL/hr at 07/18/20 0600, 45 mcg/kg/min at 07/18/20 0600 .  rifaximin (XIFAXAN) tablet 550 mg, 550 mg, Per  Tube, BID, Candee Furbish, MD, 550 mg at 07/18/20 0940 .  sodium chloride flush (NS) 0.9 % injection 10-40 mL, 10-40 mL, Intracatheter, Q12H, Chesley Mires, MD, 10 mL at 07/17/20 2131 .  sodium chloride flush (NS) 0.9 % injection 10-40 mL, 10-40 mL, Intracatheter, PRN, Halford Chessman, Vineet, MD .  sodium chloride flush (NS) 0.9 % injection 3 mL, 3 mL, Intravenous, Q12H, Gleason, Otilio Carpen, PA-C, 3 mL at 07/18/20 0944 .  vasopressin (PITRESSIN) 20 Units in sodium chloride 0.9 % 100 mL infusion-*FOR SHOCK*, 0-0.03 Units/min, Intravenous, Continuous, Candee Furbish, MD, Last Rate: 6 mL/hr at 07/18/20 0600, 0.02 Units/min at 07/18/20 0600    Vital Signs: BP (!) 105/53 (BP Location: Left Arm)   Pulse (!) 123   Temp 99.3 F (37.4 C) (Oral)   Resp (!) 26   Ht 5' (1.524 m)   Wt 77.5 kg   SpO2 95%   BMI 33.37 kg/m   Physical Exam  Intubated, not alert, not interactive, does not arouse with stimulation Neck: procedure site intact, no bleeding Abdomen: soft, no distention.  Procedure site intact, clean, and dry.    Imaging: EEG  Result Date: 07/15/2020 Lora Havens, MD     07/15/2020  9:41 AM Patient Name: [name] MRN: [id number] Epilepsy Attending: Lora Havens Referring Physician/Provider: Dr. Ina Homes Date: 07/15/2020 Duration: 24.37 minutes Patient history: 86 year old female with altered mental status.  EEG to evaluate for seizures. Level of alertness: Comatose AEDs during EEG study: Propofol Technical aspects: This EEG study was done with scalp electrodes positioned according to the 10-20 International system of electrode placement. Electrical activity was acquired at a sampling rate of 500Hz  and reviewed with a high frequency filter of 70Hz  and a low frequency filter of 1Hz . EEG data were recorded continuously and digitally stored. Description: EEG showed continuous generalized background attenuation.  EEG was not reactive to tactile stimulation.  Hyperventilation and photic  stimulation were not performed.   ABNORMALITY -Background attenuation, generalized IMPRESSION: This study is suggestive of profound diffuse encephalopathy, nonspecific etiology. No seizures or epileptiform discharges were seen throughout the recording. Lora Havens   DG Chest 1 View  Result Date: 07/17/2020 CLINICAL DATA:  Hypoxia EXAM: CHEST  1 VIEW COMPARISON:  July 14, 2020 FINDINGS: Endotracheal tube tip is 1.5 cm above the carina. Nasogastric tube tip and side port are below the diaphragm. No pneumothorax. There is a left pleural effusion. No edema or airspace opacity. Heart is upper normal in size with pulmonary vascularity within normal limits. No adenopathy. No bone lesions. IMPRESSION: Tube positions as described without pneumothorax. Note that the endotracheal tube tip is fairly close to the carina. Left pleural effusion. No edema or airspace opacity. Heart upper normal in size. Electronically Signed   By: Lowella Grip III M.D.   On: 07/17/2020 10:46   IR Tips  Result Date: 07/16/2020 CLINICAL DATA:  Cirrhosis, portal venous hypertension, recurrent variceal bleeding despite endoscopic intervention, requiring transfusion and pressor support due to hypotension. EXAM: 1. TIPS CREATION 6 2. ULTRASOUND-GUIDED VENOUS ACCESS  3. PARACENTESIS WITH ULTRASOUND GUIDANCE ANESTHESIA/SEDATION: General - as administered by the Anesthesia department MEDICATIONS: See anesthesia record FLUOROSCOPY TIME:  25 minutes; 563 mGy COMPLICATIONS: Transient arrhythmia and hypotension during right atrial traversal, responded to interventions. SIR level B: Nominal therapy (including overnight admission for observation), no consequence. PROCEDURE: Informed written consent was obtained from the daughter (as the patient was sedated and intubated) after a thorough discussion of the procedural risks, benefits and alternatives. All questions were addressed. See previous consultation. Maximal Sterile Barrier Technique  was utilized including caps, mask, sterile gowns, sterile gloves, sterile drape, hand hygiene and skin antiseptic. A timeout was performed prior to the initiation of the procedure. The skin overlying the right upper abdominal quadrant as well as the right neck and femoral region were prepped and draped in usual sterile fashion. Initial ultrasound scanning confirms small volume perihepatic ascites. Operators: Shawnie Dapper Under ultrasound guidance, a 5 French 7 cm Yueh sheath needle was advanced into the peritoneal cavity from a right lateral approach for paracentesis removing yellow ascites. Next, the right internal jugular vein was accessed under direct ultrasound using micropuncture set. Ultrasound image was saved for procedural documentation purposes. This allowed for placement of the 10 French TIPS vascular sheath. Subsequently, the right IJ vein was accessed again under ultrasound guidance with a micropuncture set or inferiorly for placement of an 8 French long vascular sheath through which the ICE catheter was advanced into the IVC for intravascular ultrasound. With the aid of angiographic guidewires, an angled angiographic catheter was utilized to select the right middle vein and a hepatic venogram was performed. Position confirmed with IVUS. Sheath advanced into the right hepatic vein. Under IVUS  guidance, the right portal vein was accessed with a Rsch-Uchida TIPS needle. Ultimately, the right portal vein was accessed centrally at a desirable location allowing advancement of a stiff Glidewire into the main portal vein. A 4 French glide catheter was advanced over the stiff Glidewire and a portal venogram was performed. Pressure measurements were obtained: Right atrium 25/18 (21) mmHg, portal vein 45 mmHg mean. Next,  advancement of the measuring pigtail into the main portal vein. A portal venogram was performed with a measuring Omni Flush catheter. The parenchymal track was dilated with an 8 mm Mustang  Balloon, allowing advancement of the sheath into the main portal vein. Next, a 2 cm (un covered) x 6 cm (covered) x 10 mm diameter GORE VIATORR TIPS Endoprosthesis was advanced through the sheath across the intra hepatic track and Deployed. Follow-up portal venogram demonstrated good stent deployment and flow, no significant residual stenosis. No significant varices or collateral channels were identified. Pressure measurements were obtained: Right atrium 26/19 (22), portal vein 54mHg mean. Discussion with primary service regarding deflation and possible removal of Blakemore tube. During traversal of the right atrium to catheterize the splenic vein via tips, patient developed arrhythmia with hypotension. This responded to intervention. After catheter was advanced into the splenic vein, the gastric catheter was deflated and removed. Follow-up portal venography again showed good stent flow, no significant stenosis, no continued perfusion to the esophageal varices. All wires, catheters and sheaths were removed from the patient. Hemostasis was achieved at the right IJ access sites with manual compression. A dressing was placed. The patient tolerated the procedure well. Patient transferred to the PACU. IMPRESSION: 1. Technically successful TIPS creation with decrease in mean portosystemic pressure from 24 mmHg to 7 mmHg. 2. Technically successful paracentesis under ultrasound guidance. Electronically Signed   By: DEden EmmsD.  On: 07/06/2020 15:21   IR Paracentesis  Result Date: 07/15/2020 CLINICAL DATA:  Cirrhosis, portal venous hypertension, recurrent variceal bleeding despite endoscopic intervention, requiring transfusion and pressor support due to hypotension. EXAM: 1. TIPS CREATION 6 2. ULTRASOUND-GUIDED VENOUS ACCESS 3. PARACENTESIS WITH ULTRASOUND GUIDANCE ANESTHESIA/SEDATION: General - as administered by the Anesthesia department MEDICATIONS: See anesthesia record FLUOROSCOPY TIME:  25 minutes; 637 mGy  COMPLICATIONS: Transient arrhythmia and hypotension during right atrial traversal, responded to interventions. SIR level B: Nominal therapy (including overnight admission for observation), no consequence. PROCEDURE: Informed written consent was obtained from the daughter (as the patient was sedated and intubated) after a thorough discussion of the procedural risks, benefits and alternatives. All questions were addressed. See previous consultation. Maximal Sterile Barrier Technique was utilized including caps, mask, sterile gowns, sterile gloves, sterile drape, hand hygiene and skin antiseptic. A timeout was performed prior to the initiation of the procedure. The skin overlying the right upper abdominal quadrant as well as the right neck and femoral region were prepped and draped in usual sterile fashion. Initial ultrasound scanning confirms small volume perihepatic ascites. Operators: Shawnie Dapper Under ultrasound guidance, a 5 French 7 cm Yueh sheath needle was advanced into the peritoneal cavity from a right lateral approach for paracentesis removing yellow ascites. Next, the right internal jugular vein was accessed under direct ultrasound using micropuncture set. Ultrasound image was saved for procedural documentation purposes. This allowed for placement of the 10 French TIPS vascular sheath. Subsequently, the right IJ vein was accessed again under ultrasound guidance with a micropuncture set or inferiorly for placement of an 8 French long vascular sheath through which the ICE catheter was advanced into the IVC for intravascular ultrasound. With the aid of angiographic guidewires, an angled angiographic catheter was utilized to select the right middle vein and a hepatic venogram was performed. Position confirmed with IVUS. Sheath advanced into the right hepatic vein. Under IVUS  guidance, the right portal vein was accessed with a Rsch-Uchida TIPS needle. Ultimately, the right portal vein was accessed centrally  at a desirable location allowing advancement of a stiff Glidewire into the main portal vein. A 4 French glide catheter was advanced over the stiff Glidewire and a portal venogram was performed. Pressure measurements were obtained: Right atrium 25/18 (21) mmHg, portal vein 45 mmHg mean. Next,  advancement of the measuring pigtail into the main portal vein. A portal venogram was performed with a measuring Omni Flush catheter. The parenchymal track was dilated with an 8 mm Mustang Balloon, allowing advancement of the sheath into the main portal vein. Next, a 2 cm (un covered) x 6 cm (covered) x 10 mm diameter GORE VIATORR TIPS Endoprosthesis was advanced through the sheath across the intra hepatic track and Deployed. Follow-up portal venogram demonstrated good stent deployment and flow, no significant residual stenosis. No significant varices or collateral channels were identified. Pressure measurements were obtained: Right atrium 26/19 (22), portal vein 34mHg mean. Discussion with primary service regarding deflation and possible removal of Blakemore tube. During traversal of the right atrium to catheterize the splenic vein via tips, patient developed arrhythmia with hypotension. This responded to intervention. After catheter was advanced into the splenic vein, the gastric catheter was deflated and removed. Follow-up portal venography again showed good stent flow, no significant stenosis, no continued perfusion to the esophageal varices. All wires, catheters and sheaths were removed from the patient. Hemostasis was achieved at the right IJ access sites with manual compression. A dressing was placed. The patient tolerated the  procedure well. Patient transferred to the PACU. IMPRESSION: 1. Technically successful TIPS creation with decrease in mean portosystemic pressure from 24 mmHg to 7 mmHg. 2. Technically successful paracentesis under ultrasound guidance. Electronically Signed   By: Lucrezia Europe M.D.   On: 06/21/2020  15:21    Labs:  CBC: Recent Labs    07/16/20 1646 07/16/20 1949 07/17/20 0659 07/17/20 1903  WBC 6.6 8.1 12.5* 14.6*  HGB 8.3* 8.8* 9.1* 9.0*  HCT 26.1* 27.5* 28.4* 28.7*  PLT 37* 49* 63* 66*    COAGS: Recent Labs    10/15/19 0154 07/16/2020 1929 07/17/2020 1741  INR 1.2 1.2 1.5*  APTT  --  34  --     BMP: Recent Labs    10/21/19 0910 10/21/19 0910 10/22/19 0158 10/22/19 0158 10/23/19 0209 10/23/19 0209 10/27/19 1108 07/17/2020 1929 07/17/20 0056 07/17/20 0920 07/17/20 1251 07/18/20 0448  NA 133*   < > 134*   < > 135  --  136   < > 151* 148* 149* 139  K 4.2   < > 4.8   < > 4.6   < > 4.1   < > 3.1* 3.7 3.5 3.8  CL 95*   < > 95*   < > 97*   < > 96   < > 117* 116* 121* 109  CO2 26   < > 27   < > 27   < > 23   < > 21* 22 20* 19*  GLUCOSE 109*   < > 266*   < > 147*  --  235*   < > 231* 285* 254* 384*  BUN 24*   < > 24*   < > 19  --  15   < > 42* 37* 34* 34*  CALCIUM 9.2   < > 9.0   < > 9.3   < > 8.9   < > 8.9 8.8* 8.8* 8.2*  CREATININE 1.01*   < > 1.15*   < > 1.02*   < > 1.05*   < > 1.01* 0.89 0.82 0.86  GFRNONAA 51*   < > 44*   < > 51*   < > 49*   < > 55* >60 >60 >60  GFRAA 60*  --  51*  --  59*  --  57*  --   --   --   --   --    < > = values in this interval not displayed.    LIVER FUNCTION TESTS: Recent Labs    06/27/2020 1929 07/05/2020 2256 07/15/20 1009 07/16/20 0427  BILITOT 1.0 1.4* 1.4* 2.0*  AST 26 23 80* 109*  ALT 19 18 49* 78*  ALKPHOS 129* 113 88 86  PROT 5.5* 5.0* 4.8* 5.5*  ALBUMIN 2.7* 2.4* 2.6* 3.7    Assessment and Plan: Esophageal varices, hemorrhagic s/p TIPS procedure 11/25 with Dr. Serafina Royals and Dr. Vernard Gambles. Remains intubated. Remains encephalopathic.    HgB appears to have stablized at 9.0 Recommend low threshold for transfusion, and if unresponsive consider repeat CT abdomen/pelvis to evaluate for potential intraabdominal etiology.  Agree with continuation of lactulose, though regardless of bowel movements given hyperammonemia.     Palliative care team involved.    Electronically Signed: Ascencion Dike, PA-C 07/18/2020, 11:43 AM   I spent a total of 15 Minutes at the the patient's bedside AND on the patient's hospital floor or unit, greater than 50% of which was counseling/coordinating care for esophageal varices.

## 2020-07-19 DIAGNOSIS — K7581 Nonalcoholic steatohepatitis (NASH): Secondary | ICD-10-CM | POA: Diagnosis not present

## 2020-07-19 DIAGNOSIS — K7460 Unspecified cirrhosis of liver: Secondary | ICD-10-CM

## 2020-07-19 DIAGNOSIS — Z7189 Other specified counseling: Secondary | ICD-10-CM

## 2020-07-19 DIAGNOSIS — I8511 Secondary esophageal varices with bleeding: Secondary | ICD-10-CM | POA: Diagnosis not present

## 2020-07-19 LAB — CBC
HCT: 30.7 % — ABNORMAL LOW (ref 36.0–46.0)
Hemoglobin: 9.7 g/dL — ABNORMAL LOW (ref 12.0–15.0)
MCH: 30 pg (ref 26.0–34.0)
MCHC: 31.6 g/dL (ref 30.0–36.0)
MCV: 95 fL (ref 80.0–100.0)
Platelets: 86 10*3/uL — ABNORMAL LOW (ref 150–400)
RBC: 3.23 MIL/uL — ABNORMAL LOW (ref 3.87–5.11)
RDW: 17.3 % — ABNORMAL HIGH (ref 11.5–15.5)
WBC: 12 10*3/uL — ABNORMAL HIGH (ref 4.0–10.5)
nRBC: 2.2 % — ABNORMAL HIGH (ref 0.0–0.2)

## 2020-07-19 LAB — GLUCOSE, CAPILLARY
Glucose-Capillary: 291 mg/dL — ABNORMAL HIGH (ref 70–99)
Glucose-Capillary: 298 mg/dL — ABNORMAL HIGH (ref 70–99)
Glucose-Capillary: 213 mg/dL — ABNORMAL HIGH (ref 70–99)
Glucose-Capillary: 196 mg/dL — ABNORMAL HIGH (ref 70–99)
Glucose-Capillary: 256 mg/dL — ABNORMAL HIGH (ref 70–99)
Glucose-Capillary: 209 mg/dL — ABNORMAL HIGH (ref 70–99)

## 2020-07-19 LAB — BASIC METABOLIC PANEL
Anion gap: 12 (ref 5–15)
BUN: 33 mg/dL — ABNORMAL HIGH (ref 8–23)
CO2: 18 mmol/L — ABNORMAL LOW (ref 22–32)
Calcium: 8.4 mg/dL — ABNORMAL LOW (ref 8.9–10.3)
Chloride: 108 mmol/L (ref 98–111)
Creatinine, Ser: 0.79 mg/dL (ref 0.44–1.00)
GFR, Estimated: >60 mL/min (ref >60)
Glucose, Bld: 324 mg/dL — ABNORMAL HIGH (ref 70–99)
Potassium: 3.5 mmol/L (ref 3.5–5.1)
Sodium: 138 mmol/L (ref 135–145)

## 2020-07-19 LAB — MAGNESIUM: Magnesium: 2.3 mg/dL (ref 1.7–2.4)

## 2020-07-19 MED ORDER — INSULIN DETEMIR 100 UNIT/ML ~~LOC~~ SOLN
50.0000 [IU] | Freq: Two times a day (BID) | SUBCUTANEOUS | Status: DC
  Administered 2020-07-19 (×2): 50 [IU] via SUBCUTANEOUS
  Filled 2020-07-19 (×4): qty 0.5

## 2020-07-19 MED ORDER — POTASSIUM CHLORIDE 20 MEQ PO PACK
40.0000 meq | PACK | ORAL | Status: AC
  Administered 2020-07-19 (×2): 40 meq
  Filled 2020-07-19 (×2): qty 2

## 2020-07-19 MED ORDER — INSULIN ASPART 100 UNIT/ML ~~LOC~~ SOLN
5.0000 [IU] | SUBCUTANEOUS | Status: DC
  Administered 2020-07-19 – 2020-07-20 (×6): 5 [IU] via SUBCUTANEOUS

## 2020-07-19 NOTE — Progress Notes (Signed)
Inpatient Diabetes Program Recommendations  AACE/ADA: New Consensus Statement on Inpatient Glycemic Control (2015)  Target Ranges:  Prepandial:   less than 140 mg/dL      Peak postprandial:   less than 180 mg/dL (1-2 hours)      Critically ill patients:  140 - 180 mg/dL   Results for REVELLA, SHELTON (MRN [id number]) as of 07/19/2020 07:20  Ref. Range 07/18/2020 10:31 07/18/2020 11:29 07/18/2020 12:23 07/18/2020 13:19 07/18/2020 14:31 07/18/2020 15:38 07/18/2020 19:06  Glucose-Capillary Latest Ref Range: 70 - 99 mg/dL 192 (H)  IV Insulin Drip 190 (H)  IV Insulin Drip 166 (H)  IV Insulin Drip  40 units LEVEMIR @12 :40pm 162 (H)  IV Insulin Drip 167 (H)  IV Insulin Drip Stopped 208 (H)  7 units NOVOLOG  292 (H)  11 units NOVOLOG  40 units LEVEMIR @10 :04pm    Results for ANNALAYA, WILE (MRN [id number]) as of 07/19/2020 07:20  Ref. Range 07/18/2020 23:15 07/19/2020 03:11  Glucose-Capillary Latest Ref Range: 70 - 99 mg/dL 307 (H)  15 units NOVOLOG  291 (H)  11 units NOVOLOG     Home DM Meds: Lantus 30 units QHS                             Novolog 15 units BID  Current Orders: Levemir 40 units BID      Novolog Resistant Correction Scale/ SSI (0-20 units) Q4 hours     MD- Note patient transitioned to SQ Insulin from the IV Insulin drip yesterday afternoon  Tube feeds running 40cc/hr  Please consider adding Novolog Tube Feed Coverage:  Novolog 10 units Q4 hours  HOLD if tube feeds HELD for any reason  Would hold off on advancing Levemir yet as full effect of the 80 units total may not be on board just yet and this current dose is more than double the amount she takes at home    --Will follow patient during hospitalization--  Wyn Quaker RN, MSN, CDE Diabetes Coordinator Inpatient Glycemic Control Team Team Pager: [phone] (8a-5p)

## 2020-07-19 NOTE — Progress Notes (Signed)
NAME:  [name], MRN:  [id number], DOB:  [birth year], LOS: 6 ADMISSION DATE:  06/20/2020, CONSULTATION DATE:  07/19/20 REFERRING MD: EDP  , CHIEF COMPLAINT:  GIB   Brief History   86 year old female with a past medical history of Madison Lineman cirrhosis and esophageal varices COPD, CAD diabetes hyperlipidemia presented with syncope and hematemesis.  She became increasingly hypotensive with repeated hematemesis, so was intubated and plan for emergent EGD and transfusion.  History of present illness   [name] is an 86 year old female with a past medical history of Madison Lineman cirrhosis and esophageal varices COPD, CAD,  Diabetes,  hyperlipidemia who was in her usual state of health until she went to the bathroom and became lightheaded and syncopized.  When she woke up she began vomiting bright red blood.  EMS found patient hypotensive and weak.  She states she has recently had dark stools, but takes iron so this is normal for her.  Patient's last EGD was in 2016 and was diagnosed with grade 2 esophageal varices and portal hypertensive gastropathy.  She is not on aspirin, blood thinners or PPIs.  He is not sure if she is taking any OTC NSAIDs.   In the ED, patient's blood pressure initially improved with 500 cc bolus.  Hemoglobin was 11.4, this down trended to 9.7.   She also had several episodes of large-volume hematemesis and worsening hypotension so was intubated and started on Levophed.  PCCM consulted for admission and gastroenterology present to perform emergent EGD.   Past Medical History   has a past medical history of Cirrhosis, non-alcoholic ([place]), COPD (chronic obstructive pulmonary disease) ([place]), Coronary artery disease, Diabetes mellitus, Dyslipidemia, Fall, Hypotension (08/02/2010), Hypothyroidism, Leg edema, Legally blind, Myocardial infarct, old, Neuropathy of hand, Obesity, and Sleep apnea.   Significant Hospital Events   11/25 Admit to PCCM  Consults:  Gastroenterology  Procedures:    11/25 CVC by EDP 11/25 ETT, EGD/banding 11/25 [place] (since removed), emergent TIPS  Significant Diagnostic Tests:  11/24 CXR>> lungs clear and ETT in good position  Micro Data:  11/24 Covid-19 and flu>> negative  Antimicrobials:  Ceftriaxone 11/25-   Interim history/subjective:   No acute events overnight per nursing.  Transitioned to subq insulin yesterday with rise in blood sugars  No response after sedation being turned off yesterday  Objective   Blood pressure 119/72, pulse (!) 129, temperature 98.9 F (37.2 C), temperature source Axillary, resp. rate (!) 26, height 5' (1.524 m), weight 81.1 kg, SpO2 95 %.    Vent Mode: PRVC FiO2 (%):  [30 %] 30 % Set Rate:  [26 bmp] 26 bmp Vt Set:  [360 mL] 360 mL PEEP:  [5 cmH20] 5 cmH20 Plateau Pressure:  [16 cmH20-21 cmH20] 21 cmH20   Intake/Output Summary (Last 24 hours) at 07/19/2020 0854 Last data filed at 07/19/2020 0700 Gross per 24 hour  Intake 4599 ml  Output 2900 ml  Net 1699 ml   Filed Weights   07/17/20 0500 07/18/20 0500 07/19/20 0415  Weight: 74.6 kg 77.5 kg 81.1 kg   Constitutional: ill appearing woman on vent  Eyes: pupils pinpoint, reactive light Ears, nose, mouth, and throat: ETT in place, minimal secretions Cardiovascular: RRR, ext warm Respiratory: Clear, no wheezing, triggering vent Gastrointestinal: Soft, +BS Skin: No rashes, normal turgor Neurologic: [facility] Problem list     Assessment & Plan:  Hemorrhagic Shock secondary to variceal bleed, NASH cirrhosis- s/p emergent EGD and TIPS - Monitor H/H, usual transfusion threshold -  PPI  Severe hepatic encephalopathy - Continue lactulose, rifaximin - Ammonia down trending 382 to 66 on 11/28 - Sedation stopped on 11/29  Acute hypoxemic respiratory failure- on vent - VAP prevention bundle - Mental status precludes extubation  Acute kidney injury, hypernatremia- improved - Continue free water flushes  DM2 with  hyperglycemia - Transition off insulin drip 11/29 - 50 units levemir BID (increased from 40) - Aggressive sliding scale insulin - 5 units q4hr for tube coverage  Best practice (evaluated daily)   Diet: increase to goal Pain/Anxiety/Delirium protocol (if indicated): wean VAP protocol (if indicated): in place DVT prophylaxis: SCD's GI prophylaxis: protonix Glucose control: SSI Mobility: bed rest Last date of multidisciplinary goals of care discussion: Daughter [name]) updated via phone. Will update Son Quillian Quince) when he comes in this morning Family and staff present: RN, palliative  Summary of discussion: aggressive care, if does not wake up, switch to comfort Follow up goals of care discussion due 12/4 Code Status: DNR Disposition: ICU  I personally spent 40 minutes providing critical care time on this patient  Freda Jackson, MD [place]: [phone]   See Amion for Pager Details

## 2020-07-19 NOTE — Progress Notes (Signed)
Daily Progress Note   Patient Name: [name]       Date: 07/19/2020 DOB: 28-Feb-1934  Age: 86 y.o. MRN#: [id number] Attending Physician: Freddi Starr, MD Primary Care Physician: System, Provider Not In Admit Date: 07/01/2020  Reason for Follow-up: continued GOC discussion  Subjective: Reviewed chart and received report from primary RN - patient continues to be minimally responsive, does not respond to sternal rub.   Patient remains intubated and on vasopressors and amiodarone infusion. Son Quillian Quince) is at the bedside. Quillian Quince states he previously worked as a Designer, multimedia in the ICU - that he understands the situation. He states family has decided to move forward with one-way extubation tomorrow. He inquires about visitation, and I provided that we place an order for unrestricted visitation starting now. Emotional support provided.   19:15- I spoke with daughter [name] by phone and confirmed family wishes to proceed with one-way extubation tomorrow morning. I provided education and counseling on the process. Stated that we would likely need to start an infusion of pain medication prior to extubation to ensure comfort at EOL. [name] verbalizes understanding.   Length of Stay: 6  Current Medications: Scheduled Meds:  . chlorhexidine gluconate (MEDLINE KIT)  15 mL Mouth Rinse BID  . Chlorhexidine Gluconate Cloth  6 each Topical Daily  . escitalopram  20 mg Per Tube QHS  . feeding supplement (PROSource TF)  45 mL Per Tube QID  . feeding supplement (VITAL 1.5 CAL)  1,000 mL Per Tube Q24H  . free water  200 mL Per Tube Q2H  . insulin aspart  0-20 Units Subcutaneous Q4H  . insulin aspart  5 Units Subcutaneous Q4H  . insulin detemir  50 Units Subcutaneous BID  . lactulose  30 g Per Tube TID    . levothyroxine  100 mcg Per Tube Q0600  . loratadine  10 mg Per Tube Daily  . mouth rinse  15 mL Mouth Rinse 10 times per day  . montelukast  10 mg Per Tube QHS  . multivitamin with minerals  1 tablet Per Tube Daily  . pantoprazole  40 mg Intravenous Q12H  . rifaximin  550 mg Per Tube BID  . sodium chloride flush  10-40 mL Intracatheter Q12H  . sodium chloride flush  3 mL Intravenous Q12H    Continuous Infusions: .  sodium chloride 20 mL/hr at 07/19/20 0700  . amiodarone 30 mg/hr (07/19/20 1408)  . norepinephrine (LEVOPHED) Adult infusion 4 mcg/min (07/19/20 1224)  . vasopressin 0.03 Units/min (07/19/20 0700)    PRN Meds: acetaminophen (TYLENOL) oral liquid 160 mg/5 mL, docusate, iohexol, ondansetron **OR** ondansetron (ZOFRAN) IV, polyethylene glycol, sodium chloride flush  Physical Exam Vitals reviewed.  Constitutional:      Appearance: She is ill-appearing.  Cardiovascular:     Rate and Rhythm: Tachycardia present.     Comments: A-fib on monitor Pulmonary:     Comments: Intubated, ventilator Neurological:     Comments: Minimally responsive             Vital Signs: BP (!) 147/71   Pulse (!) 131   Temp 100.2 F (37.9 C) (Axillary)   Resp (!) 39   Ht 5' (1.524 m)   Wt 81.1 kg   SpO2 95%   BMI 34.92 kg/m  SpO2: SpO2: 95 % O2 Device: O2 Device: Ventilator O2 Flow Rate:    Intake/output summary:   Intake/Output Summary (Last 24 hours) at 07/19/2020 1505 Last data filed at 07/19/2020 1200 Gross per 24 hour  Intake 3245.34 ml  Output 2450 ml  Net 795.34 ml   LBM: Last BM Date: 07/19/20 Baseline Weight: Weight: 71.2 kg Most recent weight: Weight: 81.1 kg       Palliative Assessment/Data: PPS 10%        Palliative Care Assessment & Plan   Patient Profile/HPI:  86 y.o.femalewith past medical history of NASH cirrhosis with esophageal varices, COPD, CAD, blindness, DMwho was admitted on 11/24/2021with syncope and hematemesis.Shortly after  admission the patient developed further hematemesis and required intubation. EGD with banding was done but did not stop the bleeding. Emergent TIPS procedure was performed. Patient suffered hemorrhagic shock and went to the ICU intubated, on pressors, and sedated.   Assessment: - hemorrhagic shock secondary to variceal bleed, NASH cirrhosis - severe hepatic encephalopathy - acute hypoxemic respiratory failure - acute kidney injury - DM2 with hyperglycemia  Recommendations/Plan: - continue current medical treatment for now - plan for one-way extubation tomorrow morning - unrestricted visitation status today and tomorrow - PMT will be available for support with this process   Code Status: DNR/DNI  Prognosis:   poor, likely minutes to hours with extubation  Discharge Planning:  Anticipated Hospital Death   Thank you for allowing the Palliative Medicine Team to assist in the care of this patient.   Total Time 35 minutes Prolonged Time Billed  no       Greater than 50%  of this time was spent counseling and coordinating care related to the above assessment and plan.  Lavena Bullion, NP  Please contact Palliative Medicine Team phone at [phone] for questions and concerns.

## 2020-07-20 DIAGNOSIS — K7581 Nonalcoholic steatohepatitis (NASH): Secondary | ICD-10-CM | POA: Diagnosis not present

## 2020-07-20 DIAGNOSIS — I480 Paroxysmal atrial fibrillation: Secondary | ICD-10-CM

## 2020-07-20 DIAGNOSIS — K7460 Unspecified cirrhosis of liver: Secondary | ICD-10-CM | POA: Diagnosis not present

## 2020-07-20 LAB — BASIC METABOLIC PANEL
Anion gap: 11 (ref 5–15)
BUN: 32 mg/dL — ABNORMAL HIGH (ref 8–23)
CO2: 18 mmol/L — ABNORMAL LOW (ref 22–32)
Calcium: 8.6 mg/dL — ABNORMAL LOW (ref 8.9–10.3)
Chloride: 111 mmol/L (ref 98–111)
Creatinine, Ser: 0.77 mg/dL (ref 0.44–1.00)
GFR, Estimated: >60 mL/min (ref >60)
Glucose, Bld: 241 mg/dL — ABNORMAL HIGH (ref 70–99)
Potassium: 4.3 mmol/L (ref 3.5–5.1)
Sodium: 140 mmol/L (ref 135–145)

## 2020-07-20 LAB — CBC
HCT: 31.7 % — ABNORMAL LOW (ref 36.0–46.0)
Hemoglobin: 10 g/dL — ABNORMAL LOW (ref 12.0–15.0)
MCH: 30.5 pg (ref 26.0–34.0)
MCHC: 31.5 g/dL (ref 30.0–36.0)
MCV: 96.6 fL (ref 80.0–100.0)
Platelets: 116 10*3/uL — ABNORMAL LOW (ref 150–400)
RBC: 3.28 MIL/uL — ABNORMAL LOW (ref 3.87–5.11)
RDW: 18.4 % — ABNORMAL HIGH (ref 11.5–15.5)
WBC: 14 10*3/uL — ABNORMAL HIGH (ref 4.0–10.5)
nRBC: 1.9 % — ABNORMAL HIGH (ref 0.0–0.2)

## 2020-07-20 LAB — CORTISOL: Cortisol, Plasma: 5.2 ug/dL

## 2020-07-20 LAB — GLUCOSE, CAPILLARY
Glucose-Capillary: 217 mg/dL — ABNORMAL HIGH (ref 70–99)
Glucose-Capillary: 180 mg/dL — ABNORMAL HIGH (ref 70–99)

## 2020-07-20 LAB — MAGNESIUM: Magnesium: 2.4 mg/dL (ref 1.7–2.4)

## 2020-07-20 MED ORDER — MORPHINE SULFATE (PF) 2 MG/ML IV SOLN: 2.0000 mg | INTRAVENOUS | Status: DC | PRN

## 2020-07-20 MED ORDER — ACETAMINOPHEN 650 MG RE SUPP: 650.0000 mg | Freq: Four times a day (QID) | RECTAL | Status: DC | PRN

## 2020-07-20 MED ORDER — MORPHINE 100MG IN NS 100ML (1MG/ML) PREMIX INFUSION
0.0000 mg/h | INTRAVENOUS | Status: DC
  Administered 2020-07-20: 5 mg/h via INTRAVENOUS
  Filled 2020-07-20: qty 100

## 2020-07-20 MED ORDER — LORAZEPAM 2 MG/ML IJ SOLN
2.0000 mg | INTRAMUSCULAR | Status: DC | PRN
  Administered 2020-07-20: 2 mg via INTRAVENOUS
  Filled 2020-07-20: qty 1

## 2020-07-20 MED ORDER — HALOPERIDOL LACTATE 5 MG/ML IJ SOLN: 2.5000 mg | INTRAMUSCULAR | Status: DC | PRN

## 2020-07-20 MED ORDER — GLYCOPYRROLATE 0.2 MG/ML IJ SOLN: 0.2000 mg | INTRAMUSCULAR | Status: DC | PRN

## 2020-07-20 MED ORDER — POLYVINYL ALCOHOL 1.4 % OP SOLN
1.0000 [drp] | Freq: Four times a day (QID) | OPHTHALMIC | Status: DC | PRN
  Filled 2020-07-20: qty 15

## 2020-07-20 MED ORDER — ACETAMINOPHEN 325 MG PO TABS: 650.0000 mg | ORAL_TABLET | Freq: Four times a day (QID) | ORAL | Status: DC | PRN

## 2020-07-20 MED ORDER — DEXTROSE 5 % IV SOLN: INTRAVENOUS | Status: DC

## 2020-07-20 MED ORDER — GLYCOPYRROLATE 1 MG PO TABS: 1.0000 mg | ORAL_TABLET | ORAL | Status: DC | PRN

## 2020-07-20 MED ORDER — MORPHINE BOLUS VIA INFUSION
5.0000 mg | INTRAVENOUS | Status: DC | PRN
  Filled 2020-07-20: qty 5

## 2020-07-20 MED ORDER — DIPHENHYDRAMINE HCL 50 MG/ML IJ SOLN: 25.0000 mg | INTRAMUSCULAR | Status: DC | PRN

## 2020-07-20 MED ORDER — INSULIN ASPART 100 UNIT/ML ~~LOC~~ SOLN: 10.0000 [IU] | SUBCUTANEOUS | Status: DC

## 2020-07-20 DEATH — deceased

## 2020-07-21 ENCOUNTER — Other Ambulatory Visit: Payer: Self-pay

## 2020-07-21 NOTE — Patient Outreach (Addendum)
  [facility]) Care Management Chronic Special Needs Program    07/21/2020  Name: [name], DOB: 1934/01/18  MRN: [id number]  [employer] care management team has assumed care and services for this member. Case Closed by [facility] care management.  Correction:   Case closed. Client deceased as of August 11, 2020.  [facility] notified.     George Ina RN,BSN,CCM Chronic Care Management Coordinator [facility] Care Management [phone]

## 2020-08-20 NOTE — Death Summary Note (Signed)
DEATH SUMMARY   Patient Details  Name: [name] MRN: [id number] DOB: 1933/12/13  Admission/Discharge Information   Admit Date:  03-Aug-2020  Date of Death: Date of Death: 10-Aug-2020  Time of Death: Time of Death: 1457/11/04  Length of Stay: 2022/10/17  Referring Physician: System, Provider Not In   Reason(s) for Hospitalization  Gastrointestinal Bleeding  Diagnoses  Preliminary cause of death:  Secondary Diagnoses (including complications and co-morbidities):  Principal Problem:   GI bleed Active Problems:   Coronary artery disease   COPD (chronic obstructive pulmonary disease) ([place])   Obstructive sleep apnea   ILD (interstitial lung disease) ([place])   History of GI bleed   Hypotension   Hypothyroidism   Hypertension associated with diabetes ([place])   Gastroesophageal reflux disease   Liver cirrhosis secondary to NASH (HCC)   Paroxysmal atrial fibrillation (HCC)   Diabetes ([employer])   Syncope and collapse   Hyponatremia   GIB (gastrointestinal bleeding)   Hemorrhagic shock (HCC)   Esophageal varices with bleeding [facility])   Palliative care encounter   Brief Hospital Course (including significant findings, care, treatment, and services provided and events leading to death)  [name] is a 87 y.o. year old female with non-alcoholic fatty liver disease cirrhosis with esophageal varices, history of hepatic encephalopathy, coronary artery disease and diabetes presented on 08-03-20 with syncope and hematemesis. She was intubated for airway protection and had emergent EGD which was notable for large esophageal varices with active bleeding. The varices were treated with banding but subsequently continue to bleed. A [place] tube was placed for refractory bleeding and interventional radiology was consulted for TIPS procedure which was performed on 07/17/2020 with resolution of esophageal variceal bleeding. 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 Family discussions were held and the patient was transitioned to comfort care on 08-10-20 and passed away at 1459 on 10-Aug-2020.      Pertinent Labs and Studies  Significant Diagnostic Studies EEG  Result Date: 07/15/2020 Lora Havens, MD     07/15/2020  9:41 AM Patient Name: [name] MRN: [id number] Epilepsy Attending: Lora Havens Referring Physician/Provider: Dr. Ina Homes Date: 07/15/2020 Duration: 24.37 minutes Patient history: 87 year old female with altered mental status.  EEG to evaluate for seizures. Hogan of alertness: Comatose AEDs during EEG study: Propofol Technical aspects: This EEG study was done with scalp electrodes positioned according to the 10-20 International system of electrode placement. Electrical activity was acquired at a sampling rate of 500Hz  and reviewed with a high frequency filter of 70Hz  and a low frequency filter of 1Hz . EEG data were recorded continuously and digitally stored. Description: EEG showed continuous generalized background attenuation.  EEG was not reactive to tactile stimulation.  Hyperventilation and photic stimulation were not performed.   ABNORMALITY -Background attenuation, generalized IMPRESSION: This study is suggestive of profound diffuse encephalopathy, nonspecific etiology. No seizures or epileptiform discharges were seen throughout the recording. Lora Havens   DG Chest 1 View  Result Date: 07/17/2020 CLINICAL DATA:  Hypoxia EXAM: CHEST  1 VIEW COMPARISON:  July 14, 2020 FINDINGS: Endotracheal tube tip is 1.5 cm above the carina. Nasogastric tube tip and side port are below the diaphragm. No pneumothorax. There is a left pleural effusion. No edema or airspace opacity. Heart is upper normal in size with pulmonary vascularity within normal limits. No adenopathy. No bone lesions. IMPRESSION: Tube positions as described without pneumothorax. Note that the endotracheal tube tip  is fairly  close to the carina. Left pleural effusion. No edema or airspace opacity. Heart upper normal in size. Electronically Signed   By: Lowella Grip III M.D.   On: 07/17/2020 10:46   DG Abd 1 View  Result Date: 06/23/2020 CLINICAL DATA:  NG tube placement EXAM: ABDOMEN - 1 VIEW COMPARISON:  07/02/2020 FINDINGS: Interval placement of nasogastric tube with tip below the GE junction in the expected location of the body of stomach. No dilated loops of bowel identified. IMPRESSION: Interval placement of nasogastric tube with tip below the GE junction. Electronically Signed   By: Kerby Moors M.D.   On: 07/08/2020 09:55   CT ANGIO ABDOMEN W &/OR WO CONTRAST  Result Date: 07/12/2020 CLINICAL DATA:  Cirrhosis, variceal bleeding, evaluate for gastric varices, preop planning TIPS EXAM: CT ANGIOGRAPHY ABDOMEN TECHNIQUE: Multidetector CT imaging of the abdomen was performed using the standard protocol before and during bolus administration of intravenous contrast. Multiplanar reconstructed images and MIPs were obtained and reviewed to evaluate the vascular anatomy. CONTRAST:  142m OMNIPAQUE IOHEXOL 300 MG/ML  SOLN COMPARISON:  04/24/2012 FINDINGS: VASCULAR Aorta: Moderate calcified atheromatous plaque particularly in the infrarenal segment. No aneurysm, dissection, or stenosis. Celiac: Calcified ostial plaque without stenosis. Distal branching unremarkable. SMA: Patent. Replaced right hepatic arterial supply, an anatomic variant. Renals: Single left, mildly atheromatous without high-grade stenosis. Single right, patent. IMA: Patent without evidence of aneurysm, dissection, vasculitis or significant stenosis. Inflow: Visualized proximal common iliac arteries mildly atheromatous, patent without aneurysm. Veins: Patent hepatic veins, portal vein, SMV, IMV, splenic vein. Enlarged coronary vein supplies partially thrombosed esophageal varices which extend to the GE junction. No significant gastric fundal varices. No  significant splenorenal shunt. Review of the MIP images confirms the above findings. NON-VASCULAR Lower chest: No pleural or pericardial effusion. Hepatobiliary: Nodule hepatic contour without focal lesion or biliary ductal dilatation. Previous cholecystectomy. Ectatic CBD measured up to 1.1 cm diameter, seen down to the ampulla. Pancreas: Mild diffuse atrophy without mass or ductal dilatation. Spleen: Splenomegaly measured up to 13.4 cm.  No focal lesion. Adrenals/Urinary Tract: Mild bilateral adrenal hyperplasia. Bilateral renal cysts. No hydronephrosis or urolithiasis. Stomach/Bowel: Stomach is partially distended, with hyperdense intraluminal material in the fundus consistent with thrombus. Visualized small bowel and colon are nondilated. Lymphatic: Stable prominent subcentimeter aortocaval and periportal lymph nodes. No mesenteric adenopathy. Other: Small volume perihepatic and perisplenic ascites. No free air. Musculoskeletal: Small paraumbilical hernia containing only mesenteric fat. No fracture or worrisome bone lesion. IMPRESSION: 1. Cirrhosis with splenomegaly and small volume ascites. 2. Partially thrombosed esophageal varices. No significant gastric fundal varices or splenorenal shunt. Aortic Atherosclerosis (ICD10-I70.0). Electronically Signed   By: DLucrezia EuropeM.D.   On: 07/18/2020 08:09   IR Tips  Result Date: 07/05/2020 CLINICAL DATA:  Cirrhosis, portal venous hypertension, recurrent variceal bleeding despite endoscopic intervention, requiring transfusion and pressor support due to hypotension. EXAM: 1. TIPS CREATION 6 2. ULTRASOUND-GUIDED VENOUS ACCESS 3. PARACENTESIS WITH ULTRASOUND GUIDANCE ANESTHESIA/SEDATION: General - as administered by the Anesthesia department MEDICATIONS: See anesthesia record FLUOROSCOPY TIME:  25 minutes; 4485mGy COMPLICATIONS: Transient arrhythmia and hypotension during right atrial traversal, responded to interventions. SIR Hogan B: Nominal therapy (including  overnight admission for observation), no consequence. PROCEDURE: Informed written consent was obtained from the daughter (as the patient was sedated and intubated) after a thorough discussion of the procedural risks, benefits and alternatives. All questions were addressed. See previous consultation. Maximal Sterile Barrier Technique was utilized including caps, mask, sterile gowns, sterile gloves,  sterile drape, hand hygiene and skin antiseptic. A timeout was performed prior to the initiation of the procedure. The skin overlying the right upper abdominal quadrant as well as the right neck and femoral region were prepped and draped in usual sterile fashion. Initial ultrasound scanning confirms small volume perihepatic ascites. Operators: Shawnie Dapper Under ultrasound guidance, a 5 French 7 cm Yueh sheath needle was advanced into the peritoneal cavity from a right lateral approach for paracentesis removing yellow ascites. Next, the right internal jugular vein was accessed under direct ultrasound using micropuncture set. Ultrasound image was saved for procedural documentation purposes. This allowed for placement of the 10 French TIPS vascular sheath. Subsequently, the right IJ vein was accessed again under ultrasound guidance with a micropuncture set or inferiorly for placement of an 8 French long vascular sheath through which the ICE catheter was advanced into the IVC for intravascular ultrasound. With the aid of angiographic guidewires, an angled angiographic catheter was utilized to select the right middle vein and a hepatic venogram was performed. Position confirmed with IVUS. Sheath advanced into the right hepatic vein. Under IVUS  guidance, the right portal vein was accessed with a Rsch-Uchida TIPS needle. Ultimately, the right portal vein was accessed centrally at a desirable location allowing advancement of a stiff Glidewire into the main portal vein. A 4 French glide catheter was advanced over the stiff  Glidewire and a portal venogram was performed. Pressure measurements were obtained: Right atrium 25/18 (21) mmHg, portal vein 45 mmHg mean. Next,  advancement of the measuring pigtail into the main portal vein. A portal venogram was performed with a measuring Omni Flush catheter. The parenchymal track was dilated with an 8 mm Mustang Balloon, allowing advancement of the sheath into the main portal vein. Next, a 2 cm (un covered) x 6 cm (covered) x 10 mm diameter GORE VIATORR TIPS Endoprosthesis was advanced through the sheath across the intra hepatic track and Deployed. Follow-up portal venogram demonstrated good stent deployment and flow, no significant residual stenosis. No significant varices or collateral channels were identified. Pressure measurements were obtained: Right atrium 26/19 (22), portal vein 31mHg mean. Discussion with primary service regarding deflation and possible removal of Blakemore tube. During traversal of the right atrium to catheterize the splenic vein via tips, patient developed arrhythmia with hypotension. This responded to intervention. After catheter was advanced into the splenic vein, the gastric catheter was deflated and removed. Follow-up portal venography again showed good stent flow, no significant stenosis, no continued perfusion to the esophageal varices. All wires, catheters and sheaths were removed from the patient. Hemostasis was achieved at the right IJ access sites with manual compression. A dressing was placed. The patient tolerated the procedure well. Patient transferred to the PACU. IMPRESSION: 1. Technically successful TIPS creation with decrease in mean portosystemic pressure from 24 mmHg to 7 mmHg. 2. Technically successful paracentesis under ultrasound guidance. Electronically Signed   By: DLucrezia EuropeM.D.   On: 06/21/2020 15:21   DG Chest Port 1 View  Result Date: 07/12/2020 CLINICAL DATA:  Respiratory failure EXAM: PORTABLE CHEST 1 VIEW COMPARISON:  06/24/2020  FINDINGS: Endotracheal tube tip measures 1.9 cm above the carina. Shallow inspiration. Mild cardiac enlargement. No vascular congestion, edema, or consolidation. No pleural effusions. No pneumothorax. Mediastinal contours appear intact. IMPRESSION: Endotracheal tube tip measures 1.9 cm above the carina. No evidence of active pulmonary disease. Electronically Signed   By: WLucienne CapersM.D.   On: 07/03/2020 06:38   [facility]  Result Date: 06/23/2020 CLINICAL DATA:  Post intubation EXAM: PORTABLE CHEST 1 VIEW COMPARISON:  10/18/2019 FINDINGS: An endotracheal tube has been placed with tip measuring 2.8 cm above the carina. Shallow inspiration. Heart size and pulmonary vascularity are normal. Lungs are clear. No pleural effusions. No pneumothorax. Mediastinal contours appear intact. Calcification of the aorta. Degenerative changes in the spine and shoulders. IMPRESSION: Endotracheal tube tip measures 2.8 cm above the carina. No evidence of active pulmonary disease. Electronically Signed   By: Lucienne Capers M.D.   On: 06/23/2020 00:10   US LIVER DOPPLER  Result Date: 06/27/2020 CLINICAL DATA:  Cirrhosis, bleeding esophageal varices, preop planning TIPS EXAM: DUPLEX ULTRASOUND OF LIVER TECHNIQUE: Color and duplex Doppler ultrasound was performed to evaluate the hepatic in-flow and out-flow vessels. COMPARISON:  03/07/2020 and previous FINDINGS: Liver: Nodular contour. No focal lesion or biliary ductal dilatation. Main Portal Vein size: 0.9 cm Portal Vein Velocities Main Prox:  23 cm/sec Main Mid: 35 cm/sec Main Dist:    34 cm/sec Right: 39 cm/sec Left: 25 cm/sec Hepatic Vein Velocities Right:  25 cm/sec Middle:  44 cm/sec Left:  27 cm/sec IVC: Patent, velocity 46 cm/sec Hepatic Artery Velocity:  180 cm/sec Splenic Vein Velocity:  43 cm/sec Spleen: 8.2 cm x 11.4 cm x 11.4 cm with a total volume of 555 cm^3 (411 cm^3 is upper limit normal) Portal Vein Occlusion/Thrombus: None seen Splenic Vein  Occlusion/Thrombus: None seen Ascites: Small volume Varices: None identified IMPRESSION: 1. Patent portal and hepatic veins. 2. Cirrhosis with ascites and splenomegaly. Electronically Signed   By: Lucrezia Europe M.D.   On: 06/22/2020 08:14   IR Paracentesis  Result Date: 07/15/2020 CLINICAL DATA:  Cirrhosis, portal venous hypertension, recurrent variceal bleeding despite endoscopic intervention, requiring transfusion and pressor support due to hypotension. EXAM: 1. TIPS CREATION 6 2. ULTRASOUND-GUIDED VENOUS ACCESS 3. PARACENTESIS WITH ULTRASOUND GUIDANCE ANESTHESIA/SEDATION: General - as administered by the Anesthesia department MEDICATIONS: See anesthesia record FLUOROSCOPY TIME:  25 minutes; 400 mGy COMPLICATIONS: Transient arrhythmia and hypotension during right atrial traversal, responded to interventions. SIR Hogan B: Nominal therapy (including overnight admission for observation), no consequence. PROCEDURE: Informed written consent was obtained from the daughter (as the patient was sedated and intubated) after a thorough discussion of the procedural risks, benefits and alternatives. All questions were addressed. See previous consultation. Maximal Sterile Barrier Technique was utilized including caps, mask, sterile gowns, sterile gloves, sterile drape, hand hygiene and skin antiseptic. A timeout was performed prior to the initiation of the procedure. The skin overlying the right upper abdominal quadrant as well as the right neck and femoral region were prepped and draped in usual sterile fashion. Initial ultrasound scanning confirms small volume perihepatic ascites. Operators: Shawnie Dapper Under ultrasound guidance, a 5 French 7 cm Yueh sheath needle was advanced into the peritoneal cavity from a right lateral approach for paracentesis removing yellow ascites. Next, the right internal jugular vein was accessed under direct ultrasound using micropuncture set. Ultrasound image was saved for procedural  documentation purposes. This allowed for placement of the 10 French TIPS vascular sheath. Subsequently, the right IJ vein was accessed again under ultrasound guidance with a micropuncture set or inferiorly for placement of an 8 French long vascular sheath through which the ICE catheter was advanced into the IVC for intravascular ultrasound. With the aid of angiographic guidewires, an angled angiographic catheter was utilized to select the right middle vein and a hepatic venogram was performed. Position confirmed with IVUS. Sheath advanced into the right hepatic  vein. Under IVUS  guidance, the right portal vein was accessed with a Rsch-Uchida TIPS needle. Ultimately, the right portal vein was accessed centrally at a desirable location allowing advancement of a stiff Glidewire into the main portal vein. A 4 French glide catheter was advanced over the stiff Glidewire and a portal venogram was performed. Pressure measurements were obtained: Right atrium 25/18 (21) mmHg, portal vein 45 mmHg mean. Next,  advancement of the measuring pigtail into the main portal vein. A portal venogram was performed with a measuring Omni Flush catheter. The parenchymal track was dilated with an 8 mm Mustang Balloon, allowing advancement of the sheath into the main portal vein. Next, a 2 cm (un covered) x 6 cm (covered) x 10 mm diameter GORE VIATORR TIPS Endoprosthesis was advanced through the sheath across the intra hepatic track and Deployed. Follow-up portal venogram demonstrated good stent deployment and flow, no significant residual stenosis. No significant varices or collateral channels were identified. Pressure measurements were obtained: Right atrium 26/19 (22), portal vein 29mHg mean. Discussion with primary service regarding deflation and possible removal of Blakemore tube. During traversal of the right atrium to catheterize the splenic vein via tips, patient developed arrhythmia with hypotension. This responded to intervention.  After catheter was advanced into the splenic vein, the gastric catheter was deflated and removed. Follow-up portal venography again showed good stent flow, no significant stenosis, no continued perfusion to the esophageal varices. All wires, catheters and sheaths were removed from the patient. Hemostasis was achieved at the right IJ access sites with manual compression. A dressing was placed. The patient tolerated the procedure well. Patient transferred to the PACU. IMPRESSION: 1. Technically successful TIPS creation with decrease in mean portosystemic pressure from 24 mmHg to 7 mmHg. 2. Technically successful paracentesis under ultrasound guidance. Electronically Signed   By: DLucrezia EuropeM.D.   On: 07/07/2020 15:21    Microbiology Recent Results (from the past 240 hour(s))  Resp Panel by RT-PCR (Flu A&B, Covid) Nasopharyngeal Swab     Status: None   Collection Time: 07/12/2020  7:29 PM   Specimen: Nasopharyngeal Swab; Nasopharyngeal(NP) swabs in vial transport medium  Result Value Ref Range Status   SARS Coronavirus 2 by RT PCR NEGATIVE NEGATIVE Final    Comment: (NOTE) SARS-CoV-2 target nucleic acids are NOT DETECTED.  The SARS-CoV-2 RNA is generally detectable in upper respiratory specimens during the acute phase of infection. The lowest concentration of SARS-CoV-2 viral copies this assay can detect is 138 copies/mL. A negative result does not preclude SARS-Cov-2 infection and should not be used as the sole basis for treatment or other patient management decisions. A negative result may occur with  improper specimen collection/handling, submission of specimen other than nasopharyngeal swab, presence of viral mutation(s) within the areas targeted by this assay, and inadequate number of viral copies(<138 copies/mL). A negative result must be combined with clinical observations, patient history, and epidemiological information. The expected result is Negative.  Fact Sheet for Patients:   hEntrepreneurPulse.com.au Fact Sheet for Healthcare Providers:  hIncredibleEmployment.be This test is no t yet approved or cleared by the [place] and  has been authorized for detection and/or diagnosis of SARS-CoV-2 by FDA under an Emergency Use Authorization (EUA). This EUA will remain  in effect (meaning this test can be used) for the duration of the COVID-19 declaration under Section 564(b)(1) of the Act, 21 U.S.C.section 360bbb-3(b)(1), unless the authorization is terminated  or revoked sooner.       Influenza  A by PCR NEGATIVE NEGATIVE Final   Influenza B by PCR NEGATIVE NEGATIVE Final    Comment: (NOTE) The Xpert Xpress SARS-CoV-2/FLU/RSV plus assay is intended as an aid in the diagnosis of influenza from Nasopharyngeal swab specimens and should not be used as a sole basis for treatment. Nasal washings and aspirates are unacceptable for Xpert Xpress SARS-CoV-2/FLU/RSV testing.  Fact Sheet for Patients: EntrepreneurPulse.com.au  Fact Sheet for Healthcare Providers: IncredibleEmployment.be  This test is not yet approved or cleared by the [place] FDA and has been authorized for detection and/or diagnosis of SARS-CoV-2 by FDA under an Emergency Use Authorization (EUA). This EUA will remain in effect (meaning this test can be used) for the duration of the COVID-19 declaration under Section 564(b)(1) of the Act, 21 U.S.C. section 360bbb-3(b)(1), unless the authorization is terminated or revoked.  Performed at [facility], [place] [address], [place], [place] [address]   MRSA PCR Screening     Status: None   Collection Time: 07/18/2020  2:44 AM   Specimen: Nasal Mucosa; Nasopharyngeal  Result Value Ref Range Status   MRSA by PCR NEGATIVE NEGATIVE Final    Comment:        The GeneXpert MRSA Assay (FDA approved for NASAL specimens only), is one component of a comprehensive MRSA  colonization surveillance program. It is not intended to diagnose MRSA infection nor to guide or monitor treatment for MRSA infections. Performed at [facility], [place] [address], [place], [place] [address]     Lab Basic Metabolic Panel: Recent Labs  Lab 07/17/20 0920 07/17/20 1251 07/18/20 0448 07/19/20 0311 07/19/20 0814 2020-08-09 0247  NA 148* 149* 139 138  --  140  K 3.7 3.5 3.8 3.5  --  4.3  CL 116* 121* 109 108  --  111  CO2 22 20* 19* 18*  --  18*  GLUCOSE 285* 254* 384* 324*  --  241*  BUN 37* 34* 34* 33*  --  32*  CREATININE 0.89 0.82 0.86 0.79  --  0.77  CALCIUM 8.8* 8.8* 8.2* 8.4*  --  8.6*  MG  --   --   --   --  2.3 2.4   Liver Function Tests: Recent Labs  Lab 07/16/20 0427  AST 109*  ALT 78*  ALKPHOS 86  BILITOT 2.0*  PROT 5.5*  ALBUMIN 3.7   No results for input(s): LIPASE, AMYLASE in the last 168 hours. Recent Labs  Lab 07/17/20 0919  AMMONIA 66*   CBC: Recent Labs  Lab 07/16/20 1949 07/17/20 0659 07/17/20 1903 07/19/20 0814 2020-08-09 0247  WBC 8.1 12.5* 14.6* 12.0* 14.0*  HGB 8.8* 9.1* 9.0* 9.7* 10.0*  HCT 27.5* 28.4* 28.7* 30.7* 31.7*  MCV 94.8 96.3 96.6 95.0 96.6  PLT 49* 63* 66* 86* 116*   Cardiac Enzymes: No results for input(s): CKTOTAL, CKMB, CKMBINDEX, TROPONINI in the last 168 hours. Sepsis Labs: Recent Labs  Lab 07/17/20 0659 07/17/20 1903 07/19/20 0814 August 09, 2020 0247  WBC 12.5* 14.6* 12.0* 14.0*    Procedures/Operations  Endotracheal Intubation EDG TIPS   Freddi Starr 07/22/2020, 11:59 AM

## 2020-08-20 NOTE — Progress Notes (Signed)
Nutrition Brief Note  Chart reviewed. Pt now transitioning to comfort care.  No further nutrition interventions warranted at this time.  Please re-consult as needed.   Lucas Mallow, RD, LDN, CNSC Please refer to [facility] for contact information.

## 2020-08-20 NOTE — Progress Notes (Signed)
Pt passed away peacefully at 1459 surrounded by her daughter and son.  This RN and Loma Newton auscultated for heart beat for 1 full minute.  Dr. Erin Fulling was notified  [facility] referral number is  [phone] and spoke with Adele Barthel at [facility].   Irven Baltimore, RN

## 2020-08-20 NOTE — Progress Notes (Signed)
This chaplain joined the PMT with providing EOL spiritual care for the Pt., Pt. daughter-Andrea, and Pt. Son-Daniel at the bedside.  The chaplain understands the family has found spiritual and emotional peace in the decision for the Pt. one-way extubation.   The chaplain joined the family in the sacred space of story telling and honoring the Pt. EOL wishes. The chaplain's invitation for prayer was accepted and reflected in the time together.  The chaplain shared her availability for F/U spiritual care as needed.

## 2020-08-20 NOTE — Progress Notes (Signed)
NAME:  [name], MRN:  [id number], DOB:  [birth year], LOS: 7 ADMISSION DATE:  06/26/2020, CONSULTATION DATE:  07/25/20 REFERRING MD: EDP  , CHIEF COMPLAINT:  GIB   Brief History   87 year old female with a past medical history of [name] cirrhosis and esophageal varices COPD, CAD diabetes hyperlipidemia presented with syncope and hematemesis.  She became increasingly hypotensive with repeated hematemesis, so was intubated and plan for emergent EGD and transfusion.  History of present illness   [name] is an 87 year old female with a past medical history of [name] cirrhosis and esophageal varices COPD, CAD,  Diabetes,  hyperlipidemia who was in her usual state of health until she went to the bathroom and became lightheaded and syncopized.  When she woke up she began vomiting bright red blood.  EMS found patient hypotensive and weak.  She states she has recently had dark stools, but takes iron so this is normal for her.  Patient's last EGD was in 2016 and was diagnosed with grade 2 esophageal varices and portal hypertensive gastropathy.  She is not on aspirin, blood thinners or PPIs.  He is not sure if she is taking any OTC NSAIDs.   In the ED, patient's blood pressure initially improved with 500 cc bolus.  Hemoglobin was 11.4, this down trended to 9.7.   She also had several episodes of large-volume hematemesis and worsening hypotension so was intubated and started on Levophed.  PCCM consulted for admission and gastroenterology present to perform emergent EGD.   Past Medical History   has a past medical history of Cirrhosis, non-alcoholic ([place]), COPD (chronic obstructive pulmonary disease) ([place]), Coronary artery disease, Diabetes mellitus, Dyslipidemia, Fall, Hypotension (08/02/2010), Hypothyroidism, Leg edema, Legally blind, Myocardial infarct, old, Neuropathy of hand, Obesity, and Sleep apnea.   Significant Hospital Events   11/25 Admit to PCCM  Consults:  Gastroenterology  Procedures:    11/25 CVC by EDP 11/25 ETT, EGD/banding 11/25 [place] (since removed), emergent TIPS  Significant Diagnostic Tests:  11/24 CXR>> lungs clear and ETT in good position  Micro Data:  11/24 Covid-19 and flu>> negative  Antimicrobials:  Ceftriaxone 11/25-   Interim history/subjective:   No acute events overnight per nursing.  Patient's family is at the bedside this morning and have made decision to transition to comfort care  Objective   Blood pressure 111/88, pulse (!) 123, temperature 99.5 F (37.5 C), temperature source Axillary, resp. rate (!) 24, height 5' (1.524 m), weight 82.2 kg, SpO2 96 %.    Vent Mode: PRVC FiO2 (%):  [30 %] 30 % Set Rate:  [26 bmp] 26 bmp Vt Set:  [360 mL] 360 mL PEEP:  [5 cmH20] 5 cmH20 Plateau Pressure:  [19 cmH20-22 cmH20] 22 cmH20   Intake/Output Summary (Last 24 hours) at 07/25/2020 1324 Last data filed at Jul 25, 2020 1100 Gross per 24 hour  Intake 1518.11 ml  Output 2325 ml  Net -806.89 ml   Filed Weights   07/18/20 0500 07/19/20 0415 07-25-2020 0252  Weight: 77.5 kg 81.1 kg 82.2 kg   Constitutional: ill appearing woman on vent  Eyes: pupils pinpoint, reactive light, roving present Ears, nose, mouth, and throat: ETT in place, minimal secretions Cardiovascular: RRR, ext warm Respiratory: Clear, no wheezing, triggering vent Gastrointestinal: Soft, +BS Skin: No rashes, normal turgor Neurologic: [facility] Problem list     Assessment & Plan:  Hemorrhagic Shock secondary to variceal bleed, NASH cirrhosis- s/p emergent EGD and TIPS - Monitor H/H, usual transfusion threshold - PPI  Severe hepatic encephalopathy - Continue lactulose, rifaximin - Ammonia down trending 382 to 66 on 11/28 - Sedation stopped on 11/29  Acute hypoxemic respiratory failure- on vent - VAP prevention bundle - Mental status precludes extubation  Acute kidney injury, hypernatremia- improved - Continue free water flushes  DM2 with  hyperglycemia - Transition off insulin drip 11/29 - 50 units levemir BID (increased from 40) - Aggressive sliding scale insulin - 10 units q4hr for tube coverage  Disposition: Comfort measure orders have been placed. Once the narcotic drip has been titrated to effect then we will remove life support measures and liberate patient from the ventilator.  Best practice (evaluated daily)   Diet: increase to goal Pain/Anxiety/Delirium protocol (if indicated): wean VAP protocol (if indicated): in place DVT prophylaxis: SCD's GI prophylaxis: protonix Glucose control: SSI Mobility: bed rest Family and staff present: Son, Presenter, broadcasting, MD, RN Summary of discussion: Move to comfort measures only Follow up goals of care discussion due 12/1 Code Status: DNR Disposition: ICU  Freda Jackson, MD [place] Office: [phone]   See Amion for Pager Details

## 2020-08-20 NOTE — Procedures (Signed)
Extubation Procedure Note  Patient Details:   Name: [name] DOB: 1934/06/12 MRN: [id number]   Airway Documentation:    Vent end date: 07-30-20 Vent end time: 1115   Evaluation  O2 sats: terminal extubation  Complications: No apparent complications Patient did tolerate procedure well. Bilateral Breath Sounds: Rhonchi, Coarse crackles   No  Pt was extubated terminally per MD order.   Ronaldo Miyamoto 07-30-2020, 11:18 AM

## 2020-08-20 NOTE — Progress Notes (Signed)
Daily Progress Note   Patient Name: Shade Rivenbark       Date: 08/03/20 DOB: [birth year]  Age: 87 y.o. MRN#: [id number] Attending Physician: Freddi Starr, MD Primary Care Physician: System, Provider Not In Admit Date: 07/08/2020  Reason for Follow-up: terminal care,  Psychosocial/spiritual support  Subjective: Daughter [name] and son Quillian Quince are at bedside. They are ready to proceed with one-way extubation. Education and counseling provided on this process as well as expectations at EOL. Discussed morphine infusion would be started and titrated for comfort. Emotional support provided.    PMT chaplain is also at bedside to provide psychosocial and spiritual support for family.   On follow-up assessment, patient appears comfortable on morphine infusion.   Length of Stay: 7  Current Medications: Scheduled Meds:  . chlorhexidine gluconate (MEDLINE KIT)  15 mL Mouth Rinse BID  . mouth rinse  15 mL Mouth Rinse 10 times per day  . sodium chloride flush  10-40 mL Intracatheter Q12H  . sodium chloride flush  3 mL Intravenous Q12H    Continuous Infusions: . sodium chloride 20 mL/hr at 08-03-2020 0600  . amiodarone 30 mg/hr (08/03/2020 0600)  . dextrose    . morphine    . norepinephrine (LEVOPHED) Adult infusion 3 mcg/min (08-03-20 0600)  . vasopressin 0.03 Units/min (2020-08-03 0600)    PRN Meds: acetaminophen **OR** acetaminophen, diphenhydrAMINE, glycopyrrolate **OR** glycopyrrolate **OR** glycopyrrolate, haloperidol lactate, iohexol, LORazepam, morphine injection, morphine, ondansetron **OR** ondansetron (ZOFRAN) IV, polyethylene glycol, polyvinyl alcohol, sodium chloride flush  Physical Exam Vitals reviewed.  Constitutional:      Appearance: She is ill-appearing.    Cardiovascular:     Rate and Rhythm: Tachycardia present.  Pulmonary:     Comments: Intubated, ventilated Neurological:     Comments: Minimally responsive             Vital Signs: BP (!) 133/50   Pulse (!) 144   Temp 99.5 F (37.5 C) (Axillary)   Resp (!) 28   Ht 5' (1.524 m)   Wt 82.2 kg   SpO2 95%   BMI 35.39 kg/m  SpO2: SpO2: 95 % O2 Device: O2 Device: Ventilator O2 Flow Rate:    Intake/output summary:   Intake/Output Summary (Last 24 hours) at August 03, 2020 0954 Last data filed at 08/03/20 0600 Gross per 24 hour  Intake 1573.37 ml  Output 2725 ml  Net -1151.63 ml   LBM: Last BM Date: 07/19/20 Baseline Weight: Weight: 71.2 kg Most recent weight: Weight: 82.2 kg       Palliative Assessment/Data: PPS 10%        Palliative Care Assessment & Plan   Patient Profile/HPI:87 y.o.femalewith past medical history of NASH cirrhosis with esophageal varices, COPD, CAD, blindness, DMwho was admitted on 11/24/2021with syncope and hematemesis.Shortly after admission the patient developed further hematemesis and required intubation. EGD with banding was done but did not stop the bleeding. Emergent TIPS procedure was performed. Patient suffered hemorrhagic shock and went to the ICU intubated, on pressors, and sedated.   Assessment: - hemorrhagic shock secondary to variceal bleed, NASH cirrhosis - severe hepatic encephalopathy - acute hypoxemic respiratory failure - acute kidney injury - DM2 with hyperglycemia  Recommendations/Plan: - patient transitioned to full comfort care - d/c tube feeds - start morphine infusion and titrate for comfort up to 20 mg/hr - once comfort is ensured, proceed with one-way extubation  - after extubation, wean off levophed, amiodarone, and vasopressin  Goals of Care and Additional Recommendations:  Limitations on Scope of Treatment: Full Comfort Care  Code Status:  DNR/DNI  Prognosis:  Hours   Discharge  Planning:  Anticipated hospital death  Care plan was discussed with nursing, spiritual care  Thank you for allowing the Palliative Medicine Team to assist in the care of this patient.   Total Time 25 minutes Prolonged Time Billed  no      Greater than 50%  of this time was spent counseling and coordinating care related to the above assessment and plan.  Lavena Bullion, NP  Please contact Palliative Medicine Team phone at [phone] for questions and concerns.

## 2020-08-20 NOTE — Progress Notes (Signed)
This chaplain was present for F/U spiritual care with the Pt. family.  The family is peacefully present bedside honoring the Pt. EOL journey.

## 2020-08-20 DEATH — deceased

## 2020-08-24 NOTE — Telephone Encounter (Signed)
This encounter was created in error - please disregard.

## 2020-09-09 ENCOUNTER — Ambulatory Visit: Payer: HMO | Admitting: Physician Assistant

## 2020-10-19 ENCOUNTER — Ambulatory Visit: Payer: HMO
